# Patient Record
Sex: Female | Born: 1935 | Race: White | Hispanic: No | Marital: Married | Smoking: Former smoker
Health system: Southern US, Community
[De-identification: ages and names within clinical notes are randomized; demographics above are authoritative.]

## PROBLEM LIST (undated history)

## (undated) DIAGNOSIS — Z8719 Personal history of other diseases of the digestive system: Secondary | ICD-10-CM

## (undated) DIAGNOSIS — I10 Essential (primary) hypertension: Secondary | ICD-10-CM

## (undated) DIAGNOSIS — R269 Unspecified abnormalities of gait and mobility: Secondary | ICD-10-CM

## (undated) DIAGNOSIS — E7800 Pure hypercholesterolemia, unspecified: Secondary | ICD-10-CM

## (undated) DIAGNOSIS — M179 Osteoarthritis of knee, unspecified: Secondary | ICD-10-CM

## (undated) DIAGNOSIS — K219 Gastro-esophageal reflux disease without esophagitis: Secondary | ICD-10-CM

## (undated) DIAGNOSIS — F329 Major depressive disorder, single episode, unspecified: Secondary | ICD-10-CM

## (undated) DIAGNOSIS — D509 Iron deficiency anemia, unspecified: Secondary | ICD-10-CM

## (undated) DIAGNOSIS — F32A Depression, unspecified: Secondary | ICD-10-CM

## (undated) DIAGNOSIS — B0230 Zoster ocular disease, unspecified: Secondary | ICD-10-CM

## (undated) DIAGNOSIS — G4700 Insomnia, unspecified: Secondary | ICD-10-CM

## (undated) DIAGNOSIS — M5430 Sciatica, unspecified side: Secondary | ICD-10-CM

## (undated) DIAGNOSIS — M1710 Unilateral primary osteoarthritis, unspecified knee: Secondary | ICD-10-CM

## (undated) HISTORY — DX: Iron deficiency anemia, unspecified: D50.9

## (undated) HISTORY — PX: JOINT REPLACEMENT: SHX530

## (undated) HISTORY — DX: Zoster ocular disease, unspecified: B02.30

## (undated) HISTORY — DX: Depression, unspecified: F32.A

## (undated) HISTORY — DX: Essential (primary) hypertension: I10

## (undated) HISTORY — PX: TONSILLECTOMY: SUR1361

## (undated) HISTORY — PX: COLONOSCOPY: SHX174

## (undated) HISTORY — DX: Unspecified abnormalities of gait and mobility: R26.9

## (undated) HISTORY — DX: Major depressive disorder, single episode, unspecified: F32.9

## (undated) HISTORY — PX: EYE SURGERY: SHX253

## (undated) HISTORY — PX: HERNIA REPAIR: SHX51

---

## 1993-06-24 HISTORY — PX: ABDOMINAL HYSTERECTOMY: SHX81

## 1998-04-10 ENCOUNTER — Ambulatory Visit (HOSPITAL_BASED_OUTPATIENT_CLINIC_OR_DEPARTMENT_OTHER): Admission: RE | Admit: 1998-04-10 | Discharge: 1998-04-10 | Payer: Self-pay | Admitting: Orthopedic Surgery

## 1999-07-06 ENCOUNTER — Other Ambulatory Visit: Admission: RE | Admit: 1999-07-06 | Discharge: 1999-07-06 | Payer: Self-pay | Admitting: Internal Medicine

## 1999-12-31 ENCOUNTER — Ambulatory Visit (HOSPITAL_COMMUNITY): Admission: RE | Admit: 1999-12-31 | Discharge: 1999-12-31 | Payer: Self-pay | Admitting: Internal Medicine

## 1999-12-31 ENCOUNTER — Encounter: Admission: RE | Admit: 1999-12-31 | Discharge: 1999-12-31 | Payer: Self-pay | Admitting: Orthopedic Surgery

## 1999-12-31 ENCOUNTER — Encounter: Payer: Self-pay | Admitting: Orthopedic Surgery

## 2000-01-01 ENCOUNTER — Ambulatory Visit (HOSPITAL_BASED_OUTPATIENT_CLINIC_OR_DEPARTMENT_OTHER): Admission: RE | Admit: 2000-01-01 | Discharge: 2000-01-02 | Payer: Self-pay | Admitting: Orthopedic Surgery

## 2000-01-01 HISTORY — PX: SHOULDER ARTHROSCOPY WITH ROTATOR CUFF REPAIR AND SUBACROMIAL DECOMPRESSION: SHX5686

## 2000-03-04 ENCOUNTER — Encounter: Admission: RE | Admit: 2000-03-04 | Discharge: 2000-03-04 | Payer: Self-pay | Admitting: Internal Medicine

## 2000-03-04 ENCOUNTER — Encounter: Payer: Self-pay | Admitting: Internal Medicine

## 2000-06-27 ENCOUNTER — Ambulatory Visit (HOSPITAL_COMMUNITY): Admission: RE | Admit: 2000-06-27 | Discharge: 2000-06-27 | Payer: Self-pay | Admitting: Gastroenterology

## 2001-02-17 ENCOUNTER — Inpatient Hospital Stay (HOSPITAL_COMMUNITY): Admission: EM | Admit: 2001-02-17 | Discharge: 2001-02-18 | Payer: Self-pay | Admitting: Emergency Medicine

## 2001-02-17 ENCOUNTER — Encounter: Payer: Self-pay | Admitting: Internal Medicine

## 2001-03-30 ENCOUNTER — Encounter: Payer: Self-pay | Admitting: Surgery

## 2001-03-30 ENCOUNTER — Encounter: Admission: RE | Admit: 2001-03-30 | Discharge: 2001-03-30 | Payer: Self-pay | Admitting: Surgery

## 2001-05-11 ENCOUNTER — Ambulatory Visit (HOSPITAL_BASED_OUTPATIENT_CLINIC_OR_DEPARTMENT_OTHER): Admission: RE | Admit: 2001-05-11 | Discharge: 2001-05-11 | Payer: Self-pay | Admitting: Orthopedic Surgery

## 2001-05-11 HISTORY — PX: KNEE ARTHROSCOPY: SHX127

## 2001-07-03 ENCOUNTER — Inpatient Hospital Stay (HOSPITAL_COMMUNITY): Admission: RE | Admit: 2001-07-03 | Discharge: 2001-07-06 | Payer: Self-pay | Admitting: Surgery

## 2001-07-03 ENCOUNTER — Encounter (INDEPENDENT_AMBULATORY_CARE_PROVIDER_SITE_OTHER): Payer: Self-pay | Admitting: Specialist

## 2001-07-03 HISTORY — PX: LAPAROSCOPIC NISSEN FUNDOPLICATION: SHX1932

## 2001-07-04 ENCOUNTER — Encounter: Payer: Self-pay | Admitting: Surgery

## 2002-02-18 ENCOUNTER — Encounter: Payer: Self-pay | Admitting: Internal Medicine

## 2002-02-18 ENCOUNTER — Ambulatory Visit (HOSPITAL_COMMUNITY): Admission: RE | Admit: 2002-02-18 | Discharge: 2002-02-18 | Payer: Self-pay | Admitting: Internal Medicine

## 2003-08-24 ENCOUNTER — Encounter: Admission: RE | Admit: 2003-08-24 | Discharge: 2003-08-24 | Payer: Self-pay | Admitting: Internal Medicine

## 2004-05-14 ENCOUNTER — Ambulatory Visit: Payer: Self-pay | Admitting: Physical Medicine & Rehabilitation

## 2004-05-14 ENCOUNTER — Inpatient Hospital Stay (HOSPITAL_COMMUNITY): Admission: RE | Admit: 2004-05-14 | Discharge: 2004-05-19 | Payer: Self-pay | Admitting: Orthopedic Surgery

## 2004-05-14 HISTORY — PX: TOTAL KNEE ARTHROPLASTY: SHX125

## 2007-04-08 ENCOUNTER — Other Ambulatory Visit: Admission: RE | Admit: 2007-04-08 | Discharge: 2007-04-08 | Payer: Self-pay | Admitting: Internal Medicine

## 2007-07-17 ENCOUNTER — Encounter: Admission: RE | Admit: 2007-07-17 | Discharge: 2007-07-17 | Payer: Self-pay | Admitting: Orthopedic Surgery

## 2007-07-21 ENCOUNTER — Ambulatory Visit (HOSPITAL_BASED_OUTPATIENT_CLINIC_OR_DEPARTMENT_OTHER): Admission: RE | Admit: 2007-07-21 | Discharge: 2007-07-21 | Payer: Self-pay | Admitting: Orthopedic Surgery

## 2007-07-21 HISTORY — PX: CARPAL TUNNEL RELEASE: SHX101

## 2007-07-21 HISTORY — PX: TRIGGER FINGER RELEASE: SHX641

## 2007-09-24 ENCOUNTER — Ambulatory Visit (HOSPITAL_BASED_OUTPATIENT_CLINIC_OR_DEPARTMENT_OTHER): Admission: RE | Admit: 2007-09-24 | Discharge: 2007-09-24 | Payer: Self-pay | Admitting: Orthopedic Surgery

## 2007-09-24 HISTORY — PX: CARPAL TUNNEL RELEASE: SHX101

## 2007-09-24 HISTORY — PX: TRIGGER FINGER RELEASE: SHX641

## 2008-03-10 ENCOUNTER — Ambulatory Visit (HOSPITAL_BASED_OUTPATIENT_CLINIC_OR_DEPARTMENT_OTHER): Admission: RE | Admit: 2008-03-10 | Discharge: 2008-03-10 | Payer: Self-pay | Admitting: Orthopedic Surgery

## 2008-03-10 HISTORY — PX: TRIGGER FINGER RELEASE: SHX641

## 2010-08-13 ENCOUNTER — Other Ambulatory Visit: Payer: Self-pay | Admitting: Neurosurgery

## 2010-08-13 DIAGNOSIS — M545 Low back pain: Secondary | ICD-10-CM

## 2010-08-15 ENCOUNTER — Ambulatory Visit
Admission: RE | Admit: 2010-08-15 | Discharge: 2010-08-15 | Disposition: A | Discharge status: Home / Self Care | Payer: Medicare Other | Source: Ambulatory Visit | Attending: Neurosurgery | Admitting: Neurosurgery

## 2010-08-15 DIAGNOSIS — M545 Low back pain: Secondary | ICD-10-CM

## 2010-08-15 MED ORDER — GADOBENATE DIMEGLUMINE 529 MG/ML IV SOLN
13.0000 mL | Freq: Once | INTRAVENOUS | Status: AC | PRN
  Administered 2010-08-15: 13 mL via INTRAVENOUS

## 2010-08-27 ENCOUNTER — Ambulatory Visit (HOSPITAL_COMMUNITY)
Admission: RE | Admit: 2010-08-27 | Discharge: 2010-08-27 | Disposition: A | Discharge status: Home / Self Care | Payer: Medicare Other | Source: Ambulatory Visit | Attending: Neurosurgery | Admitting: Neurosurgery

## 2010-08-27 ENCOUNTER — Encounter (HOSPITAL_COMMUNITY)
Admission: RE | Admit: 2010-08-27 | Discharge: 2010-08-27 | Disposition: A | Discharge status: Home / Self Care | Payer: Medicare Other | Source: Ambulatory Visit | Attending: Neurosurgery | Admitting: Neurosurgery

## 2010-08-27 ENCOUNTER — Other Ambulatory Visit (HOSPITAL_COMMUNITY): Payer: Self-pay | Admitting: Neurosurgery

## 2010-08-27 DIAGNOSIS — Z01818 Encounter for other preprocedural examination: Secondary | ICD-10-CM | POA: Insufficient documentation

## 2010-08-27 DIAGNOSIS — Z01810 Encounter for preprocedural cardiovascular examination: Secondary | ICD-10-CM | POA: Insufficient documentation

## 2010-08-27 DIAGNOSIS — Z01812 Encounter for preprocedural laboratory examination: Secondary | ICD-10-CM | POA: Insufficient documentation

## 2010-08-27 DIAGNOSIS — M48061 Spinal stenosis, lumbar region without neurogenic claudication: Secondary | ICD-10-CM

## 2010-08-27 LAB — CBC
HCT: 38.6 % (ref 36.0–46.0)
Hemoglobin: 13.3 g/dL (ref 12.0–15.0)
MCH: 31.9 pg (ref 26.0–34.0)
MCHC: 34.5 g/dL (ref 30.0–36.0)
MCV: 92.6 fL (ref 78.0–100.0)
Platelets: 311 10*3/uL (ref 150–400)
RBC: 4.17 MIL/uL (ref 3.87–5.11)
RDW: 12.6 % (ref 11.5–15.5)
WBC: 8.1 10*3/uL (ref 4.0–10.5)

## 2010-08-27 LAB — BASIC METABOLIC PANEL
BUN: 13 mg/dL (ref 6–23)
CO2: 25 mEq/L (ref 19–32)
Calcium: 9.5 mg/dL (ref 8.4–10.5)
Chloride: 101 mEq/L (ref 96–112)
Creatinine, Ser: 0.63 mg/dL (ref 0.4–1.2)
GFR calc Af Amer: >60 mL/min (ref >60)
GFR calc non Af Amer: >60 mL/min (ref >60)
Glucose, Bld: 113 mg/dL — ABNORMAL HIGH (ref 70–99)
Potassium: 4.8 mEq/L (ref 3.5–5.1)
Sodium: 136 mEq/L (ref 135–145)

## 2010-08-27 LAB — SURGICAL PCR SCREEN
MRSA, PCR: NEGATIVE
Staphylococcus aureus: NEGATIVE

## 2010-08-29 ENCOUNTER — Inpatient Hospital Stay (HOSPITAL_COMMUNITY): Payer: Medicare Other

## 2010-08-29 ENCOUNTER — Inpatient Hospital Stay (HOSPITAL_COMMUNITY)
Admission: RE | Admit: 2010-08-29 | Discharge: 2010-09-04 | DRG: 491 | Disposition: A | Discharge status: Skilled Nursing Facility | Payer: Medicare Other | Source: Ambulatory Visit | Attending: Neurosurgery | Admitting: Neurosurgery

## 2010-08-29 DIAGNOSIS — M51379 Other intervertebral disc degeneration, lumbosacral region without mention of lumbar back pain or lower extremity pain: Principal | ICD-10-CM | POA: Diagnosis present

## 2010-08-29 DIAGNOSIS — M48062 Spinal stenosis, lumbar region with neurogenic claudication: Secondary | ICD-10-CM | POA: Diagnosis present

## 2010-08-29 DIAGNOSIS — I10 Essential (primary) hypertension: Secondary | ICD-10-CM | POA: Diagnosis present

## 2010-08-29 DIAGNOSIS — M5137 Other intervertebral disc degeneration, lumbosacral region: Principal | ICD-10-CM | POA: Diagnosis present

## 2010-08-29 DIAGNOSIS — K219 Gastro-esophageal reflux disease without esophagitis: Secondary | ICD-10-CM | POA: Diagnosis present

## 2010-08-29 HISTORY — PX: LUMBAR LAMINECTOMY/DECOMPRESSION MICRODISCECTOMY: SHX5026

## 2010-08-31 LAB — URINALYSIS, ROUTINE W REFLEX MICROSCOPIC
Bilirubin Urine: NEGATIVE
Glucose, UA: NEGATIVE mg/dL
Ketones, ur: NEGATIVE mg/dL
Nitrite: NEGATIVE
Protein, ur: NEGATIVE mg/dL
Specific Gravity, Urine: 1.023 (ref 1.005–1.030)
Urobilinogen, UA: 0.2 mg/dL (ref 0.0–1.0)
pH: 5.5 (ref 5.0–8.0)

## 2010-08-31 LAB — URINE MICROSCOPIC-ADD ON

## 2010-09-01 LAB — URINE CULTURE
Colony Count: NO GROWTH
Culture  Setup Time: 201203091512
Culture: NO GROWTH
Special Requests: NEGATIVE

## 2010-09-05 NOTE — H&P (Signed)
NAME:  Kristin Solomon, Kristin Solomon           ACCOUNT NO.:  [id number]  MEDICAL RECORD NO.:  [id number]           PATIENT TYPE:  I  LOCATION:  3013                         FACILITY:  [facility]  PHYSICIAN:  Hewitt Shorts, M.D.DATE OF BIRTH:  1929-07-08  DATE OF ADMISSION:  08/29/2010 DATE OF DISCHARGE:                             HISTORY & PHYSICAL   HISTORY OF PRESENT ILLNESS:  The patient is a 77 year old right-hand white female whom I have followed for the past year and half for neurogenic claudication whom we first saw on August 2010.  She returned last month because of worsening neurogenic claudication with pain in the buttocks and posterior thighs bilaterally.  She does not have a lot of back pain.  She was treated with prednisone Dosepak without relief, treated with 2 epidural steroid injections which gave her no relief, treated with anti-inflammatory medications without improvement and so she was restarted with an updated MRI scan which shows multilevel multifactorial lumbar stenosis, moderate at L2-3 and L3-4, marked to severe at L4-5, and marked at L5-S1 and shows mild anterolisthesis of L3- 4 and L4-5.  The patient admitted now for decompressive lumbar laminectomy.  PAST MEDICAL HISTORY:  Notable for history of iron-deficiency anemia as well as hiatal hernia.  She had surgical repair of the hiatal hernia in 2003.  She does not describe any history of hypertension, myocardial infarction, cancer, stroke, diabetes, or lung disease.  PREVIOUS SURGICAL HISTORY:  Vaginal hysterectomy in 1996, repair of hiatal hernia in 2003, left knee replacement in 2005.  She denies allergy to medications.  CURRENT MEDICATIONS: 1. Ferrous sulfate 325 mg daily. 2. Muro 128 ophthalmologic 5% ointment in the eyes b.i.d. 3. Venlafaxine 75 mg at bedtime. 4. Prilosec 20 mg b.i.d. 5. Vitamin E 400 international units q.a.m. 6. Aspirin 81 mg q.a.m. 7. Lopressor 25 mg b.i.d. 8. Crestor 10 mg  q.a.m.  FAMILY HISTORY:  Parents have passed on.  Father with Parkinson's, died at age 84.  Mother died at 65.  SOCIAL HISTORY:  The patient is married.  She is retired.  She does not smoke, drink alcoholic beverages or has history of substance abuse.  REVIEW OF SYSTEMS:  Notable for those described in the history of present illness and past medical history, but 14-point review of systems is otherwise unremarkable.  PHYSICAL EXAMINATION:  GENERAL:  The patient is a well-developed, well- nourished white female in no acute distress. VITAL SIGNS:  Temperature 98, pulse 86, blood pressure 133/80, respiratory rate 20, height 5 feet 2 inches, weight 142 pounds. LUNGS:  Clear to auscultation.  She has symmetric respiratory excursions. HEART:  Regular rate and rhythm with S1 and S2.  There is no murmur. EXTREMITIES:  No clubbing, cyanosis, or edema. MUSCULOSKELETAL:  No tenderness to palpation over the lumbar spinous process or paralumbar musculature.  She is able to flex to 90 degrees, able to extend to 10 degrees.  Straight-leg raising is negative bilaterally. NEUROLOGIC:  Strength 5/5 in the lower extremities including the iliopsoas, quadriceps, dorsiflexors, extensor hallucis longus, and plantar flexion bilaterally.  Sensation is intact to pinprick to the upper and lower extremities.  Reflexes are 2 at  the quadriceps, 1 at the gastrocnemius symmetrical bilaterally.  Toes are downgoing bilaterally. She has a normal gait and stance.  IMPRESSION:  Multilevel multifactorial lumbar stenosis with resulting neurogenic claudication in a patient with spondylosis and degenerative disk disease.  PLAN:  The patient will be admitted for a multilevel decompressive lumbar laminectomy.  We discussed alternatives to surgery, nature of surgical procedure, typical length of surgery, hospital stay, and overall recuperation and limitations postoperatively, recommendation for postoperative  immobilization in a lumbar brace and risks of including risks of infection, bleeding, possible need for transfusion, risk of nerve dysfunction with pain, weakness, numbness, or paresthesias, the risk of dural tear and CSF leakage, possible need of further surgery, the risk of instability of spine and possible need for further surgery and anesthetic risk, myocardial infarction, stroke, pneumonia, and death.  Understanding all of this, she would like to go ahead with surgery and is admitted for such.     Hewitt Shorts, M.D.     RWN/MEDQ  D:  08/29/2010  T:  08/30/2010  Job:  [id number]  Electronically Signed by Shirlean Kelly M.D. on 09/05/2010 10:33:20 AM

## 2010-09-05 NOTE — Op Note (Signed)
NAME:  Kristin Solomon, Kristin Solomon           ACCOUNT NO.:  [id number]  MEDICAL RECORD NO.:  [id number]           PATIENT TYPE:  I  LOCATION:  3013                         FACILITY:  [facility]  PHYSICIAN:  Hewitt Shorts, M.D.DATE OF BIRTH:  17-Mar-1930  DATE OF PROCEDURE:  08/29/2010 DATE OF DISCHARGE:                              OPERATIVE REPORT   PREOPERATIVE DIAGNOSIS:  Lumbar stenosis, lumbar spondylosis, lumbar degenerative disk disease, neurogenic claudication.  POSTOPERATIVE DIAGNOSIS:  Lumbar stenosis, lumbar spondylosis, lumbar degenerative disk disease, neurogenic claudication.  PROCEDURE:  L2-S1 decompressive lumbar laminectomy with microdissection microsurgical technique with decompression of the exiting L2, L3, L4, L5, and S1 nerve roots bilaterally.  SURGEON:  Hewitt Shorts, MD  ASSISTANT:  Coletta Memos, MD.  ANESTHESIA:  General endotracheal.  INDICATIONS:  The patient is a 77 year old woman who presented with progressive neurogenic claudication who was found to have multilevel multifactorial lumbar stenosis.  Decision was made to proceed with decompression and arthrodesis.  PROCEDURE:  The patient was brought to the operating room, placed on general endotracheal anesthesia.  The patient was turned to prone position.  Lumbar region was prepped with Betadine soap and solution and draped in sterile fashion.  A vertical incision was made over the lumbar midline.  The line of the incision was infiltrated with local anesthetic with epinephrine.  Dissection was carried down through the subcutaneous tissue to the deep fascia which was incised bilaterally and the paraspinal musculature was dissected into the spinous process and lamina in subperiosteal fashion.  Self-retaining retractor was placed and x-ray was taken and the L2, L3, L4, L5, and S1 spinous process and lamina were identified.  Using magnification, microdissection, microsurgical technique, we proceeded  with a laminectomy using double-action rongeurs, the high- speed drill, and Kerrison punches.  Edge of the bone were waxed as needed to maintain hemostasis.  The lamina was thinned down with the drill and then the last inner cortical surface was removed using Kerrison punches taking care to leave the underlying dural sac undisturbed.  There was marked thickening of the ligamentum flavum at each level and this was carefully removed from the central canal to decompress the spinal canal and thecal sac.  We then turned our attention to the lateral recesses and the hypertrophic ligamentum flavum laterally.  This was carefully removed decompressing the lateral aspect of the thecal sac and the exiting nerve roots.  We were able to decompress the lateral recess and the nerve roots as they entered into the neural foramen including the L2, L3, L4, L5, and S1 nerve roots bilaterally.  Once decompression was completed, hemostasis was established using Gelfoam soaked in thrombin.  The wound was irrigated numerous times during the procedure with saline and bacitracin solution.  The paraspinal muscles were approximated with interrupted undyed 1-0 Vicryl sutures, deep fascia closed with interrupted undyed 1-0 Vicryl sutures. Scarpa fascia closed with interrupted inverted 2-0 undyed Vicryl sutures.  Subcutaneous and subcuticular were closed with interrupted inverted 2-0 undyed Vicryl sutures.  Skin edges were approximated with Dermabond.  Wound was dressed with sterile gauze and 4-inch Hypafix. Procedure was tolerated well.  The estimated blood loss was  150 mL. Sponge and needle count were correct.  Following the surgery, the patient needed to be reversed from the anesthetic, extubated, and transferred to the recovery room for further care.     Hewitt Shorts, M.D.     RWN/MEDQ  D:  08/29/2010  T:  08/30/2010  Job:  [id number]  Electronically Signed by Shirlean Kelly M.D. on 09/05/2010  10:33:18 AM

## 2010-09-05 NOTE — Discharge Summary (Addendum)
  NAME:  Kristin Solomon, DRESSEL NO.:  [id number]  MEDICAL RECORD NO.:  [id number]           PATIENT TYPE:  LOCATION:                                 FACILITY:  PHYSICIAN:  Hewitt Shorts, M.D.DATE OF BIRTH:  March 18, 1930  DATE OF ADMISSION:  08/29/2010 DATE OF DISCHARGE:  09/04/2010                              DISCHARGE SUMMARY   The patient had been arranged for discharge several days ago and a previous discharge summary have been dictated.  This is an addendum to that summary.  There are no changes to the admission history and physical examination, however, the addition to the hospital course.  The patient did develop some mild fever to 100.  We obtained urinalysis and urine culture.  The UA showed 3-6 white blood cells and few bacteria. Urine culture was pending and the patient was started on Cipro 500 mg b.i.d.  After 48 hours the urine culture showed no growth and after total of 3 days of Cipro, the antibiotic was discontinued due to the culture being negative.  The patient continued to undergo physical therapy and occupational therapy.  However, she had periods over the past several days of mild confusion and disorientation.  It was felt to be primarily related to her medications and we were able to steadily decrease the amount of medications that she was being given.  Her pain medication was changed to Vicodin 1 tablet q.4 h. p.r.n. pain and she was changed from Flexeril to Robaxin 500 mg p.o. t.i.d. p.r.n. muscle spasms.  In fact she has used enough of each of these medications and our recommendation at the time of discharge is for Vicodin 1 tablet q.i.d. p.r.n. pain.  We prescribed a total of 20 tablets and no refills and Robaxin 5 mg b.i.d. p.r.n. muscle spasm was prescribed 20 tablets and no refills.  With that her sensorium has been more clear.  She is fully oriented.  She is able to transfer, ambulate with a rolling walker.  Her wound is healing  well. She is able to shower with assistance.  The glue on the incision should come off within the next week.  The previous prescriptions for Percocet and Flexeril had been destroyed.  The previous prescription for ciprofloxacin has been destroyed.  She should follow up with me in the office in about 4 weeks.  She was originally going to be transferred to [facility] skilled nursing facility.  However, there apparently developed a viral outbreak and therefore we looked into other facilities.  [facility] lives [facility] and we were able to find a [facility] and the patient is being transferred there today. She is afebrile.  Her wound is healing well.  No other changes to the previous discharge summary.     Hewitt Shorts, M.D.   ______________________________ Hewitt Shorts, M.D.    RWN/MEDQ  D:  09/04/2010  T:  09/04/2010  Job:  [id number]  Electronically Signed by Shirlean Kelly M.D. on 09/17/2010 07:29:11 AM

## 2010-09-05 NOTE — Discharge Summary (Signed)
NAME:  Kristin Solomon, Kristin Solomon           ACCOUNT NO.:  [id number]  MEDICAL RECORD NO.:  [id number]           PATIENT TYPE:  I  LOCATION:  3013                         FACILITY:  [facility]  PHYSICIAN:  Hewitt Shorts, M.D.DATE OF BIRTH:  August 01, 1929  DATE OF ADMISSION:  08/29/2010 DATE OF DISCHARGE:  09/01/2010                              DISCHARGE SUMMARY   ADMISSION HISTORY AND PHYSICAL EXAMINATION:  The patient is a 77 year old woman who presented with neurogenic claudication with pain in the back radiating to the buttocks and posterior thighs, which did not respond to nonsurgical measures such as epidural steroid injections, anti-inflammatory medications,  and so on.  MRI scan showed multilevel multifactorial lumbar stenosis.  The decision was made to proceed with an L2-S1 decompressive lumbar laminectomy.  PAST HISTORY:  Notable for iron deficiency anemia as well as hiatal hernia.  MEDICATIONS PRIOR TO ADMISSION: 1. Ferrous sulfate 325 mg daily. 2. Muro 128 ophthalmologic 5% ointment in the eyes b.i.d. 3. Venlafaxine 75 mg at bedtime. 4. Prilosec 20 mg b.i.d. 5. Vitamin E 400 international units q.a.m. 6. Aspirin 81 mg q.a.m. 7. Lopressor 25 mg b.i.d. 8. Crestor 10 mg q.a.m.  PHYSICAL EXAMINATION:  GENERAL:  Unremarkable. NEUROLOGIC:  Intact strength and sensation.  Checked her mobility.  In the low back, able to flex 90 degrees and able to extend it 10 degrees.  HOSPITAL COURSE:  The patient was admitted, underwent an L2-S1 decompressive lumbar laminectomy.  She has done well following surgery, gradually becoming more comfortable.  She has had a mild fever to 100.0. We had sent off a urinalysis and urine culture, which are pending at this time.  She has been started on incentive spirometry.  Her dressings have been removed and the wound has healed nicely.  There is no erythema, swelling, or drainage.  She has been seen in consultation by physical therapy and  occupational therapy.  She has been making progress with transfers, ambulation, and ADLs, and she has requested transfer to a skilled nursing facility, specifically [facility] where her husband resides in the Alzheimer's unit.  Arrangement has been made by the patient and the case management service for that transfer on September 01, 2010.  However, the orders are specific that her urinalysis and urine culture to be reviewed by Dr. Venetia Maxon who will be on-call for my practice on the 10th and the temperature chart will be reviewed by him.  If there are no problems with those, then she will be released and transferred to [facility] for brief course of skilled nursing facility rehabilitation, to be subsequently discharged to home after a week.  She has been given prescriptions for Percocet 1-2 tablets p.o. q.4-6 h. p.r.n. pain, 60 tablets, no refills and Flexeril 10 mg t.i.d. p.r.n., muscle spasms, 90 tablets, no refills.  She is to resume all of her other usual home medications.  DISCHARGE DIAGNOSES:  Lumbar stenosis and neurogenic claudication.  DISCHARGE FOLLOWUP:  She is to return to my office for followup in about 3 weeks and to let us know if she needs anything else in the meantime. She had been given instructions regarding wound care  and activities following discharge.  She is wearing a lumbar brace when she is up and about, and she dons and doffs that when up and out of bed.     Hewitt Shorts, M.D.     RWN/MEDQ  D:  08/31/2010  T:  08/31/2010  Job:  [id number]  Electronically Signed by Shirlean Kelly M.D. on 09/05/2010 10:33:25 AM

## 2010-11-06 NOTE — Op Note (Signed)
NAME:  Kristin Solomon, Kristin Solomon           ACCOUNT NO.:  [id number]   MEDICAL RECORD NO.:  [id number]          PATIENT TYPE:  AMB   LOCATION:  DSC                          FACILITY:  [facility]   PHYSICIAN:  Cindee Salt, M.D.       DATE OF BIRTH:  [birth year]   DATE OF PROCEDURE:  09/24/2007  DATE OF DISCHARGE:                               OPERATIVE REPORT   PREOPERATIVE DIAGNOSES:  1. Carpal tunnel syndrome left hand.  2. Stenosing tenosynovitis left middle finger.   POSTOPERATIVE DIAGNOSES:  1. Carpal tunnel syndrome left hand.  2. Stenosing tenosynovitis left middle finger.   OPERATION:  1. Release of A1 pulley left middle finger.  2. Carpal tunnel release left hand.   SURGEON:  Cindee Salt, M.D.   ASSISTANT:  Carolyne Fiscal R.N.   ANESTHESIA:  Forearm based IV regional.   ANESTHESIOLOGIST:  Zenon Mayo, MD.   HISTORY:  The patient is a 77 year old female with a history of carpal  tunnel syndrome, EMG nerve conductions positive.  She also has  triggering of her left middle finger which has not responded to  conservative treatment.  She is desirous of undergoing release of each  of these structures.  She is aware of risks and complications including  infection, recurrence, injury to arteries, nerves, tendons, incomplete  relief of symptoms, dystrophy.  In the preoperative area the patient is  seen questions again encouraged and answered.  Antibiotic given.  The  extremity marked by both the patient and surgeon.   PROCEDURE:  The patient was brought to the operating room where forearm  based IV regional anesthetic was carried out without difficulty under  the direction of Dr. Sampson Goon.  She was prepped using DuraPrep, supine  position, left arm free.  A time-out was taken, revealing the procedure.  An incision was made longitudinally in the palm, carried down through  subcutaneous tissue.  Bleeders were electrocauterized.  The palmar  fascia was split, superficial palmar arch  identified.  The flexor tendon  to the ring and little finger identified.  To the ulnar side of median  nerve carpal retinaculum was incised with sharp dissection.  A right-  angle and Sewall retractor were placed between skin and forearm fascia.  The fascia released for approximately 1.5 cm proximal to the wrist  crease under direct vision.  The canal was explored.  Area of  compression to the nerve was apparent.  No further lesions were  identified.  The wound was irrigated.  The skin closed with interrupted  5-0 Vicryl Rapide sutures oblique incision was then made over the A1  pulley of the left middle finger.  Again, carried down through  subcutaneous tissue.  Bleeders again electrocauterized.  Neurovascular  structures identified and protected with retractors.  An incision was  then made on the radial aspect of the A1 pulley.  A small incision was  made centrally in the A2 pulley, the finger placed through a full range  motion.  The patient was actually able to flex and extend, no further  triggering was noted.  The wound was irrigated.  The skin was then  closed with interrupted 5-0 Vicryl Rapide sutures.  A sterile  compressive dressing and splint to the wrist applied with the fingers  free.  The patient tolerated the procedure well and was taken to the  recovery room for observation in satisfactory condition.  She will be  discharged home to return to the [facility] [place] in one week  on Vicodin.           ______________________________  Cindee Salt, M.D.     GK/MEDQ  D:  09/24/2007  T:  09/24/2007  Job:  [id number]   cc:   Candyce Churn, M.D.

## 2010-11-06 NOTE — Op Note (Signed)
NAME:  Kristin Solomon, Kristin Solomon           ACCOUNT NO.:  [id number]   MEDICAL RECORD NO.:  [id number]          PATIENT TYPE:  AMB   LOCATION:  DSC                          FACILITY:  [facility]   PHYSICIAN:  Cindee Salt, M.D.       DATE OF BIRTH:  06/05/1930   DATE OF PROCEDURE:  07/21/2007  DATE OF DISCHARGE:                               OPERATIVE REPORT   PREOPERATIVE DIAGNOSIS:  Carpal tunnel syndrome right hand, stenosing  tenosynovitis right thumb.   POSTOPERATIVE DIAGNOSIS:  Carpal tunnel syndrome right hand, stenosing  tenosynovitis right thumb.   OPERATION:  Release A1 pulley right thumb, release right carpal tunnel.   SURGEON:  Cindee Salt, M.D.   ASSISTANT:  None.   ANESTHESIA:  IV regional forearm based with supplementation.   HISTORY:  The patient is a 77 year old female with a history of carpal  tunnel syndrome, EMG nerve conductions positive.  She is desirous having  this relief.  She also has triggering of her thumb which has not  responded to conservative treatment.  She is aware of risks and  complications including infection, recurrence, injury to arteries,  nerves, tendons, incomplete relief of symptoms, dystrophy.  Preoperative  area the patient is seen.  The extremity marked by both the patient and  surgeon.  Antibiotic given.   PROCEDURE:  The patient is brought to the operating room where forearm  based IV regional anesthetic was carried out without difficulty.  She  was prepped using DuraPrep, supine position with the right arm free.  A  longitudinal incision was made in the palm carried down through  subcutaneous tissue.  Bleeders were electrocauterized, palmar fascia  split, superficial palmar arch identified.  The flexor tendon to the  ring and little finger identified to the ulnar side of the median nerve.  The carpal retinaculum was incised with sharp dissection.  A right-angle  and Sewall retractor were placed between skin and forearm fascia.  The  fascia  released for approximately a centimeter and a half proximal to  the wrist crease under direct vision.  Canal was explored.  Area of  compression to the nerve was apparent.  No further lesions were  identified.  The wound was irrigated and closed with interrupted 5-0  Vicryl Rapide sutures.  It was noted that she had feeling.  Local  infiltration was given prior to completion of the skin incision.  The  area of the A1 pulley of the thumb was also injected at the same time  that the carpal tunnel was.  Transverse incision was made, carried down  through subcutaneous tissue.  The radial and ulnar digital neurovascular  bundles identified to the radial side of the A1 pulley.  An incision was  made.  Significant compression to the flexor tendon was immediately  noted.  The oblique pulley was left intact.  The thumb placed through  full range motion, no further adherence triggering was noted.  The wound  was irrigated and closed with interrupted 5-0 Vicryl Rapide  sutures.  A sterile compressive dressing and splint to the wrist  applied.  The patient tolerated the procedure  well and was taken to the  recovery room for observation in satisfactory condition.  She will be  discharged home to return to [facility] [place] in one week on  Vicodin.           ______________________________  Cindee Salt, M.D.     GK/MEDQ  D:  07/21/2007  T:  07/21/2007  Job:  [id number]   cc:   Candyce Churn, M.D.

## 2010-11-06 NOTE — Op Note (Signed)
[name], Kristin Solomon           ACCOUNT NO.:  [id number]   MEDICAL RECORD NO.:  [id number]          PATIENT TYPE:  AMB   LOCATION:  DSC                          FACILITY:  [facility]   PHYSICIAN:  Cindee Salt, M.D.       DATE OF BIRTH:  10-11-1929   DATE OF PROCEDURE:  03/10/2008  DATE OF DISCHARGE:                               OPERATIVE REPORT   PREOPERATIVE DIAGNOSIS:  Stenosing tenosynovitis right middle, right  ring, right little fingers.   POSTOPERATIVE DIAGNOSIS:  Stenosing tenosynovitis right middle, right  ring, right little fingers.   OPERATION:  Release A1 pulley right middle, right ring, right little  fingers.   SURGEON:  Cindee Salt, MD   ASSISTANT:  Carolyne Fiscal RN   ANESTHESIA:  Forearm based IV regional.   ANESTHESIOLOGIST:  Zenon Mayo, MD.   HISTORY:  The patient is a 76 year old female with a history of  triggering of right middle, right ring, and right little finger.  This  has not responded to conservative treatment.  She has elected to undergo  surgical decompression.   Pre, peri, and postoperative course have been discussed along with the  risks and complications.  She is aware that there is no guarantee with  the surgery, possibility of infection, recurrence, injury to arteries,  nerves, or tendons, complete relief of symptoms, and dystrophy.  Preoperative area, the patient is seen.  The extremity marked by both  the patient and surgeon.  Antibiotics given.   PROCEDURE:  The patient was brought to the operating room, where a  forearm based IV regional anesthetic was carried out without difficulty.  She was prepped using DuraPrep, supine position with the right arm free.  A time-out was taken.  An oblique incision was made over each one of the  A1 pulleys.  She did have some filling, and the local area was  infiltrated with 1% Xylocaine without epinephrine, 3-4 mL was used.  The  wounds were then deepened with blunt and sharp dissection.  Retractors  were placed to retract the neurovascular structures on the radial  aspect.  The A1 pulleys each was released.  The small incision was made  centrally in the A2 pulley.  A moderate tenosynovitis was present.  These were each debrided on each of the fingers.  The right ring and  little fingers were subsequently performed in a very similar manner  releasing the A1 pulley of the right middle, right ring, and right  little fingers with partial synovectomy.  The wounds were then copiously  irrigated with saline.  The skin was then  closed with interrupted 5-0 Vicryl Rapide sutures.  Sterile compressive  dressing was applied. Motion of fingers, no further triggering was  noted.  The patient was taken to the recovery room for observation in  satisfactory condition.  She will be discharged to home to return to the  [facility] in 1 week on Vicodin.           ______________________________  Cindee Salt, M.D.     GK/MEDQ  D:  03/10/2008  T:  03/11/2008  Job:  [id number]  cc:   Duncan Dull, M.D.

## 2010-11-09 NOTE — Procedures (Signed)
[facility]  Patient:    Kristin Solomon, Kristin Solomon Visit Number: [id number] MRN: [id number]          Service Type: MED Location: [phone] Attending Physician:  Lillia Mountain Dictated by:   Florencia Reasons, M.D. Proc. Date: 02/17/01 Adm. Date:  02/17/2001 Disc. Date: 02/18/2001   CC:         Thora Lance, M.D.   Procedure Report  PROCEDURE:  Upper endoscopy.  INDICATION:  A 77 year old female with a one-day history of several melanic stools, not associated with any significant orthostatic symptomatology.  She is on a daily aspirin.  FINDINGS:  Large hiatal hernia with Sheria Lang erosions.  No active bleeding but some limited old blood present in the stomach.  DESCRIPTION OF PROCEDURE:  The nature, purpose, and risks of the procedure had been discussed with the patient, who provided written consent.  Sedation was fentanyl 50 mcg and Versed 5 mg IV without arrhythmias or desaturation.  The Olympus GIF-160 adult small-caliber video endoscope was passed under direct vision.  The vocal cords and larynx looked normal.  The esophagus was quite easily entered and had normal mucosa throughout its entire length, specifically without evidence of any Mallory-Weiss tear, reflux esophagitis, Barretts esophagus, varices, infection, or neoplasia.  The squamocolumnar junction and lower esophageal sphincter were located at 28 cm from the mouth, and below this was a 9 cm hiatal hernia with the diaphragmatic hiatus at about 37 cm.  The hiatal hernia was even larger in transverse dimension than in longitudinal dimension, and as such it is estimated that about a third of the stomach was up in the chest.  The abdominal portion of the stomach was entered.  It contained some strands of mucoid blood and coffee-grounds material but no fresh blood, no clots, no significant coffee-grounds.  Careful inspection of the diaphragmatic hiatus showed some shallow  erosions in a linear fashion, consistent with Cameron erosions, and at least one to two of these had a slightly dirty base.  The antrum was free of any aspirin-induced ulcerations or erosions, and the pylorus, duodenal bulb, and the second duodenum looked normal.  Retroflex viewing showed a very patulous diaphragmatic hiatus, estimated to be about 10 cm across.  The scope was then removed from the patient, who tolerated the procedure well and without apparent complication.  IMPRESSION:  Large hiatal hernia with Sheria Lang erosions, which are the presumed source of bleeding, especially in view of an apparent stigma of recent hemorrhage.  DISCUSSION AND PLAN:  The patient is at low risk for any significant further bleeding and may safely be treated as an outpatient after a period of overnight observation, presuming she remains stable in the meantime as I anticipate she will.  I would favor antipeptic therapy for perhaps two to four weeks, but it is not clear that long-term antipeptic therapy will alter the natural history of these lesions, so if recurrent GI bleeding becomes a problem for this patient, I would actually consider surgical repair of the hiatal hernia. Dictated by:   Florencia Reasons, M.D. Attending Physician:  Lillia Mountain DD:  02/17/01 TD:  02/18/01 Job: [id number]

## 2010-11-09 NOTE — Op Note (Signed)
[facility]  Patient:    Kristin Solomon, BUCKS Visit Number: [id number] MRN: [id number]          Service Type: SUR Location: [id number] [phone] [id number] Attending Physician:  Katha Cabal Dictated by:   Thornton Park Daphine Deutscher, M.D. Proc. Date: 07/03/01 Admit Date:  07/03/2001   CC:         Thora Lance, M.D.  Florencia Reasons, M.D.   Operative Report  CCS NUMBER:  [id number]  PREOPERATIVE DIAGNOSIS:  Giant type 3 hiatal hernia with Sheria Lang ulcers.  POSTOPERATIVE DIAGNOSIS:  Giant type 3 hiatal hernia with Sheria Lang ulcers.  PROCEDURE:  Laparoscopic repair of giant hiatal hernia with laparoscopic Nissen fundoplication.  SURGEON:  Thornton Park. Daphine Deutscher, M.D.  ASSISTANT:  Abigail Miyamoto, M.D.  ANESTHESIA:  General endotracheal.  DESCRIPTION OF PROCEDURE:  The patient was taken to room #1 on Friday, July 03, 2001, and give general anesthesia.  The abdomen was prepped with Betadine and draped sterilely.  A longitudinal incision was made above the umbilicus and through a pursestring suture, the Hasson cannula was placed, and the abdomen was insufflated.  The 30 degree scope was introduced and then a 5 mm was placed in the upper midline.  Two 10-11s were placed on the right side and one in the left upper quadrant.  Through these, we were able to perform the operation.  She was placed in the head up position.  I first incised the gastrohepatic window and dissected free the right crus.  In so doing, I was able to identify the right side of the sac and begin its mobilization up in the chest.  I carried this to the midline.  I then went over and took down the short gastric vessels with the harmonic scalpel, and again dissected the left crus, and then grasped the upper portion of the stomach, and dissected it out of the chest. It was adherent as if it had been incarcerated and there for a while, and all these adhesions were taken down with harmonic scalpel  dissection.  These were somewhat wispy adhesions that were easy to take down with the harmonic, and I stayed right on the stomach in doing this.  Next, I went back around to the right side and did further mobilization on the right, and by doing this, I was able to get around behind the esophagus, and place a Penrose drain.  With Penrose retraction, I was able to free up the esophagus, and get it mobilized nicely so that it was well into the abdomen, and I was able to identify both the right and left crus easily.  The cardia and fundus were mobilized and had a good length of esophagus in the abdomen.  Next, I approximated the hiatus.  I decided since there had been so much inflammation and problems in upper GI tract before, we did have an EG tube in place, but I did not pass a dilator.  I closed the hiatus with four sutures posteriorly, getting nice approximation with good healthy-appearing crura.  I then went anteriorly and approximated the anterior crura, and this seemed even with the pressure down, kind of made this snug, but not too tight.  Next, I went behind the esophagus, and easily under direct vision watched myself as I grasped a portion of the stomach, and pulled it around behind to invaginate the distal esophagus in _______ plicated stomach.  This came around easily and I went over and identified the contiguous portion of the  wrapped stomach using the shoe shine maneuver.  I then secured this wrapped with three sutures again using the Endostitch by placing them all with purchases of esophagus, and tying this down.  The top one was tied extracorporeally, and the two lower ones were tied intracorporeally.  Clips were placed on all three knots for later identification if any radiographic procedure is necessary.  The wrap looked healthy.  I was wrapping above the portion of the sac that it was hanging down on to the stomach, and I kept the wrap below that, and used that as a  handle.  I contemplated packing the stomach to the anterior abdominal wall, and felt I might place a little bit too much attention on the wrap, and also there was a pretty fair distance with the insufflation for doing this.  I elected not to perform a gastropexy. There was no evidence of bleeding and everything looked good and healthy.  I secured the umbilical port with a pursestring under laparoscopic visualization.  I injected all the port sites with 0.5% Marcaine and then we deflated the abdomen and closed the skin with 4-0 Vicryl, Benzoin, and Steri-Strips.  The patient seemed to tolerate the procedure well and she was taken to the recovery room in satisfactory condition. Dictated by:   Thornton Park Daphine Deutscher, M.D. Attending Physician:  Katha Cabal DD:  07/03/01 TD:  07/04/01 Job: [id number]

## 2010-11-09 NOTE — Discharge Summary (Signed)
. [facility]  Patient:    Kristin Solomon, Kristin Solomon Visit Number: [id number] MRN: [id number]          Service Type: MED Location: [id number] Attending Physician:  Lillia Mountain Dictated by:   Thora Lance, M.D. Adm. Date:  02/17/2001 Disc. Date: 02/18/2001   CC:         Florencia Reasons, M.D.  Modesta Messing, M.D.   Discharge Summary  REASON FOR ADMISSION:  This is a 77 year old, white female who presented with three to four black tarry stools the morning of admission.  She has been having episodes of fullness in her chest after eating over the last few weeks. She is on chronic aspirin, but no other nonsteroidal use.  She does have a history of a large hiatal hernia documented in 1993.  PHYSICAL EXAMINATION:  VITAL SIGNS:  Blood pressure supine 162/76 with a heart rate of 124, blood pressure standing 150/92, heart rate 140, temperature 97.9.  ABDOMEN:  Mild epigastric tenderness.  LABORATORY DATA AND X-RAY FINDINGS:  Sodium 141, potassium 4.4, chloride 109, bicarb 24, glucose 99, BUN 25, creatinine 0.7.  Liver function tests normal. PT 12.5, PTT 25.  CBC with WBC 9.2, hemoglobin 12.9, platelet count 312. Chest x-ray showed no acute disease.  EKG with sinus tachycardia.  HOSPITAL COURSE:  The patient was admitted with an upper GI bleed.  She was made NPO and given IV fluids.  Dr. Matthias Hughs of gastroenterology was consulted. The patient underwent an upper endoscopy which showed no active bleeding. There were Sheria Lang lesions seen at the diaphragmatic hiatus with a large 9 cm hiatal hernia which was the presumed source of bleeding.  This was felt to possibly account for the patients chronic iron deficiency anemia.  Dr. Matthias Hughs felt there was a low risk of further significant bleeding.  There was no evidence of gastropathy or peptic ulcer disease.  Dr. Matthias Hughs recommended two to four weeks of PPI therapy and consideration of hiatal  hernia repair. The patients hemoglobin was 10.5 on the morning after admission.  She evidenced no further active bleeding.  Her BUN dropped by discharge.  She was stable at discharge.  DISCHARGE DIAGNOSES: 1. Upper gastrointestinal bleed. 2. Large hiatal hernia.  PROCEDURE:  Upper endoscopy.  DISCHARGE MEDICATIONS: 1. Zocor 40 mg q.d. 2. Protonix 40 mg q.d. 3. Vitamin E 400 IU q.d. 4. Calcium twice a day. 5. Iron sulfate 325 mg once a day. 6. Multivitamin q.d.  SPECIAL INSTRUCTIONS:  The patients Fosamax and aspirin were held at discharge.  Restarting these medications will be discussed at followup.  ACTIVITY:  No restrictions.  DIET:  No restrictions.  FOLLOWUP:  Follow up in two weeks with Dr. Valentina Lucks.  At that time, we will discuss the possibility of a referral to Dr. Luretha Murphy for consideration of hiatal hernia repair surgery. Dictated by:   Thora Lance, M.D. Attending Physician:  Lillia Mountain DD:  02/18/01 TD:  02/18/01 Job: [id number]

## 2010-11-09 NOTE — Consult Note (Signed)
NAME:  Kristin Solomon, Kristin Solomon           ACCOUNT NO.:  [id number]   MEDICAL RECORD NO.:  [id number]          PATIENT TYPE:  INP   LOCATION:  5009                         FACILITY:  [facility]   PHYSICIAN:  James L. Malon Kindle., M.D.DATE OF BIRTH:  1929/12/13   DATE OF CONSULTATION:  05/17/2004  DATE OF DISCHARGE:                                   CONSULTATION   REASON FOR CONSULTATION:  Chest and epigastric pain.   HISTORY:  Ms. Kristin Solomon is a 77 year old woman who was admitted to the  hospital who underwent a left total knee replacement by Dr. Thurston Hole.  The  surgery went uneventfully and she ended up doing well, but had 2 units  transfused.  The surgery was on the 21st.  The patient yesterday began to  have severe epigastric and chest pain.  EKG was okay.  She was seen by Dr.  Johnella Moloney who felt this was more than likely GI.  It was severe burning  in nature located in the epigastrium and chest.  EKG and cardiac work-up was  okay.  The patient noticed that she had been taking a large quantity of  Naprosyn for some time due to knee pain prior to coming in for this joint  replacement.  She has a history of gastroesophageal reflux and had a giant  type 3 hiatal hernia that required surgical repair done laparoscopically by  Dr. Wenda Low in 2003.  For approximately a year and a half to two years  she did fine with no requirement for any antacid medication.  Recently, she  is beginning to have nocturnal reflux symptoms and has been taking  occasional Pepcid AC which has been controlling things.   MEDICATIONS ON ADMISSION:  1.  Lopressor.  2.  Effexor.  3.  Iron.  4.  Zocor.  5.  Vitamin E.  6.  Multivitamins.  7.  Naprosyn.   ALLERGIES:  Denies.   PAST MEDICAL HISTORY:  No chronic medical problems other than elevated  lipids and degenerative joint disease.   PAST SURGICAL HISTORY:  1.  Hiatal hernia repair in 2003 with a laparoscopic nissen fundoplication      by Dr. Daphine Deutscher.  2.   Multiple knee surgeries.  3.  Vaginal hysterectomy.  4.  Several lumpectomies.   FAMILY HISTORY:  Negative for GI cancers.   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   REVIEW OF SYSTEMS:  As noted in present illness, some increasing reflux  symptoms recently.   PHYSICAL EXAMINATION:  VITAL SIGNS:  Patient is afebrile.  Vital signs  normal.  GENERAL:  Alert, pleasant white female in no acute distress.  HEENT:  Eyes:  Sclerae nonicteric.  NECK:  Supple.  No lymphadenopathy.  LUNGS:  Clear.  HEART:  Regular rate and rhythm without murmurs or gallops.  ABDOMEN:  Soft, nondistended with very mild epigastric tenderness.   ASSESSMENT:  Epigastric pain and tenderness.  May well be due to worsening  reflux or more likely due to Naprosyn induced gastric ulcer which certainly  could have been the cause  of her pain.  Of concern is she will require  anticoagulation and this ulcer is high risk of bleeding and we may well need  to hold the Coumadin for a while.   PLAN:  Will go ahead with an endoscopy in the morning and will hold the  Coumadin until after the endoscopy.       JLE/MEDQ  D:  05/17/2004  T:  05/17/2004  Job:  [id number]   cc:   Elana Alm. Thurston Hole, M.D.  [address]  [place] [address]  Fax: [phone]   Candyce Churn, M.D.  [address] [place]  [place] [address]  Fax: [phone]

## 2010-11-09 NOTE — Op Note (Signed)
NAMETRIXIE, MACLAREN NO.:  [id number]   MEDICAL RECORD NO.:  [id number]          PATIENT TYPE:  INP   LOCATION:  2899                         FACILITY:  [facility]   PHYSICIAN:  Kristin Solomon, M.D. DATE OF BIRTH:  1929-09-30   DATE OF PROCEDURE:  05/14/2004  DATE OF DISCHARGE:                                 OPERATIVE REPORT   PREOPERATIVE DIAGNOSIS:  Left knee degenerative joint disease.   POSTOPERATIVE DIAGNOSIS:  Left knee degenerative joint disease.   OPERATION PERFORMED:  Left total knee replacement using Osteonics total knee  system with #7 cemented femur, #5 cemented tibia with 12 mm polyethylene  flexed tibial spacer and 26 mm polyethylene cemented patella.   SURGEON:  Kristin Solomon, M.D.   ASSISTANT:  Julien Girt, P.A.   ANESTHESIA:  General.   OPERATIVE TIME:  One hour and 25 minutes.   COMPLICATIONS:  None.   DESCRIPTION OF PROCEDURE:  Kristin Solomon was brought to the operating room on  May 14, 2004, placed on the operating table in supine position.  She  received Ancef 1 g IV preoperatively for prophylaxis.  After being placed  under general anesthesia, her left knee was examined.  She had a range of  motion from 0 to 125 degrees.  Mild valgus deformity.  Knee stable to  ligamentous exam with normal patellar tracking.  She had a Foley catheter  placed under sterile conditions.  Her left leg was then prepped using  sterile DuraPrep and draped using sterile technique.  The leg was  exsanguinated and the thigh tourniquet elevated to 375 mm.  Initially,  through a 15 cm longitudinal incision based over the patella, initial  exposure was made.  The underlying subcutaneous tissues were incised in line  with the skin incision.  A median arthrotomy was performed revealing an  excessive amount of normal-appearing joint fluid.  The articular surfaces  were inspected.  She had grade 4 changes laterally, grade 3 changes medially  and grade 3  and 4 changes in the patellofemoral joint.  Medial and lateral  meniscal remnants were removed as well as the anterior cruciate ligament.  Osteophytes removed off the femoral condyles and tibial plateau.  Intramedullary drill was then drilled up the femoral canal for placement of  the distal femoral cutting jig which was placed in the appropriate amount of  rotation and a distal 10 mm cut was made.  After this was done, then the  distal femur was sized.  A #7 was found to be the appropriate size.  A  #7  cutting jig was placed and then these cuts were made.  The proximal tibia  was then exposed. Tibial spines were removed with an oscillating saw.  Intramedullary drill was drilled down the tibial canal for placement of the  proximal tibial cutting jig which was placed in the appropriate amount of  rotation and a 6 mm proximal cut was made.  After this was done, then the  Scorpio PCL cutter was placed back on the distal femur and these cuts were  made.  At this point the #7 femoral trial  was placed.  The  #5 tibial  baseplate trial was placed and with a 12 mm polyethylene spacer there was  found to be excellent restoration of normal alignment, excellent stability  with full range of motion.  The tibial base plate was then marked for  rotation and the keel cut was made.  After this was done, then the patella  was sized.  26 mm was found to be the appropriate size.  The recessed 10 mm  by 26 mm Solomon was placed and three locking holes were placed.  After this  was done, it was felt that all the trial components were of excellent size,  fit and stability.  They were then removed and the knee was then jet lavage  irrigated with 3L of saline solution. The proximal tibia was then exposed.  The number 5 tibial base plate with cement backing was hammered in position  with an excellent fit with excess cement being removed from around the  edges.  The #7 femoral component with cement backing was hammered  into  position also with an excellent fit with excess cement being removed from  around the edges.  The 12 mm polyethylene Flex tibial spacer was locked on  the tibial baseplate.  Knee taken through a range of motion 0 to 125 degrees  with excellent stability and no lift off on the tray.  The 26 mm  polyethylene cement backed patella was then locked into its recessed Solomon  and held there with a clamp.  After the cement hardened, patellofemoral  tracking was evaluated.  This was found to be normal.  At this point it was  felt that all of the components were of excellent size, fit and stability.  The knee was further irrigated with saline and then the arthrotomy was  closed with #1 Ethibond suture over two medium Hemovac drains.  Subcutaneous  tissues were closed with 0 and 2-0 Vicryl.  Skin closed with skin staples  were applied.  The Hemovac injected with 0.25% Marcaine with epinephrine and  clamped and then the tourniquet was released, patient awakened and extubated  and taken to recovery room in stable condition.  Sponge and needle counts  were correct times two at the end of this case.       RAW/MEDQ  D:  05/14/2004  T:  05/14/2004  Job:  [id number]

## 2010-11-09 NOTE — Op Note (Signed)
[place]. [facility]  Patient:    Kristin Solomon, Kristin Solomon Visit Number: [id number] MRN: [id number]          Service Type: DSU Location: [facility] Attending Physician:  Twana First Dictated by:   Elana Alm Thurston Hole, M.D. Proc. Date: 05/11/01 Admit Date:  05/11/2001                             Operative Report  PREOPERATIVE DIAGNOSIS:  Right knee lateral meniscus tear with degenerative joint disease.  POSTOPERATIVE DIAGNOSIS:  Right knee lateral meniscus tear with degenerative joint disease.  PROCEDURE: 1. Right knee examination under anesthesia followed by arthroscopic partial    lateral meniscectomy. 2. Right knee chondroplasty.  SURGEON:  Elana Alm. Thurston Hole, M.D.  ASSISTANT:  Julien Girt, P.A.  ANESTHESIA:  Local and MAC.  OPERATIVE TIME:  Thirty minutes.  COMPLICATIONS:  None.  INDICATION FOR PROCEDURE:  [name] is a 77 year old woman who has had four to five months of increasing right knee pain with signs and symptoms consistent with a lateral meniscus tear and DJD, who has failed conservative care and is now to undergo arthroscopy.  DESCRIPTION OF PROCEDURE:  Ms. Ricotta was brought to the operating room on May 11, 2001 after a block had been placed in the holding room.  She was placed on the operative table in supine position.  Her right knee was examined under anesthesia.  Range of motion was 0 to 125 degrees and 1 to 2+ crepitation, knee stable to ligamentous exam, with normal patellar tracking.  The right leg was prepped using sterile Betadine and draped using sterile technique.  Originally, through an inferolateral portal, the arthroscope with a pump attachment was placed, and through an inferomedial portal, an arthroscopic probe was placed.  On initial inspection of the medial compartment, she was found to have 50-60% grade 3 chondromalacia of medial femoral condyle and medial tibial plateau, which was debrided.   Medial meniscus was probed and this was found to be intact.  Intercondylar notch inspected and anterior and posterior cruciate ligaments were normal.  Lateral compartment inspected; she had 40-50% grade 4 and the rest grade 3 chondromalacia in her lateral compartment and this was debrided.  She had degenerative tearing of the posterior and lateral horn, of which 75% was resected back to a stable rim.  Patellofemoral joint showed 50% grade 3 chondromalacia, which was debrided.  The patella tracked normally.  Moderate synovitis in the medial and lateral gutters was debrided, otherwise, they were free of pathology.  After this was done, it was felt that all pathology had been satisfactorily addressed.  The instruments were removed. Portals were closed with 3-0 nylon suture and injected with 0.25% Marcaine with epinephrine and 4 mg of morphine.  Sterile dressing was applied and the patient awakened and taken to the recovery room in stable condition.  FOLLOWUP CARE:  Ms.  Trabert will be followed as an outpatient on Vicodin and Naprosyn.  I will see her back in the office in a week for sutures out and followup. Dictated by:   Elana Alm Thurston Hole, M.D. Attending Physician:  Twana First DD:  05/11/01 TD:  05/11/01 Job: [id number]

## 2010-11-09 NOTE — Op Note (Signed)
[place]. [facility]  Patient:    Kristin Solomon, Kristin Solomon                  MRN: [id number] Proc. Date: 01/01/00 Adm. Date:  [id number] Attending:  Twana First                           Operative Report  PREOPERATIVE DIAGNOSIS:  Right shoulder rotator cuff tear.  POSTOPERATIVE DIAGNOSES: 1. Right shoulder rotator cuff tear. 2. Right shoulder partial biceps tendon tear. 3. Right shoulder partial glenoid labrum tear.  PROCEDURE: 1. Right shoulder EOA followed by arthroscopic partial biceps tendon tear, and    labrum tear, debridement 2. Right shoulder open rotator cuff repair with subacromial decompression.  SURGEON:  Elana Alm. Thurston Hole, M.D.  ASSISTANT:  Kirstin Adelberger, P.A.  ANESTHESIA:  General.  OPERATIVE TIME:  45 minutes.  COMPLICATIONS:  None.  INDICATIONS FOR PROCEDURE:  Kristin Solomon is a 77 year old woman who sustained a rotator cuff tear approximately 2 months ago reaching for something with a pop.  Has had persistent pain with an MRI documenting a complete tear.  Failed conservative care and is now to undergo arthroscopy and rotator cuff repair.  DESCRIPTION OF PROCEDURE:  Kristin Solomon was brought to the operating room on January 01, 2000, after a supraclavicular block had been placed in the holding room.  Her right shoulder was examined under anesthesia.  She had a full range of motion.  Her shoulder was stable in ligamentous exam.  She was placed in a beach chair position and her shoulder and arm was prepped using sterile Betadine and draped using sterile technique.  Originally through a posterior arthroscopic portal the arthroscope with a pump attached was placed into an anterior portal and arthroscopic probe was placed.  On initial inspection the articular cartilage in the glenohumeral joint was found to be normal. Anterior labrum partial tearing 25-30% which was debrided.  Posterior labrum was intact.  Superior labrum, biceps  tendon, and ______ was intact.  The biceps tendon showed a 30-40% partial tear in the intra-articular portion which was debrided but near the exit point it was intact.  The rotator cuff showed complete tear of the supraspinatus but this did appear repairable.  A lateral portal was made, subacromial space was entered.  Further debridement carried out. Subacromial decompression carried out with a 6 mm bur and then through this lateral portal a lateral deltoid splitting incision was made for exposure to the underlying subacromial space which was then entered.  The rotator cuff tear was well visualized.  It could be pulled back easily to a rather significant stump on the greater tuberosity and thus it was repaired primarily with multiple #2 panacryl sutures repaired tendon to tendon back to the stump in the greater tuberosity with firm fixation.  After this was done the shoulder could be brought through a full range of motion with no impingement on the repair.  The wound was irrigated and closed using 0 Vicryl and 2-0 Vicryl closed the deltoid fashion and subcutaneous tissues and 3-0 Prolene in the subcuticular layer.  Steri-Strips were applied.  Sterile dressings were applied in a sling and then the patient awakened and taken to the recovery room in stable condition.  FOLLOWUP CARE:  Kristin Solomon will be followed overnight in the recovery care center for IV pain control and neurovascular monitoring.  Discharge her on Tylox for pain.  Return back to the office  in 1 week for sutures out and followup.  Begin early physical therapy for passive range of motion only x 6 weeks and then active thereafter. DD:  01/01/00 TD:  01/02/00 Job: [id number]

## 2010-11-09 NOTE — Consult Note (Signed)
NAME:  Kristin Solomon, WOLDEN NO.:  [id number]   MEDICAL RECORD NO.:  [id number]          PATIENT TYPE:  INP   LOCATION:  5009                         FACILITY:  [facility]   PHYSICIAN:  Candyce Churn, M.D.DATE OF BIRTH:  05/12/1930   DATE OF CONSULTATION:  DATE OF DISCHARGE:                                   CONSULTATION   FINDINGS:  1.  Severe epigastric and right upper quadrant pain.  2.  History of gastroesophageal reflux disease with fundoplication.  3.  INTOLERANCE TO MILK PRODUCTS OVER THE PAST SEVERAL MONTHS.  4.  History of tachycardia -- well-controlled on beta-blocker therapy.  5.  History of depression -- controlled.  6.  Status post left total knee replacement May 14, 2004.  7.  Postoperative anemia -- improved with transfusion of two units packed      red cells on May 16, 2004.  8.  EKG without evidence of ischemia with chest pain -- performed today.   RECOMMENDATIONS:  1.  Check liver function tests, amylase, lipase, CK, CK-MB.  2.  Check abdominal ultrasound today and biliary scan in a.m.  3.  Protonix 40 mg p.o. b.i.d.  4.  Low fat and lactose-free diet.   DISCUSSION:  1.  Ms. Kristin Solomon is a very pleasant 77 year old female with a      history of GERD with fundoplication in 2003.  2.  Chronic tachycardia well-controlled on beta-blocker therapy.  3.  History of depression -- controlled.  4.  Bilateral knee degenerative joint disease with arthroscopic surgery on      both knees in the past with left total knee replacement performed on      May 14, 2004 by Dr. Salvatore Marvel.   She has been having some indigestion over the last several months and has  been surprised at this because of her Nissen fundoplication and how well  this worked for her in 2003.  She has been taking occasional Pepcid at home  with some relief.   Approximately a year ago she was worked up for biliary disease with an  unrevealing abdominal  ultrasound.   She states over the last several months she has been having some diarrhea  with what she thought was milk containing foods and thought she might have  some lactose intolerance.   She had done relatively well postoperatively from May 14, 2004 from her  left total knee replacement except she did experience some postoperative  anemia and tachycardia and this has resolved with transfusion of two units  of packed red cells on May 16, 2004.   Last night and this morning she has had several episodes of epigastric pain  that were quite severe, but they have been nonradiating and she has not  experienced shortness of breath or diaphoresis.  She has feels like she has  had some relief with some p.o. Protonix.   On exam today she is alert and oriented.  VITAL SIGNS:  Temperature 97.2, pulse 100 and regular, respiratory rate 20  and nonlabored, blood pressure 133/77, room air O2 saturation is 92% on  pulse ox.  HEENT:  Benign.  NECK:  Supple without JVD.  CHEST:  Clear to auscultation.  CARDIAC:  Regular rhythm, no murmurs, rubs or gallops.  ABDOMEN:  Soft, but exquisitely tender in the epigastrium and more so in the  right upper quadrant.  Bowel sounds are normal.  Abdomen is nondistended.  No obvious abdominal masses.  No obvious organomegaly.  EXTREMITIES:  Without cyanosis, clubbing or edema.  She does have a clean  dry bandage over her left knee.   LABORATORY REVIEW:  Her labs on May 17, 2004 reveal a white count 7700,  hemoglobin 10.3, platelet count 209,000, pro time is 18.6 seconds, INR 1.9,  sodium 139, potassium 3.2, chloride 105, bicarb 28, glucose 119, BUN 8,  creatinine 0.5, calcium 8.1.   Her hemoglobin on May 16, 2004 was 8.0 and on admission it was 9.8.  She was taken iron as an outpatient.   EKG this morning revealed normal sinus rhythm with some sinus arrythmia.  There is no ST segment elevation or depression.  Axis and intervals are   within normal limits.   ASSESSMENT:  Abdominal pain -- I feel more likely is gastrointestinal in  nature.  I feel like she may be having some esophageal reflux with spasm.  Could have some gastritis -- takes a baby aspirin daily -- but she is tender  in the right upper quadrant to suggest a possible biliary disease.   We will work up as noted above in recommendations.      Robe   RNG/MEDQ  D:  05/17/2004  T:  05/17/2004  Job:  [id number]

## 2010-11-09 NOTE — Discharge Summary (Signed)
NAME:  Kristin Solomon, Kristin Solomon           ACCOUNT NO.:  [id number]   MEDICAL RECORD NO.:  [id number]          PATIENT TYPE:  INP   LOCATION:  5009                         FACILITY:  [facility]   PHYSICIAN:  Elana Alm. Thurston Hole, M.D. DATE OF BIRTH:  07-18-1929   DATE OF ADMISSION:  05/14/2004  DATE OF DISCHARGE:  05/19/2004                                 DISCHARGE SUMMARY   ADMITTING DIAGNOSES:  1.  End-stage degenerative joint disease, left knee.  2.  Iron deficiency anemia.  3.  Irregular heart rate.  4.  Depression.  5.  High cholesterol.  6.  Cataract.   DISCHARGE DIAGNOSES:  1.  End-stage degenerative joint disease, left knee, status post total knee      replacement.  2.  Iron deficiency anemia.  3.  Postoperative blood loss anemia.  4.  Irregular heart rate.  5.  Depression.  6.  High cholesterol.  7.  Cataract.  8.  Gastroesophageal reflux disease.  9.  A stomach ulcer.   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  She has  tried conservative treatment including debriding arthroscopy, rest, all  without success.  She has pain at night, pain at rest, pain with any motion.  She understands the risks, benefits and possible complications of a total  knee replacement and is without question.   PROCEDURES IN-HOUSE:  On May 14, 2004 she underwent a left total knee  replacement by Dr. Thurston Hole.  She tolerated the procedure well  postoperatively.  She had a femoral nerve block by anesthesia postop day  one.  Temperature was 97.4; blood pressure was 154/69.  Hemoglobin was 9.8.  Her potassium was high at 5.5.  Her calcium was 8.2 and her glucose was 146.  IV fluids were switched to normal saline.  Postoperative day two, patient  had some dizziness with sitting.  Her hemoglobin was 8.0.  Blood pressure  was 123/52.  INR was 1.8.  Patient was transfused two units of packed red  blood  cells.  Postoperative day three, patient complaining of severe  indigestion.  Dr. Kevan Ny was consulted.  She was given Maalox.  Dr. Kevan Ny  started her on Protonix 40 mg b.i.d.  GI was consulted.  They were concerned  about a peptic ulcer and on November 25 they did an endoscopy that did show  a peptic ulcer.  Patient feeling better after the endoscopy, eating dinner,  pain is under control.  Surgical wound was well-approximated.  Postoperative  day five, patient still without complaint.  Improved on Protonix.  Postoperative blood loss anemia resolved after transfusion.  Hemoglobin was  10.4.  She was discharged to home in stable condition, weightbearing as  tolerated, on Protonix 40 mg 1 p.o. b.i.d., Percocet 1-2 q.4-6 h. p.r.n.  pain, Lopressor 25 mg b.i.d., Effexor SR 37.5 at night, iron 150 mg b.i.d.,  Zocor 40 mg q. day, vitamin E 400 international units daily,  multivitamin daily and Coumadin 5 mg 1 p.o. q. day.  She will get home  health  physical therapy, occupational therapy and nursing for Coumadin  monitoring.  She is weightbearing as tolerated on a regular diet.  We will  see her back in the office when she is 2 weeks postoperative.       KS/MEDQ  D:  07/11/2004  T:  07/11/2004  Job:  [id number]

## 2010-11-09 NOTE — H&P (Signed)
. [facility]  Patient:    Kristin Solomon, Kristin Solomon Visit Number: [id number] MRN: [id number]          Service Type: Attending:  Thora Lance, M.D. Dictated by:   Thora Lance, M.D. Adm. Date:  02/12/01   CC:         Modesta Messing, M.D.   History and Physical  CHIEF COMPLAINT:  Black stools.  HISTORY OF PRESENT ILLNESS:  This is a 77 year old white female who presents with three to four black tarry bowel movements as of this morning; her last bowel movement was about three hours ago.  She has had episodes of fullness in her chest after eating over the last few weeks which have been relieved by Zantac.  She denies any abdominal pain, nausea, vomiting, dysphagia or odynophagia or bright red blood in her stool.  She chronically takes one adult-size aspirin per day but no other nonsteroidal use.  No history of alcohol use.  She has no history of peptic ulcer disease or GI bleed.  She does have a history of a hiatal hernia documented on an upper GI in 1993; also history of left diverticulosis documented on colonoscopy, Dr. Genene Churn. Sherin Quarry, in January 2002.  ALLERGIES:  LIPITOR caused elevated liver function tests.  NONSTEROIDALS caused elevated liver tests.  CURRENT MEDICATIONS:  1. Zocor 40 mg q.p.m.  2. Fosamax 70 mg q.wk.  3. Enteric-coated aspirin 325 mg q.d.  4. Vitamin E 400 IU q.d.  5. Calcium sporadically.  6. Iron sulfate 325 mg twice a week.  7. Multivitamin q.d.  PAST MEDICAL HISTORY: Medical:  1. Hyperlipidemia.  2. History of elevated liver tests felt secondary to nonsteroidals and also     Lipitor.  3. History of chest pain with normal nuclear cardiac tests in 1994 and 2000.  4. Degenerative changes in the thumb.  5. Wrist tendinitis.  6. Diverticulosis on colonoscopy in January 2002.  7. Hiatal hernia with reflux appreciated on upper GI in February 1993.  8. Fibrocystic breast disease, status post two lumpectomies  by     Dr. Milus Mallick.  9. Irritable bowel syndrome and probable lactose intolerance. 10. History of bronchitis and post-bronchitic inflammation,     Dr. Joni Fears D. Young. 11. Marked cystourethrocele with urinary incontinence. 12. PVCs. 13. Iron deficiency anemia. 14. Osteoporosis. 15. History of mild tachycardia. 16. History of rotator cuff tendinitis.  Surgical:  1. Arthroscopic repair of left knee lateral meniscus, Dr. Elana Alm. Thurston Hole,     October 1999.  2. Arthroscopic ______,  right knee, October 1994, Dr. Thurston Hole.  3. Right shoulder arthroscopy and then open surgery, Dr. Thurston Hole, July 2001,     for rotator cuff tear.  4. Left shoulder arthroscopic surgery, Dr. Thurston Hole, February 2002.  5. Vaginal hysterectomy without oophorectomy, anterior colporrhaphy and Kelly     cystourethropexy, Dr. Darcella Cheshire, November 1996.  6. T&A, 1932.  7. Right breast lumpectomy once or twice with atypical intraductal     hyperplasia.  FAMILY HISTORY:  Father, age 41, Parkinsons disease.  Mother died at 89 of massive myocardial infarction.  SOCIAL HISTORY:  Married.  She has four children.  Smoked sporadically for 45 years.  Alcohol:  Occasional.  REVIEW OF SYSTEMS:  As above.  PHYSICAL EXAMINATION:  GENERAL:  She appears comfortable in the office and she is in no acute distress.  VITAL SIGNS:  Blood pressure supine 162/76 with a heart rate of 124; blood pressure standing is  160/92 with a heart rate of 140.  Temperature is 97.9.  HEENT:  Pupils are equal, round and respond to light and extraocular movements are intact.  Tympanic membranes are clear.  Oral cavity:  Pharynx is clear.  NECK:  No lymphadenopathy.  LUNGS:  Clear.  HEART:  Tachycardic, regular.  No murmur, gallop or rub.  ABDOMEN:  Soft.  There is mild epigastric tenderness without rebound or guarding.  No mass.  No hepatosplenomegaly.  PELVIC:  Deferred.  RECTAL:  Normal tone.  Heme-positive black  stool.  EXTREMITIES:  No edema.  NEUROLOGIC:  Nonfocal.  LABORATORY DATA:  Laboratories are pending.  ASSESSMENT:  1. Probable upper gastrointestinal bleed, suspect related to aspirin use.  2. Tachycardia.  3. Multiple other medical problems as listed above, all of which are stable.  PLAN:  Admit.  IV fluids.  GI consult.  Protonix IV.  Serial hemoglobins. Type and cross two units of PRBC and transfuse for hemoglobin less than 8.5. No nonsteroidals. Dictated by:   Thora Lance, M.D. Attending:  Thora Lance, M.D. DD:  02/17/01 TD:  02/17/01 Job: [id number]

## 2010-11-09 NOTE — Op Note (Signed)
NAME:  SERINITY, WARE           ACCOUNT NO.:  [id number]   MEDICAL RECORD NO.:  [id number]          PATIENT TYPE:  INP   LOCATION:  5009                         FACILITY:  [facility]   PHYSICIAN:  James L. Malon Kindle., M.D.DATE OF BIRTH:  [birth year]   DATE OF PROCEDURE:  05/18/2004  DATE OF DISCHARGE:                                 OPERATIVE REPORT   PROCEDURE:  Esophagogastroduodenoscopy.   MEDICATIONS:  1.  Cetacaine spray.  2.  Fentanyl 30 mcg IV.  3.  Versed 6 mg IV.   INDICATIONS:  A patient with severe chest and epigastric pain.  Has had a  history of reflux.  Had a total knee replacement and needs anticoagulation.  Has been on Naprosyn.  This is done to evaluate for severe ulceration, that  may prevent the use of anticoagulation.   DESCRIPTION OF PROCEDURE:  The procedure had been explained to the patient  and consent obtained.   With the patient in the left lateral decubitus position, the Olympus scope  was inserted and advanced.  The stomach was entered first, identified and  passed the duodenum including the bulb and second portion; it was seen well  and not remarkable.  The scope was withdrawn back into the stomach; the  pyloric channel, antrum and body were totally normal.  Fundus and cardia  seen well in retroflex view and were normal.   There was, what appeared to be, a somewhat tight GE junction.  This is  consistent with the patient's previous history of Nissan fundoplication.  Just above this was severe ulcerative esophagitis, extending 5 cm in the  distal esophagus.  The esophagus was somewhat friable.  Above 5-6 cm the  esophagus was pink, normal; no ulceration or exudate.  The proximal  esophagus was normal.   The scope was withdrawn.  The patient tolerated the procedure well.   ASSESSMENT:  Severe ulcerative esophagitis; probably due to reflux, in a  woman who has had previous Nissan fundoplication.  No active bleeding and no  nonsteroidal-induced  ulcerations of the stomach.   PLAN:  Will give b.i.d. proton pump inhibitor for one week, and then back to  daily.  Will resume anticoagulation.  She will need a repeat endoscopy with  barium swallow in two months by  Tasia Catchings, M.D.; since she has had a history of long-term reflux, as  soon as possible.  This could represent occult malignancy and needs to be  followed until healed.       JLE/MEDQ  D:  05/18/2004  T:  05/18/2004  Job:  [id number]   cc:   Elana Alm. Thurston Hole, M.D.  [address]  [place] [address]  Fax: [phone]   Candyce Churn, M.D.  [address] [place]  [place] [address]  Fax: [phone]   Tasia Catchings, M.D.  [address]  [place]  [place] [address]  Fax: [phone]   Thornton Park. Daphine Deutscher, MD  [address]  [place]  [place] [address]  Fax: [phone]

## 2010-11-09 NOTE — Discharge Summary (Signed)
[facility]  Patient:    Kristin Solomon, Kristin Solomon Visit Number: [id number] MRN: [id number]          Service Type: SUR Location: 4W [id number] 01 Attending Physician:  Katha Cabal Dictated by:   [place] Daphine Deutscher, M.D. Admit Date:  07/03/2001 Discharge Date: 07/06/2001   CC:         Florencia Reasons, M.D.  Thora Lance, M.D.   Discharge Summary  ADMITTING DIAGNOSIS:  Giant hiatal hernia (type 3).  PROCEDURE:  On July 03, 2001, a laparoscopic repair of giant hiatal with Nissen fundoplication.  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  She underwent a laparoscopic repair of giant hiatal hernia with Nissen fundoplication.  She was begun on liquids and did well.  By January 13, she was tolerating a diet and was ready for discharge to return in three weeks.  DISCHARGE MEDICATION:  Vicodin for pain p.r.n.  DISCHARGE DIAGNOSIS:  Status post repair of giant, type 3, hiatal hernia. Dictated by:   [place] Daphine Deutscher, M.D. Attending Physician:  Katha Cabal DD:  07/21/01 TD:  07/22/01 Job: [id number]

## 2010-11-09 NOTE — Consult Note (Signed)
[place]  Patient:    Kristin Solomon, Kristin Solomon Visit Number: [id number] MRN: [id number]          Service Type: MED Location: [phone] Attending Physician:  Lillia Mountain Dictated by:   Florencia Reasons, M.D. Proc. Date: 02/17/01 Adm. Date:  02/17/2001 Disc. Date: 02/18/2001   CC:         Thora Lance, M.D.  Genene Churn. Sherin Quarry, M.D.   Consultation Report  HISTORY OF PRESENT ILLNESS:  Dr. Valentina Lucks asked me to see this 77 year old female because of gastrointestinal bleeding.  The patient has no prior history of gastrointestinal bleeding.  She has no history of ulcer disease, but she is on a daily aspirin.  She has a chronic history of iron deficiency anemia for which colonoscopy by Dr. Genene Churn. Sherin Quarry earlier this year (January) showed only diverticular disease.  Today, the patient began to notice melanic stools and had four or five of them without any orthostatic symptoms or evidence of hemodynamic instability. However, in view of these findings she presented to Dr. Kandyce Rud office where she was noted to be somewhat tachycardic with a stable blood pressure, but with her pulse going from 125 to 140 from supine to standing.  The patient has had no prodromal upper tract symptomatology.  PAST MEDICAL HISTORY:  Elevation of liver chemistries in association with Lipitor and NSAIDs.  CURRENT MEDICATIONS: 1. Zocor. 2. Fosamax. 3. Enteric coated adult aspirin 4. Ferrous sulfate 2 x weekly. 5. Vitamin E. 6. Multiple vitamins.  PAST SURGICAL HISTORY:  Vaginal hysterectomy approximately eight years ago and several lumpectomies.  MEDICAL ILLNESSES:  Hyperlipidemia, diverticulosis, IBS, and osteoporosis.  No known cardiopulmonary disease, diabetes, or hypertension.  HABITS:  Nonsmoker, occasional ethanol.  FAMILY HISTORY:  Negative for GI tract illnesses such as colon cancer, ulcers, diverticular disease, gallstones.  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  REVIEW OF SYSTEMS:  Remarkably negative for GI tract symptoms, apart from a tendency for two to three bowel movements a day consistent with her history of IBS.  No problem with anorexia, involuntary weight loss, abdominal pain, nausea and vomiting, dyspeptic symptoms, heartburn, dysphagia, or previous GI bleeding.  PHYSICAL EXAMINATION:  VITAL SIGNS:  In the emergency room blood pressure 158/77, pulse 135, temperature 98.0, respirations 20 and nonlabored.  GENERAL:  A very pleasant female in no evident distress whatsoever.  HEENT:  Anicteric.  LUNGS:  Clear to auscultation.  HEART:  No murmur, rub, gallop, clicks, or arrhythmias, apart from a slightly rapid rate.  ABDOMEN:  Obese, no organomegaly, guarding, mass, or tenderness appreciated.  RECTAL:  Exam by Dr. Valentina Lucks showed black heme-positive stool and was not repeated.  LABS:  Hemoglobin 12.9 with MCV 92, RDW 12.6 (normal), white count normal at 9200, and platelets normal at 312,000.  Pro time normal at 12.5 seconds.  BUN slightly elevated at 25 with a creatinine of 0.7.  Remainder of CMET is within normal limits.  X-RAY:  Upper endoscopy (please see separate dictated report.)  The patients upper endoscopy showed a huge hiatal hernia, very patulous diaphragmatic hiatus, some coffee-grounds strains in the gastric lumen, but no frank blood present and several Cameron erosions, one of which had the astigmia of hemorrhage characterized by a dirty base.  There was no evidence of aspirin-related ulcers, erosions, or gastropathy.  IMPRESSION:  Nondestabilizing low-grade upper gastrointestinal bleed characterized by melanic stools and  perhaps slight drop in hemoglobin (baseline hemoglobin not known, but the patient gives history of iron deficiency anemia) all of which can be explained by the observed large hiatal hernia  and associated Sheria Lang lesion.  RECOMMENDATIONS: 1. Would observe overnight to confirm stabilization and hopefully some return    of the heart rate to a more normal level, but thereafter I think the    patient could be discharged unless there is a significant drop in her    hemoglobin or other evidence of clinical instability.  Based on the    endoscopic findings, she would appear to be at low risk of any significant    further bleeding. 2. A case could be made for surgical correction of hiatal hernia in view of    the patients history of chronic iron deficiency anemia which is probably    due to the hernia, and now this episode of gastrointestinal bleeding.    However, since this episode of gastrointestinal was relatively minor and    not particularly destabilizing, certainly a case could be made for    expectant management.  The question is often raised as to the role of    antipeptic therapy in this setting.  Sheria Lang lesions are not thought to be    primarily peptic origin, although antipeptic therapy with a PPI is often    used.  The efficacy of doing so is not known.  Nonetheless, I think a    several week course of PPI therapy in this patient, at a minimum, could be    justified.  Again, consideration for surgical repair of the hiatal hernia    would be appropriate.  I appreciate the opportunity to have seen this patient in consultation with you. Dictated by:   Florencia Reasons, M.D. Attending Physician:  Lillia Mountain DD:  02/17/01 TD:  02/18/01 Job: [id number]

## 2011-02-19 ENCOUNTER — Other Ambulatory Visit: Payer: Self-pay | Admitting: Neurosurgery

## 2011-02-19 DIAGNOSIS — M549 Dorsalgia, unspecified: Secondary | ICD-10-CM

## 2011-02-20 ENCOUNTER — Ambulatory Visit
Admission: RE | Admit: 2011-02-20 | Discharge: 2011-02-20 | Disposition: A | Discharge status: Home / Self Care | Payer: Medicare Other | Source: Ambulatory Visit | Attending: Neurosurgery | Admitting: Neurosurgery

## 2011-02-20 DIAGNOSIS — M549 Dorsalgia, unspecified: Secondary | ICD-10-CM

## 2011-03-14 HISTORY — PX: DESCEMETS STRIPPING AUTOMATED ENDOTHELIAL KERATOPLASTY: SHX1454

## 2011-03-15 LAB — BASIC METABOLIC PANEL
CO2: 26
Calcium: 9.2
Creatinine, Ser: 0.77
GFR calc Af Amer: >60
GFR calc non Af Amer: >60

## 2011-03-15 LAB — POCT HEMOGLOBIN-HEMACUE: Hemoglobin: 13.6

## 2011-03-18 LAB — BASIC METABOLIC PANEL
CO2: 25
Calcium: 9
Creatinine, Ser: 0.81
GFR calc Af Amer: >60
GFR calc non Af Amer: >60

## 2011-03-25 LAB — BASIC METABOLIC PANEL
CO2: 26
Calcium: 9.3
Creatinine, Ser: 0.51
GFR calc Af Amer: >60
Glucose, Bld: 167 — ABNORMAL HIGH

## 2011-06-27 DIAGNOSIS — J45991 Cough variant asthma: Secondary | ICD-10-CM | POA: Diagnosis not present

## 2011-06-27 DIAGNOSIS — J209 Acute bronchitis, unspecified: Secondary | ICD-10-CM | POA: Diagnosis not present

## 2011-07-02 DIAGNOSIS — Z85828 Personal history of other malignant neoplasm of skin: Secondary | ICD-10-CM | POA: Diagnosis not present

## 2011-07-02 DIAGNOSIS — D044 Carcinoma in situ of skin of scalp and neck: Secondary | ICD-10-CM | POA: Diagnosis not present

## 2011-07-02 DIAGNOSIS — L821 Other seborrheic keratosis: Secondary | ICD-10-CM | POA: Diagnosis not present

## 2011-07-02 DIAGNOSIS — D239 Other benign neoplasm of skin, unspecified: Secondary | ICD-10-CM | POA: Diagnosis not present

## 2011-07-03 DIAGNOSIS — Z1231 Encounter for screening mammogram for malignant neoplasm of breast: Secondary | ICD-10-CM | POA: Diagnosis not present

## 2011-07-18 DIAGNOSIS — R079 Chest pain, unspecified: Secondary | ICD-10-CM | POA: Diagnosis not present

## 2011-07-18 DIAGNOSIS — J45991 Cough variant asthma: Secondary | ICD-10-CM | POA: Diagnosis not present

## 2011-07-22 DIAGNOSIS — B0052 Herpesviral keratitis: Secondary | ICD-10-CM | POA: Diagnosis not present

## 2011-07-31 DIAGNOSIS — B0052 Herpesviral keratitis: Secondary | ICD-10-CM | POA: Diagnosis not present

## 2011-08-14 DIAGNOSIS — B0052 Herpesviral keratitis: Secondary | ICD-10-CM | POA: Diagnosis not present

## 2011-09-23 DIAGNOSIS — M21619 Bunion of unspecified foot: Secondary | ICD-10-CM | POA: Diagnosis not present

## 2011-09-23 DIAGNOSIS — M25579 Pain in unspecified ankle and joints of unspecified foot: Secondary | ICD-10-CM | POA: Diagnosis not present

## 2011-09-23 DIAGNOSIS — M2040 Other hammer toe(s) (acquired), unspecified foot: Secondary | ICD-10-CM | POA: Diagnosis not present

## 2011-09-23 DIAGNOSIS — M79609 Pain in unspecified limb: Secondary | ICD-10-CM | POA: Diagnosis not present

## 2011-09-23 DIAGNOSIS — M65979 Unspecified synovitis and tenosynovitis, unspecified ankle and foot: Secondary | ICD-10-CM | POA: Diagnosis not present

## 2011-09-23 DIAGNOSIS — M659 Synovitis and tenosynovitis, unspecified: Secondary | ICD-10-CM | POA: Diagnosis not present

## 2011-10-02 DIAGNOSIS — B0052 Herpesviral keratitis: Secondary | ICD-10-CM | POA: Diagnosis not present

## 2011-10-04 DIAGNOSIS — M216X9 Other acquired deformities of unspecified foot: Secondary | ICD-10-CM | POA: Diagnosis not present

## 2011-10-15 DIAGNOSIS — M25559 Pain in unspecified hip: Secondary | ICD-10-CM | POA: Diagnosis not present

## 2011-10-15 DIAGNOSIS — M1710 Unilateral primary osteoarthritis, unspecified knee: Secondary | ICD-10-CM | POA: Diagnosis not present

## 2011-10-15 DIAGNOSIS — M169 Osteoarthritis of hip, unspecified: Secondary | ICD-10-CM | POA: Diagnosis not present

## 2011-10-15 DIAGNOSIS — D649 Anemia, unspecified: Secondary | ICD-10-CM | POA: Diagnosis not present

## 2011-10-21 DIAGNOSIS — M1710 Unilateral primary osteoarthritis, unspecified knee: Secondary | ICD-10-CM | POA: Diagnosis not present

## 2011-10-25 DIAGNOSIS — M545 Low back pain, unspecified: Secondary | ICD-10-CM | POA: Diagnosis not present

## 2011-11-08 DIAGNOSIS — M6530 Trigger finger, unspecified finger: Secondary | ICD-10-CM | POA: Diagnosis not present

## 2011-11-27 DIAGNOSIS — B0052 Herpesviral keratitis: Secondary | ICD-10-CM | POA: Diagnosis not present

## 2011-12-05 DIAGNOSIS — K219 Gastro-esophageal reflux disease without esophagitis: Secondary | ICD-10-CM | POA: Diagnosis not present

## 2011-12-05 DIAGNOSIS — I10 Essential (primary) hypertension: Secondary | ICD-10-CM | POA: Diagnosis not present

## 2011-12-05 DIAGNOSIS — G5600 Carpal tunnel syndrome, unspecified upper limb: Secondary | ICD-10-CM | POA: Diagnosis not present

## 2011-12-05 DIAGNOSIS — M6530 Trigger finger, unspecified finger: Secondary | ICD-10-CM | POA: Diagnosis not present

## 2011-12-05 DIAGNOSIS — M19049 Primary osteoarthritis, unspecified hand: Secondary | ICD-10-CM | POA: Diagnosis not present

## 2011-12-05 DIAGNOSIS — E559 Vitamin D deficiency, unspecified: Secondary | ICD-10-CM | POA: Diagnosis not present

## 2011-12-05 DIAGNOSIS — M1710 Unilateral primary osteoarthritis, unspecified knee: Secondary | ICD-10-CM | POA: Diagnosis not present

## 2011-12-09 ENCOUNTER — Encounter (HOSPITAL_COMMUNITY): Payer: Self-pay | Admitting: Pharmacy Technician

## 2011-12-10 ENCOUNTER — Encounter: Payer: Self-pay | Admitting: Physician Assistant

## 2011-12-10 ENCOUNTER — Other Ambulatory Visit: Payer: Self-pay | Admitting: Physician Assistant

## 2011-12-10 DIAGNOSIS — R000 Tachycardia, unspecified: Secondary | ICD-10-CM | POA: Insufficient documentation

## 2011-12-10 DIAGNOSIS — I10 Essential (primary) hypertension: Secondary | ICD-10-CM

## 2011-12-10 DIAGNOSIS — M169 Osteoarthritis of hip, unspecified: Secondary | ICD-10-CM | POA: Diagnosis not present

## 2011-12-10 DIAGNOSIS — D649 Anemia, unspecified: Secondary | ICD-10-CM | POA: Diagnosis not present

## 2011-12-10 DIAGNOSIS — Z96659 Presence of unspecified artificial knee joint: Secondary | ICD-10-CM | POA: Insufficient documentation

## 2011-12-10 DIAGNOSIS — B0230 Zoster ocular disease, unspecified: Secondary | ICD-10-CM

## 2011-12-10 DIAGNOSIS — D509 Iron deficiency anemia, unspecified: Secondary | ICD-10-CM | POA: Insufficient documentation

## 2011-12-10 DIAGNOSIS — M1711 Unilateral primary osteoarthritis, right knee: Secondary | ICD-10-CM

## 2011-12-10 DIAGNOSIS — E785 Hyperlipidemia, unspecified: Secondary | ICD-10-CM | POA: Insufficient documentation

## 2011-12-10 DIAGNOSIS — M1710 Unilateral primary osteoarthritis, unspecified knee: Secondary | ICD-10-CM | POA: Diagnosis not present

## 2011-12-10 NOTE — H&P (Signed)
Kristin Solomon is an 78 y.o. female.   Chief Complaint: right knee djd HPI: Kristin Solomon is seen for follow-up having increased right knee pain with known degenerative joint disease. This is getting progressively worse. She had lumbar spine surgery on 08/29/10 by Dr. Nudelman but does still have left thigh pain and left leg pain.  She has known chronic rotator cuff tear of her shoulders with rotator cuff arthropathy. She had a left total knee replacement 7 years ago and this is doing well. She has history of iron deficiency anemia.  The excruciating pain is getting progressively worse, limiting activities of daily living, poses a significant fall risk, and has failed multiple conservative treatments including intraarticular cortisone injections, intraarticular Supartz, bracing, medication and home physical therapy.  Past Medical History  Diagnosis Date  . Hyperlipidemia   . Hypertension   . Tachycardia   . Depression   . Herpes ocular   . Anemia, iron deficiency     Past Surgical History  Procedure Date  . Back surgery   . Rotator cuff repair w/ distal clavicle excision   . Joint replacement 2005    left total knee  . Abdominal hysterectomy 1995    Family History  Problem Relation Age of Onset  . Heart attack Mother   . Parkinsonism Father   . Diabetes Brother    Social History:  reports that she has quit smoking. She does not have any smokeless tobacco history on file. She reports that she does not drink alcohol. Her drug history not on file.  Allergies: No Known Allergies Current Outpatient Prescriptions on File Prior to Visit  Medication Sig Dispense Refill  . aspirin EC 81 MG tablet Take 81 mg by mouth daily.      . brimonidine-timolol (COMBIGAN) 0.2-0.5 % ophthalmic solution Place 1 drop into the left eye 2 (two) times daily.      . cholecalciferol (VITAMIN D) 1000 UNITS tablet Take 2,000 Units by mouth daily.      . Docusate Calcium (STOOL SOFTENER PO) Take 1 tablet by  mouth daily.      . Ferrous Gluconate (IRON) 240 (27 FE) MG TABS Take 1 tablet by mouth daily.      . metoprolol (LOPRESSOR) 50 MG tablet Take 25-50 mg by mouth 2 (two) times daily. Take 50 MG in the morning and take 25 MG at bedtime.      . omeprazole (PRILOSEC OTC) 20 MG tablet Take 20 mg by mouth 2 (two) times daily.      . prednisoLONE acetate (PRED FORTE) 1 % ophthalmic suspension Place 1 drop into the left eye daily.      . senna (SENOKOT) 8.6 MG TABS Take 1 tablet by mouth.      . valACYclovir (VALTREX) 500 MG tablet Take 1,000 mg by mouth daily.      . venlafaxine XR (EFFEXOR-XR) 75 MG 24 hr capsule Take 75 mg by mouth at bedtime.        (Not in a hospital admission)  No results found for this or any previous visit (from the past 48 hour(s)). No results found.  Review of Systems  Constitutional: Negative.   HENT: Negative.   Eyes: Negative.   Respiratory: Negative.   Cardiovascular: Negative.   Gastrointestinal: Negative.   Genitourinary: Negative.   Musculoskeletal: Positive for joint pain.       Right knee  Skin: Negative.   Neurological: Negative.   Endo/Heme/Allergies: Negative.   Psychiatric/Behavioral: Negative.       Blood pressure 122/72, pulse 82, temperature 98.3 F (36.8 C), height 5' 1" (1.549 m), weight 63.504 kg (140 lb), SpO2 92.00%. Physical Exam  Constitutional: She is oriented to person, place, and time. She appears well-developed and well-nourished.  HENT:  Head: Normocephalic and atraumatic.  Mouth/Throat: Oropharynx is clear and moist.  Eyes: Conjunctivae are normal. Pupils are equal, round, and reactive to light.  Neck: Normal range of motion. Neck supple.  Cardiovascular: Normal rate and regular rhythm.   Respiratory: Effort normal and breath sounds normal.  GI: Soft. Bowel sounds are normal.  Genitourinary:       Not pertinent to current symptomatology therefore not examined.  Musculoskeletal:       Examination of her right knee reveals  moderate valgus deformity 1 to 2+ crepitation 1+ synovitis range of motion from -5 to 120 degrees knee is stable with normal patella tracking. Exam of her left knee reveals well healed total knee incision without swelling or pain, range of motion 0-115 degrees knee is stable with normal patella tracking. Exam of her left hip reveals left SI joint pain no groin pain no lateral bursa pain, full range of motion of the hip with pain in the posterolateral hip and SI joint. Exam of the right hip reveals full range of motion without pain. Vascular exam: pulses 2+ and symmetric.  Neurological: She is alert and oriented to person, place, and time.  Skin: Skin is warm and dry.  Psychiatric: She has a normal mood and affect. Her behavior is normal. Thought content normal.     Assessment Patient Active Problem List  Diagnosis  . Hyperlipidemia  . Hypertension  . Tachycardia  . Herpes ocular  . Anemia, iron deficiency  . Right knee DJD  . Status post total knee replacement    Plan I talk to her in detail about this. I think at this point she is a candidate for right total knee replacement. Discussed risks benefits and possible complications of the surgery in detail and she understands this completely. She is cleared preoperatively by Dr. Gates. She is going to [facility] operatively for skilled care  Kristin Solomon J 12/10/2011, 4:34 PM    

## 2011-12-17 ENCOUNTER — Encounter (HOSPITAL_COMMUNITY): Payer: Self-pay

## 2011-12-17 ENCOUNTER — Encounter (HOSPITAL_COMMUNITY)
Admission: RE | Admit: 2011-12-17 | Discharge: 2011-12-17 | Disposition: A | Discharge status: Home / Self Care | Payer: Medicare Other | Source: Ambulatory Visit | Attending: Orthopedic Surgery | Admitting: Orthopedic Surgery

## 2011-12-17 ENCOUNTER — Encounter (HOSPITAL_COMMUNITY)
Admission: RE | Admit: 2011-12-17 | Discharge: 2011-12-17 | Disposition: A | Discharge status: Home / Self Care | Payer: Medicare Other | Source: Ambulatory Visit | Attending: Physician Assistant | Admitting: Physician Assistant

## 2011-12-17 DIAGNOSIS — J9819 Other pulmonary collapse: Secondary | ICD-10-CM | POA: Diagnosis not present

## 2011-12-17 DIAGNOSIS — M1710 Unilateral primary osteoarthritis, unspecified knee: Secondary | ICD-10-CM | POA: Diagnosis not present

## 2011-12-17 DIAGNOSIS — D62 Acute posthemorrhagic anemia: Secondary | ICD-10-CM | POA: Diagnosis not present

## 2011-12-17 DIAGNOSIS — I10 Essential (primary) hypertension: Secondary | ICD-10-CM | POA: Diagnosis not present

## 2011-12-17 DIAGNOSIS — Z01818 Encounter for other preprocedural examination: Secondary | ICD-10-CM | POA: Diagnosis not present

## 2011-12-17 DIAGNOSIS — E785 Hyperlipidemia, unspecified: Secondary | ICD-10-CM | POA: Diagnosis not present

## 2011-12-17 HISTORY — DX: Gastro-esophageal reflux disease without esophagitis: K21.9

## 2011-12-17 LAB — PROTIME-INR
INR: 0.92 (ref 0.00–1.49)
Prothrombin Time: 12.6 seconds (ref 11.6–15.2)

## 2011-12-17 LAB — DIFFERENTIAL
Basophils Absolute: 0 10*3/uL (ref 0.0–0.1)
Basophils Relative: 1 % (ref 0–1)
Lymphocytes Relative: 45 % (ref 12–46)
Neutro Abs: 2.4 10*3/uL (ref 1.7–7.7)
Neutrophils Relative %: 37 % — ABNORMAL LOW (ref 43–77)

## 2011-12-17 LAB — CBC
HCT: 38.4 % (ref 36.0–46.0)
MCH: 33.8 pg (ref 26.0–34.0)
MCV: 98.2 fL (ref 78.0–100.0)
Platelets: 262 10*3/uL (ref 150–400)
RBC: 3.91 MIL/uL (ref 3.87–5.11)

## 2011-12-17 LAB — COMPREHENSIVE METABOLIC PANEL
AST: 20 U/L (ref 0–37)
BUN: 14 mg/dL (ref 6–23)
CO2: 29 mEq/L (ref 19–32)
Calcium: 9.6 mg/dL (ref 8.4–10.5)
Chloride: 100 mEq/L (ref 96–112)
Creatinine, Ser: 0.61 mg/dL (ref 0.50–1.10)
GFR calc Af Amer: >90 mL/min (ref >90)
GFR calc non Af Amer: 84 mL/min — ABNORMAL LOW (ref >90)
Glucose, Bld: 85 mg/dL (ref 70–99)
Total Bilirubin: 0.3 mg/dL (ref 0.3–1.2)

## 2011-12-17 LAB — URINALYSIS, ROUTINE W REFLEX MICROSCOPIC
Glucose, UA: NEGATIVE mg/dL
Hgb urine dipstick: NEGATIVE
Ketones, ur: NEGATIVE mg/dL
Leukocytes, UA: NEGATIVE
Protein, ur: NEGATIVE mg/dL
pH: 7.5 (ref 5.0–8.0)

## 2011-12-17 LAB — SURGICAL PCR SCREEN: MRSA, PCR: NEGATIVE

## 2011-12-17 LAB — ABO/RH: ABO/RH(D): A POS

## 2011-12-17 NOTE — Progress Notes (Signed)
Primary Physician - Dr. Kevan Ny Does not have cardiologist. Had EKG in 2012; no echo or stress test.

## 2011-12-17 NOTE — Pre-Procedure Instructions (Signed)
22 Kristin Solomon  12/17/2011   Your procedure is scheduled on:  July 1st  Report to [facility] at 0645 AM.  Call this number if you have problems the morning of surgery: [phone]   Remember:   Do not eat food or drink:After Midnight.  Take these medicines the morning of surgery with A SIP OF WATER: Lopressor, prilosec, eye drops, valtrex   Do not wear jewelry, make-up or nail polish.  Do not wear lotions, powders, or perfumes.   Do not shave 48 hours prior to surgery. Men may shave face and neck.  Do not bring valuables to the hospital.  Contacts, dentures or bridgework may not be worn into surgery.  Leave suitcase in the car. After surgery it may be brought to your room.  For patients admitted to the hospital, checkout time is 11:00 AM the day of discharge.   Patients discharged the day of surgery will not be allowed to drive home.  Special Instructions: CHG Shower Use Special Wash: 1/2 bottle night before surgery and 1/2 bottle morning of surgery.   Please read over the following fact sheets that you were given: Pain Booklet, Coughing and Deep Breathing, Blood Transfusion Information, Total Joint Packet, MRSA Information and Surgical Site Infection Prevention

## 2011-12-18 ENCOUNTER — Inpatient Hospital Stay: Admit: 2011-12-18 | Payer: Self-pay | Admitting: Orthopedic Surgery

## 2011-12-18 SURGERY — ARTHROPLASTY, KNEE, TOTAL
Anesthesia: Regional | Laterality: Right

## 2011-12-22 MED ORDER — CEFAZOLIN SODIUM 1-5 GM-% IV SOLN
1.0000 g | INTRAVENOUS | Status: AC
  Administered 2011-12-23: 1 g via INTRAVENOUS
  Filled 2011-12-22: qty 50

## 2011-12-23 ENCOUNTER — Encounter (HOSPITAL_COMMUNITY): Payer: Self-pay | Admitting: Certified Registered"

## 2011-12-23 ENCOUNTER — Inpatient Hospital Stay (HOSPITAL_COMMUNITY)
Admission: RE | Admit: 2011-12-23 | Discharge: 2011-12-26 | DRG: 470 | Disposition: A | Discharge status: Skilled Nursing Facility | Payer: Medicare Other | Source: Ambulatory Visit | Attending: Orthopedic Surgery | Admitting: Orthopedic Surgery

## 2011-12-23 ENCOUNTER — Encounter (HOSPITAL_COMMUNITY): Payer: Self-pay | Admitting: *Deleted

## 2011-12-23 ENCOUNTER — Encounter (HOSPITAL_COMMUNITY)
Admission: RE | Disposition: A | Discharge status: Skilled Nursing Facility | Payer: Self-pay | Source: Ambulatory Visit | Attending: Orthopedic Surgery

## 2011-12-23 ENCOUNTER — Ambulatory Visit (HOSPITAL_COMMUNITY): Payer: Medicare Other | Admitting: Certified Registered"

## 2011-12-23 DIAGNOSIS — F329 Major depressive disorder, single episode, unspecified: Secondary | ICD-10-CM | POA: Diagnosis present

## 2011-12-23 DIAGNOSIS — R1312 Dysphagia, oropharyngeal phase: Secondary | ICD-10-CM | POA: Diagnosis not present

## 2011-12-23 DIAGNOSIS — Z96659 Presence of unspecified artificial knee joint: Secondary | ICD-10-CM | POA: Diagnosis not present

## 2011-12-23 DIAGNOSIS — D62 Acute posthemorrhagic anemia: Secondary | ICD-10-CM | POA: Diagnosis not present

## 2011-12-23 DIAGNOSIS — M1711 Unilateral primary osteoarthritis, right knee: Secondary | ICD-10-CM

## 2011-12-23 DIAGNOSIS — Z471 Aftercare following joint replacement surgery: Secondary | ICD-10-CM | POA: Diagnosis not present

## 2011-12-23 DIAGNOSIS — M064 Inflammatory polyarthropathy: Secondary | ICD-10-CM | POA: Diagnosis not present

## 2011-12-23 DIAGNOSIS — Z79899 Other long term (current) drug therapy: Secondary | ICD-10-CM | POA: Diagnosis not present

## 2011-12-23 DIAGNOSIS — M1710 Unilateral primary osteoarthritis, unspecified knee: Principal | ICD-10-CM | POA: Diagnosis present

## 2011-12-23 DIAGNOSIS — R279 Unspecified lack of coordination: Secondary | ICD-10-CM | POA: Diagnosis not present

## 2011-12-23 DIAGNOSIS — IMO0002 Reserved for concepts with insufficient information to code with codable children: Secondary | ICD-10-CM | POA: Diagnosis not present

## 2011-12-23 DIAGNOSIS — Z87891 Personal history of nicotine dependence: Secondary | ICD-10-CM | POA: Diagnosis not present

## 2011-12-23 DIAGNOSIS — R000 Tachycardia, unspecified: Secondary | ICD-10-CM | POA: Diagnosis not present

## 2011-12-23 DIAGNOSIS — D509 Iron deficiency anemia, unspecified: Secondary | ICD-10-CM | POA: Diagnosis present

## 2011-12-23 DIAGNOSIS — E785 Hyperlipidemia, unspecified: Secondary | ICD-10-CM | POA: Diagnosis not present

## 2011-12-23 DIAGNOSIS — M25569 Pain in unspecified knee: Secondary | ICD-10-CM | POA: Diagnosis not present

## 2011-12-23 DIAGNOSIS — G8918 Other acute postprocedural pain: Secondary | ICD-10-CM | POA: Diagnosis not present

## 2011-12-23 DIAGNOSIS — K219 Gastro-esophageal reflux disease without esophagitis: Secondary | ICD-10-CM | POA: Diagnosis present

## 2011-12-23 DIAGNOSIS — I10 Essential (primary) hypertension: Secondary | ICD-10-CM | POA: Diagnosis not present

## 2011-12-23 DIAGNOSIS — G548 Other nerve root and plexus disorders: Secondary | ICD-10-CM | POA: Diagnosis not present

## 2011-12-23 DIAGNOSIS — M5416 Radiculopathy, lumbar region: Secondary | ICD-10-CM | POA: Diagnosis present

## 2011-12-23 DIAGNOSIS — Z7901 Long term (current) use of anticoagulants: Secondary | ICD-10-CM | POA: Diagnosis not present

## 2011-12-23 DIAGNOSIS — M6281 Muscle weakness (generalized): Secondary | ICD-10-CM | POA: Diagnosis not present

## 2011-12-23 DIAGNOSIS — S8990XA Unspecified injury of unspecified lower leg, initial encounter: Secondary | ICD-10-CM | POA: Diagnosis not present

## 2011-12-23 DIAGNOSIS — F3289 Other specified depressive episodes: Secondary | ICD-10-CM | POA: Diagnosis present

## 2011-12-23 DIAGNOSIS — R269 Unspecified abnormalities of gait and mobility: Secondary | ICD-10-CM | POA: Diagnosis not present

## 2011-12-23 HISTORY — PX: TOTAL KNEE ARTHROPLASTY: SHX125

## 2011-12-23 SURGERY — ARTHROPLASTY, KNEE, TOTAL
Anesthesia: General | Site: Knee | Laterality: Right | Wound class: Clean

## 2011-12-23 MED ORDER — POTASSIUM CHLORIDE IN NACL 20-0.9 MEQ/L-% IV SOLN
INTRAVENOUS | Status: DC
  Administered 2011-12-23: 18:00:00 via INTRAVENOUS
  Filled 2011-12-23 (×9): qty 1000

## 2011-12-23 MED ORDER — FENTANYL CITRATE 0.05 MG/ML IJ SOLN
INTRAMUSCULAR | Status: DC | PRN
  Administered 2011-12-23: 50 ug via INTRAVENOUS
  Administered 2011-12-23: 75 ug via INTRAVENOUS
  Administered 2011-12-23: 25 ug via INTRAVENOUS

## 2011-12-23 MED ORDER — CHLORHEXIDINE GLUCONATE 4 % EX LIQD: 60.0000 mL | Freq: Once | CUTANEOUS | Status: DC

## 2011-12-23 MED ORDER — LACTATED RINGERS IV SOLN: INTRAVENOUS | Status: DC

## 2011-12-23 MED ORDER — ONDANSETRON HCL 4 MG PO TABS: 4.0000 mg | ORAL_TABLET | Freq: Four times a day (QID) | ORAL | Status: DC | PRN

## 2011-12-23 MED ORDER — DEXAMETHASONE 6 MG PO TABS
10.0000 mg | ORAL_TABLET | Freq: Every day | ORAL | Status: AC
  Administered 2011-12-24 – 2011-12-26 (×3): 10 mg via ORAL
  Filled 2011-12-23 (×3): qty 1

## 2011-12-23 MED ORDER — HYDROMORPHONE HCL PF 1 MG/ML IJ SOLN
INTRAMUSCULAR | Status: AC
  Filled 2011-12-23: qty 1

## 2011-12-23 MED ORDER — VENLAFAXINE HCL ER 75 MG PO CP24
75.0000 mg | ORAL_CAPSULE | Freq: Every day | ORAL | Status: DC
  Administered 2011-12-24 – 2011-12-25 (×2): 75 mg via ORAL
  Filled 2011-12-23 (×4): qty 1

## 2011-12-23 MED ORDER — EPHEDRINE SULFATE 50 MG/ML IJ SOLN
INTRAMUSCULAR | Status: DC | PRN
  Administered 2011-12-23: 10 mg via INTRAVENOUS

## 2011-12-23 MED ORDER — METOCLOPRAMIDE HCL 5 MG/ML IJ SOLN: 5.0000 mg | Freq: Three times a day (TID) | INTRAMUSCULAR | Status: DC | PRN

## 2011-12-23 MED ORDER — PHENOL 1.4 % MT LIQD: 1.0000 | OROMUCOSAL | Status: DC | PRN

## 2011-12-23 MED ORDER — CEFAZOLIN SODIUM 1-5 GM-% IV SOLN
1.0000 g | Freq: Four times a day (QID) | INTRAVENOUS | Status: AC
  Administered 2011-12-23 (×2): 1 g via INTRAVENOUS
  Filled 2011-12-23 (×2): qty 50

## 2011-12-23 MED ORDER — PANTOPRAZOLE SODIUM 40 MG PO TBEC
40.0000 mg | DELAYED_RELEASE_TABLET | Freq: Two times a day (BID) | ORAL | Status: DC
  Administered 2011-12-23 – 2011-12-26 (×6): 40 mg via ORAL
  Filled 2011-12-23 (×6): qty 1

## 2011-12-23 MED ORDER — BISACODYL 5 MG PO TBEC
10.0000 mg | DELAYED_RELEASE_TABLET | Freq: Every day | ORAL | Status: DC
  Administered 2011-12-23: 10 mg via ORAL
  Administered 2011-12-25: 5 mg via ORAL
  Filled 2011-12-23: qty 1
  Filled 2011-12-23: qty 2

## 2011-12-23 MED ORDER — SODIUM CHLORIDE 0.9 % IR SOLN
Status: DC | PRN
  Administered 2011-12-23: 3000 mL

## 2011-12-23 MED ORDER — METOPROLOL TARTRATE 50 MG PO TABS
50.0000 mg | ORAL_TABLET | Freq: Every day | ORAL | Status: DC
  Administered 2011-12-24 – 2011-12-26 (×3): 50 mg via ORAL
  Filled 2011-12-23 (×3): qty 1

## 2011-12-23 MED ORDER — DEXAMETHASONE SODIUM PHOSPHATE 10 MG/ML IJ SOLN
10.0000 mg | Freq: Every day | INTRAMUSCULAR | Status: AC
  Filled 2011-12-23 (×3): qty 1

## 2011-12-23 MED ORDER — MENTHOL 3 MG MT LOZG: 1.0000 | LOZENGE | OROMUCOSAL | Status: DC | PRN

## 2011-12-23 MED ORDER — LIDOCAINE HCL 4 % MT SOLN
OROMUCOSAL | Status: DC | PRN
  Administered 2011-12-23: 4 mL via TOPICAL

## 2011-12-23 MED ORDER — ATORVASTATIN CALCIUM 20 MG PO TABS
20.0000 mg | ORAL_TABLET | Freq: Every day | ORAL | Status: DC
  Administered 2011-12-23 – 2011-12-25 (×3): 20 mg via ORAL
  Filled 2011-12-23 (×4): qty 1

## 2011-12-23 MED ORDER — LACTATED RINGERS IV SOLN
INTRAVENOUS | Status: DC | PRN
  Administered 2011-12-23 (×2): via INTRAVENOUS

## 2011-12-23 MED ORDER — BRIMONIDINE TARTRATE-TIMOLOL 0.2-0.5 % OP SOLN: 1.0000 [drp] | Freq: Two times a day (BID) | OPHTHALMIC | Status: DC

## 2011-12-23 MED ORDER — HYDROMORPHONE HCL PF 1 MG/ML IJ SOLN
0.5000 mg | INTRAMUSCULAR | Status: DC | PRN
  Administered 2011-12-23 (×2): 0.5 mg via INTRAVENOUS
  Administered 2011-12-24 – 2011-12-25 (×3): 1 mg via INTRAVENOUS
  Filled 2011-12-23 (×5): qty 1

## 2011-12-23 MED ORDER — FERROUS GLUCONATE 324 (38 FE) MG PO TABS
324.0000 mg | ORAL_TABLET | Freq: Every day | ORAL | Status: DC
  Administered 2011-12-24 – 2011-12-26 (×3): 324 mg via ORAL
  Filled 2011-12-23 (×4): qty 1

## 2011-12-23 MED ORDER — VITAMIN D3 25 MCG (1000 UNIT) PO TABS
2000.0000 [IU] | ORAL_TABLET | Freq: Every day | ORAL | Status: DC
  Administered 2011-12-23 – 2011-12-26 (×4): 2000 [IU] via ORAL
  Filled 2011-12-23 (×5): qty 2

## 2011-12-23 MED ORDER — ACETAMINOPHEN 650 MG RE SUPP: 650.0000 mg | Freq: Four times a day (QID) | RECTAL | Status: DC | PRN

## 2011-12-23 MED ORDER — ENOXAPARIN SODIUM 30 MG/0.3ML ~~LOC~~ SOLN
30.0000 mg | Freq: Two times a day (BID) | SUBCUTANEOUS | Status: DC
  Administered 2011-12-24 – 2011-12-26 (×5): 30 mg via SUBCUTANEOUS
  Filled 2011-12-23 (×7): qty 0.3

## 2011-12-23 MED ORDER — DEXTROSE 5 % IV SOLN
INTRAVENOUS | Status: DC | PRN
  Administered 2011-12-23: 10:00:00 via INTRAVENOUS

## 2011-12-23 MED ORDER — PROPOFOL 10 MG/ML IV EMUL
INTRAVENOUS | Status: DC | PRN
  Administered 2011-12-23: 100 mg via INTRAVENOUS

## 2011-12-23 MED ORDER — ONDANSETRON HCL 4 MG/2ML IJ SOLN: 4.0000 mg | Freq: Once | INTRAMUSCULAR | Status: DC | PRN

## 2011-12-23 MED ORDER — ONDANSETRON HCL 4 MG/2ML IJ SOLN
INTRAMUSCULAR | Status: DC | PRN
  Administered 2011-12-23: 4 mg via INTRAVENOUS

## 2011-12-23 MED ORDER — METOPROLOL TARTRATE 25 MG PO TABS
25.0000 mg | ORAL_TABLET | Freq: Every day | ORAL | Status: DC
  Administered 2011-12-23 – 2011-12-25 (×3): 25 mg via ORAL
  Filled 2011-12-23 (×4): qty 1

## 2011-12-23 MED ORDER — BUPIVACAINE-EPINEPHRINE PF 0.5-1:200000 % IJ SOLN
INTRAMUSCULAR | Status: DC | PRN
  Administered 2011-12-23: 25 mL

## 2011-12-23 MED ORDER — LIDOCAINE HCL (CARDIAC) 20 MG/ML IV SOLN
INTRAVENOUS | Status: DC | PRN
  Administered 2011-12-23: 70 mg via INTRAVENOUS

## 2011-12-23 MED ORDER — ALBUTEROL SULFATE (2.5 MG/3ML) 0.083% IN NEBU: INHALATION_SOLUTION | RESPIRATORY_TRACT | Status: DC | PRN

## 2011-12-23 MED ORDER — BUPIVACAINE-EPINEPHRINE 0.25% -1:200000 IJ SOLN
INTRAMUSCULAR | Status: DC | PRN
  Administered 2011-12-23: 30 mL

## 2011-12-23 MED ORDER — VALACYCLOVIR HCL 500 MG PO TABS
1000.0000 mg | ORAL_TABLET | Freq: Every day | ORAL | Status: DC
  Administered 2011-12-23 – 2011-12-26 (×4): 1000 mg via ORAL
  Filled 2011-12-23 (×5): qty 2

## 2011-12-23 MED ORDER — PREDNISOLONE ACETATE 1 % OP SUSP
1.0000 [drp] | Freq: Every day | OPHTHALMIC | Status: DC
  Administered 2011-12-23 – 2011-12-26 (×4): 1 [drp] via OPHTHALMIC
  Filled 2011-12-23 (×2): qty 1

## 2011-12-23 MED ORDER — CEFUROXIME SODIUM 1.5 G IJ SOLR
INTRAMUSCULAR | Status: DC | PRN
  Administered 2011-12-23: 1.5 g

## 2011-12-23 MED ORDER — CELECOXIB 200 MG PO CAPS
200.0000 mg | ORAL_CAPSULE | Freq: Two times a day (BID) | ORAL | Status: DC
  Administered 2011-12-23 – 2011-12-26 (×5): 200 mg via ORAL
  Filled 2011-12-23 (×7): qty 1

## 2011-12-23 MED ORDER — HYDROMORPHONE HCL PF 1 MG/ML IJ SOLN
0.2500 mg | INTRAMUSCULAR | Status: DC | PRN
  Administered 2011-12-23 (×3): 0.5 mg via INTRAVENOUS

## 2011-12-23 MED ORDER — METOCLOPRAMIDE HCL 10 MG PO TABS: 5.0000 mg | ORAL_TABLET | Freq: Three times a day (TID) | ORAL | Status: DC | PRN

## 2011-12-23 MED ORDER — OMEPRAZOLE MAGNESIUM 20 MG PO TBEC: 20.0000 mg | DELAYED_RELEASE_TABLET | Freq: Two times a day (BID) | ORAL | Status: DC

## 2011-12-23 MED ORDER — DEXAMETHASONE SODIUM PHOSPHATE 4 MG/ML IJ SOLN
INTRAMUSCULAR | Status: DC | PRN
  Administered 2011-12-23: 2 mg via INTRAVENOUS
  Administered 2011-12-23: 8 mg via INTRAVENOUS

## 2011-12-23 MED ORDER — CEFUROXIME SODIUM 1.5 G IJ SOLR
INTRAMUSCULAR | Status: AC
  Filled 2011-12-23: qty 1.5

## 2011-12-23 MED ORDER — DOCUSATE SODIUM 100 MG PO CAPS
100.0000 mg | ORAL_CAPSULE | Freq: Two times a day (BID) | ORAL | Status: DC
  Administered 2011-12-23 – 2011-12-26 (×5): 100 mg via ORAL
  Filled 2011-12-23 (×7): qty 1

## 2011-12-23 MED ORDER — MIDAZOLAM HCL 5 MG/5ML IJ SOLN
INTRAMUSCULAR | Status: DC | PRN
  Administered 2011-12-23: 1 mg via INTRAVENOUS

## 2011-12-23 MED ORDER — POVIDONE-IODINE 7.5 % EX SOLN: Freq: Once | CUTANEOUS | Status: DC

## 2011-12-23 MED ORDER — BRIMONIDINE TARTRATE 0.2 % OP SOLN
1.0000 [drp] | Freq: Two times a day (BID) | OPHTHALMIC | Status: DC
  Administered 2011-12-23 – 2011-12-26 (×5): 1 [drp] via OPHTHALMIC
  Filled 2011-12-23: qty 5

## 2011-12-23 MED ORDER — IRON 240 (27 FE) MG PO TABS: 1.0000 | ORAL_TABLET | Freq: Every day | ORAL | Status: DC

## 2011-12-23 MED ORDER — OXYCODONE HCL 5 MG PO TABS
5.0000 mg | ORAL_TABLET | ORAL | Status: DC | PRN
  Administered 2011-12-23 – 2011-12-24 (×2): 10 mg via ORAL
  Administered 2011-12-24 (×2): 5 mg via ORAL
  Administered 2011-12-24 – 2011-12-26 (×3): 10 mg via ORAL
  Filled 2011-12-23: qty 1
  Filled 2011-12-23: qty 2
  Filled 2011-12-23: qty 1
  Filled 2011-12-23 (×5): qty 2

## 2011-12-23 MED ORDER — ACETAMINOPHEN 325 MG PO TABS
650.0000 mg | ORAL_TABLET | Freq: Four times a day (QID) | ORAL | Status: DC | PRN
  Administered 2011-12-26: 650 mg via ORAL
  Filled 2011-12-23: qty 2

## 2011-12-23 MED ORDER — BUPIVACAINE-EPINEPHRINE PF 0.25-1:200000 % IJ SOLN
INTRAMUSCULAR | Status: AC
  Filled 2011-12-23: qty 30

## 2011-12-23 MED ORDER — ONDANSETRON HCL 4 MG/2ML IJ SOLN: 4.0000 mg | Freq: Four times a day (QID) | INTRAMUSCULAR | Status: DC | PRN

## 2011-12-23 MED ORDER — ALBUTEROL SULFATE (2.5 MG/3ML) 0.083% IN NEBU
INHALATION_SOLUTION | RESPIRATORY_TRACT | Status: DC | PRN
  Administered 2011-12-23: .45 mg via RESPIRATORY_TRACT
  Administered 2011-12-23: .27 mg via RESPIRATORY_TRACT

## 2011-12-23 MED ORDER — TIMOLOL MALEATE 0.5 % OP SOLN
1.0000 [drp] | Freq: Two times a day (BID) | OPHTHALMIC | Status: DC
  Administered 2011-12-23 – 2011-12-26 (×6): 1 [drp] via OPHTHALMIC
  Filled 2011-12-23: qty 5

## 2011-12-23 SURGICAL SUPPLY — 72 items
BANDAGE ELASTIC 4 VELCRO ST LF (GAUZE/BANDAGES/DRESSINGS) ×1 IMPLANT
BANDAGE ELASTIC 6 VELCRO ST LF (GAUZE/BANDAGES/DRESSINGS) ×1 IMPLANT
BANDAGE ESMARK 6X9 LF (GAUZE/BANDAGES/DRESSINGS) ×1 IMPLANT
BLADE SAGITTAL 25.0X1.19X90 (BLADE) ×2 IMPLANT
BLADE SAW SGTL 11.0X1.19X90.0M (BLADE) IMPLANT
BLADE SAW SGTL 13.0X1.19X90.0M (BLADE) ×2 IMPLANT
BLADE SURG 10 STRL SS (BLADE) ×4 IMPLANT
BNDG CMPR 9X6 STRL LF SNTH (GAUZE/BANDAGES/DRESSINGS) ×1
BNDG CMPR MED 15X6 ELC VLCR LF (GAUZE/BANDAGES/DRESSINGS) ×1
BNDG ELASTIC 6X15 VLCR STRL LF (GAUZE/BANDAGES/DRESSINGS) ×2 IMPLANT
BNDG ESMARK 6X9 LF (GAUZE/BANDAGES/DRESSINGS) ×2
BOWL SMART MIX CTS (DISPOSABLE) ×2 IMPLANT
CEMENT HV SMART SET (Cement) ×4 IMPLANT
CLOTH BEACON ORANGE TIMEOUT ST (SAFETY) ×2 IMPLANT
COVER BACK TABLE 24X17X13 BIG (DRAPES) IMPLANT
COVER PROBE W GEL 5X96 (DRAPES) ×2 IMPLANT
COVER SURGICAL LIGHT HANDLE (MISCELLANEOUS) ×2 IMPLANT
CUFF TOURNIQUET SINGLE 34IN LL (TOURNIQUET CUFF) ×2 IMPLANT
CUFF TOURNIQUET SINGLE 44IN (TOURNIQUET CUFF) IMPLANT
DRAPE EXTREMITY T 121X128X90 (DRAPE) ×2 IMPLANT
DRAPE INCISE IOBAN 66X45 STRL (DRAPES) ×2 IMPLANT
DRAPE PROXIMA HALF (DRAPES) ×2 IMPLANT
DRAPE U-SHAPE 47X51 STRL (DRAPES) ×2 IMPLANT
DRSG ADAPTIC 3X8 NADH LF (GAUZE/BANDAGES/DRESSINGS) ×2 IMPLANT
DRSG PAD ABDOMINAL 8X10 ST (GAUZE/BANDAGES/DRESSINGS) ×3 IMPLANT
DURAPREP 26ML APPLICATOR (WOUND CARE) ×2 IMPLANT
ELECT CAUTERY BLADE 6.4 (BLADE) ×2 IMPLANT
ELECT REM PT RETURN 9FT ADLT (ELECTROSURGICAL) ×2
ELECTRODE REM PT RTRN 9FT ADLT (ELECTROSURGICAL) ×1 IMPLANT
EVACUATOR 1/8 PVC DRAIN (DRAIN) ×1 IMPLANT
FACESHIELD LNG OPTICON STERILE (SAFETY) ×2 IMPLANT
GLOVE BIO SURGEON STRL SZ7 (GLOVE) ×2 IMPLANT
GLOVE BIOGEL PI IND STRL 7.0 (GLOVE) ×1 IMPLANT
GLOVE BIOGEL PI IND STRL 7.5 (GLOVE) ×1 IMPLANT
GLOVE BIOGEL PI INDICATOR 7.0 (GLOVE) ×1
GLOVE BIOGEL PI INDICATOR 7.5 (GLOVE) ×1
GLOVE SS BIOGEL STRL SZ 7.5 (GLOVE) ×1 IMPLANT
GLOVE SUPERSENSE BIOGEL SZ 7.5 (GLOVE) ×1
GOWN PREVENTION PLUS XLARGE (GOWN DISPOSABLE) ×4 IMPLANT
GOWN STRL NON-REIN LRG LVL3 (GOWN DISPOSABLE) ×4 IMPLANT
HANDPIECE INTERPULSE COAX TIP (DISPOSABLE) ×2
HOOD PEEL AWAY FACE SHEILD DIS (HOOD) ×4 IMPLANT
IMMOBILIZER KNEE 22 UNIV (SOFTGOODS) ×1 IMPLANT
INSERT CUSHION PRONEVIEW LG (MISCELLANEOUS) ×2 IMPLANT
KIT BASIN OR (CUSTOM PROCEDURE TRAY) ×2 IMPLANT
KIT ROOM TURNOVER OR (KITS) ×2 IMPLANT
MANIFOLD NEPTUNE II (INSTRUMENTS) ×2 IMPLANT
NS IRRIG 1000ML POUR BTL (IV SOLUTION) ×2 IMPLANT
PACK TOTAL JOINT (CUSTOM PROCEDURE TRAY) ×2 IMPLANT
PAD ARMBOARD 7.5X6 YLW CONV (MISCELLANEOUS) ×4 IMPLANT
PAD CAST 4YDX4 CTTN HI CHSV (CAST SUPPLIES) ×1 IMPLANT
PADDING CAST ABS 4INX4YD NS (CAST SUPPLIES) ×1
PADDING CAST ABS COTTON 4X4 ST (CAST SUPPLIES) IMPLANT
PADDING CAST COTTON 4X4 STRL (CAST SUPPLIES) ×2
PADDING CAST COTTON 6X4 STRL (CAST SUPPLIES) ×2 IMPLANT
POSITIONER HEAD PRONE TRACH (MISCELLANEOUS) ×2 IMPLANT
RUBBERBAND STERILE (MISCELLANEOUS) ×2 IMPLANT
SET HNDPC FAN SPRY TIP SCT (DISPOSABLE) ×1 IMPLANT
SPONGE GAUZE 4X4 12PLY (GAUZE/BANDAGES/DRESSINGS) ×2 IMPLANT
STRIP CLOSURE SKIN 1/2X4 (GAUZE/BANDAGES/DRESSINGS) ×2 IMPLANT
SUCTION FRAZIER TIP 10 FR DISP (SUCTIONS) ×2 IMPLANT
SUT MNCRL AB 3-0 PS2 18 (SUTURE) ×2 IMPLANT
SUT VIC AB 0 CT1 27 (SUTURE) ×4
SUT VIC AB 0 CT1 27XBRD ANBCTR (SUTURE) ×2 IMPLANT
SUT VIC AB 2-0 CT1 27 (SUTURE) ×4
SUT VIC AB 2-0 CT1 TAPERPNT 27 (SUTURE) ×2 IMPLANT
SUT VLOC 180 0 24IN GS25 (SUTURE) ×2 IMPLANT
SYR 30ML SLIP (SYRINGE) ×2 IMPLANT
TOWEL OR 17X24 6PK STRL BLUE (TOWEL DISPOSABLE) ×2 IMPLANT
TOWEL OR 17X26 10 PK STRL BLUE (TOWEL DISPOSABLE) ×2 IMPLANT
TRAY FOLEY CATH 14FR (SET/KITS/TRAYS/PACK) ×2 IMPLANT
WATER STERILE IRR 1000ML POUR (IV SOLUTION) ×6 IMPLANT

## 2011-12-23 NOTE — Anesthesia Preprocedure Evaluation (Addendum)
Anesthesia Evaluation  Patient identified by MRN, date of birth, ID band Patient awake    Reviewed: Allergy & Precautions, H&P , NPO status , Patient's Chart, lab work & pertinent test results, reviewed documented beta blocker date and time   Airway Mallampati: II TM Distance: >3 FB   Mouth opening: Limited Mouth Opening  Dental  (+) Dental Advisory Given and Teeth Intact   Pulmonary asthma ,  breath sounds clear to auscultation        Cardiovascular hypertension, Pt. on medications and Pt. on home beta blockers Rhythm:Regular Rate:Normal     Neuro/Psych    GI/Hepatic GERD-  Medicated and Controlled,  Endo/Other    Renal/GU      Musculoskeletal   Abdominal   Peds  Hematology   Anesthesia Other Findings Somewhat narrow mouth  Reproductive/Obstetrics                         Anesthesia Physical Anesthesia Plan  ASA: III  Anesthesia Plan: General   Post-op Pain Management:    Induction: Intravenous  Airway Management Planned: LMA  Additional Equipment:   Intra-op Plan:   Post-operative Plan: Extubation in OR  Informed Consent: I have reviewed the patients History and Physical, chart, labs and discussed the procedure including the risks, benefits and alternatives for the proposed anesthesia with the patient or authorized representative who has indicated his/her understanding and acceptance.   Dental advisory given  Plan Discussed with: CRNA, Anesthesiologist and Surgeon  Anesthesia Plan Comments:         Anesthesia Quick Evaluation

## 2011-12-23 NOTE — Progress Notes (Signed)
UR COMPLETED  

## 2011-12-23 NOTE — Preoperative (Signed)
Beta Blockers   Reason not to administer Beta Blockers:Lopressor at 0600 today

## 2011-12-23 NOTE — Interval H&P Note (Signed)
History and Physical Interval Note:  12/23/2011 9:05 AM  Kristin Solomon  has presented today for surgery, with the diagnosis of DJD RT KNEE  The various methods of treatment have been discussed with the patient and family. After consideration of risks, benefits and other options for treatment, the patient has consented to  Procedure(s) (LRB): TOTAL KNEE ARTHROPLASTY (Right) as a surgical intervention .  The patient's history has been reviewed, patient examined, no change in status, stable for surgery.  I have reviewed the patients' chart and labs.  Questions were answered to the patient's satisfaction.     [name]

## 2011-12-23 NOTE — Transfer of Care (Signed)
Immediate Anesthesia Transfer of Care Note  Patient: Kristin Solomon  Procedure(s) Performed: Procedure(s) (LRB): TOTAL KNEE ARTHROPLASTY (Right)  Patient Location: PACU  Anesthesia Type: General  Level of Consciousness: awake, alert , oriented and patient cooperative  Airway & Oxygen Therapy: Patient Spontanous Breathing and Patient connected to nasal cannula oxygen  Post-op Assessment: Report given to PACU RN, Post -op Vital signs reviewed and stable and Patient moving all extremities  Post vital signs: Reviewed and stable  Complications: No apparent anesthesia complications

## 2011-12-23 NOTE — Progress Notes (Signed)
Orthopedic Tech Progress Note Patient Details:  Kristin Solomon 09/22/1929 [id number]  CPM Right Knee CPM Right Knee: On Right Knee Flexion (Degrees): 90    [name] T 12/23/2011, 12:22 PM

## 2011-12-23 NOTE — H&P (View-Only) (Signed)
Kristin Solomon is an 78 y.o. female.   Chief Complaint: right knee djd HPI: Kristin Solomon is seen for follow-up having increased right knee pain with known degenerative joint disease. This is getting progressively worse. She had lumbar spine surgery on 08/29/10 by Dr. Newell Coral but does still have left thigh pain and left leg pain.  She has known chronic rotator cuff tear of her shoulders with rotator cuff arthropathy. She had a left total knee replacement 7 years ago and this is doing well. She has history of iron deficiency anemia.  The excruciating pain is getting progressively worse, limiting activities of daily living, poses a significant fall risk, and has failed multiple conservative treatments including intraarticular cortisone injections, intraarticular Supartz, bracing, medication and home physical therapy.  Past Medical History  Diagnosis Date  . Hyperlipidemia   . Hypertension   . Tachycardia   . Depression   . Herpes ocular   . Anemia, iron deficiency     Past Surgical History  Procedure Date  . Back surgery   . Rotator cuff repair w/ distal clavicle excision   . Joint replacement 2005    left total knee  . Abdominal hysterectomy 1995    Family History  Problem Relation Age of Onset  . Heart attack Mother   . Parkinsonism Father   . Diabetes Brother    Social History:  reports that she has quit smoking. She does not have any smokeless tobacco history on file. She reports that she does not drink alcohol. Her drug history not on file.  Allergies: No Known Allergies Current Outpatient Prescriptions on File Prior to Visit  Medication Sig Dispense Refill  . aspirin EC 81 MG tablet Take 81 mg by mouth daily.      . brimonidine-timolol (COMBIGAN) 0.2-0.5 % ophthalmic solution Place 1 drop into the left eye 2 (two) times daily.      . cholecalciferol (VITAMIN D) 1000 UNITS tablet Take 2,000 Units by mouth daily.      Tery Sanfilippo Calcium (STOOL SOFTENER PO) Take 1 tablet by  mouth daily.      . Ferrous Gluconate (IRON) 240 (27 FE) MG TABS Take 1 tablet by mouth daily.      . metoprolol (LOPRESSOR) 50 MG tablet Take 25-50 mg by mouth 2 (two) times daily. Take 50 MG in the morning and take 25 MG at bedtime.      Marland Kitchen omeprazole (PRILOSEC OTC) 20 MG tablet Take 20 mg by mouth 2 (two) times daily.      . prednisoLONE acetate (PRED FORTE) 1 % ophthalmic suspension Place 1 drop into the left eye daily.      Marland Kitchen senna (SENOKOT) 8.6 MG TABS Take 1 tablet by mouth.      . valACYclovir (VALTREX) 500 MG tablet Take 1,000 mg by mouth daily.      Marland Kitchen venlafaxine XR (EFFEXOR-XR) 75 MG 24 hr capsule Take 75 mg by mouth at bedtime.        (Not in a hospital admission)  No results found for this or any previous visit (from the past 48 hour(s)). No results found.  Review of Systems  Constitutional: Negative.   HENT: Negative.   Eyes: Negative.   Respiratory: Negative.   Cardiovascular: Negative.   Gastrointestinal: Negative.   Genitourinary: Negative.   Musculoskeletal: Positive for joint pain.       Right knee  Skin: Negative.   Neurological: Negative.   Endo/Heme/Allergies: Negative.   Psychiatric/Behavioral: Negative.  Blood pressure 122/72, pulse 82, temperature 98.3 F (36.8 C), height 5\' 1"  (1.549 m), weight 63.504 kg (140 lb), SpO2 92.00%. Physical Exam  Constitutional: She is oriented to person, place, and time. She appears well-developed and well-nourished.  HENT:  Head: Normocephalic and atraumatic.  Mouth/Throat: Oropharynx is clear and moist.  Eyes: Conjunctivae are normal. Pupils are equal, round, and reactive to light.  Neck: Normal range of motion. Neck supple.  Cardiovascular: Normal rate and regular rhythm.   Respiratory: Effort normal and breath sounds normal.  GI: Soft. Bowel sounds are normal.  Genitourinary:       Not pertinent to current symptomatology therefore not examined.  Musculoskeletal:       Examination of her right knee reveals  moderate valgus deformity 1 to 2+ crepitation 1+ synovitis range of motion from -5 to 120 degrees knee is stable with normal patella tracking. Exam of her left knee reveals well healed total knee incision without swelling or pain, range of motion 0-115 degrees knee is stable with normal patella tracking. Exam of her left hip reveals left SI joint pain no groin pain no lateral bursa pain, full range of motion of the hip with pain in the posterolateral hip and SI joint. Exam of the right hip reveals full range of motion without pain. Vascular exam: pulses 2+ and symmetric.  Neurological: She is alert and oriented to person, place, and time.  Skin: Skin is warm and dry.  Psychiatric: She has a normal mood and affect. Her behavior is normal. Thought content normal.     Assessment Patient Active Problem List  Diagnosis  . Hyperlipidemia  . Hypertension  . Tachycardia  . Herpes ocular  . Anemia, iron deficiency  . Right knee DJD  . Status post total knee replacement    Plan I talk to her in detail about this. I think at this point she is a candidate for right total knee replacement. Discussed risks benefits and possible complications of the surgery in detail and she understands this completely. She is cleared preoperatively by Dr. Kevan Ny. She is going to [facility] oper[facility] for skilled care  Pascal Lux 12/10/2011, 4:34 PM

## 2011-12-23 NOTE — Anesthesia Procedure Notes (Addendum)
Anesthesia Regional Block:  Femoral nerve block  Pre-Anesthetic Checklist: ,, timeout performed, Correct Patient, Correct Site, Correct Laterality, Correct Procedure, Correct Position, site marked, Risks and benefits discussed,  Surgical consent,  Pre-op evaluation,  At surgeon's request and post-op pain management  Laterality: Right and Lower  Prep: chloraprep       Needles:  Injection technique: Single-shot  Needle Type: Echogenic Needle     Needle Length: 9cm  Needle Gauge: 22 and 22 G    Additional Needles:  Procedures: ultrasound guided Femoral nerve block Narrative:  Start time: 12/23/2011 8:55 AM End time: 12/23/2011 9:05 AM Injection made incrementally with aspirations every 5 mL.  Performed by: Personally  Anesthesiologist: Sheldon Silvan, MD  Additional Notes: Marcaine 0.5% with EPI 1:200000  25 ml  Femoral nerve block Procedure Name: LMA Insertion Date/Time: 12/23/2011 9:24 AM Performed by: Darcey Nora B Pre-anesthesia Checklist: Patient identified, Emergency Drugs available, Suction available and Patient being monitored Patient Re-evaluated:Patient Re-evaluated prior to inductionOxygen Delivery Method: Circle system utilized Preoxygenation: Pre-oxygenation with 100% oxygen Intubation Type: IV induction Ventilation: Mask ventilation without difficulty LMA: LMA with gastric port inserted LMA Size: 4.0 Number of attempts: 2 (small mouth; effort to place) Placement Confirmation: positive ETCO2 and breath sounds checked- equal and bilateral ETT to lip (cm): taped across flange to cheeks. Tube secured with: Tape Dental Injury: Teeth and Oropharynx as per pre-operative assessment

## 2011-12-23 NOTE — Op Note (Signed)
MRN:     [id number] DOB/AGE:    1929-10-08 / 78 y.o.       OPERATIVE REPORT y.o.       OPERATIVE REPORT    DATE OF PROCEDURE:  12/23/2011       PREOPERATIVE DIAGNOSIS:   Degenerative Joint Disease Right Knee      There is no height or weight on file to calculate BMI.                                                        POSTOPERATIVE DIAGNOSIS:   Degenerative Joint Disease Right Knee                                                                      PROCEDURE:  Procedure(s): TOTAL KNEE ARTHROPLASTY Using Depuy Sigma RP implants #2.5 Femur, #3 revisioinTibia, 12.5 mm sigma RP bearing, 32 Patella     SURGEON: Lavert Matousek A    ASSISTANT:  Kirstin Shepperson PA-C   (Present and scrubbed throughout the case, critical for assistance with exposure, retraction, instrumentation, and closure.)         ANESTHESIA: GET with Femoral Nerve Block  DRAINS: foley, 2 medium hemovac in knee   TOURNIQUET TIME:   COMPLICATIONS:  None     SPECIMENS: None   INDICATIONS FOR PROCEDURE: The patient has  Degenerative Joint Disease Right Knee, valgus deformities, XR shows bone on bone arthritis. Patient has failed all conservative measures including anti-inflammatory medicines, narcotics, attempts at  exercise and weight loss, cortisone injections and viscosupplementation.  Risks and benefits of surgery have been discussed, questions answered.   DESCRIPTION OF PROCEDURE: The patient identified by armband, received  right femoral nerve block and IV antibiotics, in the holding area at [facility]. Patient taken to the operating room, appropriate anesthetic  monitors were attached General endotracheal anesthesia induced with  the patient in supine position, Foley catheter was inserted. Tourniquet  applied high to the operative thigh. Lateral post and foot positioner  applied to the table, the lower extremity was then prepped and draped  in usual sterile fashion from the ankle to the tourniquet. Time-out procedure was  performed. The limb was wrapped with an Esmarch bandage and the tourniquet inflated to 365 mmHg. We began the operation by making the anterior midline incision starting at handbreadth above the patella going over the patella 1 cm medial to and  4 cm distal to the tibial tubercle. Small bleeders in the skin and the  subcutaneous tissue identified and cauterized. Transverse retinaculum was incised and reflected medially and a medial parapatellar arthrotomy was accomplished. the patella was everted and theprepatellar fat pad resected. The superficial medial collateral  ligament was then elevated from anterior to posterior along the proximal  flare of the tibia and anterior half of the menisci resected. The knee was hyperflexed exposing bone on bone arthritis. Peripheral and notch osteophytes as well as the cruciate ligaments were then resected. We continued to  work our way around posteriorly along the proximal tibia, and externally  rotated the tibia subluxing it out from underneath the femur. A McHale  retractor  was placed through the notch and a lateral Hohmann retractor  placed, and we then drilled through the proximal tibia in line with the  axis of the tibia followed by an intramedullary guide rod and 2-degree  posterior slope cutting guide. The tibial cutting guide was pinned into place  allowing resection of 6 mm of bone medially and about 2 mm of bone  laterally because of her valgus deformity. Satisfied with the tibial resection, we then  entered the distal femur 2 mm anterior to the PCL origin with the  intramedullary guide rod and applied the distal femoral cutting guide  set at 11mm, with 5 degrees of valgus. This was pinned along the  epicondylar axis. At this point, the distal femoral cut was accomplished without difficulty. We then sized for a #2.5 femoral component and pinned the guide in 3 degrees of external rotation.The chamfer cutting guide was pinned into place. The anterior,  posterior, and chamfer cuts were accomplished without difficulty followed by  the Sigma RP box cutting guide and the box cut. We also removed posterior osteophytes from the posterior femoral condyles. At this  time, the knee was brought into full extension. We checked our  extension and flexion gaps and found them symmetric at 10mm.  The patella thickness measured at 21 mm. We set the cutting guide at 14 and removed the posterior 8.5 mm  of the patella sized for 32 button and drilled the lollipop. The knee  was then once again hyperflexed exposing the proximal tibia. We sized for a #3 revision tibial base plate due to her severe osteroporosis, applied the smokestack and the conical reamer followed by the the Delta fin keel punch. We then hammered into place the Sigma RP trial femoral component, inserted a 12.5-mm trial bearing, trial patellar button, and took the knee through range of motion from 0-130 degrees. No thumb pressure was required for patellar  tracking. At this point, all trial components were removed, a double batch of DePuy HV cement with 1500 mg of Zinacef was mixed and applied to all bony metallic mating surfaces except for the posterior condyles of the femur itself. In order, we  hammered into place the tibial tray and removed excess cement, the femoral component and removed excess cement, a 12.5-mm Sigma RP bearing  was inserted, and the knee brought to full extension with compression.  The patellar button was clamped into place, and excess cement  removed. While the cement cured the wound was irrigated out with normal saline solution pulse lavage, and medium Hemovac drains were placed.. Ligament stability and patellar tracking were checked and found to be excellent. The tourniquet was then released and hemostasis was obtained with cautery. The parapatellar arthrotomy was closed with  Z lock suture. The subcutaneous tissue with 0 and 2-0 undyed  Vicryl suture, and 4-0 Monocryl.. A  dressing of Xeroform,  4 x 4, dressing sponges, Webril, and Ace wrap applied. Needle and sponge count were correct times 2.The patient awakened, extubated, and taken to recovery room without difficulty. Vascular status was normal, pulses 2+ and symmetric.   Kristin Solomon A 12/23/2011, 10:42 AM

## 2011-12-23 NOTE — Anesthesia Postprocedure Evaluation (Signed)
  Anesthesia Post-op Note  Patient: Kristin Solomon  Procedure(s) Performed: Procedure(s) (LRB): TOTAL KNEE ARTHROPLASTY (Right)  Patient Location: PACU  Anesthesia Type: GA combined with regional for post-op pain  Level of Consciousness: awake, alert  and oriented  Airway and Oxygen Therapy: Patient Spontanous Breathing and Patient connected to nasal cannula oxygen  Post-op Pain: mild  Post-op Assessment: Post-op Vital signs reviewed  Post-op Vital Signs: Reviewed  Complications: No apparent anesthesia complications

## 2011-12-24 ENCOUNTER — Encounter (HOSPITAL_COMMUNITY): Payer: Self-pay | Admitting: Orthopedic Surgery

## 2011-12-24 LAB — CBC
HCT: 27.5 % — ABNORMAL LOW (ref 36.0–46.0)
Hemoglobin: 9.4 g/dL — ABNORMAL LOW (ref 12.0–15.0)
MCH: 32.9 pg (ref 26.0–34.0)
MCHC: 34.2 g/dL (ref 30.0–36.0)
MCV: 96.2 fL (ref 78.0–100.0)
RDW: 12.5 % (ref 11.5–15.5)

## 2011-12-24 LAB — BASIC METABOLIC PANEL
BUN: 9 mg/dL (ref 6–23)
Creatinine, Ser: 0.54 mg/dL (ref 0.50–1.10)
GFR calc Af Amer: >90 mL/min (ref >90)
GFR calc non Af Amer: 87 mL/min — ABNORMAL LOW (ref >90)
Glucose, Bld: 157 mg/dL — ABNORMAL HIGH (ref 70–99)

## 2011-12-24 MED ORDER — SODIUM CHLORIDE 0.9 % IV BOLUS (SEPSIS)
500.0000 mL | Freq: Once | INTRAVENOUS | Status: AC
  Administered 2011-12-24: 500 mL via INTRAVENOUS

## 2011-12-24 NOTE — Progress Notes (Signed)
Physical Therapy Treatment Note   12/24/11 1139  PT Visit Information  Last PT Received On 12/24/11  Assistance Needed +1  PT Time Calculation  PT Start Time 1139  PT Stop Time 1154  PT Time Calculation (min) 15 min  Subjective Data  Subjective Pt received supine in bed with report of R knee pain but just received pain medicine. Patient did not rate pain.  Precautions  Precautions Knee  Required Braces or Orthoses Knee Immobilizer - Right  Knee Immobilizer - Right On when out of bed or walking  Restrictions  RLE Weight Bearing WBAT  Cognition  Overall Cognitive Status Appears within functional limits for tasks assessed/performed  Arousal/Alertness Awake/alert  Orientation Level Appears intact for tasks assessed  Behavior During Session [facility] for tasks performed  Bed Mobility  Bed Mobility Supine to Sit  Supine to Sit 4: Min assist;HOB elevated;With rails  Details for Bed Mobility Assistance minA for R LE managment, v/c's for technique, minA to scoot to edge of bed  Transfers  Transfers Sit to Stand;Stand to Sit  Sit to Stand 4: Min assist;With upper extremity assist;From bed  Stand to Sit 4: Min assist;With upper extremity assist;With armrests;To chair/3-in-1  Details for Transfer Assistance v/c's for safety, hand placement, R LE managment  Ambulation/Gait  Ambulation/Gait Assistance 4: Min assist  Ambulation Distance (Feet) 75 Feet  Assistive device Rolling walker  Ambulation/Gait Assistance Details pt with improved sequencing but con't to have decreased R LE WBing relying on bilat UEs, v/c's for walker safety, R KI utilized  Gait Pattern Step-through pattern;Decreased step length - right;Decreased stance time - right;Antalgic  Stairs No  PT - End of Session  Equipment Utilized During Treatment Gait belt;Right knee immobilizer  Activity Tolerance Patient limited by pain  Patient left in chair;with call bell/phone within reach  PT - Assessment/Plan  Comments on Treatment  Session Pt with increased pain this PM. pt with improved ambulation tolerance this date. Encourage patient to use yellow wedge to promote post knee stretch.  PT Plan Discharge plan remains appropriate;Frequency remains appropriate  PT Frequency 7X/week  Follow Up Recommendations Skilled nursing facility  Equipment Recommended Defer to next venue  Acute Rehab PT Goals  PT Goal: Supine/Side to Sit - Progress Progressing toward goal  PT Goal: Sit to Stand - Progress Progressing toward goal  PT Goal: Ambulate - Progress Progressing toward goal  PT Goal: Perform Home Exercise Program - Progress Progressing toward goal  PT General Charges  $$ ACUTE PT VISIT 1 Procedure  PT Treatments  $Gait Training 8-22 mins     Lewis Shock, PT, DPT Pager #: [phone] Office #: [phone]

## 2011-12-24 NOTE — Clinical Social Work Placement (Addendum)
    Clinical Social Work Department CLINICAL SOCIAL WORK PLACEMENT NOTE 12/24/2011  Patient:  Kristin Solomon, Kristin Solomon  Account Number:  [id number] Admit date:  12/23/2011  Clinical Social Worker:  Lupita Leash Lougenia Morrissey, BSW  Date/time:  12/24/2011 05:02 PM  Clinical Social Work is seeking post-discharge placement for this patient at the following level of care:   SKILLED NURSING   (*CSW will update this form in Epic as items are completed)   12/24/2011  Patient/family provided with [facility] Department of Clinical Social Work's list of facilities offering this level of care within the geographic area requested by the patient (or if unable, by the patient's family).  12/24/2011  Patient/family informed of their freedom to choose among providers that offer the needed level of care, that participate in Medicare, Medicaid or managed care program needed by the patient, have an available bed and are willing to accept the patient.  12/24/2011  Patient/family informed of [facility]' ownership interest in [facility], as well as of the fact that they are under no obligation to receive care at this facility.  PASARR submitted to EDS on 12/24/2011 PASARR number received from EDS on 12/24/2011  FL2 transmitted to all facilities in geographic area requested by pt/family on 12/24/2011  FL2 transmitted to all facilities within larger geographic area on   Patient informed that his/her managed care company has contracts with or will negotiate with  certain facilities, including the following:   NA     Patient/family informed of bed offers received:  12/24/2011 Patient chooses bed at [facility] AT [facility] Physician recommends and patient chooses bed at    Patient to be transferred to [facility] AT [facility] on   Patient to be transferred to facility by [facility]  The following physician request were entered in Epic:   Additional Comments: Notified patient of d/c plan for tomorrow. DC summary  and AVS sent to [facility]; scripts also sent per request of Fritzi Mandes, [place].  Patient states her daughter will be in from [place] around 11:30. Plan d/c via EMS around 1 pm tomorrow.    12/26/11  Patient will received 2 units of blood, then is stable for d/c to [facility] this afternoon. Notified Damesha at [facility] of delay in d/c. Nursing will call in report.  Patient is pleased with d/c plan. Notified SNF and pt's nurse- Healther.

## 2011-12-24 NOTE — Progress Notes (Signed)
Order received, chart reviewed, noted pt is to go to the SNF section of the Independent Living facility of which she resides. Will defer OT eval to that facility. Acute OT will sign off.  Ignacia Palma, [place] [phone] 12/24/2011

## 2011-12-24 NOTE — Care Management Note (Signed)
    Page 1 of 1   12/24/2011     10:36:28 AM   CARE MANAGEMENT NOTE 12/24/2011  Patient:  Kristin Solomon, Kristin Solomon   Account Number:  [id number]  Date Initiated:  12/24/2011  Documentation initiated by:  Anette Guarneri  Subjective/Objective Assessment:   POD#1 s/p right TKA  from independent living facility  plans to d/c to SNF     Action/Plan:   CSW referral   Anticipated DC Date:  12/26/2011   Anticipated DC Plan:  SKILLED NURSING FACILITY         Choice offered to / List presented to:             Status of service:  In process, will continue to follow Medicare Important Message given?   (If response is "NO", the following Medicare IM given date fields will be blank) Date Medicare IM given:   Date Additional Medicare IM given:    Discharge Disposition:    Per UR Regulation:  Reviewed for med. necessity/level of care/duration of stay  If discussed at Long Length of Stay Meetings, dates discussed:    Comments:  12/24/11 10:34  Anette Guarneri RN/CM spoke with patient regarding d/c planning, patient plans to d/c to SNF, perferes [facility] [place] CSW referral made.

## 2011-12-24 NOTE — Progress Notes (Signed)
Patient ID: Kristin Solomon, female   DOB: [birth year], 78 y.o.   MRN: [id number] PATIENT ID: Kristin Solomon  MRN: [id number]  DOB/AGE:  [birth year] / 78 y.o.  1 Day Post-Op Procedure(s) (LRB): TOTAL KNEE ARTHROPLASTY (Right)    PROGRESS NOTE Subjective: Patient is alert, oriented, no Nausea, no Vomiting, yes passing gas, no Bowel Movement. Taking PO well. Denies SOB, Chest or Calf Pain. Using Incentive Spirometer, PAS in place. Ambulate not yet, CPM 0-90 Patient reports pain as 3 on 0-10 scale  .    Objective: Vital signs in last 24 hours: Filed Vitals:   12/23/11 1708 12/23/11 2305 12/24/11 0222 12/24/11 0705  BP: 121/62 130/68 132/66 130/68  Pulse: 76 93 90 88  Temp: 97.9 F (36.6 C) 97.5 F (36.4 C) 97.6 F (36.4 C) 97.8 F (36.6 C)  TempSrc:      Resp: 16 20 20 20   SpO2: 98% 97% 98% 97%      Intake/Output from previous day: I/O last 3 completed shifts: In: 3106.7 [P.O.:240; I.V.:2866.7] Out: 1000 [Urine:700; Drains:250; Blood:50]   Intake/Output this shift:     LABORATORY DATA:  Basename 12/24/11 0415  WBC 10.5  HGB 9.4*  HCT 27.5*  PLT 209  NA 135  K 4.2  CL 104  CO2 25  BUN 9  CREATININE 0.54  GLUCOSE 157*  GLUCAP --  INR --  CALCIUM 8.2*    Examination: Neurologically intact ABD soft Neurovascular intact Sensation intact distally Intact pulses distally Dorsiflexion/Plantar flexion intact Incision: dressing C/D/I}  Assessment:   1 Day Post-Op Procedure(s) (LRB): TOTAL KNEE ARTHROPLASTY (Right) ADDITIONAL DIAGNOSIS:  Acute Blood Loss Anemia Patient Active Problem List  Diagnosis  . Hyperlipidemia  . Hypertension  . Tachycardia  . Herpes ocular  . Anemia, iron deficiency  . Right knee DJD  . Status post total knee replacement  . Lumbar radiculopathy, chronic   Plan: PT/OT WBAT, CPM 6/hrs day ROM 0-90 degrees DVT Prophylaxis:  SCDx72hrs,Lovenox DISCHARGE PLAN: Skilled Nursing Facility/Rehab DISCHARGE NEEDS: fluid bolus times 2    dressing change   saline lock     Roxy Filler J 12/24/2011, 8:22 AM

## 2011-12-24 NOTE — Clinical Social Work Psychosocial (Signed)
     Clinical Social Work Department BRIEF PSYCHOSOCIAL ASSESSMENT 12/24/2011  Patient:  Kristin Solomon, Kristin Solomon     Account Number:  [id number]     Admit date:  12/23/2011  Clinical Social Worker:  Burnard Hawthorne  Date/Time:  12/24/2011 04:11 PM  Referred by:  Physician  Date Referred:  12/24/2011 Referred for  SNF Placement   Other Referral:   Interview type:  Patient Other interview type:    PSYCHOSOCIAL DATA Living Status:  FACILITY Admitted from facility:  [facility] Level of care:  Independent Living Primary support name:  Kimyetta Flott    [phone] Primary support relationship to patient:  CHILD, ADULT Degree of support available:   Strong supportive family    CURRENT CONCERNS Current Concerns  Post-Acute Placement   Other Concerns:    SOCIAL WORK ASSESSMENT / PLAN Patient referred to CSW for short term SNF placement. Patient currently lives in an independent apartment at [facility]. She states that she is hoping to receive SNF placement at [facility] but if not beds- she wants [facility]. Her husband currently resides in the dementia unit at [facility] and she wants to remain close to him if possible.  Message left for Janie in [facility] at [facility] to return call   Assessment/plan status:  Psychosocial Support/Ongoing Assessment of Needs Other assessment/ plan:   Patient requests [facility] if she is unable to go to SNF at [facility]   Information/referral to community resources:   SNF list placed in room but patient has very strong feelings about placement at [facility]    PATIENTS/FAMILYS RESPONSE TO PLAN OF CARE: Patient is alert, oriented and very pleasant. She plans to return to her independent living apartment after rehab. She has a positive attitude about placement. Patient states she is a retired Engineer, civil (consulting) with [employer].

## 2011-12-24 NOTE — Progress Notes (Signed)
Physical Therapy Evaluation Note  Past Medical History  Diagnosis Date  . Hyperlipidemia   . Tachycardia   . Herpes ocular   . Right knee DJD   . Status post total knee replacement 2005    left total knee  . Complication of anesthesia     told she has a small airway  . Hypertension     takes metoprolol twice/day  . Depression     takes effexor daily  . GERD (gastroesophageal reflux disease)     takes prilosec daily  . Anemia, iron deficiency     hx of blood transfusion    Past Surgical History  Procedure Date  . Back surgery   . Rotator cuff repair w/ distal clavicle excision   . Joint replacement 2005    left total knee  . Abdominal hysterectomy 1995  . Hiatal hernia repair 2002  . Eye surgery 2012    corneal transplant - left  . Tonsillectomy     as child  . Colonoscopy      12/24/11 0745  PT Visit Information  Last PT Received On 12/24/11  Assistance Needed +1  PT Time Calculation  PT Start Time 0745  PT Stop Time 0823  PT Time Calculation (min) 38 min  Subjective Data  Subjective Pt received supine in bed report of 0/10 R knee pain  Precautions  Precautions Knee  Required Braces or Orthoses Knee Immobilizer - Right  Knee Immobilizer - Right On when out of bed or walking  Restrictions  Weight Bearing Restrictions Yes  RLE Weight Bearing WBAT  Home Living  Lives With ([facility] living at [facility] center)  Available Help at Discharge Available 24 hours/day;Skilled Nursing Facility  Type of Home Independent living facility  Home Access Level entry  Home Layout One level  Bathroom Shower/Tub Walk-in Pension scheme manager Yes  How Accessible Accessible via walker  Home Adaptive Equipment Straight cane  Additional Comments pt going to SNF part of retirment center  Prior Function  Level of Independence Independent with assistive device(s)  Able to Take Stairs? Yes  Driving No  Vocation Retired  Comments pt used  a straight cane prior to surgery but reports "i probably should've used a RW."  Advertising account planner  Overall Cognitive Status Appears within functional limits for tasks assessed/performed  Arousal/Alertness Awake/alert  Orientation Level Appears intact for tasks assessed  Behavior During Session [facility] for tasks performed  Right Upper Extremity Assessment  RUE ROM/Strength/Tone Deficits  RUE ROM/Strength/Tone Deficits R shoulder flexion limited due to previous surgery  Left Upper Extremity Assessment  LUE ROM/Strength/Tone Deficits (limited shoulder ROM )  Right Lower Extremity Assessment  RLE ROM/Strength/Tone Deficits;Due to pain  RLE ROM/Strength/Tone Deficits able to initiate quad set, hip/ankle WFL  Left Lower Extremity Assessment  LLE ROM/Strength/Tone WFL for tasks assessed  Trunk Assessment  Trunk Assessment Normal  Bed Mobility  Bed Mobility Supine to Sit  Supine to Sit 4: Min assist;HOB elevated;With rails  Details for Bed Mobility Assistance minA for R LE managment, v/c's for technique, minA to scoot to edge of bed  Transfers  Transfers Sit to Stand;Stand to Sit  Sit to Stand 4: Min assist;With upper extremity assist;From bed  Stand to Sit 4: Min assist;With upper extremity assist;With armrests;To chair/3-in-1  Details for Transfer Assistance v/c's for safety, hand placement, R LE managment  Ambulation/Gait  Ambulation/Gait Assistance 4: Min assist  Ambulation Distance (Feet) 40 Feet  Assistive device  Rolling walker  Ambulation/Gait Assistance Details max directional v/c's for sequencing, pt with increased bilat UE WBing allowing for step thru pattern, encouraged increased R LE WBing with R KI  Gait Pattern Step-through pattern;Decreased step length - right;Decreased stance time - right;Antalgic  Stairs No  Exercises  Exercises Total Joint (handout provided)  Total Joint Exercises  Ankle Circles/Pumps AROM;Both;10 reps;Supine  Quad Sets  AROM;Right;10 reps;Supine  Heel Slides Right;10 reps;AAROM;Supine  Goniometric ROM 55 active R knee flex  PT - End of Session  Equipment Utilized During Treatment Gait belt;Right knee immobilizer  Activity Tolerance Patient tolerated treatment well  Patient left in chair;with call bell/phone within reach  Nurse Communication Mobility status  PT Assessment  Clinical Impression Statement Pt s/p R TKA presenting with increased R LE pain, decreased R knee ROM, decreased R LE strength and currently requires increased assist for all mobility. Patient to benefit from SNF placement upon d/c due to patient living in retirement center alone with no assist. Patient reports desire to go to SNF. Pt to benefit from SNF to achieve maximal functional recovery for safe transition back to Independent living.  PT Recommendation/Assessment Patient needs continued PT services  PT Problem List Decreased strength;Decreased range of motion;Decreased activity tolerance;Decreased balance  PT Therapy Diagnosis  Difficulty walking;Abnormality of gait;Generalized weakness;Acute pain  PT Plan  PT Frequency 7X/week  PT Treatment/Interventions DME instruction;Gait training;Stair training;Functional mobility training;Therapeutic activities;Therapeutic exercise  PT Recommendation  Follow Up Recommendations Skilled nursing facility  Equipment Recommended Defer to next venue  Individuals Consulted  Consulted and Agree with Results and Recommendations Patient  Acute Rehab PT Goals  PT Goal Formulation With patient  Time For Goal Achievement 01/07/12  Potential to Achieve Goals Good  Pt will go Supine/Side to Sit with modified independence;with HOB 0 degrees  PT Goal: Supine/Side to Sit - Progress Goal set today  Pt will go Sit to Stand with modified independence;with upper extremity assist (up to RW.)  PT Goal: Sit to Stand - Progress Goal set today  Pt will Ambulate >150 feet;with modified independence;with rolling walker   PT Goal: Ambulate - Progress Goal set today  Pt will Perform Home Exercise Program Independently  PT Goal: Perform Home Exercise Program - Progress Goal set today  PT General Charges  $$ ACUTE PT VISIT 1 Procedure  PT Evaluation  $Initial PT Evaluation Tier II 1 Procedure  PT Treatments  $Therapeutic Exercise 8-22 mins  Written Expression  Dominant Hand Right     Pain: 0/10 R knee pain upon arrival, pt denies pain post PT  Lewis Shock, PT, DPT Pager #: [phone] Office #: [phone]

## 2011-12-25 DIAGNOSIS — D62 Acute posthemorrhagic anemia: Secondary | ICD-10-CM | POA: Diagnosis not present

## 2011-12-25 LAB — BASIC METABOLIC PANEL
BUN: 12 mg/dL (ref 6–23)
Calcium: 8.7 mg/dL (ref 8.4–10.5)
GFR calc Af Amer: >90 mL/min (ref >90)
GFR calc non Af Amer: 88 mL/min — ABNORMAL LOW (ref >90)
Glucose, Bld: 145 mg/dL — ABNORMAL HIGH (ref 70–99)
Potassium: 4.3 mEq/L (ref 3.5–5.1)
Sodium: 141 mEq/L (ref 135–145)

## 2011-12-25 LAB — CBC
HCT: 24.9 % — ABNORMAL LOW (ref 36.0–46.0)
MCH: 34.5 pg — ABNORMAL HIGH (ref 26.0–34.0)
MCHC: 35.3 g/dL (ref 30.0–36.0)
RDW: 13.1 % (ref 11.5–15.5)

## 2011-12-25 MED ORDER — ENOXAPARIN SODIUM 30 MG/0.3ML ~~LOC~~ SOLN: 30.0000 mg | Freq: Two times a day (BID) | SUBCUTANEOUS | Status: DC

## 2011-12-25 MED ORDER — CELECOXIB 200 MG PO CAPS: 200.0000 mg | ORAL_CAPSULE | Freq: Every day | ORAL | Status: AC

## 2011-12-25 MED ORDER — OXYCODONE HCL 5 MG PO TABS: 5.0000 mg | ORAL_TABLET | ORAL | Status: AC | PRN

## 2011-12-25 MED ORDER — PANTOPRAZOLE SODIUM 40 MG PO TBEC: 40.0000 mg | DELAYED_RELEASE_TABLET | Freq: Two times a day (BID) | ORAL | Status: DC

## 2011-12-25 MED ORDER — BISACODYL 5 MG PO TBEC: DELAYED_RELEASE_TABLET | ORAL | Status: DC

## 2011-12-25 NOTE — Progress Notes (Signed)
Physical Therapy Treatment Note   12/25/11 1046  PT Visit Information  Last PT Received On 12/25/11  Assistance Needed +1  PT Time Calculation  PT Start Time 1046  PT Stop Time 1109  PT Time Calculation (min) 23 min  Subjective Data  Subjective Pt received on commode with report of 6/10 R knee pain.  Precautions  Precautions Knee  Restrictions  RLE Weight Bearing WBAT  Cognition  Overall Cognitive Status Appears within functional limits for tasks assessed/performed  Arousal/Alertness Awake/alert  Orientation Level Appears intact for tasks assessed  Behavior During Session [facility] for tasks performed  Bed Mobility  Bed Mobility Sit to Supine  Sit to Supine 5: Supervision;HOB flat  Details for Bed Mobility Assistance pt used UEs to assist with R LE managment  Transfers  Transfers Sit to Stand;Stand to Sit  Sit to Stand 4: Min guard;With upper extremity assist;From chair/3-in-1  Stand to Sit 4: Min assist;With upper extremity assist;With armrests;To chair/3-in-1  Details for Transfer Assistance v/c's for hand placement and safety  Ambulation/Gait  Ambulation/Gait Assistance 4: Min assist  Ambulation Distance (Feet) 100 Feet  Assistive device Rolling walker  Ambulation/Gait Assistance Details v/c's for safe walker management and appropriate sequencing. pt with no episodes of R knee buckling  Gait Pattern Step-through pattern;Decreased step length - right;Decreased stance time - right;Decreased stride length  Stairs No  Exercises  Exercises Total Joint  Total Joint Exercises  Heel Slides AROM;Right;10 reps;Seated  Goniometric ROM 85 deg active in sitting  Straight Leg Raises AROM;Right;10 reps;Supine  Long Arc Quad AROM;Right;10 reps;Seated (with 3 sec hold, unable to achieve full knee ext)  PT - End of Session  Equipment Utilized During Treatment Gait belt  Activity Tolerance Patient tolerated treatment well  Patient left in bed;in CPM;with call bell/phone within reach (CPM  set 0-90 deg)  Nurse Communication Mobility status  PT - Assessment/Plan  Comments on Treatment Session Pt progressing well but still requires supervision for mobiltiy, RW for safe transfers and ambulation and still demonstrates decreased R LE strength and R knee ROM. Patient to con't to benefit from SNF to maximize functional recovery for safe transition to home alone in independent living.  PT Plan Discharge plan remains appropriate;Frequency remains appropriate  PT Frequency 7X/week  Follow Up Recommendations Skilled nursing facility  Equipment Recommended Defer to next venue  Acute Rehab PT Goals  PT Goal Formulation With patient  Time For Goal Achievement 01/07/12  Potential to Achieve Goals Good  PT Goal: Sit to Stand - Progress Progressing toward goal  PT Goal: Ambulate - Progress Progressing toward goal  PT Goal: Perform Home Exercise Program - Progress Progressing toward goal  PT General Charges  $$ ACUTE PT VISIT 1 Procedure  PT Treatments  $Gait Training 8-22 mins  $Therapeutic Exercise 8-22 mins    Pain: 6/10 R knee pain.  Lewis Shock, PT, DPT Pager #: [phone] Office #: [phone]

## 2011-12-25 NOTE — Discharge Summary (Signed)
Patient ID: Kristin Solomon MRN: [id number] DOB/AGE: Nov 23, 1929 78 y.o.  Admit date: 12/23/2011 Discharge date: 12/26/11  Admission Diagnoses:  Principal Problem:  *Right knee DJD Active Problems:  Hypertension  Tachycardia  Anemia, iron deficiency  Status post total knee replacement  Lumbar radiculopathy, chronic  Postoperative anemia due to acute blood loss   Discharge Diagnoses:  Same  Past Medical History  Diagnosis Date  . Hyperlipidemia   . Tachycardia   . Herpes ocular   . Right knee DJD   . Status post total knee replacement 2005    left total knee  . Complication of anesthesia     told she has a small airway  . Hypertension     takes metoprolol twice/day  . Depression     takes effexor daily  . GERD (gastroesophageal reflux disease)     takes prilosec daily  . Anemia, iron deficiency     hx of blood transfusion    Surgeries: Procedure(s): TOTAL KNEE ARTHROPLASTY on 12/23/2011   Consultants:    Discharged Condition: Improved  Hospital Course: Kristin Solomon is an 78 y.o. female who was admitted 12/23/2011 for operative treatment ofRight knee DJD. Patient has severe unremitting pain that affects sleep, daily activities, and work/hobbies. After pre-op clearance the patient was taken to the operating room on 12/23/2011 and underwent  Procedure(s): TOTAL KNEE ARTHROPLASTY.    Patient was given perioperative antibiotics: Anti-infectives     Start     Dose/Rate Route Frequency Ordered Stop   12/23/11 1700   valACYclovir (VALTREX) tablet 1,000 mg        1,000 mg Oral Daily 12/23/11 1424     12/23/11 1700   ceFAZolin (ANCEF) IVPB 1 g/50 mL premix        1 g 100 mL/hr over 30 Minutes Intravenous Every 6 hours 12/23/11 1424 12/23/11 2313   12/23/11 1025   cefUROXime (ZINACEF) injection  Status:  Discontinued          As needed 12/23/11 1025 12/23/11 1108   12/22/11 1541   ceFAZolin (ANCEF) IVPB 1 g/50 mL premix        1 g 100 mL/hr over 30 Minutes  Intravenous 60 min pre-op 12/22/11 1541 12/23/11 0930           Patient was given sequential compression devices, early ambulation, and chemoprophylaxis to prevent DVT. Patients hemoglobin was 7.5 on 12/26/2011  She was given 2 units of packed red blood cells with 20 mg lasix between units.  She was stable following transfusion and discharged to skilled nursing  Patient benefited maximally from hospital stay and there were no complications.    Recent vital signs:  Patient Vitals for the past 24 hrs:  BP Temp Temp src Pulse Resp SpO2  12/26/11 0900 128/42 mmHg 98.8 F (37.1 C) Oral 77  18  -  12/26/11 0826 137/44 mmHg 98.3 F (36.8 C) Oral 91  16  -  12/26/11 0643 137/61 mmHg 97.7 F (36.5 C) - 71  16  95 %  12/25/11 1945 155/72 mmHg 97.9 F (36.6 C) - 81  18  98 %  12/25/11 1337 124/47 mmHg 98.9 F (37.2 C) - 81  18  97 %     Recent laboratory studies:   Basename 12/26/11 0500 12/25/11 0525  WBC 9.1 11.0*  HGB 7.5* 8.8*  HCT 21.9* 24.9*  PLT 177 178  NA 140 141  K 4.0 4.3  CL 105 106  CO2 26 26  BUN  16 12  CREATININE 0.52 0.52  GLUCOSE 107* 145*  INR -- --  CALCIUM 8.4 --     Discharge Medications:   Medication List  As of 12/26/2011  9:43 AM   STOP taking these medications         aspirin EC 81 MG tablet      omeprazole 20 MG tablet         TAKE these medications         bisacodyl 5 MG EC tablet   Commonly known as: DULCOLAX   2 tabs before supper as needed for constipation      celecoxib 200 MG capsule   Commonly known as: CELEBREX   Take 1 capsule (200 mg total) by mouth daily.      cholecalciferol 1000 UNITS tablet   Commonly known as: VITAMIN D   Take 2,000 Units by mouth daily.      COMBIGAN 0.2-0.5 % ophthalmic solution   Generic drug: brimonidine-timolol   Place 1 drop into the left eye 2 (two) times daily.      enoxaparin 30 MG/0.3ML injection   Commonly known as: LOVENOX   Inject 0.3 mLs (30 mg total) into the skin every 12 (twelve)  hours.      Iron 240 (27 FE) MG Tabs   Take 1 tablet by mouth daily.      metoprolol 50 MG tablet   Commonly known as: LOPRESSOR   Take 25-50 mg by mouth 2 (two) times daily. Take 50 MG in the morning and take 25 MG at bedtime.      oxyCODONE 5 MG immediate release tablet   Commonly known as: Oxy IR/ROXICODONE   Take 1-2 tablets (5-10 mg total) by mouth every 4 (four) hours as needed.      pantoprazole 40 MG tablet   Commonly known as: PROTONIX   Take 1 tablet (40 mg total) by mouth 2 (two) times daily before a meal.      prednisoLONE acetate 1 % ophthalmic suspension   Commonly known as: PRED FORTE   Place 1 drop into the left eye daily.      rosuvastatin 10 MG tablet   Commonly known as: CRESTOR   Take 10 mg by mouth daily.      senna 8.6 MG Tabs   Commonly known as: SENOKOT   Take 1 tablet by mouth.      STOOL SOFTENER PO   Take 1 tablet by mouth daily.      valACYclovir 500 MG tablet   Commonly known as: VALTREX   Take 1,000 mg by mouth daily.      venlafaxine XR 75 MG 24 hr capsule   Commonly known as: EFFEXOR-XR   Take 75 mg by mouth at bedtime.            Diagnostic Studies: Dg Chest 2 View  12/17/2011  *RADIOLOGY REPORT*  Clinical Data: Preoperative assessment for total knee arthroplasty, history tachycardia, GERD, hypertension  CHEST - 2 VIEW  Comparison: 06/27/2011  Findings: Upper-normal size of cardiac silhouette. Tortuous aorta. No pulmonary vascularity normal. Minimal atelectasis at left base. Lungs slightly hyperinflated but clear. No pleural effusion or pneumothorax. No acute osseous findings.  IMPRESSION: Minimal hyperaeration and left basilar atelectasis.  Original Report Authenticated By: Lollie Marrow, M.D.    Disposition: 03-Skilled Nursing Facility  Discharge Orders    Future Orders Please Complete By Expires   Diet - low sodium heart healthy      Call MD /  Call 911      Comments:   If you experience chest pain or shortness of breath, CALL  911 and be transported to the hospital emergency room.  If you develope a fever above 101 F, pus (white drainage) or increased drainage or redness at the wound, or calf pain, call your surgeon's office.   Constipation Prevention      Comments:   Drink plenty of fluids.  Prune juice may be helpful.  You may use a stool softener, such as Colace (over the counter) 100 mg twice a day.  Use MiraLax (over the counter) for constipation as needed.   Increase activity slowly as tolerated      Discharge instructions      Comments:   Total Knee Replacement Care After Refer to this sheet in the next few weeks. These discharge instructions provide you with general information on caring for yourself after you leave the hospital. Your caregiver may also give you specific instructions. Your treatment has been planned according to the most current medical practices available, but unavoidable complications sometimes occur. If you have any problems or questions after discharge, please call your caregiver. Regaining a near full range of motion of your knee within the first 3 to 6 weeks after surgery is critical. HOME CARE INSTRUCTIONS  You may resume a normal diet and activities as directed.  Perform exercises as directed.  Place yellow foam block, yellow side up under heel at all times except when in CPM or when walking.  DO NOT modify, tear, cut, or change in any way. You will receive physical therapy daily  Take showers instead of baths until informed otherwise.  Change bandages (dressings)daily Do not take over-the-counter or prescription medicines for pain, discomfort, or fever. Eat a well-balanced diet.  Avoid lifting or driving until you are instructed otherwise.  Make an appointment to see your caregiver for stitches (suture) or staple removal as directed.  If you have been sent home with a continuous passive motion machine (CPM machine), 0-90 degrees 6 hrs a day   2 hrs a shift SEEK MEDICAL CARE IF: You  have swelling of your calf or leg.  You develop shortness of breath or chest pain.  You have redness, swelling, or increasing pain in the wound.  There is pus or any unusual drainage coming from the surgical site.  You notice a bad smell coming from the surgical site or dressing.  The surgical site breaks open after sutures or staples have been removed.  There is persistent bleeding from the suture or staple line.  You are getting worse or are not improving.  You have any other questions or concerns.  SEEK IMMEDIATE MEDICAL CARE IF:  You have a fever.  You develop a rash.  You have difficulty breathing.  You develop any reaction or side effects to medicines given.  Your knee motion is decreasing rather than improving.  MAKE SURE YOU:  Understand these instructions.  Will watch your condition.  Will get help right away if you are not doing well or get worse.   CPM      Comments:   Continuous passive motion machine (CPM):      Use the CPM from 0 to 90 for 6 hours per day.       You may break it up into 2 or 3 sessions per day.      Use CPM for 2 weeks or until you are told to stop.   TED hose  Comments:   Use stockings (TED hose) for 2 weeks on both leg(s).  You may remove them at night for sleeping.   Change dressing      Comments:   Change the dressing daily with sterile 4 x 4 inch gauze dressing and apply TED hose.  You may clean the incision with alcohol prior to redressing.   Do not put a pillow under the knee. Place it under the heel.      Comments:   Place yellow foam block, yellow side up under heel at all times except when in CPM or when walking.  DO NOT modify, tear, cut, or change in any way the yellow foam block.      Follow-up Information    Follow up with Nilda Simmer, MD on 01/07/2012. (appt time 10:15)    Contact information:   [facility] [address] [place] [address] [phone]            Signed: Pascal Lux 12/26/2011, 9:43 AM

## 2011-12-26 DIAGNOSIS — M1710 Unilateral primary osteoarthritis, unspecified knee: Secondary | ICD-10-CM | POA: Diagnosis not present

## 2011-12-26 DIAGNOSIS — K5900 Constipation, unspecified: Secondary | ICD-10-CM | POA: Diagnosis not present

## 2011-12-26 DIAGNOSIS — R1312 Dysphagia, oropharyngeal phase: Secondary | ICD-10-CM | POA: Diagnosis not present

## 2011-12-26 DIAGNOSIS — B009 Herpesviral infection, unspecified: Secondary | ICD-10-CM | POA: Diagnosis not present

## 2011-12-26 DIAGNOSIS — M6281 Muscle weakness (generalized): Secondary | ICD-10-CM | POA: Diagnosis not present

## 2011-12-26 DIAGNOSIS — R269 Unspecified abnormalities of gait and mobility: Secondary | ICD-10-CM | POA: Diagnosis not present

## 2011-12-26 DIAGNOSIS — M064 Inflammatory polyarthropathy: Secondary | ICD-10-CM | POA: Diagnosis not present

## 2011-12-26 DIAGNOSIS — Z96659 Presence of unspecified artificial knee joint: Secondary | ICD-10-CM | POA: Diagnosis not present

## 2011-12-26 DIAGNOSIS — M48061 Spinal stenosis, lumbar region without neurogenic claudication: Secondary | ICD-10-CM | POA: Diagnosis not present

## 2011-12-26 DIAGNOSIS — M25569 Pain in unspecified knee: Secondary | ICD-10-CM | POA: Diagnosis not present

## 2011-12-26 DIAGNOSIS — D649 Anemia, unspecified: Secondary | ICD-10-CM | POA: Diagnosis not present

## 2011-12-26 DIAGNOSIS — R279 Unspecified lack of coordination: Secondary | ICD-10-CM | POA: Diagnosis not present

## 2011-12-26 DIAGNOSIS — Z471 Aftercare following joint replacement surgery: Secondary | ICD-10-CM | POA: Diagnosis not present

## 2011-12-26 DIAGNOSIS — R000 Tachycardia, unspecified: Secondary | ICD-10-CM | POA: Diagnosis not present

## 2011-12-26 DIAGNOSIS — G548 Other nerve root and plexus disorders: Secondary | ICD-10-CM | POA: Diagnosis not present

## 2011-12-26 DIAGNOSIS — Z4889 Encounter for other specified surgical aftercare: Secondary | ICD-10-CM | POA: Diagnosis not present

## 2011-12-26 DIAGNOSIS — E785 Hyperlipidemia, unspecified: Secondary | ICD-10-CM | POA: Diagnosis not present

## 2011-12-26 DIAGNOSIS — I10 Essential (primary) hypertension: Secondary | ICD-10-CM | POA: Diagnosis not present

## 2011-12-26 DIAGNOSIS — S8990XA Unspecified injury of unspecified lower leg, initial encounter: Secondary | ICD-10-CM | POA: Diagnosis not present

## 2011-12-26 LAB — CBC
MCHC: 34.2 g/dL (ref 30.0–36.0)
MCV: 96.5 fL (ref 78.0–100.0)
Platelets: 177 10*3/uL (ref 150–400)
RDW: 13 % (ref 11.5–15.5)
WBC: 9.1 10*3/uL (ref 4.0–10.5)

## 2011-12-26 LAB — PREPARE RBC (CROSSMATCH)

## 2011-12-26 LAB — BASIC METABOLIC PANEL
Chloride: 105 mEq/L (ref 96–112)
Creatinine, Ser: 0.52 mg/dL (ref 0.50–1.10)
GFR calc Af Amer: >90 mL/min (ref >90)
GFR calc non Af Amer: 88 mL/min — ABNORMAL LOW (ref >90)
Potassium: 4 mEq/L (ref 3.5–5.1)

## 2011-12-26 MED ORDER — FUROSEMIDE 10 MG/ML IJ SOLN
20.0000 mg | Freq: Once | INTRAMUSCULAR | Status: AC
  Administered 2011-12-26: 20 mg via INTRAVENOUS
  Filled 2011-12-26: qty 2

## 2011-12-26 MED ORDER — SENNA 8.6 MG PO TABS
2.0000 | ORAL_TABLET | Freq: Every day | ORAL | Status: DC | PRN
  Administered 2011-12-26: 17.2 mg via ORAL
  Filled 2011-12-26 (×2): qty 2

## 2011-12-26 NOTE — Progress Notes (Signed)
Report called to the RN at [facility]. Arnoldo Morale RN

## 2011-12-26 NOTE — Progress Notes (Signed)
PT Progress Note:    12/26/11 1300  PT Visit Information  Last PT Received On 12/26/11  Assistance Needed +1  PT Time Calculation  PT Start Time 0820  PT Stop Time 0847  PT Time Calculation (min) 27 min  Precautions  Precautions Knee  Required Braces or Orthoses Knee Immobilizer - Right  Restrictions  RLE Weight Bearing WBAT  Cognition  Overall Cognitive Status Appears within functional limits for tasks assessed/performed  Arousal/Alertness Awake/alert  Orientation Level Appears intact for tasks assessed  Behavior During Session [facility] for tasks performed  Bed Mobility  Bed Mobility Sit to Supine  Sit to Supine 6: Modified independent (Device/Increase time);HOB flat  Transfers  Transfers Sit to Stand;Stand to Sit  Sit to Stand 5: Supervision;With upper extremity assist;With armrests;From chair/3-in-1  Stand to Sit 5: Supervision;With upper extremity assist;To bed  Details for Transfer Assistance Minor cueing for safe hand placement & body positioning before sititng  Ambulation/Gait  Ambulation/Gait Assistance 4: Min guard  Ambulation Distance (Feet) 120 Feet  Assistive device Rolling walker  Ambulation/Gait Assistance Details Cues to stay inside RW, tall posture, & safety with turns.   Gait Pattern Step-through pattern  Stairs No  Wheelchair Mobility  Wheelchair Mobility No  Balance  Balance Assessed No  PT - End of Session  Equipment Utilized During Treatment Gait belt  Activity Tolerance Patient tolerated treatment well  Patient left in bed;in CPM;with call bell/phone within reach;with nursing in room  PT - Assessment/Plan  Comments on Treatment Session Pt tolerated tx well despite low Hgb.    PT Plan Discharge plan remains appropriate;Frequency remains appropriate  PT Frequency 7X/week  Follow Up Recommendations Skilled nursing facility  Equipment Recommended Defer to next venue  Acute Rehab PT Goals  Time For Goal Achievement 01/07/12  PT Goal: Sit to Stand -  Progress Progressing toward goal  PT Goal: Ambulate - Progress Progressing toward goal  PT General Charges  $$ ACUTE PT VISIT 1 Procedure  PT Treatments  $Gait Training 8-22 mins  $Therapeutic Activity 8-22 mins     Verdell Face, [place] [phone] 12/26/2011

## 2011-12-26 NOTE — Progress Notes (Signed)
Subjective: 3 Days Post-Op Procedure(s) (LRB): TOTAL KNEE ARTHROPLASTY (Right) Patient reports pain as mild.    Objective: Vital signs in last 24 hours: Temp:  [97.7 F (36.5 C)-98.9 F (37.2 C)] 98.8 F (37.1 C) (07/04 0900) Pulse Rate:  [71-91] 77  (07/04 0900) Resp:  [16-18] 18  (07/04 0900) BP: (124-155)/(42-72) 128/42 mmHg (07/04 0900) SpO2:  [95 %-98 %] 95 % (07/04 0643)  Intake/Output from previous day: 07/03 0701 - 07/04 0700 In: 720 [P.O.:720] Out: -  Intake/Output this shift: Total I/O In: 255 [P.O.:240; Blood:15] Out: -    Basename 12/26/11 0500 12/25/11 0525 12/24/11 0415  HGB 7.5* 8.8* 9.4*    Basename 12/26/11 0500 12/25/11 0525  WBC 9.1 11.0*  RBC 2.27* 2.55*  HCT 21.9* 24.9*  PLT 177 178    Basename 12/26/11 0500 12/25/11 0525  NA 140 141  K 4.0 4.3  CL 105 106  CO2 26 26  BUN 16 12  CREATININE 0.52 0.52  GLUCOSE 107* 145*  CALCIUM 8.4 8.7   No results found for this basename: LABPT:2,INR:2 in the last 72 hours  ABD soft Neurovascular intact Sensation intact distally Intact pulses distally Dorsiflexion/Plantar flexion intact Incision: dressing C/D/I No cellulitis present  Assessment/Plan: 3 Days Post-Op Procedure(s) (LRB): TOTAL KNEE ARTHROPLASTY (Right) Discharge to SNF 2 units prbc, may d/c to snf after transfusion Dressing changed Senokot ordered and may give fleet enema if still c/o constipation  Kristin Solomon 12/26/2011, 10:05 AM

## 2011-12-27 LAB — TYPE AND SCREEN
ABO/RH(D): A POS
Antibody Screen: NEGATIVE
Unit division: 0

## 2011-12-30 DIAGNOSIS — M25569 Pain in unspecified knee: Secondary | ICD-10-CM | POA: Diagnosis not present

## 2011-12-30 DIAGNOSIS — E785 Hyperlipidemia, unspecified: Secondary | ICD-10-CM | POA: Diagnosis not present

## 2011-12-30 DIAGNOSIS — B009 Herpesviral infection, unspecified: Secondary | ICD-10-CM | POA: Diagnosis not present

## 2011-12-30 DIAGNOSIS — Z4889 Encounter for other specified surgical aftercare: Secondary | ICD-10-CM | POA: Diagnosis not present

## 2011-12-30 DIAGNOSIS — D649 Anemia, unspecified: Secondary | ICD-10-CM | POA: Diagnosis not present

## 2012-01-07 DIAGNOSIS — M1710 Unilateral primary osteoarthritis, unspecified knee: Secondary | ICD-10-CM | POA: Diagnosis not present

## 2012-01-08 DIAGNOSIS — M48061 Spinal stenosis, lumbar region without neurogenic claudication: Secondary | ICD-10-CM | POA: Diagnosis not present

## 2012-01-08 DIAGNOSIS — M25569 Pain in unspecified knee: Secondary | ICD-10-CM | POA: Diagnosis not present

## 2012-01-08 DIAGNOSIS — D649 Anemia, unspecified: Secondary | ICD-10-CM | POA: Diagnosis not present

## 2012-01-08 DIAGNOSIS — I10 Essential (primary) hypertension: Secondary | ICD-10-CM | POA: Diagnosis not present

## 2012-01-08 DIAGNOSIS — K5900 Constipation, unspecified: Secondary | ICD-10-CM | POA: Diagnosis not present

## 2012-01-23 DIAGNOSIS — R262 Difficulty in walking, not elsewhere classified: Secondary | ICD-10-CM | POA: Diagnosis not present

## 2012-01-23 DIAGNOSIS — R279 Unspecified lack of coordination: Secondary | ICD-10-CM | POA: Diagnosis not present

## 2012-01-24 DIAGNOSIS — R262 Difficulty in walking, not elsewhere classified: Secondary | ICD-10-CM | POA: Diagnosis not present

## 2012-01-24 DIAGNOSIS — R279 Unspecified lack of coordination: Secondary | ICD-10-CM | POA: Diagnosis not present

## 2012-01-27 DIAGNOSIS — R262 Difficulty in walking, not elsewhere classified: Secondary | ICD-10-CM | POA: Diagnosis not present

## 2012-01-27 DIAGNOSIS — R279 Unspecified lack of coordination: Secondary | ICD-10-CM | POA: Diagnosis not present

## 2012-01-28 DIAGNOSIS — R262 Difficulty in walking, not elsewhere classified: Secondary | ICD-10-CM | POA: Diagnosis not present

## 2012-01-28 DIAGNOSIS — R279 Unspecified lack of coordination: Secondary | ICD-10-CM | POA: Diagnosis not present

## 2012-01-29 DIAGNOSIS — R262 Difficulty in walking, not elsewhere classified: Secondary | ICD-10-CM | POA: Diagnosis not present

## 2012-01-29 DIAGNOSIS — R279 Unspecified lack of coordination: Secondary | ICD-10-CM | POA: Diagnosis not present

## 2012-01-30 DIAGNOSIS — R279 Unspecified lack of coordination: Secondary | ICD-10-CM | POA: Diagnosis not present

## 2012-01-30 DIAGNOSIS — R262 Difficulty in walking, not elsewhere classified: Secondary | ICD-10-CM | POA: Diagnosis not present

## 2012-01-31 DIAGNOSIS — R262 Difficulty in walking, not elsewhere classified: Secondary | ICD-10-CM | POA: Diagnosis not present

## 2012-01-31 DIAGNOSIS — R279 Unspecified lack of coordination: Secondary | ICD-10-CM | POA: Diagnosis not present

## 2012-02-03 DIAGNOSIS — R262 Difficulty in walking, not elsewhere classified: Secondary | ICD-10-CM | POA: Diagnosis not present

## 2012-02-03 DIAGNOSIS — R279 Unspecified lack of coordination: Secondary | ICD-10-CM | POA: Diagnosis not present

## 2012-02-05 DIAGNOSIS — R279 Unspecified lack of coordination: Secondary | ICD-10-CM | POA: Diagnosis not present

## 2012-02-05 DIAGNOSIS — R262 Difficulty in walking, not elsewhere classified: Secondary | ICD-10-CM | POA: Diagnosis not present

## 2012-02-06 ENCOUNTER — Ambulatory Visit (INDEPENDENT_AMBULATORY_CARE_PROVIDER_SITE_OTHER): Payer: Medicare Other | Admitting: Family Medicine

## 2012-02-06 VITALS — BP 113/65 | HR 66 | Temp 98.3°F | Resp 18 | Ht 62.5 in | Wt 137.0 lb

## 2012-02-06 DIAGNOSIS — M62830 Muscle spasm of back: Secondary | ICD-10-CM

## 2012-02-06 DIAGNOSIS — R262 Difficulty in walking, not elsewhere classified: Secondary | ICD-10-CM | POA: Diagnosis not present

## 2012-02-06 DIAGNOSIS — R279 Unspecified lack of coordination: Secondary | ICD-10-CM | POA: Diagnosis not present

## 2012-02-06 DIAGNOSIS — M5380 Other specified dorsopathies, site unspecified: Secondary | ICD-10-CM | POA: Diagnosis not present

## 2012-02-06 MED ORDER — LIDOCAINE 5 % EX PTCH: 1.0000 | MEDICATED_PATCH | Freq: Two times a day (BID) | CUTANEOUS | Status: DC

## 2012-02-06 MED ORDER — LIDOCAINE 5 % EX PTCH: 1.0000 | MEDICATED_PATCH | Freq: Two times a day (BID) | CUTANEOUS | Status: AC

## 2012-02-06 NOTE — Progress Notes (Signed)
[facility]:  Office Visit  Chief Complaint:  Chief Complaint  Patient presents with  . Spasms    lower back spasms    HPI: Kristin Solomon is a 78 y.o. female who complains of  Back pain after started PT exercises for knee surgery. Throbbing, sharp pain localized to paraspinal msk of thoracic area. Is on Celebrex and Hydrocodone. Worse with movement  Past Medical History  Diagnosis Date  . Hyperlipidemia   . Tachycardia   . Herpes ocular   . Right knee DJD   . Status post total knee replacement 2005    left total knee  . Complication of anesthesia     told she has a small airway  . Hypertension     takes metoprolol twice/day  . Depression     takes effexor daily  . GERD (gastroesophageal reflux disease)     takes prilosec daily  . Anemia, iron deficiency     hx of blood transfusion   Past Surgical History  Procedure Date  . Back surgery   . Rotator cuff repair w/ distal clavicle excision   . Joint replacement 2005    left total knee  . Abdominal hysterectomy 1995  . Hiatal hernia repair 2002  . Eye surgery 2012    corneal transplant - left  . Tonsillectomy     as child  . Colonoscopy   . Total knee arthroplasty 12/23/2011    Procedure: TOTAL KNEE ARTHROPLASTY;  Surgeon: Nilda Simmer, MD;  Location: [facility] OR;  Service: Orthopedics;  Laterality: Right;  DR Anne Ng 90 MINUTES FOR THIS CASE   History   Social History  . Marital Status: Married    Spouse Name: N/A    Number of Children: N/A  . Years of Education: N/A   Social History Main Topics  . Smoking status: Former Games developer  . Smokeless tobacco: None  . Alcohol Use: No  . Drug Use: No  . Sexually Active: Not Currently   Other Topics Concern  . None   Social History Narrative  . None   Family History  Problem Relation Age of Onset  . Heart attack Mother   . Parkinsonism Father   . Diabetes Brother    Allergies  Allergen Reactions  . Morphine And Related Other (See  Comments)    hallucinations   Prior to Admission medications   Medication Sig Start Date End Date Taking? Authorizing Provider  bisacodyl (DULCOLAX) 5 MG EC tablet 2 tabs before supper as needed for constipation 12/25/11  Yes [name], PA  brimonidine-timolol (COMBIGAN) 0.2-0.5 % ophthalmic solution Place 1 drop into the left eye 2 (two) times daily.   Yes Historical Provider, MD  celecoxib (CELEBREX) 200 MG capsule Take 200 mg by mouth 2 (two) times daily.   Yes Historical Provider, MD  cholecalciferol (VITAMIN D) 1000 UNITS tablet Take 2,000 Units by mouth daily.   Yes Historical Provider, MD  Docusate Calcium (STOOL SOFTENER PO) Take 1 tablet by mouth daily.   Yes Historical Provider, MD  Ferrous Gluconate (IRON) 240 (27 FE) MG TABS Take 1 tablet by mouth daily.   Yes Historical Provider, MD  metoprolol (LOPRESSOR) 50 MG tablet Take 25-50 mg by mouth 2 (two) times daily. Take 50 MG in the morning and take 25 MG at bedtime.   Yes Historical Provider, MD  omeprazole (PRILOSEC) 40 MG capsule Take 40 mg by mouth daily.   Yes Historical Provider, MD  prednisoLONE acetate (  PRED FORTE) 1 % ophthalmic suspension Place 1 drop into the left eye daily.   Yes Historical Provider, MD  rosuvastatin (CRESTOR) 10 MG tablet Take 10 mg by mouth daily.   Yes Historical Provider, MD  senna (SENOKOT) 8.6 MG TABS Take 1 tablet by mouth.   Yes Historical Provider, MD  valACYclovir (VALTREX) 500 MG tablet Take 1,000 mg by mouth daily.   Yes Historical Provider, MD  venlafaxine XR (EFFEXOR-XR) 75 MG 24 hr capsule Take 75 mg by mouth at bedtime.   Yes Historical Provider, MD  enoxaparin (LOVENOX) 30 MG/0.3ML injection Inject 0.3 mLs (30 mg total) into the skin every 12 (twelve) hours. 12/25/11   [name], PA  pantoprazole (PROTONIX) 40 MG tablet Take 1 tablet (40 mg total) by mouth 2 (two) times daily before a meal. 12/25/11 12/24/12  [name], PA     ROS: The patient denies fevers,  chills, night sweats, unintentional weight loss, chest pain, palpitations, wheezing, dyspnea on exertion, nausea, vomiting, abdominal pain, dysuria, hematuria, melena, numbness, weakness, or tingling.  All other systems have been reviewed and were otherwise negative with the exception of those mentioned in the HPI and as above.    PHYSICAL EXAM: Filed Vitals:   02/06/12 1759  BP: 113/65  Pulse: 66  Temp: 98.3 F (36.8 C)  Resp: 18   Filed Vitals:   02/06/12 1759  Height: 5' 2.5" (1.588 m)  Weight: 137 lb (62.143 kg)   Body mass index is 24.66 kg/(m^2).  General: Alert, no acute distress HEENT:  Normocephalic, atraumatic, oropharynx patent.  Cardiovascular:  Regular rate and rhythm, no rubs murmurs or gallops.  No Carotid bruits, radial pulse intact. No pedal edema.  Respiratory: Clear to auscultation bilaterally.  No wheezes, rales, or rhonchi.  No cyanosis, no use of accessory musculature GI: No organomegaly, abdomen is soft and non-tender, positive bowel sounds.  No masses. Skin: No rashes. Neurologic: Facial musculature symmetric. Psychiatric: Patient is appropriate throughout our interaction. Lymphatic: No cervical lymphadenopathy Musculoskeletal: Gait intact. + tenderness on palpation and pain  with ROM of bilateral shoulder along paraspinal msk of thoracic spine. ROM intact. Strength intact   LABS: Results for orders placed during the hospital encounter of 12/23/11  CBC      Component Value Range   WBC 10.5  4.0 - 10.5 K/uL   RBC 2.86 (*) 3.87 - 5.11 MIL/uL   Hemoglobin 9.4 (*) 12.0 - 15.0 g/dL   HCT 16.1 (*) 09.6 - 04.5 %   MCV 96.2  78.0 - 100.0 fL   MCH 32.9  26.0 - 34.0 pg   MCHC 34.2  30.0 - 36.0 g/dL   RDW 40.9  81.1 - 91.4 %   Platelets 209  150 - 400 K/uL  BASIC METABOLIC PANEL      Component Value Range   Sodium 135  135 - 145 mEq/L   Potassium 4.2  3.5 - 5.1 mEq/L   Chloride 104  96 - 112 mEq/L   CO2 25  19 - 32 mEq/L   Glucose, Bld 157 (*) 70 - 99  mg/dL   BUN 9  6 - 23 mg/dL   Creatinine, Ser 7.82  0.50 - 1.10 mg/dL   Calcium 8.2 (*) 8.4 - 10.5 mg/dL   GFR calc non Af Amer 87 (*) >90 mL/min   GFR calc Af Amer >90  >90 mL/min  CBC      Component Value Range   WBC 11.0 (*) 4.0 -  10.5 K/uL   RBC 2.55 (*) 3.87 - 5.11 MIL/uL   Hemoglobin 8.8 (*) 12.0 - 15.0 g/dL   HCT 40.9 (*) 81.1 - 91.4 %   MCV 97.6  78.0 - 100.0 fL   MCH 34.5 (*) 26.0 - 34.0 pg   MCHC 35.3  30.0 - 36.0 g/dL   RDW 78.2  95.6 - 21.3 %   Platelets 178  150 - 400 K/uL  BASIC METABOLIC PANEL      Component Value Range   Sodium 141  135 - 145 mEq/L   Potassium 4.3  3.5 - 5.1 mEq/L   Chloride 106  96 - 112 mEq/L   CO2 26  19 - 32 mEq/L   Glucose, Bld 145 (*) 70 - 99 mg/dL   BUN 12  6 - 23 mg/dL   Creatinine, Ser 0.86  0.50 - 1.10 mg/dL   Calcium 8.7  8.4 - 57.8 mg/dL   GFR calc non Af Amer 88 (*) >90 mL/min   GFR calc Af Amer >90  >90 mL/min  CBC      Component Value Range   WBC 9.1  4.0 - 10.5 K/uL   RBC 2.27 (*) 3.87 - 5.11 MIL/uL   Hemoglobin 7.5 (*) 12.0 - 15.0 g/dL   HCT 46.9 (*) 62.9 - 52.8 %   MCV 96.5  78.0 - 100.0 fL   MCH 33.0  26.0 - 34.0 pg   MCHC 34.2  30.0 - 36.0 g/dL   RDW 41.3  24.4 - 01.0 %   Platelets 177  150 - 400 K/uL  BASIC METABOLIC PANEL      Component Value Range   Sodium 140  135 - 145 mEq/L   Potassium 4.0  3.5 - 5.1 mEq/L   Chloride 105  96 - 112 mEq/L   CO2 26  19 - 32 mEq/L   Glucose, Bld 107 (*) 70 - 99 mg/dL   BUN 16  6 - 23 mg/dL   Creatinine, Ser 2.72  0.50 - 1.10 mg/dL   Calcium 8.4  8.4 - 53.6 mg/dL   GFR calc non Af Amer 88 (*) >90 mL/min   GFR calc Af Amer >90  >90 mL/min  PREPARE RBC (CROSSMATCH)      Component Value Range   Order Confirmation ORDER PROCESSED BY BLOOD BANK       EKG/XRAY:   Primary read interpreted by Dr. Conley Rolls at [facility].   ASSESSMENT/PLAN: Encounter Diagnosis  Name Primary?  . Back spasm Yes   Declined msk relaxer Rx Lidoderm patch    Avelino Herren PHUONG, DO 02/06/2012 7:10  PM

## 2012-02-07 DIAGNOSIS — F4320 Adjustment disorder, unspecified: Secondary | ICD-10-CM | POA: Diagnosis not present

## 2012-02-07 DIAGNOSIS — M545 Low back pain: Secondary | ICD-10-CM | POA: Diagnosis not present

## 2012-02-10 ENCOUNTER — Telehealth: Payer: Self-pay

## 2012-02-10 DIAGNOSIS — R262 Difficulty in walking, not elsewhere classified: Secondary | ICD-10-CM | POA: Diagnosis not present

## 2012-02-10 DIAGNOSIS — R279 Unspecified lack of coordination: Secondary | ICD-10-CM | POA: Diagnosis not present

## 2012-02-10 NOTE — Telephone Encounter (Signed)
Received prior auth req for pts' Lidoderm patch. Called [employer] and then faxed in completed form. Lidoderm was denied for back spasms (the only Dx I have seen covered for this med is post-herpatic neuralgia). I have put form and denial in Dr Irwin Brakeman box and will forward this message to her to see if she wishes to appeal the decision or Rx something else for pt.

## 2012-02-11 DIAGNOSIS — M545 Low back pain, unspecified: Secondary | ICD-10-CM | POA: Diagnosis not present

## 2012-02-12 DIAGNOSIS — R279 Unspecified lack of coordination: Secondary | ICD-10-CM | POA: Diagnosis not present

## 2012-02-12 DIAGNOSIS — R262 Difficulty in walking, not elsewhere classified: Secondary | ICD-10-CM | POA: Diagnosis not present

## 2012-02-14 DIAGNOSIS — R262 Difficulty in walking, not elsewhere classified: Secondary | ICD-10-CM | POA: Diagnosis not present

## 2012-02-14 DIAGNOSIS — R279 Unspecified lack of coordination: Secondary | ICD-10-CM | POA: Diagnosis not present

## 2012-02-17 DIAGNOSIS — M545 Low back pain, unspecified: Secondary | ICD-10-CM | POA: Diagnosis not present

## 2012-02-17 DIAGNOSIS — M546 Pain in thoracic spine: Secondary | ICD-10-CM | POA: Diagnosis not present

## 2012-02-19 DIAGNOSIS — R262 Difficulty in walking, not elsewhere classified: Secondary | ICD-10-CM | POA: Diagnosis not present

## 2012-02-19 DIAGNOSIS — R279 Unspecified lack of coordination: Secondary | ICD-10-CM | POA: Diagnosis not present

## 2012-02-21 DIAGNOSIS — R279 Unspecified lack of coordination: Secondary | ICD-10-CM | POA: Diagnosis not present

## 2012-02-21 DIAGNOSIS — R262 Difficulty in walking, not elsewhere classified: Secondary | ICD-10-CM | POA: Diagnosis not present

## 2012-02-24 DIAGNOSIS — R269 Unspecified abnormalities of gait and mobility: Secondary | ICD-10-CM | POA: Diagnosis not present

## 2012-02-24 DIAGNOSIS — R262 Difficulty in walking, not elsewhere classified: Secondary | ICD-10-CM | POA: Diagnosis not present

## 2012-02-24 DIAGNOSIS — M6281 Muscle weakness (generalized): Secondary | ICD-10-CM | POA: Diagnosis not present

## 2012-02-26 DIAGNOSIS — B0052 Herpesviral keratitis: Secondary | ICD-10-CM | POA: Diagnosis not present

## 2012-02-26 NOTE — Telephone Encounter (Signed)
Received a VM from [facility] stating that they have received our appeal and it has gone to the Med Director for review. We will be notified of decision.

## 2012-02-28 DIAGNOSIS — R269 Unspecified abnormalities of gait and mobility: Secondary | ICD-10-CM | POA: Diagnosis not present

## 2012-02-28 DIAGNOSIS — M6281 Muscle weakness (generalized): Secondary | ICD-10-CM | POA: Diagnosis not present

## 2012-02-28 DIAGNOSIS — R262 Difficulty in walking, not elsewhere classified: Secondary | ICD-10-CM | POA: Diagnosis not present

## 2012-03-02 DIAGNOSIS — R269 Unspecified abnormalities of gait and mobility: Secondary | ICD-10-CM | POA: Diagnosis not present

## 2012-03-02 DIAGNOSIS — R262 Difficulty in walking, not elsewhere classified: Secondary | ICD-10-CM | POA: Diagnosis not present

## 2012-03-02 DIAGNOSIS — M6281 Muscle weakness (generalized): Secondary | ICD-10-CM | POA: Diagnosis not present

## 2012-03-03 NOTE — Telephone Encounter (Signed)
Called to check status of appeal and was told they do not have any updates on status since they received it. Was told that it could take up to 60 days for decision. I read representative from [facility] letter that they will have decision w/in 7 days. I will check back on 7th day if we do not receive notification.

## 2012-03-05 NOTE — Telephone Encounter (Signed)
Received denial of appeal and faxed denial notice to pharmacy.

## 2012-03-17 DIAGNOSIS — Z96659 Presence of unspecified artificial knee joint: Secondary | ICD-10-CM | POA: Diagnosis not present

## 2012-03-17 DIAGNOSIS — Z471 Aftercare following joint replacement surgery: Secondary | ICD-10-CM | POA: Diagnosis not present

## 2012-03-26 DIAGNOSIS — D649 Anemia, unspecified: Secondary | ICD-10-CM | POA: Diagnosis not present

## 2012-03-26 DIAGNOSIS — R5381 Other malaise: Secondary | ICD-10-CM | POA: Diagnosis not present

## 2012-03-26 DIAGNOSIS — Z79899 Other long term (current) drug therapy: Secondary | ICD-10-CM | POA: Diagnosis not present

## 2012-03-26 DIAGNOSIS — Z1331 Encounter for screening for depression: Secondary | ICD-10-CM | POA: Diagnosis not present

## 2012-03-30 DIAGNOSIS — M6281 Muscle weakness (generalized): Secondary | ICD-10-CM | POA: Diagnosis not present

## 2012-04-01 DIAGNOSIS — M6281 Muscle weakness (generalized): Secondary | ICD-10-CM | POA: Diagnosis not present

## 2012-04-03 DIAGNOSIS — M6281 Muscle weakness (generalized): Secondary | ICD-10-CM | POA: Diagnosis not present

## 2012-04-06 DIAGNOSIS — M6281 Muscle weakness (generalized): Secondary | ICD-10-CM | POA: Diagnosis not present

## 2012-04-08 ENCOUNTER — Telehealth: Payer: Self-pay | Admitting: Radiology

## 2012-04-08 DIAGNOSIS — M6281 Muscle weakness (generalized): Secondary | ICD-10-CM | POA: Diagnosis not present

## 2012-04-08 NOTE — Telephone Encounter (Signed)
I have gotten request for prior authorization for Lidoderm, this medication is only covered for post- herpetic neuralgia, is this what it was prescribed for?

## 2012-04-10 DIAGNOSIS — M6281 Muscle weakness (generalized): Secondary | ICD-10-CM | POA: Diagnosis not present

## 2012-04-13 DIAGNOSIS — M6281 Muscle weakness (generalized): Secondary | ICD-10-CM | POA: Diagnosis not present

## 2012-04-13 NOTE — Telephone Encounter (Signed)
DR Conley Rolls advised to Cancel this, was cancelled at pharmacy

## 2012-04-16 DIAGNOSIS — Z96659 Presence of unspecified artificial knee joint: Secondary | ICD-10-CM | POA: Diagnosis not present

## 2012-04-16 DIAGNOSIS — Z471 Aftercare following joint replacement surgery: Secondary | ICD-10-CM | POA: Diagnosis not present

## 2012-04-17 DIAGNOSIS — M6281 Muscle weakness (generalized): Secondary | ICD-10-CM | POA: Diagnosis not present

## 2012-04-21 DIAGNOSIS — M6281 Muscle weakness (generalized): Secondary | ICD-10-CM | POA: Diagnosis not present

## 2012-04-22 DIAGNOSIS — M6281 Muscle weakness (generalized): Secondary | ICD-10-CM | POA: Diagnosis not present

## 2012-05-06 DIAGNOSIS — R5381 Other malaise: Secondary | ICD-10-CM | POA: Diagnosis not present

## 2012-05-06 DIAGNOSIS — R5383 Other fatigue: Secondary | ICD-10-CM | POA: Diagnosis not present

## 2012-05-06 DIAGNOSIS — I10 Essential (primary) hypertension: Secondary | ICD-10-CM | POA: Diagnosis not present

## 2012-05-06 DIAGNOSIS — E782 Mixed hyperlipidemia: Secondary | ICD-10-CM | POA: Diagnosis not present

## 2012-07-08 DIAGNOSIS — Q828 Other specified congenital malformations of skin: Secondary | ICD-10-CM | POA: Diagnosis not present

## 2012-07-14 DIAGNOSIS — Z1231 Encounter for screening mammogram for malignant neoplasm of breast: Secondary | ICD-10-CM | POA: Diagnosis not present

## 2012-07-19 DIAGNOSIS — T148XXA Other injury of unspecified body region, initial encounter: Secondary | ICD-10-CM | POA: Diagnosis not present

## 2012-08-03 DIAGNOSIS — H18519 Endothelial corneal dystrophy, unspecified eye: Secondary | ICD-10-CM | POA: Diagnosis not present

## 2012-08-03 DIAGNOSIS — Z961 Presence of intraocular lens: Secondary | ICD-10-CM | POA: Diagnosis not present

## 2012-08-11 ENCOUNTER — Other Ambulatory Visit: Payer: Self-pay | Admitting: Orthopedic Surgery

## 2012-08-20 DIAGNOSIS — Z9849 Cataract extraction status, unspecified eye: Secondary | ICD-10-CM | POA: Diagnosis not present

## 2012-08-20 DIAGNOSIS — Z96659 Presence of unspecified artificial knee joint: Secondary | ICD-10-CM | POA: Diagnosis not present

## 2012-08-20 DIAGNOSIS — Z885 Allergy status to narcotic agent status: Secondary | ICD-10-CM | POA: Diagnosis not present

## 2012-08-20 DIAGNOSIS — Z888 Allergy status to other drugs, medicaments and biological substances status: Secondary | ICD-10-CM | POA: Diagnosis not present

## 2012-08-20 DIAGNOSIS — K219 Gastro-esophageal reflux disease without esophagitis: Secondary | ICD-10-CM | POA: Diagnosis not present

## 2012-08-20 DIAGNOSIS — Z9889 Other specified postprocedural states: Secondary | ICD-10-CM | POA: Diagnosis not present

## 2012-08-20 DIAGNOSIS — Z87891 Personal history of nicotine dependence: Secondary | ICD-10-CM | POA: Diagnosis not present

## 2012-08-20 DIAGNOSIS — Z947 Corneal transplant status: Secondary | ICD-10-CM | POA: Diagnosis not present

## 2012-08-20 DIAGNOSIS — Z833 Family history of diabetes mellitus: Secondary | ICD-10-CM | POA: Diagnosis not present

## 2012-08-20 DIAGNOSIS — D649 Anemia, unspecified: Secondary | ICD-10-CM | POA: Diagnosis not present

## 2012-08-20 DIAGNOSIS — Z8249 Family history of ischemic heart disease and other diseases of the circulatory system: Secondary | ICD-10-CM | POA: Diagnosis not present

## 2012-08-20 DIAGNOSIS — M129 Arthropathy, unspecified: Secondary | ICD-10-CM | POA: Diagnosis not present

## 2012-08-20 DIAGNOSIS — Z7982 Long term (current) use of aspirin: Secondary | ICD-10-CM | POA: Diagnosis not present

## 2012-08-20 DIAGNOSIS — Z961 Presence of intraocular lens: Secondary | ICD-10-CM | POA: Diagnosis not present

## 2012-08-20 DIAGNOSIS — B0052 Herpesviral keratitis: Secondary | ICD-10-CM | POA: Diagnosis not present

## 2012-08-20 DIAGNOSIS — Z79899 Other long term (current) drug therapy: Secondary | ICD-10-CM | POA: Diagnosis not present

## 2012-08-20 DIAGNOSIS — F341 Dysthymic disorder: Secondary | ICD-10-CM | POA: Diagnosis not present

## 2012-08-20 DIAGNOSIS — E785 Hyperlipidemia, unspecified: Secondary | ICD-10-CM | POA: Diagnosis not present

## 2012-08-20 DIAGNOSIS — Z83511 Family history of glaucoma: Secondary | ICD-10-CM | POA: Diagnosis not present

## 2012-08-20 DIAGNOSIS — Z8269 Family history of other diseases of the musculoskeletal system and connective tissue: Secondary | ICD-10-CM | POA: Diagnosis not present

## 2012-08-20 HISTORY — PX: DESCEMETS STRIPPING AUTOMATED ENDOTHELIAL KERATOPLASTY: SHX1454

## 2012-08-28 ENCOUNTER — Encounter (HOSPITAL_BASED_OUTPATIENT_CLINIC_OR_DEPARTMENT_OTHER): Payer: Self-pay | Admitting: *Deleted

## 2012-08-28 NOTE — Pre-Procedure Instructions (Signed)
To come for BMET 

## 2012-08-31 ENCOUNTER — Encounter (HOSPITAL_BASED_OUTPATIENT_CLINIC_OR_DEPARTMENT_OTHER)
Admission: RE | Admit: 2012-08-31 | Discharge: 2012-08-31 | Disposition: A | Discharge status: Home / Self Care | Payer: Medicare Other | Source: Ambulatory Visit | Attending: Orthopedic Surgery | Admitting: Orthopedic Surgery

## 2012-08-31 DIAGNOSIS — K219 Gastro-esophageal reflux disease without esophagitis: Secondary | ICD-10-CM | POA: Diagnosis not present

## 2012-08-31 DIAGNOSIS — Z96659 Presence of unspecified artificial knee joint: Secondary | ICD-10-CM | POA: Diagnosis not present

## 2012-08-31 DIAGNOSIS — Z01812 Encounter for preprocedural laboratory examination: Secondary | ICD-10-CM | POA: Diagnosis not present

## 2012-08-31 DIAGNOSIS — J45909 Unspecified asthma, uncomplicated: Secondary | ICD-10-CM | POA: Diagnosis not present

## 2012-08-31 DIAGNOSIS — F329 Major depressive disorder, single episode, unspecified: Secondary | ICD-10-CM | POA: Diagnosis not present

## 2012-08-31 DIAGNOSIS — I10 Essential (primary) hypertension: Secondary | ICD-10-CM | POA: Diagnosis not present

## 2012-08-31 DIAGNOSIS — Z79899 Other long term (current) drug therapy: Secondary | ICD-10-CM | POA: Diagnosis not present

## 2012-08-31 DIAGNOSIS — Z7982 Long term (current) use of aspirin: Secondary | ICD-10-CM | POA: Diagnosis not present

## 2012-08-31 DIAGNOSIS — M65839 Other synovitis and tenosynovitis, unspecified forearm: Secondary | ICD-10-CM | POA: Diagnosis not present

## 2012-08-31 LAB — BASIC METABOLIC PANEL
CO2: 27 mEq/L (ref 19–32)
Calcium: 9.2 mg/dL (ref 8.4–10.5)
GFR calc Af Amer: >90 mL/min (ref >90)
GFR calc non Af Amer: 84 mL/min — ABNORMAL LOW (ref >90)
Sodium: 136 mEq/L (ref 135–145)

## 2012-09-02 ENCOUNTER — Ambulatory Visit (HOSPITAL_BASED_OUTPATIENT_CLINIC_OR_DEPARTMENT_OTHER): Payer: Medicare Other | Admitting: Anesthesiology

## 2012-09-02 ENCOUNTER — Encounter (HOSPITAL_BASED_OUTPATIENT_CLINIC_OR_DEPARTMENT_OTHER)
Admission: RE | Disposition: A | Discharge status: Home / Self Care | Payer: Self-pay | Source: Ambulatory Visit | Attending: Orthopedic Surgery

## 2012-09-02 ENCOUNTER — Ambulatory Visit (HOSPITAL_BASED_OUTPATIENT_CLINIC_OR_DEPARTMENT_OTHER)
Admission: RE | Admit: 2012-09-02 | Discharge: 2012-09-02 | Disposition: A | Discharge status: Home / Self Care | Payer: Medicare Other | Source: Ambulatory Visit | Attending: Orthopedic Surgery | Admitting: Orthopedic Surgery

## 2012-09-02 ENCOUNTER — Encounter (HOSPITAL_BASED_OUTPATIENT_CLINIC_OR_DEPARTMENT_OTHER): Payer: Self-pay

## 2012-09-02 ENCOUNTER — Encounter (HOSPITAL_BASED_OUTPATIENT_CLINIC_OR_DEPARTMENT_OTHER): Payer: Self-pay | Admitting: Anesthesiology

## 2012-09-02 DIAGNOSIS — M65839 Other synovitis and tenosynovitis, unspecified forearm: Secondary | ICD-10-CM | POA: Diagnosis not present

## 2012-09-02 DIAGNOSIS — Z96659 Presence of unspecified artificial knee joint: Secondary | ICD-10-CM | POA: Insufficient documentation

## 2012-09-02 DIAGNOSIS — Z79899 Other long term (current) drug therapy: Secondary | ICD-10-CM | POA: Insufficient documentation

## 2012-09-02 DIAGNOSIS — Z7982 Long term (current) use of aspirin: Secondary | ICD-10-CM | POA: Insufficient documentation

## 2012-09-02 DIAGNOSIS — K219 Gastro-esophageal reflux disease without esophagitis: Secondary | ICD-10-CM | POA: Diagnosis not present

## 2012-09-02 DIAGNOSIS — I10 Essential (primary) hypertension: Secondary | ICD-10-CM | POA: Insufficient documentation

## 2012-09-02 DIAGNOSIS — F3289 Other specified depressive episodes: Secondary | ICD-10-CM | POA: Insufficient documentation

## 2012-09-02 DIAGNOSIS — J45909 Unspecified asthma, uncomplicated: Secondary | ICD-10-CM | POA: Insufficient documentation

## 2012-09-02 DIAGNOSIS — Z01812 Encounter for preprocedural laboratory examination: Secondary | ICD-10-CM | POA: Insufficient documentation

## 2012-09-02 HISTORY — DX: Osteoarthritis of knee, unspecified: M17.9

## 2012-09-02 HISTORY — PX: TRIGGER FINGER RELEASE: SHX641

## 2012-09-02 HISTORY — DX: Pure hypercholesterolemia, unspecified: E78.00

## 2012-09-02 HISTORY — DX: Sciatica, unspecified side: M54.30

## 2012-09-02 HISTORY — DX: Personal history of other diseases of the digestive system: Z87.19

## 2012-09-02 HISTORY — DX: Unilateral primary osteoarthritis, unspecified knee: M17.10

## 2012-09-02 SURGERY — RELEASE, A1 PULLEY, FOR TRIGGER FINGER
Anesthesia: Regional | Site: Finger | Laterality: Right | Wound class: Clean

## 2012-09-02 MED ORDER — CHLORHEXIDINE GLUCONATE 4 % EX LIQD: 60.0000 mL | Freq: Once | CUTANEOUS | Status: DC

## 2012-09-02 MED ORDER — PROPOFOL 10 MG/ML IV EMUL
INTRAVENOUS | Status: DC | PRN
  Administered 2012-09-02: 75 ug/kg/min via INTRAVENOUS

## 2012-09-02 MED ORDER — BUPIVACAINE HCL (PF) 0.25 % IJ SOLN
INTRAMUSCULAR | Status: DC | PRN
  Administered 2012-09-02: 6 mL

## 2012-09-02 MED ORDER — FENTANYL CITRATE 0.05 MG/ML IJ SOLN: 50.0000 ug | INTRAMUSCULAR | Status: DC | PRN

## 2012-09-02 MED ORDER — CEFAZOLIN SODIUM-DEXTROSE 2-3 GM-% IV SOLR: 2.0000 g | INTRAVENOUS | Status: DC

## 2012-09-02 MED ORDER — FENTANYL CITRATE 0.05 MG/ML IJ SOLN
INTRAMUSCULAR | Status: DC | PRN
  Administered 2012-09-02: 25 ug via INTRAVENOUS

## 2012-09-02 MED ORDER — LIDOCAINE HCL (CARDIAC) 20 MG/ML IV SOLN
INTRAVENOUS | Status: DC | PRN
  Administered 2012-09-02: 50 mg via INTRAVENOUS

## 2012-09-02 MED ORDER — FENTANYL CITRATE 0.05 MG/ML IJ SOLN: 25.0000 ug | INTRAMUSCULAR | Status: DC | PRN

## 2012-09-02 MED ORDER — LACTATED RINGERS IV SOLN
INTRAVENOUS | Status: DC
  Administered 2012-09-02: 09:00:00 via INTRAVENOUS

## 2012-09-02 MED ORDER — HYDROCODONE-ACETAMINOPHEN 5-325 MG PO TABS: 1.0000 | ORAL_TABLET | Freq: Four times a day (QID) | ORAL | Status: DC | PRN

## 2012-09-02 MED ORDER — LIDOCAINE HCL (PF) 0.5 % IJ SOLN
INTRAMUSCULAR | Status: DC | PRN
  Administered 2012-09-02: 30 mL via INTRAVENOUS

## 2012-09-02 MED ORDER — MIDAZOLAM HCL 2 MG/ML PO SYRP: 12.0000 mg | ORAL_SOLUTION | Freq: Once | ORAL | Status: DC | PRN

## 2012-09-02 MED ORDER — MIDAZOLAM HCL 2 MG/2ML IJ SOLN: 1.0000 mg | INTRAMUSCULAR | Status: DC | PRN

## 2012-09-02 SURGICAL SUPPLY — 34 items
BANDAGE COBAN STERILE 2 (GAUZE/BANDAGES/DRESSINGS) ×2 IMPLANT
BLADE SURG 15 STRL LF DISP TIS (BLADE) ×1 IMPLANT
BLADE SURG 15 STRL SS (BLADE) ×2
BNDG CMPR 9X4 STRL LF SNTH (GAUZE/BANDAGES/DRESSINGS)
BNDG ESMARK 4X9 LF (GAUZE/BANDAGES/DRESSINGS) IMPLANT
CHLORAPREP W/TINT 26ML (MISCELLANEOUS) ×2 IMPLANT
CLOTH BEACON ORANGE TIMEOUT ST (SAFETY) ×2 IMPLANT
CORDS BIPOLAR (ELECTRODE) ×1 IMPLANT
COVER MAYO STAND STRL (DRAPES) ×2 IMPLANT
COVER TABLE BACK 60X90 (DRAPES) ×2 IMPLANT
CUFF TOURNIQUET SINGLE 18IN (TOURNIQUET CUFF) ×1 IMPLANT
DECANTER SPIKE VIAL GLASS SM (MISCELLANEOUS) IMPLANT
DRAPE EXTREMITY T 121X128X90 (DRAPE) ×2 IMPLANT
DRAPE SURG 17X23 STRL (DRAPES) ×2 IMPLANT
GAUZE XEROFORM 1X8 LF (GAUZE/BANDAGES/DRESSINGS) ×2 IMPLANT
GLOVE BIO SURGEON STRL SZ 6.5 (GLOVE) ×2 IMPLANT
GLOVE BIOGEL PI IND STRL 8.5 (GLOVE) ×1 IMPLANT
GLOVE BIOGEL PI INDICATOR 8.5 (GLOVE) ×1
GLOVE SURG ORTHO 8.0 STRL STRW (GLOVE) ×2 IMPLANT
GOWN BRE IMP PREV XXLGXLNG (GOWN DISPOSABLE) ×2 IMPLANT
GOWN PREVENTION PLUS XLARGE (GOWN DISPOSABLE) ×2 IMPLANT
NEEDLE 27GAX1X1/2 (NEEDLE) ×1 IMPLANT
NS IRRIG 1000ML POUR BTL (IV SOLUTION) ×2 IMPLANT
PACK BASIN DAY SURGERY FS (CUSTOM PROCEDURE TRAY) ×2 IMPLANT
PADDING CAST ABS 4INX4YD NS (CAST SUPPLIES) ×1
PADDING CAST ABS COTTON 4X4 ST (CAST SUPPLIES) ×1 IMPLANT
SPONGE GAUZE 4X4 12PLY (GAUZE/BANDAGES/DRESSINGS) ×2 IMPLANT
STOCKINETTE 4X48 STRL (DRAPES) ×2 IMPLANT
SUT VICRYL RAPIDE 4/0 PS 2 (SUTURE) ×2 IMPLANT
SYR BULB 3OZ (MISCELLANEOUS) ×2 IMPLANT
SYR CONTROL 10ML LL (SYRINGE) ×1 IMPLANT
TOWEL OR 17X24 6PK STRL BLUE (TOWEL DISPOSABLE) ×3 IMPLANT
UNDERPAD 30X30 INCONTINENT (UNDERPADS AND DIAPERS) ×2 IMPLANT
WATER STERILE IRR 1000ML POUR (IV SOLUTION) ×1 IMPLANT

## 2012-09-02 NOTE — Anesthesia Postprocedure Evaluation (Signed)
  Anesthesia Post-op Note  Patient: Kristin Solomon  Procedure(s) Performed: Procedure(s): RELEASE TRIGGER FINGER/A-1 PULLEY RIGHT INDEX FINGER (Right)  Patient Location: PACU  Anesthesia Type:MAC and Bier block  Level of Consciousness: awake and alert   Airway and Oxygen Therapy: Patient Spontanous Breathing  Post-op Pain: mild  Post-op Assessment: Post-op Vital signs reviewed, Patient's Cardiovascular Status Stable, Respiratory Function Stable, Patent Airway, No signs of Nausea or vomiting and Pain level controlled  Post-op Vital Signs: stable  Complications: No apparent anesthesia complications

## 2012-09-02 NOTE — Brief Op Note (Signed)
09/02/2012  9:59 AM  PATIENT:  Kristin Solomon  79 y.o. female  PRE-OPERATIVE DIAGNOSIS:  STS RIF  POST-OPERATIVE DIAGNOSIS:  Stenosing Tenosynovitis Right Index Finger  PROCEDURE:  Procedure(s): RELEASE TRIGGER FINGER/A-1 PULLEY RIGHT INDEX FINGER (Right)  SURGEON:  Surgeon(s) and Role:    * Nicki Reaper, MD - Primary  PHYSICIAN ASSISTANT:   ASSISTANTS: none   ANESTHESIA:   local and regional  EBL:  Total I/O In: 300 [I.V.:300] Out: -   BLOOD ADMINISTERED:none  DRAINS: none   LOCAL MEDICATIONS USED:  MARCAINE     SPECIMEN:  No Specimen  DISPOSITION OF SPECIMEN:  N/A  COUNTS:  YES  TOURNIQUET:   Total Tourniquet Time Documented: Forearm (Right) - 15 minutes Total: Forearm (Right) - 15 minutes   DICTATION: .Other Dictation: Dictation Number [id number]  PLAN OF CARE: Discharge to home after PACU  PATIENT DISPOSITION:  PACU - hemodynamically stable.

## 2012-09-02 NOTE — Transfer of Care (Signed)
Immediate Anesthesia Transfer of Care Note  Patient: Kristin Solomon  Procedure(s) Performed: Procedure(s): RELEASE TRIGGER FINGER/A-1 PULLEY RIGHT INDEX FINGER (Right)  Patient Location: PACU  Anesthesia Type:Bier block  Level of Consciousness: awake, alert , oriented and patient cooperative  Airway & Oxygen Therapy: Patient Spontanous Breathing and Patient connected to face mask oxygen  Post-op Assessment: Report given to PACU RN and Post -op Vital signs reviewed and stable  Post vital signs: Reviewed and stable  Complications: No apparent anesthesia complications

## 2012-09-02 NOTE — Anesthesia Preprocedure Evaluation (Signed)
Anesthesia Evaluation  Patient identified by MRN, date of birth, ID band Patient awake    Reviewed: Allergy & Precautions, H&P , NPO status , Patient's Chart, lab work & pertinent test results, reviewed documented beta blocker date and time   Airway Mallampati: II TM Distance: >3 FB   Mouth opening: Limited Mouth Opening  Dental  (+) Dental Advisory Given and Teeth Intact   Pulmonary asthma ,  breath sounds clear to auscultation        Cardiovascular hypertension, Pt. on medications and Pt. on home beta blockers Rhythm:Regular Rate:Normal     Neuro/Psych Depression    GI/Hepatic GERD-  Medicated and Controlled,  Endo/Other    Renal/GU      Musculoskeletal   Abdominal   Peds  Hematology   Anesthesia Other Findings Somewhat narrow mouth  Reproductive/Obstetrics                           Anesthesia Physical Anesthesia Plan  ASA: II  Anesthesia Plan: MAC and Bier Block   Post-op Pain Management:    Induction: Intravenous  Airway Management Planned: Simple Face Mask  Additional Equipment:   Intra-op Plan:   Post-operative Plan:   Informed Consent: I have reviewed the patients History and Physical, chart, labs and discussed the procedure including the risks, benefits and alternatives for the proposed anesthesia with the patient or authorized representative who has indicated his/her understanding and acceptance.     Plan Discussed with: CRNA and Surgeon  Anesthesia Plan Comments:         Anesthesia Quick Evaluation

## 2012-09-02 NOTE — H&P (Signed)
Kristin Solomon is 79 yrs old complaining of catching of her ring finger left hand. This has been going on for at least one month. She continues to have triggering of her right index and she desires having this released as it has been injected on 2 occasions.This has been present for many months.  She recalls no specific history of injury to it. She has not had any treatment. She has tried Tylenol for it. She has a history of arthritis. She has no history of diabetes, thyroid problems or gout. She has no other fingers catching. She has a prior history of trigger fingers on her opposite hand.   Past Medical History: She has no allergies. She is on Lopressor, aspirin, Crestor, Effexor. She has had repair of right and left shoulders and recently had back surgery by Dr. Newell Coral.  Family Medical History: Positive for heart disease and arthritis.  Social History: She does not smoke or drink. She is married and retired.   Review of Systems:hypertension,tachycardia,anemia, TKR. [name] is an 79 y.o. female.   Chief Complaint: STS RT Index HPI: see above  Past Medical History  Diagnosis Date  . Herpes ocular     history of  . GERD (gastroesophageal reflux disease)   . Depression   . Stenosing tenosynovitis of finger 08/2012    right index finger  . High cholesterol   . Hypertension     under control; has been on med. x 10 yr.  . History of hiatal hernia   . Osteoarthritis of knee     bilateral  . Anemia, iron deficiency   . Sciatic pain   . Dental crowns present     Past Surgical History  Procedure Laterality Date  . Lumbar laminectomy/decompression microdiscectomy  08/29/2010    L2-S1  . Shoulder arthroscopy with rotator cuff repair and subacromial decompression Right 01/01/2000  . Total knee arthroplasty Left 05/14/2004  . Abdominal hysterectomy  1995    partial  . Laparoscopic nissen fundoplication  07/03/2001  . Descemets stripping automated endothelial keratoplasty  Left 03/14/2011  . Tonsillectomy      as child  . Colonoscopy    . Total knee arthroplasty  12/23/2011    Procedure: TOTAL KNEE ARTHROPLASTY;  Surgeon: Nilda Simmer, MD;  Location: [facility] OR;  Service: Orthopedics;  Laterality: Right;  DR Thurston Hole WANTS 90 MINUTES FOR THIS CASE  . Knee arthroscopy Right 05/11/2001  . Trigger finger release Right 07/21/2007    thumb  . Carpal tunnel release Right 07/21/2007  . Trigger finger release Left 09/24/2007    middle finger  . Carpal tunnel release Left 09/24/2007  . Trigger finger release Right 03/10/2008    ring and little fingers  . Descemets stripping automated endothelial keratoplasty Right 08/20/2012    Family History  Problem Relation Age of Onset  . Heart attack Mother   . Parkinsonism Father   . Diabetes Brother    Social History:  reports that she has quit smoking. She has never used smokeless tobacco. She reports that she does not drink alcohol or use illicit drugs.  Allergies:  Allergies  Allergen Reactions  . Morphine And Related Other (See Comments)    AGITATION, STRANGE DREAMS  . Scopace (Scopolamine) Other (See Comments)    MENTAL CHANGES    Medications Prior to Admission  Medication Sig Dispense Refill  . aspirin 81 MG tablet Take 81 mg by mouth daily.      . celecoxib (CELEBREX) 200 MG capsule Take  200 mg by mouth 2 (two) times daily.      . cholecalciferol (VITAMIN D) 1000 UNITS tablet Take 2,000 Units by mouth daily.      . Ferrous Gluconate (IRON) 240 (27 FE) MG TABS Take 1 tablet by mouth 4 (four) times a week.       . metoprolol (LOPRESSOR) 50 MG tablet Take 25-50 mg by mouth 2 (two) times daily. Take 50 MG in the morning and take 25 MG at bedtime.      Marland Kitchen omeprazole (PRILOSEC) 40 MG capsule Take 40 mg by mouth 2 (two) times daily.       . prednisoLONE acetate (PRED FORTE) 1 % ophthalmic suspension Place 1 drop into the right eye 4 (four) times daily.       . rosuvastatin (CRESTOR) 10 MG tablet Take 10 mg by mouth daily.       Marland Kitchen senna (SENOKOT) 8.6 MG TABS Take 1 tablet by mouth.      . valACYclovir (VALTREX) 500 MG tablet Take 800 mg by mouth daily.       Marland Kitchen venlafaxine XR (EFFEXOR-XR) 75 MG 24 hr capsule Take 150 mg by mouth at bedtime.         Results for orders placed during the hospital encounter of 09/02/12 (from the past 48 hour(s))  BASIC METABOLIC PANEL     Status: Abnormal   Collection Time    08/31/12  9:25 AM      Result Value Range   Sodium 136  135 - 145 mEq/L   Potassium 4.5  3.5 - 5.1 mEq/L   Chloride 100  96 - 112 mEq/L   CO2 27  19 - 32 mEq/L   Glucose, Bld 101 (*) 70 - 99 mg/dL   BUN 13  6 - 23 mg/dL   Creatinine, Ser 1.61  0.50 - 1.10 mg/dL   Calcium 9.2  8.4 - 09.6 mg/dL   GFR calc non Af Amer 84 (*) >90 mL/min   GFR calc Af Amer >90  >90 mL/min   Comment:            The eGFR has been calculated     using the CKD EPI equation.     This calculation has not been     validated in all clinical     situations.     eGFR's persistently     <90 mL/min signify     possible Chronic Kidney Disease.    No results found.   Pertinent items are noted in HPI.  Height 5\' 2"  (1.575 m), weight 61.236 kg (135 lb).  General appearance: alert, cooperative and appears stated age Head: Normocephalic, without obvious abnormality Neck: no JVD Resp: clear to auscultation bilaterally Cardio: regular rate and rhythm, S1, S2 normal, no murmur, click, rub or gallop GI: soft, non-tender; bowel sounds normal; no masses,  no organomegaly Extremities: extremities normal, atraumatic, no cyanosis or edema Pulses: 2+ and symmetric Skin: Skin color, texture, turgor normal. No rashes or lesions Neurologic: Grossly normal Incision/Wound: na  Assessment/Plan DX : STS TR Index Plan : release rt index trigger  Aniyiah Zell R 09/02/2012, 8:31 AM

## 2012-09-02 NOTE — Op Note (Signed)
Dictation Number [id number]

## 2012-09-03 NOTE — Op Note (Signed)
NAME:  TIONNA, GIGANTE NO.:  [id number]  MEDICAL RECORD NO.:  [id number]  LOCATION:                                 FACILITY:  PHYSICIAN:  Cindee Salt, M.D.            DATE OF BIRTH:  DATE OF PROCEDURE:  09/02/2012 DATE OF DISCHARGE:                              OPERATIVE REPORT   PREOPERATIVE DIAGNOSIS:  Stenosing tenosynovitis, right index finger.  POSTOPERATIVE DIAGNOSIS:  Stenosing tenosynovitis, right index finger.  OPERATION:  Release A1 pulley, right index finger.  SURGEON:  Cindee Salt, M.D.  ANESTHESIA:  Forearm-based IV regional with local infiltration.  ANESTHESIOLOGIST:  Bedelia Person, M.D.  HISTORY:  The patient is an 79 year old female with a history of triggering of her right index finger.  This did not responded to conservative treatment including 2 injections.  Pre and postoperative course have been discussed along with risks and complications in that she has elected to undergo surgical release.  She is aware that there is no guarantee with the surgery; possibility of infection; recurrence of injury to arteries, nerves, tendons; incomplete relief of symptoms; dystrophy in the preoperative area.  The patient is seen, the extremity marked by both patient and surgeon.  Antibiotic given.  PROCEDURE:  The patient was brought to the operating room where a forearm-based IV regional anesthetic was carried out without difficulty. She was prepped using ChloraPrep, supine position, right arm free.  A 3- minute dry time was allowed.  Time-out taken, confirming patient and procedure.  Oblique incision was made over the A1 pulley, right index finger carried down through subcutaneous tissue.  Bleeders were electrocauterized with bipolar.  The A1 pulley was identified.  This was released on its radial aspect.  Small incision was made centrally in A2 and a partial tenosynovectomy performed proximally separating the superficialis and profundus tendons, which  were adherent to each other. The finger was placed through a full range motion, no further triggering was noted.  The wound was irrigated and the skin closed with interrupted 4-0 Vicryl Rapide sutures.  A sterile compressive dressing with the fingers free was applied.  On deflation of the tourniquet, all fingers immediately pinked.  She was taken to the recovery room for observation in satisfactory condition.  She will be discharged home to return in [facility] [place] in 1 week on Norco.          ______________________________ Cindee Salt, M.D.     GK/MEDQ  D:  09/02/2012  T:  09/03/2012  Job:  [id number]

## 2012-11-05 DIAGNOSIS — M1710 Unilateral primary osteoarthritis, unspecified knee: Secondary | ICD-10-CM | POA: Diagnosis not present

## 2012-12-09 DIAGNOSIS — I10 Essential (primary) hypertension: Secondary | ICD-10-CM | POA: Diagnosis not present

## 2012-12-09 DIAGNOSIS — E782 Mixed hyperlipidemia: Secondary | ICD-10-CM | POA: Diagnosis not present

## 2012-12-09 DIAGNOSIS — D649 Anemia, unspecified: Secondary | ICD-10-CM | POA: Diagnosis not present

## 2012-12-09 DIAGNOSIS — E559 Vitamin D deficiency, unspecified: Secondary | ICD-10-CM | POA: Diagnosis not present

## 2012-12-09 DIAGNOSIS — Z79899 Other long term (current) drug therapy: Secondary | ICD-10-CM | POA: Diagnosis not present

## 2012-12-09 DIAGNOSIS — R5381 Other malaise: Secondary | ICD-10-CM | POA: Diagnosis not present

## 2012-12-09 DIAGNOSIS — R5383 Other fatigue: Secondary | ICD-10-CM | POA: Diagnosis not present

## 2012-12-09 DIAGNOSIS — M545 Low back pain: Secondary | ICD-10-CM | POA: Diagnosis not present

## 2012-12-09 DIAGNOSIS — B0232 Zoster iridocyclitis: Secondary | ICD-10-CM | POA: Diagnosis not present

## 2012-12-09 DIAGNOSIS — Z0000 Encounter for general adult medical examination without abnormal findings: Secondary | ICD-10-CM | POA: Diagnosis not present

## 2012-12-09 DIAGNOSIS — M1710 Unilateral primary osteoarthritis, unspecified knee: Secondary | ICD-10-CM | POA: Diagnosis not present

## 2012-12-14 ENCOUNTER — Other Ambulatory Visit: Payer: Self-pay | Admitting: Orthopedic Surgery

## 2012-12-16 ENCOUNTER — Encounter (HOSPITAL_BASED_OUTPATIENT_CLINIC_OR_DEPARTMENT_OTHER): Payer: Self-pay | Admitting: *Deleted

## 2012-12-16 NOTE — Progress Notes (Signed)
[facility] living [facility] here [facility]/14 for a TF-[facility] up-and will have someone stay with her post op-just had labs at dr gates-called for notes-

## 2012-12-22 ENCOUNTER — Ambulatory Visit (HOSPITAL_BASED_OUTPATIENT_CLINIC_OR_DEPARTMENT_OTHER): Payer: Medicare Other | Admitting: Anesthesiology

## 2012-12-22 ENCOUNTER — Encounter (HOSPITAL_BASED_OUTPATIENT_CLINIC_OR_DEPARTMENT_OTHER)
Admission: RE | Disposition: A | Discharge status: Home / Self Care | Payer: Self-pay | Source: Ambulatory Visit | Attending: Orthopedic Surgery

## 2012-12-22 ENCOUNTER — Encounter (HOSPITAL_BASED_OUTPATIENT_CLINIC_OR_DEPARTMENT_OTHER): Payer: Self-pay | Admitting: Orthopedic Surgery

## 2012-12-22 ENCOUNTER — Ambulatory Visit (HOSPITAL_BASED_OUTPATIENT_CLINIC_OR_DEPARTMENT_OTHER)
Admission: RE | Admit: 2012-12-22 | Discharge: 2012-12-22 | Disposition: A | Discharge status: Home / Self Care | Payer: Medicare Other | Source: Ambulatory Visit | Attending: Orthopedic Surgery | Admitting: Orthopedic Surgery

## 2012-12-22 ENCOUNTER — Encounter (HOSPITAL_BASED_OUTPATIENT_CLINIC_OR_DEPARTMENT_OTHER): Payer: Self-pay | Admitting: Anesthesiology

## 2012-12-22 DIAGNOSIS — B009 Herpesviral infection, unspecified: Secondary | ICD-10-CM | POA: Insufficient documentation

## 2012-12-22 DIAGNOSIS — Z8249 Family history of ischemic heart disease and other diseases of the circulatory system: Secondary | ICD-10-CM | POA: Insufficient documentation

## 2012-12-22 DIAGNOSIS — I10 Essential (primary) hypertension: Secondary | ICD-10-CM | POA: Insufficient documentation

## 2012-12-22 DIAGNOSIS — M129 Arthropathy, unspecified: Secondary | ICD-10-CM | POA: Insufficient documentation

## 2012-12-22 DIAGNOSIS — Z888 Allergy status to other drugs, medicaments and biological substances status: Secondary | ICD-10-CM | POA: Insufficient documentation

## 2012-12-22 DIAGNOSIS — M6530 Trigger finger, unspecified finger: Secondary | ICD-10-CM | POA: Insufficient documentation

## 2012-12-22 DIAGNOSIS — Z87891 Personal history of nicotine dependence: Secondary | ICD-10-CM | POA: Diagnosis not present

## 2012-12-22 DIAGNOSIS — Z8261 Family history of arthritis: Secondary | ICD-10-CM | POA: Diagnosis not present

## 2012-12-22 DIAGNOSIS — D509 Iron deficiency anemia, unspecified: Secondary | ICD-10-CM | POA: Insufficient documentation

## 2012-12-22 DIAGNOSIS — F329 Major depressive disorder, single episode, unspecified: Secondary | ICD-10-CM | POA: Insufficient documentation

## 2012-12-22 DIAGNOSIS — D649 Anemia, unspecified: Secondary | ICD-10-CM | POA: Diagnosis not present

## 2012-12-22 DIAGNOSIS — M1710 Unilateral primary osteoarthritis, unspecified knee: Secondary | ICD-10-CM | POA: Diagnosis not present

## 2012-12-22 DIAGNOSIS — F3289 Other specified depressive episodes: Secondary | ICD-10-CM | POA: Insufficient documentation

## 2012-12-22 DIAGNOSIS — I519 Heart disease, unspecified: Secondary | ICD-10-CM | POA: Diagnosis not present

## 2012-12-22 DIAGNOSIS — E7800 Pure hypercholesterolemia, unspecified: Secondary | ICD-10-CM | POA: Insufficient documentation

## 2012-12-22 DIAGNOSIS — M5430 Sciatica, unspecified side: Secondary | ICD-10-CM | POA: Insufficient documentation

## 2012-12-22 DIAGNOSIS — Z885 Allergy status to narcotic agent status: Secondary | ICD-10-CM | POA: Diagnosis not present

## 2012-12-22 DIAGNOSIS — K219 Gastro-esophageal reflux disease without esophagitis: Secondary | ICD-10-CM | POA: Insufficient documentation

## 2012-12-22 HISTORY — PX: TRIGGER FINGER RELEASE: SHX641

## 2012-12-22 SURGERY — RELEASE, A1 PULLEY, FOR TRIGGER FINGER
Anesthesia: Monitor Anesthesia Care | Site: Hand | Laterality: Left | Wound class: Clean

## 2012-12-22 MED ORDER — CHLORHEXIDINE GLUCONATE 4 % EX LIQD: 60.0000 mL | Freq: Once | CUTANEOUS | Status: DC

## 2012-12-22 MED ORDER — LACTATED RINGERS IV SOLN
INTRAVENOUS | Status: DC
  Administered 2012-12-22: 09:00:00 via INTRAVENOUS

## 2012-12-22 MED ORDER — HYDROCODONE-ACETAMINOPHEN 5-325 MG PO TABS: 1.0000 | ORAL_TABLET | Freq: Four times a day (QID) | ORAL | Status: DC | PRN

## 2012-12-22 MED ORDER — ONDANSETRON HCL 4 MG/2ML IJ SOLN: 4.0000 mg | Freq: Once | INTRAMUSCULAR | Status: DC | PRN

## 2012-12-22 MED ORDER — FENTANYL CITRATE 0.05 MG/ML IJ SOLN
50.0000 ug | INTRAMUSCULAR | Status: DC | PRN
Start: 2012-12-22 — End: 2012-12-22

## 2012-12-22 MED ORDER — PROPOFOL 10 MG/ML IV BOLUS
INTRAVENOUS | Status: DC | PRN
  Administered 2012-12-22: 10 mg via INTRAVENOUS

## 2012-12-22 MED ORDER — ONDANSETRON HCL 4 MG/2ML IJ SOLN
INTRAMUSCULAR | Status: DC | PRN
  Administered 2012-12-22: 4 mg via INTRAVENOUS

## 2012-12-22 MED ORDER — BUPIVACAINE HCL (PF) 0.25 % IJ SOLN
INTRAMUSCULAR | Status: DC | PRN
  Administered 2012-12-22: 4 mL

## 2012-12-22 MED ORDER — LIDOCAINE HCL (PF) 0.5 % IJ SOLN
INTRAMUSCULAR | Status: DC | PRN
  Administered 2012-12-22: 30 mL via INTRAVENOUS

## 2012-12-22 MED ORDER — FENTANYL CITRATE 0.05 MG/ML IJ SOLN: 25.0000 ug | INTRAMUSCULAR | Status: DC | PRN

## 2012-12-22 MED ORDER — FENTANYL CITRATE 0.05 MG/ML IJ SOLN
INTRAMUSCULAR | Status: DC | PRN
  Administered 2012-12-22: 50 ug via INTRAVENOUS

## 2012-12-22 MED ORDER — PROPOFOL INFUSION 10 MG/ML OPTIME
INTRAVENOUS | Status: DC | PRN
  Administered 2012-12-22: 50 ug/kg/min via INTRAVENOUS

## 2012-12-22 MED ORDER — CEFAZOLIN SODIUM-DEXTROSE 2-3 GM-% IV SOLR: 2.0000 g | INTRAVENOUS | Status: DC

## 2012-12-22 MED ORDER — MIDAZOLAM HCL 2 MG/2ML IJ SOLN: 1.0000 mg | INTRAMUSCULAR | Status: DC | PRN

## 2012-12-22 SURGICAL SUPPLY — 36 items
BANDAGE COBAN STERILE 2 (GAUZE/BANDAGES/DRESSINGS) ×2 IMPLANT
BLADE SURG 15 STRL LF DISP TIS (BLADE) ×1 IMPLANT
BLADE SURG 15 STRL SS (BLADE) ×2
BNDG CMPR 9X4 STRL LF SNTH (GAUZE/BANDAGES/DRESSINGS)
BNDG ESMARK 4X9 LF (GAUZE/BANDAGES/DRESSINGS) IMPLANT
CHLORAPREP W/TINT 26ML (MISCELLANEOUS) ×2 IMPLANT
CLOTH BEACON ORANGE TIMEOUT ST (SAFETY) ×2 IMPLANT
CORDS BIPOLAR (ELECTRODE) IMPLANT
COVER MAYO STAND STRL (DRAPES) ×2 IMPLANT
COVER TABLE BACK 60X90 (DRAPES) ×2 IMPLANT
CUFF TOURNIQUET SINGLE 18IN (TOURNIQUET CUFF) ×1 IMPLANT
DECANTER SPIKE VIAL GLASS SM (MISCELLANEOUS) IMPLANT
DRAPE EXTREMITY T 121X128X90 (DRAPE) ×2 IMPLANT
DRAPE SURG 17X23 STRL (DRAPES) ×2 IMPLANT
GAUZE XEROFORM 1X8 LF (GAUZE/BANDAGES/DRESSINGS) ×2 IMPLANT
GLOVE BIO SURGEON STRL SZ 6.5 (GLOVE) ×2 IMPLANT
GLOVE BIO SURGEON STRL SZ7.5 (GLOVE) ×1 IMPLANT
GLOVE BIOGEL PI IND STRL 8 (GLOVE) IMPLANT
GLOVE BIOGEL PI IND STRL 8.5 (GLOVE) ×1 IMPLANT
GLOVE BIOGEL PI INDICATOR 8 (GLOVE) ×1
GLOVE BIOGEL PI INDICATOR 8.5 (GLOVE) ×1
GLOVE SURG ORTHO 8.0 STRL STRW (GLOVE) ×2 IMPLANT
GOWN BRE IMP PREV XXLGXLNG (GOWN DISPOSABLE) ×3 IMPLANT
GOWN PREVENTION PLUS XLARGE (GOWN DISPOSABLE) ×2 IMPLANT
NEEDLE 27GAX1X1/2 (NEEDLE) ×2 IMPLANT
NS IRRIG 1000ML POUR BTL (IV SOLUTION) ×2 IMPLANT
PACK BASIN DAY SURGERY FS (CUSTOM PROCEDURE TRAY) ×2 IMPLANT
PADDING CAST ABS 4INX4YD NS (CAST SUPPLIES)
PADDING CAST ABS COTTON 4X4 ST (CAST SUPPLIES) ×1 IMPLANT
SPONGE GAUZE 4X4 12PLY (GAUZE/BANDAGES/DRESSINGS) ×2 IMPLANT
STOCKINETTE 4X48 STRL (DRAPES) ×2 IMPLANT
SUT VICRYL RAPIDE 4/0 PS 2 (SUTURE) ×2 IMPLANT
SYR BULB 3OZ (MISCELLANEOUS) ×2 IMPLANT
SYR CONTROL 10ML LL (SYRINGE) ×2 IMPLANT
TOWEL OR 17X24 6PK STRL BLUE (TOWEL DISPOSABLE) ×4 IMPLANT
UNDERPAD 30X30 INCONTINENT (UNDERPADS AND DIAPERS) ×2 IMPLANT

## 2012-12-22 NOTE — Brief Op Note (Signed)
12/22/2012  10:00 AM  PATIENT:  Kristin Solomon  79 y.o. female  PRE-OPERATIVE DIAGNOSIS:  STENOSING TENOSYNOVITIS LEFT RING FINGER  POST-OPERATIVE DIAGNOSIS:  STENOSING TENOSYNOVITIS LEFT RING FINGER  PROCEDURE:  Procedure(s) with comments: RELEASE TRIGGER FINGER/A-1 PULLEY LEFT RING FINGER (Left) - Left   SURGEON:  Surgeon(s) and Role:    * Nicki Reaper, MD - Primary    * Tami Ribas, MD - Assisting  PHYSICIAN ASSISTANT:   ASSISTANTS: K Sonia Bromell,Md   ANESTHESIA:   local and regional  EBL:  Total I/O In: 100 [I.V.:100] Out: -   BLOOD ADMINISTERED:none  DRAINS: none   LOCAL MEDICATIONS USED:  MARCAINE     SPECIMEN:  No Specimen  DISPOSITION OF SPECIMEN:  N/A  COUNTS:  YES  TOURNIQUET:   Total Tourniquet Time Documented: Forearm (Left) - 17 minutes Total: Forearm (Left) - 17 minutes   DICTATION: .Other Dictation: Dictation Number [id number]  PLAN OF CARE: Discharge to home after PACU  PATIENT DISPOSITION:  PACU - hemodynamically stable.

## 2012-12-22 NOTE — Transfer of Care (Signed)
Immediate Anesthesia Transfer of Care Note  Patient: Kristin Solomon  Procedure(s) Performed: Procedure(s) with comments: RELEASE TRIGGER FINGER/A-1 PULLEY LEFT RING FINGER (Left) - Left   Patient Location: PACU  Anesthesia Type:MAC and Bier block  Level of Consciousness: awake, alert  and oriented  Airway & Oxygen Therapy: Patient Spontanous Breathing  Post-op Assessment: Report given to PACU RN and Post -op Vital signs reviewed and stable  Post vital signs: Reviewed and stable  Complications: No apparent anesthesia complications

## 2012-12-22 NOTE — Anesthesia Procedure Notes (Signed)
Anesthesia Regional Block:  Bier block (IV Regional)  Pre-Anesthetic Checklist: ,, timeout performed, Correct Patient, Correct Site, Correct Laterality, Correct Procedure,, site marked, surgical consent,, at surgeon's request Needles:  Injection technique: Single-shot  Needle Type: Other      Needle Gauge: 22 and 22 G    Additional Needles: Bier block (IV Regional) Narrative:   Performed by: Personally   Bier block (IV Regional)

## 2012-12-22 NOTE — Op Note (Signed)
Dictation Number [id number]

## 2012-12-22 NOTE — Anesthesia Preprocedure Evaluation (Signed)
Anesthesia Evaluation  Patient identified by MRN, date of birth, ID band Patient awake    Reviewed: Allergy & Precautions, H&P , NPO status , Patient's Chart, lab work & pertinent test results, reviewed documented beta blocker date and time   Airway Mallampati: I TM Distance: >3 FB Neck ROM: Full    Dental  (+) Teeth Intact and Dental Advisory Given   Pulmonary  breath sounds clear to auscultation        Cardiovascular hypertension, Pt. on medications and Pt. on home beta blockers Rhythm:Regular Rate:Normal     Neuro/Psych    GI/Hepatic GERD-  Medicated and Controlled,  Endo/Other    Renal/GU      Musculoskeletal   Abdominal   Peds  Hematology   Anesthesia Other Findings   Reproductive/Obstetrics                           Anesthesia Physical Anesthesia Plan  ASA: II  Anesthesia Plan: Bier Block and MAC   Post-op Pain Management:    Induction: Intravenous  Airway Management Planned: Simple Face Mask  Additional Equipment:   Intra-op Plan:   Post-operative Plan:   Informed Consent: I have reviewed the patients History and Physical, chart, labs and discussed the procedure including the risks, benefits and alternatives for the proposed anesthesia with the patient or authorized representative who has indicated his/her understanding and acceptance.   Dental advisory given  Plan Discussed with: CRNA, Anesthesiologist and Surgeon  Anesthesia Plan Comments:         Anesthesia Quick Evaluation

## 2012-12-22 NOTE — H&P (Signed)
Kristin Solomon is an 79 yo female complaining of catching of her left ring finger. This has been going on for at least one month. She has had all fingers on her right side released, the thumb and middle on her left released. She is She recalls no specific history of injury to it. She has not had any treatment. She has tried Tylenol for it. She has a history of arthritis. She has no history of diabetes, thyroid problems or gout. She has no other fingers catching. She has a prior history of trigger fingers on her opposite hand.   Past Medical History: She has no allergies. She is on Lopressor, aspirin, Crestor, Effexor. She has had repair of right and left shoulders and recently had back surgery by Dr. Newell Coral.  Family Medical History: Positive for heart disease and arthritis.  Social History: She does not smoke or drink. She is married and retired.   Review of Systems: hypertension, anemia,arthritis heart disease. Kristin Solomon is an 79 y.o. female.   Chief Complaint: sts left ring finger HPI: see above  Past Medical History  Diagnosis Date  . Herpes ocular     history of  . GERD (gastroesophageal reflux disease)   . Depression   . Stenosing tenosynovitis of finger 08/2012    right index finger  . High cholesterol   . Hypertension     under control; has been on med. x 10 yr.  . History of hiatal hernia   . Osteoarthritis of knee     bilateral  . Anemia, iron deficiency   . Sciatic pain   . Dental crowns present     Past Surgical History  Procedure Laterality Date  . Lumbar laminectomy/decompression microdiscectomy  08/29/2010    L2-S1  . Shoulder arthroscopy with rotator cuff repair and subacromial decompression Right 01/01/2000  . Total knee arthroplasty Left 05/14/2004  . Abdominal hysterectomy  1995    partial  . Laparoscopic nissen fundoplication  07/03/2001  . Descemets stripping automated endothelial keratoplasty Left 03/14/2011  . Tonsillectomy      as child  .  Colonoscopy    . Total knee arthroplasty  12/23/2011    Procedure: TOTAL KNEE ARTHROPLASTY;  Surgeon: Nilda Simmer, MD;  Location: [facility] OR;  Service: Orthopedics;  Laterality: Right;  DR Thurston Hole WANTS 90 MINUTES FOR THIS CASE  . Knee arthroscopy Right 05/11/2001  . Trigger finger release Right 07/21/2007    thumb  . Carpal tunnel release Right 07/21/2007  . Trigger finger release Left 09/24/2007    middle finger  . Carpal tunnel release Left 09/24/2007  . Trigger finger release Right 03/10/2008    ring and little fingers  . Descemets stripping automated endothelial keratoplasty Right 08/20/2012  . Trigger finger release Right 09/02/2012    Procedure: RELEASE TRIGGER FINGER/A-1 PULLEY RIGHT INDEX FINGER;  Surgeon: Nicki Reaper, MD;  Location: [facility];  Service: Orthopedics;  Laterality: Right;    Family History  Problem Relation Age of Onset  . Heart attack Mother   . Parkinsonism Father   . Diabetes Brother    Social History:  reports that she has quit smoking. She has never used smokeless tobacco. She reports that she does not drink alcohol or use illicit drugs.  Allergies:  Allergies  Allergen Reactions  . Morphine And Related Other (See Comments)    AGITATION, STRANGE DREAMS  . Scopace (Scopolamine) Other (See Comments)    MENTAL CHANGES    No prescriptions prior  to admission    No results found for this or any previous visit (from the past 48 hour(s)).  No results found.   Pertinent items are noted in HPI.  Height 5\' 2"  (1.575 m), weight 60.328 kg (133 lb).  General appearance: alert, cooperative and appears stated age Head: Normocephalic, without obvious abnormality Neck: no JVD Resp: clear to auscultation bilaterally Cardio: regular rate and rhythm, S1, S2 normal, no murmur, click, rub or gallop GI: soft, non-tender; bowel sounds normal; no masses,  no organomegaly Extremities: extremities normal, atraumatic, no cyanosis or edema Pulses: 2+ and  symmetric Skin: Skin color, texture, turgor normal. No rashes or lesions Neurologic: Grossly normal Incision/Wound: na  Assessment/Plan  She would like to proceed to have this surgically released. . She is scheduled for release A-1 pulley left ring finger as an outpatient under regional anesthesia.  Hollynn Garno R 12/22/2012, 7:42 AM

## 2012-12-22 NOTE — Anesthesia Postprocedure Evaluation (Signed)
  Anesthesia Post-op Note  Patient: Kristin Solomon  Procedure(s) Performed: Procedure(s) with comments: RELEASE TRIGGER FINGER/A-1 PULLEY LEFT RING FINGER (Left) - Left   Patient Location: PACU  Anesthesia Type:Bier block and MAC combined with regional for post-op pain  Level of Consciousness: awake, alert  and oriented  Airway and Oxygen Therapy: Patient Spontanous Breathing  Post-op Pain: none  Post-op Assessment: Post-op Vital signs reviewed  Post-op Vital Signs: Reviewed  Complications: No apparent anesthesia complications

## 2012-12-23 NOTE — Op Note (Signed)
NAME:  Kristin Solomon, Kristin Solomon NO.:  [id number]  MEDICAL RECORD NO.:  [id number]  LOCATION:                                 FACILITY:  PHYSICIAN:  Cindee Salt, M.D.            DATE OF BIRTH:  DATE OF PROCEDURE:  12/22/2012 DATE OF DISCHARGE:                              OPERATIVE REPORT   PREOPERATIVE DIAGNOSIS:  Stenosing tenosynovitis, left ring finger.  POSTOPERATIVE DIAGNOSIS:  Stenosing tenosynovitis, left ring finger.  OPERATION:  Release A1 pulley, left ring finger.  SURGEON:  Cindee Salt, MD  ASSISTANT:  Betha Loa, MD  ANESTHESIA:  Forearm-based IV regional with local infiltration.  ANESTHESIOLOGIST:  Sheldon Silvan, MD  HISTORY:  The patient is an 79 year old female with multiple trigger fingers.  She has had virtually all of her fingers on her right side released.  She is admitted now for trigger of her left unresponsive to conservative treatment.  She has elected to undergo surgical release. Pre, peri, and postoperative course have been discussed along with risks and complications.  She is aware there is no guarantee with the surgery; possibility of infection; recurrence of injury to arteries, nerves, tendons; incomplete relief of symptoms, and dystrophy.  In the preoperative area, the patient is seen, the extremity marked by both patient and surgeon.  Antibiotic given.  PROCEDURE:  The patient was brought to the operating room where a forearm IV regional anesthetic was carried out without difficulty.  She was prepped using ChloraPrep, supine position, left arm free.  A 3- minute dry time was allowed.  Time-out taken, confirming the patient and procedure.  An oblique incision was made over the left ring finger A1 pulley, carried down through subcutaneous tissue.  The digital nerve was draped over the central aspect of the A1 pulley.  This was identified and retracted.  A small incision was made central on the radial aspect of the A1 pulley and a  release performed on the radial side.  A small incision was made centrally in A2, and a partial tenosynovectomy performed proximally, the finger placed through a full range motion, no further triggering was noted.  The wound was irrigated and closed with interrupted 4-0 Vicryl Rapide sutures.  A local infiltration with 0.25% Marcaine without epinephrine was given, 4 mL was used.  Sterile compressive dressing with fingers free was applied.  On deflation of the tourniquet, all fingers immediately pinked.  She was taken to the recovery room for observation in satisfactory condition.  She will be discharged home to return in 1 week on Norco.          ______________________________ Cindee Salt, M.D.     GK/MEDQ  D:  12/22/2012  T:  12/22/2012  Job:  [id number]

## 2012-12-24 ENCOUNTER — Encounter (HOSPITAL_BASED_OUTPATIENT_CLINIC_OR_DEPARTMENT_OTHER): Payer: Self-pay | Admitting: Orthopedic Surgery

## 2012-12-30 DIAGNOSIS — Z947 Corneal transplant status: Secondary | ICD-10-CM | POA: Diagnosis not present

## 2013-01-27 DIAGNOSIS — Z947 Corneal transplant status: Secondary | ICD-10-CM | POA: Diagnosis not present

## 2013-02-26 DIAGNOSIS — M6530 Trigger finger, unspecified finger: Secondary | ICD-10-CM | POA: Diagnosis not present

## 2013-03-11 DIAGNOSIS — M1710 Unilateral primary osteoarthritis, unspecified knee: Secondary | ICD-10-CM | POA: Diagnosis not present

## 2013-03-17 DIAGNOSIS — Z79899 Other long term (current) drug therapy: Secondary | ICD-10-CM | POA: Diagnosis not present

## 2013-03-17 DIAGNOSIS — E782 Mixed hyperlipidemia: Secondary | ICD-10-CM | POA: Diagnosis not present

## 2013-03-17 DIAGNOSIS — D500 Iron deficiency anemia secondary to blood loss (chronic): Secondary | ICD-10-CM | POA: Diagnosis not present

## 2013-03-17 DIAGNOSIS — R350 Frequency of micturition: Secondary | ICD-10-CM | POA: Diagnosis not present

## 2013-03-17 DIAGNOSIS — R5381 Other malaise: Secondary | ICD-10-CM | POA: Diagnosis not present

## 2013-04-01 DIAGNOSIS — H40009 Preglaucoma, unspecified, unspecified eye: Secondary | ICD-10-CM | POA: Diagnosis not present

## 2013-04-01 DIAGNOSIS — H02409 Unspecified ptosis of unspecified eyelid: Secondary | ICD-10-CM | POA: Diagnosis not present

## 2013-04-07 DIAGNOSIS — Z23 Encounter for immunization: Secondary | ICD-10-CM | POA: Diagnosis not present

## 2013-04-08 ENCOUNTER — Emergency Department (HOSPITAL_COMMUNITY): Payer: Medicare Other

## 2013-04-08 ENCOUNTER — Emergency Department (HOSPITAL_COMMUNITY)
Admission: EM | Admit: 2013-04-08 | Discharge: 2013-04-08 | Disposition: A | Discharge status: Home / Self Care | Payer: Medicare Other | Attending: Emergency Medicine | Admitting: Emergency Medicine

## 2013-04-08 ENCOUNTER — Encounter (HOSPITAL_COMMUNITY): Payer: Self-pay | Admitting: Emergency Medicine

## 2013-04-08 DIAGNOSIS — M1710 Unilateral primary osteoarthritis, unspecified knee: Secondary | ICD-10-CM | POA: Insufficient documentation

## 2013-04-08 DIAGNOSIS — Z87891 Personal history of nicotine dependence: Secondary | ICD-10-CM | POA: Diagnosis not present

## 2013-04-08 DIAGNOSIS — S0180XA Unspecified open wound of other part of head, initial encounter: Secondary | ICD-10-CM | POA: Insufficient documentation

## 2013-04-08 DIAGNOSIS — Z8619 Personal history of other infectious and parasitic diseases: Secondary | ICD-10-CM | POA: Insufficient documentation

## 2013-04-08 DIAGNOSIS — Z7982 Long term (current) use of aspirin: Secondary | ICD-10-CM | POA: Insufficient documentation

## 2013-04-08 DIAGNOSIS — Z98811 Dental restoration status: Secondary | ICD-10-CM | POA: Diagnosis not present

## 2013-04-08 DIAGNOSIS — E7800 Pure hypercholesterolemia, unspecified: Secondary | ICD-10-CM | POA: Insufficient documentation

## 2013-04-08 DIAGNOSIS — F3289 Other specified depressive episodes: Secondary | ICD-10-CM | POA: Insufficient documentation

## 2013-04-08 DIAGNOSIS — S0181XA Laceration without foreign body of other part of head, initial encounter: Secondary | ICD-10-CM

## 2013-04-08 DIAGNOSIS — W010XXA Fall on same level from slipping, tripping and stumbling without subsequent striking against object, initial encounter: Secondary | ICD-10-CM | POA: Insufficient documentation

## 2013-04-08 DIAGNOSIS — D509 Iron deficiency anemia, unspecified: Secondary | ICD-10-CM | POA: Diagnosis not present

## 2013-04-08 DIAGNOSIS — F329 Major depressive disorder, single episode, unspecified: Secondary | ICD-10-CM | POA: Diagnosis not present

## 2013-04-08 DIAGNOSIS — I10 Essential (primary) hypertension: Secondary | ICD-10-CM | POA: Diagnosis not present

## 2013-04-08 DIAGNOSIS — S0990XA Unspecified injury of head, initial encounter: Secondary | ICD-10-CM | POA: Diagnosis not present

## 2013-04-08 DIAGNOSIS — Y9389 Activity, other specified: Secondary | ICD-10-CM | POA: Insufficient documentation

## 2013-04-08 DIAGNOSIS — Z79899 Other long term (current) drug therapy: Secondary | ICD-10-CM | POA: Diagnosis not present

## 2013-04-08 DIAGNOSIS — W19XXXA Unspecified fall, initial encounter: Secondary | ICD-10-CM

## 2013-04-08 DIAGNOSIS — K219 Gastro-esophageal reflux disease without esophagitis: Secondary | ICD-10-CM | POA: Diagnosis not present

## 2013-04-08 DIAGNOSIS — IMO0002 Reserved for concepts with insufficient information to code with codable children: Secondary | ICD-10-CM | POA: Insufficient documentation

## 2013-04-08 DIAGNOSIS — S0993XA Unspecified injury of face, initial encounter: Secondary | ICD-10-CM | POA: Diagnosis not present

## 2013-04-08 DIAGNOSIS — R51 Headache: Secondary | ICD-10-CM | POA: Diagnosis not present

## 2013-04-08 DIAGNOSIS — M542 Cervicalgia: Secondary | ICD-10-CM | POA: Diagnosis not present

## 2013-04-08 DIAGNOSIS — Y9289 Other specified places as the place of occurrence of the external cause: Secondary | ICD-10-CM | POA: Insufficient documentation

## 2013-04-08 NOTE — ED Notes (Signed)
Pt tripped on entryway at [facility].  Was seen by paramedics and chose to come pov to ED.  Lac to R forehead, approx 1 inch.  Bleeding controlled.  No loc.  Pt states pain to LEFT forehead immediately after fall, which has now subsided.  No loc.  Pt takes 1 81 mg asa daily.

## 2013-04-08 NOTE — ED Provider Notes (Signed)
CSN: [id number]     Arrival date & time 04/08/13  1335 History   First MD Initiated Contact with Patient 04/08/13 1410     Chief Complaint  Patient presents with  . Fall   (Consider location/radiation/quality/duration/timing/severity/associated sxs/prior Treatment) HPI Comments: Patient presents emergency department with chief complaint of head injury. Patient states that she was going to [facility] this morning, when she tripped at the entrance, and is sustained a mechanical fall. She reports striking her head on the ground. She states that she has a mild headache, but denies any other symptoms. She denies chest pain, shortness of breath, nausea, vomiting, diarrhea, or constipation. Denies any pain in the extremities. She does endorse some right-sided neck pain. She has not taken anything to alleviate her symptoms. She is not on any blood thinners, with the exception of aspirin 81 mg daily. She did not lose consciousness.  The history is provided by the patient. No language interpreter was used.    Past Medical History  Diagnosis Date  . Herpes ocular     history of  . GERD (gastroesophageal reflux disease)   . Depression   . Stenosing tenosynovitis of finger 08/2012    right index finger  . High cholesterol   . Hypertension     under control; has been on med. x 10 yr.  . History of hiatal hernia   . Osteoarthritis of knee     bilateral  . Anemia, iron deficiency   . Sciatic pain   . Dental crowns present    Past Surgical History  Procedure Laterality Date  . Lumbar laminectomy/decompression microdiscectomy  08/29/2010    L2-S1  . Shoulder arthroscopy with rotator cuff repair and subacromial decompression Right 01/01/2000  . Total knee arthroplasty Left 05/14/2004  . Abdominal hysterectomy  1995    partial  . Laparoscopic nissen fundoplication  07/03/2001  . Descemets stripping automated endothelial keratoplasty Left 03/14/2011  . Tonsillectomy      as child  . Colonoscopy    .  Total knee arthroplasty  12/23/2011    Procedure: TOTAL KNEE ARTHROPLASTY;  Surgeon: Nilda Simmer, MD;  Location: [facility] OR;  Service: Orthopedics;  Laterality: Right;  DR Thurston Hole WANTS 90 MINUTES FOR THIS CASE  . Knee arthroscopy Right 05/11/2001  . Trigger finger release Right 07/21/2007    thumb  . Carpal tunnel release Right 07/21/2007  . Trigger finger release Left 09/24/2007    middle finger  . Carpal tunnel release Left 09/24/2007  . Trigger finger release Right 03/10/2008    ring and little fingers  . Descemets stripping automated endothelial keratoplasty Right 08/20/2012  . Trigger finger release Right 09/02/2012    Procedure: RELEASE TRIGGER FINGER/A-1 PULLEY RIGHT INDEX FINGER;  Surgeon: Nicki Reaper, MD;  Location: [facility];  Service: Orthopedics;  Laterality: Right;  . Trigger finger release Left 12/22/2012    Procedure: RELEASE TRIGGER FINGER/A-1 PULLEY LEFT RING FINGER;  Surgeon: Nicki Reaper, MD;  Location: [facility];  Service: Orthopedics;  Laterality: Left;  Left    Family History  Problem Relation Age of Onset  . Heart attack Mother   . Parkinsonism Father   . Diabetes Brother    History  Substance Use Topics  . Smoking status: Former Games developer  . Smokeless tobacco: Never Used     Comment: quit smoking 20-30 yr. ago  . Alcohol Use: No   OB History   Grav Para Term Preterm Abortions TAB SAB Ect Mult  Living                 Review of Systems  All other systems reviewed and are negative.    Allergies  Morphine and related and Scopace  Home Medications   Current Outpatient Rx  Name  Route  Sig  Dispense  Refill  . aspirin 81 MG tablet   Oral   Take 81 mg by mouth daily.         . celecoxib (CELEBREX) 200 MG capsule   Oral   Take 200 mg by mouth 2 (two) times daily.         . cholecalciferol (VITAMIN D) 1000 UNITS tablet   Oral   Take 2,000 Units by mouth daily.         . Ferrous Gluconate (IRON) 240 (27 FE) MG TABS    Oral   Take 1 tablet by mouth 4 (four) times a week.          Marland Kitchen HYDROcodone-acetaminophen (NORCO) 5-325 MG per tablet   Oral   Take 1 tablet by mouth every 6 (six) hours as needed for pain.   30 tablet   0   . HYDROcodone-acetaminophen (NORCO) 5-325 MG per tablet   Oral   Take 1 tablet by mouth every 6 (six) hours as needed for pain.   30 tablet   0   . metoprolol (LOPRESSOR) 50 MG tablet   Oral   Take 25-50 mg by mouth 2 (two) times daily. Take 50 MG in the morning and take 25 MG at bedtime.         Marland Kitchen omeprazole (PRILOSEC) 40 MG capsule   Oral   Take 40 mg by mouth 2 (two) times daily.          . prednisoLONE acetate (PRED FORTE) 1 % ophthalmic suspension   Right Eye   Place 1 drop into the right eye 4 (four) times daily.          . rosuvastatin (CRESTOR) 10 MG tablet   Oral   Take 10 mg by mouth daily.         Marland Kitchen senna (SENOKOT) 8.6 MG TABS   Oral   Take 1 tablet by mouth.         . valACYclovir (VALTREX) 500 MG tablet   Oral   Take 800 mg by mouth daily.          Marland Kitchen venlafaxine XR (EFFEXOR-XR) 75 MG 24 hr capsule   Oral   Take 150 mg by mouth at bedtime.           BP 99/78  Pulse 67  Temp(Src) 98.5 F (36.9 C) (Oral)  Resp 16  Ht 5\' 1"  (1.549 m)  Wt 137 lb (62.143 kg)  BMI 25.9 kg/m2  SpO2 93% Physical Exam  Nursing note and vitals reviewed. Constitutional: She is oriented to person, place, and time. She appears well-developed and well-nourished.  HENT:  Head: Normocephalic and atraumatic.  No bony tenderness or abnormality  Eyes: Conjunctivae and EOM are normal. Pupils are equal, round, and reactive to light.  Neck: Normal range of motion. Neck supple.  Cardiovascular: Normal rate and regular rhythm.  Exam reveals no gallop and no friction rub.   No murmur heard. Pulmonary/Chest: Effort normal and breath sounds normal. No respiratory distress. She has no wheezes. She has no rales. She exhibits no tenderness.  Abdominal: Soft. Bowel  sounds are normal. She exhibits no distension and no mass. There is no  tenderness. There is no rebound and no guarding.  Musculoskeletal: Normal range of motion. She exhibits no edema and no tenderness.  Patient moves all extremities, 5 out of 5 range of motion and strength throughout, no other tenderness to palpation, CTLS spine is without step-offs, deformities, or other tenderness.  Mild right-sided cervical and thoracic paraspinal muscle tenderness  Neurological: She is alert and oriented to person, place, and time.  Skin: Skin is warm and dry.  3 cm laceration to the right forehead, bleeding is controlled  Psychiatric: She has a normal mood and affect. Her behavior is normal. Judgment and thought content normal.    ED Course  Procedures (including critical care time) Results for orders placed during the hospital encounter of 09/02/12  BASIC METABOLIC PANEL      Result Value Range   Sodium 136  135 - 145 mEq/L   Potassium 4.5  3.5 - 5.1 mEq/L   Chloride 100  96 - 112 mEq/L   CO2 27  19 - 32 mEq/L   Glucose, Bld 101 (*) 70 - 99 mg/dL   BUN 13  6 - 23 mg/dL   Creatinine, Ser 1.61  0.50 - 1.10 mg/dL   Calcium 9.2  8.4 - 09.6 mg/dL   GFR calc non Af Amer 84 (*) >90 mL/min   GFR calc Af Amer >90  >90 mL/min  POCT HEMOGLOBIN-HEMACUE      Result Value Range   Hemoglobin 13.6  12.0 - 15.0 g/dL   Ct Head Wo Contrast  04/08/2013   CLINICAL DATA:  79 year old female with fall, head and neck injury, headache, neck pain and right forehead laceration.  EXAM: CT HEAD WITHOUT CONTRAST  CT CERVICAL SPINE WITHOUT CONTRAST  TECHNIQUE: Multidetector CT imaging of the head and cervical spine was performed following the standard protocol without intravenous contrast. Multiplanar CT image reconstructions of the cervical spine were also generated.  COMPARISON:  None  FINDINGS: CT HEAD FINDINGS  Mild chronic small-vessel white matter ischemic changes are noted.  No acute intracranial abnormalities are  identified, including mass lesion or mass effect, hydrocephalus, extra-axial fluid collection, midline shift, hemorrhage, or acute infarction. The visualized bony calvarium is unremarkable.  Right forehead soft tissue swelling is identified.  CT CERVICAL SPINE FINDINGS  Reversal of the normal cervical lordosis noted.  There is no evidence of acute fracture, subluxation or prevertebral soft tissue swelling.  Multilevel degenerative disc disease and spondylosis identified, moderate to severe at C4-5 and moderate at C5-6. Mild facet arthropathy throughout the cervical spine is noted. These degenerative changes contribute to mild to moderate central spinal and bony biforaminal narrowing at C4-5.  No focal bony lesions are present.  IMPRESSION: No evidence of acute intracranial abnormality.  Right forehead soft tissue swelling without fracture.  No static evidence of acute injury to the cervical spine.  Multilevel degenerative changes, greatest at C4-5 with mild to moderate central spinal and bony foraminal narrowing at this level.   Electronically Signed   By: Laveda Abbe M.D.   On: 04/08/2013 15:09   Ct Cervical Spine Wo Contrast  04/08/2013   CLINICAL DATA:  79 year old female with fall, head and neck injury, headache, neck pain and right forehead laceration.  EXAM: CT HEAD WITHOUT CONTRAST  CT CERVICAL SPINE WITHOUT CONTRAST  TECHNIQUE: Multidetector CT imaging of the head and cervical spine was performed following the standard protocol without intravenous contrast. Multiplanar CT image reconstructions of the cervical spine were also generated.  COMPARISON:  None  FINDINGS:  CT HEAD FINDINGS  Mild chronic small-vessel white matter ischemic changes are noted.  No acute intracranial abnormalities are identified, including mass lesion or mass effect, hydrocephalus, extra-axial fluid collection, midline shift, hemorrhage, or acute infarction. The visualized bony calvarium is unremarkable.  Right forehead soft tissue  swelling is identified.  CT CERVICAL SPINE FINDINGS  Reversal of the normal cervical lordosis noted.  There is no evidence of acute fracture, subluxation or prevertebral soft tissue swelling.  Multilevel degenerative disc disease and spondylosis identified, moderate to severe at C4-5 and moderate at C5-6. Mild facet arthropathy throughout the cervical spine is noted. These degenerative changes contribute to mild to moderate central spinal and bony biforaminal narrowing at C4-5.  No focal bony lesions are present.  IMPRESSION: No evidence of acute intracranial abnormality.  Right forehead soft tissue swelling without fracture.  No static evidence of acute injury to the cervical spine.  Multilevel degenerative changes, greatest at C4-5 with mild to moderate central spinal and bony foraminal narrowing at this level.   Electronically Signed   By: Laveda Abbe M.D.   On: 04/08/2013 15:09    LACERATION REPAIR Performed by: Roxy Horseman Authorized by: Roxy Horseman Consent: Verbal consent obtained. Risks and benefits: risks, benefits and alternatives were discussed Consent given by: patient Patient identity confirmed: provided demographic data Prepped and Draped in normal sterile fashion Wound explored  Laceration Location: right forehead  Laceration Length: 3cm  No Foreign Bodies seen or palpated  Anesthesia: local infiltration  Local anesthetic: lidocaine 2% with epinephrine  Anesthetic total: 5 ml  Irrigation method: syringe Amount of cleaning: standard  Skin closure: 5-0 prolene  Number of sutures: 6  Technique: simple  Patient tolerance: Patient tolerated the procedure well with no immediate complications.   EKG Interpretation   None       MDM   1. Fall, initial encounter   2. Head injury, initial encounter   3. Forehead laceration, initial encounter     Patient with head injury, following mechanical fall. Will order CT head and cervical spine. Patient has a small  laceration of the forehead, which has been repaired in the emergency department. Patient is not anticoagulated. She denies any loss of consciousness. She is able to move all of her extremities. She is neurovascularly intact.  Images are reassuring. Patient is ambulatory. Patient seen by and discussed with Dr. Bebe Shaggy, who agrees with the plan.  Patient is stable and ready for discharge.    Roxy Horseman, PA-C 04/08/13 1550

## 2013-04-09 NOTE — ED Provider Notes (Signed)
Medical screening examination/treatment/procedure(s) were conducted as a shared visit with non-physician practitioner(s) and myself.  I personally evaluated the patient during the encounter  Pt stable, feels improved, she is ambulatory at her baseline, stable for d/c home  Joya Gaskins, MD 04/09/13 [phone]

## 2013-04-15 DIAGNOSIS — Z4802 Encounter for removal of sutures: Secondary | ICD-10-CM | POA: Diagnosis not present

## 2013-04-28 DIAGNOSIS — Z947 Corneal transplant status: Secondary | ICD-10-CM | POA: Diagnosis not present

## 2013-05-03 ENCOUNTER — Encounter: Payer: Self-pay | Admitting: Podiatry

## 2013-05-03 ENCOUNTER — Ambulatory Visit (INDEPENDENT_AMBULATORY_CARE_PROVIDER_SITE_OTHER): Payer: Medicare Other | Admitting: Podiatry

## 2013-05-03 VITALS — BP 117/62 | HR 68 | Resp 24 | Ht 62.0 in | Wt 135.0 lb

## 2013-05-03 DIAGNOSIS — L600 Ingrowing nail: Secondary | ICD-10-CM | POA: Diagnosis not present

## 2013-05-03 NOTE — Progress Notes (Signed)
  Subjective:    Patient ID: Kristin Solomon, female    DOB: May 08, 1930, 79 y.o.   MRN: [id number] "I've got an ingrown toenail on the right foot, toe next to the big toe."   HPI Comments: N  Tender, sore L  Ingrown toenail 2nd rt D Couple years O Off and on C  Slowly gotten worse A  Pressure T  Tried to cut it out.      Review of Systems  Constitutional: Negative.   HENT: Negative.   Eyes: Negative.   Respiratory: Negative.   Cardiovascular: Negative.   Gastrointestinal: Negative.   Endocrine: Negative.   Genitourinary: Negative.   Musculoskeletal: Positive for arthralgias.  Skin: Negative.   Allergic/Immunologic: Negative.   Neurological: Negative.   Hematological: Negative.   Psychiatric/Behavioral: Negative.        Objective:   Physical Exam Orientated x53 79 year old white female  Vascular: DP and PT pulses are two over four bilaterally. Capillary fill is immediate bilaterally.  Neurological: Knee and ankle reflexes are reactive bilaterally.  Dermatological: Mildly atrophic skin noted bilaterally. The medial border of the second right toenail is incurvated with the callus nail groove. Palpation on the medial border on the second right toe duplicates patient's discomfort.  Musculoskeletal: Medial drifting of the second right toe noted.          Assessment & Plan:   Assessment: Ingrowing medial border of the second right toenail  Plan: Offered patient treatment options including repetitive debridement or permanent removal. She opts for permanent removal. The second right toe was then blocked with 2 cc of 50-50 mixture of 2% plain Xylocaine and 0.5% plain Marcaine. The toe was prepped with Betadine and exsanguinated. The medial border of the second right toenail was excised and a phenol matricectomy performed. An antibiotic dressing applied. The tourniquet was released and spontaneous capillary filling times noted to the second right toe. Postoperative oral  and written instructions provided. Recommended acetaminophen for pain control.  Reappoint at patient has the future concerns.  Richard C.Leeanne Deed, DPM

## 2013-05-03 NOTE — Patient Instructions (Signed)

## 2013-05-26 DIAGNOSIS — Z947 Corneal transplant status: Secondary | ICD-10-CM | POA: Diagnosis not present

## 2013-06-12 ENCOUNTER — Inpatient Hospital Stay (HOSPITAL_COMMUNITY)
Admission: EM | Admit: 2013-06-12 | Discharge: 2013-06-16 | DRG: 470 | Disposition: A | Discharge status: Skilled Nursing Facility | Payer: Medicare Other | Attending: Internal Medicine | Admitting: Internal Medicine

## 2013-06-12 ENCOUNTER — Emergency Department (HOSPITAL_COMMUNITY): Payer: Medicare Other

## 2013-06-12 ENCOUNTER — Encounter (HOSPITAL_COMMUNITY): Payer: Self-pay | Admitting: Emergency Medicine

## 2013-06-12 DIAGNOSIS — IMO0002 Reserved for concepts with insufficient information to code with codable children: Secondary | ICD-10-CM

## 2013-06-12 DIAGNOSIS — E739 Lactose intolerance, unspecified: Secondary | ICD-10-CM | POA: Diagnosis present

## 2013-06-12 DIAGNOSIS — S199XXA Unspecified injury of neck, initial encounter: Secondary | ICD-10-CM | POA: Diagnosis not present

## 2013-06-12 DIAGNOSIS — E785 Hyperlipidemia, unspecified: Secondary | ICD-10-CM | POA: Diagnosis not present

## 2013-06-12 DIAGNOSIS — I4949 Other premature depolarization: Secondary | ICD-10-CM | POA: Diagnosis present

## 2013-06-12 DIAGNOSIS — M6281 Muscle weakness (generalized): Secondary | ICD-10-CM | POA: Diagnosis not present

## 2013-06-12 DIAGNOSIS — S72009A Fracture of unspecified part of neck of unspecified femur, initial encounter for closed fracture: Secondary | ICD-10-CM | POA: Diagnosis not present

## 2013-06-12 DIAGNOSIS — Z87891 Personal history of nicotine dependence: Secondary | ICD-10-CM | POA: Diagnosis not present

## 2013-06-12 DIAGNOSIS — R079 Chest pain, unspecified: Secondary | ICD-10-CM | POA: Diagnosis not present

## 2013-06-12 DIAGNOSIS — I10 Essential (primary) hypertension: Secondary | ICD-10-CM | POA: Diagnosis not present

## 2013-06-12 DIAGNOSIS — M1710 Unilateral primary osteoarthritis, unspecified knee: Secondary | ICD-10-CM | POA: Diagnosis present

## 2013-06-12 DIAGNOSIS — M25559 Pain in unspecified hip: Secondary | ICD-10-CM | POA: Diagnosis not present

## 2013-06-12 DIAGNOSIS — Z9181 History of falling: Secondary | ICD-10-CM | POA: Diagnosis not present

## 2013-06-12 DIAGNOSIS — D62 Acute posthemorrhagic anemia: Secondary | ICD-10-CM | POA: Diagnosis not present

## 2013-06-12 DIAGNOSIS — D72829 Elevated white blood cell count, unspecified: Secondary | ICD-10-CM | POA: Diagnosis present

## 2013-06-12 DIAGNOSIS — E782 Mixed hyperlipidemia: Secondary | ICD-10-CM | POA: Diagnosis not present

## 2013-06-12 DIAGNOSIS — I493 Ventricular premature depolarization: Secondary | ICD-10-CM

## 2013-06-12 DIAGNOSIS — S72033A Displaced midcervical fracture of unspecified femur, initial encounter for closed fracture: Secondary | ICD-10-CM | POA: Diagnosis not present

## 2013-06-12 DIAGNOSIS — S0990XA Unspecified injury of head, initial encounter: Secondary | ICD-10-CM | POA: Diagnosis not present

## 2013-06-12 DIAGNOSIS — S298XXA Other specified injuries of thorax, initial encounter: Secondary | ICD-10-CM | POA: Diagnosis not present

## 2013-06-12 DIAGNOSIS — Z7982 Long term (current) use of aspirin: Secondary | ICD-10-CM

## 2013-06-12 DIAGNOSIS — Z23 Encounter for immunization: Secondary | ICD-10-CM

## 2013-06-12 DIAGNOSIS — S72002A Fracture of unspecified part of neck of left femur, initial encounter for closed fracture: Secondary | ICD-10-CM | POA: Insufficient documentation

## 2013-06-12 DIAGNOSIS — F329 Major depressive disorder, single episode, unspecified: Secondary | ICD-10-CM | POA: Diagnosis present

## 2013-06-12 DIAGNOSIS — Z96659 Presence of unspecified artificial knee joint: Secondary | ICD-10-CM | POA: Diagnosis not present

## 2013-06-12 DIAGNOSIS — D509 Iron deficiency anemia, unspecified: Secondary | ICD-10-CM | POA: Diagnosis present

## 2013-06-12 DIAGNOSIS — Z79899 Other long term (current) drug therapy: Secondary | ICD-10-CM

## 2013-06-12 DIAGNOSIS — Z9889 Other specified postprocedural states: Secondary | ICD-10-CM | POA: Diagnosis present

## 2013-06-12 DIAGNOSIS — W010XXA Fall on same level from slipping, tripping and stumbling without subsequent striking against object, initial encounter: Secondary | ICD-10-CM | POA: Diagnosis present

## 2013-06-12 DIAGNOSIS — M5416 Radiculopathy, lumbar region: Secondary | ICD-10-CM

## 2013-06-12 DIAGNOSIS — Y9229 Other specified public building as the place of occurrence of the external cause: Secondary | ICD-10-CM

## 2013-06-12 DIAGNOSIS — F3289 Other specified depressive episodes: Secondary | ICD-10-CM | POA: Diagnosis present

## 2013-06-12 DIAGNOSIS — M79609 Pain in unspecified limb: Secondary | ICD-10-CM | POA: Diagnosis not present

## 2013-06-12 DIAGNOSIS — D649 Anemia, unspecified: Secondary | ICD-10-CM | POA: Diagnosis not present

## 2013-06-12 DIAGNOSIS — Z96649 Presence of unspecified artificial hip joint: Secondary | ICD-10-CM | POA: Diagnosis not present

## 2013-06-12 DIAGNOSIS — R262 Difficulty in walking, not elsewhere classified: Secondary | ICD-10-CM | POA: Diagnosis not present

## 2013-06-12 DIAGNOSIS — S4980XA Other specified injuries of shoulder and upper arm, unspecified arm, initial encounter: Secondary | ICD-10-CM | POA: Diagnosis not present

## 2013-06-12 DIAGNOSIS — K219 Gastro-esophageal reflux disease without esophagitis: Secondary | ICD-10-CM | POA: Diagnosis not present

## 2013-06-12 DIAGNOSIS — S46909A Unspecified injury of unspecified muscle, fascia and tendon at shoulder and upper arm level, unspecified arm, initial encounter: Secondary | ICD-10-CM | POA: Diagnosis not present

## 2013-06-12 DIAGNOSIS — S0993XA Unspecified injury of face, initial encounter: Secondary | ICD-10-CM | POA: Diagnosis not present

## 2013-06-12 DIAGNOSIS — M25519 Pain in unspecified shoulder: Secondary | ICD-10-CM | POA: Diagnosis not present

## 2013-06-12 DIAGNOSIS — Z471 Aftercare following joint replacement surgery: Secondary | ICD-10-CM | POA: Diagnosis not present

## 2013-06-12 LAB — COMPREHENSIVE METABOLIC PANEL
AST: 25 U/L (ref 0–37)
Albumin: 4 g/dL (ref 3.5–5.2)
Alkaline Phosphatase: 73 U/L (ref 39–117)
BUN: 16 mg/dL (ref 6–23)
CO2: 26 mEq/L (ref 19–32)
Chloride: 100 mEq/L (ref 96–112)
Creatinine, Ser: 0.71 mg/dL (ref 0.50–1.10)
Potassium: 3.7 mEq/L (ref 3.5–5.1)
Total Bilirubin: 0.3 mg/dL (ref 0.3–1.2)
Total Protein: 7.1 g/dL (ref 6.0–8.3)

## 2013-06-12 LAB — CBC WITH DIFFERENTIAL/PLATELET
Basophils Absolute: 0 10*3/uL (ref 0.0–0.1)
Basophils Relative: 0 % (ref 0–1)
Hemoglobin: 13 g/dL (ref 12.0–15.0)
MCHC: 34.6 g/dL (ref 30.0–36.0)
Monocytes Relative: 9 % (ref 3–12)
Neutro Abs: 8.6 10*3/uL — ABNORMAL HIGH (ref 1.7–7.7)
Neutrophils Relative %: 72 % (ref 43–77)
RDW: 12.5 % (ref 11.5–15.5)

## 2013-06-12 LAB — URINALYSIS, ROUTINE W REFLEX MICROSCOPIC
Bilirubin Urine: NEGATIVE
Glucose, UA: NEGATIVE mg/dL
Ketones, ur: NEGATIVE mg/dL
Leukocytes, UA: NEGATIVE
Nitrite: NEGATIVE
Specific Gravity, Urine: 1.009 (ref 1.005–1.030)
pH: 7 (ref 5.0–8.0)

## 2013-06-12 MED ORDER — ONDANSETRON HCL 4 MG/2ML IJ SOLN: 4.0000 mg | Freq: Three times a day (TID) | INTRAMUSCULAR | Status: DC | PRN

## 2013-06-12 MED ORDER — HYDROMORPHONE HCL PF 1 MG/ML IJ SOLN: 0.5000 mg | INTRAMUSCULAR | Status: DC | PRN

## 2013-06-12 MED ORDER — SODIUM CHLORIDE 0.9 % IV SOLN: INTRAVENOUS | Status: DC

## 2013-06-12 MED ORDER — HYDROMORPHONE HCL PF 1 MG/ML IJ SOLN
0.5000 mg | Freq: Once | INTRAMUSCULAR | Status: AC
  Administered 2013-06-12: 0.5 mg via INTRAVENOUS

## 2013-06-12 MED ORDER — ZOLPIDEM TARTRATE 5 MG PO TABS: 5.0000 mg | ORAL_TABLET | Freq: Every evening | ORAL | Status: DC | PRN

## 2013-06-12 MED ORDER — OXYCODONE HCL 5 MG PO TABS: 5.0000 mg | ORAL_TABLET | ORAL | Status: DC | PRN

## 2013-06-12 MED ORDER — SODIUM CHLORIDE 0.9 % IV SOLN: Freq: Once | INTRAVENOUS | Status: DC

## 2013-06-12 MED ORDER — ONDANSETRON HCL 4 MG/2ML IJ SOLN
4.0000 mg | Freq: Once | INTRAMUSCULAR | Status: AC
  Administered 2013-06-12: 4 mg via INTRAVENOUS
  Filled 2013-06-12: qty 2

## 2013-06-12 MED ORDER — ONDANSETRON HCL 4 MG/2ML IJ SOLN: 4.0000 mg | Freq: Four times a day (QID) | INTRAMUSCULAR | Status: DC | PRN

## 2013-06-12 MED ORDER — SODIUM CHLORIDE 0.9 % IV BOLUS (SEPSIS)
250.0000 mL | Freq: Once | INTRAVENOUS | Status: AC
  Administered 2013-06-12: 250 mL via INTRAVENOUS

## 2013-06-12 MED ORDER — ALUM & MAG HYDROXIDE-SIMETH 200-200-20 MG/5ML PO SUSP: 30.0000 mL | Freq: Four times a day (QID) | ORAL | Status: DC | PRN

## 2013-06-12 MED ORDER — ONDANSETRON HCL 4 MG PO TABS: 4.0000 mg | ORAL_TABLET | Freq: Four times a day (QID) | ORAL | Status: DC | PRN

## 2013-06-12 MED ORDER — CEFAZOLIN SODIUM-DEXTROSE 2-3 GM-% IV SOLR
2.0000 g | INTRAVENOUS | Status: AC
  Administered 2013-06-13: 2 g via INTRAVENOUS
  Filled 2013-06-12: qty 50

## 2013-06-12 MED ORDER — HYDROMORPHONE HCL PF 1 MG/ML IJ SOLN
0.5000 mg | Freq: Once | INTRAMUSCULAR | Status: AC
  Administered 2013-06-12: 0.5 mg via INTRAVENOUS
  Filled 2013-06-12: qty 1

## 2013-06-12 MED ORDER — ACETAMINOPHEN 325 MG PO TABS: 650.0000 mg | ORAL_TABLET | Freq: Four times a day (QID) | ORAL | Status: DC | PRN

## 2013-06-12 MED ORDER — HYDROMORPHONE HCL PF 1 MG/ML IJ SOLN: 1.0000 mg | INTRAMUSCULAR | Status: DC | PRN

## 2013-06-12 MED ORDER — HYDROCODONE-ACETAMINOPHEN 10-325 MG PO TABS
1.0000 | ORAL_TABLET | Freq: Four times a day (QID) | ORAL | Status: DC | PRN
  Administered 2013-06-12 – 2013-06-16 (×10): 1 via ORAL
  Filled 2013-06-12 (×3): qty 1
  Filled 2013-06-12: qty 2
  Filled 2013-06-12 (×6): qty 1

## 2013-06-12 MED ORDER — ACETAMINOPHEN 650 MG RE SUPP: 650.0000 mg | Freq: Four times a day (QID) | RECTAL | Status: DC | PRN

## 2013-06-12 NOTE — Consult Note (Signed)
ORTHOPAEDIC CONSULTATION  REQUESTING PHYSICIAN: Shelda Jakes, MD  Chief Complaint: Left hip pain  HPI: Kristin Solomon is a 79 y.o. female who complains of  left hip pain after a mechanical fall at [employer] today. She is using her cane, and thinks that the cane slipped, fell down, injured her left hip, acute severe pain, unable to walk, diagnosed with a hip fracture by x-ray in the emergency room. Orthopedic consultation requested. She denies other injuries. Activity makes it worse, and a lot it makes it better. She is able to tolerate the Dilaudid, although does not tolerate morphine.  Past Medical History  Diagnosis Date  . Herpes ocular     history of  . GERD (gastroesophageal reflux disease)   . Depression   . Stenosing tenosynovitis of finger 08/2012    right index finger  . High cholesterol   . Hypertension     under control; has been on med. x 10 yr.  . History of hiatal hernia   . Osteoarthritis of knee     bilateral  . Anemia, iron deficiency   . Sciatic pain   . Dental crowns present   . Fracture of femoral neck, left 06/12/2013   Past Surgical History  Procedure Laterality Date  . Lumbar laminectomy/decompression microdiscectomy  08/29/2010    L2-S1  . Shoulder arthroscopy with rotator cuff repair and subacromial decompression Right 01/01/2000  . Total knee arthroplasty Left 05/14/2004  . Abdominal hysterectomy  1995    partial  . Laparoscopic nissen fundoplication  07/03/2001  . Descemets stripping automated endothelial keratoplasty Left 03/14/2011  . Tonsillectomy      as child  . Colonoscopy    . Total knee arthroplasty  12/23/2011    Procedure: TOTAL KNEE ARTHROPLASTY;  Surgeon: Nilda Simmer, MD;  Location: [facility] OR;  Service: Orthopedics;  Laterality: Right;  DR Thurston Hole WANTS 90 MINUTES FOR THIS CASE  . Knee arthroscopy Right 05/11/2001  . Trigger finger release Right 07/21/2007    thumb  . Carpal tunnel release Right 07/21/2007  . Trigger finger release  Left 09/24/2007    middle finger  . Carpal tunnel release Left 09/24/2007  . Trigger finger release Right 03/10/2008    ring and little fingers  . Descemets stripping automated endothelial keratoplasty Right 08/20/2012  . Trigger finger release Right 09/02/2012    Procedure: RELEASE TRIGGER FINGER/A-1 PULLEY RIGHT INDEX FINGER;  Surgeon: Nicki Reaper, MD;  Location: [facility];  Service: Orthopedics;  Laterality: Right;  . Trigger finger release Left 12/22/2012    Procedure: RELEASE TRIGGER FINGER/A-1 PULLEY LEFT RING FINGER;  Surgeon: Nicki Reaper, MD;  Location:  SURGERY CENTER;  Service: Orthopedics;  Laterality: Left;  Left    History   Social History  . Marital Status: Married    Spouse Name: N/A    Number of Children: N/A  . Years of Education: N/A   Social History Main Topics  . Smoking status: Former Games developer  . Smokeless tobacco: Never Used     Comment: quit smoking 20-30 yr. ago  . Alcohol Use: No  . Drug Use: No  . Sexual Activity: Not Currently   Other Topics Concern  . None   Social History Narrative  . None   Family History  Problem Relation Age of Onset  . Heart attack Mother   . Parkinsonism Father   . Diabetes Brother    Allergies  Allergen Reactions  . Morphine And Related Other (See Comments)  AGITATION, STRANGE DREAMS  . Scopace [Scopolamine] Other (See Comments)    MENTAL CHANGES   Prior to Admission medications   Medication Sig Start Date End Date Taking? Authorizing Provider  acetaminophen (TYLENOL) 500 MG tablet Take 500 mg by mouth every 6 (six) hours as needed for pain.   Yes Historical Provider, MD  ALPRAZolam Prudy Feeler) 0.5 MG tablet Take 0.25 mg by mouth at bedtime as needed for sleep.   Yes Historical Provider, MD  aspirin 81 MG tablet Take 81 mg by mouth daily.   Yes Historical Provider, MD  cholecalciferol (VITAMIN D) 1000 UNITS tablet Take 2,000 Units by mouth daily.   Yes Historical Provider, MD  Ferrous Gluconate  (IRON) 240 (27 FE) MG TABS Take 1 tablet by mouth daily.    Yes Historical Provider, MD  metoprolol (LOPRESSOR) 50 MG tablet Take 25-50 mg by mouth 2 (two) times daily. Take 50 MG in the morning and take 25 MG at bedtime.   Yes Historical Provider, MD  omeprazole (PRILOSEC) 40 MG capsule Take 40 mg by mouth 2 (two) times daily.    Yes Historical Provider, MD  rosuvastatin (CRESTOR) 10 MG tablet Take 10 mg by mouth daily.   Yes Historical Provider, MD  senna (SENOKOT) 8.6 MG TABS Take 1 tablet by mouth at bedtime as needed for mild constipation.    Yes Historical Provider, MD  valACYclovir (VALTREX) 500 MG tablet Take 1,000 mg by mouth at bedtime.    Yes Historical Provider, MD  venlafaxine XR (EFFEXOR-XR) 75 MG 24 hr capsule Take 75 mg by mouth at bedtime.    Yes Historical Provider, MD   Dg Chest 1 View  06/12/2013   CLINICAL DATA:  History of fall complaining of left sided pain.  EXAM: CHEST - 1 VIEW  COMPARISON:  Chest x-ray 12/17/2011.  FINDINGS: Lung volumes are normal. No consolidative airspace disease. No pleural effusions. Linear opacities in the base of the left hemithorax are likely to reflect areas of subsegmental atelectasis in the left lower lobe. No acute consolidative airspace disease. No pleural effusions. Surgical clips are seen projecting over the lower mediastinum, likely near the gastroesophageal junction. Heart size and mediastinal contours are within normal limits.  IMPRESSION: 1. Probable subsegmental atelectasis in the left lower lobe. In the setting of recent trauma with chest pain, this may be related to splinting.   Electronically Signed   By: Trudie Reed M.D.   On: 06/12/2013 17:49   Dg Hip Bilateral W/pelvis  06/12/2013   CLINICAL DATA:  History of fall complaining of pelvic pain, particularly in the left hip.  EXAM: BILATERAL HIP WITH PELVIS - 4+ VIEW  COMPARISON:  No priors.  FINDINGS: Multiple views of the bony pelvis and left hip demonstrate an acute mildly  displaced transcervical neck fracture of the left hip. The left femoral head remains properly located. There is approximately 4 mm of lateral displacement of the distal femoral fragment. No acute displaced fracture of the bony pelvic ring or right hip is noted.  IMPRESSION: 1. Acute mildly displaced transcervical femoral neck fracture of the left hip.   Electronically Signed   By: Trudie Reed M.D.   On: 06/12/2013 17:46   Ct Head Wo Contrast  06/12/2013   CLINICAL DATA:  History of trauma from a fall.  EXAM: CT HEAD WITHOUT CONTRAST  CT CERVICAL SPINE WITHOUT CONTRAST  TECHNIQUE: Multidetector CT imaging of the head and cervical spine was performed following the standard protocol without intravenous contrast. Multiplanar  CT image reconstructions of the cervical spine were also generated.  COMPARISON:  Head and cervical spine CT scan 04/08/2013.  FINDINGS: CT HEAD FINDINGS  Mild cerebral atrophy. Patchy and confluent areas of decreased attenuation are noted throughout the deep and periventricular white matter of the cerebral hemispheres bilaterally, compatible with chronic microvascular ischemic disease. No acute displaced skull fractures are identified. No acute intracranial abnormality. Specifically, no evidence of acute post-traumatic intracranial hemorrhage, no definite regions of acute/subacute cerebral ischemia, no focal mass, mass effect, hydrocephalus or abnormal intra or extra-axial fluid collections. The visualized paranasal sinuses and mastoids are well pneumatized.  CT CERVICAL SPINE FINDINGS  No acute displaced fracture of the cervical spine. Reversal of normal cervical lordosis centered at the level of C3-C4. This is similar to prior study 04/08/2013, and presumably related to chronic degenerative disease. Alignment is otherwise anatomic. Multilevel degenerative disc disease is noted, most severe at C4-C5. Mild multilevel facet arthropathy is also noted. Prevertebral soft tissues are normal.  Visualized portions of the upper thorax are unremarkable.  IMPRESSION: 1. No evidence of significant acute traumatic injury to the head, brain or cervical spine. 2. Mild cerebral atrophy with chronic microvascular ischemic changes in the cerebral white matter. 3. Multilevel degenerative disc disease and cervical spondylosis redemonstrated, as above.   Electronically Signed   By: Trudie Reed M.D.   On: 06/12/2013 17:16   Ct Cervical Spine Wo Contrast  06/12/2013   CLINICAL DATA:  History of trauma from a fall.  EXAM: CT HEAD WITHOUT CONTRAST  CT CERVICAL SPINE WITHOUT CONTRAST  TECHNIQUE: Multidetector CT imaging of the head and cervical spine was performed following the standard protocol without intravenous contrast. Multiplanar CT image reconstructions of the cervical spine were also generated.  COMPARISON:  Head and cervical spine CT scan 04/08/2013.  FINDINGS: CT HEAD FINDINGS  Mild cerebral atrophy. Patchy and confluent areas of decreased attenuation are noted throughout the deep and periventricular white matter of the cerebral hemispheres bilaterally, compatible with chronic microvascular ischemic disease. No acute displaced skull fractures are identified. No acute intracranial abnormality. Specifically, no evidence of acute post-traumatic intracranial hemorrhage, no definite regions of acute/subacute cerebral ischemia, no focal mass, mass effect, hydrocephalus or abnormal intra or extra-axial fluid collections. The visualized paranasal sinuses and mastoids are well pneumatized.  CT CERVICAL SPINE FINDINGS  No acute displaced fracture of the cervical spine. Reversal of normal cervical lordosis centered at the level of C3-C4. This is similar to prior study 04/08/2013, and presumably related to chronic degenerative disease. Alignment is otherwise anatomic. Multilevel degenerative disc disease is noted, most severe at C4-C5. Mild multilevel facet arthropathy is also noted. Prevertebral soft tissues are  normal. Visualized portions of the upper thorax are unremarkable.  IMPRESSION: 1. No evidence of significant acute traumatic injury to the head, brain or cervical spine. 2. Mild cerebral atrophy with chronic microvascular ischemic changes in the cerebral white matter. 3. Multilevel degenerative disc disease and cervical spondylosis redemonstrated, as above.   Electronically Signed   By: Trudie Reed M.D.   On: 06/12/2013 17:16   Dg Shoulder Left  06/12/2013   CLINICAL DATA:  History of fall complaining of left shoulder pain.  EXAM: LEFT SHOULDER - 2+ VIEW  COMPARISON:  No priors.  FINDINGS: Three views of the left shoulder demonstrate no acute displaced fracture or dislocation. The humeral head is high riding, likely related to underlying rotator cuff pathology. Degenerative changes of osteoarthritis are noted in the glenohumeral and acromioclavicular joints.  IMPRESSION: 1.  No acute radiographic abnormality of the left shoulder. 2. Findings compatible with rotator cuff disease and osteoarthritis in the glenohumeral and acromioclavicular joints.   Electronically Signed   By: Trudie Reed M.D.   On: 06/12/2013 17:47    Positive ROS: All other systems have been reviewed and were otherwise negative with the exception of those mentioned in the HPI and as above.  Physical Exam: General: Alert, no acute distress Cardiovascular: No pedal edema Respiratory: No cyanosis, no use of accessory musculature GI: No organomegaly, abdomen is soft and non-tender Skin: No lesions in the area of chief complaint Neurologic: Sensation intact distally Psychiatric: Patient is competent for consent with normal mood and affect, her daughter is at the bedside, and also reviewed the consent process.  Lymphatic: No axillary or cervical lymphadenopathy  MUSCULOSKELETAL: Left hip has a positive log roll, unable to lift the leg off the bed, EHL and FHL are firing. Calves are soft.  Assessment: Displaced varus left  femoral neck fracture  Plan: This is an acute severe injury, and carries risks of both for morbidity and mortality. I recommended surgical management with hemiarthroplasty. I don't think that percutaneous pinning would provide a predictable result given the varus position of the fracture.  The risks benefits and alternatives were discussed with the patient including but not limited to the risks of nonoperative treatment, versus surgical intervention including infection, bleeding, nerve injury, periprosthetic fracture, the need for revision surgery, dislocation, leg length discrepancy, blood clots, cardiopulmonary complications, morbidity, mortality, among others, and they were willing to proceed.    We will await medical evaluation, and admission, per the hip fracture pathway, and I have ordered her a regular diet tonight,., n.p.o. after midnight, and hydrocodone which she used after her knee replacement, assuming she is optimized, we will plan for surgery in the morning. She also needs to have blood available due to her anemia, and is very likely to require blood transfusion during this hospitalization.    Eulas Post, MD Cell [phone]   06/12/2013 7:17 PM

## 2013-06-12 NOTE — ED Notes (Signed)
Ortho MD at bedside.

## 2013-06-12 NOTE — ED Notes (Signed)
Patient transported to X-ray 7 CT scan

## 2013-06-12 NOTE — H&P (Signed)
[facility] History and Physical  Kristin Solomon WRU:[id number] DOB: April 24, 1930 DOA: 06/12/2013  Referring physician:  PCP: Pearla Dubonnet, MD  Specialists:   Chief Complaint:   HPI: Kristin Solomon is a 79 y.o. female who was out shopping in the afternoon and her cane slipped and she fell onto her left side.  She had increased pain in her left hip and was brought to the ED and was found to have a closed fracture of the left femoral neck.   She was seen by Orthopedics Dr.  Dion Saucier in the ED and plans have been made for surgery in the AM.       Review of Systems: The patient denies anorexia, fever, chills, headaches, weight loss,, vision loss, diplopia, dizziness, decreased hearing, rhinitis, hoarseness, chest pain, syncope, dyspnea on exertion, peripheral edema, balance deficits, cough, hemoptysis, abdominal pain, nausea, vomiting, diarrhea, constipation, hematemesis, melena, hematochezia, severe indigestion/heartburn, dysuria, hematuria, incontinence, muscle weakness, suspicious skin lesions, transient blindness, depression, unusual weight change, abnormal bleeding, enlarged lymph nodes, angioedema, and breast masses.    Past Medical History  Diagnosis Date  . Herpes ocular     history of  . GERD (gastroesophageal reflux disease)   . Depression   . Stenosing tenosynovitis of finger 08/2012    right index finger  . High cholesterol   . Hypertension     under control; has been on med. x 10 yr.  . History of hiatal hernia   . Osteoarthritis of knee     bilateral  . Anemia, iron deficiency   . Sciatic pain   . Dental crowns present   . Fracture of femoral neck, left 06/12/2013    Past Surgical History  Procedure Laterality Date  . Lumbar laminectomy/decompression microdiscectomy  08/29/2010    L2-S1  . Shoulder arthroscopy with rotator cuff repair and subacromial decompression Right 01/01/2000  . Total knee arthroplasty Left 05/14/2004  . Abdominal hysterectomy   1995    partial  . Laparoscopic nissen fundoplication  07/03/2001  . Descemets stripping automated endothelial keratoplasty Left 03/14/2011  . Tonsillectomy      as child  . Colonoscopy    . Total knee arthroplasty  12/23/2011    Procedure: TOTAL KNEE ARTHROPLASTY;  Surgeon: Nilda Simmer, MD;  Location: [facility] OR;  Service: Orthopedics;  Laterality: Right;  DR Thurston Hole WANTS 90 MINUTES FOR THIS CASE  . Knee arthroscopy Right 05/11/2001  . Trigger finger release Right 07/21/2007    thumb  . Carpal tunnel release Right 07/21/2007  . Trigger finger release Left 09/24/2007    middle finger  . Carpal tunnel release Left 09/24/2007  . Trigger finger release Right 03/10/2008    ring and little fingers  . Descemets stripping automated endothelial keratoplasty Right 08/20/2012  . Trigger finger release Right 09/02/2012    Procedure: RELEASE TRIGGER FINGER/A-1 PULLEY RIGHT INDEX FINGER;  Surgeon: Nicki Reaper, MD;  Location: [facility];  Service: Orthopedics;  Laterality: Right;  . Trigger finger release Left 12/22/2012    Procedure: RELEASE TRIGGER FINGER/A-1 PULLEY LEFT RING FINGER;  Surgeon: Nicki Reaper, MD;  Location: [facility];  Service: Orthopedics;  Laterality: Left;  Left     Prior to Admission medications   Medication Sig Start Date End Date Taking? Authorizing Provider  acetaminophen (TYLENOL) 500 MG tablet Take 500 mg by mouth every 6 (six) hours as needed for pain.   Yes Historical Provider, MD  ALPRAZolam Prudy Feeler) 0.5 MG tablet Take 0.25  mg by mouth at bedtime as needed for sleep.   Yes Historical Provider, MD  aspirin 81 MG tablet Take 81 mg by mouth daily.   Yes Historical Provider, MD  cholecalciferol (VITAMIN D) 1000 UNITS tablet Take 2,000 Units by mouth daily.   Yes Historical Provider, MD  Ferrous Gluconate (IRON) 240 (27 FE) MG TABS Take 1 tablet by mouth daily.    Yes Historical Provider, MD  metoprolol (LOPRESSOR) 50 MG tablet Take 25-50 mg by mouth 2 (two)  times daily. Take 50 MG in the morning and take 25 MG at bedtime.   Yes Historical Provider, MD  omeprazole (PRILOSEC) 40 MG capsule Take 40 mg by mouth 2 (two) times daily.    Yes Historical Provider, MD  rosuvastatin (CRESTOR) 10 MG tablet Take 10 mg by mouth daily.   Yes Historical Provider, MD  senna (SENOKOT) 8.6 MG TABS Take 1 tablet by mouth at bedtime as needed for mild constipation.    Yes Historical Provider, MD  valACYclovir (VALTREX) 500 MG tablet Take 1,000 mg by mouth at bedtime.    Yes Historical Provider, MD  venlafaxine XR (EFFEXOR-XR) 75 MG 24 hr capsule Take 75 mg by mouth at bedtime.    Yes Historical Provider, MD    Allergies  Allergen Reactions  . Morphine And Related Other (See Comments)    AGITATION, STRANGE DREAMS  . Scopace [Scopolamine] Other (See Comments)    MENTAL CHANGES    Social History:  reports that she has quit smoking. She has never used smokeless tobacco. She reports that she does not drink alcohol or use illicit drugs.     Family History  Problem Relation Age of Onset  . Heart attack Mother   . Parkinsonism Father   . Diabetes Brother       Physical Exam:  GEN:  Pleasant  Elderly  79 y.o. Caucasian female  examined  and in no acute distress; cooperative with exam Filed Vitals:   06/12/13 1930 06/12/13 2030 06/12/13 2100 06/12/13 2149  BP: 145/93 114/67 105/59 119/51  Pulse: 90 92 94 96  Temp:    99 F (37.2 C)  TempSrc:    Oral  Resp: 14 19 17 16   Height:    5\' 2"  (1.575 m)  Weight:    60.5 kg (133 lb 6.1 oz)  SpO2: 100% 97% 99% 91%   Blood pressure 119/51, pulse 96, temperature 99 F (37.2 C), temperature source Oral, resp. rate 16, height 5\' 2"  (1.575 m), weight 60.5 kg (133 lb 6.1 oz), SpO2 91.00%. PSYCH: She is alert and oriented x4; does not appear anxious does not appear depressed; affect is normal HEENT: Normocephalic and Atraumatic, Mucous membranes pink; PERRLA; EOM intact; Fundi:  Benign;  No scleral icterus, Nares: Patent,  Oropharynx: Clear,  Fair Dentition, Neck:  FROM, no cervical lymphadenopathy nor thyromegaly or carotid bruit; no JVD; Breasts:: Not examined CHEST WALL: No tenderness CHEST: Normal respiration, clear to auscultation bilaterally HEART: Regular rate and rhythm; no murmurs rubs or gallops BACK: No kyphosis or scoliosis; no CVA tenderness ABDOMEN: Positive Bowel Sounds,  soft non-tender; no masses, no organomegaly, no pannus; no intertriginous candida. Rectal Exam: Not done EXTREMITIES: No cyanosis, clubbing or edema; no ulcerations. Genitalia: not examined PULSES: 2+ and symmetric SKIN: Normal hydration no rash or ulceration CNS: Cranial nerves 2-12 grossly intact no focal neurologic deficit    Labs on Admission:  Basic Metabolic Panel:  Recent Labs Lab 06/12/13 1844  NA 137  K 3.7  CL 100  CO2 26  GLUCOSE 96  BUN 16  CREATININE 0.71  CALCIUM 9.3   Liver Function Tests:  Recent Labs Lab 06/12/13 1844  AST 25  ALT 20  ALKPHOS 73  BILITOT 0.3  PROT 7.1  ALBUMIN 4.0   No results found for this basename: LIPASE, AMYLASE,  in the last 168 hours No results found for this basename: AMMONIA,  in the last 168 hours CBC:  Recent Labs Lab 06/12/13 1844  WBC 12.0*  NEUTROABS 8.6*  HGB 13.0  HCT 37.6  MCV 96.7  PLT 245   Cardiac Enzymes: No results found for this basename: CKTOTAL, CKMB, CKMBINDEX, TROPONINI,  in the last 168 hours  BNP (last 3 results)  Recent Labs  06/12/13 1844  PROBNP 172.8   CBG: No results found for this basename: GLUCAP,  in the last 168 hours  Radiological Exams on Admission: Dg Chest 1 View  06/12/2013   CLINICAL DATA:  History of fall complaining of left sided pain.  EXAM: CHEST - 1 VIEW  COMPARISON:  Chest x-ray 12/17/2011.  FINDINGS: Lung volumes are normal. No consolidative airspace disease. No pleural effusions. Linear opacities in the base of the left hemithorax are likely to reflect areas of subsegmental atelectasis in the left  lower lobe. No acute consolidative airspace disease. No pleural effusions. Surgical clips are seen projecting over the lower mediastinum, likely near the gastroesophageal junction. Heart size and mediastinal contours are within normal limits.  IMPRESSION: 1. Probable subsegmental atelectasis in the left lower lobe. In the setting of recent trauma with chest pain, this may be related to splinting.   Electronically Signed   By: Trudie Reed M.D.   On: 06/12/2013 17:49   Dg Hip Bilateral W/pelvis  06/12/2013   CLINICAL DATA:  History of fall complaining of pelvic pain, particularly in the left hip.  EXAM: BILATERAL HIP WITH PELVIS - 4+ VIEW  COMPARISON:  No priors.  FINDINGS: Multiple views of the bony pelvis and left hip demonstrate an acute mildly displaced transcervical neck fracture of the left hip. The left femoral head remains properly located. There is approximately 4 mm of lateral displacement of the distal femoral fragment. No acute displaced fracture of the bony pelvic ring or right hip is noted.  IMPRESSION: 1. Acute mildly displaced transcervical femoral neck fracture of the left hip.   Electronically Signed   By: Trudie Reed M.D.   On: 06/12/2013 17:46   Ct Head Wo Contrast  06/12/2013   CLINICAL DATA:  History of trauma from a fall.  EXAM: CT HEAD WITHOUT CONTRAST  CT CERVICAL SPINE WITHOUT CONTRAST  TECHNIQUE: Multidetector CT imaging of the head and cervical spine was performed following the standard protocol without intravenous contrast. Multiplanar CT image reconstructions of the cervical spine were also generated.  COMPARISON:  Head and cervical spine CT scan 04/08/2013.  FINDINGS: CT HEAD FINDINGS  Mild cerebral atrophy. Patchy and confluent areas of decreased attenuation are noted throughout the deep and periventricular white matter of the cerebral hemispheres bilaterally, compatible with chronic microvascular ischemic disease. No acute displaced skull fractures are identified. No  acute intracranial abnormality. Specifically, no evidence of acute post-traumatic intracranial hemorrhage, no definite regions of acute/subacute cerebral ischemia, no focal mass, mass effect, hydrocephalus or abnormal intra or extra-axial fluid collections. The visualized paranasal sinuses and mastoids are well pneumatized.  CT CERVICAL SPINE FINDINGS  No acute displaced fracture of the cervical spine. Reversal of normal cervical lordosis centered at the  level of C3-C4. This is similar to prior study 04/08/2013, and presumably related to chronic degenerative disease. Alignment is otherwise anatomic. Multilevel degenerative disc disease is noted, most severe at C4-C5. Mild multilevel facet arthropathy is also noted. Prevertebral soft tissues are normal. Visualized portions of the upper thorax are unremarkable.  IMPRESSION: 1. No evidence of significant acute traumatic injury to the head, brain or cervical spine. 2. Mild cerebral atrophy with chronic microvascular ischemic changes in the cerebral white matter. 3. Multilevel degenerative disc disease and cervical spondylosis redemonstrated, as above.   Electronically Signed   By: Trudie Reed M.D.   On: 06/12/2013 17:16   Ct Cervical Spine Wo Contrast  06/12/2013   CLINICAL DATA:  History of trauma from a fall.  EXAM: CT HEAD WITHOUT CONTRAST  CT CERVICAL SPINE WITHOUT CONTRAST  TECHNIQUE: Multidetector CT imaging of the head and cervical spine was performed following the standard protocol without intravenous contrast. Multiplanar CT image reconstructions of the cervical spine were also generated.  COMPARISON:  Head and cervical spine CT scan 04/08/2013.  FINDINGS: CT HEAD FINDINGS  Mild cerebral atrophy. Patchy and confluent areas of decreased attenuation are noted throughout the deep and periventricular white matter of the cerebral hemispheres bilaterally, compatible with chronic microvascular ischemic disease. No acute displaced skull fractures are  identified. No acute intracranial abnormality. Specifically, no evidence of acute post-traumatic intracranial hemorrhage, no definite regions of acute/subacute cerebral ischemia, no focal mass, mass effect, hydrocephalus or abnormal intra or extra-axial fluid collections. The visualized paranasal sinuses and mastoids are well pneumatized.  CT CERVICAL SPINE FINDINGS  No acute displaced fracture of the cervical spine. Reversal of normal cervical lordosis centered at the level of C3-C4. This is similar to prior study 04/08/2013, and presumably related to chronic degenerative disease. Alignment is otherwise anatomic. Multilevel degenerative disc disease is noted, most severe at C4-C5. Mild multilevel facet arthropathy is also noted. Prevertebral soft tissues are normal. Visualized portions of the upper thorax are unremarkable.  IMPRESSION: 1. No evidence of significant acute traumatic injury to the head, brain or cervical spine. 2. Mild cerebral atrophy with chronic microvascular ischemic changes in the cerebral white matter. 3. Multilevel degenerative disc disease and cervical spondylosis redemonstrated, as above.   Electronically Signed   By: Trudie Reed M.D.   On: 06/12/2013 17:16   Dg Shoulder Left  06/12/2013   CLINICAL DATA:  History of fall complaining of left shoulder pain.  EXAM: LEFT SHOULDER - 2+ VIEW  COMPARISON:  No priors.  FINDINGS: Three views of the left shoulder demonstrate no acute displaced fracture or dislocation. The humeral head is high riding, likely related to underlying rotator cuff pathology. Degenerative changes of osteoarthritis are noted in the glenohumeral and acromioclavicular joints.  IMPRESSION: 1. No acute radiographic abnormality of the left shoulder. 2. Findings compatible with rotator cuff disease and osteoarthritis in the glenohumeral and acromioclavicular joints.   Electronically Signed   By: Trudie Reed M.D.   On: 06/12/2013 17:47       Assessment/Plan Principal Problem:   Fracture of femoral neck, left Active Problems:   Hypertension   Hyperlipidemia   Lumbar radiculopathy, chronic   Leukocytosis, unspecified     1.   Fracture of left Femoral Neck-   To surgery in AM by Ortho: Dr. Dion Saucier.  NPO after Midnight, and Pain Control with IV Dilaudid PRN.    2.   HTN-  On Metoprolol RX at home, IV Lopressor while NPO, and Monitor BPs.  3.   Hyperlipidemia-    4.  Lumbar Radiculopathy-  Pain Control PRN.    5.  Leukocytosis-  Probable Stress Rxn, however monitor Trend   6.  DVT Prophylaxis with SCDs.        Code Status:   FULL CODE Family Communication:  Husband and Daughter at bedside   Disposition Plan:   Inpatient  Time spent:  37 Minutes  Ron Parker [facility] Pager [phone]  If 7PM-7AM, please contact night-coverage [email or web address] Password [facility] 06/12/2013, 10:17 PM

## 2013-06-12 NOTE — ED Notes (Signed)
Pt states her knee and hip are not bothering her, it is her muscles in her left leg.

## 2013-06-12 NOTE — ED Notes (Addendum)
[place] was at [employer], states she hit a slick spot on the floor with her cane and fell. Pt denies any neck, or back pain but c/o left groin, and left shoulder pain. Pt rates pain 2/10 when still, 8/10 when she has to move. No loc and pt did not hit her head.

## 2013-06-12 NOTE — ED Notes (Signed)
No shortening, rotation noted to LLE. C/o pain only to groin area. 3+ palpable pedal pulse

## 2013-06-12 NOTE — ED Provider Notes (Signed)
CSN: [id number]     Arrival date & time 06/12/13  1444 History   First MD Initiated Contact with Patient 06/12/13 1511     Chief Complaint  Patient presents with  . Fall   (Consider location/radiation/quality/duration/timing/severity/associated sxs/prior Treatment) Patient is a 79 y.o. female presenting with fall. The history is provided by the patient and the EMS personnel.  Fall Pertinent negatives include no chest pain, no abdominal pain, no headaches and no shortness of breath.   patient status post fall at [facility] fell her left side complaining of pain to her left shoulder and left groin area. Patient thinks she may have hit her head but did not hit it hard. Patient has not been able to ambulate since the fall. Patient has good movement of her left leg and left shoulder. Patient denies any chest pain abdominal pain trouble breathing denies any back pain.  Past Medical History  Diagnosis Date  . Herpes ocular     history of  . GERD (gastroesophageal reflux disease)   . Depression   . Stenosing tenosynovitis of finger 08/2012    right index finger  . High cholesterol   . Hypertension     under control; has been on med. x 10 yr.  . History of hiatal hernia   . Osteoarthritis of knee     bilateral  . Anemia, iron deficiency   . Sciatic pain   . Dental crowns present   . Fracture of femoral neck, left 06/12/2013   Past Surgical History  Procedure Laterality Date  . Lumbar laminectomy/decompression microdiscectomy  08/29/2010    L2-S1  . Shoulder arthroscopy with rotator cuff repair and subacromial decompression Right 01/01/2000  . Total knee arthroplasty Left 05/14/2004  . Abdominal hysterectomy  1995    partial  . Laparoscopic nissen fundoplication  07/03/2001  . Descemets stripping automated endothelial keratoplasty Left 03/14/2011  . Tonsillectomy      as child  . Colonoscopy    . Total knee arthroplasty  12/23/2011    Procedure: TOTAL KNEE ARTHROPLASTY;  Surgeon: Nilda Simmer, MD;  Location: [facility] OR;  Service: Orthopedics;  Laterality: Right;  DR Thurston Hole WANTS 90 MINUTES FOR THIS CASE  . Knee arthroscopy Right 05/11/2001  . Trigger finger release Right 07/21/2007    thumb  . Carpal tunnel release Right 07/21/2007  . Trigger finger release Left 09/24/2007    middle finger  . Carpal tunnel release Left 09/24/2007  . Trigger finger release Right 03/10/2008    ring and little fingers  . Descemets stripping automated endothelial keratoplasty Right 08/20/2012  . Trigger finger release Right 09/02/2012    Procedure: RELEASE TRIGGER FINGER/A-1 PULLEY RIGHT INDEX FINGER;  Surgeon: Nicki Reaper, MD;  Location: [facility];  Service: Orthopedics;  Laterality: Right;  . Trigger finger release Left 12/22/2012    Procedure: RELEASE TRIGGER FINGER/A-1 PULLEY LEFT RING FINGER;  Surgeon: Nicki Reaper, MD;  Location: [facility];  Service: Orthopedics;  Laterality: Left;  Left    Family History  Problem Relation Age of Onset  . Heart attack Mother   . Parkinsonism Father   . Diabetes Brother    History  Substance Use Topics  . Smoking status: Former Games developer  . Smokeless tobacco: Never Used     Comment: quit smoking 20-30 yr. ago  . Alcohol Use: No   OB History   Grav Para Term Preterm Abortions TAB SAB Ect Mult Living  Review of Systems  Constitutional: Negative for fever.  HENT: Negative for congestion.   Respiratory: Negative for shortness of breath.   Cardiovascular: Negative for chest pain.  Gastrointestinal: Negative for abdominal pain.  Genitourinary: Negative for dysuria and hematuria.  Musculoskeletal: Negative for back pain and neck pain.  Skin: Negative for rash and wound.  Neurological: Negative for headaches.  Hematological: Does not bruise/bleed easily.  Psychiatric/Behavioral: Negative for confusion.    Allergies  Morphine and related and Scopace  Home Medications   Current Outpatient Rx  Name  Route   Sig  Dispense  Refill  . acetaminophen (TYLENOL) 500 MG tablet   Oral   Take 500 mg by mouth every 6 (six) hours as needed for pain.         Marland Kitchen ALPRAZolam (XANAX) 0.5 MG tablet   Oral   Take 0.25 mg by mouth at bedtime as needed for sleep.         Marland Kitchen aspirin 81 MG tablet   Oral   Take 81 mg by mouth daily.         . cholecalciferol (VITAMIN D) 1000 UNITS tablet   Oral   Take 2,000 Units by mouth daily.         . Ferrous Gluconate (IRON) 240 (27 FE) MG TABS   Oral   Take 1 tablet by mouth daily.          . metoprolol (LOPRESSOR) 50 MG tablet   Oral   Take 25-50 mg by mouth 2 (two) times daily. Take 50 MG in the morning and take 25 MG at bedtime.         Marland Kitchen omeprazole (PRILOSEC) 40 MG capsule   Oral   Take 40 mg by mouth 2 (two) times daily.          . rosuvastatin (CRESTOR) 10 MG tablet   Oral   Take 10 mg by mouth daily.         Marland Kitchen senna (SENOKOT) 8.6 MG TABS   Oral   Take 1 tablet by mouth at bedtime as needed for mild constipation.          . valACYclovir (VALTREX) 500 MG tablet   Oral   Take 1,000 mg by mouth at bedtime.          Marland Kitchen venlafaxine XR (EFFEXOR-XR) 75 MG 24 hr capsule   Oral   Take 75 mg by mouth at bedtime.           BP 132/58  Pulse 85  Temp(Src) 98 F (36.7 C) (Oral)  Resp 17  SpO2 100% Physical Exam  Nursing note and vitals reviewed. Constitutional: She is oriented to person, place, and time. She appears well-developed and well-nourished. No distress.  HENT:  Head: Normocephalic and atraumatic.  Mouth/Throat: Oropharynx is clear and moist.  Eyes: Conjunctivae and EOM are normal. Pupils are equal, round, and reactive to light.  Neck: Normal range of motion.  Cardiovascular: Normal rate, regular rhythm and normal heart sounds.   No murmur heard. Pulmonary/Chest: Effort normal and breath sounds normal.  Abdominal: Soft. Bowel sounds are normal. There is no tenderness.  Musculoskeletal: Normal range of motion. She  exhibits tenderness.  Tenderness to the left groin area and some mild tenderness to the left shoulder area. Good range of motion of the left hip and of the left shoulder. Distal pulses radial pulse in the left radius is 2+ dorsalis pedis in the left foot has 2+.  Neurological: She is alert  and oriented to person, place, and time. No cranial nerve deficit. She exhibits normal muscle tone. Coordination normal.  Skin: Skin is warm. No rash noted.    ED Course  Procedures (including critical care time) Labs Review Labs Reviewed  CBC WITH DIFFERENTIAL - Abnormal; Notable for the following:    WBC 12.0 (*)    Neutro Abs 8.6 (*)    All other components within normal limits  URINALYSIS, ROUTINE W REFLEX MICROSCOPIC  COMPREHENSIVE METABOLIC PANEL  PRO B NATRIURETIC PEPTIDE  PREPARE RBC (CROSSMATCH)   Results for orders placed during the hospital encounter of 06/12/13  CBC WITH DIFFERENTIAL      Result Value Range   WBC 12.0 (*) 4.0 - 10.5 K/uL   RBC 3.89  3.87 - 5.11 MIL/uL   Hemoglobin 13.0  12.0 - 15.0 g/dL   HCT 16.1  09.6 - 04.5 %   MCV 96.7  78.0 - 100.0 fL   MCH 33.4  26.0 - 34.0 pg   MCHC 34.6  30.0 - 36.0 g/dL   RDW 40.9  81.1 - 91.4 %   Platelets 245  150 - 400 K/uL   Neutrophils Relative % 72  43 - 77 %   Neutro Abs 8.6 (*) 1.7 - 7.7 K/uL   Lymphocytes Relative 18  12 - 46 %   Lymphs Abs 2.2  0.7 - 4.0 K/uL   Monocytes Relative 9  3 - 12 %   Monocytes Absolute 1.0  0.1 - 1.0 K/uL   Eosinophils Relative 1  0 - 5 %   Eosinophils Absolute 0.1  0.0 - 0.7 K/uL   Basophils Relative 0  0 - 1 %   Basophils Absolute 0.0  0.0 - 0.1 K/uL  URINALYSIS, ROUTINE W REFLEX MICROSCOPIC      Result Value Range   Color, Urine YELLOW  YELLOW   APPearance CLEAR  CLEAR   Specific Gravity, Urine 1.009  1.005 - 1.030   pH 7.0  5.0 - 8.0   Glucose, UA NEGATIVE  NEGATIVE mg/dL   Hgb urine dipstick NEGATIVE  NEGATIVE   Bilirubin Urine NEGATIVE  NEGATIVE   Ketones, ur NEGATIVE  NEGATIVE mg/dL    Protein, ur NEGATIVE  NEGATIVE mg/dL   Urobilinogen, UA 0.2  0.0 - 1.0 mg/dL   Nitrite NEGATIVE  NEGATIVE   Leukocytes, UA NEGATIVE  NEGATIVE    Imaging Review Dg Chest 1 View  06/12/2013   CLINICAL DATA:  History of fall complaining of left sided pain.  EXAM: CHEST - 1 VIEW  COMPARISON:  Chest x-ray 12/17/2011.  FINDINGS: Lung volumes are normal. No consolidative airspace disease. No pleural effusions. Linear opacities in the base of the left hemithorax are likely to reflect areas of subsegmental atelectasis in the left lower lobe. No acute consolidative airspace disease. No pleural effusions. Surgical clips are seen projecting over the lower mediastinum, likely near the gastroesophageal junction. Heart size and mediastinal contours are within normal limits.  IMPRESSION: 1. Probable subsegmental atelectasis in the left lower lobe. In the setting of recent trauma with chest pain, this may be related to splinting.   Electronically Signed   By: Trudie Reed M.D.   On: 06/12/2013 17:49   Dg Hip Bilateral W/pelvis  06/12/2013   CLINICAL DATA:  History of fall complaining of pelvic pain, particularly in the left hip.  EXAM: BILATERAL HIP WITH PELVIS - 4+ VIEW  COMPARISON:  No priors.  FINDINGS: Multiple views of the bony pelvis and left hip  demonstrate an acute mildly displaced transcervical neck fracture of the left hip. The left femoral head remains properly located. There is approximately 4 mm of lateral displacement of the distal femoral fragment. No acute displaced fracture of the bony pelvic ring or right hip is noted.  IMPRESSION: 1. Acute mildly displaced transcervical femoral neck fracture of the left hip.   Electronically Signed   By: Trudie Reed M.D.   On: 06/12/2013 17:46   Ct Head Wo Contrast  06/12/2013   CLINICAL DATA:  History of trauma from a fall.  EXAM: CT HEAD WITHOUT CONTRAST  CT CERVICAL SPINE WITHOUT CONTRAST  TECHNIQUE: Multidetector CT imaging of the head and cervical  spine was performed following the standard protocol without intravenous contrast. Multiplanar CT image reconstructions of the cervical spine were also generated.  COMPARISON:  Head and cervical spine CT scan 04/08/2013.  FINDINGS: CT HEAD FINDINGS  Mild cerebral atrophy. Patchy and confluent areas of decreased attenuation are noted throughout the deep and periventricular white matter of the cerebral hemispheres bilaterally, compatible with chronic microvascular ischemic disease. No acute displaced skull fractures are identified. No acute intracranial abnormality. Specifically, no evidence of acute post-traumatic intracranial hemorrhage, no definite regions of acute/subacute cerebral ischemia, no focal mass, mass effect, hydrocephalus or abnormal intra or extra-axial fluid collections. The visualized paranasal sinuses and mastoids are well pneumatized.  CT CERVICAL SPINE FINDINGS  No acute displaced fracture of the cervical spine. Reversal of normal cervical lordosis centered at the level of C3-C4. This is similar to prior study 04/08/2013, and presumably related to chronic degenerative disease. Alignment is otherwise anatomic. Multilevel degenerative disc disease is noted, most severe at C4-C5. Mild multilevel facet arthropathy is also noted. Prevertebral soft tissues are normal. Visualized portions of the upper thorax are unremarkable.  IMPRESSION: 1. No evidence of significant acute traumatic injury to the head, brain or cervical spine. 2. Mild cerebral atrophy with chronic microvascular ischemic changes in the cerebral white matter. 3. Multilevel degenerative disc disease and cervical spondylosis redemonstrated, as above.   Electronically Signed   By: Trudie Reed M.D.   On: 06/12/2013 17:16   Ct Cervical Spine Wo Contrast  06/12/2013   CLINICAL DATA:  History of trauma from a fall.  EXAM: CT HEAD WITHOUT CONTRAST  CT CERVICAL SPINE WITHOUT CONTRAST  TECHNIQUE: Multidetector CT imaging of the head and  cervical spine was performed following the standard protocol without intravenous contrast. Multiplanar CT image reconstructions of the cervical spine were also generated.  COMPARISON:  Head and cervical spine CT scan 04/08/2013.  FINDINGS: CT HEAD FINDINGS  Mild cerebral atrophy. Patchy and confluent areas of decreased attenuation are noted throughout the deep and periventricular white matter of the cerebral hemispheres bilaterally, compatible with chronic microvascular ischemic disease. No acute displaced skull fractures are identified. No acute intracranial abnormality. Specifically, no evidence of acute post-traumatic intracranial hemorrhage, no definite regions of acute/subacute cerebral ischemia, no focal mass, mass effect, hydrocephalus or abnormal intra or extra-axial fluid collections. The visualized paranasal sinuses and mastoids are well pneumatized.  CT CERVICAL SPINE FINDINGS  No acute displaced fracture of the cervical spine. Reversal of normal cervical lordosis centered at the level of C3-C4. This is similar to prior study 04/08/2013, and presumably related to chronic degenerative disease. Alignment is otherwise anatomic. Multilevel degenerative disc disease is noted, most severe at C4-C5. Mild multilevel facet arthropathy is also noted. Prevertebral soft tissues are normal. Visualized portions of the upper thorax are unremarkable.  IMPRESSION: 1. No evidence  of significant acute traumatic injury to the head, brain or cervical spine. 2. Mild cerebral atrophy with chronic microvascular ischemic changes in the cerebral white matter. 3. Multilevel degenerative disc disease and cervical spondylosis redemonstrated, as above.   Electronically Signed   By: Trudie Reed M.D.   On: 06/12/2013 17:16   Dg Shoulder Left  06/12/2013   CLINICAL DATA:  History of fall complaining of left shoulder pain.  EXAM: LEFT SHOULDER - 2+ VIEW  COMPARISON:  No priors.  FINDINGS: Three views of the left shoulder  demonstrate no acute displaced fracture or dislocation. The humeral head is high riding, likely related to underlying rotator cuff pathology. Degenerative changes of osteoarthritis are noted in the glenohumeral and acromioclavicular joints.  IMPRESSION: 1. No acute radiographic abnormality of the left shoulder. 2. Findings compatible with rotator cuff disease and osteoarthritis in the glenohumeral and acromioclavicular joints.   Electronically Signed   By: Trudie Reed M.D.   On: 06/12/2013 17:47    EKG Interpretation    Date/Time:  Saturday June 12 2013 18:14:01 EST Ventricular Rate:  72 PR Interval:  147 QRS Duration: 86 QT Interval:  424 QTC Calculation: 464 R Axis:   78 Text Interpretation:  Sinus rhythm Low voltage, precordial leads Nonspecific T abnormalities, lateral leads No significant change since last tracing Confirmed by Aveyah Greenwood  MD, Brad Mcgaughy ([id number]) on 06/12/2013 6:59:04 PM            MDM   1. Hip fracture, left, closed, initial encounter    Patient status post [place] [facility] Landed on her left side that was complaining of left shoulder pain and left hip pain. Workup reveals a femoral neck fracture on the left. No other injuries found head CT was negative shoulder x-rays were negative. Patient's labs without any significant abnormalities EKG without any acute findings. Results for orders placed during the hospital encounter of 06/12/13  CBC WITH DIFFERENTIAL      Result Value Range   WBC 12.0 (*) 4.0 - 10.5 K/uL   RBC 3.89  3.87 - 5.11 MIL/uL   Hemoglobin 13.0  12.0 - 15.0 g/dL   HCT 16.1  09.6 - 04.5 %   MCV 96.7  78.0 - 100.0 fL   MCH 33.4  26.0 - 34.0 pg   MCHC 34.6  30.0 - 36.0 g/dL   RDW 40.9  81.1 - 91.4 %   Platelets 245  150 - 400 K/uL   Neutrophils Relative % 72  43 - 77 %   Neutro Abs 8.6 (*) 1.7 - 7.7 K/uL   Lymphocytes Relative 18  12 - 46 %   Lymphs Abs 2.2  0.7 - 4.0 K/uL   Monocytes Relative 9  3 - 12 %   Monocytes Absolute 1.0  0.1 - 1.0  K/uL   Eosinophils Relative 1  0 - 5 %   Eosinophils Absolute 0.1  0.0 - 0.7 K/uL   Basophils Relative 0  0 - 1 %   Basophils Absolute 0.0  0.0 - 0.1 K/uL  URINALYSIS, ROUTINE W REFLEX MICROSCOPIC      Result Value Range   Color, Urine YELLOW  YELLOW   APPearance CLEAR  CLEAR   Specific Gravity, Urine 1.009  1.005 - 1.030   pH 7.0  5.0 - 8.0   Glucose, UA NEGATIVE  NEGATIVE mg/dL   Hgb urine dipstick NEGATIVE  NEGATIVE   Bilirubin Urine NEGATIVE  NEGATIVE   Ketones, ur NEGATIVE  NEGATIVE mg/dL   Protein,  ur NEGATIVE  NEGATIVE mg/dL   Urobilinogen, UA 0.2  0.0 - 1.0 mg/dL   Nitrite NEGATIVE  NEGATIVE   Leukocytes, UA NEGATIVE  NEGATIVE   Patient has been followed by the way in her group from orthopedics in the past at the total knee done a year ago. They were contacted they will come in and evaluate the patient for admission and surgical correction of the hip. We'll discuss with internal medicine.    Shelda Jakes, MD 06/12/13 1946

## 2013-06-12 NOTE — ED Notes (Signed)
Report received, assumed care.  

## 2013-06-12 NOTE — ED Notes (Signed)
Called by X ray requesting to order chest xray. ED MD aware, order placed.

## 2013-06-13 ENCOUNTER — Inpatient Hospital Stay (HOSPITAL_COMMUNITY): Payer: Medicare Other | Admitting: Anesthesiology

## 2013-06-13 ENCOUNTER — Encounter (HOSPITAL_COMMUNITY): Payer: Medicare Other | Admitting: Anesthesiology

## 2013-06-13 ENCOUNTER — Inpatient Hospital Stay (HOSPITAL_COMMUNITY): Payer: Medicare Other

## 2013-06-13 ENCOUNTER — Encounter (HOSPITAL_COMMUNITY)
Admission: EM | Disposition: A | Discharge status: Skilled Nursing Facility | Payer: Self-pay | Source: Home / Self Care | Attending: Internal Medicine

## 2013-06-13 DIAGNOSIS — Z9889 Other specified postprocedural states: Secondary | ICD-10-CM | POA: Diagnosis present

## 2013-06-13 DIAGNOSIS — E739 Lactose intolerance, unspecified: Secondary | ICD-10-CM

## 2013-06-13 DIAGNOSIS — K5730 Diverticulosis of large intestine without perforation or abscess without bleeding: Secondary | ICD-10-CM | POA: Insufficient documentation

## 2013-06-13 DIAGNOSIS — I493 Ventricular premature depolarization: Secondary | ICD-10-CM

## 2013-06-13 HISTORY — PX: HIP ARTHROPLASTY: SHX981

## 2013-06-13 LAB — CBC
Hemoglobin: 11.4 g/dL — ABNORMAL LOW (ref 12.0–15.0)
MCH: 33.1 pg (ref 26.0–34.0)
Platelets: 220 10*3/uL (ref 150–400)
RBC: 3.44 MIL/uL — ABNORMAL LOW (ref 3.87–5.11)
WBC: 7.6 10*3/uL (ref 4.0–10.5)

## 2013-06-13 LAB — BASIC METABOLIC PANEL
Calcium: 8.2 mg/dL — ABNORMAL LOW (ref 8.4–10.5)
GFR calc Af Amer: >90 mL/min (ref >90)
GFR calc non Af Amer: 85 mL/min — ABNORMAL LOW (ref >90)
Potassium: 4.5 mEq/L (ref 3.5–5.1)
Sodium: 136 mEq/L (ref 135–145)

## 2013-06-13 LAB — SURGICAL PCR SCREEN
MRSA, PCR: NEGATIVE
Staphylococcus aureus: NEGATIVE

## 2013-06-13 SURGERY — HEMIARTHROPLASTY, HIP, DIRECT ANTERIOR APPROACH, FOR FRACTURE
Anesthesia: General | Site: Hip | Laterality: Left

## 2013-06-13 MED ORDER — LACTATED RINGERS IV SOLN
INTRAVENOUS | Status: DC | PRN
  Administered 2013-06-13 (×2): via INTRAVENOUS

## 2013-06-13 MED ORDER — POTASSIUM CHLORIDE IN NACL 20-0.45 MEQ/L-% IV SOLN
INTRAVENOUS | Status: DC
  Administered 2013-06-13: 14:00:00 via INTRAVENOUS
  Filled 2013-06-13 (×7): qty 1000

## 2013-06-13 MED ORDER — ATORVASTATIN CALCIUM 20 MG PO TABS
20.0000 mg | ORAL_TABLET | Freq: Every day | ORAL | Status: DC
  Administered 2013-06-13 – 2013-06-15 (×3): 20 mg via ORAL
  Filled 2013-06-13 (×4): qty 1

## 2013-06-13 MED ORDER — BUPIVACAINE HCL (PF) 0.25 % IJ SOLN
INTRAMUSCULAR | Status: AC
  Filled 2013-06-13: qty 30

## 2013-06-13 MED ORDER — FENTANYL CITRATE 0.05 MG/ML IJ SOLN: 25.0000 ug | INTRAMUSCULAR | Status: DC | PRN

## 2013-06-13 MED ORDER — ENOXAPARIN SODIUM 40 MG/0.4ML ~~LOC~~ SOLN
40.0000 mg | SUBCUTANEOUS | Status: DC
  Administered 2013-06-14 – 2013-06-16 (×3): 40 mg via SUBCUTANEOUS
  Filled 2013-06-13 (×4): qty 0.4

## 2013-06-13 MED ORDER — GLYCOPYRROLATE 0.2 MG/ML IJ SOLN
INTRAMUSCULAR | Status: DC | PRN
  Administered 2013-06-13: 0.4 mg via INTRAVENOUS

## 2013-06-13 MED ORDER — METHYLPREDNISOLONE ACETATE 40 MG/ML IJ SUSP
INTRAMUSCULAR | Status: AC
  Filled 2013-06-13: qty 1

## 2013-06-13 MED ORDER — HYDROCODONE-ACETAMINOPHEN 10-325 MG PO TABS: 1.0000 | ORAL_TABLET | Freq: Four times a day (QID) | ORAL | Status: DC | PRN

## 2013-06-13 MED ORDER — IRON 240 (27 FE) MG PO TABS: 1.0000 | ORAL_TABLET | Freq: Every day | ORAL | Status: DC

## 2013-06-13 MED ORDER — FENTANYL CITRATE 0.05 MG/ML IJ SOLN
INTRAMUSCULAR | Status: DC | PRN
  Administered 2013-06-13 (×5): 50 ug via INTRAVENOUS

## 2013-06-13 MED ORDER — SODIUM CHLORIDE 0.9 % IR SOLN
Status: DC | PRN
  Administered 2013-06-13 (×2): 1000 mL

## 2013-06-13 MED ORDER — PHENOL 1.4 % MT LIQD: 1.0000 | OROMUCOSAL | Status: DC | PRN

## 2013-06-13 MED ORDER — ALBUMIN HUMAN 5 % IV SOLN
INTRAVENOUS | Status: DC | PRN
  Administered 2013-06-13: 09:00:00 via INTRAVENOUS

## 2013-06-13 MED ORDER — SODIUM CHLORIDE 0.9 % IR SOLN
Status: DC | PRN
  Administered 2013-06-13: 1000 mL

## 2013-06-13 MED ORDER — ROCURONIUM BROMIDE 100 MG/10ML IV SOLN
INTRAVENOUS | Status: DC | PRN
  Administered 2013-06-13: 30 mg via INTRAVENOUS

## 2013-06-13 MED ORDER — ONDANSETRON HCL 4 MG/2ML IJ SOLN: 4.0000 mg | Freq: Four times a day (QID) | INTRAMUSCULAR | Status: DC | PRN

## 2013-06-13 MED ORDER — ALPRAZOLAM 0.25 MG PO TABS: 0.2500 mg | ORAL_TABLET | Freq: Every evening | ORAL | Status: DC | PRN

## 2013-06-13 MED ORDER — CEFAZOLIN SODIUM-DEXTROSE 2-3 GM-% IV SOLR
2.0000 g | Freq: Four times a day (QID) | INTRAVENOUS | Status: AC
  Administered 2013-06-13 (×2): 2 g via INTRAVENOUS
  Filled 2013-06-13 (×2): qty 50

## 2013-06-13 MED ORDER — ENOXAPARIN SODIUM 40 MG/0.4ML ~~LOC~~ SOLN: 40.0000 mg | SUBCUTANEOUS | Status: DC

## 2013-06-13 MED ORDER — METOPROLOL TARTRATE 50 MG PO TABS
50.0000 mg | ORAL_TABLET | Freq: Every day | ORAL | Status: DC
  Administered 2013-06-13 – 2013-06-16 (×4): 50 mg via ORAL
  Filled 2013-06-13 (×4): qty 1

## 2013-06-13 MED ORDER — DOCUSATE SODIUM 100 MG PO CAPS
100.0000 mg | ORAL_CAPSULE | Freq: Two times a day (BID) | ORAL | Status: DC
  Administered 2013-06-13 – 2013-06-16 (×7): 100 mg via ORAL
  Filled 2013-06-13 (×8): qty 1

## 2013-06-13 MED ORDER — LIDOCAINE HCL (CARDIAC) 20 MG/ML IV SOLN
INTRAVENOUS | Status: DC | PRN
  Administered 2013-06-13: 25 mg via INTRAVENOUS

## 2013-06-13 MED ORDER — BISACODYL 10 MG RE SUPP
10.0000 mg | Freq: Every day | RECTAL | Status: DC | PRN
  Administered 2013-06-15: 10 mg via RECTAL
  Filled 2013-06-13: qty 1

## 2013-06-13 MED ORDER — ONDANSETRON HCL 4 MG PO TABS: 4.0000 mg | ORAL_TABLET | Freq: Four times a day (QID) | ORAL | Status: DC | PRN

## 2013-06-13 MED ORDER — POLYETHYLENE GLYCOL 3350 17 G PO PACK
17.0000 g | PACK | Freq: Every day | ORAL | Status: DC | PRN
  Administered 2013-06-15: 17 g via ORAL
  Filled 2013-06-13: qty 1

## 2013-06-13 MED ORDER — PANTOPRAZOLE SODIUM 40 MG PO TBEC
80.0000 mg | DELAYED_RELEASE_TABLET | Freq: Every day | ORAL | Status: DC
Start: 2013-06-13 — End: 2013-06-16
  Administered 2013-06-13 – 2013-06-16 (×4): 80 mg via ORAL
  Filled 2013-06-13 (×4): qty 2

## 2013-06-13 MED ORDER — METHYLPREDNISOLONE ACETATE 40 MG/ML IJ SUSP
INTRAMUSCULAR | Status: DC | PRN
  Administered 2013-06-13: 08:00:00 via INTRAMUSCULAR

## 2013-06-13 MED ORDER — PROPOFOL 10 MG/ML IV BOLUS
INTRAVENOUS | Status: DC | PRN
  Administered 2013-06-13: 100 mg via INTRAVENOUS

## 2013-06-13 MED ORDER — NEOSTIGMINE METHYLSULFATE 1 MG/ML IJ SOLN
INTRAMUSCULAR | Status: DC | PRN
  Administered 2013-06-13: 3 mg via INTRAVENOUS

## 2013-06-13 MED ORDER — ASPIRIN EC 81 MG PO TBEC
81.0000 mg | DELAYED_RELEASE_TABLET | Freq: Every day | ORAL | Status: DC
  Administered 2013-06-13 – 2013-06-15 (×3): 81 mg via ORAL
  Filled 2013-06-13 (×4): qty 1

## 2013-06-13 MED ORDER — PHENYLEPHRINE HCL 10 MG/ML IJ SOLN
INTRAMUSCULAR | Status: DC | PRN
  Administered 2013-06-13: 40 ug via INTRAVENOUS
  Administered 2013-06-13 (×3): 80 ug via INTRAVENOUS
  Administered 2013-06-13 (×2): 120 ug via INTRAVENOUS
  Administered 2013-06-13: 80 ug via INTRAVENOUS

## 2013-06-13 MED ORDER — ALUM & MAG HYDROXIDE-SIMETH 200-200-20 MG/5ML PO SUSP: 30.0000 mL | ORAL | Status: DC | PRN

## 2013-06-13 MED ORDER — METOCLOPRAMIDE HCL 10 MG PO TABS: 5.0000 mg | ORAL_TABLET | Freq: Three times a day (TID) | ORAL | Status: DC | PRN

## 2013-06-13 MED ORDER — ACETAMINOPHEN 325 MG PO TABS: 650.0000 mg | ORAL_TABLET | Freq: Four times a day (QID) | ORAL | Status: DC | PRN

## 2013-06-13 MED ORDER — ASPIRIN 81 MG PO TABS: 81.0000 mg | ORAL_TABLET | Freq: Every day | ORAL | Status: DC

## 2013-06-13 MED ORDER — MAGNESIUM CITRATE PO SOLN: 1.0000 | Freq: Once | ORAL | Status: AC | PRN

## 2013-06-13 MED ORDER — SENNA 8.6 MG PO TABS
1.0000 | ORAL_TABLET | Freq: Every evening | ORAL | Status: DC | PRN
  Administered 2013-06-13: 8.6 mg via ORAL
  Filled 2013-06-13: qty 1

## 2013-06-13 MED ORDER — FERROUS GLUCONATE 324 (38 FE) MG PO TABS
324.0000 mg | ORAL_TABLET | Freq: Every day | ORAL | Status: DC
  Administered 2013-06-14 – 2013-06-16 (×3): 324 mg via ORAL
  Filled 2013-06-13 (×4): qty 1

## 2013-06-13 MED ORDER — VALACYCLOVIR HCL 500 MG PO TABS
1000.0000 mg | ORAL_TABLET | Freq: Every day | ORAL | Status: DC
  Administered 2013-06-13 – 2013-06-15 (×3): 1000 mg via ORAL
  Filled 2013-06-13 (×4): qty 2

## 2013-06-13 MED ORDER — ACETAMINOPHEN 650 MG RE SUPP: 650.0000 mg | Freq: Four times a day (QID) | RECTAL | Status: DC | PRN

## 2013-06-13 MED ORDER — METOCLOPRAMIDE HCL 5 MG/ML IJ SOLN: 5.0000 mg | Freq: Three times a day (TID) | INTRAMUSCULAR | Status: DC | PRN

## 2013-06-13 MED ORDER — SENNA 8.6 MG PO TABS
1.0000 | ORAL_TABLET | Freq: Two times a day (BID) | ORAL | Status: DC
  Administered 2013-06-13 – 2013-06-16 (×7): 8.6 mg via ORAL
  Filled 2013-06-13 (×8): qty 1

## 2013-06-13 MED ORDER — METOPROLOL TARTRATE 25 MG PO TABS
25.0000 mg | ORAL_TABLET | Freq: Every day | ORAL | Status: DC
  Administered 2013-06-13 – 2013-06-15 (×3): 25 mg via ORAL
  Filled 2013-06-13 (×4): qty 1

## 2013-06-13 MED ORDER — DROPERIDOL 2.5 MG/ML IJ SOLN
0.6250 mg | INTRAMUSCULAR | Status: DC | PRN
  Filled 2013-06-13: qty 0.25

## 2013-06-13 MED ORDER — MENTHOL 3 MG MT LOZG: 1.0000 | LOZENGE | OROMUCOSAL | Status: DC | PRN

## 2013-06-13 MED ORDER — BUPIVACAINE HCL 0.25 % IJ SOLN
INTRAMUSCULAR | Status: DC | PRN
  Administered 2013-06-13: 15 mL

## 2013-06-13 MED ORDER — ARTIFICIAL TEARS OP OINT
TOPICAL_OINTMENT | OPHTHALMIC | Status: DC | PRN
  Administered 2013-06-13: 1 via OPHTHALMIC

## 2013-06-13 MED ORDER — VITAMIN D3 25 MCG (1000 UNIT) PO TABS
2000.0000 [IU] | ORAL_TABLET | Freq: Every day | ORAL | Status: DC
  Administered 2013-06-13 – 2013-06-15 (×3): 2000 [IU] via ORAL
  Filled 2013-06-13 (×4): qty 2

## 2013-06-13 MED ORDER — PNEUMOCOCCAL VAC POLYVALENT 25 MCG/0.5ML IJ INJ
0.5000 mL | INJECTION | INTRAMUSCULAR | Status: AC
  Administered 2013-06-14: 0.5 mL via INTRAMUSCULAR
  Filled 2013-06-13: qty 0.5

## 2013-06-13 MED ORDER — EPHEDRINE SULFATE 50 MG/ML IJ SOLN
INTRAMUSCULAR | Status: DC | PRN
  Administered 2013-06-13 (×2): 10 mg via INTRAVENOUS

## 2013-06-13 MED ORDER — METOPROLOL TARTRATE 25 MG PO TABS: 25.0000 mg | ORAL_TABLET | Freq: Two times a day (BID) | ORAL | Status: DC

## 2013-06-13 MED ORDER — ONDANSETRON HCL 4 MG/2ML IJ SOLN
INTRAMUSCULAR | Status: DC | PRN
  Administered 2013-06-13: 4 mg via INTRAVENOUS

## 2013-06-13 MED ORDER — VENLAFAXINE HCL ER 75 MG PO CP24
75.0000 mg | ORAL_CAPSULE | Freq: Every day | ORAL | Status: DC
  Administered 2013-06-13 – 2013-06-15 (×3): 75 mg via ORAL
  Filled 2013-06-13 (×5): qty 1

## 2013-06-13 MED ORDER — SENNA-DOCUSATE SODIUM 8.6-50 MG PO TABS: 2.0000 | ORAL_TABLET | Freq: Every day | ORAL | Status: DC

## 2013-06-13 SURGICAL SUPPLY — 59 items
APL SKNCLS STERI-STRIP NONHPOA (GAUZE/BANDAGES/DRESSINGS)
BENZOIN TINCTURE PRP APPL 2/3 (GAUZE/BANDAGES/DRESSINGS) ×1 IMPLANT
BLADE SAW SGTL 18.5X63.X.64 HD (BLADE) ×2 IMPLANT
BRUSH FEMORAL CANAL (MISCELLANEOUS) ×1 IMPLANT
CAPT HIP FX BIPOLAR/UNIPOLAR ×1 IMPLANT
CEMENT BONE DEPUY (Cement) ×2 IMPLANT
CEMENT RESTRICTOR DEPUY SZ 3 (Cement) ×1 IMPLANT
CLOTH BEACON ORANGE TIMEOUT ST (SAFETY) ×1 IMPLANT
COVER BACK TABLE 24X17X13 BIG (DRAPES) ×1 IMPLANT
COVER SURGICAL LIGHT HANDLE (MISCELLANEOUS) ×2 IMPLANT
DRAPE INCISE IOBAN 66X45 STRL (DRAPES) ×1 IMPLANT
DRAPE ORTHO SPLIT 77X108 STRL (DRAPES) ×4
DRAPE SURG ORHT 6 SPLT 77X108 (DRAPES) ×2 IMPLANT
DRAPE U-SHAPE 47X51 STRL (DRAPES) ×2 IMPLANT
DRILL BIT 5/64 (BIT) ×2 IMPLANT
DRSG MEPILEX BORDER 4X12 (GAUZE/BANDAGES/DRESSINGS) ×1 IMPLANT
DRSG MEPILEX BORDER 4X8 (GAUZE/BANDAGES/DRESSINGS) IMPLANT
DURAPREP 26ML APPLICATOR (WOUND CARE) ×2 IMPLANT
ELECT BLADE 6.5 EXT (BLADE) ×1 IMPLANT
ELECT CAUTERY BLADE 6.4 (BLADE) ×2 IMPLANT
ELECT REM PT RETURN 9FT ADLT (ELECTROSURGICAL) ×2
ELECTRODE REM PT RTRN 9FT ADLT (ELECTROSURGICAL) ×1 IMPLANT
FACESHIELD LNG OPTICON STERILE (SAFETY) ×4 IMPLANT
GLOVE BIOGEL PI ORTHO PRO SZ8 (GLOVE) ×1
GLOVE ORTHO TXT STRL SZ7.5 (GLOVE) ×3 IMPLANT
GLOVE PI ORTHO PRO STRL SZ8 (GLOVE) ×1 IMPLANT
GLOVE SURG ORTHO 8.0 STRL STRW (GLOVE) ×2 IMPLANT
GOWN STRL NON-REIN LRG LVL3 (GOWN DISPOSABLE) ×4 IMPLANT
GOWN STRL REIN 2XL XLG LVL4 (GOWN DISPOSABLE) ×2 IMPLANT
HANDPIECE INTERPULSE COAX TIP (DISPOSABLE) ×2
IV NS 1000ML (IV SOLUTION) ×4
IV NS 1000ML BAXH (IV SOLUTION) IMPLANT
KIT BASIN OR (CUSTOM PROCEDURE TRAY) ×2 IMPLANT
KIT ROOM TURNOVER OR (KITS) ×2 IMPLANT
MANIFOLD NEPTUNE II (INSTRUMENTS) ×2 IMPLANT
NEEDLE 22X1 1/2 (OR ONLY) (NEEDLE) ×3 IMPLANT
NS IRRIG 1000ML POUR BTL (IV SOLUTION) ×2 IMPLANT
PACK TOTAL JOINT (CUSTOM PROCEDURE TRAY) ×2 IMPLANT
PAD ARMBOARD 7.5X6 YLW CONV (MISCELLANEOUS) ×5 IMPLANT
PASSER SUT SWANSON 36MM LOOP (INSTRUMENTS) ×2 IMPLANT
PILLOW ABDUCTION HIP (SOFTGOODS) ×2 IMPLANT
PRESSURIZER FEMORAL UNIV (MISCELLANEOUS) ×1 IMPLANT
RETRIEVER SUT HEWSON (MISCELLANEOUS) ×2 IMPLANT
SET HNDPC FAN SPRY TIP SCT (DISPOSABLE) IMPLANT
STRIP CLOSURE SKIN 1/2X4 (GAUZE/BANDAGES/DRESSINGS) ×1 IMPLANT
SUT FIBERWIRE #2 38 REV NDL BL (SUTURE) ×6
SUT MNCRL AB 4-0 PS2 18 (SUTURE) ×2 IMPLANT
SUT VIC AB 0 CT1 27 (SUTURE) ×4
SUT VIC AB 0 CT1 27XBRD ANBCTR (SUTURE) ×1 IMPLANT
SUT VIC AB 1 CT1 27 (SUTURE) ×4
SUT VIC AB 1 CT1 27XBRD ANBCTR (SUTURE) ×2 IMPLANT
SUT VIC AB 3-0 SH 8-18 (SUTURE) ×2 IMPLANT
SUTURE FIBERWR#2 38 REV NDL BL (SUTURE) ×2 IMPLANT
SYR CONTROL 10ML LL (SYRINGE) ×3 IMPLANT
TOWEL OR 17X24 6PK STRL BLUE (TOWEL DISPOSABLE) ×2 IMPLANT
TOWEL OR 17X26 10 PK STRL BLUE (TOWEL DISPOSABLE) ×2 IMPLANT
TOWER CARTRIDGE SMART MIX (DISPOSABLE) ×1 IMPLANT
TRAY FOLEY CATH 16FRSI W/METER (SET/KITS/TRAYS/PACK) ×1 IMPLANT
WATER STERILE IRR 1000ML POUR (IV SOLUTION) ×4 IMPLANT

## 2013-06-13 NOTE — Progress Notes (Signed)
The patient has been re-examined, and the chart reviewed, and there have been no interval changes to the documented history and physical.    The risks, benefits, and alternatives have been discussed at length, and the patient is willing to proceed.    Complains of left shoulder pain, XR negative, will plan to inject left shoulder during anesthesia.  Eulas Post, MD

## 2013-06-13 NOTE — Anesthesia Procedure Notes (Signed)
Procedure Name: Intubation Date/Time: 06/13/2013 7:52 AM Performed by: Fransisca Kaufmann Pre-anesthesia Checklist: Patient identified, Emergency Drugs available, Suction available, Patient being monitored and Timeout performed Patient Re-evaluated:Patient Re-evaluated prior to inductionOxygen Delivery Method: Circle system utilized Preoxygenation: Pre-oxygenation with 100% oxygen Intubation Type: IV induction Ventilation: Mask ventilation without difficulty Laryngoscope size: attempted w  Hyacinth Meeker 2 and Mac 3. Grade View: Grade III Tube type: Oral Number of attempts: 2 Airway Equipment and Method: Stylet and Video-laryngoscopy Placement Confirmation: ETT inserted through vocal cords under direct vision,  positive ETCO2 and breath sounds checked- equal and bilateral Secured at: 20 cm Tube secured with: Tape Dental Injury: Teeth and Oropharynx as per pre-operative assessment  Difficulty Due To: Difficult Airway- due to anterior larynx and Difficulty was anticipated Comments: Narrow mouth, almost fixed mandible, very anterior

## 2013-06-13 NOTE — Anesthesia Postprocedure Evaluation (Signed)
  Anesthesia Post-op Note  Patient: Kristin Solomon  Procedure(s) Performed: Procedure(s): ARTHROPLASTY BIPOLAR HIP; Injection left shoulder (Left)  Patient Location: PACU  Anesthesia Type:General  Level of Consciousness: awake, alert , oriented and patient cooperative  Airway and Oxygen Therapy: Patient Spontanous Breathing and Patient connected to nasal cannula oxygen  Post-op Pain: mild  Post-op Assessment: Post-op Vital signs reviewed, Patient's Cardiovascular Status Stable, Respiratory Function Stable, Patent Airway, No signs of Nausea or vomiting and Pain level controlled  Post-op Vital Signs: Reviewed and stable  Complications: No apparent anesthesia complications

## 2013-06-13 NOTE — Preoperative (Addendum)
Beta Blockers   Reason not to administer Beta Blockers:Not Applicable Beta blocker held intraop for 9am dose due to episodes of hypotension per order Dr  Jean Rosenthal. To be given when BP stable.

## 2013-06-13 NOTE — Progress Notes (Signed)
Subjective: Just back from surgery, groggy, no new complaints  Objective: Vital signs in last 24 hours: Temp:  [98 F (36.7 C)-99 F (37.2 C)] 98.5 F (36.9 C) (12/21 0605) Pulse Rate:  [69-96] 87 (12/21 0736) Resp:  [14-20] 20 (12/21 0605) BP: (105-145)/(43-93) 140/60 mmHg (12/21 0605) SpO2:  [91 %-100 %] 92 % (12/21 0605) Weight:  [60.5 kg (133 lb 6.1 oz)] 60.5 kg (133 lb 6.1 oz) (12/20 2149) Weight change:  Last BM Date: 06/12/13  Intake/Output from previous day: 12/20 0701 - 12/21 0700 In: -  Out: 825 [Urine:825] Intake/Output this shift: Total I/O In: 700 [I.V.:700] Out: -   General appearance: alert and cooperative Resp: clear to auscultation bilaterally Cardio: regular rate and rhythm, S1, S2 normal, no murmur, click, rub or gallop  Lab Results:  Recent Labs  06/12/13 1844 06/13/13 0445  WBC 12.0* 7.6  HGB 13.0 11.4*  HCT 37.6 33.4*  PLT 245 220   BMET  Recent Labs  06/12/13 1844 06/13/13 0445  NA 137 136  K 3.7 4.5  CL 100 103  CO2 26 25  GLUCOSE 96 108*  BUN 16 13  CREATININE 0.71 0.57  CALCIUM 9.3 8.2*    Studies/Results: Dg Chest 1 View  06/12/2013   CLINICAL DATA:  History of fall complaining of left sided pain.  EXAM: CHEST - 1 VIEW  COMPARISON:  Chest x-ray 12/17/2011.  FINDINGS: Lung volumes are normal. No consolidative airspace disease. No pleural effusions. Linear opacities in the base of the left hemithorax are likely to reflect areas of subsegmental atelectasis in the left lower lobe. No acute consolidative airspace disease. No pleural effusions. Surgical clips are seen projecting over the lower mediastinum, likely near the gastroesophageal junction. Heart size and mediastinal contours are within normal limits.  IMPRESSION: 1. Probable subsegmental atelectasis in the left lower lobe. In the setting of recent trauma with chest pain, this may be related to splinting.   Electronically Signed   By: Trudie Reed M.D.   On: 06/12/2013 17:49    Dg Hip Bilateral W/pelvis  06/12/2013   CLINICAL DATA:  History of fall complaining of pelvic pain, particularly in the left hip.  EXAM: BILATERAL HIP WITH PELVIS - 4+ VIEW  COMPARISON:  No priors.  FINDINGS: Multiple views of the bony pelvis and left hip demonstrate an acute mildly displaced transcervical neck fracture of the left hip. The left femoral head remains properly located. There is approximately 4 mm of lateral displacement of the distal femoral fragment. No acute displaced fracture of the bony pelvic ring or right hip is noted.  IMPRESSION: 1. Acute mildly displaced transcervical femoral neck fracture of the left hip.   Electronically Signed   By: Trudie Reed M.D.   On: 06/12/2013 17:46   Ct Head Wo Contrast  06/12/2013   CLINICAL DATA:  History of trauma from a fall.  EXAM: CT HEAD WITHOUT CONTRAST  CT CERVICAL SPINE WITHOUT CONTRAST  TECHNIQUE: Multidetector CT imaging of the head and cervical spine was performed following the standard protocol without intravenous contrast. Multiplanar CT image reconstructions of the cervical spine were also generated.  COMPARISON:  Head and cervical spine CT scan 04/08/2013.  FINDINGS: CT HEAD FINDINGS  Mild cerebral atrophy. Patchy and confluent areas of decreased attenuation are noted throughout the deep and periventricular white matter of the cerebral hemispheres bilaterally, compatible with chronic microvascular ischemic disease. No acute displaced skull fractures are identified. No acute intracranial abnormality. Specifically, no evidence of acute post-traumatic  intracranial hemorrhage, no definite regions of acute/subacute cerebral ischemia, no focal mass, mass effect, hydrocephalus or abnormal intra or extra-axial fluid collections. The visualized paranasal sinuses and mastoids are well pneumatized.  CT CERVICAL SPINE FINDINGS  No acute displaced fracture of the cervical spine. Reversal of normal cervical lordosis centered at the level of C3-C4.  This is similar to prior study 04/08/2013, and presumably related to chronic degenerative disease. Alignment is otherwise anatomic. Multilevel degenerative disc disease is noted, most severe at C4-C5. Mild multilevel facet arthropathy is also noted. Prevertebral soft tissues are normal. Visualized portions of the upper thorax are unremarkable.  IMPRESSION: 1. No evidence of significant acute traumatic injury to the head, brain or cervical spine. 2. Mild cerebral atrophy with chronic microvascular ischemic changes in the cerebral white matter. 3. Multilevel degenerative disc disease and cervical spondylosis redemonstrated, as above.   Electronically Signed   By: Trudie Reed M.D.   On: 06/12/2013 17:16   Ct Cervical Spine Wo Contrast  06/12/2013   CLINICAL DATA:  History of trauma from a fall.  EXAM: CT HEAD WITHOUT CONTRAST  CT CERVICAL SPINE WITHOUT CONTRAST  TECHNIQUE: Multidetector CT imaging of the head and cervical spine was performed following the standard protocol without intravenous contrast. Multiplanar CT image reconstructions of the cervical spine were also generated.  COMPARISON:  Head and cervical spine CT scan 04/08/2013.  FINDINGS: CT HEAD FINDINGS  Mild cerebral atrophy. Patchy and confluent areas of decreased attenuation are noted throughout the deep and periventricular white matter of the cerebral hemispheres bilaterally, compatible with chronic microvascular ischemic disease. No acute displaced skull fractures are identified. No acute intracranial abnormality. Specifically, no evidence of acute post-traumatic intracranial hemorrhage, no definite regions of acute/subacute cerebral ischemia, no focal mass, mass effect, hydrocephalus or abnormal intra or extra-axial fluid collections. The visualized paranasal sinuses and mastoids are well pneumatized.  CT CERVICAL SPINE FINDINGS  No acute displaced fracture of the cervical spine. Reversal of normal cervical lordosis centered at the level of  C3-C4. This is similar to prior study 04/08/2013, and presumably related to chronic degenerative disease. Alignment is otherwise anatomic. Multilevel degenerative disc disease is noted, most severe at C4-C5. Mild multilevel facet arthropathy is also noted. Prevertebral soft tissues are normal. Visualized portions of the upper thorax are unremarkable.  IMPRESSION: 1. No evidence of significant acute traumatic injury to the head, brain or cervical spine. 2. Mild cerebral atrophy with chronic microvascular ischemic changes in the cerebral white matter. 3. Multilevel degenerative disc disease and cervical spondylosis redemonstrated, as above.   Electronically Signed   By: Trudie Reed M.D.   On: 06/12/2013 17:16   Dg Shoulder Left  06/12/2013   CLINICAL DATA:  History of fall complaining of left shoulder pain.  EXAM: LEFT SHOULDER - 2+ VIEW  COMPARISON:  No priors.  FINDINGS: Three views of the left shoulder demonstrate no acute displaced fracture or dislocation. The humeral head is high riding, likely related to underlying rotator cuff pathology. Degenerative changes of osteoarthritis are noted in the glenohumeral and acromioclavicular joints.  IMPRESSION: 1. No acute radiographic abnormality of the left shoulder. 2. Findings compatible with rotator cuff disease and osteoarthritis in the glenohumeral and acromioclavicular joints.   Electronically Signed   By: Trudie Reed M.D.   On: 06/12/2013 17:47    Medications: I have reviewed the patient's current medications.  Assessment/Plan: Principal Problem:   Fracture of femoral neck, left hip surgery this AM Active Problems:   Hyperlipidemia   Hypertension  BP ok   Lumbar radiculopathy, chronic    LOS: 1 day   Kristin Solomon JOSEPH 06/13/2013, 8:43 AM

## 2013-06-13 NOTE — Anesthesia Preprocedure Evaluation (Addendum)
Anesthesia Evaluation  Patient identified by MRN, date of birth, ID band Patient awake    Reviewed: Allergy & Precautions, H&P , NPO status , Patient's Chart, lab work & pertinent test results, reviewed documented beta blocker date and time   History of Anesthesia Complications Negative for: history of anesthetic complications  Airway Mallampati: II TM Distance: >3 FB Neck ROM: Full    Dental  (+) Missing and Dental Advisory Given   Pulmonary former smoker (quit 30 years),  breath sounds clear to auscultation  Pulmonary exam normal       Cardiovascular hypertension, Pt. on medications and Pt. on home beta blockers - anginaRhythm:Regular Rate:Normal     Neuro/Psych Depression Sciatic back pain    GI/Hepatic Neg liver ROS, GERD-  Medicated and Controlled,  Endo/Other  negative endocrine ROS  Renal/GU negative Renal ROS     Musculoskeletal   Abdominal   Peds  Hematology  (+) Blood dyscrasia (Hb 11.4), anemia ,   Anesthesia Other Findings   Reproductive/Obstetrics                          Anesthesia Physical Anesthesia Plan  ASA: III  Anesthesia Plan: General   Post-op Pain Management:    Induction: Intravenous  Airway Management Planned: Oral ETT  Additional Equipment:   Intra-op Plan:   Post-operative Plan: Extubation in OR  Informed Consent: I have reviewed the patients History and Physical, chart, labs and discussed the procedure including the risks, benefits and alternatives for the proposed anesthesia with the patient or authorized representative who has indicated his/her understanding and acceptance.   Dental advisory given  Plan Discussed with: CRNA and Surgeon  Anesthesia Plan Comments: (Plan routine monitors, GETA)        Anesthesia Quick Evaluation

## 2013-06-13 NOTE — Op Note (Signed)
06/12/2013 - 06/13/2013  9:25 AM  PATIENT:  Kristin Solomon   MRN: [id number]  PRE-OPERATIVE DIAGNOSIS:  left displaced femoral neck fracture, left shoulder pain, rotator cuff pathology  POST-OPERATIVE DIAGNOSIS:  left displaced femoral neck fracture, left shoulder pain, rotator cuff pathology  PROCEDURE:  Procedure(s): ARTHROPLASTY BIPOLAR HIP; Injection left shoulder  PREOPERATIVE INDICATIONS:  Kristin Solomon is an 79 y.o. female who was admitted 06/12/2013 with a diagnosis of Fracture of femoral neck, left and elected for surgical management.  The risks benefits and alternatives were discussed with the patient including but not limited to the risks of nonoperative treatment, versus surgical intervention including infection, bleeding, nerve injury, periprosthetic fracture, the need for revision surgery, dislocation, leg length discrepancy, blood clots, cardiopulmonary complications, morbidity, mortality, among others, and they were willing to proceed.  Predicted outcome is good, although there will be at least a six to nine month expected recovery.   OPERATIVE REPORT     SURGEON:  Teryl Lucy, MD    ASSISTANT:  Skip Mayer, PA-C (Present throughout the entire procedure,  necessary for completion of procedure in a timely manner, assisting with retraction, instrumentation, and closure)     ANESTHESIA:  General    COMPLICATIONS:  None.      COMPONENTS:  Depuy Summit Basic Femoral Fracture stem size 4, with a stem centralizer, a cement restrictor, with a 0 spacer and a fracture head unipolar hip ball, and a total of 2 bags of Depuy CMW 1 Bone Cement.    PROCEDURE IN DETAIL: The patient was met in the holding area and identified.  The appropriate hip  was marked at the operative site. The patient was then transported to the OR and  placed under general anesthesia.  At that point, the patient was  placed in the lateral decubitus position with the operative side up and  secured  to the operating room table and all bony prominences padded.     The operative lower extremity was prepped from the iliac crest to the toes.  Sterile draping was performed.  Time out was performed prior to incision.      A routine posterolateral approach was utilized via sharp dissection  carried down to the subcutaneous tissue.  Gross bleeders were Bovie  coagulated.  The iliotibial band was identified and incised  along the length of the skin incision.  Self-retaining retractors were  inserted.  With the hip internally rotated, the short external rotators  were identified. The piriformis was tagged with FiberWire, and the hip capsule released in a T-type fashion.  The femoral neck was exposed, and I resected the femoral neck using the appropriate jig. This was performed at approximately a thumb's breadth above the lesser trochanter.    I then exposed the deep acetabulum, cleared out any tissue including the ligamentum teres, and included the hip capsule in the FiberWire used above and below the T.    I then prepared the proximal femur using the cookie-cutter, the lateralizing reamer, and then sequentially broached.  A trial utilized, and I reduced the hip and it was found to have excellent stability with functional range of motion. The trial components were then removed.   I then placed a cement restrictor, and cemented the real components in place. All excess cement was removed. Once the cement had cured, I impacted the real head ball into place. The hip was then reduced and taken through functional range of motion and found to have excellent stability. Leg lengths were  restored.  I then used a 2 mm drill bits to pass the FiberWire suture from the capsule and puriform is through the greater trochanter, and secured this. Excellent posterior capsular repair was achieved. I also closed the T in the capsule.  I then irrigated the hip copiously again with pulse lavage, and repaired the fascia with  Vicryl, followed by Vicryl for the subcutaneous tissue, the wounds were injected, and Monocryl used for the skin, with Steri-Strips and sterile gauze. The left shoulder was then prepped with chlorhexidine, and injected into the subacromial space through the posterior portal using 4 cc of Xylocaine and 1 cc of Depo-Medrol. A Band-Aid was placed.  The patient was then awakened and returned to PACU in stable and satisfactory condition. There were no complications.  Teryl Lucy, MD Orthopedic Surgeon [phone]   06/13/2013 9:25 AM

## 2013-06-13 NOTE — Transfer of Care (Signed)
Immediate Anesthesia Transfer of Care Note  Patient: Kristin Solomon  Procedure(s) Performed: Procedure(s): ARTHROPLASTY BIPOLAR HIP; Injection left shoulder (Left)  Patient Location: PACU  Anesthesia Type:General  Level of Consciousness: awake, alert , oriented and sedated  Airway & Oxygen Therapy: Patient Spontanous Breathing and Patient connected to nasal cannula oxygen  Post-op Assessment: Report given to PACU RN, Post -op Vital signs reviewed and stable and Patient moving all extremities  Post vital signs: Reviewed and stable  Complications: No apparent anesthesia complications

## 2013-06-13 NOTE — Progress Notes (Signed)
Orthopedic Tech Progress Note Patient Details:  Kristin Solomon [birth year] [id number]  Patient ID: Kristin Solomon, female   DOB: 1929/12/06, 79 y.o.   MRN: [id number] Trapeze bar patient helper  Nikki Dom 06/13/2013, 2:58 PM

## 2013-06-14 ENCOUNTER — Encounter (HOSPITAL_COMMUNITY): Payer: Self-pay | Admitting: Emergency Medicine

## 2013-06-14 LAB — CBC
MCH: 33.7 pg (ref 26.0–34.0)
MCV: 97.1 fL (ref 78.0–100.0)
Platelets: 185 10*3/uL (ref 150–400)
RBC: 2.73 MIL/uL — ABNORMAL LOW (ref 3.87–5.11)
RDW: 12.5 % (ref 11.5–15.5)
WBC: 9.5 10*3/uL (ref 4.0–10.5)

## 2013-06-14 LAB — BASIC METABOLIC PANEL
CO2: 25 mEq/L (ref 19–32)
Calcium: 8.5 mg/dL (ref 8.4–10.5)
Creatinine, Ser: 0.5 mg/dL (ref 0.50–1.10)
GFR calc Af Amer: >90 mL/min (ref >90)
GFR calc non Af Amer: 88 mL/min — ABNORMAL LOW (ref >90)
Potassium: 4.2 mEq/L (ref 3.5–5.1)
Sodium: 134 mEq/L — ABNORMAL LOW (ref 135–145)

## 2013-06-14 NOTE — Progress Notes (Signed)
Clinical Social Work Department CLINICAL SOCIAL WORK PLACEMENT NOTE 06/14/2013  Patient:  ABIGAIL, TEALL  Account Number:  [id number] Admit date:  06/12/2013  Clinical Social Worker:  Jetta Lout, [place]  Date/time:  06/14/2013 06:41 PM  Clinical Social Work is seeking post-discharge placement for this patient at the following level of care:   SKILLED NURSING   (*CSW will update this form in Epic as items are completed)   06/14/2013  Patient/family provided with [facility] Department of Clinical Social Work's list of facilities offering this level of care within the geographic area requested by the patient (or if unable, by the patient's family).  06/14/2013  Patient/family informed of their freedom to choose among providers that offer the needed level of care, that participate in Medicare, Medicaid or managed care program needed by the patient, have an available bed and are willing to accept the patient.  06/14/2013  Patient/family informed of [facility]' ownership interest in [facility], as well as of the fact that they are under no obligation to receive care at this facility.  PASARR submitted to [employer] on 08/30/2010 PASARR number received from [employer] on 08/30/2010  FL2 transmitted to all facilities in geographic area requested by pt/family on  06/14/2013 FL2 transmitted to all facilities within larger geographic area on   Patient informed that his/her managed care company has contracts with or will negotiate with  certain facilities, including the following:     Patient/family informed of bed offers received:   Patient chooses bed at  Physician recommends and patient chooses bed at    Patient to be transferred to  on   Patient to be transferred to facility by   The following physician request were entered in Epic:   Additional Comments:

## 2013-06-14 NOTE — Progress Notes (Signed)
Subjective: Feeling much better today and very alert. Not confused at all. Not short of breath. O2 saturation monitor not working well. Hearing from 64% to 99%. Will discontinue continuous and check every 4 hours. Low-grade fever yesterday afternoon resolved. Overall feeling much better. Still with left shoulder soreness and doing well in terms of left hip pain  Objective: Weight change:   Intake/Output Summary (Last 24 hours) at 06/14/13 0804 Last data filed at 06/14/13 0631  Gross per 24 hour  Intake   3540 ml  Output   3325 ml  Net    215 ml   Filed Vitals:   06/13/13 1618 06/13/13 2100 06/14/13 0147 06/14/13 0500  BP: 108/54 107/56 125/61 133/54  Pulse: 98 88 87 96  Temp: 100.2 F (37.9 C) 98.1 F (36.7 C) 97.9 F (36.6 C) 97.6 F (36.4 C)  TempSrc: Oral Oral Oral Oral  Resp: 18 20 20 20   Height:      Weight:      SpO2: 97% 95% 95% 97%    General appearance: alert and cooperative  Resp: clear to auscultation bilaterally  Cardio: regular rate and rhythm, S1, S2 normal, no murmur, click, rub or gallop Abdomen: Soft nontender bowel sounds normal Extremities: Left hip wound dry with minimal bruising. It is sore when she raises her left arm above the horizontal in the left shoulder area. Neurological: Oriented x3 nonfocal  Lab Results: Results for orders placed during the hospital encounter of 06/12/13 (from the past 48 hour(s))  CBC WITH DIFFERENTIAL     Status: Abnormal   Collection Time    06/12/13  6:44 PM      Result Value Range   WBC 12.0 (*) 4.0 - 10.5 K/uL   RBC 3.89  3.87 - 5.11 MIL/uL   Hemoglobin 13.0  12.0 - 15.0 g/dL   HCT 11.9  14.7 - 82.9 %   MCV 96.7  78.0 - 100.0 fL   MCH 33.4  26.0 - 34.0 pg   MCHC 34.6  30.0 - 36.0 g/dL   RDW 56.2  13.0 - 86.5 %   Platelets 245  150 - 400 K/uL   Neutrophils Relative % 72  43 - 77 %   Neutro Abs 8.6 (*) 1.7 - 7.7 K/uL   Lymphocytes Relative 18  12 - 46 %   Lymphs Abs 2.2  0.7 - 4.0 K/uL   Monocytes Relative 9  3  - 12 %   Monocytes Absolute 1.0  0.1 - 1.0 K/uL   Eosinophils Relative 1  0 - 5 %   Eosinophils Absolute 0.1  0.0 - 0.7 K/uL   Basophils Relative 0  0 - 1 %   Basophils Absolute 0.0  0.0 - 0.1 K/uL  COMPREHENSIVE METABOLIC PANEL     Status: Abnormal   Collection Time    06/12/13  6:44 PM      Result Value Range   Sodium 137  135 - 145 mEq/L   Potassium 3.7  3.5 - 5.1 mEq/L   Chloride 100  96 - 112 mEq/L   CO2 26  19 - 32 mEq/L   Glucose, Bld 96  70 - 99 mg/dL   BUN 16  6 - 23 mg/dL   Creatinine, Ser 7.84  0.50 - 1.10 mg/dL   Calcium 9.3  8.4 - 69.6 mg/dL   Total Protein 7.1  6.0 - 8.3 g/dL   Albumin 4.0  3.5 - 5.2 g/dL   AST 25  0 - 37  U/L   ALT 20  0 - 35 U/L   Alkaline Phosphatase 73  39 - 117 U/L   Total Bilirubin 0.3  0.3 - 1.2 mg/dL   GFR calc non Af Amer 79 (*) >90 mL/min   GFR calc Af Amer >90  >90 mL/min   Comment: (NOTE)     The eGFR has been calculated using the CKD EPI equation.     This calculation has not been validated in all clinical situations.     eGFR's persistently <90 mL/min signify possible Chronic Kidney     Disease.  PRO B NATRIURETIC PEPTIDE     Status: None   Collection Time    06/12/13  6:44 PM      Result Value Range   Pro B Natriuretic peptide (BNP) 172.8  0 - 450 pg/mL  URINALYSIS, ROUTINE W REFLEX MICROSCOPIC     Status: None   Collection Time    06/12/13  6:53 PM      Result Value Range   Color, Urine YELLOW  YELLOW   APPearance CLEAR  CLEAR   Specific Gravity, Urine 1.009  1.005 - 1.030   pH 7.0  5.0 - 8.0   Glucose, UA NEGATIVE  NEGATIVE mg/dL   Hgb urine dipstick NEGATIVE  NEGATIVE   Bilirubin Urine NEGATIVE  NEGATIVE   Ketones, ur NEGATIVE  NEGATIVE mg/dL   Protein, ur NEGATIVE  NEGATIVE mg/dL   Urobilinogen, UA 0.2  0.0 - 1.0 mg/dL   Nitrite NEGATIVE  NEGATIVE   Leukocytes, UA NEGATIVE  NEGATIVE   Comment: MICROSCOPIC NOT DONE ON URINES WITH NEGATIVE PROTEIN, BLOOD, LEUKOCYTES, NITRITE, OR GLUCOSE <1000 mg/dL.  PREPARE RBC  (CROSSMATCH)     Status: None   Collection Time    06/12/13  8:00 PM      Result Value Range   Order Confirmation ORDER PROCESSED BY BLOOD BANK    TYPE AND SCREEN     Status: None   Collection Time    06/12/13 10:44 PM      Result Value Range   ABO/RH(D) A POS     Antibody Screen NEG     Sample Expiration 06/15/2013     Unit Number [id number]     Blood Component Type RED CELLS,LR     Unit division 00     Status of Unit ALLOCATED     Transfusion Status OK TO TRANSFUSE     Crossmatch Result Compatible     Unit Number [id number]     Blood Component Type RED CELLS,LR     Unit division 00     Status of Unit ALLOCATED     Transfusion Status OK TO TRANSFUSE     Crossmatch Result Compatible    BASIC METABOLIC PANEL     Status: Abnormal   Collection Time    06/13/13  4:45 AM      Result Value Range   Sodium 136  135 - 145 mEq/L   Potassium 4.5  3.5 - 5.1 mEq/L   Comment: HEMOLYSIS AT THIS LEVEL MAY AFFECT RESULT     DELTA CHECK NOTED   Chloride 103  96 - 112 mEq/L   CO2 25  19 - 32 mEq/L   Glucose, Bld 108 (*) 70 - 99 mg/dL   BUN 13  6 - 23 mg/dL   Creatinine, Ser 5.36  0.50 - 1.10 mg/dL   Calcium 8.2 (*) 8.4 - 10.5 mg/dL   GFR calc non Af Amer 85 (*) >90 mL/min  GFR calc Af Amer >90  >90 mL/min   Comment: (NOTE)     The eGFR has been calculated using the CKD EPI equation.     This calculation has not been validated in all clinical situations.     eGFR's persistently <90 mL/min signify possible Chronic Kidney     Disease.  CBC     Status: Abnormal   Collection Time    06/13/13  4:45 AM      Result Value Range   WBC 7.6  4.0 - 10.5 K/uL   RBC 3.44 (*) 3.87 - 5.11 MIL/uL   Hemoglobin 11.4 (*) 12.0 - 15.0 g/dL   HCT 52.8 (*) 41.3 - 24.4 %   MCV 97.1  78.0 - 100.0 fL   MCH 33.1  26.0 - 34.0 pg   MCHC 34.1  30.0 - 36.0 g/dL   RDW 01.0  27.2 - 53.6 %   Platelets 220  150 - 400 K/uL  SURGICAL PCR SCREEN     Status: None   Collection Time    06/13/13  5:17 AM       Result Value Range   MRSA, PCR NEGATIVE  NEGATIVE   Staphylococcus aureus NEGATIVE  NEGATIVE   Comment:            The Xpert SA Assay (FDA     approved for NASAL specimens     in patients over 40 years of age),     is one component of     a comprehensive surveillance     program.  Test performance has     been validated by [employer] for patients greater     than or equal to 52 year old.     It is not intended     to diagnose infection nor to     guide or monitor treatment.  CBC     Status: Abnormal   Collection Time    06/14/13  6:47 AM      Result Value Range   WBC 9.5  4.0 - 10.5 K/uL   RBC 2.73 (*) 3.87 - 5.11 MIL/uL   Hemoglobin 9.2 (*) 12.0 - 15.0 g/dL   Comment: DELTA CHECK NOTED     REPEATED TO VERIFY   HCT 26.5 (*) 36.0 - 46.0 %   MCV 97.1  78.0 - 100.0 fL   MCH 33.7  26.0 - 34.0 pg   MCHC 34.7  30.0 - 36.0 g/dL   RDW 64.4  03.4 - 74.2 %   Platelets 185  150 - 400 K/uL    Studies/Results: Dg Chest 1 View  06/12/2013   CLINICAL DATA:  History of fall complaining of left sided pain.  EXAM: CHEST - 1 VIEW  COMPARISON:  Chest x-ray 12/17/2011.  FINDINGS: Lung volumes are normal. No consolidative airspace disease. No pleural effusions. Linear opacities in the base of the left hemithorax are likely to reflect areas of subsegmental atelectasis in the left lower lobe. No acute consolidative airspace disease. No pleural effusions. Surgical clips are seen projecting over the lower mediastinum, likely near the gastroesophageal junction. Heart size and mediastinal contours are within normal limits.  IMPRESSION: 1. Probable subsegmental atelectasis in the left lower lobe. In the setting of recent trauma with chest pain, this may be related to splinting.   Electronically Signed   By: Trudie Reed M.D.   On: 06/12/2013 17:49   Dg Hip Bilateral W/pelvis  06/12/2013   CLINICAL DATA:  History of fall complaining of pelvic pain, particularly in the left hip.  EXAM: BILATERAL  HIP WITH PELVIS - 4+ VIEW  COMPARISON:  No priors.  FINDINGS: Multiple views of the bony pelvis and left hip demonstrate an acute mildly displaced transcervical neck fracture of the left hip. The left femoral head remains properly located. There is approximately 4 mm of lateral displacement of the distal femoral fragment. No acute displaced fracture of the bony pelvic ring or right hip is noted.  IMPRESSION: 1. Acute mildly displaced transcervical femoral neck fracture of the left hip.   Electronically Signed   By: Trudie Reed M.D.   On: 06/12/2013 17:46   Ct Head Wo Contrast  06/12/2013   CLINICAL DATA:  History of trauma from a fall.  EXAM: CT HEAD WITHOUT CONTRAST  CT CERVICAL SPINE WITHOUT CONTRAST  TECHNIQUE: Multidetector CT imaging of the head and cervical spine was performed following the standard protocol without intravenous contrast. Multiplanar CT image reconstructions of the cervical spine were also generated.  COMPARISON:  Head and cervical spine CT scan 04/08/2013.  FINDINGS: CT HEAD FINDINGS  Mild cerebral atrophy. Patchy and confluent areas of decreased attenuation are noted throughout the deep and periventricular white matter of the cerebral hemispheres bilaterally, compatible with chronic microvascular ischemic disease. No acute displaced skull fractures are identified. No acute intracranial abnormality. Specifically, no evidence of acute post-traumatic intracranial hemorrhage, no definite regions of acute/subacute cerebral ischemia, no focal mass, mass effect, hydrocephalus or abnormal intra or extra-axial fluid collections. The visualized paranasal sinuses and mastoids are well pneumatized.  CT CERVICAL SPINE FINDINGS  No acute displaced fracture of the cervical spine. Reversal of normal cervical lordosis centered at the level of C3-C4. This is similar to prior study 04/08/2013, and presumably related to chronic degenerative disease. Alignment is otherwise anatomic. Multilevel  degenerative disc disease is noted, most severe at C4-C5. Mild multilevel facet arthropathy is also noted. Prevertebral soft tissues are normal. Visualized portions of the upper thorax are unremarkable.  IMPRESSION: 1. No evidence of significant acute traumatic injury to the head, brain or cervical spine. 2. Mild cerebral atrophy with chronic microvascular ischemic changes in the cerebral white matter. 3. Multilevel degenerative disc disease and cervical spondylosis redemonstrated, as above.   Electronically Signed   By: Trudie Reed M.D.   On: 06/12/2013 17:16   Ct Cervical Spine Wo Contrast  06/12/2013   CLINICAL DATA:  History of trauma from a fall.  EXAM: CT HEAD WITHOUT CONTRAST  CT CERVICAL SPINE WITHOUT CONTRAST  TECHNIQUE: Multidetector CT imaging of the head and cervical spine was performed following the standard protocol without intravenous contrast. Multiplanar CT image reconstructions of the cervical spine were also generated.  COMPARISON:  Head and cervical spine CT scan 04/08/2013.  FINDINGS: CT HEAD FINDINGS  Mild cerebral atrophy. Patchy and confluent areas of decreased attenuation are noted throughout the deep and periventricular white matter of the cerebral hemispheres bilaterally, compatible with chronic microvascular ischemic disease. No acute displaced skull fractures are identified. No acute intracranial abnormality. Specifically, no evidence of acute post-traumatic intracranial hemorrhage, no definite regions of acute/subacute cerebral ischemia, no focal mass, mass effect, hydrocephalus or abnormal intra or extra-axial fluid collections. The visualized paranasal sinuses and mastoids are well pneumatized.  CT CERVICAL SPINE FINDINGS  No acute displaced fracture of the cervical spine. Reversal of normal cervical lordosis centered at the level of C3-C4. This is similar to prior study 04/08/2013, and presumably related to chronic degenerative disease. Alignment is  otherwise anatomic.  Multilevel degenerative disc disease is noted, most severe at C4-C5. Mild multilevel facet arthropathy is also noted. Prevertebral soft tissues are normal. Visualized portions of the upper thorax are unremarkable.  IMPRESSION: 1. No evidence of significant acute traumatic injury to the head, brain or cervical spine. 2. Mild cerebral atrophy with chronic microvascular ischemic changes in the cerebral white matter. 3. Multilevel degenerative disc disease and cervical spondylosis redemonstrated, as above.   Electronically Signed   By: Trudie Reed M.D.   On: 06/12/2013 17:16   Dg Pelvis Portable  06/13/2013   CLINICAL DATA:  Left hip replacement.  EXAM: PORTABLE PELVIS 1-2 VIEWS  COMPARISON:  Hip series 12 ,22,014.  FINDINGS: Portable supine view of the left hip reveals good anatomic location of the left hip replacement. No acute bony abnormality. Soft tissue air from prior surgery present.  IMPRESSION: Left hip replacement with good anatomic alignment on AP view.   Electronically Signed   By: Maisie Fus  Register   On: 06/13/2013 11:49   Dg Shoulder Left  06/12/2013   CLINICAL DATA:  History of fall complaining of left shoulder pain.  EXAM: LEFT SHOULDER - 2+ VIEW  COMPARISON:  No priors.  FINDINGS: Three views of the left shoulder demonstrate no acute displaced fracture or dislocation. The humeral head is high riding, likely related to underlying rotator cuff pathology. Degenerative changes of osteoarthritis are noted in the glenohumeral and acromioclavicular joints.  IMPRESSION: 1. No acute radiographic abnormality of the left shoulder. 2. Findings compatible with rotator cuff disease and osteoarthritis in the glenohumeral and acromioclavicular joints.   Electronically Signed   By: Trudie Reed M.D.   On: 06/12/2013 17:47   Dg Hip Portable 1 View Left  06/13/2013   CLINICAL DATA:  Hip replacement.  EXAM: PORTABLE LEFT HIP - 1 VIEW  COMPARISON:  AP pelvis 06/13/2013.  FINDINGS: Patient status post  left hip replacement with good anatomic alignment on cross-table lateral view.  IMPRESSION: Left hip replacement.  Good anatomic alignment.   Electronically Signed   By: Maisie Fus  Register   On: 06/13/2013 11:54   Medications: Scheduled Meds: . aspirin EC  81 mg Oral Daily  . atorvastatin  20 mg Oral q1800  . cholecalciferol  2,000 Units Oral Daily  . docusate sodium  100 mg Oral BID  . enoxaparin (LOVENOX) injection  40 mg Subcutaneous Q24H  . ferrous gluconate  324 mg Oral Q breakfast  . metoprolol  50 mg Oral Daily  . metoprolol tartrate  25 mg Oral QHS  . pantoprazole  80 mg Oral Daily  . pneumococcal 23 valent vaccine  0.5 mL Intramuscular Tomorrow-1000  . senna  1 tablet Oral BID  . valACYclovir  1,000 mg Oral QHS  . venlafaxine XR  75 mg Oral QHS   Continuous Infusions: . 0.45 % NaCl with KCl 20 mEq / L 75 mL/hr at 06/13/13 1339   PRN Meds:.acetaminophen, acetaminophen, ALPRAZolam, alum & mag hydroxide-simeth, bisacodyl, HYDROcodone-acetaminophen, HYDROmorphone (DILAUDID) injection, menthol-cetylpyridinium, metoCLOPramide (REGLAN) injection, metoCLOPramide, ondansetron (ZOFRAN) IV, ondansetron, phenol, polyethylene glycol, senna, zolpidem  Assessment/Plan: Principal Problem:   Fracture of femoral neck, left - progressing well. expected discharged to rehabilitation tomorrow Active Problems:   Hyperlipidemia   Hypertension   Lumbar radiculopathy, chronic   Leukocytosis, unspecified   S/P Nissen fundoplication (without gastrostomy tube) procedure   Lactose intolerance   PVCs (premature ventricular contractions)   Postoperative anemia and iron deficiency anemia - hemoglobin up to 11.2 after transfusion  LOS: 2 days   Kristin Dubonnet, MD 06/14/2013, 8:04 AM

## 2013-06-14 NOTE — Progress Notes (Signed)
Utilization review completed.  

## 2013-06-14 NOTE — Progress Notes (Signed)
Clinical Social Work Department BRIEF PSYCHOSOCIAL ASSESSMENT 06/14/2013  Patient:  Kristin Solomon, Kristin Solomon     Account Number:  [id number]     Admit date:  06/12/2013  Clinical Social Worker:  Hendricks Milo  Date/Time:  06/14/2013 06:34 PM  Referred by:  Physician  Date Referred:  06/13/2013 Referred for  SNF Placement   Other Referral:   Interview type:  Patient Other interview type:    PSYCHOSOCIAL DATA Living Status:  FACILITY Admitted from facility:  [facility] Level of care:  Independent Living Primary support name:  Dawnn [phone] Primary support relationship to patient:  CHILD, ADULT Degree of support available:   Very supportive at bedside during assessment.    CURRENT CONCERNS  Other Concerns:    SOCIAL WORK ASSESSMENT / PLAN Clinical Social Worker (CSW) met with patient to discuss SNF placement. Patient reported that she lives in the Independent Living section of [facility]. Patient stated that she would like to go to the skilled nursing facility at [facility] for short term rehab.   Assessment/plan status:  Psychosocial Support/Ongoing Assessment of Needs Other assessment/ plan:   Information/referral to community resources:    PATIENT'S/FAMILY'S RESPONSE TO PLAN OF CARE: Patient and daughter thanked CSW for visit.

## 2013-06-14 NOTE — Progress Notes (Signed)
     Subjective:  Patient reports pain as mild.  No complaints.   Wants to go to [facility] on wed.  Objective:   VITALS:   Filed Vitals:   06/13/13 1618 06/13/13 2100 06/14/13 0147 06/14/13 0500  BP: 108/54 107/56 125/61 133/54  Pulse: 98 88 87 96  Temp: 100.2 F (37.9 C) 98.1 F (36.7 C) 97.9 F (36.6 C) 97.6 F (36.4 C)  TempSrc: Oral Oral Oral Oral  Resp: 18 20 20 20   Height:      Weight:      SpO2: 97% 95% 95% 97%    Neurologically intact Dorsiflexion/Plantar flexion intact Incision: dressing C/D/I and no drainage   Lab Results  Component Value Date   WBC 9.5 06/14/2013   HGB 9.2* 06/14/2013   HCT 26.5* 06/14/2013   MCV 97.1 06/14/2013   PLT 185 06/14/2013     Assessment/Plan: 1 Day Post-Op   Principal Problem:   Fracture of femoral neck, left Active Problems:   Hyperlipidemia   Hypertension   Lumbar radiculopathy, chronic   Leukocytosis, unspecified   S/P Nissen fundoplication (without gastrostomy tube) procedure   Lactose intolerance   PVCs (premature ventricular contractions)   Advance diet Up with therapy Discharge to SNF to [facility] on wed.   Lovenox. wbat.   Micky Sheller P 06/14/2013, 8:47 AM   Teryl Lucy, MD Cell [phone]

## 2013-06-14 NOTE — Evaluation (Signed)
Occupational Therapy Evaluation Patient Details Name: Kristin Solomon MRN: [id number] DOB: 09/04/1929 Today's Date: 06/14/2013 Time: 0347-4259 OT Time Calculation (min): 27 min  OT Assessment / Plan / Recommendation History of present illness Pt is an 79 y/o female admitted s/p fall, and is now s/p left hip hemiarthroplasty.    Clinical Impression   Pt presents with below problem list. Pt independent with ADLs, PTA. Pt will benefit from acute OT to increase independence prior to d/c.     OT Assessment  Patient needs continued OT Services    Follow Up Recommendations  SNF;Supervision/Assistance - 24 hour    Barriers to Discharge      Equipment Recommendations  Other (comment) (defer to next venue)    Recommendations for Other Services    Frequency  Min 2X/week    Precautions / Restrictions Precautions Precautions: Fall;Posterior Hip Precaution Booklet Issued: No Precaution Comments: Reviewed 3/3 hip precautions. Restrictions Weight Bearing Restrictions: Yes LLE Weight Bearing: Weight bearing as tolerated   Pertinent Vitals/Pain Pain 3-4/10 in Lt shoulder and hip. Repositioned. Performed UE exercises.     ADL  Grooming: Wash/dry hands;Min guard Where Assessed - Grooming: Supported standing Upper Body Dressing: Set up Where Assessed - Upper Body Dressing: Supported sitting Lower Body Dressing: Minimal assistance Where Assessed - Lower Body Dressing: Supported sit to Pharmacist, hospital: Min Sports administrator Method: Sit to Barista: Raised toilet seat with arms (or 3-in-1 over toilet) Toileting - Clothing Manipulation and Hygiene: Min guard Where Assessed - Toileting Clothing Manipulation and Hygiene: Sit to stand from 3-in-1 or toilet Tub/Shower Transfer Method: Not assessed Equipment Used: Gait belt;Rolling walker;Reacher;Long-handled sponge;Sock aid Transfers/Ambulation Related to ADLs: Min guard for ambulation.   ADL Comments: Educated on AE for LB ADLs and pt practiced with sockaid and reacher. OT educated on LUE exercises and had pt perform AAROM to LUE (OT also assisted in raising arm). Educated that supine position should be easier. Educated to sit to bathe on chair and also to sit for dressing. Educated on dressing technique and use of button up shirts.   OT Diagnosis: Acute pain  OT Problem List: Decreased strength;Decreased range of motion;Decreased activity tolerance;Impaired balance (sitting and/or standing);Decreased knowledge of use of DME or AE;Decreased knowledge of precautions;Pain;Impaired UE functional use OT Treatment Interventions: Self-care/ADL training;Therapeutic exercise;DME and/or AE instruction;Therapeutic activities;Balance training;Patient/family education   OT Goals(Current goals can be found in the care plan section) Acute Rehab OT Goals Patient Stated Goal: not stated OT Goal Formulation: With patient Time For Goal Achievement: 06/21/13 Potential to Achieve Goals: Good ADL Goals Pt Will Perform Lower Body Bathing: with modified independence;sit to/from stand;with adaptive equipment Pt Will Perform Lower Body Dressing: with modified independence;with adaptive equipment;sit to/from stand Pt Will Transfer to Toilet: with modified independence;ambulating Pt Will Perform Toileting - Clothing Manipulation and hygiene: with modified independence;sit to/from stand Additional ADL Goal #1: Pt will be independent with HEP for LUE to increase strength and ROM.  Additional ADL Goal #2: Pt will independently verbalize and demonstrate 3/3 hip precautions.   Visit Information  Last OT Received On: 06/14/13 Assistance Needed: +1 History of Present Illness: Pt is an 79 y/o female admitted s/p fall, and is now s/p left hip hemiarthroplasty.        Prior Functioning     Home Living Family/patient expects to be discharged to:: Skilled nursing facility Additional Comments: Currently  in Assisted Living - [facility]  Prior Function Level of Independence: Independent  with assistive device(s) Comments: Uses a 4-wheeled walker; has reacher, long sponge, and sockaid Communication Communication: No difficulties Dominant Hand: Right         Vision/Perception     Cognition  Cognition Arousal/Alertness: Awake/alert Behavior During Therapy: WFL for tasks assessed/performed Overall Cognitive Status: Within Functional Limits for tasks assessed    Extremity/Trunk Assessment Upper Extremity Assessment Upper Extremity Assessment: LUE deficits/detail LUE Deficits / Details: Limited AROM in LUE Lower Extremity Assessment Lower Extremity Assessment: Defer to PT evaluation LLE Deficits / Details: Decreased strength and AROM consistent with L hemiarthroplasty     Mobility Bed Mobility Bed Mobility: Supine to Sit; Scoot to Edge of Bed Scoot to Edge of Bed: 4: Min guard Supine to Sit: 4: Min assist Details for Bed Mobility Assistance: Assisted with trunk.  Transfers Transfers: Sit to Stand;Stand to Sit Sit to Stand: 4: Min guard;With upper extremity assist;From chair/3-in-1;From bed Stand to Sit: 4: Min guard;4: Min assist;With upper extremity assist;To chair/3-in-1 Details for Transfer Assistance: Assist to position LLE with stand to sit transfer to 3 in 1. Cues for technique.           End of Session OT - End of Session Equipment Utilized During Treatment: Gait belt;Rolling walker Activity Tolerance: Patient tolerated treatment well Patient left: in chair;with call bell/phone within reach  [employer] OTR/L [phone] 06/14/2013, 12:46 PM

## 2013-06-14 NOTE — Evaluation (Signed)
Physical Therapy Evaluation Patient Details Name: Kristin Solomon MRN: [id number] DOB: May 22, 1930 Today's Date: 06/14/2013 Time: 4098-1191 PT Time Calculation (min): 40 min  PT Assessment / Plan / Recommendation History of Present Illness  Pt is an 79 y/o female admitted s/p fall, and is now s/p left hip hemiarthroplasty.   Clinical Impression  This patient presents with acute pain and decreased functional independence following the above mentioned procedure. Pt from assisted living, but reports that she was mainly independent as long as she had her 4-wheeled walker. At the time of PT eval, pt required min to mod assist for functional mobility, and to recover from minor LOB while washing hands in bathroom. This patient is appropriate for skilled PT interventions to address functional limitations, improve safety and independence with functional mobility, and return to PLOF.     PT Assessment  Patient needs continued PT services    Follow Up Recommendations  SNF    Does the patient have the potential to tolerate intense rehabilitation      Barriers to Discharge        Equipment Recommendations  None recommended by PT (Equipment TBD by next venue of care)    Recommendations for Other Services     Frequency 7X/week    Precautions / Restrictions Precautions Precautions: Fall;Posterior Hip Precaution Booklet Issued: No Precaution Comments: Discussed 3/3 hip precautions throughout session.  Restrictions Weight Bearing Restrictions: Yes   Pertinent Vitals/Pain 4/10 at rest      Mobility  Bed Mobility Bed Mobility: Sit to Supine;Scooting to [facility] Sit to Supine: 3: Mod assist;HOB flat;With rail Scooting to [facility]: 4: Min assist;With trapeze Details for Bed Mobility Assistance: VC's for sequencing and technique. Assist to lift LE's back into bed.  Transfers Transfers: Sit to Stand;Stand to Sit Sit to Stand: 3: Mod assist;From chair/3-in-1;With upper extremity assist Stand to  Sit: 4: Min guard;To bed;With upper extremity assist Details for Transfer Assistance: VC's for hand placement on seated surface.  Ambulation/Gait Ambulation/Gait Assistance: 4: Min assist Ambulation Distance (Feet): 75 Feet Assistive device: Rolling walker Ambulation/Gait Assistance Details: VC's for sequencing and safety awareness with the RW. Pt was able to progress to step-through gait pattern by end of gait training. One small LOB while in bathroom, with min assist to recover.  Gait Pattern: Step-through pattern;Decreased stride length Gait velocity: Decreased Stairs: No Wheelchair Mobility Wheelchair Mobility: No    Exercises Total Joint Exercises Ankle Circles/Pumps: 10 reps Quad Sets: 10 reps Towel Squeeze: 10 reps Heel Slides: 10 reps Hip ABduction/ADduction: 10 reps   PT Diagnosis: Difficulty walking;Acute pain  PT Problem List: Decreased strength;Decreased range of motion;Decreased activity tolerance;Decreased mobility;Decreased balance;Decreased knowledge of use of DME;Decreased safety awareness;Decreased knowledge of precautions;Pain PT Treatment Interventions: Stair training;DME instruction;Gait training;Functional mobility training     PT Goals(Current goals can be found in the care plan section) Acute Rehab PT Goals Patient Stated Goal: To d/c to SNF for STR PT Goal Formulation: With patient Time For Goal Achievement: 06/21/13 Potential to Achieve Goals: Good  Visit Information  Last PT Received On: 06/14/13 Assistance Needed: +1 History of Present Illness: Pt is an 79 y/o female admitted s/p fall, and is now s/p left hip hemiarthroplasty.        Prior Functioning  Home Living Family/patient expects to be discharged to:: Skilled nursing facility ([facility]) Additional Comments: Currently in Assisted Living - [facility]  Prior Function Level of Independence: Independent with assistive device(s) Comments: Uses a 4-wheeled  walker Communication Communication: No difficulties Dominant Hand: Right    Cognition  Cognition Arousal/Alertness: Awake/alert Behavior During Therapy: WFL for tasks assessed/performed Overall Cognitive Status: Within Functional Limits for tasks assessed    Extremity/Trunk Assessment Upper Extremity Assessment Upper Extremity Assessment: Defer to OT evaluation Lower Extremity Assessment Lower Extremity Assessment: LLE deficits/detail LLE Deficits / Details: Decreased strength and AROM consistent with L hemiarthroplasty Cervical / Trunk Assessment Cervical / Trunk Assessment: Kyphotic   Balance Balance Balance Assessed: Yes Static Sitting Balance Static Sitting - Balance Support: Feet supported;Bilateral upper extremity supported Static Sitting - Level of Assistance: 6: Modified independent (Device/Increase time) Static Standing Balance Static Standing - Balance Support: Bilateral upper extremity supported Static Standing - Level of Assistance: 4: Min assist Dynamic Standing Balance Dynamic Standing - Balance Support: No upper extremity supported;During functional activity Dynamic Standing - Level of Assistance: 4: Min assist Dynamic Standing - Balance Activities: Lateral lean/weight shifting;Forward lean/weight shifting Dynamic Standing - Comments: During hand washing at sink in bathroom  End of Session PT - End of Session Equipment Utilized During Treatment: Gait belt Activity Tolerance: Patient tolerated treatment well Patient left: in bed;with call bell/phone within reach Nurse Communication: Mobility status  GP     Ruthann Cancer 06/14/2013, 12:26 PM  Ruthann Cancer, PT, DPT [phone]

## 2013-06-15 LAB — BASIC METABOLIC PANEL
BUN: 10 mg/dL (ref 6–23)
CO2: 25 mEq/L (ref 19–32)
Chloride: 102 mEq/L (ref 96–112)
Creatinine, Ser: 0.54 mg/dL (ref 0.50–1.10)
Glucose, Bld: 111 mg/dL — ABNORMAL HIGH (ref 70–99)
Potassium: 4.3 mEq/L (ref 3.5–5.1)

## 2013-06-15 LAB — CBC
MCV: 98.8 fL (ref 78.0–100.0)
Platelets: 163 10*3/uL (ref 150–400)
RBC: 2.59 MIL/uL — ABNORMAL LOW (ref 3.87–5.11)
RDW: 12.6 % (ref 11.5–15.5)
WBC: 8.7 10*3/uL (ref 4.0–10.5)

## 2013-06-15 MED ORDER — ALUM & MAG HYDROXIDE-SIMETH 200-200-20 MG/5ML PO SUSP: 30.0000 mL | ORAL | Status: DC | PRN

## 2013-06-15 MED ORDER — DSS 100 MG PO CAPS: 100.0000 mg | ORAL_CAPSULE | Freq: Two times a day (BID) | ORAL | Status: DC

## 2013-06-15 MED ORDER — MENTHOL 3 MG MT LOZG: 1.0000 | LOZENGE | OROMUCOSAL | Status: DC | PRN

## 2013-06-15 MED ORDER — BISACODYL 10 MG RE SUPP: 10.0000 mg | Freq: Every day | RECTAL | Status: DC | PRN

## 2013-06-15 MED ORDER — HYDROCODONE-ACETAMINOPHEN 10-325 MG PO TABS: 1.0000 | ORAL_TABLET | Freq: Four times a day (QID) | ORAL | Status: DC | PRN

## 2013-06-15 NOTE — Discharge Summary (Signed)
Physician Discharge Summary  NAME:Kristin Solomon  ZOX:[id number]  DOB: 1929-09-12   Admit date: 06/12/2013 Discharge date: 06/15/2013  Discharge Diagnoses:  Principal Problem:   Fracture of femoral neck, left - continue PT OT and skilled nursing facility Active Problems:   Hyperlipidemia   Hypertension   Lumbar radiculopathy, chronic   Leukocytosis, unspecified - resolved - likely secondary to stress of her fracture   S/P Nissen fundoplication (without gastrostomy tube) procedure   Lactose intolerance   PVCs (premature ventricular contractions)   Anemia - history of iron deficiency and also likely secondary to fracture and loss secondary to surgery   Discharge Physical Exam:  General Appearance: Alert, cooperative, no distress, appears stated age  Weight change:   Intake/Output Summary (Last 24 hours) at 06/15/13 0825 Last data filed at 06/14/13 1700  Gross per 24 hour  Intake 1266.25 ml  Output    225 ml  Net 1041.25 ml   Filed Vitals:   06/14/13 2105 06/15/13 0000 06/15/13 0400 06/15/13 0614  BP: 130/51   149/53  Pulse: 99   97  Temp: 99.4 F (37.4 C)   98.4 F (36.9 C)  TempSrc: Oral   Oral  Resp: 18 18 18 18   Height:      Weight:      SpO2: 97% 97% 97% 98%    General appearance: alert and cooperative  Resp: clear to auscultation bilaterally  Cardio: regular rate and rhythm, S1, S2 normal, no murmur, click, rub or gallop  Abdomen: Soft nontender bowel sounds normal  Extremities: Left hip wound dry with minimal bruising. It is sore when she raises her left arm above the horizontal in the left shoulder area.  Neurological: Oriented x3 nonfocal      Discharge Condition: Improved  Hospital Course: Kristin Solomon is a pleasant 79 year old female who fell on December 20 and a retail store when her pain slip and she tripped. She fell on to her left side and fractured her left hip and injured her right shoulder that she does not have a fracture of the  shoulder. She underwent left hip arthroplasty by Dr. Teryl Lucy and she also had an injection to her left shoulder with steroid secondary to soft tissue injury. She is improving nicely since surgery and will plan for discharge December 24 to a skilled nursing facility for rehabilitation  Things to follow up in the outpatient setting: Pain control, blood pressure and left shoulder pain and to continue PT OT for left shoulder and left hip fracture  Consults: Treatment Team:  Eulas Post, MD  Disposition: 01-Home or Self Care  Discharge Orders   Future Orders Complete By Expires   Call MD for:  difficulty breathing, headache or visual disturbances  As directed    Call MD for:  persistant nausea and vomiting  As directed    Call MD for:  severe uncontrolled pain  As directed    Call MD for:  temperature >100.4  As directed    Diet - low sodium heart healthy  As directed    Increase activity slowly  As directed    Posterior total hip precautions  As directed    Weight bearing as tolerated  As directed    Questions:     Laterality:     Extremity:         Medication List         acetaminophen 500 MG tablet  Commonly known as:  TYLENOL  Take 500 mg by  mouth every 6 (six) hours as needed for pain.     ALPRAZolam 0.5 MG tablet  Commonly known as:  XANAX  Take 0.25 mg by mouth at bedtime as needed for sleep.     alum & mag hydroxide-simeth 200-200-20 MG/5ML suspension  Commonly known as:  MAALOX/MYLANTA  Take 30 mLs by mouth every 4 (four) hours as needed for indigestion.     aspirin 81 MG tablet  Take 81 mg by mouth daily.     bisacodyl 10 MG suppository  Commonly known as:  DULCOLAX  Place 1 suppository (10 mg total) rectally daily as needed for moderate constipation.     cholecalciferol 1000 UNITS tablet  Commonly known as:  VITAMIN D  Take 2,000 Units by mouth daily.     DSS 100 MG Caps  Take 100 mg by mouth 2 (two) times daily.     enoxaparin 40 MG/0.4ML  injection  Commonly known as:  LOVENOX  Inject 0.4 mLs (40 mg total) into the skin daily.     HYDROcodone-acetaminophen 10-325 MG per tablet  Commonly known as:  NORCO  Take 1-2 tablets by mouth every 6 (six) hours as needed.     HYDROcodone-acetaminophen 10-325 MG per tablet  Commonly known as:  NORCO  Take 1-2 tablets by mouth every 6 (six) hours as needed for severe pain.     Iron 240 (27 FE) MG Tabs  Take 1 tablet by mouth daily.     menthol-cetylpyridinium 3 MG lozenge  Commonly known as:  CEPACOL  Take 1 lozenge (3 mg total) by mouth as needed for sore throat (sore throat).     metoprolol 50 MG tablet  Commonly known as:  LOPRESSOR  Take 25-50 mg by mouth 2 (two) times daily. Take 50 MG in the morning and take 25 MG at bedtime.     omeprazole 40 MG capsule  Commonly known as:  PRILOSEC  Take 40 mg by mouth 2 (two) times daily.     rosuvastatin 10 MG tablet  Commonly known as:  CRESTOR  Take 10 mg by mouth daily.     senna 8.6 MG Tabs tablet  Commonly known as:  SENOKOT  Take 1 tablet by mouth at bedtime as needed for mild constipation.     sennosides-docusate sodium 8.6-50 MG tablet  Commonly known as:  SENOKOT-S  Take 2 tablets by mouth daily.     valACYclovir 500 MG tablet  Commonly known as:  VALTREX  Take 1,000 mg by mouth at bedtime.     venlafaxine XR 75 MG 24 hr capsule  Commonly known as:  EFFEXOR-XR  Take 75 mg by mouth at bedtime.           Follow-up Information   Follow up with Eulas Post, MD. Schedule an appointment as soon as possible for a visit in 2 weeks.   Specialty:  Orthopedic Surgery   Contact information:   [address] [place] [address] [phone]       The results of significant diagnostics from this hospitalization (including imaging, microbiology, ancillary and laboratory) are listed below for reference.    Significant Diagnostic Studies: Dg Chest 1 View  06/12/2013   CLINICAL DATA:  History  of fall complaining of left sided pain.  EXAM: CHEST - 1 VIEW  COMPARISON:  Chest x-ray 12/17/2011.  FINDINGS: Lung volumes are normal. No consolidative airspace disease. No pleural effusions. Linear opacities in the base of the left hemithorax are likely to  reflect areas of subsegmental atelectasis in the left lower lobe. No acute consolidative airspace disease. No pleural effusions. Surgical clips are seen projecting over the lower mediastinum, likely near the gastroesophageal junction. Heart size and mediastinal contours are within normal limits.  IMPRESSION: 1. Probable subsegmental atelectasis in the left lower lobe. In the setting of recent trauma with chest pain, this may be related to splinting.   Electronically Signed   By: Trudie Reed M.D.   On: 06/12/2013 17:49   Dg Hip Bilateral W/pelvis  06/12/2013   CLINICAL DATA:  History of fall complaining of pelvic pain, particularly in the left hip.  EXAM: BILATERAL HIP WITH PELVIS - 4+ VIEW  COMPARISON:  No priors.  FINDINGS: Multiple views of the bony pelvis and left hip demonstrate an acute mildly displaced transcervical neck fracture of the left hip. The left femoral head remains properly located. There is approximately 4 mm of lateral displacement of the distal femoral fragment. No acute displaced fracture of the bony pelvic ring or right hip is noted.  IMPRESSION: 1. Acute mildly displaced transcervical femoral neck fracture of the left hip.   Electronically Signed   By: Trudie Reed M.D.   On: 06/12/2013 17:46   Ct Head Wo Contrast  06/12/2013   CLINICAL DATA:  History of trauma from a fall.  EXAM: CT HEAD WITHOUT CONTRAST  CT CERVICAL SPINE WITHOUT CONTRAST  TECHNIQUE: Multidetector CT imaging of the head and cervical spine was performed following the standard protocol without intravenous contrast. Multiplanar CT image reconstructions of the cervical spine were also generated.  COMPARISON:  Head and cervical spine CT scan 04/08/2013.   FINDINGS: CT HEAD FINDINGS  Mild cerebral atrophy. Patchy and confluent areas of decreased attenuation are noted throughout the deep and periventricular white matter of the cerebral hemispheres bilaterally, compatible with chronic microvascular ischemic disease. No acute displaced skull fractures are identified. No acute intracranial abnormality. Specifically, no evidence of acute post-traumatic intracranial hemorrhage, no definite regions of acute/subacute cerebral ischemia, no focal mass, mass effect, hydrocephalus or abnormal intra or extra-axial fluid collections. The visualized paranasal sinuses and mastoids are well pneumatized.  CT CERVICAL SPINE FINDINGS  No acute displaced fracture of the cervical spine. Reversal of normal cervical lordosis centered at the level of C3-C4. This is similar to prior study 04/08/2013, and presumably related to chronic degenerative disease. Alignment is otherwise anatomic. Multilevel degenerative disc disease is noted, most severe at C4-C5. Mild multilevel facet arthropathy is also noted. Prevertebral soft tissues are normal. Visualized portions of the upper thorax are unremarkable.  IMPRESSION: 1. No evidence of significant acute traumatic injury to the head, brain or cervical spine. 2. Mild cerebral atrophy with chronic microvascular ischemic changes in the cerebral white matter. 3. Multilevel degenerative disc disease and cervical spondylosis redemonstrated, as above.   Electronically Signed   By: Trudie Reed M.D.   On: 06/12/2013 17:16   Ct Cervical Spine Wo Contrast  06/12/2013   CLINICAL DATA:  History of trauma from a fall.  EXAM: CT HEAD WITHOUT CONTRAST  CT CERVICAL SPINE WITHOUT CONTRAST  TECHNIQUE: Multidetector CT imaging of the head and cervical spine was performed following the standard protocol without intravenous contrast. Multiplanar CT image reconstructions of the cervical spine were also generated.  COMPARISON:  Head and cervical spine CT scan  04/08/2013.  FINDINGS: CT HEAD FINDINGS  Mild cerebral atrophy. Patchy and confluent areas of decreased attenuation are noted throughout the deep and periventricular white matter of the cerebral hemispheres  bilaterally, compatible with chronic microvascular ischemic disease. No acute displaced skull fractures are identified. No acute intracranial abnormality. Specifically, no evidence of acute post-traumatic intracranial hemorrhage, no definite regions of acute/subacute cerebral ischemia, no focal mass, mass effect, hydrocephalus or abnormal intra or extra-axial fluid collections. The visualized paranasal sinuses and mastoids are well pneumatized.  CT CERVICAL SPINE FINDINGS  No acute displaced fracture of the cervical spine. Reversal of normal cervical lordosis centered at the level of C3-C4. This is similar to prior study 04/08/2013, and presumably related to chronic degenerative disease. Alignment is otherwise anatomic. Multilevel degenerative disc disease is noted, most severe at C4-C5. Mild multilevel facet arthropathy is also noted. Prevertebral soft tissues are normal. Visualized portions of the upper thorax are unremarkable.  IMPRESSION: 1. No evidence of significant acute traumatic injury to the head, brain or cervical spine. 2. Mild cerebral atrophy with chronic microvascular ischemic changes in the cerebral white matter. 3. Multilevel degenerative disc disease and cervical spondylosis redemonstrated, as above.   Electronically Signed   By: Trudie Reed M.D.   On: 06/12/2013 17:16   Dg Pelvis Portable  06/13/2013   CLINICAL DATA:  Left hip replacement.  EXAM: PORTABLE PELVIS 1-2 VIEWS  COMPARISON:  Hip series 12 ,22,014.  FINDINGS: Portable supine view of the left hip reveals good anatomic location of the left hip replacement. No acute bony abnormality. Soft tissue air from prior surgery present.  IMPRESSION: Left hip replacement with good anatomic alignment on AP view.   Electronically Signed    By: Maisie Fus  Register   On: 06/13/2013 11:49   Dg Shoulder Left  06/12/2013   CLINICAL DATA:  History of fall complaining of left shoulder pain.  EXAM: LEFT SHOULDER - 2+ VIEW  COMPARISON:  No priors.  FINDINGS: Three views of the left shoulder demonstrate no acute displaced fracture or dislocation. The humeral head is high riding, likely related to underlying rotator cuff pathology. Degenerative changes of osteoarthritis are noted in the glenohumeral and acromioclavicular joints.  IMPRESSION: 1. No acute radiographic abnormality of the left shoulder. 2. Findings compatible with rotator cuff disease and osteoarthritis in the glenohumeral and acromioclavicular joints.   Electronically Signed   By: Trudie Reed M.D.   On: 06/12/2013 17:47   Dg Hip Portable 1 View Left  06/13/2013   CLINICAL DATA:  Hip replacement.  EXAM: PORTABLE LEFT HIP - 1 VIEW  COMPARISON:  AP pelvis 06/13/2013.  FINDINGS: Patient status post left hip replacement with good anatomic alignment on cross-table lateral view.  IMPRESSION: Left hip replacement.  Good anatomic alignment.   Electronically Signed   By: Maisie Fus  Register   On: 06/13/2013 11:54    Microbiology: Recent Results (from the past 240 hour(s))  SURGICAL PCR SCREEN     Status: None   Collection Time    06/13/13  5:17 AM      Result Value Range Status   MRSA, PCR NEGATIVE  NEGATIVE Final   Staphylococcus aureus NEGATIVE  NEGATIVE Final   Comment:            The Xpert SA Assay (FDA     approved for NASAL specimens     in patients over 63 years of age),     is one component of     a comprehensive surveillance     program.  Test performance has     been validated by [employer] for patients greater     than or equal to 1  year old.     It is not intended     to diagnose infection nor to     guide or monitor treatment.     Labs: Results for orders placed during the hospital encounter of 06/12/13  SURGICAL PCR SCREEN      Result Value Range    MRSA, PCR NEGATIVE  NEGATIVE   Staphylococcus aureus NEGATIVE  NEGATIVE  CBC WITH DIFFERENTIAL      Result Value Range   WBC 12.0 (*) 4.0 - 10.5 K/uL   RBC 3.89  3.87 - 5.11 MIL/uL   Hemoglobin 13.0  12.0 - 15.0 g/dL   HCT 14.7  82.9 - 56.2 %   MCV 96.7  78.0 - 100.0 fL   MCH 33.4  26.0 - 34.0 pg   MCHC 34.6  30.0 - 36.0 g/dL   RDW 13.0  86.5 - 78.4 %   Platelets 245  150 - 400 K/uL   Neutrophils Relative % 72  43 - 77 %   Neutro Abs 8.6 (*) 1.7 - 7.7 K/uL   Lymphocytes Relative 18  12 - 46 %   Lymphs Abs 2.2  0.7 - 4.0 K/uL   Monocytes Relative 9  3 - 12 %   Monocytes Absolute 1.0  0.1 - 1.0 K/uL   Eosinophils Relative 1  0 - 5 %   Eosinophils Absolute 0.1  0.0 - 0.7 K/uL   Basophils Relative 0  0 - 1 %   Basophils Absolute 0.0  0.0 - 0.1 K/uL  COMPREHENSIVE METABOLIC PANEL      Result Value Range   Sodium 137  135 - 145 mEq/L   Potassium 3.7  3.5 - 5.1 mEq/L   Chloride 100  96 - 112 mEq/L   CO2 26  19 - 32 mEq/L   Glucose, Bld 96  70 - 99 mg/dL   BUN 16  6 - 23 mg/dL   Creatinine, Ser 6.96  0.50 - 1.10 mg/dL   Calcium 9.3  8.4 - 29.5 mg/dL   Total Protein 7.1  6.0 - 8.3 g/dL   Albumin 4.0  3.5 - 5.2 g/dL   AST 25  0 - 37 U/L   ALT 20  0 - 35 U/L   Alkaline Phosphatase 73  39 - 117 U/L   Total Bilirubin 0.3  0.3 - 1.2 mg/dL   GFR calc non Af Amer 79 (*) >90 mL/min   GFR calc Af Amer >90  >90 mL/min  PRO B NATRIURETIC PEPTIDE      Result Value Range   Pro B Natriuretic peptide (BNP) 172.8  0 - 450 pg/mL  URINALYSIS, ROUTINE W REFLEX MICROSCOPIC      Result Value Range   Color, Urine YELLOW  YELLOW   APPearance CLEAR  CLEAR   Specific Gravity, Urine 1.009  1.005 - 1.030   pH 7.0  5.0 - 8.0   Glucose, UA NEGATIVE  NEGATIVE mg/dL   Hgb urine dipstick NEGATIVE  NEGATIVE   Bilirubin Urine NEGATIVE  NEGATIVE   Ketones, ur NEGATIVE  NEGATIVE mg/dL   Protein, ur NEGATIVE  NEGATIVE mg/dL   Urobilinogen, UA 0.2  0.0 - 1.0 mg/dL   Nitrite NEGATIVE  NEGATIVE   Leukocytes,  UA NEGATIVE  NEGATIVE  BASIC METABOLIC PANEL      Result Value Range   Sodium 136  135 - 145 mEq/L   Potassium 4.5  3.5 - 5.1 mEq/L   Chloride 103  96 - 112 mEq/L  CO2 25  19 - 32 mEq/L   Glucose, Bld 108 (*) 70 - 99 mg/dL   BUN 13  6 - 23 mg/dL   Creatinine, Ser 1.61  0.50 - 1.10 mg/dL   Calcium 8.2 (*) 8.4 - 10.5 mg/dL   GFR calc non Af Amer 85 (*) >90 mL/min   GFR calc Af Amer >90  >90 mL/min  CBC      Result Value Range   WBC 7.6  4.0 - 10.5 K/uL   RBC 3.44 (*) 3.87 - 5.11 MIL/uL   Hemoglobin 11.4 (*) 12.0 - 15.0 g/dL   HCT 09.6 (*) 04.5 - 40.9 %   MCV 97.1  78.0 - 100.0 fL   MCH 33.1  26.0 - 34.0 pg   MCHC 34.1  30.0 - 36.0 g/dL   RDW 81.1  91.4 - 78.2 %   Platelets 220  150 - 400 K/uL  CBC      Result Value Range   WBC 9.5  4.0 - 10.5 K/uL   RBC 2.73 (*) 3.87 - 5.11 MIL/uL   Hemoglobin 9.2 (*) 12.0 - 15.0 g/dL   HCT 95.6 (*) 21.3 - 08.6 %   MCV 97.1  78.0 - 100.0 fL   MCH 33.7  26.0 - 34.0 pg   MCHC 34.7  30.0 - 36.0 g/dL   RDW 57.8  46.9 - 62.9 %   Platelets 185  150 - 400 K/uL  BASIC METABOLIC PANEL      Result Value Range   Sodium 134 (*) 135 - 145 mEq/L   Potassium 4.2  3.5 - 5.1 mEq/L   Chloride 102  96 - 112 mEq/L   CO2 25  19 - 32 mEq/L   Glucose, Bld 139 (*) 70 - 99 mg/dL   BUN 8  6 - 23 mg/dL   Creatinine, Ser 5.28  0.50 - 1.10 mg/dL   Calcium 8.5  8.4 - 41.3 mg/dL   GFR calc non Af Amer 88 (*) >90 mL/min   GFR calc Af Amer >90  >90 mL/min  CBC      Result Value Range   WBC 8.7  4.0 - 10.5 K/uL   RBC 2.59 (*) 3.87 - 5.11 MIL/uL   Hemoglobin 8.6 (*) 12.0 - 15.0 g/dL   HCT 24.4 (*) 01.0 - 27.2 %   MCV 98.8  78.0 - 100.0 fL   MCH 33.2  26.0 - 34.0 pg   MCHC 33.6  30.0 - 36.0 g/dL   RDW 53.6  64.4 - 03.4 %   Platelets 163  150 - 400 K/uL  PREPARE RBC (CROSSMATCH)      Result Value Range   Order Confirmation ORDER PROCESSED BY BLOOD BANK    TYPE AND SCREEN      Result Value Range   ABO/RH(D) A POS     Antibody Screen NEG     Sample  Expiration 06/15/2013     Unit Number [id number]     Blood Component Type RED CELLS,LR     Unit division 00     Status of Unit ALLOCATED     Transfusion Status OK TO TRANSFUSE     Crossmatch Result Compatible     Unit Number [id number]     Blood Component Type RED CELLS,LR     Unit division 00     Status of Unit ALLOCATED     Transfusion Status OK TO TRANSFUSE     Crossmatch Result Compatible  Time coordinating discharge: 35 minutes  Signed: Pearla Dubonnet, MD 06/15/2013, 8:25 AM

## 2013-06-15 NOTE — Progress Notes (Signed)
Physical Therapy Treatment Patient Details Name: Kristin Solomon MRN: [id number] DOB: 04/19/1930 Today's Date: 06/15/2013 Time: 7829-5621 PT Time Calculation (min): 23 min  PT Assessment / Plan / Recommendation  History of Present Illness Pt is an 79 y/o female admitted s/p fall, and is now s/p left hip hemiarthroplasty.    PT Comments   Pt very pleasant & willing to participate in therapy.  States she feels better today; pain better controlled.  Cont with current POC.    Follow Up Recommendations  SNF     Does the patient have the potential to tolerate intense rehabilitation     Barriers to Discharge        Equipment Recommendations  None recommended by PT    Recommendations for Other Services    Frequency 7X/week   Progress towards PT Goals Progress towards PT goals: Progressing toward goals  Plan Current plan remains appropriate    Precautions / Restrictions Precautions Precautions: Fall;Posterior Hip Precaution Comments: Reviewed 3/3 hip precautions. Restrictions LLE Weight Bearing: Weight bearing as tolerated       Mobility  Bed Mobility Bed Mobility: Supine to Sit;Sitting - Scoot to Edge of Bed Supine to Sit: 4: Min guard;With rails Sitting - Scoot to Edge of Bed: 5: Supervision Details for Bed Mobility Assistance: cues for technique.  Incr time but no physical (A) needed.   Transfers Transfers: Sit to Stand;Stand to Sit Sit to Stand: 4: Min guard;With upper extremity assist;From bed Stand to Sit: 4: Min guard;With upper extremity assist;With armrests;To chair/3-in-1 Details for Transfer Assistance: cues to reinforce safest hand placement Ambulation/Gait Ambulation/Gait Assistance: 4: Min guard Ambulation Distance (Feet): 80 Feet Assistive device: Rolling walker Ambulation/Gait Assistance Details: cues for sequencing, & safe RW advancement.  Encouragement to relax UE's.   Gait Pattern: Step-through pattern;Decreased stride length Gait velocity:  Decreased Stairs: No Wheelchair Mobility Wheelchair Mobility: No    Exercises Total Joint Exercises Ankle Circles/Pumps: AROM;Both;15 reps Heel Slides: AAROM;Strengthening;Left;15 reps Hip ABduction/ADduction: AAROM;Strengthening;Left;15 reps Straight Leg Raises: AAROM;Strengthening;Left;15 reps Long Arc Quad: AROM;Strengthening;Left;15 reps     PT Goals (current goals can now be found in the care plan section) Acute Rehab PT Goals PT Goal Formulation: With patient Time For Goal Achievement: 06/21/13 Potential to Achieve Goals: Good  Visit Information  Last PT Received On: 06/15/13 Assistance Needed: +1 History of Present Illness: Pt is an 79 y/o female admitted s/p fall, and is now s/p left hip hemiarthroplasty.     Subjective Data      Cognition  Cognition Arousal/Alertness: Awake/alert Behavior During Therapy: WFL for tasks assessed/performed Overall Cognitive Status: Within Functional Limits for tasks assessed    Balance     End of Session PT - End of Session Activity Tolerance: Patient tolerated treatment well Patient left: in chair;with call bell/phone within reach Nurse Communication: Mobility status   GP     Lara Mulch 06/15/2013, 2:35 PM  Verdell Face, PTA [phone] 06/15/2013

## 2013-06-15 NOTE — Progress Notes (Signed)
Patient ID: Kristin Solomon, female   DOB: 1930-04-14, 79 y.o.   MRN: [id number]     Subjective:  Patient reports pain as mild to moderate.  Hip doing better but her left shoulder continues to hurt  Objective:   VITALS:   Filed Vitals:   06/14/13 2105 06/15/13 0000 06/15/13 0400 06/15/13 0614  BP: 130/51   149/53  Pulse: 99   97  Temp: 99.4 F (37.4 C)   98.4 F (36.9 C)  TempSrc: Oral   Oral  Resp: 18 18 18 18   Height:      Weight:      SpO2: 97% 97% 97% 98%    ABD soft Sensation intact distally Dorsiflexion/Plantar flexion intact Incision: dressing C/D/I and no drainage Left upper ext pain ROM 0-90  Tender to palpation left shoulder  Known rotator cuff arthroapathy   Lab Results  Component Value Date   WBC 8.7 06/15/2013   HGB 8.6* 06/15/2013   HCT 25.6* 06/15/2013   MCV 98.8 06/15/2013   PLT 163 06/15/2013     Assessment/Plan: 2 Days Post-Op   Principal Problem:   Fracture of femoral neck, left Active Problems:   Hyperlipidemia   Hypertension   Lumbar radiculopathy, chronic   Leukocytosis, unspecified   S/P Nissen fundoplication (without gastrostomy tube) procedure   Lactose intolerance   PVCs (premature ventricular contractions)   Advance diet Up with therapy Continue plan per medicine WBAT left lower and upper ext. Dry dressing PRN left lower ext Acute blood loss anemia, superimposed upon chronic iron deficiency anemia, observed.  Kristin Solomon 06/15/2013, 7:21 AM   Kristin Lucy, MD Cell [phone]

## 2013-06-16 ENCOUNTER — Encounter (HOSPITAL_COMMUNITY): Payer: Self-pay | Admitting: Orthopedic Surgery

## 2013-06-16 DIAGNOSIS — I10 Essential (primary) hypertension: Secondary | ICD-10-CM | POA: Diagnosis not present

## 2013-06-16 DIAGNOSIS — M25559 Pain in unspecified hip: Secondary | ICD-10-CM | POA: Diagnosis not present

## 2013-06-16 DIAGNOSIS — Z09 Encounter for follow-up examination after completed treatment for conditions other than malignant neoplasm: Secondary | ICD-10-CM | POA: Diagnosis not present

## 2013-06-16 DIAGNOSIS — F341 Dysthymic disorder: Secondary | ICD-10-CM | POA: Diagnosis not present

## 2013-06-16 DIAGNOSIS — M6281 Muscle weakness (generalized): Secondary | ICD-10-CM | POA: Diagnosis not present

## 2013-06-16 DIAGNOSIS — Z471 Aftercare following joint replacement surgery: Secondary | ICD-10-CM | POA: Diagnosis not present

## 2013-06-16 DIAGNOSIS — S72009D Fracture of unspecified part of neck of unspecified femur, subsequent encounter for closed fracture with routine healing: Secondary | ICD-10-CM | POA: Diagnosis not present

## 2013-06-16 DIAGNOSIS — R269 Unspecified abnormalities of gait and mobility: Secondary | ICD-10-CM | POA: Diagnosis not present

## 2013-06-16 DIAGNOSIS — R262 Difficulty in walking, not elsewhere classified: Secondary | ICD-10-CM | POA: Diagnosis not present

## 2013-06-16 DIAGNOSIS — S72009A Fracture of unspecified part of neck of unspecified femur, initial encounter for closed fracture: Secondary | ICD-10-CM | POA: Diagnosis not present

## 2013-06-16 DIAGNOSIS — B0230 Zoster ocular disease, unspecified: Secondary | ICD-10-CM | POA: Diagnosis not present

## 2013-06-16 DIAGNOSIS — Z9181 History of falling: Secondary | ICD-10-CM | POA: Diagnosis not present

## 2013-06-16 DIAGNOSIS — E782 Mixed hyperlipidemia: Secondary | ICD-10-CM | POA: Diagnosis not present

## 2013-06-16 DIAGNOSIS — K5900 Constipation, unspecified: Secondary | ICD-10-CM | POA: Diagnosis not present

## 2013-06-16 DIAGNOSIS — D509 Iron deficiency anemia, unspecified: Secondary | ICD-10-CM | POA: Diagnosis not present

## 2013-06-16 DIAGNOSIS — D649 Anemia, unspecified: Secondary | ICD-10-CM | POA: Diagnosis not present

## 2013-06-16 LAB — CBC
HCT: 23.3 % — ABNORMAL LOW (ref 36.0–46.0)
Hemoglobin: 8 g/dL — ABNORMAL LOW (ref 12.0–15.0)
MCH: 33.8 pg (ref 26.0–34.0)
MCHC: 34.3 g/dL (ref 30.0–36.0)
MCV: 98.3 fL (ref 78.0–100.0)
RDW: 12.6 % (ref 11.5–15.5)

## 2013-06-16 LAB — TYPE AND SCREEN
Antibody Screen: NEGATIVE
Unit division: 0
Unit division: 0

## 2013-06-16 MED ORDER — ENOXAPARIN SODIUM 40 MG/0.4ML ~~LOC~~ SOLN: 40.0000 mg | SUBCUTANEOUS | Status: AC

## 2013-06-16 NOTE — Progress Notes (Signed)
Called and gave report to Tiffany at [facility] at this time.

## 2013-06-16 NOTE — Progress Notes (Signed)
CSW (Clinical Child psychotherapist) prepared pt dc packet and placed with shadow chart. CSW arranged non-emergent ambulance transport for 11am. Pt,pt nurse, and facility informed. CSW signing off.  Eriel Doyon, LCSWA [phone]

## 2013-06-16 NOTE — Progress Notes (Signed)
Physical Therapy Treatment Patient Details Name: Kristin Solomon MRN: [id number] DOB: June 21, 1930 Today's Date: 06/16/2013 Time: 4098-1191 PT Time Calculation (min): 35 min  PT Assessment / Plan / Recommendation  History of Present Illness Pt is an 79 y/o female admitted s/p fall, and is now s/p left hip hemiarthroplasty.    PT Comments   Pt moving well & cont's to progress with mobility.    Follow Up Recommendations  SNF     Does the patient have the potential to tolerate intense rehabilitation     Barriers to Discharge        Equipment Recommendations  None recommended by PT    Recommendations for Other Services    Frequency 7X/week   Progress towards PT Goals Progress towards PT goals: Progressing toward goals  Plan Current plan remains appropriate    Precautions / Restrictions Precautions Precautions: Fall;Posterior Hip Precaution Booklet Issued: No Precaution Comments: Reviewed 3/3 hip precautions. Restrictions Weight Bearing Restrictions: Yes LLE Weight Bearing: Weight bearing as tolerated       Mobility  Bed Mobility Bed Mobility: Not assessed Transfers Transfers: Sit to Stand;Stand to Sit Sit to Stand: 5: Supervision;With upper extremity assist;With armrests;From chair/3-in-1 Stand to Sit: 5: Supervision;With upper extremity assist;With armrests;To chair/3-in-1 Details for Transfer Assistance: Pt demonstrated safe hand placement & technique.   Ambulation/Gait Ambulation/Gait Assistance: 5: Supervision Ambulation Distance (Feet): 150 Feet Assistive device: Rolling walker Ambulation/Gait Assistance Details: Cues to stay closer to RW, relax UE's Gait Pattern: Step-through pattern;Decreased stride length;Decreased stance time - right Gait velocity: Decreased Stairs: No    Exercises Total Joint Exercises Ankle Circles/Pumps: AROM;Both;15 reps Quad Sets: AROM;Both;15 reps Heel Slides: AROM;Strengthening;Left;15 reps Hip ABduction/ADduction:  AROM;Strengthening;Left;15 reps Straight Leg Raises: Strengthening;Left;15 reps;AROM;AAROM Long Arc Quad: AROM;Strengthening;Left;15 reps     PT Goals (current goals can now be found in the care plan section) Acute Rehab PT Goals PT Goal Formulation: With patient Time For Goal Achievement: 06/21/13 Potential to Achieve Goals: Good  Visit Information  Last PT Received On: 06/16/13 Assistance Needed: +1 History of Present Illness: Pt is an 79 y/o female admitted s/p fall, and is now s/p left hip hemiarthroplasty.     Subjective Data      Cognition  Cognition Arousal/Alertness: Awake/alert Behavior During Therapy: WFL for tasks assessed/performed Overall Cognitive Status: Within Functional Limits for tasks assessed    Balance     End of Session PT - End of Session Activity Tolerance: Patient tolerated treatment well Patient left: in chair;with call bell/phone within reach;with family/visitor present Nurse Communication: Mobility status   GP     Lara Mulch 06/16/2013, 11:30 AM   Verdell Face, PTA [phone] 06/16/2013

## 2013-06-16 NOTE — Progress Notes (Signed)
     Subjective:  Patient reports pain as mild.  Denies lightheadedness or dizziness from a seated subcutaneous fat.  Objective:   VITALS:   Filed Vitals:   06/15/13 1554 06/15/13 2121 06/16/13 0603 06/16/13 0849  BP: 101/44 128/45 112/47   Pulse: 87 95 85   Temp: 98.4 F (36.9 C) 98.4 F (36.9 C) 97.5 F (36.4 C)   TempSrc:  Oral Oral   Resp: 18 18 18 16   Height:      Weight:      SpO2: 96% 94% 95% 98%    Neurologically intact Incision: dressing C/D/I   Lab Results  Component Value Date   WBC 6.5 06/16/2013   HGB 8.0* 06/16/2013   HCT 23.3* 06/16/2013   MCV 98.3 06/16/2013   PLT 201 06/16/2013     Assessment/Plan: 3 Days Post-Op   Principal Problem:   Fracture of femoral neck, left Active Problems:   Hyperlipidemia   Hypertension   Lumbar radiculopathy, chronic   Leukocytosis, unspecified   S/P Nissen fundoplication (without gastrostomy tube) procedure   Lactose intolerance   PVCs (premature ventricular contractions)   Advance diet Up with therapy Discharge to SNF Acute on chronic anemia, iron deficient with a component of blood loss anemia. She seems to be minimally symptomatic, and would not transfuse currently. Dr. Kevan Ny as indicated planning on rechecking her hemoglobin in 3 days. Okay for skilled nursing for my standpoint.   Kong Packett P 06/16/2013, 9:04 AM   Teryl Lucy, MD Cell [phone]

## 2013-06-16 NOTE — Progress Notes (Signed)
Subjective: Continues to feel better. Plans are for D/C today. Will D/C ASA until Lovenox is D/C'd which is planned for 07/08/2013.  Would resume ASA 81 mg 07/09/2013. No other tx changes from D/C note yesterday  Objective: Weight change:   Intake/Output Summary (Last 24 hours) at 06/16/13 0709 Last data filed at 06/16/13 0604  Gross per 24 hour  Intake    840 ml  Output      0 ml  Net    840 ml   Filed Vitals:   06/15/13 0614 06/15/13 1554 06/15/13 2121 06/16/13 0603  BP: 149/53 101/44 128/45 112/47  Pulse: 97 87 95 85  Temp: 98.4 F (36.9 C) 98.4 F (36.9 C) 98.4 F (36.9 C) 97.5 F (36.4 C)  TempSrc: Oral  Oral Oral  Resp: 18 18 18 18   Height:      Weight:      SpO2: 98% 96% 94% 95%    General appearance: alert and cooperative  Resp: clear to auscultation bilaterally  Cardio: regular rate and rhythm, S1, S2 normal, no murmur, click, rub or gallop  Abdomen: Soft nontender bowel sounds normal  Extremities: Left hip wound dry with minimal bruising. It is sore when she raises her left arm above the horizontal in the left shoulder area.  Neurological: Oriented x3 nonfocal    Lab Results: Results for orders placed during the hospital encounter of 06/12/13 (from the past 48 hour(s))  CBC     Status: Abnormal   Collection Time    06/15/13  6:29 AM      Result Value Range   WBC 8.7  4.0 - 10.5 K/uL   RBC 2.59 (*) 3.87 - 5.11 MIL/uL   Hemoglobin 8.6 (*) 12.0 - 15.0 g/dL   HCT 16.1 (*) 09.6 - 04.5 %   MCV 98.8  78.0 - 100.0 fL   MCH 33.2  26.0 - 34.0 pg   MCHC 33.6  30.0 - 36.0 g/dL   RDW 40.9  81.1 - 91.4 %   Platelets 163  150 - 400 K/uL  BASIC METABOLIC PANEL     Status: Abnormal   Collection Time    06/15/13  6:29 AM      Result Value Range   Sodium 136  135 - 145 mEq/L   Potassium 4.3  3.5 - 5.1 mEq/L   Chloride 102  96 - 112 mEq/L   CO2 25  19 - 32 mEq/L   Glucose, Bld 111 (*) 70 - 99 mg/dL   BUN 10  6 - 23 mg/dL   Creatinine, Ser 7.82  0.50 - 1.10 mg/dL   Calcium 8.2 (*) 8.4 - 10.5 mg/dL   GFR calc non Af Amer 86 (*) >90 mL/min   GFR calc Af Amer >90  >90 mL/min   Comment: (NOTE)     The eGFR has been calculated using the CKD EPI equation.     This calculation has not been validated in all clinical situations.     eGFR's persistently <90 mL/min signify possible Chronic Kidney     Disease.  CBC     Status: Abnormal   Collection Time    06/16/13  4:30 AM      Result Value Range   WBC 6.5  4.0 - 10.5 K/uL   RBC 2.37 (*) 3.87 - 5.11 MIL/uL   Hemoglobin 8.0 (*) 12.0 - 15.0 g/dL   HCT 95.6 (*) 21.3 - 08.6 %   MCV 98.3  78.0 - 100.0 fL  MCH 33.8  26.0 - 34.0 pg   MCHC 34.3  30.0 - 36.0 g/dL   RDW 40.9  81.1 - 91.4 %   Platelets 201  150 - 400 K/uL    Studies/Results: No results found. Medications: Scheduled Meds: . atorvastatin  20 mg Oral q1800  . cholecalciferol  2,000 Units Oral Daily  . docusate sodium  100 mg Oral BID  . enoxaparin (LOVENOX) injection  40 mg Subcutaneous Q24H  . ferrous gluconate  324 mg Oral Q breakfast  . metoprolol  50 mg Oral Daily  . metoprolol tartrate  25 mg Oral QHS  . pantoprazole  80 mg Oral Daily  . senna  1 tablet Oral BID  . valACYclovir  1,000 mg Oral QHS  . venlafaxine XR  75 mg Oral QHS   Continuous Infusions: . 0.45 % NaCl with KCl 20 mEq / L 75 mL/hr at 06/13/13 1339   PRN Meds:.acetaminophen, acetaminophen, ALPRAZolam, alum & mag hydroxide-simeth, bisacodyl, HYDROcodone-acetaminophen, HYDROmorphone (DILAUDID) injection, menthol-cetylpyridinium, metoCLOPramide (REGLAN) injection, metoCLOPramide, ondansetron (ZOFRAN) IV, ondansetron, phenol, polyethylene glycol, senna, zolpidem  Assessment/Plan:  Principal Problem:  Fracture of femoral neck, left - continue PT OT and skilled nursing facility  Active Problems:  Hyperlipidemia  Hypertension  Lumbar radiculopathy, chronic  Leukocytosis, unspecified - resolved - likely secondary to stress of her fracture  S/P Nissen fundoplication  (without gastrostomy tube) procedure  Lactose intolerance  PVCs (premature ventricular contractions)  Anemia - history of iron deficiency and also likely secondary to fracture and loss secondary to surgery - hold ASA while on Lovenox and re-chk Hgb in 2-3 days. Continue iron therapy     LOS: 4 days   Pearla Dubonnet, MD 06/16/2013, 7:09 AM

## 2013-06-21 ENCOUNTER — Non-Acute Institutional Stay (SKILLED_NURSING_FACILITY): Payer: Medicare Other | Admitting: Nurse Practitioner

## 2013-06-21 ENCOUNTER — Encounter: Payer: Self-pay | Admitting: Nurse Practitioner

## 2013-06-21 DIAGNOSIS — IMO0002 Reserved for concepts with insufficient information to code with codable children: Secondary | ICD-10-CM

## 2013-06-21 DIAGNOSIS — D509 Iron deficiency anemia, unspecified: Secondary | ICD-10-CM

## 2013-06-21 DIAGNOSIS — Z9889 Other specified postprocedural states: Secondary | ICD-10-CM

## 2013-06-21 DIAGNOSIS — B0230 Zoster ocular disease, unspecified: Secondary | ICD-10-CM | POA: Diagnosis not present

## 2013-06-21 DIAGNOSIS — S72002D Fracture of unspecified part of neck of left femur, subsequent encounter for closed fracture with routine healing: Secondary | ICD-10-CM

## 2013-06-21 DIAGNOSIS — Z09 Encounter for follow-up examination after completed treatment for conditions other than malignant neoplasm: Secondary | ICD-10-CM

## 2013-06-21 DIAGNOSIS — F418 Other specified anxiety disorders: Secondary | ICD-10-CM | POA: Insufficient documentation

## 2013-06-21 DIAGNOSIS — K5900 Constipation, unspecified: Secondary | ICD-10-CM

## 2013-06-21 DIAGNOSIS — K5901 Slow transit constipation: Secondary | ICD-10-CM | POA: Insufficient documentation

## 2013-06-21 DIAGNOSIS — R000 Tachycardia, unspecified: Secondary | ICD-10-CM

## 2013-06-21 DIAGNOSIS — M5416 Radiculopathy, lumbar region: Secondary | ICD-10-CM

## 2013-06-21 DIAGNOSIS — S72009D Fracture of unspecified part of neck of unspecified femur, subsequent encounter for closed fracture with routine healing: Secondary | ICD-10-CM

## 2013-06-21 DIAGNOSIS — I10 Essential (primary) hypertension: Secondary | ICD-10-CM

## 2013-06-21 DIAGNOSIS — F341 Dysthymic disorder: Secondary | ICD-10-CM

## 2013-06-21 NOTE — Assessment & Plan Note (Signed)
Controlled.  

## 2013-06-21 NOTE — Assessment & Plan Note (Addendum)
S/p total left hip arthroplasty-healing nicely. Pain is controlled with Hydrocodone-the patient complains of night mares associated with Oxycodone. Desires to try Dilaudid since it was well tolerated when she had her right knee arthroplasty surgery. Will have Dilaudid 2mg  q4hr prn available to her for pain control.

## 2013-06-21 NOTE — Assessment & Plan Note (Signed)
chronic

## 2013-06-21 NOTE — Assessment & Plan Note (Signed)
The left hip arthroplasty surgery has aggravated her anemia, continue Fe supplement, stat CBC. The patient stated she had blood transfusions with her left shoulder surgery and right knee surgery in the past. No blood transfusion with the left hip arthroplasty.

## 2013-06-21 NOTE — Progress Notes (Signed)
Patient ID: Kristin Solomon, female   DOB: [birth year], 79 y.o.   MRN: [id number]   Code Status: DNR  Allergies  Allergen Reactions  . Morphine And Related Other (See Comments)    AGITATION, STRANGE DREAMS  . Scopace [Scopolamine] Other (See Comments)    MENTAL CHANGES    Chief Complaint  Patient presents with  . Medical Managment of Chronic Issues    s/p the left hip arthroplasty.   . Acute Visit  . Hospitalization Follow-up    HPI: Patient is a 79 y.o. female seen in the SNF at [facility] today for evaluation of  Left hip pain and other chronic medical conditions. Hospitalized 06/12/2013 -06/15/2013 due to Fracturde of femoral neck, left - resulted from a fall on to her left sided in a retail store when she slip and tripped. The left hip total arthroplasty by Dr. Teryl Lucy and she also had an injection to her left shoulder with steroid secondary to soft tissue injury(Hx of left shoulder surgery for rotator cuff)  Problem List Items Addressed This Visit   S/P Nissen fundoplication (without gastrostomy tube) procedure (Chronic)     Managed with Omeprazole 20mg  bid.     Hypertension     Controlled.     Tachycardia     HR 90s at rest, takes Metoprolol 50mg  in adm and 25mg  in pm.     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 The patient stated she had blood transfusions with her left shoulder surgery and right knee surgery in the past. No blood transfusion with the left hip arthroplasty.     Lumbar radiculopathy, chronic     chronic    Closed left hip fracture     S/p total left hip arthroplasty-healing nicely. Pain is controlled with Hydrocodone-the patient complains of night mares associated with Oxycodone. Desires to try Dilaudid since it was well tolerated when she had her right knee arthroplasty  surgery. Will have Dilaudid 2mg  q4hr prn available to her for pain control.     Unspecified constipation     Colace 100mg  bid, Senna II nightly and I hs prn, Bisacodyl 10mg  PR daily prn-not adequate, will add MiraLax 17gm with 8 Oz water daily.     Depression with anxiety     Managed with Venlafaxine 75mg  qhs and prn Alprazolam 0.25mg  qhs.       Review of Systems:  Review of Systems  Constitutional: Positive for malaise/fatigue. Negative for fever, chills, weight loss and diaphoresis.       Generalized  HENT: Negative for congestion, ear discharge, ear pain, hearing loss, nosebleeds, sore throat and tinnitus.   Eyes: Negative for blurred vision, double vision, photophobia, pain, discharge and redness.  Respiratory: Negative for cough, hemoptysis, sputum production, shortness of breath, wheezing and stridor.   Cardiovascular: Positive for leg swelling. Negative for chest pain, palpitations, orthopnea, claudication and PND.       Trace in her right leg.   Gastrointestinal: Positive for heartburn and constipation. Negative for nausea, vomiting, abdominal pain, diarrhea, blood in stool and melena.  Genitourinary: Negative for dysuria, urgency, frequency, hematuria and flank pain.  Musculoskeletal: Positive for back pain, falls and joint pain. Negative for myalgias and neck pain.  Skin: Negative for itching and rash.       Left hip  surgical incision intact.   Neurological: Positive for weakness. Negative for dizziness, tingling, tremors, sensory change, speech change, focal weakness, seizures, loss of consciousness and headaches.  Endo/Heme/Allergies: Negative for environmental allergies and polydipsia. Does not bruise/bleed easily.  Psychiatric/Behavioral: Positive for depression. Negative for suicidal ideas, hallucinations, memory loss and substance abuse. The patient is nervous/anxious and has insomnia.      Past Medical History  Diagnosis Date  . Herpes ocular     history of  . GERD  (gastroesophageal reflux disease)   . Depression   . High cholesterol   . Hypertension     under control; has been on med. x 10 yr.  . History of hiatal hernia   . Osteoarthritis of knee     bilateral  . Anemia, iron deficiency   . Dental crowns present   . Fracture of femoral neck, left 06/12/2013  . Stenosing tenosynovitis of finger 08/2012    right index finger  . Sciatic pain    Past Surgical History  Procedure Laterality Date  . Lumbar laminectomy/decompression microdiscectomy  08/29/2010    L2-S1  . Shoulder arthroscopy with rotator cuff repair and subacromial decompression Right 01/01/2000  . Total knee arthroplasty Left 05/14/2004  . Abdominal hysterectomy  1995    partial  . Laparoscopic nissen fundoplication  07/03/2001  . Descemets stripping automated endothelial keratoplasty Left 03/14/2011  . Tonsillectomy      as child  . Colonoscopy    . Total knee arthroplasty  12/23/2011    Procedure: TOTAL KNEE ARTHROPLASTY;  Surgeon: Nilda Simmer, MD;  Location: [facility] OR;  Service: Orthopedics;  Laterality: Right;  DR Thurston Hole WANTS 90 MINUTES FOR THIS CASE  . Knee arthroscopy Right 05/11/2001  . Trigger finger release Right 07/21/2007    thumb  . Carpal tunnel release Right 07/21/2007  . Trigger finger release Left 09/24/2007    middle finger  . Carpal tunnel release Left 09/24/2007  . Trigger finger release Right 03/10/2008    ring and little fingers  . Descemets stripping automated endothelial keratoplasty Right 08/20/2012  . Trigger finger release Right 09/02/2012    Procedure: RELEASE TRIGGER FINGER/A-1 PULLEY RIGHT INDEX FINGER;  Surgeon: Nicki Reaper, MD;  Location: [facility];  Service: Orthopedics;  Laterality: Right;  . Trigger finger release Left 12/22/2012    Procedure: RELEASE TRIGGER FINGER/A-1 PULLEY LEFT RING FINGER;  Surgeon: Nicki Reaper, MD;  Location: [facility];  Service: Orthopedics;  Laterality: Left;  Left   . Joint replacement       bilateral knees, left knee  . Hip arthroplasty Left 06/13/2013    Procedure: ARTHROPLASTY BIPOLAR HIP; Injection left shoulder;  Surgeon: Eulas Post, MD;  Location: [facility] OR;  Service: Orthopedics;  Laterality: Left;   Social History:   reports that she has quit smoking. She has never used smokeless tobacco. She reports that she does not drink alcohol or use illicit drugs.  Family History  Problem Relation Age of Onset  . Heart attack Mother   . Parkinsonism Father   . Diabetes Brother     Medications: Patient's Medications  New Prescriptions   No medications on file  Previous Medications   ACETAMINOPHEN (TYLENOL) 500 MG TABLET    Take 500 mg by mouth every 6 (six) hours as needed for pain.   ALPRAZOLAM (XANAX) 0.5 MG TABLET    Take 0.25 mg by mouth at bedtime as needed for sleep.   ALUM & MAG HYDROXIDE-SIMETH (MAALOX/MYLANTA)  200-200-20 MG/5ML SUSPENSION    Take 30 mLs by mouth every 4 (four) hours as needed for indigestion.   BISACODYL (DULCOLAX) 10 MG SUPPOSITORY    Place 1 suppository (10 mg total) rectally daily as needed for moderate constipation.   CHOLECALCIFEROL (VITAMIN D) 1000 UNITS TABLET    Take 2,000 Units by mouth daily.   DOCUSATE SODIUM 100 MG CAPS    Take 100 mg by mouth 2 (two) times daily.   ENOXAPARIN (LOVENOX) 40 MG/0.4ML INJECTION    Inject 0.4 mLs (40 mg total) into the skin daily.   ENOXAPARIN (LOVENOX) 40 MG/0.4ML INJECTION    Inject 0.4 mLs (40 mg total) into the skin daily.   FERROUS GLUCONATE (IRON) 240 (27 FE) MG TABS    Take 1 tablet by mouth daily.    HYDROMORPHONE (DILAUDID) 2 MG TABLET    Take 2 mg by mouth every 4 (four) hours as needed for severe pain.   MENTHOL-CETYLPYRIDINIUM (CEPACOL) 3 MG LOZENGE    Take 1 lozenge (3 mg total) by mouth as needed for sore throat (sore throat).   METOPROLOL (LOPRESSOR) 50 MG TABLET    Take 25-50 mg by mouth 2 (two) times daily. Take 50 MG in the morning and take 25 MG at bedtime.   OMEPRAZOLE (PRILOSEC) 40 MG  CAPSULE    Take 40 mg by mouth 2 (two) times daily.    POLYETHYLENE GLYCOL (MIRALAX / GLYCOLAX) PACKET    Take 17 g by mouth daily.   ROSUVASTATIN (CRESTOR) 10 MG TABLET    Take 10 mg by mouth daily.   SENNA (SENOKOT) 8.6 MG TABS    Take 1 tablet by mouth at bedtime as needed for mild constipation.    SENNOSIDES-DOCUSATE SODIUM (SENOKOT-S) 8.6-50 MG TABLET    Take 2 tablets by mouth daily.   VALACYCLOVIR (VALTREX) 500 MG TABLET    Take 1,000 mg by mouth at bedtime.    VENLAFAXINE XR (EFFEXOR-XR) 75 MG 24 HR CAPSULE    Take 75 mg by mouth at bedtime.   Modified Medications   No medications on file  Discontinued Medications   HYDROCODONE-ACETAMINOPHEN (NORCO) 10-325 MG PER TABLET    Take 1-2 tablets by mouth every 6 (six) hours as needed.   HYDROCODONE-ACETAMINOPHEN (NORCO) 10-325 MG PER TABLET    Take 1-2 tablets by mouth every 6 (six) hours as needed for severe pain.     Physical Exam: Physical Exam  Constitutional: She is oriented to person, place, and time. She appears well-developed and well-nourished. No distress.  HENT:  Head: Normocephalic and atraumatic.  Right Ear: External ear normal.  Left Ear: External ear normal.  Nose: Nose normal.  Mouth/Throat: Oropharynx is clear and moist. No oropharyngeal exudate.  Eyes: Conjunctivae and EOM are normal. Pupils are equal, round, and reactive to light. Right eye exhibits no discharge. Left eye exhibits no discharge. No scleral icterus.  Neck: Normal range of motion. Neck supple. No JVD present. No tracheal deviation present. No thyromegaly present.  Cardiovascular: Normal rate, regular rhythm, normal heart sounds and intact distal pulses.   No murmur heard. HR 90s at rest.   Pulmonary/Chest: Effort normal and breath sounds normal. No stridor. No respiratory distress. She has no wheezes. She has no rales. She exhibits no tenderness.  Abdominal: Soft. Bowel sounds are normal. She exhibits no distension. There is no tenderness. There is no  rebound and no guarding.  Musculoskeletal: She exhibits edema and tenderness.  Left hip and left leg. Left shoulder  pain-chronic.   Lymphadenopathy:    She has no cervical adenopathy.  Neurological: She is alert and oriented to person, place, and time. She has normal reflexes. She displays normal reflexes. No cranial nerve deficit. She exhibits normal muscle tone. Coordination normal.  Skin: Skin is warm and dry. No rash noted. She is not diaphoretic. No erythema. There is pallor.  Left hip surgical incision intact and no s/s of cellulitis.   Psychiatric: She has a normal mood and affect. Her behavior is normal. Judgment and thought content normal.    Filed Vitals:   06/21/13 1243  BP: 132/62  Pulse: 78  Temp: 97.1 F (36.2 C)  TempSrc: Tympanic  Resp: 18      Labs reviewed: Basic Metabolic Panel:  Recent Labs  16/10/96 0445 06/14/13 0647 06/15/13 0629  NA 136 134* 136  K 4.5 4.2 4.3  CL 103 102 102  CO2 25 25 25   GLUCOSE 108* 139* 111*  BUN 13 8 10   CREATININE 0.57 0.50 0.54  CALCIUM 8.2* 8.5 8.2*   Liver Function Tests:  Recent Labs  06/12/13 1844  AST 25  ALT 20  ALKPHOS 73  BILITOT 0.3  PROT 7.1  ALBUMIN 4.0   CBC:  Recent Labs  06/12/13 1844  06/14/13 0647 06/15/13 0629 06/16/13 0430  WBC 12.0*  < > 9.5 8.7 6.5  NEUTROABS 8.6*  --   --   --   --   HGB 13.0  < > 9.2* 8.6* 8.0*  HCT 37.6  < > 26.5* 25.6* 23.3*  MCV 96.7  < > 97.1 98.8 98.3  PLT 245  < > 185 163 201  < > = values in this interval not displayed.  Past Procedures:  Significant Diagnostic Studies:   06/12/2013 CLINICAL DATA: History of fall complaining of left sided pain. EXAM: CHEST - 1 VIEW IMPRESSION: 1. Probable subsegmental atelectasis in the left lower lobe. In the setting of recent trauma with chest pain, this may be related to splinting.   06/12/2013 CLINICAL DATA: History of fall complaining of pelvic pain, particularly in the left hip. EXAM: BILATERAL HIP WITH PELVIS  IMPRESSION: 1. Acute mildly displaced transcervical femoral neck fracture of the left hip.   06/12/2013 CLINICAL DATA: History of trauma from a fall. EXAM: CT HEAD WITHOUT CONTRAST CT CERVICAL SPINE WITHOUT CONTRAST IMPRESSION: 1. No evidence of significant acute traumatic injury to the head, brain or cervical spine. 2. Mild cerebral atrophy with chronic microvascular ischemic changes in the cerebral white matter. 3. Multilevel degenerative disc disease and cervical spondylosis redemonstrated, as above.   06/12/2013 CLINICAL DATA: History of trauma from a fall. EXAM: CT HEAD WITHOUT CONTRAST CT CERVICAL SPINE WITHOUT CONTRAST IMPRESSION: 1. No evidence of significant acute traumatic injury to the head, brain or cervical spine. 2. Mild cerebral atrophy with chronic microvascular ischemic changes in the cerebral white matter. 3. Multilevel degenerative disc disease and cervical spondylosis redemonstrated, as above.   06/13/2013 CLINICAL DATA: Left hip replacement. EXAM: PORTABLE PELVIS 1-2 VIEWS IMPRESSION: Left hip replacement with good anatomic alignment on AP view.   06/12/2013 CLINICAL DATA: History of fall complaining of left shoulder pain. EXAM: LEFT SHOULDER - 2+ VIEW IMPRESSION: 1. No acute radiographic abnormality of the left shoulder. 2. Findings compatible with rotator cuff disease and osteoarthritis in the glenohumeral and acromioclavicular joints.   06/13/2013 CLINICAL DATA: Hip replacement. EXAM: PORTABLE LEFT HIP - 1 VIEW COMPARISON: AP pelvis 06/13/2013 IMPRESSION: Left hip replacement. Good anatomic alignment.   Assessment/Plan  Anemia, iron deficiency The left hip arthroplasty surgery has aggravated her anemia, continue Fe supplement, stat CBC. The patient stated she had blood transfusions with her left shoulder surgery and right knee surgery in the past. No blood transfusion with the left hip arthroplasty.   Closed left hip fracture S/p total left hip arthroplasty-healing nicely.  Pain is controlled with Hydrocodone-the patient complains of night mares associated with Oxycodone. Desires to try Dilaudid since it was well tolerated when she had her right knee arthroplasty surgery. Will have Dilaudid 2mg  q4hr prn available to her for pain control.   Unspecified constipation Colace 100mg  bid, Senna II nightly and I hs prn, Bisacodyl 10mg  PR daily prn-not adequate, will add MiraLax 17gm with 8 Oz water daily.   Herpes ocular No flare ups, takes Valacyclovir 1000mg  daily  Lumbar radiculopathy, chronic chronic  Tachycardia HR 90s at rest, takes Metoprolol 50mg  in adm and 25mg  in pm.   Hypertension Controlled.   S/P Nissen fundoplication (without gastrostomy tube) procedure Managed with Omeprazole 20mg  bid.   Depression with anxiety Managed with Venlafaxine 75mg  qhs and prn Alprazolam 0.25mg  qhs.    Family/ Staff Communication: observe the patient  Goals of Care: [place]-here for Rehab.   Labs/tests ordered: CBC and BMP stat.

## 2013-06-21 NOTE — Assessment & Plan Note (Signed)
Managed with Venlafaxine 75mg qhs and prn Alprazolam 0.25mg qhs.   

## 2013-06-21 NOTE — Assessment & Plan Note (Signed)
No flare ups, takes Valacyclovir 1000mg daily   

## 2013-06-21 NOTE — Assessment & Plan Note (Signed)
HR 90s at rest, takes Metoprolol 50mg in adm and 25mg in pm.    

## 2013-06-21 NOTE — Assessment & Plan Note (Signed)
Managed with Omeprazole 20mg bid.    

## 2013-06-21 NOTE — Assessment & Plan Note (Addendum)
Colace 100mg  bid, Senna II nightly and I hs prn, Bisacodyl 10mg  PR daily prn-not adequate, will add MiraLax 17gm with 8 Oz water daily.

## 2013-06-29 LAB — HEMOGLOBIN A1C: Hgb A1c MFr Bld: 5.8 % (ref 4.0–6.0)

## 2013-06-29 LAB — CBC AND DIFFERENTIAL
HEMATOCRIT: 27 % — AB (ref 36–46)
Hemoglobin: 9.3 g/dL — AB (ref 12.0–16.0)
PLATELETS: 573 10*3/uL — AB (ref 150–399)
WBC: 4.4 10*3/mL

## 2013-06-30 DIAGNOSIS — S7290XD Unspecified fracture of unspecified femur, subsequent encounter for closed fracture with routine healing: Secondary | ICD-10-CM | POA: Diagnosis not present

## 2013-07-19 ENCOUNTER — Non-Acute Institutional Stay (SKILLED_NURSING_FACILITY): Payer: Medicare Other | Admitting: Nurse Practitioner

## 2013-07-19 ENCOUNTER — Encounter: Payer: Self-pay | Admitting: Nurse Practitioner

## 2013-07-19 DIAGNOSIS — I10 Essential (primary) hypertension: Secondary | ICD-10-CM

## 2013-07-19 DIAGNOSIS — F341 Dysthymic disorder: Secondary | ICD-10-CM

## 2013-07-19 DIAGNOSIS — K5900 Constipation, unspecified: Secondary | ICD-10-CM

## 2013-07-19 DIAGNOSIS — B0230 Zoster ocular disease, unspecified: Secondary | ICD-10-CM | POA: Diagnosis not present

## 2013-07-19 DIAGNOSIS — R000 Tachycardia, unspecified: Secondary | ICD-10-CM

## 2013-07-19 DIAGNOSIS — Z09 Encounter for follow-up examination after completed treatment for conditions other than malignant neoplasm: Secondary | ICD-10-CM

## 2013-07-19 DIAGNOSIS — F418 Other specified anxiety disorders: Secondary | ICD-10-CM

## 2013-07-19 DIAGNOSIS — D509 Iron deficiency anemia, unspecified: Secondary | ICD-10-CM | POA: Diagnosis not present

## 2013-07-19 DIAGNOSIS — Z9889 Other specified postprocedural states: Secondary | ICD-10-CM

## 2013-07-19 NOTE — Assessment & Plan Note (Signed)
No flare ups, takes Valacyclovir 1000mg  daily

## 2013-07-19 NOTE — Assessment & Plan Note (Signed)
Managed with Venlafaxine 75mg  qhs and prn Alprazolam 0.25mg  qhs.

## 2013-07-19 NOTE — Assessment & Plan Note (Signed)
HR 90s at rest, takes Metoprolol 50mg  in adm and 25mg  in pm.

## 2013-07-19 NOTE — Assessment & Plan Note (Signed)
Controlled.  

## 2013-07-19 NOTE — Assessment & Plan Note (Signed)
The left hip arthroplasty surgery has aggravated her anemia. The patient stated she had blood transfusions with her left shoulder surgery and right knee surgery in the past. No blood transfusion with the left hip arthroplasty. Hgb improved from 8.7 06/21/13 to 9.3 06/29/13, continue Fe supplement, update CBC

## 2013-07-19 NOTE — Assessment & Plan Note (Signed)
Managed with Omeprazole 20mg  bid.

## 2013-07-19 NOTE — Progress Notes (Signed)
Patient ID: Kristin Solomon, female   DOB: 1930-03-22, 80 y.o.   MRN: [id number]   Code Status: DNR  Allergies  Allergen Reactions  . Morphine And Related Other (See Comments)    AGITATION, STRANGE DREAMS  . Scopace [Scopolamine] Other (See Comments)    MENTAL CHANGES    Chief Complaint  Patient presents with  . Medical Managment of Chronic Issues    HPI: Patient is a 80 y.o. female seen in the SNF at [facility] today for evaluation of  Post op anemia and other chronic medical conditions.   Hospitalized 06/12/2013 -06/15/2013 due to Fracturde of femoral neck, left - resulted from a fall on to her left sided in a retail store when she slip and tripped. The left hip total arthroplasty by Dr. Marchia Bond and she also had an injection to her left shoulder with steroid secondary to soft tissue injury(Hx of left shoulder surgery for rotator cuff)  Problem List Items Addressed This Visit   Anemia, iron deficiency - Primary     The left hip arthroplasty surgery has aggravated her anemia. The patient stated she had blood transfusions with her left shoulder surgery and right knee surgery in the past. No blood transfusion with the left hip arthroplasty. Hgb improved from 8.7 06/21/13 to 9.3 06/29/13, continue Fe supplement, update CBC      Depression with anxiety     Managed with Venlafaxine 75mg  qhs and prn Alprazolam 0.25mg  qhs.      Herpes ocular     No flare ups, takes Valacyclovir 1000mg  daily      Hypertension     Controlled.       Relevant Medications      aspirin EC 81 MG tablet   S/P Nissen fundoplication (without gastrostomy tube) procedure (Chronic)     Managed with Omeprazole 20mg  bid.       Tachycardia     HR 90s at rest, takes Metoprolol 50mg  in adm and 25mg  in pm.       Unspecified constipation     Stable, takes Colace 100mg  bid, Senna II nightly and I hs prn, Bisacodyl 10mg  PR daily prn,  continue MiraLax 17gm with 8 Oz water daily.           Review of Systems:  Review of Systems  Constitutional: Negative for fever, chills, weight loss, malaise/fatigue and diaphoresis.  HENT: Negative for congestion, ear discharge, ear pain, hearing loss, nosebleeds, sore throat and tinnitus.   Eyes: Negative for blurred vision, double vision, photophobia, pain, discharge and redness.  Respiratory: Negative for cough, hemoptysis, sputum production, shortness of breath, wheezing and stridor.   Cardiovascular: Positive for leg swelling. Negative for chest pain, palpitations, orthopnea, claudication and PND.       Trace in her right leg.   Gastrointestinal: Positive for heartburn and constipation. Negative for nausea, vomiting, abdominal pain, diarrhea, blood in stool and melena.  Genitourinary: Negative for dysuria, urgency, frequency, hematuria and flank pain.  Musculoskeletal: Positive for back pain, falls and joint pain. Negative for myalgias and neck pain.  Skin: Negative for itching and rash.       Left hip surgical incision healed.   Neurological: Negative for dizziness, tingling, tremors, sensory change, speech change, focal weakness, seizures, loss of consciousness, weakness and headaches.  Endo/Heme/Allergies: Negative for environmental allergies and polydipsia. Does not bruise/bleed easily.  Psychiatric/Behavioral: Positive for depression. Negative for suicidal ideas, hallucinations, memory loss and substance abuse. The patient is nervous/anxious and has insomnia.  Past Medical History  Diagnosis Date  . Herpes ocular     history of  . GERD (gastroesophageal reflux disease)   . Depression   . High cholesterol   . Hypertension     under control; has been on med. x 10 yr.  . History of hiatal hernia   . Osteoarthritis of knee     bilateral  . Anemia, iron deficiency   . Dental crowns present   . Fracture of femoral neck, left 06/12/2013  . Stenosing tenosynovitis of finger 08/2012    right index finger  . Sciatic pain     Past Surgical History  Procedure Laterality Date  . Lumbar laminectomy/decompression microdiscectomy  08/29/2010    L2-S1  . Shoulder arthroscopy with rotator cuff repair and subacromial decompression Right 01/01/2000  . Total knee arthroplasty Left 05/14/2004  . Abdominal hysterectomy  1995    partial  . Laparoscopic nissen fundoplication  9/93/7169  . Descemets stripping automated endothelial keratoplasty Left 03/14/2011  . Tonsillectomy      as child  . Colonoscopy    . Total knee arthroplasty  12/23/2011    Procedure: TOTAL KNEE ARTHROPLASTY;  Surgeon: Lorn Junes, MD;  Location: [place];  Service: Orthopedics;  Laterality: Right;  DR [place] THIS CASE  . Knee arthroscopy Right 05/11/2001  . Trigger finger release Right 07/21/2007    thumb  . Carpal tunnel release Right 07/21/2007  . Trigger finger release Left 09/24/2007    middle finger  . Carpal tunnel release Left 09/24/2007  . Trigger finger release Right 03/10/2008    ring and little fingers  . Descemets stripping automated endothelial keratoplasty Right 08/20/2012  . Trigger finger release Right 09/02/2012    Procedure: RELEASE TRIGGER FINGER/A-1 PULLEY RIGHT INDEX FINGER;  Surgeon: Wynonia Sours, MD;  Location: [place];  Service: Orthopedics;  Laterality: Right;  . Trigger finger release Left 12/22/2012    Procedure: RELEASE TRIGGER FINGER/A-1 PULLEY LEFT RING FINGER;  Surgeon: Wynonia Sours, MD;  Location: [place];  Service: Orthopedics;  Laterality: Left;  Left   . Joint replacement      bilateral knees, left knee  . Hip arthroplasty Left 06/13/2013    Procedure: ARTHROPLASTY BIPOLAR HIP; Injection left shoulder;  Surgeon: Johnny Bridge, MD;  Location: [place];  Service: Orthopedics;  Laterality: Left;   Social History:   reports that she has quit smoking. She has never used smokeless tobacco. She reports that she does not drink alcohol or use illicit drugs.  Family  History  Problem Relation Age of Onset  . Heart attack Mother   . Parkinsonism Father   . Diabetes Brother     Medications: Patient's Medications  New Prescriptions   No medications on file  Previous Medications   ACETAMINOPHEN (TYLENOL) 500 MG TABLET    Take 500 mg by mouth every 6 (six) hours as needed for pain.   ALPRAZOLAM (XANAX) 0.5 MG TABLET    Take 0.25 mg by mouth at bedtime as needed for sleep.   ALUM & MAG HYDROXIDE-SIMETH (MAALOX/MYLANTA) 200-200-20 MG/5ML SUSPENSION    Take 30 mLs by mouth every 4 (four) hours as needed for indigestion.   ASPIRIN EC 81 MG TABLET    Take 81 mg by mouth daily.   BISACODYL (DULCOLAX) 10 MG SUPPOSITORY    Place 1 suppository (10 mg total) rectally daily as needed for moderate constipation.   CHOLECALCIFEROL (VITAMIN D) 1000 UNITS  TABLET    Take 2,000 Units by mouth daily.   DOCUSATE SODIUM 100 MG CAPS    Take 100 mg by mouth 2 (two) times daily.   FERROUS GLUCONATE (IRON) 240 (27 FE) MG TABS    Take 1 tablet by mouth daily.    HYDROMORPHONE (DILAUDID) 2 MG TABLET    Take 2 mg by mouth every 4 (four) hours as needed for severe pain.   MENTHOL-CETYLPYRIDINIUM (CEPACOL) 3 MG LOZENGE    Take 1 lozenge (3 mg total) by mouth as needed for sore throat (sore throat).   METOPROLOL (LOPRESSOR) 50 MG TABLET    Take 25-50 mg by mouth 2 (two) times daily. Take 50 MG in the morning and take 25 MG at bedtime.   OMEPRAZOLE (PRILOSEC) 40 MG CAPSULE    Take 40 mg by mouth 2 (two) times daily.    POLYETHYLENE GLYCOL (MIRALAX / GLYCOLAX) PACKET    Take 17 g by mouth daily.   ROSUVASTATIN (CRESTOR) 10 MG TABLET    Take 10 mg by mouth daily.   SENNA (SENOKOT) 8.6 MG TABS    Take 1 tablet by mouth at bedtime as needed for mild constipation.    SENNOSIDES-DOCUSATE SODIUM (SENOKOT-S) 8.6-50 MG TABLET    Take 2 tablets by mouth daily.   VALACYCLOVIR (VALTREX) 500 MG TABLET    Take 1,000 mg by mouth at bedtime.    VENLAFAXINE XR (EFFEXOR-XR) 75 MG 24 HR CAPSULE    Take 75  mg by mouth at bedtime.   Modified Medications   No medications on file  Discontinued Medications   ENOXAPARIN (LOVENOX) 40 MG/0.4ML INJECTION    Inject 0.4 mLs (40 mg total) into the skin daily.     Physical Exam: Physical Exam  Constitutional: She is oriented to person, place, and time. She appears well-developed and well-nourished. No distress.  HENT:  Head: Normocephalic and atraumatic.  Right Ear: External ear normal.  Left Ear: External ear normal.  Nose: Nose normal.  Mouth/Throat: Oropharynx is clear and moist. No oropharyngeal exudate.  Eyes: Conjunctivae and EOM are normal. Pupils are equal, round, and reactive to light. Right eye exhibits no discharge. Left eye exhibits no discharge. No scleral icterus.  Neck: Normal range of motion. Neck supple. No JVD present. No tracheal deviation present. No thyromegaly present.  Cardiovascular: Normal rate, regular rhythm, normal heart sounds and intact distal pulses.   No murmur heard. HR 90s at rest.   Pulmonary/Chest: Effort normal and breath sounds normal. No stridor. No respiratory distress. She has no wheezes. She has no rales. She exhibits no tenderness.  Abdominal: Soft. Bowel sounds are normal. She exhibits no distension. There is no tenderness. There is no rebound and no guarding.  Musculoskeletal: She exhibits edema and tenderness.  Left hip and left leg. Left shoulder pain-chronic.   Lymphadenopathy:    She has no cervical adenopathy.  Neurological: She is alert and oriented to person, place, and time. She has normal reflexes. No cranial nerve deficit. She exhibits normal muscle tone. Coordination normal.  Skin: Skin is warm and dry. No rash noted. She is not diaphoretic. No erythema. There is pallor.  Left hip surgical incision healed.   Psychiatric: She has a normal mood and affect. Her behavior is normal. Judgment and thought content normal.    Filed Vitals:   07/19/13 1109  BP: 120/70  Pulse: 68  Temp: 98.2 F  (36.8 C)  TempSrc: Tympanic  Resp: 18      Labs  reviewed: Basic Metabolic Panel:  Recent Labs  06/13/13 0445 06/14/13 0647 06/15/13 0629  NA 136 134* 136  K 4.5 4.2 4.3  CL 103 102 102  CO2 25 25 25   GLUCOSE 108* 139* 111*  BUN 13 8 10   CREATININE 0.57 0.50 0.54  CALCIUM 8.2* 8.5 8.2*   Liver Function Tests:  Recent Labs  06/12/13 1844  AST 25  ALT 20  ALKPHOS 73  BILITOT 0.3  PROT 7.1  ALBUMIN 4.0   CBC:  Recent Labs  06/12/13 1844  06/14/13 0647 06/15/13 0629 06/16/13 0430 06/29/13  WBC 12.0*  < > 9.5 8.7 6.5 4.4  NEUTROABS 8.6*  --   --   --   --   --   HGB 13.0  < > 9.2* 8.6* 8.0* 9.3*  HCT 37.6  < > 26.5* 25.6* 23.3* 27*  MCV 96.7  < > 97.1 98.8 98.3  --   PLT 245  < > 185 163 201 573*  < > = values in this interval not displayed.  Past Procedures:  Significant Diagnostic Studies:   06/12/2013 CLINICAL DATA: History of fall complaining of left sided pain. EXAM: CHEST - 1 VIEW IMPRESSION: 1. Probable subsegmental atelectasis in the left lower lobe. In the setting of recent trauma with chest pain, this may be related to splinting.   06/12/2013 CLINICAL DATA: History of fall complaining of pelvic pain, particularly in the left hip. EXAM: BILATERAL HIP WITH PELVIS IMPRESSION: 1. Acute mildly displaced transcervical femoral neck fracture of the left hip.   06/12/2013 CLINICAL DATA: History of trauma from a fall. EXAM: CT HEAD WITHOUT CONTRAST CT CERVICAL SPINE WITHOUT CONTRAST IMPRESSION: 1. No evidence of significant acute traumatic injury to the head, brain or cervical spine. 2. Mild cerebral atrophy with chronic microvascular ischemic changes in the cerebral white matter. 3. Multilevel degenerative disc disease and cervical spondylosis redemonstrated, as above.   06/12/2013 CLINICAL DATA: History of trauma from a fall. EXAM: CT HEAD WITHOUT CONTRAST CT CERVICAL SPINE WITHOUT CONTRAST IMPRESSION: 1. No evidence of significant acute traumatic injury to  the head, brain or cervical spine. 2. Mild cerebral atrophy with chronic microvascular ischemic changes in the cerebral white matter. 3. Multilevel degenerative disc disease and cervical spondylosis redemonstrated, as above.   06/13/2013 CLINICAL DATA: Left hip replacement. EXAM: PORTABLE PELVIS 1-2 VIEWS IMPRESSION: Left hip replacement with good anatomic alignment on AP view.   06/12/2013 CLINICAL DATA: History of fall complaining of left shoulder pain. EXAM: LEFT SHOULDER - 2+ VIEW IMPRESSION: 1. No acute radiographic abnormality of the left shoulder. 2. Findings compatible with rotator cuff disease and osteoarthritis in the glenohumeral and acromioclavicular joints.   06/13/2013 CLINICAL DATA: Hip replacement. EXAM: PORTABLE LEFT HIP - 1 VIEW COMPARISON: AP pelvis 06/13/2013 IMPRESSION: Left hip replacement. Good anatomic alignment.   Assessment/Plan Anemia, iron deficiency The left hip arthroplasty surgery has aggravated her anemia. The patient stated she had blood transfusions with her left shoulder surgery and right knee surgery in the past. No blood transfusion with the left hip arthroplasty. Hgb improved from 8.7 06/21/13 to 9.3 06/29/13, continue Fe supplement, update CBC    Depression with anxiety Managed with Venlafaxine 75mg  qhs and prn Alprazolam 0.25mg  qhs.    Herpes ocular No flare ups, takes Valacyclovir 1000mg  daily    Hypertension Controlled.     S/P Nissen fundoplication (without gastrostomy tube) procedure Managed with Omeprazole 20mg  bid.     Unspecified constipation Stable, takes Colace 100mg  bid, Senna II  nightly and I hs prn, Bisacodyl 10mg  PR daily prn,  continue MiraLax 17gm with 8 Oz water daily.     Tachycardia HR 90s at rest, takes Metoprolol 50mg  in adm and 25mg  in pm.       Family/ Staff Communication: observe the patient  Goals of Care: [place]-here for Rehab.   Labs/tests ordered: CBC

## 2013-07-19 NOTE — Assessment & Plan Note (Signed)
Stable, takes Colace 100mg  bid, Senna II nightly and I hs prn, Bisacodyl 10mg  PR daily prn,  continue MiraLax 17gm with 8 Oz water daily.

## 2013-07-22 DIAGNOSIS — R262 Difficulty in walking, not elsewhere classified: Secondary | ICD-10-CM | POA: Diagnosis not present

## 2013-07-22 DIAGNOSIS — M6281 Muscle weakness (generalized): Secondary | ICD-10-CM | POA: Diagnosis not present

## 2013-07-23 DIAGNOSIS — M6281 Muscle weakness (generalized): Secondary | ICD-10-CM | POA: Diagnosis not present

## 2013-07-23 DIAGNOSIS — R262 Difficulty in walking, not elsewhere classified: Secondary | ICD-10-CM | POA: Diagnosis not present

## 2013-07-26 DIAGNOSIS — R279 Unspecified lack of coordination: Secondary | ICD-10-CM | POA: Diagnosis not present

## 2013-07-26 DIAGNOSIS — Z471 Aftercare following joint replacement surgery: Secondary | ICD-10-CM | POA: Diagnosis not present

## 2013-07-26 DIAGNOSIS — M6281 Muscle weakness (generalized): Secondary | ICD-10-CM | POA: Diagnosis not present

## 2013-08-06 DIAGNOSIS — Z1231 Encounter for screening mammogram for malignant neoplasm of breast: Secondary | ICD-10-CM | POA: Diagnosis not present

## 2013-08-23 DIAGNOSIS — M6281 Muscle weakness (generalized): Secondary | ICD-10-CM | POA: Diagnosis not present

## 2013-08-23 DIAGNOSIS — R262 Difficulty in walking, not elsewhere classified: Secondary | ICD-10-CM | POA: Diagnosis not present

## 2013-08-24 DIAGNOSIS — R262 Difficulty in walking, not elsewhere classified: Secondary | ICD-10-CM | POA: Diagnosis not present

## 2013-08-24 DIAGNOSIS — M6281 Muscle weakness (generalized): Secondary | ICD-10-CM | POA: Diagnosis not present

## 2013-08-27 DIAGNOSIS — R262 Difficulty in walking, not elsewhere classified: Secondary | ICD-10-CM | POA: Diagnosis not present

## 2013-08-27 DIAGNOSIS — M6281 Muscle weakness (generalized): Secondary | ICD-10-CM | POA: Diagnosis not present

## 2013-08-30 DIAGNOSIS — M6281 Muscle weakness (generalized): Secondary | ICD-10-CM | POA: Diagnosis not present

## 2013-08-30 DIAGNOSIS — R262 Difficulty in walking, not elsewhere classified: Secondary | ICD-10-CM | POA: Diagnosis not present

## 2013-08-31 DIAGNOSIS — R262 Difficulty in walking, not elsewhere classified: Secondary | ICD-10-CM | POA: Diagnosis not present

## 2013-08-31 DIAGNOSIS — M6281 Muscle weakness (generalized): Secondary | ICD-10-CM | POA: Diagnosis not present

## 2013-09-01 DIAGNOSIS — Z947 Corneal transplant status: Secondary | ICD-10-CM | POA: Diagnosis not present

## 2013-09-03 DIAGNOSIS — R262 Difficulty in walking, not elsewhere classified: Secondary | ICD-10-CM | POA: Diagnosis not present

## 2013-09-03 DIAGNOSIS — M6281 Muscle weakness (generalized): Secondary | ICD-10-CM | POA: Diagnosis not present

## 2013-09-06 DIAGNOSIS — M6281 Muscle weakness (generalized): Secondary | ICD-10-CM | POA: Diagnosis not present

## 2013-09-06 DIAGNOSIS — R262 Difficulty in walking, not elsewhere classified: Secondary | ICD-10-CM | POA: Diagnosis not present

## 2013-09-10 DIAGNOSIS — R262 Difficulty in walking, not elsewhere classified: Secondary | ICD-10-CM | POA: Diagnosis not present

## 2013-09-10 DIAGNOSIS — M6281 Muscle weakness (generalized): Secondary | ICD-10-CM | POA: Diagnosis not present

## 2013-09-14 DIAGNOSIS — R262 Difficulty in walking, not elsewhere classified: Secondary | ICD-10-CM | POA: Diagnosis not present

## 2013-09-14 DIAGNOSIS — M6281 Muscle weakness (generalized): Secondary | ICD-10-CM | POA: Diagnosis not present

## 2013-10-04 DIAGNOSIS — H40019 Open angle with borderline findings, low risk, unspecified eye: Secondary | ICD-10-CM | POA: Diagnosis not present

## 2013-10-04 DIAGNOSIS — H02409 Unspecified ptosis of unspecified eyelid: Secondary | ICD-10-CM | POA: Diagnosis not present

## 2013-10-04 DIAGNOSIS — Z947 Corneal transplant status: Secondary | ICD-10-CM | POA: Diagnosis not present

## 2013-10-12 DIAGNOSIS — M545 Low back pain, unspecified: Secondary | ICD-10-CM | POA: Diagnosis not present

## 2013-10-12 DIAGNOSIS — M25559 Pain in unspecified hip: Secondary | ICD-10-CM | POA: Diagnosis not present

## 2013-10-14 DIAGNOSIS — M6530 Trigger finger, unspecified finger: Secondary | ICD-10-CM | POA: Diagnosis not present

## 2013-10-27 DIAGNOSIS — R0789 Other chest pain: Secondary | ICD-10-CM | POA: Diagnosis not present

## 2013-10-27 DIAGNOSIS — M79609 Pain in unspecified limb: Secondary | ICD-10-CM | POA: Diagnosis not present

## 2013-11-30 DIAGNOSIS — M545 Low back pain, unspecified: Secondary | ICD-10-CM | POA: Diagnosis not present

## 2013-11-30 DIAGNOSIS — M25569 Pain in unspecified knee: Secondary | ICD-10-CM | POA: Diagnosis not present

## 2013-11-30 DIAGNOSIS — M25559 Pain in unspecified hip: Secondary | ICD-10-CM | POA: Diagnosis not present

## 2013-12-10 DIAGNOSIS — E7800 Pure hypercholesterolemia, unspecified: Secondary | ICD-10-CM | POA: Diagnosis not present

## 2013-12-10 DIAGNOSIS — Z23 Encounter for immunization: Secondary | ICD-10-CM | POA: Diagnosis not present

## 2013-12-10 DIAGNOSIS — I10 Essential (primary) hypertension: Secondary | ICD-10-CM | POA: Diagnosis not present

## 2013-12-10 DIAGNOSIS — Z1331 Encounter for screening for depression: Secondary | ICD-10-CM | POA: Diagnosis not present

## 2013-12-10 DIAGNOSIS — K219 Gastro-esophageal reflux disease without esophagitis: Secondary | ICD-10-CM | POA: Diagnosis not present

## 2013-12-10 DIAGNOSIS — Z0000 Encounter for general adult medical examination without abnormal findings: Secondary | ICD-10-CM | POA: Diagnosis not present

## 2013-12-10 DIAGNOSIS — R0789 Other chest pain: Secondary | ICD-10-CM | POA: Diagnosis not present

## 2013-12-10 DIAGNOSIS — E559 Vitamin D deficiency, unspecified: Secondary | ICD-10-CM | POA: Diagnosis not present

## 2013-12-16 ENCOUNTER — Other Ambulatory Visit (HOSPITAL_COMMUNITY): Payer: Self-pay | Admitting: Orthopedic Surgery

## 2013-12-16 DIAGNOSIS — M24052 Loose body in left hip: Secondary | ICD-10-CM

## 2013-12-16 DIAGNOSIS — M25559 Pain in unspecified hip: Secondary | ICD-10-CM | POA: Diagnosis not present

## 2013-12-30 DIAGNOSIS — H02409 Unspecified ptosis of unspecified eyelid: Secondary | ICD-10-CM | POA: Diagnosis not present

## 2014-01-03 ENCOUNTER — Encounter (HOSPITAL_COMMUNITY)
Admission: RE | Admit: 2014-01-03 | Discharge: 2014-01-03 | Disposition: A | Discharge status: Home / Self Care | Payer: Medicare Other | Source: Ambulatory Visit | Attending: Orthopedic Surgery | Admitting: Orthopedic Surgery

## 2014-01-03 DIAGNOSIS — Z96649 Presence of unspecified artificial hip joint: Secondary | ICD-10-CM | POA: Diagnosis not present

## 2014-01-03 DIAGNOSIS — M24052 Loose body in left hip: Secondary | ICD-10-CM

## 2014-01-03 DIAGNOSIS — M24059 Loose body in unspecified hip: Secondary | ICD-10-CM | POA: Insufficient documentation

## 2014-01-03 DIAGNOSIS — M259 Joint disorder, unspecified: Secondary | ICD-10-CM | POA: Diagnosis not present

## 2014-01-03 MED ORDER — TECHNETIUM TC 99M MEDRONATE IV KIT
27.5000 | PACK | Freq: Once | INTRAVENOUS | Status: AC | PRN
  Administered 2014-01-03: 27.5 via INTRAVENOUS

## 2014-01-11 DIAGNOSIS — M47817 Spondylosis without myelopathy or radiculopathy, lumbosacral region: Secondary | ICD-10-CM | POA: Diagnosis not present

## 2014-01-12 DIAGNOSIS — H16149 Punctate keratitis, unspecified eye: Secondary | ICD-10-CM | POA: Diagnosis not present

## 2014-01-12 DIAGNOSIS — H02209 Unspecified lagophthalmos unspecified eye, unspecified eyelid: Secondary | ICD-10-CM | POA: Diagnosis not present

## 2014-01-24 DIAGNOSIS — H16149 Punctate keratitis, unspecified eye: Secondary | ICD-10-CM | POA: Diagnosis not present

## 2014-01-24 DIAGNOSIS — H02209 Unspecified lagophthalmos unspecified eye, unspecified eyelid: Secondary | ICD-10-CM | POA: Diagnosis not present

## 2014-02-01 DIAGNOSIS — IMO0002 Reserved for concepts with insufficient information to code with codable children: Secondary | ICD-10-CM | POA: Diagnosis not present

## 2014-02-01 DIAGNOSIS — M545 Low back pain, unspecified: Secondary | ICD-10-CM | POA: Diagnosis not present

## 2014-02-01 DIAGNOSIS — M48061 Spinal stenosis, lumbar region without neurogenic claudication: Secondary | ICD-10-CM | POA: Diagnosis not present

## 2014-02-01 DIAGNOSIS — M47817 Spondylosis without myelopathy or radiculopathy, lumbosacral region: Secondary | ICD-10-CM | POA: Diagnosis not present

## 2014-02-01 DIAGNOSIS — M5137 Other intervertebral disc degeneration, lumbosacral region: Secondary | ICD-10-CM | POA: Diagnosis not present

## 2014-02-01 DIAGNOSIS — M546 Pain in thoracic spine: Secondary | ICD-10-CM | POA: Diagnosis not present

## 2014-02-14 DIAGNOSIS — M6530 Trigger finger, unspecified finger: Secondary | ICD-10-CM | POA: Diagnosis not present

## 2014-02-22 DIAGNOSIS — M25569 Pain in unspecified knee: Secondary | ICD-10-CM | POA: Diagnosis not present

## 2014-02-22 DIAGNOSIS — S43429A Sprain of unspecified rotator cuff capsule, initial encounter: Secondary | ICD-10-CM | POA: Diagnosis not present

## 2014-02-22 DIAGNOSIS — M25519 Pain in unspecified shoulder: Secondary | ICD-10-CM | POA: Diagnosis not present

## 2014-03-01 DIAGNOSIS — M25519 Pain in unspecified shoulder: Secondary | ICD-10-CM | POA: Diagnosis not present

## 2014-03-08 DIAGNOSIS — K140 Glossitis: Secondary | ICD-10-CM | POA: Diagnosis not present

## 2014-03-09 DIAGNOSIS — IMO0002 Reserved for concepts with insufficient information to code with codable children: Secondary | ICD-10-CM | POA: Diagnosis not present

## 2014-03-09 DIAGNOSIS — M48061 Spinal stenosis, lumbar region without neurogenic claudication: Secondary | ICD-10-CM | POA: Diagnosis not present

## 2014-03-09 DIAGNOSIS — M5137 Other intervertebral disc degeneration, lumbosacral region: Secondary | ICD-10-CM | POA: Diagnosis not present

## 2014-03-09 DIAGNOSIS — M47817 Spondylosis without myelopathy or radiculopathy, lumbosacral region: Secondary | ICD-10-CM | POA: Diagnosis not present

## 2014-03-21 DIAGNOSIS — H2640 Unspecified secondary cataract: Secondary | ICD-10-CM | POA: Diagnosis not present

## 2014-03-21 DIAGNOSIS — Z947 Corneal transplant status: Secondary | ICD-10-CM | POA: Diagnosis not present

## 2014-03-21 DIAGNOSIS — Z961 Presence of intraocular lens: Secondary | ICD-10-CM | POA: Diagnosis not present

## 2014-03-21 DIAGNOSIS — H04129 Dry eye syndrome of unspecified lacrimal gland: Secondary | ICD-10-CM | POA: Diagnosis not present

## 2014-03-23 DIAGNOSIS — R319 Hematuria, unspecified: Secondary | ICD-10-CM | POA: Diagnosis not present

## 2014-04-11 DIAGNOSIS — Z23 Encounter for immunization: Secondary | ICD-10-CM | POA: Diagnosis not present

## 2014-04-12 DIAGNOSIS — M5442 Lumbago with sciatica, left side: Secondary | ICD-10-CM | POA: Diagnosis not present

## 2014-04-12 DIAGNOSIS — M4156 Other secondary scoliosis, lumbar region: Secondary | ICD-10-CM | POA: Diagnosis not present

## 2014-04-12 DIAGNOSIS — M5136 Other intervertebral disc degeneration, lumbar region: Secondary | ICD-10-CM | POA: Diagnosis not present

## 2014-04-12 DIAGNOSIS — M5416 Radiculopathy, lumbar region: Secondary | ICD-10-CM | POA: Diagnosis not present

## 2014-04-12 DIAGNOSIS — M4726 Other spondylosis with radiculopathy, lumbar region: Secondary | ICD-10-CM | POA: Diagnosis not present

## 2014-04-12 DIAGNOSIS — M4806 Spinal stenosis, lumbar region: Secondary | ICD-10-CM | POA: Diagnosis not present

## 2014-04-13 DIAGNOSIS — Z947 Corneal transplant status: Secondary | ICD-10-CM | POA: Diagnosis not present

## 2014-04-13 DIAGNOSIS — H04123 Dry eye syndrome of bilateral lacrimal glands: Secondary | ICD-10-CM | POA: Diagnosis not present

## 2014-04-13 DIAGNOSIS — Z961 Presence of intraocular lens: Secondary | ICD-10-CM | POA: Diagnosis not present

## 2014-04-13 DIAGNOSIS — H26491 Other secondary cataract, right eye: Secondary | ICD-10-CM | POA: Diagnosis not present

## 2014-04-14 DIAGNOSIS — R300 Dysuria: Secondary | ICD-10-CM | POA: Diagnosis not present

## 2014-04-14 DIAGNOSIS — Z79899 Other long term (current) drug therapy: Secondary | ICD-10-CM | POA: Diagnosis not present

## 2014-04-21 ENCOUNTER — Encounter: Payer: Self-pay | Admitting: Neurology

## 2014-04-21 ENCOUNTER — Ambulatory Visit (INDEPENDENT_AMBULATORY_CARE_PROVIDER_SITE_OTHER): Payer: Medicare Other | Admitting: Neurology

## 2014-04-21 VITALS — BP 132/70 | HR 66 | Ht 62.0 in | Wt 136.8 lb

## 2014-04-21 DIAGNOSIS — R269 Unspecified abnormalities of gait and mobility: Secondary | ICD-10-CM | POA: Diagnosis not present

## 2014-04-21 HISTORY — DX: Unspecified abnormalities of gait and mobility: R26.9

## 2014-04-21 NOTE — Patient Instructions (Signed)

## 2014-04-21 NOTE — Progress Notes (Signed)
Reason for visit: Facial weakness  Kristin Solomon is a 80 y.o. female  History of present illness:  Kristin Solomon is an 80 year old right-handed white female with a history of corneal surgery and bilateral cataract surgery in the past. 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 The patient has noted some mild ptosis of both eyes, and she has noted for several years that when she puts eyedrops in her left eye, the drops will roll out of the eye more easily than on the right side. The patient however has not noted any issues with facial asymmetry, speech changes, double vision, or loss of vision. She has not had ptosis to the degree that it impairs her vision or closes the eye. The patient has been seen recently by her ophthalmologist, and she was noted to have some very mild weakness with eye closure on the left. She is sent to this office for an evaluation. The patient does have a history of some gait instability, and she had a fall in December 2014. The patient fractured her left hip, requiring surgery, and she has had prior knee surgery with a joint replacement on the left as well. She has noted some worsening of balance since the fall. The patient denies any numbness or weakness of the extremities, she denies problems controlling the bowels or bladder. She does have a history of prior lumbosacral spine surgery and she has had increased back pain since the fall in December 2014. The patient has not had any numbness of the face. She denies any drooling issues or problems with retaining liquids in the mouth. She comes to this office for further evaluation. There have been no reports of headache or dizziness.  Past Medical History  Diagnosis Date  . Herpes ocular     history of  . GERD (gastroesophageal reflux disease)   . Depression   . High cholesterol   . Hypertension     under control;  has been on med. x 10 yr.  . History of hiatal hernia   . Osteoarthritis of knee     bilateral  . Anemia, iron deficiency   . Dental crowns present   . Fracture of femoral neck, left 06/12/2013  . Stenosing tenosynovitis of finger 08/2012    right index finger  . Sciatic pain   . Gait disorder 04/21/2014    Past Surgical History  Procedure Laterality Date  . Lumbar laminectomy/decompression microdiscectomy  08/29/2010    L2-S1  . Shoulder arthroscopy with rotator cuff repair and subacromial decompression Right 01/01/2000  . Total knee arthroplasty Left 05/14/2004  . Abdominal hysterectomy  1995    partial  . Laparoscopic nissen fundoplication  4/33/2951  . Descemets stripping automated endothelial keratoplasty Left 03/14/2011  . Tonsillectomy      as child  . Colonoscopy    . Total knee arthroplasty  12/23/2011    Procedure: TOTAL KNEE ARTHROPLASTY;  Surgeon: Lorn Junes, MD;  Location: [place];  Service: Orthopedics;  Laterality: Right;  DR [place] THIS CASE  . Knee arthroscopy Right 05/11/2001  . Trigger finger release Right 07/21/2007    thumb  . Carpal tunnel release Right 07/21/2007  . Trigger finger release Left 09/24/2007    middle finger  . Carpal tunnel release Left 09/24/2007  . Trigger finger release Right 03/10/2008    ring and little fingers  .  Descemets stripping automated endothelial keratoplasty Right 08/20/2012  . Trigger finger release Right 09/02/2012    Procedure: RELEASE TRIGGER FINGER/A-1 PULLEY RIGHT INDEX FINGER;  Surgeon: Wynonia Sours, MD;  Location: [place];  Service: Orthopedics;  Laterality: Right;  . Trigger finger release Left 12/22/2012    Procedure: RELEASE TRIGGER FINGER/A-1 PULLEY LEFT RING FINGER;  Surgeon: Wynonia Sours, MD;  Location: [place];  Service: Orthopedics;  Laterality: Left;  Left   . Joint replacement      bilateral knees, left knee  . Hip arthroplasty Left 06/13/2013    Procedure:  ARTHROPLASTY BIPOLAR HIP; Injection left shoulder;  Surgeon: Johnny Bridge, MD;  Location: [place];  Service: Orthopedics;  Laterality: Left;    Family History  Problem Relation Age of Onset  . Heart attack Mother   . Parkinsonism Father   . Diabetes Brother     Social history:  reports that she has quit smoking. She has never used smokeless tobacco. She reports that she does not drink alcohol or use illicit drugs.  Medications:  Current Outpatient Prescriptions on File Prior to Visit  Medication Sig Dispense Refill  . acetaminophen (TYLENOL) 500 MG tablet Take 500 mg by mouth every 6 (six) hours as needed for pain.      Marland Kitchen ALPRAZolam (XANAX) 0.5 MG tablet Take 0.25 mg by mouth at bedtime as needed for sleep.      Marland Kitchen alum & mag hydroxide-simeth (MAALOX/MYLANTA) 200-200-20 MG/5ML suspension Take 30 mLs by mouth every 4 (four) hours as needed for indigestion.  355 mL  0  . aspirin EC 81 MG tablet Take 81 mg by mouth daily.      . cholecalciferol (VITAMIN D) 1000 UNITS tablet Take 2,000 Units by mouth daily.      Marland Kitchen docusate sodium 100 MG CAPS Take 100 mg by mouth 2 (two) times daily.  10 capsule  0  . menthol-cetylpyridinium (CEPACOL) 3 MG lozenge Take 1 lozenge (3 mg total) by mouth as needed for sore throat (sore throat).  30 tablet  0  . metoprolol (LOPRESSOR) 50 MG tablet Take 25-50 mg by mouth 2 (two) times daily. Take 50 MG in the morning and take 25 MG at bedtime.      Marland Kitchen omeprazole (PRILOSEC) 40 MG capsule Take 40 mg by mouth 2 (two) times daily.       . rosuvastatin (CRESTOR) 10 MG tablet Take 10 mg by mouth daily.      . sennosides-docusate sodium (SENOKOT-S) 8.6-50 MG tablet Take 2 tablets by mouth daily.  30 tablet  1  . valACYclovir (VALTREX) 500 MG tablet Take 1,000 mg by mouth at bedtime.       Marland Kitchen venlafaxine XR (EFFEXOR-XR) 75 MG 24 hr capsule Take 75 mg by mouth at bedtime.        No current facility-administered medications on file prior to visit.      Allergies  Allergen  Reactions  . Morphine And Related Other (See Comments)    AGITATION, STRANGE DREAMS  . Scopace [Scopolamine] Other (See Comments)    MENTAL CHANGES    ROS:  Out of a complete 14 system review of symptoms, the patient complains only of the following symptoms, and all other reviewed systems are negative.  Gait instability  Blood pressure 132/70, pulse 66, height 5\' 2"  (1.575 m), weight 136 lb 12.8 oz (62.052 kg).  Physical Exam  General: The patient is alert and cooperative at the time of  the examination.  Eyes: Pupils are equal, round, and reactive to light. Discs are flat bilaterally.  Neck: The neck is supple, no carotid bruits are noted.  Respiratory: The respiratory examination is clear.  Cardiovascular: The cardiovascular examination reveals a regular rate and rhythm, no obvious murmurs or rubs are noted.  Skin: Extremities are without significant edema.  Neurologic Exam  Mental status: The patient is alert and oriented x 3 at the time of the examination. The patient has apparent normal recent and remote memory, with an apparently normal attention span and concentration ability.  Cranial nerves: Facial symmetry is present. Very mild, relatively symmetric ptosis is noted bilaterally. There is good sensation of the face to pinprick and soft touch bilaterally. The strength of the facial muscles and the muscles to head turning and shoulder shrug are normal bilaterally. Speech is well enunciated, no aphasia or dysarthria is noted. Extraocular movements are full. Visual fields are full. The tongue is midline, and the patient has symmetric elevation of the soft palate. No obvious hearing deficits are noted.  Motor: The motor testing reveals 5 over 5 strength of all 4 extremities. Good symmetric motor tone is noted throughout.  Sensory: Sensory testing is intact to pinprick, soft touch, vibration sensation, and position sense on all 4 extremities. No evidence of extinction is  noted.  Coordination: Cerebellar testing reveals good finger-nose-finger and heel-to-shin bilaterally.  Gait and station: Gait is slightly wide-based, unsteady. The patient uses a walker for ambulation. Tandem gait was not tested. The patient has a slight tendency to lean backwards. Romberg is negative. No drift is seen.  Reflexes: Deep tendon reflexes are symmetric and normal bilaterally. Toes are downgoing bilaterally.   CT head and cervical spine 06/12/13:  IMPRESSION:  1. No evidence of significant acute traumatic injury to the head,  brain or cervical spine.  2. Mild cerebral atrophy with chronic microvascular ischemic changes  in the cerebral white matter.  3. Multilevel degenerative disc disease and cervical spondylosis  redemonstrated, as above.     Assessment/Plan:  1. Reported left facial weakness  2. Gait instability  Clinical examination reveals only benign symmetric ptosis, possibly some very minimal eye closure weakness on the left. There is no evidence of a more generalized facial weakness issue. I suspect that the changes seen are related to aging and due to local phenomenon such as the shingle infection and prior surgery. I have not recommended a neurologic workup at this time, but the patient is to be vigilant, and if she notes any changes in facial symmetry or increased ptosis, she is to contact our office. The patient has had a CT scan of the brain done in December 2014 that was unremarkable. We will see the patient back if needed.  Jill Alexanders MD 04/21/2014 7:36 PM  [facility] [address] [place], [place] [address]  Phone [phone] Fax [phone]

## 2014-04-29 DIAGNOSIS — L570 Actinic keratosis: Secondary | ICD-10-CM | POA: Diagnosis not present

## 2014-04-29 DIAGNOSIS — Z85828 Personal history of other malignant neoplasm of skin: Secondary | ICD-10-CM | POA: Diagnosis not present

## 2014-04-29 DIAGNOSIS — L821 Other seborrheic keratosis: Secondary | ICD-10-CM | POA: Diagnosis not present

## 2014-05-11 ENCOUNTER — Encounter: Payer: Self-pay | Admitting: Neurology

## 2014-05-16 DIAGNOSIS — M25552 Pain in left hip: Secondary | ICD-10-CM | POA: Diagnosis not present

## 2014-05-17 ENCOUNTER — Encounter: Payer: Self-pay | Admitting: Neurology

## 2014-05-17 ENCOUNTER — Other Ambulatory Visit: Payer: Self-pay | Admitting: Orthopedic Surgery

## 2014-05-17 DIAGNOSIS — M25552 Pain in left hip: Secondary | ICD-10-CM

## 2014-05-24 DIAGNOSIS — M4726 Other spondylosis with radiculopathy, lumbar region: Secondary | ICD-10-CM | POA: Diagnosis not present

## 2014-05-24 DIAGNOSIS — M5416 Radiculopathy, lumbar region: Secondary | ICD-10-CM | POA: Diagnosis not present

## 2014-05-24 DIAGNOSIS — Z6825 Body mass index (BMI) 25.0-25.9, adult: Secondary | ICD-10-CM | POA: Diagnosis not present

## 2014-05-24 DIAGNOSIS — I10 Essential (primary) hypertension: Secondary | ICD-10-CM | POA: Diagnosis not present

## 2014-05-24 DIAGNOSIS — M4806 Spinal stenosis, lumbar region: Secondary | ICD-10-CM | POA: Diagnosis not present

## 2014-05-24 DIAGNOSIS — M5442 Lumbago with sciatica, left side: Secondary | ICD-10-CM | POA: Diagnosis not present

## 2014-05-24 DIAGNOSIS — M5136 Other intervertebral disc degeneration, lumbar region: Secondary | ICD-10-CM | POA: Diagnosis not present

## 2014-05-25 ENCOUNTER — Other Ambulatory Visit: Payer: Self-pay | Admitting: Orthopedic Surgery

## 2014-05-25 ENCOUNTER — Ambulatory Visit
Admission: RE | Admit: 2014-05-25 | Discharge: 2014-05-25 | Disposition: A | Discharge status: Home / Self Care | Payer: Medicare Other | Source: Ambulatory Visit | Attending: Orthopedic Surgery | Admitting: Orthopedic Surgery

## 2014-05-25 DIAGNOSIS — M24052 Loose body in left hip: Secondary | ICD-10-CM | POA: Diagnosis not present

## 2014-05-25 DIAGNOSIS — T8452XA Infection and inflammatory reaction due to internal left hip prosthesis, initial encounter: Secondary | ICD-10-CM | POA: Diagnosis not present

## 2014-05-25 DIAGNOSIS — M25552 Pain in left hip: Secondary | ICD-10-CM

## 2014-05-25 LAB — SYNOVIAL CELL COUNT + DIFF, W/ CRYSTALS
CRYSTALS FLUID: NONE SEEN
EOSINOPHILS-SYNOVIAL: 0 % (ref 0–1)
Lymphocytes-Synovial Fld: 20 % (ref 0–20)
Monocyte/Macrophage: 22 % — ABNORMAL LOW (ref 50–90)
Neutrophil, Synovial: 58 % — ABNORMAL HIGH (ref 0–25)
WBC, Synovial: 210 cu mm — ABNORMAL HIGH (ref 0–200)

## 2014-05-25 LAB — GRAM STAIN: GRAM STAIN: NONE SEEN

## 2014-05-25 MED ORDER — IOHEXOL 180 MG/ML  SOLN: 1.0000 mL | Freq: Once | INTRAMUSCULAR | Status: AC | PRN

## 2014-06-07 DIAGNOSIS — T8484XD Pain due to internal orthopedic prosthetic devices, implants and grafts, subsequent encounter: Secondary | ICD-10-CM | POA: Diagnosis not present

## 2014-06-23 ENCOUNTER — Other Ambulatory Visit: Payer: Self-pay | Admitting: Orthopedic Surgery

## 2014-06-25 DIAGNOSIS — R300 Dysuria: Secondary | ICD-10-CM | POA: Diagnosis not present

## 2014-06-27 NOTE — Pre-Procedure Instructions (Signed)
Kristin Solomon  06/27/2014   Your procedure is scheduled on: Friday, July 08, 2014  Report to [facility] Admitting at 10:15 AM.  Call this number if you have problems the morning of surgery: [phone]   Remember:   Do not eat food or drink liquids after midnight Thursday, July 07, 2014   Take these medicines the morning of surgery with A SIP OF WATER:acyclovir (ZOVIRAX),   metoprolol (LOPRESSOR), omeprazole (PRILOSEC), if needed:acetaminophen (TYLENOL) for pain  Stop taking Aspirin, vitamins, and herbal medications. Do not take any NSAIDs ie: Ibuprofen, Advil, Naproxen or any medication containing Aspirin; stop 1 week prior to procedure ( Friday, July 01, 2014)   Do not wear jewelry, make-up or nail polish.  Do not wear lotions, powders, or perfumes. You may not wear deodorant.  Do not shave 48 hours prior to surgery.   Do not bring valuables to the hospital.  [facility] is not responsible for any belongings or valuables.               Contacts, dentures or bridgework may not be worn into surgery.  Leave suitcase in the car. After surgery it may be brought to your room.  For patients admitted to the hospital, discharge time is determined by your treatment team.               Patients discharged the day of surgery will not be allowed to drive home.  Name and phone number of your driver:   Special Instructions:  Special Instructions:Special Instructions: [facility] - Preparing for Surgery  Before surgery, you can play an important role.  Because skin is not sterile, your skin needs to be as free of germs as possible.  You can reduce the number of germs on you skin by washing with CHG (chlorahexidine gluconate) soap before surgery.  CHG is an antiseptic cleaner which kills germs and bonds with the skin to continue killing germs even after washing.  Please DO NOT use if you have an allergy to CHG or antibacterial soaps.  If your skin becomes reddened/irritated  stop using the CHG and inform your nurse when you arrive at Short Stay.  Do not shave (including legs and underarms) for at least 48 hours prior to the first CHG shower.  You may shave your face.  Please follow these instructions carefully:   1.  Shower with CHG Soap the night before surgery and the morning of Surgery.  2.  If you choose to wash your hair, wash your hair first as usual with your normal shampoo.  3.  After you shampoo, rinse your hair and body thoroughly to remove the Shampoo.  4.  Use CHG as you would any other liquid soap.  You can apply chg directly  to the skin and wash gently with scrungie or a clean washcloth.  5.  Apply the CHG Soap to your body ONLY FROM THE NECK DOWN.  Do not use on open wounds or open sores.  Avoid contact with your eyes, ears, mouth and genitals (private parts).  Wash genitals (private parts) with your normal soap.  6.  Wash thoroughly, paying special attention to the area where your surgery will be performed.  7.  Thoroughly rinse your body with warm water from the neck down.  8.  DO NOT shower/wash with your normal soap after using and rinsing off the CHG Soap.  9.  Pat yourself dry with a clean towel.  10.  Wear clean pajamas.            11.  Place clean sheets on your bed the night of your first shower and do not sleep with pets.  Day of Surgery  Do not apply any lotions/deodorants the morning of surgery.  Please wear clean clothes to the hospital/surgery center.   Please read over the following fact sheets that you were given: Pain Booklet, Coughing and Deep Breathing, Blood Transfusion Information, Total Joint Packet, MRSA Information and Surgical Site Infection Prevention

## 2014-06-28 ENCOUNTER — Encounter (HOSPITAL_COMMUNITY): Payer: Self-pay

## 2014-06-28 ENCOUNTER — Encounter (HOSPITAL_COMMUNITY)
Admission: RE | Admit: 2014-06-28 | Discharge: 2014-06-28 | Disposition: A | Discharge status: Home / Self Care | Payer: Medicare Other | Source: Ambulatory Visit | Attending: Orthopedic Surgery | Admitting: Orthopedic Surgery

## 2014-06-28 DIAGNOSIS — I10 Essential (primary) hypertension: Secondary | ICD-10-CM | POA: Insufficient documentation

## 2014-06-28 DIAGNOSIS — M8580 Other specified disorders of bone density and structure, unspecified site: Secondary | ICD-10-CM | POA: Diagnosis not present

## 2014-06-28 DIAGNOSIS — R001 Bradycardia, unspecified: Secondary | ICD-10-CM | POA: Insufficient documentation

## 2014-06-28 DIAGNOSIS — D649 Anemia, unspecified: Secondary | ICD-10-CM | POA: Diagnosis not present

## 2014-06-28 DIAGNOSIS — I517 Cardiomegaly: Secondary | ICD-10-CM | POA: Diagnosis not present

## 2014-06-28 DIAGNOSIS — R918 Other nonspecific abnormal finding of lung field: Secondary | ICD-10-CM | POA: Insufficient documentation

## 2014-06-28 DIAGNOSIS — J984 Other disorders of lung: Secondary | ICD-10-CM | POA: Insufficient documentation

## 2014-06-28 DIAGNOSIS — E785 Hyperlipidemia, unspecified: Secondary | ICD-10-CM | POA: Insufficient documentation

## 2014-06-28 DIAGNOSIS — Z01818 Encounter for other preprocedural examination: Secondary | ICD-10-CM | POA: Insufficient documentation

## 2014-06-28 LAB — URINALYSIS, ROUTINE W REFLEX MICROSCOPIC
BILIRUBIN URINE: NEGATIVE
GLUCOSE, UA: NEGATIVE mg/dL
Hgb urine dipstick: NEGATIVE
KETONES UR: NEGATIVE mg/dL
NITRITE: NEGATIVE
PH: 6 (ref 5.0–8.0)
Protein, ur: NEGATIVE mg/dL
Specific Gravity, Urine: 1.016 (ref 1.005–1.030)
Urobilinogen, UA: 0.2 mg/dL (ref 0.0–1.0)

## 2014-06-28 LAB — BASIC METABOLIC PANEL
Anion gap: 8 (ref 5–15)
BUN: 13 mg/dL (ref 6–23)
CHLORIDE: 103 meq/L (ref 96–112)
CO2: 25 mmol/L (ref 19–32)
CREATININE: 0.69 mg/dL (ref 0.50–1.10)
Calcium: 9.3 mg/dL (ref 8.4–10.5)
GFR calc Af Amer: >90 mL/min (ref >90)
GFR calc non Af Amer: 79 mL/min — ABNORMAL LOW (ref >90)
Glucose, Bld: 90 mg/dL (ref 70–99)
Potassium: 4.4 mmol/L (ref 3.5–5.1)
Sodium: 136 mmol/L (ref 135–145)

## 2014-06-28 LAB — CBC WITH DIFFERENTIAL/PLATELET
BASOS ABS: 0.1 10*3/uL (ref 0.0–0.1)
Basophils Relative: 1 % (ref 0–1)
Eosinophils Absolute: 0.2 10*3/uL (ref 0.0–0.7)
Eosinophils Relative: 3 % (ref 0–5)
HEMATOCRIT: 37.9 % (ref 36.0–46.0)
HEMOGLOBIN: 12.7 g/dL (ref 12.0–15.0)
LYMPHS PCT: 43 % (ref 12–46)
Lymphs Abs: 2.7 10*3/uL (ref 0.7–4.0)
MCH: 32.4 pg (ref 26.0–34.0)
MCHC: 33.5 g/dL (ref 30.0–36.0)
MCV: 96.7 fL (ref 78.0–100.0)
MONOS PCT: 14 % — AB (ref 3–12)
Monocytes Absolute: 0.9 10*3/uL (ref 0.1–1.0)
Neutro Abs: 2.4 10*3/uL (ref 1.7–7.7)
Neutrophils Relative %: 39 % — ABNORMAL LOW (ref 43–77)
Platelets: 295 10*3/uL (ref 150–400)
RBC: 3.92 MIL/uL (ref 3.87–5.11)
RDW: 12.5 % (ref 11.5–15.5)
WBC: 6.3 10*3/uL (ref 4.0–10.5)

## 2014-06-28 LAB — URINE MICROSCOPIC-ADD ON

## 2014-06-28 LAB — TYPE AND SCREEN
ABO/RH(D): A POS
Antibody Screen: NEGATIVE

## 2014-06-28 LAB — APTT: aPTT: 27 seconds (ref 24–37)

## 2014-06-28 LAB — SURGICAL PCR SCREEN
MRSA, PCR: NEGATIVE
Staphylococcus aureus: NEGATIVE

## 2014-06-28 LAB — PROTIME-INR
INR: 0.97 (ref 0.00–1.49)
PROTHROMBIN TIME: 13 s (ref 11.6–15.2)

## 2014-06-28 NOTE — Progress Notes (Signed)
PCP is Dr Inda Merlin States that she saw a Cardiolgist years ago Dr Daneen Schick. Denies having a recent CXR or EKG. Denies having an Echo, stress test, or card cath.

## 2014-06-29 DIAGNOSIS — R300 Dysuria: Secondary | ICD-10-CM | POA: Diagnosis not present

## 2014-06-29 DIAGNOSIS — M25552 Pain in left hip: Secondary | ICD-10-CM | POA: Diagnosis not present

## 2014-06-29 NOTE — Progress Notes (Signed)
Anesthesia Chart Review:  Pt is 81 year old female scheduled for L total hip revision on 07/08/2014 with Dr. Mayer Camel.   PMH includes: HTN, hyperlipidemia, anemia  Preoperative labs reviewed.    Chest x-ray reviewed.  1. No active lung disease. Question emphysema. 2. Partially compressed mid lower thoracic vertebral body since the lateral chest x-ray of June 2013.  EKG: sinus bradycardia (57 bpm)  If no changes, I anticipate pt can proceed with surgery as scheduled.   Willeen Cass, FNP-BC [facility]/Anesthesiology Phone: [phone] 06/29/2014 4:23 PM

## 2014-06-30 DIAGNOSIS — M1612 Unilateral primary osteoarthritis, left hip: Secondary | ICD-10-CM | POA: Diagnosis not present

## 2014-06-30 DIAGNOSIS — M25552 Pain in left hip: Secondary | ICD-10-CM | POA: Diagnosis not present

## 2014-07-05 NOTE — H&P (Signed)
TOTAL HIP REVISION ADMISSION H&P  Patient is admitted for left revision total hip arthroplasty.  Subjective:  Chief Complaint: left hip pain  HPI: Kristin Solomon, 81 y.o. female, has a history of pain and functional disability in the left hip due to trauma and patient has failed non-surgical conservative treatments for greater than 12 weeks to include NSAID's and/or analgesics, flexibility and strengthening excercises, supervised PT with diminished ADL's post treatment, use of assistive devices and activity modification. The indications for the revision total hip arthroplasty are progressive or substantial periprosthetic bone loss.  Onset of symptoms was gradual starting 2 years ago with gradually worsening course since that time.  Prior procedures on the left hip include hemi-arthroplasty.  Patient currently rates pain in the left hip at 10 out of 10 with activity.  There is worsening of pain with activity and weight bearing, pain that interfers with activities of daily living and pain with passive range of motion. Patient has evidence of joint space narrowing by imaging studies.  This condition presents safety issues increasing the risk of falls.   There is no current active infection.  Patient Active Problem List   Diagnosis Date Noted  . Gait disorder 04/21/2014  . Unspecified constipation 06/21/2013  . Depression with anxiety 06/21/2013  . Diverticulosis of colon without hemorrhage 06/13/2013  . S/P Nissen fundoplication (without gastrostomy tube) procedure 06/13/2013  . Lactose intolerance 06/13/2013  . PVCs (premature ventricular contractions) 06/13/2013  . Fracture of femoral neck, left 06/12/2013  . Hip fracture requiring operative repair 06/12/2013  . Closed left hip fracture 06/12/2013  . Leukocytosis, unspecified 06/12/2013  . Postoperative anemia due to acute blood loss 12/25/2011  . Lumbar radiculopathy, chronic 12/23/2011  . Hyperlipidemia   . Hypertension   .  Tachycardia   . Herpes ocular   . Anemia, iron deficiency   . Right knee DJD   . Status post total knee replacement    Past Medical History  Diagnosis Date  . Herpes ocular     history of  . GERD (gastroesophageal reflux disease)   . Depression   . High cholesterol   . Hypertension     under control; has been on med. x 10 yr.  . History of hiatal hernia   . Osteoarthritis of knee     bilateral  . Anemia, iron deficiency   . Dental crowns present   . Fracture of femoral neck, left 06/12/2013  . Stenosing tenosynovitis of finger 08/2012    right index finger  . Sciatic pain   . Gait disorder 04/21/2014    Past Surgical History  Procedure Laterality Date  . Lumbar laminectomy/decompression microdiscectomy  08/29/2010    L2-S1  . Shoulder arthroscopy with rotator cuff repair and subacromial decompression Right 01/01/2000  . Total knee arthroplasty Left 05/14/2004  . Abdominal hysterectomy  1995    partial  . Laparoscopic nissen fundoplication  11/26/5407  . Descemets stripping automated endothelial keratoplasty Left 03/14/2011  . Tonsillectomy      as child  . Colonoscopy    . Total knee arthroplasty  12/23/2011    Procedure: TOTAL KNEE ARTHROPLASTY;  Surgeon: Lorn Junes, MD;  Location: [place];  Service: Orthopedics;  Laterality: Right;  DR [place] THIS CASE  . Knee arthroscopy Right 05/11/2001  . Trigger finger release Right 07/21/2007    thumb  . Carpal tunnel release Right 07/21/2007  . Trigger finger release Left 09/24/2007    middle finger  .  Carpal tunnel release Left 09/24/2007  . Trigger finger release Right 03/10/2008    ring and little fingers  . Descemets stripping automated endothelial keratoplasty Right 08/20/2012  . Trigger finger release Right 09/02/2012    Procedure: RELEASE TRIGGER FINGER/A-1 PULLEY RIGHT INDEX FINGER;  Surgeon: Wynonia Sours, MD;  Location: [place];  Service: Orthopedics;  Laterality: Right;  . Trigger finger  release Left 12/22/2012    Procedure: RELEASE TRIGGER FINGER/A-1 PULLEY LEFT RING FINGER;  Surgeon: Wynonia Sours, MD;  Location: [place];  Service: Orthopedics;  Laterality: Left;  Left   . Joint replacement      bilateral knees, left knee  . Hip arthroplasty Left 06/13/2013    Procedure: ARTHROPLASTY BIPOLAR HIP; Injection left shoulder;  Surgeon: Johnny Bridge, MD;  Location: [place];  Service: Orthopedics;  Laterality: Left;  . Hernia repair    . Eye surgery Bilateral     No prescriptions prior to admission   Allergies  Allergen Reactions  . Morphine And Related Other (See Comments)    AGITATION, STRANGE DREAMS  . Scopace [Scopolamine] Other (See Comments)    MENTAL CHANGES    History  Substance Use Topics  . Smoking status: Former Research scientist (life sciences)  . Smokeless tobacco: Never Used     Comment: quit smoking 20-30 yr. ago  . Alcohol Use: No    Family History  Problem Relation Age of Onset  . Heart attack Mother   . Parkinsonism Father   . Diabetes Brother       Review of Systems  Constitutional: Negative.   HENT: Negative.   Eyes: Negative.   Respiratory: Negative.   Cardiovascular: Negative.   Gastrointestinal: Negative.   Genitourinary: Negative.   Musculoskeletal: Positive for joint pain.  Skin: Negative.   Neurological: Negative.   Endo/Heme/Allergies: Negative.   Psychiatric/Behavioral: Negative.     Objective:  Physical Exam  Constitutional: She is oriented to person, place, and time. She appears well-developed and well-nourished.  HENT:  Head: Normocephalic and atraumatic.  Eyes: Pupils are equal, round, and reactive to light.  Neck: Normal range of motion. Neck supple.  Cardiovascular: Intact distal pulses.   Respiratory: Effort normal.  Musculoskeletal: She exhibits tenderness.  Patient walks with an antalgic, left-sided gait with or without the walker.  Her surgical scar is well-healed.  There is no sign of infection.  Internal rotation  exacerbates and reproduces her groin pain that she is complaining of.  She is neurovascularly intact distally.    Neurological: She is alert and oriented to person, place, and time.  Skin: Skin is warm and dry.  Psychiatric: She has a normal mood and affect. Her behavior is normal. Judgment and thought content normal.    Vital signs in last 24 hours:     Labs:   Estimated body mass index is 25.01 kg/(m^2) as calculated from the following:   Height as of 04/21/14: 5\' 2"  (1.575 m).   Weight as of 04/21/14: 62.052 kg (136 lb 12.8 oz).  Imaging Review:  Prior x-rays reviewed showing well-placed well fixed cemented hemiarthroplasty stem.  Superiorly and medially.  The acetabular cartilage does appear to be relatively thin.  Assessment/Plan:  End stage arthritis, left hip(s) with failed previous arthroplasty.  The patient history, physical examination, clinical judgement of the provider and imaging studies are consistent with end stage degenerative joint disease of the left hip(s), previous total hip arthroplasty. Revision total hip arthroplasty is deemed medically necessary. The treatment options  including medical management, injection therapy, arthroscopy and arthroplasty were discussed at length. The risks and benefits of total hip arthroplasty were presented and reviewed. The risks due to aseptic loosening, infection, stiffness, dislocation/subluxation,  thromboembolic complications and other imponderables were discussed.  The patient acknowledged the explanation, agreed to proceed with the plan and consent was signed. Patient is being admitted for inpatient treatment for surgery, pain control, PT, OT, prophylactic antibiotics, VTE prophylaxis, progressive ambulation and ADL's and discharge planning. The patient is planning to be discharged to skilled nursing facility

## 2014-07-07 MED ORDER — DEXTROSE-NACL 5-0.45 % IV SOLN: INTRAVENOUS | Status: DC

## 2014-07-07 MED ORDER — CEFAZOLIN SODIUM-DEXTROSE 2-3 GM-% IV SOLR
2.0000 g | INTRAVENOUS | Status: AC
  Administered 2014-07-08: 2 g via INTRAVENOUS
  Filled 2014-07-07: qty 50

## 2014-07-07 NOTE — Anesthesia Preprocedure Evaluation (Signed)
Anesthesia Evaluation  Patient identified by MRN, date of birth, ID band Patient awake    Reviewed: Allergy & Precautions, NPO status , Patient's Chart, lab work & pertinent test results, reviewed documented beta blocker date and time   Airway        Dental   Pulmonary former smoker,          Cardiovascular hypertension, Pt. on medications  EKG brady otherwise WNL 2016   Neuro/Psych Anxiety Depression    GI/Hepatic Neg liver ROS, GERD-  Medicated,  Endo/Other  negative endocrine ROS  Renal/GU negative Renal ROS     Musculoskeletal  (+) Arthritis -,   Abdominal   Peds  Hematology 13/38 H/H   Anesthesia Other Findings   Reproductive/Obstetrics                             Anesthesia Physical Anesthesia Plan  ASA: III  Anesthesia Plan: General   Post-op Pain Management:    Induction: Intravenous  Airway Management Planned: Oral ETT  Additional Equipment:   Intra-op Plan:   Post-operative Plan: Extubation in OR  Informed Consent: I have reviewed the patients History and Physical, chart, labs and discussed the procedure including the risks, benefits and alternatives for the proposed anesthesia with the patient or authorized representative who has indicated his/her understanding and acceptance.     Plan Discussed with:   Anesthesia Plan Comments:         Anesthesia Quick Evaluation

## 2014-07-08 ENCOUNTER — Inpatient Hospital Stay (HOSPITAL_COMMUNITY)
Admission: RE | Admit: 2014-07-08 | Discharge: 2014-07-11 | DRG: 467 | Disposition: A | Discharge status: Skilled Nursing Facility | Payer: Medicare Other | Source: Ambulatory Visit | Attending: Orthopedic Surgery | Admitting: Orthopedic Surgery

## 2014-07-08 ENCOUNTER — Inpatient Hospital Stay (HOSPITAL_COMMUNITY): Payer: Medicare Other | Admitting: Anesthesiology

## 2014-07-08 ENCOUNTER — Inpatient Hospital Stay (HOSPITAL_COMMUNITY): Payer: Medicare Other

## 2014-07-08 ENCOUNTER — Encounter (HOSPITAL_COMMUNITY)
Admission: RE | Disposition: A | Discharge status: Skilled Nursing Facility | Payer: Self-pay | Source: Ambulatory Visit | Attending: Orthopedic Surgery

## 2014-07-08 ENCOUNTER — Encounter (HOSPITAL_COMMUNITY): Payer: Self-pay | Admitting: *Deleted

## 2014-07-08 ENCOUNTER — Inpatient Hospital Stay (HOSPITAL_COMMUNITY): Payer: Medicare Other | Admitting: Emergency Medicine

## 2014-07-08 DIAGNOSIS — R000 Tachycardia, unspecified: Secondary | ICD-10-CM | POA: Diagnosis not present

## 2014-07-08 DIAGNOSIS — T84021A Dislocation of internal left hip prosthesis, initial encounter: Secondary | ICD-10-CM | POA: Diagnosis present

## 2014-07-08 DIAGNOSIS — I10 Essential (primary) hypertension: Secondary | ICD-10-CM | POA: Diagnosis present

## 2014-07-08 DIAGNOSIS — M21252 Flexion deformity, left hip: Secondary | ICD-10-CM | POA: Diagnosis not present

## 2014-07-08 DIAGNOSIS — Z96659 Presence of unspecified artificial knee joint: Secondary | ICD-10-CM | POA: Diagnosis not present

## 2014-07-08 DIAGNOSIS — F329 Major depressive disorder, single episode, unspecified: Secondary | ICD-10-CM | POA: Diagnosis present

## 2014-07-08 DIAGNOSIS — M6281 Muscle weakness (generalized): Secondary | ICD-10-CM | POA: Diagnosis not present

## 2014-07-08 DIAGNOSIS — Z96649 Presence of unspecified artificial hip joint: Secondary | ICD-10-CM

## 2014-07-08 DIAGNOSIS — T849XXA Unspecified complication of internal orthopedic prosthetic device, implant and graft, initial encounter: Secondary | ICD-10-CM | POA: Diagnosis not present

## 2014-07-08 DIAGNOSIS — D62 Acute posthemorrhagic anemia: Secondary | ICD-10-CM | POA: Diagnosis not present

## 2014-07-08 DIAGNOSIS — Z471 Aftercare following joint replacement surgery: Secondary | ICD-10-CM | POA: Diagnosis not present

## 2014-07-08 DIAGNOSIS — Z96642 Presence of left artificial hip joint: Secondary | ICD-10-CM

## 2014-07-08 DIAGNOSIS — B0239 Other herpes zoster eye disease: Secondary | ICD-10-CM | POA: Diagnosis not present

## 2014-07-08 DIAGNOSIS — T84091A Other mechanical complication of internal left hip prosthesis, initial encounter: Secondary | ICD-10-CM | POA: Diagnosis not present

## 2014-07-08 DIAGNOSIS — S32301D Unspecified fracture of right ilium, subsequent encounter for fracture with routine healing: Secondary | ICD-10-CM | POA: Diagnosis not present

## 2014-07-08 DIAGNOSIS — K219 Gastro-esophageal reflux disease without esophagitis: Secondary | ICD-10-CM | POA: Diagnosis present

## 2014-07-08 DIAGNOSIS — Z09 Encounter for follow-up examination after completed treatment for conditions other than malignant neoplasm: Secondary | ICD-10-CM | POA: Diagnosis not present

## 2014-07-08 DIAGNOSIS — T8484XA Pain due to internal orthopedic prosthetic devices, implants and grafts, initial encounter: Secondary | ICD-10-CM | POA: Diagnosis not present

## 2014-07-08 DIAGNOSIS — Y798 Miscellaneous orthopedic devices associated with adverse incidents, not elsewhere classified: Secondary | ICD-10-CM | POA: Diagnosis present

## 2014-07-08 DIAGNOSIS — D509 Iron deficiency anemia, unspecified: Secondary | ICD-10-CM | POA: Diagnosis not present

## 2014-07-08 DIAGNOSIS — M25552 Pain in left hip: Secondary | ICD-10-CM | POA: Diagnosis not present

## 2014-07-08 DIAGNOSIS — W19XXXA Unspecified fall, initial encounter: Secondary | ICD-10-CM | POA: Diagnosis not present

## 2014-07-08 HISTORY — PX: TOTAL HIP REVISION: SHX763

## 2014-07-08 SURGERY — TOTAL HIP REVISION
Anesthesia: General | Site: Hip | Laterality: Left

## 2014-07-08 MED ORDER — ONDANSETRON HCL 4 MG PO TABS: 4.0000 mg | ORAL_TABLET | Freq: Four times a day (QID) | ORAL | Status: DC | PRN

## 2014-07-08 MED ORDER — PHENYLEPHRINE HCL 10 MG/ML IJ SOLN
INTRAMUSCULAR | Status: DC | PRN
  Administered 2014-07-08: 40 ug via INTRAVENOUS
  Administered 2014-07-08: 80 ug via INTRAVENOUS
  Administered 2014-07-08: 120 ug via INTRAVENOUS
  Administered 2014-07-08: 80 ug via INTRAVENOUS
  Administered 2014-07-08 (×2): 40 ug via INTRAVENOUS
  Administered 2014-07-08: 80 ug via INTRAVENOUS

## 2014-07-08 MED ORDER — ROCURONIUM BROMIDE 100 MG/10ML IV SOLN
INTRAVENOUS | Status: DC | PRN
  Administered 2014-07-08: 40 mg via INTRAVENOUS
  Administered 2014-07-08: 5 mg via INTRAVENOUS

## 2014-07-08 MED ORDER — PHENOL 1.4 % MT LIQD: 1.0000 | OROMUCOSAL | Status: DC | PRN

## 2014-07-08 MED ORDER — GLYCOPYRROLATE 0.2 MG/ML IJ SOLN
INTRAMUSCULAR | Status: AC
  Filled 2014-07-08: qty 3

## 2014-07-08 MED ORDER — BISACODYL 5 MG PO TBEC: 5.0000 mg | DELAYED_RELEASE_TABLET | Freq: Every day | ORAL | Status: DC | PRN

## 2014-07-08 MED ORDER — ACETAMINOPHEN 325 MG PO TABS: 650.0000 mg | ORAL_TABLET | Freq: Four times a day (QID) | ORAL | Status: DC | PRN

## 2014-07-08 MED ORDER — LIDOCAINE HCL (CARDIAC) 20 MG/ML IV SOLN
INTRAVENOUS | Status: DC | PRN
  Administered 2014-07-08: 100 mg via INTRAVENOUS

## 2014-07-08 MED ORDER — METOCLOPRAMIDE HCL 10 MG PO TABS: 5.0000 mg | ORAL_TABLET | Freq: Three times a day (TID) | ORAL | Status: DC | PRN

## 2014-07-08 MED ORDER — CHLORHEXIDINE GLUCONATE 4 % EX LIQD
60.0000 mL | Freq: Once | CUTANEOUS | Status: DC
  Filled 2014-07-08: qty 60

## 2014-07-08 MED ORDER — LACTATED RINGERS IV SOLN
INTRAVENOUS | Status: DC
  Administered 2014-07-08: 11:00:00 via INTRAVENOUS

## 2014-07-08 MED ORDER — METHOCARBAMOL 1000 MG/10ML IJ SOLN
500.0000 mg | Freq: Four times a day (QID) | INTRAVENOUS | Status: DC | PRN
  Filled 2014-07-08: qty 5

## 2014-07-08 MED ORDER — TIZANIDINE HCL 2 MG PO CAPS: 2.0000 mg | ORAL_CAPSULE | Freq: Three times a day (TID) | ORAL | Status: DC

## 2014-07-08 MED ORDER — ONDANSETRON HCL 4 MG/2ML IJ SOLN: 4.0000 mg | Freq: Four times a day (QID) | INTRAMUSCULAR | Status: DC | PRN

## 2014-07-08 MED ORDER — EPHEDRINE SULFATE 50 MG/ML IJ SOLN
INTRAMUSCULAR | Status: DC | PRN
  Administered 2014-07-08: 5 mg via INTRAVENOUS
  Administered 2014-07-08: 15 mg via INTRAVENOUS
  Administered 2014-07-08: 5 mg via INTRAVENOUS
  Administered 2014-07-08: 10 mg via INTRAVENOUS

## 2014-07-08 MED ORDER — PROPOFOL 10 MG/ML IV BOLUS
INTRAVENOUS | Status: DC | PRN
  Administered 2014-07-08: 120 mg via INTRAVENOUS

## 2014-07-08 MED ORDER — ROCURONIUM BROMIDE 50 MG/5ML IV SOLN
INTRAVENOUS | Status: AC
  Filled 2014-07-08: qty 1

## 2014-07-08 MED ORDER — ACYCLOVIR 800 MG PO TABS
800.0000 mg | ORAL_TABLET | Freq: Every day | ORAL | Status: DC
  Administered 2014-07-08 – 2014-07-10 (×3): 800 mg via ORAL
  Filled 2014-07-08 (×5): qty 1

## 2014-07-08 MED ORDER — OXYCODONE HCL 5 MG PO TABS
5.0000 mg | ORAL_TABLET | ORAL | Status: DC | PRN
  Administered 2014-07-08 – 2014-07-10 (×8): 10 mg via ORAL
  Administered 2014-07-11 (×3): 5 mg via ORAL
  Filled 2014-07-08 (×5): qty 2
  Filled 2014-07-08: qty 1
  Filled 2014-07-08: qty 2
  Filled 2014-07-08: qty 1
  Filled 2014-07-08 (×4): qty 2

## 2014-07-08 MED ORDER — KCL IN DEXTROSE-NACL 20-5-0.45 MEQ/L-%-% IV SOLN
INTRAVENOUS | Status: DC
  Administered 2014-07-08: 21:00:00 via INTRAVENOUS
  Filled 2014-07-08 (×11): qty 1000

## 2014-07-08 MED ORDER — PANTOPRAZOLE SODIUM 40 MG PO TBEC
80.0000 mg | DELAYED_RELEASE_TABLET | Freq: Every day | ORAL | Status: DC
  Administered 2014-07-09 – 2014-07-11 (×3): 80 mg via ORAL
  Filled 2014-07-08 (×3): qty 2

## 2014-07-08 MED ORDER — SENNOSIDES-DOCUSATE SODIUM 8.6-50 MG PO TABS: 1.0000 | ORAL_TABLET | Freq: Every evening | ORAL | Status: DC | PRN

## 2014-07-08 MED ORDER — LIDOCAINE HCL (CARDIAC) 20 MG/ML IV SOLN
INTRAVENOUS | Status: AC
  Filled 2014-07-08: qty 5

## 2014-07-08 MED ORDER — ALPRAZOLAM 0.25 MG PO TABS
0.2500 mg | ORAL_TABLET | Freq: Every evening | ORAL | Status: DC | PRN
  Administered 2014-07-11: 0.25 mg via ORAL
  Filled 2014-07-08: qty 1

## 2014-07-08 MED ORDER — ASPIRIN EC 325 MG PO TBEC: 325.0000 mg | DELAYED_RELEASE_TABLET | Freq: Two times a day (BID) | ORAL | Status: DC

## 2014-07-08 MED ORDER — BUPIVACAINE-EPINEPHRINE 0.5% -1:200000 IJ SOLN
INTRAMUSCULAR | Status: DC | PRN
  Administered 2014-07-08: 10 mL

## 2014-07-08 MED ORDER — FERROUS SULFATE 325 (65 FE) MG PO TABS: 325.0000 mg | ORAL_TABLET | Freq: Every day | ORAL | Status: DC

## 2014-07-08 MED ORDER — TRANEXAMIC ACID 100 MG/ML IV SOLN
1000.0000 mg | INTRAVENOUS | Status: AC
  Administered 2014-07-08: 1000 mg via INTRAVENOUS
  Filled 2014-07-08: qty 10

## 2014-07-08 MED ORDER — OXYCODONE-ACETAMINOPHEN 5-325 MG PO TABS: 1.0000 | ORAL_TABLET | ORAL | Status: DC | PRN

## 2014-07-08 MED ORDER — ONDANSETRON HCL 4 MG/2ML IJ SOLN
INTRAMUSCULAR | Status: DC | PRN
  Administered 2014-07-08: 4 mg via INTRAVENOUS

## 2014-07-08 MED ORDER — NEOSTIGMINE METHYLSULFATE 10 MG/10ML IV SOLN
INTRAVENOUS | Status: DC | PRN
  Administered 2014-07-08: 3 mg via INTRAVENOUS

## 2014-07-08 MED ORDER — METHOCARBAMOL 500 MG PO TABS
500.0000 mg | ORAL_TABLET | Freq: Four times a day (QID) | ORAL | Status: DC | PRN
  Administered 2014-07-08: 500 mg via ORAL
  Filled 2014-07-08 (×2): qty 1

## 2014-07-08 MED ORDER — ONDANSETRON HCL 4 MG/2ML IJ SOLN
INTRAMUSCULAR | Status: AC
  Filled 2014-07-08: qty 2

## 2014-07-08 MED ORDER — METOPROLOL TARTRATE 25 MG PO TABS
25.0000 mg | ORAL_TABLET | Freq: Every day | ORAL | Status: DC
  Administered 2014-07-08: 25 mg via ORAL
  Filled 2014-07-08 (×4): qty 1

## 2014-07-08 MED ORDER — ASPIRIN EC 325 MG PO TBEC
325.0000 mg | DELAYED_RELEASE_TABLET | Freq: Every day | ORAL | Status: DC
  Administered 2014-07-09 – 2014-07-11 (×3): 325 mg via ORAL
  Filled 2014-07-08 (×4): qty 1

## 2014-07-08 MED ORDER — MENTHOL 3 MG MT LOZG
1.0000 | LOZENGE | OROMUCOSAL | Status: DC | PRN
  Administered 2014-07-08: 3 mg via ORAL
  Filled 2014-07-08: qty 9

## 2014-07-08 MED ORDER — GLYCOPYRROLATE 0.2 MG/ML IJ SOLN
INTRAMUSCULAR | Status: DC | PRN
  Administered 2014-07-08: 0.4 mg via INTRAVENOUS

## 2014-07-08 MED ORDER — METOPROLOL TARTRATE 25 MG PO TABS
50.0000 mg | ORAL_TABLET | Freq: Every day | ORAL | Status: DC
  Administered 2014-07-09 – 2014-07-11 (×2): 50 mg via ORAL
  Filled 2014-07-08 (×3): qty 2

## 2014-07-08 MED ORDER — MAGNESIUM CITRATE PO SOLN: 1.0000 | Freq: Once | ORAL | Status: AC | PRN

## 2014-07-08 MED ORDER — FENTANYL CITRATE 0.05 MG/ML IJ SOLN
INTRAMUSCULAR | Status: DC | PRN
  Administered 2014-07-08: 50 ug via INTRAVENOUS
  Administered 2014-07-08: 150 ug via INTRAVENOUS
  Administered 2014-07-08: 50 ug via INTRAVENOUS

## 2014-07-08 MED ORDER — SODIUM CHLORIDE 0.9 % IR SOLN
Status: DC | PRN
  Administered 2014-07-08: 1000 mL

## 2014-07-08 MED ORDER — HYDROMORPHONE HCL 1 MG/ML IJ SOLN
1.0000 mg | INTRAMUSCULAR | Status: DC | PRN
  Administered 2014-07-08: 1 mg via INTRAVENOUS
  Filled 2014-07-08 (×2): qty 1

## 2014-07-08 MED ORDER — VENLAFAXINE HCL ER 150 MG PO CP24
150.0000 mg | ORAL_CAPSULE | Freq: Every day | ORAL | Status: DC
  Administered 2014-07-08 – 2014-07-10 (×3): 150 mg via ORAL
  Filled 2014-07-08 (×4): qty 1

## 2014-07-08 MED ORDER — METOCLOPRAMIDE HCL 5 MG/ML IJ SOLN: 5.0000 mg | Freq: Three times a day (TID) | INTRAMUSCULAR | Status: DC | PRN

## 2014-07-08 MED ORDER — DOCUSATE SODIUM 100 MG PO CAPS
100.0000 mg | ORAL_CAPSULE | Freq: Two times a day (BID) | ORAL | Status: DC
  Administered 2014-07-08 – 2014-07-11 (×6): 100 mg via ORAL
  Filled 2014-07-08 (×6): qty 1

## 2014-07-08 MED ORDER — LACTATED RINGERS IV SOLN
INTRAVENOUS | Status: DC | PRN
  Administered 2014-07-08 (×2): via INTRAVENOUS

## 2014-07-08 MED ORDER — FENTANYL CITRATE 0.05 MG/ML IJ SOLN
INTRAMUSCULAR | Status: AC
  Filled 2014-07-08: qty 5

## 2014-07-08 MED ORDER — PROPOFOL 10 MG/ML IV BOLUS
INTRAVENOUS | Status: AC
  Filled 2014-07-08: qty 20

## 2014-07-08 MED ORDER — BUPIVACAINE-EPINEPHRINE (PF) 0.5% -1:200000 IJ SOLN
INTRAMUSCULAR | Status: AC
  Filled 2014-07-08: qty 30

## 2014-07-08 MED ORDER — DIPHENHYDRAMINE HCL 12.5 MG/5ML PO ELIX: 12.5000 mg | ORAL_SOLUTION | ORAL | Status: DC | PRN

## 2014-07-08 MED ORDER — ACETAMINOPHEN 650 MG RE SUPP: 650.0000 mg | Freq: Four times a day (QID) | RECTAL | Status: DC | PRN

## 2014-07-08 SURGICAL SUPPLY — 65 items
BLADE SAW SAG 73X25 THK (BLADE)
BLADE SAW SGTL 73X25 THK (BLADE) IMPLANT
BRUSH FEMORAL CANAL (MISCELLANEOUS) IMPLANT
COVER SURGICAL LIGHT HANDLE (MISCELLANEOUS) ×3 IMPLANT
DRAPE IMP U-DRAPE 54X76 (DRAPES) ×3 IMPLANT
DRAPE ORTHO SPLIT 77X108 STRL (DRAPES) ×3
DRAPE PROXIMA HALF (DRAPES) ×3 IMPLANT
DRAPE SURG ORHT 6 SPLT 77X108 (DRAPES) ×1 IMPLANT
DRAPE U-SHAPE 47X51 STRL (DRAPES) ×3 IMPLANT
DRILL BIT 7/64X5 (BIT) ×3 IMPLANT
DRSG AQUACEL AG ADV 3.5X10 (GAUZE/BANDAGES/DRESSINGS) ×3 IMPLANT
DURAPREP 26ML APPLICATOR (WOUND CARE) ×3 IMPLANT
ELECT BLADE 4.0 EZ CLEAN MEGAD (MISCELLANEOUS) ×3
ELECT BLADE 6.5 EXT (BLADE) IMPLANT
ELECT REM PT RETURN 9FT ADLT (ELECTROSURGICAL) ×3
ELECTRODE BLDE 4.0 EZ CLN MEGD (MISCELLANEOUS) IMPLANT
ELECTRODE REM PT RTRN 9FT ADLT (ELECTROSURGICAL) ×1 IMPLANT
ELIMINATOR HOLE APEX DEPUY (Hips) ×2 IMPLANT
EVACUATOR 1/8 PVC DRAIN (DRAIN) IMPLANT
GAUZE SPONGE 4X4 12PLY STRL (GAUZE/BANDAGES/DRESSINGS) ×3 IMPLANT
GAUZE XEROFORM 5X9 LF (GAUZE/BANDAGES/DRESSINGS) ×3 IMPLANT
GLOVE BIO SURGEON STRL SZ7.5 (GLOVE) ×3 IMPLANT
GLOVE BIO SURGEON STRL SZ8.5 (GLOVE) ×6 IMPLANT
GLOVE BIOGEL PI IND STRL 6.5 (GLOVE) IMPLANT
GLOVE BIOGEL PI IND STRL 8 (GLOVE) ×2 IMPLANT
GLOVE BIOGEL PI IND STRL 9 (GLOVE) ×1 IMPLANT
GLOVE BIOGEL PI INDICATOR 6.5 (GLOVE) ×6
GLOVE BIOGEL PI INDICATOR 8 (GLOVE) ×4
GLOVE BIOGEL PI INDICATOR 9 (GLOVE) ×2
GLOVE SURG SS PI 6.0 STRL IVOR (GLOVE) ×2 IMPLANT
GOWN STRL REUS W/ TWL LRG LVL3 (GOWN DISPOSABLE) ×1 IMPLANT
GOWN STRL REUS W/ TWL XL LVL3 (GOWN DISPOSABLE) ×2 IMPLANT
GOWN STRL REUS W/TWL LRG LVL3 (GOWN DISPOSABLE) ×3
GOWN STRL REUS W/TWL XL LVL3 (GOWN DISPOSABLE) ×6
HANDPIECE INTERPULSE COAX TIP (DISPOSABLE)
HEAD FEM STD 32X+1 STRL (Hips) ×2 IMPLANT
HOOD PEEL AWAY FACE SHEILD DIS (HOOD) ×8 IMPLANT
KIT BASIN OR (CUSTOM PROCEDURE TRAY) ×3 IMPLANT
KIT ROOM TURNOVER OR (KITS) ×3 IMPLANT
LINER MARATHON 10 DEG 32MMX450 (Hips) ×3 IMPLANT
LINER MARATHON 10D 32MMX450 (Hips) IMPLANT
MANIFOLD NEPTUNE II (INSTRUMENTS) ×3 IMPLANT
NEEDLE 22X1 1/2 (OR ONLY) (NEEDLE) ×3 IMPLANT
NS IRRIG 1000ML POUR BTL (IV SOLUTION) ×6 IMPLANT
PACK TOTAL JOINT (CUSTOM PROCEDURE TRAY) ×3 IMPLANT
PACK UNIVERSAL I (CUSTOM PROCEDURE TRAY) ×3 IMPLANT
PAD ARMBOARD 7.5X6 YLW CONV (MISCELLANEOUS) ×6 IMPLANT
PASSER SUT SWANSON 36MM LOOP (INSTRUMENTS) IMPLANT
PIN SECT CUP 50MM (Hips) ×2 IMPLANT
SET HNDPC FAN SPRY TIP SCT (DISPOSABLE) IMPLANT
SPONGE LAP 18X18 X RAY DECT (DISPOSABLE) IMPLANT
STAPLER VISISTAT 35W (STAPLE) ×3 IMPLANT
SUT ETHIBOND 2 V 37 (SUTURE) ×3 IMPLANT
SUT VIC AB 0 CTB1 27 (SUTURE) ×3 IMPLANT
SUT VIC AB 1 CTX 36 (SUTURE) ×3
SUT VIC AB 1 CTX36XBRD ANBCTR (SUTURE) ×1 IMPLANT
SUT VIC AB 2-0 CTB1 (SUTURE) ×3 IMPLANT
SUT VIC AB 3-0 CT1 27 (SUTURE) ×3
SUT VIC AB 3-0 CT1 TAPERPNT 27 (SUTURE) ×1 IMPLANT
SYR 20ML ECCENTRIC (SYRINGE) ×3 IMPLANT
SYR CONTROL 10ML LL (SYRINGE) ×3 IMPLANT
TOWEL OR 17X24 6PK STRL BLUE (TOWEL DISPOSABLE) ×3 IMPLANT
TOWEL OR 17X26 10 PK STRL BLUE (TOWEL DISPOSABLE) ×3 IMPLANT
TOWER CARTRIDGE SMART MIX (DISPOSABLE) IMPLANT
WATER STERILE IRR 1000ML POUR (IV SOLUTION) ×9 IMPLANT

## 2014-07-08 NOTE — Discharge Instructions (Signed)
Total Hip Replacement, Care After °Refer to this sheet in the next few weeks. These instructions provide you with information on caring for yourself after your procedure. Your health care provider may also give you specific instructions. Your treatment has been planned according to the most current medical practices, but problems sometimes occur. Call your health care provider if you have any problems or questions after your procedure. °HOME CARE INSTRUCTIONS  °Your health care provider will give you specific precautions for certain types of movement. Additional instructions include: °· Take medicines only as directed by your health care provider. °· Take quick showers (3-5 min) rather than bathe until your health care provider tells you that you can take baths again. °· Avoid lifting until your health care provider instructs you otherwise. °· Use a raised toilet seat and avoid sitting in low chairs as instructed by your health care provider. °· Use crutches or a walker as instructed by your health care provider. °SEEK MEDICAL CARE IF: °· You have difficulty breathing. °· You have drainage, redness, or swelling at your incision site. °· You have a bad smell coming from your incision site. °· You have persistent bleeding from your incision site. °· Your incision breaks open after sutures (stitches) or staples have been removed. °· You have a fever. °SEEK IMMEDIATE MEDICAL CARE IF:  °· You have a rash. °· You have pain or swelling in your calf or thigh. °· You have shortness of breath or chest pain. °MAKE SURE YOU: °· Understand these instructions. °· Will watch your condition. °· Will get help if you are not doing well or get worse. °Document Released: 12/28/2004 Document Revised: 10/25/2013 Document Reviewed: 08/11/2013 °ExitCare® Patient Information ©2015 [employer]. This information is not intended to replace advice given to you by your health care provider. Make sure you discuss any questions you have with  your health care provider. ° °

## 2014-07-08 NOTE — Interval H&P Note (Signed)
History and Physical Interval Note:  07/08/2014 12:39 PM  Kristin Solomon  has presented today for surgery, with the diagnosis of PAINFUL LEFT HEMI-HIP  The various methods of treatment have been discussed with the patient and family. After consideration of risks, benefits and other options for treatment, the patient has consented to  Procedure(s): TOTAL HIP REVISION (Left) as a surgical intervention .  The patient's history has been reviewed, patient examined, no change in status, stable for surgery.  I have reviewed the patient's chart and labs.  Questions were answered to the patient's satisfaction.     Kerin Salen

## 2014-07-08 NOTE — Op Note (Signed)
PATIENT ID:      Kristin Solomon  MRN:     [id number] DOB/AGE:    [birth year] / 81 y.o.       OPERATIVE REPORT    DATE OF PROCEDURE:  07/08/2014       PREOPERATIVE DIAGNOSIS:  PAINFUL LEFT HEMI-HIP                                                       Estimated body mass index is 25 kg/(m^2) as calculated from the following:   Height as of this encounter: 5' 2.5" (1.588 m).   Weight as of this encounter: 63.05 kg (139 lb).     POSTOPERATIVE DIAGNOSIS:  PAINFUL LEFT HEMI-HIP                                                           PROCEDURE:  Revision left total hip arthroplasty using a 50 mm DePuy Pinnacle Cup, [employer], 10-degree polyethylene liner index superior.  and posterior, a femoral component +0 36 mm metal head,  SURGEON: Karinda Cabriales J    ASSISTANT:   Eric K. Barton Dubois  (present throughout entire procedure and necessary for timely completion of the procedure)  ANESTHESIA:  General  BLOOD LOSS: 400 mL FLUID REPLACEMENT: 1500 mL crystalloid DRAINS:  In and out cath: 400 mL urine    INDICATIONS FOR PROCEDURE:Patient had left hemi-hip arthroplasty in 2014 and has progressively increasing groin pain with medial migration of the monopolar head towards the central acetabulum. Pain is progressive wakes her at night and interferes with activities. She is also had a few buckling episodes of the left lower extremity. Despite conservative measures with observation, anti-inflammatory medicine, narcotics, use of a cane, has severe unremitting pain and can ambulate only to blocks before resting.  Patient desires elective revision left total hip arthroplasty to decrease pain and increase function. The risks, benefits, and alternatives were discussed at length including but not limited to the risks of infection, bleeding, nerve injury, stiffness, blood clots, the need for revision surgery, cardiopulmonary complications, among others, and they were willing to proceed.Benefits  have been discussed. Questions answered.     PROCEDURE IN DETAIL: The patient was identified by armband,  received preoperative IV antibiotics in the holding area at [facility], taken to the operating room , appropriate anesthetic monitors  were attached and general endotracheal anesthesia induced. In and out catheter was accomplished Patient was rolled into the right lateral decubitus position and fixed there with a Stulberg Mark II pelvic clamp and the left lower extremity was then prepped and draped  in the usual sterile fashion from the ankle to the hemipelvis. A time-out  procedure was performed. The skin along the lateral hip and thigh  infiltrated with 10 mL of 0.5% Marcaine and epinephrine solution. We  then made a posterolateral approach to the hip. With a #10 blade, 15 cm  incision through skin and subcutaneous tissue down to the level of the  IT band. Small bleeders were identified and cauterized. IT band cut in  line with skin incision exposing the greater trochanter. A Cobra retractor was  placed between the gluteus minimus and the superior hip joint capsule, and a spiked Cobra between the quadratus femoris and the inferior hip joint capsule. The posterior pseudocapsule was then peeled off the intertrochanteric crest tissue was stout and relatively useful. These were tagged with a #2 Ethibond  Suture. We then removed superior and inferior pseudocapsule allowing [place] to dislocate the hip. The monopolar head appeared to be in good condition as did the trunnion and proximal stem. More pseudocapsule was removed anteriorly and superiorly allowing [place] to talk to trunnion into this position and [place] forward with a Hohmann retractor on the anterior column. This exposed the acetabulum and fibrous tissue was removed medially. A posterior inferior wing retractor was hammered into place at the junction of the issue him in the acetabulum. A spiked Cobra was placed in the cotyloid notch and  bleeders were identified and cauterized. We then reamed up to 49 mm obtaining good coverage in all quadrants just down to bleeding bone. The acetabulum was then irrigated out normal saline solution and a 50 mm DePuy Pinnacle shell was hammered into place in 45 of abduction and 20 of anteversion. We then placed a trial 10 liner index straight superior and performed a trial reduction with a +0 32 mm trial head. At 90 of flexion the hip was stable to 75 of internal rotation and in extension with external rotation hip could not be dislocated anteriorly. The needed flexed 220 with the limb in full extension. At this point the trial liner was removed.    A titanium [facility] was then screwed into place  followed by a 10-degree polyethylene liner index superior. At this point, a +0 36-mm metal head was  hammered on the stem. The hip was reduced. We checked our stability  one more time and found to be excellent. The wound was once again  thoroughly irrigated out with normal saline solution pulse lavage. The  capsular flap and short external rotators were repaired back to the  intertrochanteric crest through drill holes with a #2 Ethibond suture.  The IT band was closed with running 1 Vicryl suture. The subcutaneous  tissue with 0 and 2-0 undyed Vicryl suture and the skin with running  interlocking 3-0 nylon suture. Dressing of Xeroform and Mepilex was  then applied. The patient was then unclamped, rolled supine, awaken extubated and taken to recovery room without difficulty in stable condition.   Frederik Pear J 07/08/2014, 3:08 PM

## 2014-07-08 NOTE — Progress Notes (Signed)
Utilization review completed.  

## 2014-07-08 NOTE — Anesthesia Procedure Notes (Signed)
Procedure Name: Intubation Date/Time: 07/08/2014 1:55 PM Performed by: Shirlyn Goltz Pre-anesthesia Checklist: Patient identified, Emergency Drugs available, Suction available and Patient being monitored Patient Re-evaluated:Patient Re-evaluated prior to inductionOxygen Delivery Method: Circle system utilized Preoxygenation: Pre-oxygenation with 100% oxygen Intubation Type: IV induction Ventilation: Mask ventilation without difficulty Laryngoscope Size: Mac and 3 (crna dlx1 with no view of epiglottis; DrManny Dlx1 with grade 4 view) Grade View: Grade IV Tube type: Oral Tube size: 7.0 mm Number of attempts: 2 Airway Equipment and Method: Stylet Placement Confirmation: ETT inserted through vocal cords under direct vision,  positive ETCO2 and breath sounds checked- equal and bilateral Secured at: 22 cm Tube secured with: Tape Dental Injury: Teeth and Oropharynx as per pre-operative assessment  Difficulty Due To: Difficulty was anticipated, Difficult Airway- due to anterior larynx and Difficult Airway- due to limited oral opening

## 2014-07-08 NOTE — Transfer of Care (Signed)
Immediate Anesthesia Transfer of Care Note  Patient: Kristin Solomon  Procedure(s) Performed: Procedure(s): TOTAL HIP REVISION (Left)  Patient Location: PACU  Anesthesia Type:General  Level of Consciousness: awake, alert , oriented and patient cooperative  Airway & Oxygen Therapy: Patient Spontanous Breathing and Patient connected to nasal cannula oxygen  Post-op Assessment: Report given to PACU RN, Post -op Vital signs reviewed and stable, Patient moving all extremities and Patient moving all extremities X 4  Post vital signs: Reviewed and stable  Complications: No apparent anesthesia complications

## 2014-07-09 LAB — BASIC METABOLIC PANEL
Anion gap: 7 (ref 5–15)
BUN: 8 mg/dL (ref 6–23)
CALCIUM: 8.2 mg/dL — AB (ref 8.4–10.5)
CHLORIDE: 103 meq/L (ref 96–112)
CO2: 25 mmol/L (ref 19–32)
Creatinine, Ser: 0.64 mg/dL (ref 0.50–1.10)
GFR calc non Af Amer: 81 mL/min — ABNORMAL LOW (ref >90)
Glucose, Bld: 187 mg/dL — ABNORMAL HIGH (ref 70–99)
Potassium: 4.8 mmol/L (ref 3.5–5.1)
Sodium: 135 mmol/L (ref 135–145)

## 2014-07-09 LAB — CBC
HCT: 29.4 % — ABNORMAL LOW (ref 36.0–46.0)
Hemoglobin: 10 g/dL — ABNORMAL LOW (ref 12.0–15.0)
MCH: 33.8 pg (ref 26.0–34.0)
MCHC: 34 g/dL (ref 30.0–36.0)
MCV: 99.3 fL (ref 78.0–100.0)
Platelets: 221 10*3/uL (ref 150–400)
RBC: 2.96 MIL/uL — AB (ref 3.87–5.11)
RDW: 12.5 % (ref 11.5–15.5)
WBC: 8.6 10*3/uL (ref 4.0–10.5)

## 2014-07-09 NOTE — Progress Notes (Signed)
OT Cancellation Note  Patient Details Name: Kristin Solomon MRN: [id number] DOB: 1929/12/31   Cancelled Treatment:    Reason Eval/Treat Not Completed: Other (comment) Pt is Medicare and current D/C plan is SNF. No apparent immediate acute care OT needs, therefore will defer OT to SNF. If OT eval is needed please call Acute Rehab Dept. at [phone] or text page OT at [phone].    Villa Herb M 07/09/2014, 10:59 AM

## 2014-07-09 NOTE — Clinical Social Work Note (Signed)
CSW contacted [facility] and discussed higher acuity of care (SNF) with possible 1/17 d/c. CSW pending follow-up from facility. CSW to contact patient's family to complete assessment as no one was present at bedside.   [place], [place] Weekend Clinical Social Worker [phone]

## 2014-07-09 NOTE — Clinical Social Work Note (Signed)
CSW spoke with Romeo Apple [phone]) who states facility can take patient on Monday in order for Medicare part A to cover SNF benefits. CSW to follow tomorrow.  [place], [place] Weekend Clinical Social Worker [phone]

## 2014-07-09 NOTE — Progress Notes (Signed)
    Patient doing well, minimal PO L hip pain, doing well, has been up to bathroom without difficulty. Denies N/V/D/SOB/Ch Px.   Physical Exam: BP 120/55 mmHg  Pulse 92  Temp(Src) 97.7 F (36.5 C) (Oral)  Resp 18  Ht 5' 2.5" (1.588 m)  Wt 63.05 kg (139 lb)  BMI 25.00 kg/m2  SpO2 97%  Dressing in place, no drainage, distal compartments soft, SCD's in place, NVI 2+ DPP  POD #1 s/p Left Revision total hip arthroplasty per Dr Mayer Camel  - up with PT/OT, encourage ambulation, WBAT, hip precautions  - Pain well controlled, scripts printed and signed in chart - ASA 325 BID for prophylaxis  - likely d/c to assisted living facility pending PT progress, pt likely able to dc tomorrow however states facility not expecting her until monday

## 2014-07-09 NOTE — Clinical Social Work Note (Signed)
CSW spoke with RN on-call Marliss Coots, who states facility would prefer to accept patient on Monday, 1/18. Belinda inquired about patient's medical status. CSW transferred [facility] to patient's RN, Caryl Asp. CSW to continue to follow.  [place], [place] Weekend Clinical Social Worker [phone]

## 2014-07-09 NOTE — Evaluation (Signed)
Physical Therapy Evaluation Patient Details Name: Kristin Solomon MRN: [id number] DOB: 1929/12/03 Today's Date: 07/09/2014   History of Present Illness  Pt is an 81 y.o. female s/p Lt THA- revision posterior.  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  Pt will benefit from skilled PT to increase their independence and safety with mobility to allow discharge to the venue listed below.  PTA pt independent with ADLs and living at [facility]. Pt very motivated to return to independence.      Follow Up Recommendations SNF;Supervision/Assistance - 24 hour    Equipment Recommendations  None recommended by PT    Recommendations for Other Services       Precautions / Restrictions Precautions Precautions: Fall;Posterior Hip Precaution Comments: educated on posterior hip precautions  Restrictions Weight Bearing Restrictions: Yes LLE Weight Bearing: Weight bearing as tolerated      Mobility  Bed Mobility Overal bed mobility: Needs Assistance Bed Mobility: Supine to Sit;Sit to Supine     Supine to sit: Min assist;HOB elevated Sit to supine: Min assist   General bed mobility comments: handheld (A) to bring trunk to sitting position; (A) to advance Lt LE into supine position; pt limited to sitting EOB ~2 min; limited by nausea   Transfers                 General transfer comment: unable to assess; returned to supine due to nausea   Ambulation/Gait             General Gait Details: unable to assess this session   Stairs            Wheelchair Mobility    Modified Rankin (Stroke Patients Only)       Balance Overall balance assessment: Needs assistance;History of Falls Sitting-balance support: Feet supported;Single extremity supported Sitting balance-Leahy Scale: Poor Sitting balance - Comments: UE support due to nausea        Standing  balance comment: unable to assess                              Pertinent Vitals/Pain Pain Assessment: Faces Faces Pain Scale: Hurts little more Pain Location: Lt hip Pain Descriptors / Indicators: Grimacing Pain Intervention(s): Monitored during session;Premedicated before session;Repositioned    Home Living Family/patient expects to be discharged to:: Skilled nursing facility Living Arrangements: Alone               Additional Comments: from independent living     Prior Function Level of Independence: Independent with assistive device(s)         Comments: ambulates with rollator      Hand Dominance        Extremity/Trunk Assessment   Upper Extremity Assessment: Defer to OT evaluation           Lower Extremity Assessment: LLE deficits/detail   LLE Deficits / Details: hip grossly 3/5  Cervical / Trunk Assessment: Normal  Communication   Communication: No difficulties  Cognition Arousal/Alertness: Awake/alert Behavior During Therapy: WFL for tasks assessed/performed Overall Cognitive Status: Within Functional Limits for tasks assessed                      General Comments General comments (skin integrity, edema, etc.): encouraged OOB with nursing later today, RN aware     Exercises Total Joint  Exercises Ankle Circles/Pumps: AROM;Both;Supine;15 reps Gluteal Sets: AROM;Both;10 reps Heel Slides: AAROM;Left;10 reps;Supine      Assessment/Plan    PT Assessment Patient needs continued PT services  PT Diagnosis Difficulty walking;Generalized weakness;Acute pain   PT Problem List Decreased strength;Decreased range of motion;Decreased activity tolerance;Decreased mobility;Decreased balance;Decreased knowledge of use of DME;Decreased safety awareness;Decreased knowledge of precautions;Pain  PT Treatment Interventions DME instruction;Gait training;Functional mobility training;Therapeutic activities;Therapeutic exercise;Balance  training;Neuromuscular re-education;Patient/family education   PT Goals (Current goals can be found in the Care Plan section) Acute Rehab PT Goals Patient Stated Goal: to go back to rehab by monday PT Goal Formulation: With patient Time For Goal Achievement: 07/16/14 Potential to Achieve Goals: Good    Frequency 7X/week   Barriers to discharge Decreased caregiver support from independent living     Co-evaluation               End of Session   Activity Tolerance: Patient limited by fatigue;Other (comment) (nausea) Patient left: in bed;with call bell/phone within reach;with nursing/sitter in room Nurse Communication: Mobility status;Precautions;Weight bearing status         Time: 2426-8341 PT Time Calculation (min) (ACUTE ONLY): 25 min   Charges:   PT Evaluation $Initial PT Evaluation Tier I: 1 Procedure PT Treatments $Therapeutic Activity: 8-22 mins   PT G CodesGustavus Bryant, [place]  [phone] 07/09/2014, 8:40 AM

## 2014-07-10 LAB — CBC
HCT: 27.3 % — ABNORMAL LOW (ref 36.0–46.0)
Hemoglobin: 9 g/dL — ABNORMAL LOW (ref 12.0–15.0)
MCH: 32.4 pg (ref 26.0–34.0)
MCHC: 33 g/dL (ref 30.0–36.0)
MCV: 98.2 fL (ref 78.0–100.0)
Platelets: 213 10*3/uL (ref 150–400)
RBC: 2.78 MIL/uL — ABNORMAL LOW (ref 3.87–5.11)
RDW: 12.7 % (ref 11.5–15.5)
WBC: 8.4 10*3/uL (ref 4.0–10.5)

## 2014-07-10 NOTE — Clinical Social Work Psychosocial (Signed)
Clinical Social Work Department BRIEF PSYCHOSOCIAL ASSESSMENT 07/10/2014  Patient:  Kristin Solomon, Kristin Solomon     Account Number:  [id number]     Admit date:  07/08/2014  Clinical Social Worker:  Hubert Azure  Date/Time:  07/10/2014 05:35 PM  Referred by:  Physician  Date Referred:  07/10/2014 Referred for  SNF Placement  Other - See comment   Other Referral:   Patient from [place].   Interview type:  Patient Other interview type:    PSYCHOSOCIAL DATA Living Status:  FACILITY Admitted from facility:  [facility] Level of care:  Independent Living Primary support name:  Geryl Dohn ([phone]) Primary support relationship to patient:  CHILD, ADULT Degree of support available:   Good    CURRENT CONCERNS Current Concerns  Post-Acute Placement   Other Concerns:    SOCIAL WORK ASSESSMENT / PLAN CSW met with patient who was alert and oriented. Patient was sitting in chair at bedside. CSW introduced self and explained role. CSW explained SNF placement process and discussed d/c plan with patient. Per patient, she has been a resident of [facility] for 6 or 7 years. Patient states she uses a cane and walker to get around. Per patient, she had hip surgery 1 year ago and now she's having a "redo." Patient is agreeable to SNF placement at [facility].   Assessment/plan status:  Other - See comment Other assessment/ plan:   CSW to submit PASARR and complete FL2 for placement.   Information/referral to community resources:    PATIENT'S/FAMILY'S RESPONSE TO PLAN OF CARE: Patient was pleasant and agreeable to SNF at [facility].   [place], [place] Weekend Clinical Social Worker [phone]

## 2014-07-10 NOTE — Progress Notes (Signed)
    Patient doing well, denies PO L hip pain, doing well, has been up to bathroom without difficulty and making excellent gains in PT. Denies N/V/D/SOB/Ch Px.   Physical Exam: BP 116/40 mmHg  Pulse 92  Temp(Src) 99 F (37.2 C) (Oral)  Resp 16  Ht 5' 2.5" (1.588 m)  Wt 63.05 kg (139 lb)  BMI 25.00 kg/m2  SpO2 94%  Pt appears very comfortable sitting up in chair, Dressing in place, no drainage, distal compartments soft, SCD's in place, NVI 2+ DPP  POD #2 s/p Left Revision total hip arthroplasty per Dr Mayer Camel  - up with PT/OT, encourage ambulation, WBAT, hip precautions  - Pain well controlled, scripts printed and signed in chart - ASA 325 BID for prophylaxis  - likely d/c today vs tomorrow to assisted living facility pending PT progress, pt states facility not expecting her until Monday and unable to go until then.Marland Kitchen

## 2014-07-10 NOTE — Progress Notes (Signed)
Physical Therapy Treatment Patient Details Name: Kristin Solomon MRN: [id number] DOB: December 25, 1929 Today's Date: 07/10/2014    History of Present Illness Pt is an 81 y.o. female s/p Lt THA- revision posterior.    PT Comments    Pt progressing with mobility/PT goals.  Currently at min guard for sit<>stand transfers & ambulation.  Cont with current POC & d/c recommendation of SNF to to maximize functional mobiltiy prior to returning back to home.     Follow Up Recommendations  SNF;Supervision/Assistance - 24 hour     Equipment Recommendations  None recommended by PT    Recommendations for Other Services       Precautions / Restrictions Precautions Precautions: Fall;Posterior Hip Precaution Comments: reviewed hip precautions and not exiting bed without (A)  Restrictions Weight Bearing Restrictions: Yes LLE Weight Bearing: Weight bearing as tolerated    Mobility  Bed Mobility               General bed mobility comments: Pt sitting in recliner   Transfers Overall transfer level: Needs assistance Equipment used: Rolling walker (2 wheeled) Transfers: Sit to/from Stand Sit to Stand: Min guard         General transfer comment: Cues for hand placement & technique.  Guarding for safety  Ambulation/Gait Ambulation/Gait assistance: Min guard Ambulation Distance (Feet): 100 Feet (50' x 2) Assistive device: Rolling walker (2 wheeled) Gait Pattern/deviations: Step-through pattern;Decreased stride length;Decreased weight shift to left Gait velocity: decr   General Gait Details: encouraged increased LLE WBing to increase RLE step length.  Guarding for safety.  Seated rest break after ~50'.    Stairs            Wheelchair Mobility    Modified Rankin (Stroke Patients Only)       Balance                                    Cognition Arousal/Alertness: Awake/alert Behavior During Therapy: WFL for tasks assessed/performed Overall Cognitive  Status: Within Functional Limits for tasks assessed                      Exercises Total Joint Exercises Ankle Circles/Pumps: AROM;Both;10 reps Gluteal Sets: AROM;Both;10 reps Long Arc Quad: AROM;Strengthening;Left;10 reps    General Comments        Pertinent Vitals/Pain Pain Assessment: 0-10 Pain Score: 4  Pain Location: Lt hip Pain Descriptors / Indicators: Aching Pain Intervention(s): Monitored during session;Repositioned;Premedicated before session    Home Living                      Prior Function            PT Goals (current goals can now be found in the care plan section) Acute Rehab PT Goals PT Goal Formulation: With patient Time For Goal Achievement: 07/16/14 Potential to Achieve Goals: Good Progress towards PT goals: Progressing toward goals    Frequency  7X/week    PT Plan Current plan remains appropriate    Co-evaluation             End of Session   Activity Tolerance: Patient tolerated treatment well Patient left: in chair;with call bell/phone within reach;with family/visitor present     Time: 7616-0737 PT Time Calculation (min) (ACUTE ONLY): 28 min  Charges:  $Gait Training: 8-22 mins $Therapeutic Activity: 8-22 mins  G Codes:      Sena Hitch 07/10/2014, 11:39 AM   Sarajane Marek, PTA [phone] 07/10/2014

## 2014-07-11 DIAGNOSIS — M21252 Flexion deformity, left hip: Secondary | ICD-10-CM | POA: Diagnosis not present

## 2014-07-11 DIAGNOSIS — R000 Tachycardia, unspecified: Secondary | ICD-10-CM | POA: Diagnosis not present

## 2014-07-11 DIAGNOSIS — Z09 Encounter for follow-up examination after completed treatment for conditions other than malignant neoplasm: Secondary | ICD-10-CM | POA: Diagnosis not present

## 2014-07-11 DIAGNOSIS — F418 Other specified anxiety disorders: Secondary | ICD-10-CM | POA: Diagnosis not present

## 2014-07-11 DIAGNOSIS — M6281 Muscle weakness (generalized): Secondary | ICD-10-CM | POA: Diagnosis not present

## 2014-07-11 DIAGNOSIS — K5900 Constipation, unspecified: Secondary | ICD-10-CM | POA: Diagnosis not present

## 2014-07-11 DIAGNOSIS — Z96642 Presence of left artificial hip joint: Secondary | ICD-10-CM | POA: Diagnosis not present

## 2014-07-11 DIAGNOSIS — T849XXA Unspecified complication of internal orthopedic prosthetic device, implant and graft, initial encounter: Secondary | ICD-10-CM | POA: Diagnosis not present

## 2014-07-11 DIAGNOSIS — R682 Dry mouth, unspecified: Secondary | ICD-10-CM | POA: Diagnosis not present

## 2014-07-11 DIAGNOSIS — S32301D Unspecified fracture of right ilium, subsequent encounter for fracture with routine healing: Secondary | ICD-10-CM | POA: Diagnosis not present

## 2014-07-11 DIAGNOSIS — Z96659 Presence of unspecified artificial knee joint: Secondary | ICD-10-CM | POA: Diagnosis not present

## 2014-07-11 DIAGNOSIS — K219 Gastro-esophageal reflux disease without esophagitis: Secondary | ICD-10-CM | POA: Diagnosis not present

## 2014-07-11 DIAGNOSIS — M25552 Pain in left hip: Secondary | ICD-10-CM | POA: Diagnosis not present

## 2014-07-11 DIAGNOSIS — F329 Major depressive disorder, single episode, unspecified: Secondary | ICD-10-CM | POA: Diagnosis not present

## 2014-07-11 DIAGNOSIS — B0050 Herpesviral ocular disease, unspecified: Secondary | ICD-10-CM | POA: Diagnosis not present

## 2014-07-11 DIAGNOSIS — W19XXXA Unspecified fall, initial encounter: Secondary | ICD-10-CM | POA: Diagnosis not present

## 2014-07-11 DIAGNOSIS — D509 Iron deficiency anemia, unspecified: Secondary | ICD-10-CM | POA: Diagnosis not present

## 2014-07-11 DIAGNOSIS — I10 Essential (primary) hypertension: Secondary | ICD-10-CM | POA: Diagnosis not present

## 2014-07-11 DIAGNOSIS — E785 Hyperlipidemia, unspecified: Secondary | ICD-10-CM | POA: Diagnosis not present

## 2014-07-11 DIAGNOSIS — B0239 Other herpes zoster eye disease: Secondary | ICD-10-CM | POA: Diagnosis not present

## 2014-07-11 LAB — CBC
HCT: 26.1 % — ABNORMAL LOW (ref 36.0–46.0)
HEMOGLOBIN: 8.8 g/dL — AB (ref 12.0–15.0)
MCH: 33.5 pg (ref 26.0–34.0)
MCHC: 33.7 g/dL (ref 30.0–36.0)
MCV: 99.2 fL (ref 78.0–100.0)
Platelets: 207 10*3/uL (ref 150–400)
RBC: 2.63 MIL/uL — ABNORMAL LOW (ref 3.87–5.11)
RDW: 12.5 % (ref 11.5–15.5)
WBC: 7.5 10*3/uL (ref 4.0–10.5)

## 2014-07-11 NOTE — Discharge Summary (Signed)
Patient ID: Kristin Solomon MRN: [id number] DOB/AGE: 1929-12-17 81 y.o.  Admit date: 07/08/2014 Discharge date: 07/11/2014  Admission Diagnoses:  Active Problems:   S/P revision of total hip   Discharge Diagnoses:  Same  Past Medical History  Diagnosis Date  . Herpes ocular     history of  . GERD (gastroesophageal reflux disease)   . Depression   . High cholesterol   . Hypertension     under control; has been on med. x 10 yr.  . History of hiatal hernia   . Osteoarthritis of knee     bilateral  . Anemia, iron deficiency   . Dental crowns present   . Fracture of femoral neck, left 06/12/2013  . Stenosing tenosynovitis of finger 08/2012    right index finger  . Sciatic pain   . Gait disorder 04/21/2014    Surgeries: Procedure(s): TOTAL HIP REVISION on 07/08/2014   Consultants:    Discharged Condition: Improved  Hospital Course: Kristin Solomon is an 81 y.o. female who was admitted 07/08/2014 for operative treatment of painful left hemi-hip arthroplasty. Patient has severe unremitting pain that affects sleep, daily activities, and work/hobbies. After pre-op clearance the patient was taken to the operating room on 07/08/2014 and underwent  Procedure(s): Left TOTAL HIP REVISION.    Patient was given perioperative antibiotics: Anti-infectives    Start     Dose/Rate Route Frequency Ordered Stop   07/08/14 1800  acyclovir (ZOVIRAX) tablet 800 mg     800 mg Oral Daily 07/08/14 1656     07/08/14 0600  ceFAZolin (ANCEF) IVPB 2 g/50 mL premix     2 g100 mL/hr over 30 Minutes Intravenous On call to O.R. 07/07/14 1455 07/08/14 1401       Patient was given sequential compression devices, early ambulation, and chemoprophylaxis to prevent DVT.  Patient benefited maximally from hospital stay and there were no complications.    Recent vital signs: Patient Vitals for the past 24 hrs:  BP Temp Temp src Pulse Resp SpO2  07/11/14 0657 (!) 107/45 mmHg 97.6 F (36.4 C) Oral (!)  109 16 91 %  07/11/14 0000 - - - - 18 93 %  07/10/14 2224 - - - (!) 116 - -  07/10/14 2117 (!) 116/46 mmHg 98 F (36.7 C) Oral (!) 120 18 92 %  07/10/14 2000 - - - - 18 92 %  07/10/14 1600 - - - - 16 -  07/10/14 1538 (!) 107/50 mmHg - - (!) 114 - -  07/10/14 1304 (!) 143/59 mmHg 98.5 F (36.9 C) Oral (!) 116 16 92 %     Recent laboratory studies:  Recent Labs  07/09/14 0500 07/10/14 0520 07/11/14 0531  WBC 8.6 8.4 7.5  HGB 10.0* 9.0* 8.8*  HCT 29.4* 27.3* 26.1*  PLT 221 213 207  NA 135  --   --   K 4.8  --   --   CL 103  --   --   CO2 25  --   --   BUN 8  --   --   CREATININE 0.64  --   --   GLUCOSE 187*  --   --   CALCIUM 8.2*  --   --      Discharge Medications:     Medication List    TAKE these medications        acetaminophen 500 MG tablet  Commonly known as:  TYLENOL  Take 500 mg by mouth every 6 (  six) hours as needed for pain.     acyclovir 400 MG tablet  Commonly known as:  ZOVIRAX  Take 800 mg by mouth daily.     ALPRAZolam 0.5 MG tablet  Commonly known as:  XANAX  Take 0.25 mg by mouth at bedtime as needed for sleep.     alum & mag hydroxide-simeth 200-200-20 MG/5ML suspension  Commonly known as:  MAALOX/MYLANTA  Take 30 mLs by mouth every 4 (four) hours as needed for indigestion.     aspirin EC 81 MG tablet  Take 81 mg by mouth daily.     aspirin EC 325 MG tablet  Take 1 tablet (325 mg total) by mouth 2 (two) times daily.     cholecalciferol 1000 UNITS tablet  Commonly known as:  VITAMIN D  Take 2,000 Units by mouth daily.     DSS 100 MG Caps  Take 100 mg by mouth 2 (two) times daily.     ferrous sulfate 325 (65 FE) MG tablet  Take 325 mg by mouth daily.     menthol-cetylpyridinium 3 MG lozenge  Commonly known as:  CEPACOL  Take 1 lozenge (3 mg total) by mouth as needed for sore throat (sore throat).     metoprolol 50 MG tablet  Commonly known as:  LOPRESSOR  Take 25-50 mg by mouth 2 (two) times daily. Take 50 MG in the morning  and take 25 MG at bedtime.     omeprazole 40 MG capsule  Commonly known as:  PRILOSEC  Take 40 mg by mouth 2 (two) times daily.     oxyCODONE-acetaminophen 5-325 MG per tablet  Commonly known as:  ROXICET  Take 1 tablet by mouth every 4 (four) hours as needed.     rosuvastatin 10 MG tablet  Commonly known as:  CRESTOR  Take 10 mg by mouth daily.     sennosides-docusate sodium 8.6-50 MG tablet  Commonly known as:  SENOKOT-S  Take 2 tablets by mouth daily.     tizanidine 2 MG capsule  Commonly known as:  ZANAFLEX  Take 1 capsule (2 mg total) by mouth 3 (three) times daily.     venlafaxine XR 75 MG 24 hr capsule  Commonly known as:  EFFEXOR-XR  Take 150 mg by mouth at bedtime.        Diagnostic Studies: Dg Chest 2 View  06/28/2014   CLINICAL DATA:  Preop chest x-ray  EXAM: CHEST  2 VIEW  COMPARISON:  Chest x-ray of 06/12/2013 and 12/17/2011  FINDINGS: The lungs remain hyperaerated with increased AP diameter and flattened hemidiaphragms suggesting and element of emphysema. Linear scarring is noted at the left lung base. No focal infiltrate or effusion is seen. Mild cardiomegaly is stable. The bones are osteopenic. Compared to the lateral chest x-ray from 2013, there is a compressed mid lower thoracic vertebral body in the region of T8 which has occurred since the prior lateral chest x-ray.  IMPRESSION: 1. No active lung disease.  Question emphysema. 2. Partially compressed mid lower thoracic vertebral body since the lateral chest x-ray of June 2013.   Electronically Signed   By: Ivar Drape M.D.   On: 06/28/2014 10:35   Dg Pelvis Portable  07/08/2014   CLINICAL DATA:  Revision of the left hip replacement.  EXAM: PORTABLE PELVIS 1-2 VIEWS  COMPARISON:  05/25/2014  FINDINGS: Patient has a left hip arthroplasty. Lucency in the soft tissues are consistent with recent surgery. Negative for a periprosthetic fracture. Pelvic bony ring  is intact. Multiple phleboliths throughout the pelvis.   IMPRESSION: Left hip arthroplasty.  No complicating features.   Electronically Signed   By: Markus Daft M.D.   On: 07/08/2014 16:28    Disposition: 03-Skilled Nursing Facility        Follow-up Information    Follow up with Kerin Salen, MD In 2 weeks.   Specialty:  Orthopedic Surgery   Contact information:   [place] [address] [phone]        Signed: Kerin Salen 07/11/2014, 12:42 PM

## 2014-07-11 NOTE — Progress Notes (Signed)
Report gave to RN, Orland Mustard at [facility]. Pt is ready to discharge.

## 2014-07-11 NOTE — Progress Notes (Signed)
Physical Therapy Treatment Patient Details Name: Kristin Solomon MRN: [id number] DOB: 1929/08/03 Today's Date: 07/11/2014    History of Present Illness Pt is an 81 y.o. female s/p Lt THA- revision posterior.    PT Comments    Pt with sleepiness t/o session requiring constant v/c's to stay on task/awake. Pt aware and reports "clearly I've been sedated for a while." Pt progressing well from mobility stand point and con't to be appropriate for SNF upon d/c to achieve safe mod I level of function [facility]   Follow Up Recommendations  SNF;Supervision/Assistance - 24 hour     Equipment Recommendations  None recommended by PT    Recommendations for Other Services       Precautions / Restrictions Precautions Precautions: Fall;Posterior Hip Precaution Booklet Issued: Yes (comment) Precaution Comments: pt unable to recall, pt re-educated Restrictions Weight Bearing Restrictions: Yes LLE Weight Bearing: Weight bearing as tolerated    Mobility  Bed Mobility Overal bed mobility: Needs Assistance Bed Mobility: Supine to Sit     Supine to sit: Min guard     General bed mobility comments: v/c's to adhere to precautions  Transfers Overall transfer level: Needs assistance Equipment used: Rolling walker (2 wheeled) Transfers: Sit to/from Stand Sit to Stand: Min assist         General transfer comment: v/c's for hand placement, increased time, directional v/c's due to sleepyness  Ambulation/Gait Ambulation/Gait assistance: Min guard Ambulation Distance (Feet): 120 Feet Assistive device: Rolling walker (2 wheeled) Gait Pattern/deviations: Decreased stride length;Step-through pattern (decreased step hight) Gait velocity: decr   General Gait Details: encouraged increased step length, freq directional cues due to freq stops/standing rest breaks   Stairs            Wheelchair Mobility    Modified Rankin (Stroke Patients Only)       Balance           Standing balance support: During functional activity Standing balance-Leahy Scale: Fair Standing balance comment: RW to balance during standing ther ex                    Cognition Arousal/Alertness:  (sleepy but oriented) Behavior During Therapy: WFL for tasks assessed/performed Overall Cognitive Status: Within Functional Limits for tasks assessed                      Exercises Total Joint Exercises Hip ABduction/ADduction: AROM;Left;10 reps;Standing Long Arc Quad: AROM;Strengthening;Left;10 reps Marching in Standing: AROM;Left;15 reps;Standing (stayed within precautsion) Standing Hip Extension: AROM;Left;10 reps    General Comments        Pertinent Vitals/Pain Pain Assessment: 0-10 Pain Score: 2  Pain Location: lt hip Pain Descriptors / Indicators: Aching Pain Intervention(s): Monitored during session    Home Living                      Prior Function            PT Goals (current goals can now be found in the care plan section) Progress towards PT goals: Progressing toward goals    Frequency  7X/week    PT Plan Current plan remains appropriate    Co-evaluation             End of Session Equipment Utilized During Treatment: Gait belt Activity Tolerance: Patient tolerated treatment well Patient left: in chair;with call bell/phone within reach;with family/visitor present     Time: 5830-9407 PT Time  Calculation (min) (ACUTE ONLY): 24 min  Charges:  $Gait Training: 8-22 mins $Therapeutic Exercise: 8-22 mins                    G Codes:      Kingsley Callander 07/11/2014, 8:53 AM   Kittie Plater, PT, DPT Pager #: [phone] Office #: [phone]

## 2014-07-11 NOTE — Progress Notes (Signed)
PATIENT ID: Kristin Solomon  MRN: [id number]  DOB/AGE:  [birth year] / 81 y.o.  3 Days Post-Op Procedure(s) (LRB): TOTAL HIP REVISION (Left)    PROGRESS NOTE Subjective: Patient is alert, oriented, no Nausea, no Vomiting, yes passing gas, no Bowel Movement. Taking PO well. Denies SOB, Chest or Calf Pain. Using Incentive Spirometer, PAS in place. Ambulate WBAT Patient reports pain as 4 on 0-10 scale  .    Objective: Vital signs in last 24 hours: Filed Vitals:   07/10/14 2117 07/10/14 2224 07/11/14 0000 07/11/14 0657  BP: 116/46   107/45  Pulse: 120 116  109  Temp: 98 F (36.7 C)   97.6 F (36.4 C)  TempSrc: Oral   Oral  Resp: 18  18 16   Height:      Weight:      SpO2: 92%  93% 91%      Intake/Output from previous day: I/O last 3 completed shifts: In: 480 [P.O.:480] Out: -    Intake/Output this shift: Total I/O In: 480 [P.O.:480] Out: 200 [Urine:200]   LABORATORY DATA:  Recent Labs  07/09/14 0500 07/10/14 0520 07/11/14 0531  WBC 8.6 8.4 7.5  HGB 10.0* 9.0* 8.8*  HCT 29.4* 27.3* 26.1*  PLT 221 213 207  NA 135  --   --   K 4.8  --   --   CL 103  --   --   CO2 25  --   --   BUN 8  --   --   CREATININE 0.64  --   --   GLUCOSE 187*  --   --   CALCIUM 8.2*  --   --     Examination: Neurologically intact Neurovascular intact Sensation intact distally Intact pulses distally Dorsiflexion/Plantar flexion intact No cellulitis present Compartment soft} XR AP&Lat of hip shows well placed\fixed THA  Assessment:   3 Days Post-Op Procedure(s) (LRB): TOTAL HIP REVISION (Left) ADDITIONAL DIAGNOSIS:  Expected Acute Blood Loss Anemia,   Plan: PT/OT WBAT, THA  posterior precautions  DVT Prophylaxis: SCDx72 hrs, ASA 325 mg BID x 2 weeks  DISCHARGE PLAN: Skilled Nursing Facility/Rehab, today  DISCHARGE NEEDS: HHPT, HHRN, Walker and 3-in-1 comode seat

## 2014-07-11 NOTE — Clinical Social Work Note (Signed)
Patient to discharge today per MD order. Patient to discharge (back) to [facility] RN to call report prior to transportation to: [phone] and ask for Ms [place] nurse for report Transportation: [facility]  Patient is alert and oriented.  Discharge plans have been reviewed with patient.  Patient is aware and agreeable.  CSW is awaiting dc order/summary to send to SNF for review prior to discharge.  RNCM for MD has been made aware.  SNF is prepared for patient's admission today.  Nonnie Done, [place] [phone]  Psychiatric & Orthopedics (5N 1-16) Clinical Social Worker

## 2014-07-11 NOTE — Anesthesia Postprocedure Evaluation (Signed)
  Anesthesia Post-op Note  Patient: Kristin Solomon  Procedure(s) Performed: Procedure(s): TOTAL HIP REVISION (Left)  Patient Location: PACU  Anesthesia Type:General  Level of Consciousness: awake and alert   Airway and Oxygen Therapy: Patient Spontanous Breathing  Post-op Pain: mild  Post-op Assessment: Post-op Vital signs reviewed  Post-op Vital Signs: Reviewed  Last Vitals:  Filed Vitals:   07/11/14 0657  BP: 107/45  Pulse: 109  Temp: 36.4 C  Resp: 16    Complications: No apparent anesthesia complications

## 2014-07-12 ENCOUNTER — Encounter (HOSPITAL_COMMUNITY): Payer: Self-pay | Admitting: Orthopedic Surgery

## 2014-07-13 ENCOUNTER — Encounter: Payer: Self-pay | Admitting: Nurse Practitioner

## 2014-07-13 ENCOUNTER — Non-Acute Institutional Stay (SKILLED_NURSING_FACILITY): Payer: Medicare Other | Admitting: Nurse Practitioner

## 2014-07-13 DIAGNOSIS — M25552 Pain in left hip: Secondary | ICD-10-CM | POA: Diagnosis not present

## 2014-07-13 DIAGNOSIS — R682 Dry mouth, unspecified: Secondary | ICD-10-CM

## 2014-07-13 DIAGNOSIS — R000 Tachycardia, unspecified: Secondary | ICD-10-CM | POA: Diagnosis not present

## 2014-07-13 DIAGNOSIS — E785 Hyperlipidemia, unspecified: Secondary | ICD-10-CM | POA: Diagnosis not present

## 2014-07-13 DIAGNOSIS — D509 Iron deficiency anemia, unspecified: Secondary | ICD-10-CM | POA: Diagnosis not present

## 2014-07-13 DIAGNOSIS — K219 Gastro-esophageal reflux disease without esophagitis: Secondary | ICD-10-CM | POA: Diagnosis not present

## 2014-07-13 DIAGNOSIS — I10 Essential (primary) hypertension: Secondary | ICD-10-CM

## 2014-07-13 DIAGNOSIS — B0050 Herpesviral ocular disease, unspecified: Secondary | ICD-10-CM | POA: Diagnosis not present

## 2014-07-13 DIAGNOSIS — F418 Other specified anxiety disorders: Secondary | ICD-10-CM

## 2014-07-13 DIAGNOSIS — B0230 Zoster ocular disease, unspecified: Secondary | ICD-10-CM

## 2014-07-13 DIAGNOSIS — K5900 Constipation, unspecified: Secondary | ICD-10-CM

## 2014-07-13 NOTE — Assessment & Plan Note (Signed)
Stable, takes DSS 100mg  bid, Senna S II qd

## 2014-07-13 NOTE — Assessment & Plan Note (Signed)
Managed with Venlafaxine 150mg  qhs and prn Alprazolam 0.25mg  hs prn.

## 2014-07-13 NOTE — Progress Notes (Signed)
Patient ID: Kristin Solomon, female   DOB: 01-Aug-1929, 81 y.o.   MRN: [id number]   Code Status: DNR  Allergies  Allergen Reactions  . Morphine And Related Other (See Comments)    AGITATION, STRANGE DREAMS  . Scopace [Scopolamine] Other (See Comments)    MENTAL CHANGES    Chief Complaint  Patient presents with  . Medical Management of Chronic Issues  . Acute Visit    HPI: Patient is a 81 y.o. female seen in the SNF at [facility] today for evaluation of f/u hospitalization and chronic medical conditions.    07/08/14-07/11/14 hospitalized for operative treatment of painful left hemi-hip arthroplasty. Patient had severe unremitting pain that affects sleep, daily activities, and work/hobbies. 07/08/2014 and underwent Procedure(s): Left TOTAL HIP REVISION.    Hospitalized 06/12/2013 -06/15/2013 due to Fracturde of femoral neck, left - resulted from a fall on to her left sided in a retail store when she slip and tripped. The left hip total arthroplasty by Dr. Marchia Bond and she also had an injection to her left shoulder with steroid secondary to soft tissue injury(Hx of left shoulder surgery for rotator cuff)  Problem List Items Addressed This Visit    Tachycardia    Heart rate is in control.       Left hip pain - Primary    Hx of L hip fx-s/p THR about a year ago-then 07/08/14-07/11/14 hospitalized for operative treatment of painful left hemi-hip arthroplasty. Patient had severe unremitting pain that affects sleep, daily activities, and work/hobbies. 07/08/2014 and underwent Procedure(s): Left TOTAL HIP REVISION.  07/13/14 pain is managed with prn Tylenol, Oxycodone, and scheduled Tizanidine 2mg  tid. Takes ASA 325mg  bid for post op VTE risk reduction.         Hypertension    Controlled. Takes Metoprolol 50mg  am and 25mg  pm.        Hyperlipidemia    Takes Crestor 10mg  daily.       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       Dry mouth    Chronic for several years-Magic Mouth Wash S/S helped in the past-the patient desires to resume it.       Depression with anxiety    Managed with Venlafaxine 150mg  qhs and prn Alprazolam 0.25mg  hs prn.        Constipation    Stable, takes DSS 100mg  bid, Senna S II qd      Anemia, iron deficiency    Post Op, takes Fe, 07/11/14 Hg 8.8, update CBC in one week.          Review of Systems:  Review of Systems  Constitutional: Negative for fever, chills, weight loss, malaise/fatigue and diaphoresis.  HENT: Positive for hearing loss. Negative for congestion, ear discharge, ear pain, nosebleeds, sore throat and tinnitus.        C/o chronic dry mouth.   Eyes: Negative for blurred vision, double vision, photophobia, pain, discharge and redness.  Respiratory: Negative for cough, hemoptysis, sputum production, shortness of breath, wheezing and stridor.   Cardiovascular: Positive for leg swelling. Negative for chest pain, palpitations, orthopnea, claudication and PND.       Left leg  Gastrointestinal: Negative for heartburn, nausea, vomiting, abdominal pain, diarrhea, constipation, blood in stool and melena.  Genitourinary: Positive for frequency. Negative for dysuria, urgency, hematuria and flank pain.  Musculoskeletal:  Positive for joint pain. Negative for myalgias, back pain, falls and neck pain.       Left hip pain.   Skin: Negative for itching and rash.       Left hip surgical incision intact. No s/s of peri wound cellulitis.   Neurological: Positive for weakness. Negative for dizziness, tingling, tremors, sensory change, speech change, focal weakness, seizures, loss of consciousness and headaches.  Endo/Heme/Allergies: Negative for environmental allergies and polydipsia. Does not bruise/bleed easily.  Psychiatric/Behavioral: Positive for depression and memory loss. Negative for suicidal ideas,  hallucinations and substance abuse. The patient is nervous/anxious and has insomnia.      Past Medical History  Diagnosis Date  . Herpes ocular     history of  . GERD (gastroesophageal reflux disease)   . Depression   . High cholesterol   . Hypertension     under control; has been on med. x 10 yr.  . History of hiatal hernia   . Osteoarthritis of knee     bilateral  . Anemia, iron deficiency   . Dental crowns present   . Fracture of femoral neck, left 06/12/2013  . Stenosing tenosynovitis of finger 08/2012    right index finger  . Sciatic pain   . Gait disorder 04/21/2014   Past Surgical History  Procedure Laterality Date  . Lumbar laminectomy/decompression microdiscectomy  08/29/2010    L2-S1  . Shoulder arthroscopy with rotator cuff repair and subacromial decompression Right 01/01/2000  . Total knee arthroplasty Left 05/14/2004  . Abdominal hysterectomy  1995    partial  . Laparoscopic nissen fundoplication  3/87/5643  . Descemets stripping automated endothelial keratoplasty Left 03/14/2011  . Tonsillectomy      as child  . Colonoscopy    . Total knee arthroplasty  12/23/2011    Procedure: TOTAL KNEE ARTHROPLASTY;  Surgeon: Lorn Junes, MD;  Location: [place];  Service: Orthopedics;  Laterality: Right;  DR Mather THIS CASE  . Knee arthroscopy Right 05/11/2001  . Trigger finger release Right 07/21/2007    thumb  . Carpal tunnel release Right 07/21/2007  . Trigger finger release Left 09/24/2007    middle finger  . Carpal tunnel release Left 09/24/2007  . Trigger finger release Right 03/10/2008    ring and little fingers  . Descemets stripping automated endothelial keratoplasty Right 08/20/2012  . Trigger finger release Right 09/02/2012    Procedure: RELEASE TRIGGER FINGER/A-1 PULLEY RIGHT INDEX FINGER;  Surgeon: Wynonia Sours, MD;  Location: [place];  Service: Orthopedics;  Laterality: Right;  . Trigger finger release Left 12/22/2012     Procedure: RELEASE TRIGGER FINGER/A-1 PULLEY LEFT RING FINGER;  Surgeon: Wynonia Sours, MD;  Location: [place];  Service: Orthopedics;  Laterality: Left;  Left   . Joint replacement      bilateral knees, left knee  . Hip arthroplasty Left 06/13/2013    Procedure: ARTHROPLASTY BIPOLAR HIP; Injection left shoulder;  Surgeon: Johnny Bridge, MD;  Location: [place];  Service: Orthopedics;  Laterality: Left;  . Hernia repair    . Eye surgery Bilateral   . Total hip revision Left 07/08/2014    Procedure: TOTAL HIP REVISION;  Surgeon: Kerin Salen, MD;  Location: [facility];  Service: Orthopedics;  Laterality: Left;   Social History:   reports that she has quit smoking. She has never used smokeless tobacco. She reports that she does not drink alcohol or use illicit drugs.  Family  History  Problem Relation Age of Onset  . Heart attack Mother   . Parkinsonism Father   . Diabetes Brother     Medications: Patient's Medications  New Prescriptions   No medications on file  Previous Medications   ACETAMINOPHEN (TYLENOL) 500 MG TABLET    Take 500 mg by mouth every 6 (six) hours as needed for pain.   ACYCLOVIR (ZOVIRAX) 400 MG TABLET    Take 800 mg by mouth daily.    ALPRAZOLAM (XANAX) 0.5 MG TABLET    Take 0.25 mg by mouth at bedtime as needed for sleep.   ALUM & MAG HYDROXIDE-SIMETH (MAALOX/MYLANTA) 200-200-20 MG/5ML SUSPENSION    Take 30 mLs by mouth every 4 (four) hours as needed for indigestion.   ASPIRIN EC 325 MG TABLET    Take 1 tablet (325 mg total) by mouth 2 (two) times daily.   ASPIRIN EC 81 MG TABLET    Take 81 mg by mouth daily.   CHOLECALCIFEROL (VITAMIN D) 1000 UNITS TABLET    Take 2,000 Units by mouth daily.   DOCUSATE SODIUM 100 MG CAPS    Take 100 mg by mouth 2 (two) times daily.   FERROUS SULFATE 325 (65 FE) MG TABLET    Take 325 mg by mouth daily.   MENTHOL-CETYLPYRIDINIUM (CEPACOL) 3 MG LOZENGE    Take 1 lozenge (3 mg total) by mouth as needed for sore throat (sore  throat).   METOPROLOL (LOPRESSOR) 50 MG TABLET    Take 25-50 mg by mouth 2 (two) times daily. Take 50 MG in the morning and take 25 MG at bedtime.   OMEPRAZOLE (PRILOSEC) 40 MG CAPSULE    Take 40 mg by mouth 2 (two) times daily.    OXYCODONE-ACETAMINOPHEN (ROXICET) 5-325 MG PER TABLET    Take 1 tablet by mouth every 4 (four) hours as needed.   ROSUVASTATIN (CRESTOR) 10 MG TABLET    Take 10 mg by mouth daily.   SENNOSIDES-DOCUSATE SODIUM (SENOKOT-S) 8.6-50 MG TABLET    Take 2 tablets by mouth daily.   TIZANIDINE (ZANAFLEX) 2 MG CAPSULE    Take 1 capsule (2 mg total) by mouth 3 (three) times daily.   VENLAFAXINE XR (EFFEXOR-XR) 75 MG 24 HR CAPSULE    Take 150 mg by mouth at bedtime.   Modified Medications   No medications on file  Discontinued Medications   No medications on file     Physical Exam: Physical Exam  Constitutional: She is oriented to person, place, and time. She appears well-developed and well-nourished. No distress.  HENT:  Head: Normocephalic and atraumatic.  Right Ear: External ear normal.  Left Ear: External ear normal.  Nose: Nose normal.  Mouth/Throat: Oropharynx is clear and moist. No oropharyngeal exudate.  Tongue is mildly inflamed.   Eyes: Conjunctivae and EOM are normal. Pupils are equal, round, and reactive to light. Right eye exhibits no discharge. Left eye exhibits no discharge. No scleral icterus.  Neck: Normal range of motion. Neck supple. No JVD present. No tracheal deviation present. No thyromegaly present.  Cardiovascular: Normal rate, regular rhythm, normal heart sounds and intact distal pulses.   No murmur heard. HR 90s at rest.   Pulmonary/Chest: Effort normal and breath sounds normal. No stridor. No respiratory distress. She has no wheezes. She has no rales. She exhibits no tenderness.  Abdominal: Soft. Bowel sounds are normal. She exhibits no distension. There is no tenderness. There is no rebound and no guarding.  Musculoskeletal: She exhibits  edema and  tenderness.  Left hip and left leg. Left shoulder pain-chronic.   Lymphadenopathy:    She has no cervical adenopathy.  Neurological: She is alert and oriented to person, place, and time. She has normal reflexes. No cranial nerve deficit. She exhibits normal muscle tone. Coordination normal.  Skin: Skin is warm and dry. No rash noted. She is not diaphoretic. No erythema. There is pallor.  Left hip surgical incision intact and no s/s of peri wound cellulitis.   Psychiatric: She has a normal mood and affect. Her behavior is normal. Judgment and thought content normal.    Filed Vitals:   07/13/14 1106  BP: 152/76  Pulse: 77  Temp: 97.7 F (36.5 C)  TempSrc: Tympanic  Resp: 20      Labs reviewed: Basic Metabolic Panel:  Recent Labs  06/28/14 0950 07/09/14 0500  NA 136 135  K 4.4 4.8  CL 103 103  CO2 25 25  GLUCOSE 90 187*  BUN 13 8  CREATININE 0.69 0.64  CALCIUM 9.3 8.2*   Liver Function Tests: No results for input(s): AST, ALT, ALKPHOS, BILITOT, PROT, ALBUMIN in the last 8760 hours. CBC:  Recent Labs  06/28/14 0950 07/09/14 0500 07/10/14 0520 07/11/14 0531  WBC 6.3 8.6 8.4 7.5  NEUTROABS 2.4  --   --   --   HGB 12.7 10.0* 9.0* 8.8*  HCT 37.9 29.4* 27.3* 26.1*  MCV 96.7 99.3 98.2 99.2  PLT 295 221 213 207    Past Procedures:  Significant Diagnostic Studies:   06/12/2013 CLINICAL DATA: History of fall complaining of left sided pain. EXAM: CHEST - 1 VIEW IMPRESSION: 1. Probable subsegmental atelectasis in the left lower lobe. In the setting of recent trauma with chest pain, this may be related to splinting.   06/12/2013 CLINICAL DATA: History of fall complaining of pelvic pain, particularly in the left hip. EXAM: BILATERAL HIP WITH PELVIS IMPRESSION: 1. Acute mildly displaced transcervical femoral neck fracture of the left hip.   06/12/2013 CLINICAL DATA: History of trauma from a fall. EXAM: CT HEAD WITHOUT CONTRAST CT CERVICAL SPINE WITHOUT CONTRAST  IMPRESSION: 1. No evidence of significant acute traumatic injury to the head, brain or cervical spine. 2. Mild cerebral atrophy with chronic microvascular ischemic changes in the cerebral white matter. 3. Multilevel degenerative disc disease and cervical spondylosis redemonstrated, as above.   06/12/2013 CLINICAL DATA: History of trauma from a fall. EXAM: CT HEAD WITHOUT CONTRAST CT CERVICAL SPINE WITHOUT CONTRAST IMPRESSION: 1. No evidence of significant acute traumatic injury to the head, brain or cervical spine. 2. Mild cerebral atrophy with chronic microvascular ischemic changes in the cerebral white matter. 3. Multilevel degenerative disc disease and cervical spondylosis redemonstrated, as above.   06/13/2013 CLINICAL DATA: Left hip replacement. EXAM: PORTABLE PELVIS 1-2 VIEWS IMPRESSION: Left hip replacement with good anatomic alignment on AP view.   06/12/2013 CLINICAL DATA: History of fall complaining of left shoulder pain. EXAM: LEFT SHOULDER - 2+ VIEW IMPRESSION: 1. No acute radiographic abnormality of the left shoulder. 2. Findings compatible with rotator cuff disease and osteoarthritis in the glenohumeral and acromioclavicular joints.   06/13/2013 CLINICAL DATA: Hip replacement. EXAM: PORTABLE LEFT HIP - 1 VIEW COMPARISON: AP pelvis 06/13/2013 IMPRESSION: Left hip replacement. Good anatomic alignment.   07/08/14 X-ray   IMPRESSION: Left hip arthroplasty. No complicating features.  Assessment/Plan Left hip pain Hx of L hip fx-s/p THR about a year ago-then 07/08/14-07/11/14 hospitalized for operative treatment of painful left hemi-hip arthroplasty. Patient had severe unremitting pain that  affects sleep, daily activities, and work/hobbies. 07/08/2014 and underwent Procedure(s): Left TOTAL HIP REVISION.  07/13/14 pain is managed with prn Tylenol, Oxycodone, and scheduled Tizanidine 2mg  tid. Takes ASA 325mg  bid for post op VTE risk reduction.      Herpes ocular No flare ups, takes  Acyclovir 400mg  daily     Anemia, iron deficiency Post Op, takes Fe, 07/11/14 Hg 8.8, update CBC in one week.    GERD (gastroesophageal reflux disease) Stable, takes Omeprazole 40mg  daily.    Constipation Stable, takes DSS 100mg  bid, Senna S II qd   Hypertension Controlled. Takes Metoprolol 50mg  am and 25mg  pm.     Tachycardia Heart rate is in control.    Hyperlipidemia Takes Crestor 10mg  daily.    Depression with anxiety Managed with Venlafaxine 150mg  qhs and prn Alprazolam 0.25mg  hs prn.     Dry mouth Chronic for several years-Magic Mouth Wash S/S helped in the past-the patient desires to resume it.      Family/ Staff Communication: observe the patient  Goals of Care: [place]-here for Rehab.   Labs/tests ordered: CBC

## 2014-07-13 NOTE — Assessment & Plan Note (Signed)
Takes Crestor 10mg  daily.

## 2014-07-13 NOTE — Assessment & Plan Note (Addendum)
No flare ups, takes Acyclovir 400mg  daily

## 2014-07-13 NOTE — Assessment & Plan Note (Signed)
Controlled. Takes Metoprolol 50mg  am and 25mg  pm.

## 2014-07-13 NOTE — Assessment & Plan Note (Signed)
Post Op, takes Fe, 07/11/14 Hg 8.8, update CBC in one week.

## 2014-07-13 NOTE — Assessment & Plan Note (Addendum)
Hx of L hip fx-s/p THR about a year ago-then 07/08/14-07/11/14 hospitalized for operative treatment of painful left hemi-hip arthroplasty. Patient had severe unremitting pain that affects sleep, daily activities, and work/hobbies. 07/08/2014 and underwent Procedure(s): Left TOTAL HIP REVISION.  07/13/14 pain is managed with prn Tylenol, Oxycodone, and scheduled Tizanidine 2mg  tid. Takes ASA 325mg  bid for post op VTE risk reduction.

## 2014-07-13 NOTE — Assessment & Plan Note (Signed)
Heart rate is in control.  

## 2014-07-13 NOTE — Assessment & Plan Note (Signed)
Stable, takes Omeprazole 40mg  daily.

## 2014-07-13 NOTE — Assessment & Plan Note (Signed)
Chronic for several years-Magic Mouth Wash S/S helped in the past-the patient desires to resume it.

## 2014-07-19 LAB — CBC AND DIFFERENTIAL
HEMATOCRIT: 30 % — AB (ref 36–46)
Hemoglobin: 9.6 g/dL — AB (ref 12.0–16.0)
PLATELETS: 423 10*3/uL — AB (ref 150–399)
WBC: 6.3 10^3/mL

## 2014-07-20 ENCOUNTER — Other Ambulatory Visit: Payer: Self-pay | Admitting: Nurse Practitioner

## 2014-07-20 ENCOUNTER — Encounter: Payer: Self-pay | Admitting: Internal Medicine

## 2014-07-20 DIAGNOSIS — M25552 Pain in left hip: Secondary | ICD-10-CM

## 2014-07-20 DIAGNOSIS — D509 Iron deficiency anemia, unspecified: Secondary | ICD-10-CM

## 2014-07-20 NOTE — Progress Notes (Signed)
Patient ID: Kristin Solomon, female   DOB: 1930-04-06, 81 y.o.   MRN: [id number] Patient ID: Kristin Solomon, female   DOB: Oct 04, 1929, 81 y.o.   MRN: [id number]   Code Status: DNR  Allergies  Allergen Reactions  . Morphine And Related Other (See Comments)    AGITATION, STRANGE DREAMS  . Scopace [Scopolamine] Other (See Comments)    MENTAL CHANGES    Chief Complaint  Patient presents with  . New Admit To SNF    new admission s/p hospitalization with left total hip revision due to persistent pain after left total hip revision    HPI: Patient is a 81 y.o. female seen in the SNF at [facility] today for evaluation of f/u hospitalization and chronic medical conditions.    07/08/14-07/11/14 hospitalized for operative treatment of painful left hemi-hip arthroplasty. Patient had severe unremitting pain that affects sleep, daily activities, and work/hobbies. 07/08/2014 and underwent Procedure(s): Left TOTAL HIP REVISION.    Hospitalized 06/12/2013 -06/15/2013 due to Fracturde of femoral neck, left - resulted from a fall on to her left sided in a retail store when she slip and tripped. The left hip total arthroplasty by Dr. Marchia Bond and she also had an injection to her left shoulder with steroid secondary to soft tissue injury(Hx of left shoulder surgery for rotator cuff)  Problem List Items Addressed This Visit    None      Review of Systems:  ROS   Past Medical History  Diagnosis Date  . Herpes ocular     history of  . GERD (gastroesophageal reflux disease)   . Depression   . High cholesterol   . Hypertension     under control; has been on med. x 10 yr.  . History of hiatal hernia   . Osteoarthritis of knee     bilateral  . Anemia, iron deficiency   . Dental crowns present   . Fracture of femoral neck, left 06/12/2013  . Stenosing tenosynovitis of finger 08/2012    right index finger  . Sciatic pain   . Gait disorder 04/21/2014   Past Surgical History    Procedure Laterality Date  . Lumbar laminectomy/decompression microdiscectomy  08/29/2010    L2-S1  . Shoulder arthroscopy with rotator cuff repair and subacromial decompression Right 01/01/2000  . Total knee arthroplasty Left 05/14/2004  . Abdominal hysterectomy  1995    partial  . Laparoscopic nissen fundoplication  8/52/7782  . Descemets stripping automated endothelial keratoplasty Left 03/14/2011  . Tonsillectomy      as child  . Colonoscopy    . Total knee arthroplasty  12/23/2011    Procedure: TOTAL KNEE ARTHROPLASTY;  Surgeon: Lorn Junes, MD;  Location: [place];  Service: Orthopedics;  Laterality: Right;  DR [place] THIS CASE  . Knee arthroscopy Right 05/11/2001  . Trigger finger release Right 07/21/2007    thumb  . Carpal tunnel release Right 07/21/2007  . Trigger finger release Left 09/24/2007    middle finger  . Carpal tunnel release Left 09/24/2007  . Trigger finger release Right 03/10/2008    ring and little fingers  . Descemets stripping automated endothelial keratoplasty Right 08/20/2012  . Trigger finger release Right 09/02/2012    Procedure: RELEASE TRIGGER FINGER/A-1 PULLEY RIGHT INDEX FINGER;  Surgeon: Wynonia Sours, MD;  Location: [place];  Service: Orthopedics;  Laterality: Right;  . Trigger finger release Left 12/22/2012    Procedure: RELEASE TRIGGER FINGER/A-1 PULLEY  LEFT RING FINGER;  Surgeon: Wynonia Sours, MD;  Location: [place];  Service: Orthopedics;  Laterality: Left;  Left   . Joint replacement      bilateral knees, left knee  . Hip arthroplasty Left 06/13/2013    Procedure: ARTHROPLASTY BIPOLAR HIP; Injection left shoulder;  Surgeon: Johnny Bridge, MD;  Location: [place];  Service: Orthopedics;  Laterality: Left;  . Hernia repair    . Eye surgery Bilateral   . Total hip revision Left 07/08/2014    Procedure: TOTAL HIP REVISION;  Surgeon: Kerin Salen, MD;  Location: [place];  Service: Orthopedics;  Laterality:  Left;   Social History:   reports that she has quit smoking. She has never used smokeless tobacco. She reports that she does not drink alcohol or use illicit drugs.  Family History  Problem Relation Age of Onset  . Heart attack Mother   . Parkinsonism Father   . Diabetes Brother     Medications: Patient's Medications  New Prescriptions   No medications on file  Previous Medications   ACETAMINOPHEN (TYLENOL) 500 MG TABLET    Take 500 mg by mouth every 6 (six) hours as needed for pain.   ACYCLOVIR (ZOVIRAX) 400 MG TABLET    Take 800 mg by mouth daily.    ALPRAZOLAM (XANAX) 0.5 MG TABLET    Take 0.25 mg by mouth at bedtime as needed for sleep.   ALUM & MAG HYDROXIDE-SIMETH (MAALOX/MYLANTA) 200-200-20 MG/5ML SUSPENSION    Take 30 mLs by mouth every 4 (four) hours as needed for indigestion.   ASPIRIN EC 325 MG TABLET    Take 1 tablet (325 mg total) by mouth 2 (two) times daily.   ASPIRIN EC 81 MG TABLET    Take 81 mg by mouth daily.   CHOLECALCIFEROL (VITAMIN D) 1000 UNITS TABLET    Take 2,000 Units by mouth daily.   DOCUSATE SODIUM 100 MG CAPS    Take 100 mg by mouth 2 (two) times daily.   FERROUS SULFATE 325 (65 FE) MG TABLET    Take 325 mg by mouth daily.   MENTHOL-CETYLPYRIDINIUM (CEPACOL) 3 MG LOZENGE    Take 1 lozenge (3 mg total) by mouth as needed for sore throat (sore throat).   METOPROLOL (LOPRESSOR) 50 MG TABLET    Take 25-50 mg by mouth 2 (two) times daily. Take 50 MG in the morning and take 25 MG at bedtime.   OMEPRAZOLE (PRILOSEC) 40 MG CAPSULE    Take 40 mg by mouth 2 (two) times daily.    OXYCODONE-ACETAMINOPHEN (ROXICET) 5-325 MG PER TABLET    Take 1 tablet by mouth every 4 (four) hours as needed.   ROSUVASTATIN (CRESTOR) 10 MG TABLET    Take 10 mg by mouth daily.   SENNOSIDES-DOCUSATE SODIUM (SENOKOT-S) 8.6-50 MG TABLET    Take 2 tablets by mouth daily.   TIZANIDINE (ZANAFLEX) 2 MG CAPSULE    Take 1 capsule (2 mg total) by mouth 3 (three) times daily.   VENLAFAXINE XR  (EFFEXOR-XR) 75 MG 24 HR CAPSULE    Take 150 mg by mouth at bedtime.   Modified Medications   No medications on file  Discontinued Medications   No medications on file     Physical Exam: Physical Exam  Constitutional: She is oriented to person, place, and time. She appears well-developed and well-nourished. No distress.  HENT:  Head: Normocephalic and atraumatic.  Right Ear: External ear normal.  Left Ear: External ear  normal.  Nose: Nose normal.  Mouth/Throat: Oropharynx is clear and moist. No oropharyngeal exudate.  Tongue is mildly inflamed.   Eyes: Conjunctivae and EOM are normal. Pupils are equal, round, and reactive to light. Right eye exhibits no discharge. Left eye exhibits no discharge. No scleral icterus.  Neck: Normal range of motion. Neck supple. No JVD present. No tracheal deviation present. No thyromegaly present.  Cardiovascular: Normal rate, regular rhythm, normal heart sounds and intact distal pulses.   No murmur heard. HR 90s at rest.   Pulmonary/Chest: Effort normal and breath sounds normal. No stridor. No respiratory distress. She has no wheezes. She has no rales. She exhibits no tenderness.  Abdominal: Soft. Bowel sounds are normal. She exhibits no distension. There is no tenderness. There is no rebound and no guarding.  Musculoskeletal: She exhibits edema and tenderness.  Left hip and left leg. Left shoulder pain-chronic.   Lymphadenopathy:    She has no cervical adenopathy.  Neurological: She is alert and oriented to person, place, and time. She has normal reflexes. No cranial nerve deficit. She exhibits normal muscle tone. Coordination normal.  Skin: Skin is warm and dry. No rash noted. She is not diaphoretic. No erythema. There is pallor.  Left hip surgical incision intact and no s/s of peri wound cellulitis.   Psychiatric: She has a normal mood and affect. Her behavior is normal. Judgment and thought content normal.    There were no vitals filed for this  visit.    Labs reviewed: Basic Metabolic Panel:  Recent Labs  06/28/14 0950 07/09/14 0500  NA 136 135  K 4.4 4.8  CL 103 103  CO2 25 25  GLUCOSE 90 187*  BUN 13 8  CREATININE 0.69 0.64  CALCIUM 9.3 8.2*   Liver Function Tests: No results for input(s): AST, ALT, ALKPHOS, BILITOT, PROT, ALBUMIN in the last 8760 hours. CBC:  Recent Labs  06/28/14 0950 07/09/14 0500 07/10/14 0520 07/11/14 0531  WBC 6.3 8.6 8.4 7.5  NEUTROABS 2.4  --   --   --   HGB 12.7 10.0* 9.0* 8.8*  HCT 37.9 29.4* 27.3* 26.1*  MCV 96.7 99.3 98.2 99.2  PLT 295 221 213 207    Past Procedures:  Significant Diagnostic Studies:   06/12/2013 CLINICAL DATA: History of fall complaining of left sided pain. EXAM: CHEST - 1 VIEW IMPRESSION: 1. Probable subsegmental atelectasis in the left lower lobe. In the setting of recent trauma with chest pain, this may be related to splinting.   06/12/2013 CLINICAL DATA: History of fall complaining of pelvic pain, particularly in the left hip. EXAM: BILATERAL HIP WITH PELVIS IMPRESSION: 1. Acute mildly displaced transcervical femoral neck fracture of the left hip.   06/12/2013 CLINICAL DATA: History of trauma from a fall. EXAM: CT HEAD WITHOUT CONTRAST CT CERVICAL SPINE WITHOUT CONTRAST IMPRESSION: 1. No evidence of significant acute traumatic injury to the head, brain or cervical spine. 2. Mild cerebral atrophy with chronic microvascular ischemic changes in the cerebral white matter. 3. Multilevel degenerative disc disease and cervical spondylosis redemonstrated, as above.   06/12/2013 CLINICAL DATA: History of trauma from a fall. EXAM: CT HEAD WITHOUT CONTRAST CT CERVICAL SPINE WITHOUT CONTRAST IMPRESSION: 1. No evidence of significant acute traumatic injury to the head, brain or cervical spine. 2. Mild cerebral atrophy with chronic microvascular ischemic changes in the cerebral white matter. 3. Multilevel degenerative disc disease and cervical spondylosis redemonstrated,  as above.   06/13/2013 CLINICAL DATA: Left hip replacement. EXAM: PORTABLE PELVIS  1-2 VIEWS IMPRESSION: Left hip replacement with good anatomic alignment on AP view.   06/12/2013 CLINICAL DATA: History of fall complaining of left shoulder pain. EXAM: LEFT SHOULDER - 2+ VIEW IMPRESSION: 1. No acute radiographic abnormality of the left shoulder. 2. Findings compatible with rotator cuff disease and osteoarthritis in the glenohumeral and acromioclavicular joints.   06/13/2013 CLINICAL DATA: Hip replacement. EXAM: PORTABLE LEFT HIP - 1 VIEW COMPARISON: AP pelvis 06/13/2013 IMPRESSION: Left hip replacement. Good anatomic alignment.   07/08/14 X-ray   IMPRESSION: Left hip arthroplasty. No complicating features.  Assessment/Plan No problem-specific assessment & plan notes found for this encounter.   Family/ Staff Communication: observe the patient  Goals of Care: [place]-here for Rehab.   Labs/tests ordered: CBC   This encounter was created in error - please disregard.

## 2014-07-21 DIAGNOSIS — T849XXA Unspecified complication of internal orthopedic prosthetic device, implant and graft, initial encounter: Secondary | ICD-10-CM | POA: Diagnosis not present

## 2014-07-27 DIAGNOSIS — M6281 Muscle weakness (generalized): Secondary | ICD-10-CM | POA: Diagnosis not present

## 2014-07-27 DIAGNOSIS — Z471 Aftercare following joint replacement surgery: Secondary | ICD-10-CM | POA: Diagnosis not present

## 2014-07-27 DIAGNOSIS — R2681 Unsteadiness on feet: Secondary | ICD-10-CM | POA: Diagnosis not present

## 2014-07-29 DIAGNOSIS — Z471 Aftercare following joint replacement surgery: Secondary | ICD-10-CM | POA: Diagnosis not present

## 2014-07-29 DIAGNOSIS — R2681 Unsteadiness on feet: Secondary | ICD-10-CM | POA: Diagnosis not present

## 2014-07-29 DIAGNOSIS — M6281 Muscle weakness (generalized): Secondary | ICD-10-CM | POA: Diagnosis not present

## 2014-08-01 DIAGNOSIS — M6281 Muscle weakness (generalized): Secondary | ICD-10-CM | POA: Diagnosis not present

## 2014-08-01 DIAGNOSIS — R2681 Unsteadiness on feet: Secondary | ICD-10-CM | POA: Diagnosis not present

## 2014-08-01 DIAGNOSIS — Z471 Aftercare following joint replacement surgery: Secondary | ICD-10-CM | POA: Diagnosis not present

## 2014-08-02 DIAGNOSIS — R300 Dysuria: Secondary | ICD-10-CM | POA: Diagnosis not present

## 2014-08-03 DIAGNOSIS — M6281 Muscle weakness (generalized): Secondary | ICD-10-CM | POA: Diagnosis not present

## 2014-08-03 DIAGNOSIS — Z471 Aftercare following joint replacement surgery: Secondary | ICD-10-CM | POA: Diagnosis not present

## 2014-08-03 DIAGNOSIS — R2681 Unsteadiness on feet: Secondary | ICD-10-CM | POA: Diagnosis not present

## 2014-08-05 DIAGNOSIS — M6281 Muscle weakness (generalized): Secondary | ICD-10-CM | POA: Diagnosis not present

## 2014-08-05 DIAGNOSIS — Z471 Aftercare following joint replacement surgery: Secondary | ICD-10-CM | POA: Diagnosis not present

## 2014-08-05 DIAGNOSIS — R2681 Unsteadiness on feet: Secondary | ICD-10-CM | POA: Diagnosis not present

## 2014-08-08 DIAGNOSIS — R2681 Unsteadiness on feet: Secondary | ICD-10-CM | POA: Diagnosis not present

## 2014-08-08 DIAGNOSIS — Z471 Aftercare following joint replacement surgery: Secondary | ICD-10-CM | POA: Diagnosis not present

## 2014-08-08 DIAGNOSIS — M6281 Muscle weakness (generalized): Secondary | ICD-10-CM | POA: Diagnosis not present

## 2014-08-10 DIAGNOSIS — R2681 Unsteadiness on feet: Secondary | ICD-10-CM | POA: Diagnosis not present

## 2014-08-10 DIAGNOSIS — M6281 Muscle weakness (generalized): Secondary | ICD-10-CM | POA: Diagnosis not present

## 2014-08-10 DIAGNOSIS — Z471 Aftercare following joint replacement surgery: Secondary | ICD-10-CM | POA: Diagnosis not present

## 2014-08-12 DIAGNOSIS — Z471 Aftercare following joint replacement surgery: Secondary | ICD-10-CM | POA: Diagnosis not present

## 2014-08-12 DIAGNOSIS — R2681 Unsteadiness on feet: Secondary | ICD-10-CM | POA: Diagnosis not present

## 2014-08-12 DIAGNOSIS — M6281 Muscle weakness (generalized): Secondary | ICD-10-CM | POA: Diagnosis not present

## 2014-08-15 DIAGNOSIS — Z471 Aftercare following joint replacement surgery: Secondary | ICD-10-CM | POA: Diagnosis not present

## 2014-08-15 DIAGNOSIS — Z947 Corneal transplant status: Secondary | ICD-10-CM | POA: Diagnosis not present

## 2014-08-15 DIAGNOSIS — R2681 Unsteadiness on feet: Secondary | ICD-10-CM | POA: Diagnosis not present

## 2014-08-15 DIAGNOSIS — M6281 Muscle weakness (generalized): Secondary | ICD-10-CM | POA: Diagnosis not present

## 2014-08-15 DIAGNOSIS — H1851 Endothelial corneal dystrophy: Secondary | ICD-10-CM | POA: Diagnosis not present

## 2014-08-15 DIAGNOSIS — H16142 Punctate keratitis, left eye: Secondary | ICD-10-CM | POA: Diagnosis not present

## 2014-08-17 DIAGNOSIS — M6281 Muscle weakness (generalized): Secondary | ICD-10-CM | POA: Diagnosis not present

## 2014-08-17 DIAGNOSIS — R2681 Unsteadiness on feet: Secondary | ICD-10-CM | POA: Diagnosis not present

## 2014-08-17 DIAGNOSIS — Z471 Aftercare following joint replacement surgery: Secondary | ICD-10-CM | POA: Diagnosis not present

## 2014-08-17 DIAGNOSIS — Z96642 Presence of left artificial hip joint: Secondary | ICD-10-CM | POA: Diagnosis not present

## 2014-08-18 DIAGNOSIS — L821 Other seborrheic keratosis: Secondary | ICD-10-CM | POA: Diagnosis not present

## 2014-08-19 DIAGNOSIS — M6281 Muscle weakness (generalized): Secondary | ICD-10-CM | POA: Diagnosis not present

## 2014-08-19 DIAGNOSIS — Z471 Aftercare following joint replacement surgery: Secondary | ICD-10-CM | POA: Diagnosis not present

## 2014-08-19 DIAGNOSIS — R2681 Unsteadiness on feet: Secondary | ICD-10-CM | POA: Diagnosis not present

## 2014-08-22 DIAGNOSIS — R300 Dysuria: Secondary | ICD-10-CM | POA: Diagnosis not present

## 2014-08-25 DIAGNOSIS — Z1231 Encounter for screening mammogram for malignant neoplasm of breast: Secondary | ICD-10-CM | POA: Diagnosis not present

## 2014-09-28 DIAGNOSIS — Z96642 Presence of left artificial hip joint: Secondary | ICD-10-CM | POA: Diagnosis not present

## 2014-09-28 DIAGNOSIS — T84091A Other mechanical complication of internal left hip prosthesis, initial encounter: Secondary | ICD-10-CM | POA: Diagnosis not present

## 2014-09-28 DIAGNOSIS — Z96659 Presence of unspecified artificial knee joint: Secondary | ICD-10-CM | POA: Diagnosis not present

## 2014-09-28 DIAGNOSIS — Z471 Aftercare following joint replacement surgery: Secondary | ICD-10-CM | POA: Diagnosis not present

## 2014-11-14 DIAGNOSIS — Z961 Presence of intraocular lens: Secondary | ICD-10-CM | POA: Diagnosis not present

## 2014-11-14 DIAGNOSIS — H02403 Unspecified ptosis of bilateral eyelids: Secondary | ICD-10-CM | POA: Diagnosis not present

## 2014-11-14 DIAGNOSIS — H26491 Other secondary cataract, right eye: Secondary | ICD-10-CM | POA: Diagnosis not present

## 2014-11-14 DIAGNOSIS — H04123 Dry eye syndrome of bilateral lacrimal glands: Secondary | ICD-10-CM | POA: Diagnosis not present

## 2014-11-14 DIAGNOSIS — H40001 Preglaucoma, unspecified, right eye: Secondary | ICD-10-CM | POA: Diagnosis not present

## 2014-11-14 DIAGNOSIS — Z947 Corneal transplant status: Secondary | ICD-10-CM | POA: Diagnosis not present

## 2014-12-13 ENCOUNTER — Ambulatory Visit: Payer: Medicare Other | Admitting: Podiatry

## 2014-12-20 ENCOUNTER — Ambulatory Visit: Payer: Medicare Other | Admitting: Podiatry

## 2014-12-21 ENCOUNTER — Encounter: Payer: Self-pay | Admitting: Podiatry

## 2014-12-21 ENCOUNTER — Ambulatory Visit (INDEPENDENT_AMBULATORY_CARE_PROVIDER_SITE_OTHER): Payer: Medicare Other | Admitting: Podiatry

## 2014-12-21 VITALS — BP 130/68 | HR 69 | Resp 12

## 2014-12-21 DIAGNOSIS — Q828 Other specified congenital malformations of skin: Secondary | ICD-10-CM | POA: Diagnosis not present

## 2014-12-21 DIAGNOSIS — M216X2 Other acquired deformities of left foot: Secondary | ICD-10-CM | POA: Diagnosis not present

## 2014-12-21 NOTE — Patient Instructions (Signed)
Leave acid bandage on the bottom of the left foot if possible 3-5 days. Removed bandage if gets wet  Return as needed

## 2014-12-21 NOTE — Progress Notes (Signed)
   Subjective:    Patient ID: Kristin Solomon, female    DOB: 08/08/1929, 81 y.o.   MRN: [id number]  HPI  N-THICK SKIN, PAINFUL L-LT FOOT 5TH MET. D-1 MONTH C-WORSE O-SLOWLY A-PRESSURE WHEN WALKING T-PADDING FOR COMFORT   Review of Systems  Musculoskeletal: Positive for gait problem.  Skin: Positive for color change.  Neurological: Positive for weakness.       Objective:   Physical Exam  Orientated 3  Vascular: No peripheral edema noted bilaterally DP and PT pulses 2/4 bilaterally Capillary reflex immediate bilaterally  Neurological: Ankle reflexes trace reactive bilaterally Vibratory sensation intact bilaterally Sensation to 10 g monofilament wire intact 4/5 right 5/5 left  Dermatological: Atrophic skin without any hair growth bilaterally Nucleated plantar keratoses fifth MPJ left  Musculoskeletal: Hammertoe deformities fourth bilaterally Relative plantar flexion deformity fifth metatarsal head left Patient walks with assistance of walker slowly with stable gait      Assessment & Plan:   Assessment: Satisfactory neurovascular status 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 I made aware that this lesion most likely would recur with debridement. The porokeratosis sub-fifth left MPJ was debrided and packed with salinocaine.  Reappoint when necessary at patient's request

## 2014-12-27 DIAGNOSIS — Z96642 Presence of left artificial hip joint: Secondary | ICD-10-CM | POA: Diagnosis not present

## 2014-12-27 DIAGNOSIS — Z96651 Presence of right artificial knee joint: Secondary | ICD-10-CM | POA: Diagnosis not present

## 2014-12-27 DIAGNOSIS — Z96652 Presence of left artificial knee joint: Secondary | ICD-10-CM | POA: Diagnosis not present

## 2014-12-27 DIAGNOSIS — Z09 Encounter for follow-up examination after completed treatment for conditions other than malignant neoplasm: Secondary | ICD-10-CM | POA: Diagnosis not present

## 2014-12-30 DIAGNOSIS — R2681 Unsteadiness on feet: Secondary | ICD-10-CM | POA: Diagnosis not present

## 2014-12-30 DIAGNOSIS — M6281 Muscle weakness (generalized): Secondary | ICD-10-CM | POA: Diagnosis not present

## 2015-01-03 DIAGNOSIS — M6281 Muscle weakness (generalized): Secondary | ICD-10-CM | POA: Diagnosis not present

## 2015-01-03 DIAGNOSIS — R2681 Unsteadiness on feet: Secondary | ICD-10-CM | POA: Diagnosis not present

## 2015-01-04 DIAGNOSIS — R2681 Unsteadiness on feet: Secondary | ICD-10-CM | POA: Diagnosis not present

## 2015-01-04 DIAGNOSIS — M6281 Muscle weakness (generalized): Secondary | ICD-10-CM | POA: Diagnosis not present

## 2015-01-05 DIAGNOSIS — M6281 Muscle weakness (generalized): Secondary | ICD-10-CM | POA: Diagnosis not present

## 2015-01-05 DIAGNOSIS — R2681 Unsteadiness on feet: Secondary | ICD-10-CM | POA: Diagnosis not present

## 2015-01-09 DIAGNOSIS — M6281 Muscle weakness (generalized): Secondary | ICD-10-CM | POA: Diagnosis not present

## 2015-01-09 DIAGNOSIS — R2681 Unsteadiness on feet: Secondary | ICD-10-CM | POA: Diagnosis not present

## 2015-01-10 DIAGNOSIS — M6281 Muscle weakness (generalized): Secondary | ICD-10-CM | POA: Diagnosis not present

## 2015-01-10 DIAGNOSIS — R2681 Unsteadiness on feet: Secondary | ICD-10-CM | POA: Diagnosis not present

## 2015-01-11 DIAGNOSIS — M6281 Muscle weakness (generalized): Secondary | ICD-10-CM | POA: Diagnosis not present

## 2015-01-11 DIAGNOSIS — R2681 Unsteadiness on feet: Secondary | ICD-10-CM | POA: Diagnosis not present

## 2015-01-12 DIAGNOSIS — Z1389 Encounter for screening for other disorder: Secondary | ICD-10-CM | POA: Diagnosis not present

## 2015-01-12 DIAGNOSIS — E559 Vitamin D deficiency, unspecified: Secondary | ICD-10-CM | POA: Diagnosis not present

## 2015-01-12 DIAGNOSIS — Z79899 Other long term (current) drug therapy: Secondary | ICD-10-CM | POA: Diagnosis not present

## 2015-01-12 DIAGNOSIS — J301 Allergic rhinitis due to pollen: Secondary | ICD-10-CM | POA: Diagnosis not present

## 2015-01-12 DIAGNOSIS — E782 Mixed hyperlipidemia: Secondary | ICD-10-CM | POA: Diagnosis not present

## 2015-01-12 DIAGNOSIS — I10 Essential (primary) hypertension: Secondary | ICD-10-CM | POA: Diagnosis not present

## 2015-01-12 DIAGNOSIS — R079 Chest pain, unspecified: Secondary | ICD-10-CM | POA: Diagnosis not present

## 2015-01-12 DIAGNOSIS — M179 Osteoarthritis of knee, unspecified: Secondary | ICD-10-CM | POA: Diagnosis not present

## 2015-01-12 DIAGNOSIS — K210 Gastro-esophageal reflux disease with esophagitis: Secondary | ICD-10-CM | POA: Diagnosis not present

## 2015-01-12 DIAGNOSIS — Z0001 Encounter for general adult medical examination with abnormal findings: Secondary | ICD-10-CM | POA: Diagnosis not present

## 2015-01-12 DIAGNOSIS — F329 Major depressive disorder, single episode, unspecified: Secondary | ICD-10-CM | POA: Diagnosis not present

## 2015-01-17 DIAGNOSIS — R2681 Unsteadiness on feet: Secondary | ICD-10-CM | POA: Diagnosis not present

## 2015-01-17 DIAGNOSIS — M6281 Muscle weakness (generalized): Secondary | ICD-10-CM | POA: Diagnosis not present

## 2015-01-18 DIAGNOSIS — R2681 Unsteadiness on feet: Secondary | ICD-10-CM | POA: Diagnosis not present

## 2015-01-18 DIAGNOSIS — M6281 Muscle weakness (generalized): Secondary | ICD-10-CM | POA: Diagnosis not present

## 2015-01-20 DIAGNOSIS — R2681 Unsteadiness on feet: Secondary | ICD-10-CM | POA: Diagnosis not present

## 2015-01-20 DIAGNOSIS — M6281 Muscle weakness (generalized): Secondary | ICD-10-CM | POA: Diagnosis not present

## 2015-01-24 DIAGNOSIS — G548 Other nerve root and plexus disorders: Secondary | ICD-10-CM | POA: Diagnosis not present

## 2015-01-24 DIAGNOSIS — D509 Iron deficiency anemia, unspecified: Secondary | ICD-10-CM | POA: Diagnosis not present

## 2015-01-24 DIAGNOSIS — F329 Major depressive disorder, single episode, unspecified: Secondary | ICD-10-CM | POA: Diagnosis not present

## 2015-01-24 DIAGNOSIS — E785 Hyperlipidemia, unspecified: Secondary | ICD-10-CM | POA: Diagnosis not present

## 2015-01-24 DIAGNOSIS — F419 Anxiety disorder, unspecified: Secondary | ICD-10-CM | POA: Diagnosis not present

## 2015-01-24 DIAGNOSIS — M6281 Muscle weakness (generalized): Secondary | ICD-10-CM | POA: Diagnosis not present

## 2015-01-24 DIAGNOSIS — R2681 Unsteadiness on feet: Secondary | ICD-10-CM | POA: Diagnosis not present

## 2015-01-25 DIAGNOSIS — G548 Other nerve root and plexus disorders: Secondary | ICD-10-CM | POA: Diagnosis not present

## 2015-01-25 DIAGNOSIS — M6281 Muscle weakness (generalized): Secondary | ICD-10-CM | POA: Diagnosis not present

## 2015-01-25 DIAGNOSIS — F329 Major depressive disorder, single episode, unspecified: Secondary | ICD-10-CM | POA: Diagnosis not present

## 2015-01-25 DIAGNOSIS — D509 Iron deficiency anemia, unspecified: Secondary | ICD-10-CM | POA: Diagnosis not present

## 2015-01-25 DIAGNOSIS — E785 Hyperlipidemia, unspecified: Secondary | ICD-10-CM | POA: Diagnosis not present

## 2015-01-25 DIAGNOSIS — R2681 Unsteadiness on feet: Secondary | ICD-10-CM | POA: Diagnosis not present

## 2015-01-27 DIAGNOSIS — D509 Iron deficiency anemia, unspecified: Secondary | ICD-10-CM | POA: Diagnosis not present

## 2015-01-27 DIAGNOSIS — M6281 Muscle weakness (generalized): Secondary | ICD-10-CM | POA: Diagnosis not present

## 2015-01-27 DIAGNOSIS — E785 Hyperlipidemia, unspecified: Secondary | ICD-10-CM | POA: Diagnosis not present

## 2015-01-27 DIAGNOSIS — F329 Major depressive disorder, single episode, unspecified: Secondary | ICD-10-CM | POA: Diagnosis not present

## 2015-01-27 DIAGNOSIS — R2681 Unsteadiness on feet: Secondary | ICD-10-CM | POA: Diagnosis not present

## 2015-01-27 DIAGNOSIS — G548 Other nerve root and plexus disorders: Secondary | ICD-10-CM | POA: Diagnosis not present

## 2015-01-30 DIAGNOSIS — D509 Iron deficiency anemia, unspecified: Secondary | ICD-10-CM | POA: Diagnosis not present

## 2015-01-30 DIAGNOSIS — R2681 Unsteadiness on feet: Secondary | ICD-10-CM | POA: Diagnosis not present

## 2015-01-30 DIAGNOSIS — F329 Major depressive disorder, single episode, unspecified: Secondary | ICD-10-CM | POA: Diagnosis not present

## 2015-01-30 DIAGNOSIS — E785 Hyperlipidemia, unspecified: Secondary | ICD-10-CM | POA: Diagnosis not present

## 2015-01-30 DIAGNOSIS — G548 Other nerve root and plexus disorders: Secondary | ICD-10-CM | POA: Diagnosis not present

## 2015-01-30 DIAGNOSIS — M6281 Muscle weakness (generalized): Secondary | ICD-10-CM | POA: Diagnosis not present

## 2015-02-01 DIAGNOSIS — G548 Other nerve root and plexus disorders: Secondary | ICD-10-CM | POA: Diagnosis not present

## 2015-02-01 DIAGNOSIS — M6281 Muscle weakness (generalized): Secondary | ICD-10-CM | POA: Diagnosis not present

## 2015-02-01 DIAGNOSIS — E785 Hyperlipidemia, unspecified: Secondary | ICD-10-CM | POA: Diagnosis not present

## 2015-02-01 DIAGNOSIS — F329 Major depressive disorder, single episode, unspecified: Secondary | ICD-10-CM | POA: Diagnosis not present

## 2015-02-01 DIAGNOSIS — R2681 Unsteadiness on feet: Secondary | ICD-10-CM | POA: Diagnosis not present

## 2015-02-01 DIAGNOSIS — D509 Iron deficiency anemia, unspecified: Secondary | ICD-10-CM | POA: Diagnosis not present

## 2015-02-03 DIAGNOSIS — M6281 Muscle weakness (generalized): Secondary | ICD-10-CM | POA: Diagnosis not present

## 2015-02-03 DIAGNOSIS — E785 Hyperlipidemia, unspecified: Secondary | ICD-10-CM | POA: Diagnosis not present

## 2015-02-03 DIAGNOSIS — D509 Iron deficiency anemia, unspecified: Secondary | ICD-10-CM | POA: Diagnosis not present

## 2015-02-03 DIAGNOSIS — F329 Major depressive disorder, single episode, unspecified: Secondary | ICD-10-CM | POA: Diagnosis not present

## 2015-02-03 DIAGNOSIS — R2681 Unsteadiness on feet: Secondary | ICD-10-CM | POA: Diagnosis not present

## 2015-02-03 DIAGNOSIS — G548 Other nerve root and plexus disorders: Secondary | ICD-10-CM | POA: Diagnosis not present

## 2015-02-06 DIAGNOSIS — F329 Major depressive disorder, single episode, unspecified: Secondary | ICD-10-CM | POA: Diagnosis not present

## 2015-02-06 DIAGNOSIS — G548 Other nerve root and plexus disorders: Secondary | ICD-10-CM | POA: Diagnosis not present

## 2015-02-06 DIAGNOSIS — D509 Iron deficiency anemia, unspecified: Secondary | ICD-10-CM | POA: Diagnosis not present

## 2015-02-06 DIAGNOSIS — E785 Hyperlipidemia, unspecified: Secondary | ICD-10-CM | POA: Diagnosis not present

## 2015-02-06 DIAGNOSIS — M6281 Muscle weakness (generalized): Secondary | ICD-10-CM | POA: Diagnosis not present

## 2015-02-06 DIAGNOSIS — R2681 Unsteadiness on feet: Secondary | ICD-10-CM | POA: Diagnosis not present

## 2015-02-07 DIAGNOSIS — M6281 Muscle weakness (generalized): Secondary | ICD-10-CM | POA: Diagnosis not present

## 2015-02-07 DIAGNOSIS — E785 Hyperlipidemia, unspecified: Secondary | ICD-10-CM | POA: Diagnosis not present

## 2015-02-07 DIAGNOSIS — F329 Major depressive disorder, single episode, unspecified: Secondary | ICD-10-CM | POA: Diagnosis not present

## 2015-02-07 DIAGNOSIS — D509 Iron deficiency anemia, unspecified: Secondary | ICD-10-CM | POA: Diagnosis not present

## 2015-02-07 DIAGNOSIS — R2681 Unsteadiness on feet: Secondary | ICD-10-CM | POA: Diagnosis not present

## 2015-02-07 DIAGNOSIS — G548 Other nerve root and plexus disorders: Secondary | ICD-10-CM | POA: Diagnosis not present

## 2015-02-09 DIAGNOSIS — G548 Other nerve root and plexus disorders: Secondary | ICD-10-CM | POA: Diagnosis not present

## 2015-02-09 DIAGNOSIS — M6281 Muscle weakness (generalized): Secondary | ICD-10-CM | POA: Diagnosis not present

## 2015-02-09 DIAGNOSIS — D509 Iron deficiency anemia, unspecified: Secondary | ICD-10-CM | POA: Diagnosis not present

## 2015-02-09 DIAGNOSIS — F329 Major depressive disorder, single episode, unspecified: Secondary | ICD-10-CM | POA: Diagnosis not present

## 2015-02-09 DIAGNOSIS — E785 Hyperlipidemia, unspecified: Secondary | ICD-10-CM | POA: Diagnosis not present

## 2015-02-09 DIAGNOSIS — R2681 Unsteadiness on feet: Secondary | ICD-10-CM | POA: Diagnosis not present

## 2015-02-13 DIAGNOSIS — G548 Other nerve root and plexus disorders: Secondary | ICD-10-CM | POA: Diagnosis not present

## 2015-02-13 DIAGNOSIS — F329 Major depressive disorder, single episode, unspecified: Secondary | ICD-10-CM | POA: Diagnosis not present

## 2015-02-13 DIAGNOSIS — M6281 Muscle weakness (generalized): Secondary | ICD-10-CM | POA: Diagnosis not present

## 2015-02-13 DIAGNOSIS — R2681 Unsteadiness on feet: Secondary | ICD-10-CM | POA: Diagnosis not present

## 2015-02-13 DIAGNOSIS — D509 Iron deficiency anemia, unspecified: Secondary | ICD-10-CM | POA: Diagnosis not present

## 2015-02-13 DIAGNOSIS — E785 Hyperlipidemia, unspecified: Secondary | ICD-10-CM | POA: Diagnosis not present

## 2015-02-15 DIAGNOSIS — G548 Other nerve root and plexus disorders: Secondary | ICD-10-CM | POA: Diagnosis not present

## 2015-02-15 DIAGNOSIS — M6281 Muscle weakness (generalized): Secondary | ICD-10-CM | POA: Diagnosis not present

## 2015-02-15 DIAGNOSIS — E785 Hyperlipidemia, unspecified: Secondary | ICD-10-CM | POA: Diagnosis not present

## 2015-02-15 DIAGNOSIS — R2681 Unsteadiness on feet: Secondary | ICD-10-CM | POA: Diagnosis not present

## 2015-02-15 DIAGNOSIS — D509 Iron deficiency anemia, unspecified: Secondary | ICD-10-CM | POA: Diagnosis not present

## 2015-02-15 DIAGNOSIS — F329 Major depressive disorder, single episode, unspecified: Secondary | ICD-10-CM | POA: Diagnosis not present

## 2015-02-17 DIAGNOSIS — M6281 Muscle weakness (generalized): Secondary | ICD-10-CM | POA: Diagnosis not present

## 2015-02-17 DIAGNOSIS — D509 Iron deficiency anemia, unspecified: Secondary | ICD-10-CM | POA: Diagnosis not present

## 2015-02-17 DIAGNOSIS — F329 Major depressive disorder, single episode, unspecified: Secondary | ICD-10-CM | POA: Diagnosis not present

## 2015-02-17 DIAGNOSIS — G548 Other nerve root and plexus disorders: Secondary | ICD-10-CM | POA: Diagnosis not present

## 2015-02-17 DIAGNOSIS — E785 Hyperlipidemia, unspecified: Secondary | ICD-10-CM | POA: Diagnosis not present

## 2015-02-17 DIAGNOSIS — R2681 Unsteadiness on feet: Secondary | ICD-10-CM | POA: Diagnosis not present

## 2015-02-20 DIAGNOSIS — G548 Other nerve root and plexus disorders: Secondary | ICD-10-CM | POA: Diagnosis not present

## 2015-02-20 DIAGNOSIS — M6281 Muscle weakness (generalized): Secondary | ICD-10-CM | POA: Diagnosis not present

## 2015-02-20 DIAGNOSIS — D509 Iron deficiency anemia, unspecified: Secondary | ICD-10-CM | POA: Diagnosis not present

## 2015-02-20 DIAGNOSIS — E785 Hyperlipidemia, unspecified: Secondary | ICD-10-CM | POA: Diagnosis not present

## 2015-02-20 DIAGNOSIS — R2681 Unsteadiness on feet: Secondary | ICD-10-CM | POA: Diagnosis not present

## 2015-02-20 DIAGNOSIS — F329 Major depressive disorder, single episode, unspecified: Secondary | ICD-10-CM | POA: Diagnosis not present

## 2015-02-22 DIAGNOSIS — E785 Hyperlipidemia, unspecified: Secondary | ICD-10-CM | POA: Diagnosis not present

## 2015-02-22 DIAGNOSIS — R2681 Unsteadiness on feet: Secondary | ICD-10-CM | POA: Diagnosis not present

## 2015-02-22 DIAGNOSIS — M6281 Muscle weakness (generalized): Secondary | ICD-10-CM | POA: Diagnosis not present

## 2015-02-22 DIAGNOSIS — D509 Iron deficiency anemia, unspecified: Secondary | ICD-10-CM | POA: Diagnosis not present

## 2015-02-22 DIAGNOSIS — F329 Major depressive disorder, single episode, unspecified: Secondary | ICD-10-CM | POA: Diagnosis not present

## 2015-02-22 DIAGNOSIS — G548 Other nerve root and plexus disorders: Secondary | ICD-10-CM | POA: Diagnosis not present

## 2015-02-23 DIAGNOSIS — M79651 Pain in right thigh: Secondary | ICD-10-CM | POA: Diagnosis not present

## 2015-03-09 DIAGNOSIS — M7061 Trochanteric bursitis, right hip: Secondary | ICD-10-CM | POA: Diagnosis not present

## 2015-03-14 DIAGNOSIS — M7061 Trochanteric bursitis, right hip: Secondary | ICD-10-CM | POA: Diagnosis not present

## 2015-03-15 ENCOUNTER — Emergency Department (HOSPITAL_COMMUNITY)
Admission: EM | Admit: 2015-03-15 | Discharge: 2015-03-16 | Disposition: A | Discharge status: Home / Self Care | Payer: Medicare Other | Attending: Emergency Medicine | Admitting: Emergency Medicine

## 2015-03-15 ENCOUNTER — Encounter (HOSPITAL_COMMUNITY): Payer: Self-pay | Admitting: Emergency Medicine

## 2015-03-15 DIAGNOSIS — I10 Essential (primary) hypertension: Secondary | ICD-10-CM | POA: Diagnosis not present

## 2015-03-15 DIAGNOSIS — F329 Major depressive disorder, single episode, unspecified: Secondary | ICD-10-CM | POA: Insufficient documentation

## 2015-03-15 DIAGNOSIS — K219 Gastro-esophageal reflux disease without esophagitis: Secondary | ICD-10-CM | POA: Diagnosis not present

## 2015-03-15 DIAGNOSIS — Z79899 Other long term (current) drug therapy: Secondary | ICD-10-CM | POA: Diagnosis not present

## 2015-03-15 DIAGNOSIS — D649 Anemia, unspecified: Secondary | ICD-10-CM | POA: Insufficient documentation

## 2015-03-15 DIAGNOSIS — K5900 Constipation, unspecified: Secondary | ICD-10-CM | POA: Diagnosis present

## 2015-03-15 DIAGNOSIS — Z7982 Long term (current) use of aspirin: Secondary | ICD-10-CM | POA: Diagnosis not present

## 2015-03-15 DIAGNOSIS — R109 Unspecified abdominal pain: Secondary | ICD-10-CM

## 2015-03-15 DIAGNOSIS — K5909 Other constipation: Secondary | ICD-10-CM | POA: Diagnosis not present

## 2015-03-15 DIAGNOSIS — Z8619 Personal history of other infectious and parasitic diseases: Secondary | ICD-10-CM | POA: Diagnosis not present

## 2015-03-15 DIAGNOSIS — M170 Bilateral primary osteoarthritis of knee: Secondary | ICD-10-CM | POA: Diagnosis not present

## 2015-03-15 DIAGNOSIS — K449 Diaphragmatic hernia without obstruction or gangrene: Secondary | ICD-10-CM | POA: Diagnosis not present

## 2015-03-15 DIAGNOSIS — E780 Pure hypercholesterolemia: Secondary | ICD-10-CM | POA: Insufficient documentation

## 2015-03-15 DIAGNOSIS — Z87891 Personal history of nicotine dependence: Secondary | ICD-10-CM | POA: Insufficient documentation

## 2015-03-15 DIAGNOSIS — K5904 Chronic idiopathic constipation: Secondary | ICD-10-CM

## 2015-03-15 DIAGNOSIS — R1084 Generalized abdominal pain: Secondary | ICD-10-CM | POA: Diagnosis not present

## 2015-03-15 NOTE — ED Notes (Addendum)
Pt from [place] via [place] for constipation. Reports no BM x3 days. No relief at home with fleets enema, MOM, and prune juice. Lower abdominal pain radiating to back with 5/10 pain.   Last VS 158/70, 98%ra.

## 2015-03-15 NOTE — ED Provider Notes (Signed)
CSN: [id number]     Arrival date & time 03/15/15  2147 History  This chart was scribed for Delora Fuel, MD by Randa Evens, ED Scribe. This patient was seen in room WA08/WA08 and the patient's care was started at 11:17 PM.     Chief Complaint  Patient presents with  . Constipation   The history is provided by the patient. No language interpreter was used.   HPI Comments: Kristin Solomon is a 81 y.o. female who presents to the Emergency Department complaining of constipation onset 3 days prior. Pt states she is having associated 5 out 10 low abdominal pain that began yesterday. Pt states she has not had a BM in 3 days. Pt states she has tried fleet enemas with no relief. Pt states she has tried several laxatives with no relief as well. She reports that she is producing little flatus is any at all. Denies nausea, fever, chills or other related symptoms. Pt reports recently going to her orthopedic and being started on a new medication for tendonitis in her right hip. Pt denies any recent prescription pain medication.   Past Medical History  Diagnosis Date  . Herpes ocular     history of  . GERD (gastroesophageal reflux disease)   . Depression   . High cholesterol   . Hypertension     under control; has been on med. x 10 yr.  . History of hiatal hernia   . Osteoarthritis of knee     bilateral  . Anemia, iron deficiency   . Dental crowns present   . Fracture of femoral neck, left 06/12/2013  . Stenosing tenosynovitis of finger 08/2012    right index finger  . Sciatic pain   . Gait disorder 04/21/2014   Past Surgical History  Procedure Laterality Date  . Lumbar laminectomy/decompression microdiscectomy  08/29/2010    L2-S1  . Shoulder arthroscopy with rotator cuff repair and subacromial decompression Right 01/01/2000  . Total knee arthroplasty Left 05/14/2004  . Abdominal hysterectomy  1995    partial  . Laparoscopic nissen fundoplication  02/15/538  . Descemets stripping  automated endothelial keratoplasty Left 03/14/2011  . Tonsillectomy      as child  . Colonoscopy    . Total knee arthroplasty  12/23/2011    Procedure: TOTAL KNEE ARTHROPLASTY;  Surgeon: Lorn Junes, MD;  Location: [place];  Service: Orthopedics;  Laterality: Right;  DR Moodus THIS CASE  . Knee arthroscopy Right 05/11/2001  . Trigger finger release Right 07/21/2007    thumb  . Carpal tunnel release Right 07/21/2007  . Trigger finger release Left 09/24/2007    middle finger  . Carpal tunnel release Left 09/24/2007  . Trigger finger release Right 03/10/2008    ring and little fingers  . Descemets stripping automated endothelial keratoplasty Right 08/20/2012  . Trigger finger release Right 09/02/2012    Procedure: RELEASE TRIGGER FINGER/A-1 PULLEY RIGHT INDEX FINGER;  Surgeon: Wynonia Sours, MD;  Location: [place];  Service: Orthopedics;  Laterality: Right;  . Trigger finger release Left 12/22/2012    Procedure: RELEASE TRIGGER FINGER/A-1 PULLEY LEFT RING FINGER;  Surgeon: Wynonia Sours, MD;  Location: [place];  Service: Orthopedics;  Laterality: Left;  Left   . Joint replacement      bilateral knees, left knee  . Hip arthroplasty Left 06/13/2013    Procedure: ARTHROPLASTY BIPOLAR HIP; Injection left shoulder;  Surgeon: Johnny Bridge, MD;  Location:  [facility];  Service: Orthopedics;  Laterality: Left;  . Hernia repair    . Eye surgery Bilateral   . Total hip revision Left 07/08/2014    Procedure: TOTAL HIP REVISION;  Surgeon: Kerin Salen, MD;  Location: [place];  Service: Orthopedics;  Laterality: Left;   Family History  Problem Relation Age of Onset  . Heart attack Mother   . Parkinsonism Father   . Diabetes Brother    Social History  Substance Use Topics  . Smoking status: Former Research scientist (life sciences)  . Smokeless tobacco: Never Used     Comment: quit smoking 20-30 yr. ago  . Alcohol Use: No   OB History    No data available     Review of Systems   Constitutional: Negative for fever and chills.  Gastrointestinal: Positive for abdominal pain and constipation. Negative for nausea and vomiting.  All other systems reviewed and are negative.    Allergies  Morphine and related; Scopace; Lipitor; Nsaids; and Pravachol  Home Medications   Prior to Admission medications   Medication Sig Start Date End Date Taking? Authorizing Provider  acetaminophen (TYLENOL) 500 MG tablet Take 500 mg by mouth every 6 (six) hours as needed for pain.   Yes Historical Provider, MD  acyclovir (ZOVIRAX) 400 MG tablet Take 800 mg by mouth daily.  03/30/14  Yes Historical Provider, MD  ALPRAZolam Duanne Moron) 0.5 MG tablet Take 0.25 mg by mouth at bedtime as needed for sleep.   Yes Historical Provider, MD  aspirin EC 81 MG tablet Take 81 mg by mouth daily.   Yes Historical Provider, MD  atorvastatin (LIPITOR) 20 MG tablet Take 20 mg by mouth daily.   Yes Historical Provider, MD  cholecalciferol (VITAMIN D) 1000 UNITS tablet Take 2,000 Units by mouth daily.   Yes Historical Provider, MD  docusate sodium (COLACE) 100 MG capsule Take 1 capsule by mouth. 06/15/13  Yes Historical Provider, MD  ferrous sulfate 325 (65 FE) MG tablet Take 325 mg by mouth daily.   Yes Historical Provider, MD  magic mouthwash SOLN Take 5 mLs by mouth 4 (four) times daily.   Yes Historical Provider, MD  metoprolol (LOPRESSOR) 50 MG tablet Take 25-50 mg by mouth 2 (two) times daily. Take 50 MG in the morning and take 25 MG at bedtime.   Yes Historical Provider, MD  omeprazole (PRILOSEC) 40 MG capsule Take 40 mg by mouth 2 (two) times daily.    Yes Historical Provider, MD  polyethylene glycol (MIRALAX / GLYCOLAX) packet Take 17 g by mouth once.   Yes Historical Provider, MD  sennosides-docusate sodium (SENOKOT-S) 8.6-50 MG tablet Take 2 tablets by mouth daily. Patient taking differently: Take 2 tablets by mouth daily as needed.  06/13/13  Yes Marchia Bond, MD  venlafaxine XR (EFFEXOR-XR) 75 MG 24  hr capsule Take 150 mg by mouth at bedtime.    Yes Historical Provider, MD  alum & mag hydroxide-simeth (MAALOX/MYLANTA) 200-200-20 MG/5ML suspension Take 30 mLs by mouth every 4 (four) hours as needed for indigestion. Patient not taking: Reported on 03/15/2015 06/15/13   Josetta Huddle, MD  aspirin EC 325 MG tablet Take 1 tablet (325 mg total) by mouth 2 (two) times daily. Patient not taking: Reported on 03/15/2015 07/08/14   Leighton Parody, PA-C  docusate sodium 100 MG CAPS Take 100 mg by mouth 2 (two) times daily. Patient not taking: Reported on 03/15/2015 06/15/13   Josetta Huddle, MD  menthol-cetylpyridinium (CEPACOL) 3 MG lozenge Take 1 lozenge (3  mg total) by mouth as needed for sore throat (sore throat). Patient not taking: Reported on 03/15/2015 06/15/13   Josetta Huddle, MD  oxyCODONE-acetaminophen (ROXICET) 5-325 MG per tablet Take 1 tablet by mouth every 4 (four) hours as needed. Patient not taking: Reported on 03/15/2015 07/08/14   Leighton Parody, PA-C  tizanidine (ZANAFLEX) 2 MG capsule Take 1 capsule (2 mg total) by mouth 3 (three) times daily. Patient not taking: Reported on 03/15/2015 07/08/14   Leighton Parody, PA-C   BP 150/70 mmHg  Pulse 74  Temp(Src) 98.5 F (36.9 C) (Oral)  Resp 18  SpO2 95%   Physical Exam  Constitutional: She is oriented to person, place, and time. She appears well-developed and well-nourished. No distress.  HENT:  Head: Normocephalic and atraumatic.  Eyes: Conjunctivae and EOM are normal. Pupils are equal, round, and reactive to light.  Neck: Normal range of motion. Neck supple. No JVD present.  Cardiovascular: Normal rate, regular rhythm and normal heart sounds.   No murmur heard. Pulmonary/Chest: Effort normal and breath sounds normal. She has no wheezes. She has no rales. She exhibits no tenderness.  Abdominal: Soft. She exhibits no mass. Bowel sounds are decreased. There is no rebound and no guarding.  Mild tenderness across the upper abdomen. Moderate  tenderness across the lower abdomen.   Genitourinary:  decreased sphincter tone. No stool impacted.   Musculoskeletal: Normal range of motion. She exhibits no edema.  Lymphadenopathy:    She has no cervical adenopathy.  Neurological: She is alert and oriented to person, place, and time. No cranial nerve deficit. She exhibits normal muscle tone. Coordination normal.  Skin: Skin is warm and dry. No rash noted.  Psychiatric: She has a normal mood and affect. Her behavior is normal. Judgment and thought content normal.  Nursing note and vitals reviewed.   ED Course  Procedures (including critical care time) DIAGNOSTIC STUDIES: Oxygen Saturation is 95% on RA, normal by my interpretation.    COORDINATION OF CARE: 11:36 PM-Discussed treatment plan with pt at bedside and pt agreed to plan.     Labs Review Results for orders placed or performed during the hospital encounter of 03/15/15  Comprehensive metabolic panel  Result Value Ref Range   Sodium 133 (L) 135 - 145 mmol/L   Potassium 4.5 3.5 - 5.1 mmol/L   Chloride 100 (L) 101 - 111 mmol/L   CO2 26 22 - 32 mmol/L   Glucose, Bld 133 (H) 65 - 99 mg/dL   BUN 17 6 - 20 mg/dL   Creatinine, Ser 0.69 0.44 - 1.00 mg/dL   Calcium 9.3 8.9 - 10.3 mg/dL   Total Protein 8.1 6.5 - 8.1 g/dL   Albumin 4.5 3.5 - 5.0 g/dL   AST 30 15 - 41 U/L   ALT 25 14 - 54 U/L   Alkaline Phosphatase 84 38 - 126 U/L   Total Bilirubin 0.3 0.3 - 1.2 mg/dL   GFR calc non Af Amer >60 >60 mL/min   GFR calc Af Amer >60 >60 mL/min   Anion gap 7 5 - 15  CBC with Differential  Result Value Ref Range   WBC 14.9 (H) 4.0 - 10.5 K/uL   RBC 3.77 (L) 3.87 - 5.11 MIL/uL   Hemoglobin 12.4 12.0 - 15.0 g/dL   HCT 35.3 (L) 36.0 - 46.0 %   MCV 93.6 78.0 - 100.0 fL   MCH 32.9 26.0 - 34.0 pg   MCHC 35.1 30.0 - 36.0 g/dL   RDW  12.4 11.5 - 15.5 %   Platelets 308 150 - 400 K/uL   Neutrophils Relative % 75 %   Neutro Abs 11.3 (H) 1.7 - 7.7 K/uL   Lymphocytes Relative 15 %    Lymphs Abs 2.2 0.7 - 4.0 K/uL   Monocytes Relative 10 %   Monocytes Absolute 1.4 (H) 0.1 - 1.0 K/uL   Eosinophils Relative 0 %   Eosinophils Absolute 0.0 0.0 - 0.7 K/uL   Basophils Relative 0 %   Basophils Absolute 0.0 0.0 - 0.1 K/uL  Lipase, blood  Result Value Ref Range   Lipase 19 (L) 22 - 51 U/L  Urinalysis, Routine w reflex microscopic  Result Value Ref Range   Color, Urine YELLOW YELLOW   APPearance CLOUDY (A) CLEAR   Specific Gravity, Urine 1.008 1.005 - 1.030   pH 7.5 5.0 - 8.0   Glucose, UA NEGATIVE NEGATIVE mg/dL   Hgb urine dipstick NEGATIVE NEGATIVE   Bilirubin Urine NEGATIVE NEGATIVE   Ketones, ur NEGATIVE NEGATIVE mg/dL   Protein, ur NEGATIVE NEGATIVE mg/dL   Urobilinogen, UA 0.2 0.0 - 1.0 mg/dL   Nitrite NEGATIVE NEGATIVE   Leukocytes, UA MODERATE (A) NEGATIVE  Urine microscopic-add on  Result Value Ref Range   Squamous Epithelial / LPF RARE RARE   WBC, UA 11-20 <3 WBC/hpf   Bacteria, UA RARE RARE    Imaging Review Ct Abdomen Pelvis W Contrast  03/16/2015   CLINICAL DATA:  Constipation for 3 days, abdominal pain for 2 days, leukocytosis, hypertension, hiatal hernia, GERD, post laparoscopic Nissen fundoplication, hysterectomy  EXAM: CT ABDOMEN AND PELVIS WITH CONTRAST  TECHNIQUE: Multidetector CT imaging of the abdomen and pelvis was performed using the standard protocol following bolus administration of intravenous contrast. Sagittal and coronal MPR images reconstructed from axial data set.  CONTRAST:  121mL OMNIPAQUE IOHEXOL 300 MG/ML SOLN IV. Dilute oral contrast.  COMPARISON:  None  FINDINGS: Lung bases clear.  Atherosclerotic calcifications aorta.  Tiny myelolipoma LEFT adrenal gland 7 mm diameter.  Liver, gallbladder, spleen, pancreas, kidneys, and adrenal glands otherwise normal.  Uterus surgically absent with nonvisualization of ovaries and appendix.  Prominent stool in colon.  Descending and sigmoid colonic diverticulosis without gross evidence of  diverticulitis.  Portions of pelvis obscured by beam hardening artifacts from LEFT hip prosthesis.  Small bowel loops unremarkable.  Postsurgical changes at gastric cardia/fundus without recurrent hiatal hernia.  Visualize bladder and ureters unremarkable.  BILATERAL inguinal hernias containing fat.  No mass, adenopathy, free air or free fluid.  Bones demineralized with degenerative disc and facet disease changes lumbar spine.  IMPRESSION: Increased stool in colon.  Distal colonic diverticulosis without definite evidence of acute diverticulitis.  Tiny LEFT adrenal myelolipoma.  No definite acute intra-abdominal or intrapelvic abnormalities.   Electronically Signed   By: Lavonia Dana M.D.   On: 03/16/2015 02:04     MDM   Final diagnoses:  Abdominal pain, unspecified abdominal location  Constipation - functional     Constipation, but diffuse abdominal tenderness makes me suspicious that this is more than just simple functional constipation. Also, no stool present on rectal exam. I am concerned about possibility of diverticulitis. She will be sent for CT of abdomen and pelvis. Old records were reviewed, and she has no relevant past visits.   CT shows no acute process but not large stool burden consistent with patient's history. She is discharged with prescription for polyethylene glycol.  I personally performed the services described in this documentation, which was scribed  in my presence. The recorded information has been reviewed and is accurate.       Delora Fuel, MD 94/80/16 [id number]

## 2015-03-15 NOTE — ED Notes (Signed)
Pt states she has not been able to have a BM in 3 days  Pt states she has tried MOM, prune juice, and fleet enema with oil  Pt states she only had the fluid on return with a couple pieces of fecal matter noted  Pt is c/o abd discomfort

## 2015-03-16 ENCOUNTER — Emergency Department (HOSPITAL_COMMUNITY): Payer: Medicare Other

## 2015-03-16 ENCOUNTER — Encounter (HOSPITAL_COMMUNITY): Payer: Self-pay

## 2015-03-16 DIAGNOSIS — K5909 Other constipation: Secondary | ICD-10-CM | POA: Diagnosis not present

## 2015-03-16 DIAGNOSIS — K5900 Constipation, unspecified: Secondary | ICD-10-CM | POA: Diagnosis not present

## 2015-03-16 DIAGNOSIS — K449 Diaphragmatic hernia without obstruction or gangrene: Secondary | ICD-10-CM | POA: Diagnosis not present

## 2015-03-16 DIAGNOSIS — R109 Unspecified abdominal pain: Secondary | ICD-10-CM | POA: Diagnosis not present

## 2015-03-16 LAB — URINALYSIS, ROUTINE W REFLEX MICROSCOPIC
Bilirubin Urine: NEGATIVE
GLUCOSE, UA: NEGATIVE mg/dL
HGB URINE DIPSTICK: NEGATIVE
KETONES UR: NEGATIVE mg/dL
Nitrite: NEGATIVE
PROTEIN: NEGATIVE mg/dL
Specific Gravity, Urine: 1.008 (ref 1.005–1.030)
UROBILINOGEN UA: 0.2 mg/dL (ref 0.0–1.0)
pH: 7.5 (ref 5.0–8.0)

## 2015-03-16 LAB — CBC WITH DIFFERENTIAL/PLATELET
BASOS ABS: 0 10*3/uL (ref 0.0–0.1)
BASOS PCT: 0 %
EOS ABS: 0 10*3/uL (ref 0.0–0.7)
Eosinophils Relative: 0 %
HEMATOCRIT: 35.3 % — AB (ref 36.0–46.0)
HEMOGLOBIN: 12.4 g/dL (ref 12.0–15.0)
Lymphocytes Relative: 15 %
Lymphs Abs: 2.2 10*3/uL (ref 0.7–4.0)
MCH: 32.9 pg (ref 26.0–34.0)
MCHC: 35.1 g/dL (ref 30.0–36.0)
MCV: 93.6 fL (ref 78.0–100.0)
MONOS PCT: 10 %
Monocytes Absolute: 1.4 10*3/uL — ABNORMAL HIGH (ref 0.1–1.0)
NEUTROS ABS: 11.3 10*3/uL — AB (ref 1.7–7.7)
NEUTROS PCT: 75 %
Platelets: 308 10*3/uL (ref 150–400)
RBC: 3.77 MIL/uL — ABNORMAL LOW (ref 3.87–5.11)
RDW: 12.4 % (ref 11.5–15.5)
WBC: 14.9 10*3/uL — AB (ref 4.0–10.5)

## 2015-03-16 LAB — COMPREHENSIVE METABOLIC PANEL
ALT: 25 U/L (ref 14–54)
ANION GAP: 7 (ref 5–15)
AST: 30 U/L (ref 15–41)
Albumin: 4.5 g/dL (ref 3.5–5.0)
Alkaline Phosphatase: 84 U/L (ref 38–126)
BILIRUBIN TOTAL: 0.3 mg/dL (ref 0.3–1.2)
BUN: 17 mg/dL (ref 6–20)
CO2: 26 mmol/L (ref 22–32)
Calcium: 9.3 mg/dL (ref 8.9–10.3)
Chloride: 100 mmol/L — ABNORMAL LOW (ref 101–111)
Creatinine, Ser: 0.69 mg/dL (ref 0.44–1.00)
GFR calc Af Amer: >60 mL/min (ref >60)
GFR calc non Af Amer: >60 mL/min (ref >60)
Glucose, Bld: 133 mg/dL — ABNORMAL HIGH (ref 65–99)
POTASSIUM: 4.5 mmol/L (ref 3.5–5.1)
Sodium: 133 mmol/L — ABNORMAL LOW (ref 135–145)
TOTAL PROTEIN: 8.1 g/dL (ref 6.5–8.1)

## 2015-03-16 LAB — URINE MICROSCOPIC-ADD ON

## 2015-03-16 LAB — LIPASE, BLOOD: LIPASE: 19 U/L — AB (ref 22–51)

## 2015-03-16 MED ORDER — PEG 3350-KCL-NABCB-NACL-NASULF 236 G PO SOLR: 4.0000 L | Freq: Once | ORAL | Status: DC

## 2015-03-16 MED ORDER — POLYETHYLENE GLYCOL 3350 17 G PO PACK: 17.0000 g | PACK | Freq: Every day | ORAL | Status: DC

## 2015-03-16 MED ORDER — IOHEXOL 300 MG/ML  SOLN
25.0000 mL | Freq: Once | INTRAMUSCULAR | Status: AC | PRN
  Administered 2015-03-16: 25 mL via ORAL

## 2015-03-16 MED ORDER — POLYETHYLENE GLYCOL 3350 17 G PO PACK: 17.0000 g | PACK | Freq: Once | ORAL | Status: DC

## 2015-03-16 MED ORDER — IOHEXOL 300 MG/ML  SOLN
100.0000 mL | Freq: Once | INTRAMUSCULAR | Status: AC | PRN
  Administered 2015-03-16: 100 mL via INTRAVENOUS

## 2015-03-16 NOTE — ED Notes (Signed)
Returned from CT.

## 2015-03-16 NOTE — ED Notes (Signed)
Patient transported to CT 

## 2015-03-16 NOTE — Discharge Instructions (Signed)
Constipation °Constipation is when a person has fewer than three bowel movements a week, has difficulty having a bowel movement, or has stools that are dry, hard, or larger than normal. As people grow older, constipation is more common. If you try to fix constipation with medicines that make you have a bowel movement (laxatives), the problem may get worse. Long-term laxative use may cause the muscles of the colon to become weak. A low-fiber diet, not taking in enough fluids, and taking certain medicines may make constipation worse.  °CAUSES  °· Certain medicines, such as antidepressants, pain medicine, iron supplements, antacids, and water pills.   °· Certain diseases, such as diabetes, irritable bowel syndrome (IBS), thyroid disease, or depression.   °· Not drinking enough water.   °· Not eating enough fiber-rich foods.   °· Stress or travel.   °· Lack of physical activity or exercise.   °· Ignoring the urge to have a bowel movement.   °· Using laxatives too much.   °SIGNS AND SYMPTOMS  °· Having fewer than three bowel movements a week.   °· Straining to have a bowel movement.   °· Having stools that are hard, dry, or larger than normal.   °· Feeling full or bloated.   °· Pain in the lower abdomen.   °· Not feeling relief after having a bowel movement.   °DIAGNOSIS  °Your health care provider will take a medical history and perform a physical exam. Further testing may be done for severe constipation. Some tests may include: °· A barium enema X-ray to examine your rectum, colon, and, sometimes, your small intestine.   °· A sigmoidoscopy to examine your lower colon.   °· A colonoscopy to examine your entire colon. °TREATMENT  °Treatment will depend on the severity of your constipation and what is causing it. Some dietary treatments include drinking more fluids and eating more fiber-rich foods. Lifestyle treatments may include regular exercise. If these diet and lifestyle recommendations do not help, your health care  provider may recommend taking over-the-counter laxative medicines to help you have bowel movements. Prescription medicines may be prescribed if over-the-counter medicines do not work.  °HOME CARE INSTRUCTIONS  °· Eat foods that have a lot of fiber, such as fruits, vegetables, whole grains, and beans. °· Limit foods high in fat and processed sugars, such as french fries, hamburgers, cookies, candies, and soda.   °· A fiber supplement may be added to your diet if you cannot get enough fiber from foods.   °· Drink enough fluids to keep your urine clear or pale yellow.   °· Exercise regularly or as directed by your health care provider.   °· Go to the restroom when you have the urge to go. Do not hold it.   °· Only take over-the-counter or prescription medicines as directed by your health care provider. Do not take other medicines for constipation without talking to your health care provider first.   °SEEK IMMEDIATE MEDICAL CARE IF:  °· You have bright red blood in your stool.   °· Your constipation lasts for more than 4 days or gets worse.   °· You have abdominal or rectal pain.   °· You have thin, pencil-like stools.   °· You have unexplained weight loss. °MAKE SURE YOU:  °· Understand these instructions. °· Will watch your condition. °· Will get help right away if you are not doing well or get worse. °Document Released: 03/08/2004 Document Revised: 06/15/2013 Document Reviewed: 03/22/2013 °[employer]® Patient Information ©2015 [employer]. This information is not intended to replace advice given to you by your health care provider. Make sure you discuss any questions   you have with your health care provider.  Polyethylene Glycol; Electrolytes oral solution What is this medicine? POLYETHYLENE GLYCOL; ELECTROLYTES (pol ee ETH i leen GLYE col; i LEK truh lahyts) is a laxative. It is used to clean out the bowel before a colonoscopy. This medicine may be used for other purposes; ask your health care provider or  pharmacist if you have questions. COMMON BRAND NAME(S): Colyte, GaviLyte-C, GaviLyte-G, GaviLyte-N, GoLYTELY, NuLYTELY, OCL, Suclear, TriLyte What should I tell my health care provider before I take this medicine? They need to know if you have any of these conditions: -any chronic disease of the intestine, stomach, or throat -bloating or pain in stomach area -difficulty swallowing -G6PD deficiency -heart disease -kidney disease -low levels of sodium in the blood -phenylketonuria -seizures -an unusual or allergic reaction to polyethylene glycol, other medicines, dyes, or preservatives -pregnant or trying to get pregnant -breast-feeding How should I use this medicine? Take this medicine by mouth. Before using this medicine you or your pharmacist must fill the container with the amount of water indicated on the package label. Chill solution before using to improve taste. Shake well before each dose. Your doctor will tell you what time to start this medicine. Take exactly as directed. You will usually have the first bowel movement about 1 hour after you begin drinking the solution. After that, you will have frequent watery bowel movements. You will need to follow a special diet before your procedure. Talk to your doctor. Do not eat any solid foods for 3 to 4 hours before taking this medicine. A special MedGuide will be given to you by the pharmacist with each prescription and refill. Be sure to read this information carefully each time. Talk to your pediatrician regarding the use of this medicine in children. Special care may be needed. Overdosage: If you think you have taken too much of this medicine contact a poison control center or emergency room at once. NOTE: This medicine is only for you. Do not share this medicine with others. What if I miss a dose? You should talk to your doctor if you are not able to complete the bowel preparation as advised. What may interact with this medicine? Do  not take any other medicine by mouth starting one hour before you take this medicine. Talk to your doctor about when to take your other medicines. This medicine may interact with the following medications: -certain medicines for blood pressure, heart disease, irregular heart beat -certain medicines for kidney disease -certain medicines for seizures like carbamazepine, phenobarbital, phenytoin -diuretics -laxatives -NSAIDS, medicines for pain and inflammation, like ibuprofen or naproxen This list may not describe all possible interactions. Give your health care provider a list of all the medicines, herbs, non-prescription drugs, or dietary supplements you use. Also tell them if you smoke, drink alcohol, or use illegal drugs. Some items may interact with your medicine. What should I watch for while using this medicine? Tell your doctor if you experience any changes in bowel habits that are severe or last longer than three weeks. Do not inhale dust from the solution powder. This can make breathing difficult or may cause sneezing or an allergic-type reaction. Bloating may occur before the first bowel movement. If your discomfort continues while you are drinking the solution, stop drinking the solution temporarily or drink each portion with longer spaces in between until your symptoms disappear. Contact your doctor or health care professional if you have any concerns. What side effects may I notice from receiving  this medicine? Side effects that you should report to your doctor or health care professional as soon as possible: -allergic reactions like skin rash, itching or hives, swelling of the face, lips, or tongue -breathing problems -chest tightness -dizziness -fast, irregular heartbeat -headache -seizures -trouble passing urine or change in the amount of urine -vomiting Side effects that usually do not require medical attention (report to your doctor or health care professional if they  continue or are bothersome): -anal irritation -bloating, pain, or distension of the stomach -chills -increased hunger or thirst -nausea -stomach gas -tiredness -trouble sleeping This list may not describe all possible side effects. Call your doctor for medical advice about side effects. You may report side effects to FDA at 1-800-FDA-1088. Where should I keep my medicine? Keep out of the reach of children. Store the solution in the refrigerator to improve the taste. Do not freeze. Throw away any unused medicine 48 hours after mixing. NOTE: This sheet is a summary. It may not cover all possible information. If you have questions about this medicine, talk to your doctor, pharmacist, or health care provider.  2015, [employer]. (2009-11-09 12:51:08)

## 2015-03-23 DIAGNOSIS — M7061 Trochanteric bursitis, right hip: Secondary | ICD-10-CM | POA: Diagnosis not present

## 2015-03-23 DIAGNOSIS — M545 Low back pain: Secondary | ICD-10-CM | POA: Diagnosis not present

## 2015-03-23 DIAGNOSIS — Z23 Encounter for immunization: Secondary | ICD-10-CM | POA: Diagnosis not present

## 2015-03-27 DIAGNOSIS — K5900 Constipation, unspecified: Secondary | ICD-10-CM | POA: Diagnosis not present

## 2015-03-31 DIAGNOSIS — I10 Essential (primary) hypertension: Secondary | ICD-10-CM | POA: Diagnosis not present

## 2015-03-31 DIAGNOSIS — S22080A Wedge compression fracture of T11-T12 vertebra, initial encounter for closed fracture: Secondary | ICD-10-CM | POA: Diagnosis not present

## 2015-03-31 DIAGNOSIS — M545 Low back pain: Secondary | ICD-10-CM | POA: Diagnosis not present

## 2015-03-31 DIAGNOSIS — M5416 Radiculopathy, lumbar region: Secondary | ICD-10-CM | POA: Diagnosis not present

## 2015-04-04 DIAGNOSIS — S22080A Wedge compression fracture of T11-T12 vertebra, initial encounter for closed fracture: Secondary | ICD-10-CM | POA: Diagnosis not present

## 2015-04-05 DIAGNOSIS — Z9889 Other specified postprocedural states: Secondary | ICD-10-CM | POA: Diagnosis not present

## 2015-04-05 DIAGNOSIS — M47816 Spondylosis without myelopathy or radiculopathy, lumbar region: Secondary | ICD-10-CM | POA: Diagnosis not present

## 2015-04-05 DIAGNOSIS — M5126 Other intervertebral disc displacement, lumbar region: Secondary | ICD-10-CM | POA: Diagnosis not present

## 2015-04-05 DIAGNOSIS — M47817 Spondylosis without myelopathy or radiculopathy, lumbosacral region: Secondary | ICD-10-CM | POA: Diagnosis not present

## 2015-04-05 DIAGNOSIS — M519 Unspecified thoracic, thoracolumbar and lumbosacral intervertebral disc disorder: Secondary | ICD-10-CM | POA: Diagnosis not present

## 2015-04-05 DIAGNOSIS — M5136 Other intervertebral disc degeneration, lumbar region: Secondary | ICD-10-CM | POA: Diagnosis not present

## 2015-04-05 DIAGNOSIS — M4854XA Collapsed vertebra, not elsewhere classified, thoracic region, initial encounter for fracture: Secondary | ICD-10-CM | POA: Diagnosis not present

## 2015-04-05 DIAGNOSIS — M47819 Spondylosis without myelopathy or radiculopathy, site unspecified: Secondary | ICD-10-CM | POA: Diagnosis not present

## 2015-04-07 ENCOUNTER — Encounter (HOSPITAL_COMMUNITY): Payer: Self-pay | Admitting: *Deleted

## 2015-04-07 ENCOUNTER — Other Ambulatory Visit: Payer: Self-pay | Admitting: Neurosurgery

## 2015-04-07 DIAGNOSIS — M545 Low back pain: Secondary | ICD-10-CM | POA: Diagnosis not present

## 2015-04-07 DIAGNOSIS — S22080A Wedge compression fracture of T11-T12 vertebra, initial encounter for closed fracture: Secondary | ICD-10-CM | POA: Diagnosis not present

## 2015-04-07 NOTE — Progress Notes (Signed)
Pt denies cardiac history, chest pain or sob. 

## 2015-04-10 ENCOUNTER — Encounter (HOSPITAL_COMMUNITY): Payer: Self-pay | Admitting: *Deleted

## 2015-04-10 ENCOUNTER — Encounter (HOSPITAL_COMMUNITY)
Admission: RE | Disposition: A | Discharge status: Home / Self Care | Payer: Self-pay | Source: Ambulatory Visit | Attending: Neurosurgery

## 2015-04-10 ENCOUNTER — Ambulatory Visit (HOSPITAL_COMMUNITY): Payer: Medicare Other | Admitting: Anesthesiology

## 2015-04-10 ENCOUNTER — Observation Stay (HOSPITAL_COMMUNITY): Payer: Medicare Other

## 2015-04-10 ENCOUNTER — Observation Stay (HOSPITAL_COMMUNITY)
Admission: RE | Admit: 2015-04-10 | Discharge: 2015-04-10 | Disposition: A | Discharge status: Home / Self Care | Payer: Medicare Other | Source: Ambulatory Visit | Attending: Neurosurgery | Admitting: Neurosurgery

## 2015-04-10 DIAGNOSIS — S22080A Wedge compression fracture of T11-T12 vertebra, initial encounter for closed fracture: Secondary | ICD-10-CM | POA: Diagnosis not present

## 2015-04-10 DIAGNOSIS — Z87891 Personal history of nicotine dependence: Secondary | ICD-10-CM | POA: Diagnosis not present

## 2015-04-10 DIAGNOSIS — E785 Hyperlipidemia, unspecified: Secondary | ICD-10-CM | POA: Diagnosis not present

## 2015-04-10 DIAGNOSIS — Z7982 Long term (current) use of aspirin: Secondary | ICD-10-CM | POA: Insufficient documentation

## 2015-04-10 DIAGNOSIS — M549 Dorsalgia, unspecified: Secondary | ICD-10-CM

## 2015-04-10 DIAGNOSIS — M4854XA Collapsed vertebra, not elsewhere classified, thoracic region, initial encounter for fracture: Principal | ICD-10-CM | POA: Insufficient documentation

## 2015-04-10 DIAGNOSIS — D509 Iron deficiency anemia, unspecified: Secondary | ICD-10-CM | POA: Diagnosis not present

## 2015-04-10 DIAGNOSIS — I10 Essential (primary) hypertension: Secondary | ICD-10-CM | POA: Diagnosis not present

## 2015-04-10 DIAGNOSIS — K219 Gastro-esophageal reflux disease without esophagitis: Secondary | ICD-10-CM | POA: Diagnosis not present

## 2015-04-10 HISTORY — PX: KYPHOPLASTY: SHX5884

## 2015-04-10 LAB — SURGICAL PCR SCREEN
MRSA, PCR: NEGATIVE
Staphylococcus aureus: NEGATIVE

## 2015-04-10 LAB — BASIC METABOLIC PANEL
ANION GAP: 7 (ref 5–15)
BUN: 9 mg/dL (ref 6–20)
CALCIUM: 8.9 mg/dL (ref 8.9–10.3)
CO2: 27 mmol/L (ref 22–32)
CREATININE: 0.6 mg/dL (ref 0.44–1.00)
Chloride: 99 mmol/L — ABNORMAL LOW (ref 101–111)
GFR calc Af Amer: >60 mL/min (ref >60)
GLUCOSE: 104 mg/dL — AB (ref 65–99)
Potassium: 4.3 mmol/L (ref 3.5–5.1)
Sodium: 133 mmol/L — ABNORMAL LOW (ref 135–145)

## 2015-04-10 LAB — CBC
HCT: 34.1 % — ABNORMAL LOW (ref 36.0–46.0)
Hemoglobin: 11.5 g/dL — ABNORMAL LOW (ref 12.0–15.0)
MCH: 31.9 pg (ref 26.0–34.0)
MCHC: 33.7 g/dL (ref 30.0–36.0)
MCV: 94.5 fL (ref 78.0–100.0)
PLATELETS: 250 10*3/uL (ref 150–400)
RBC: 3.61 MIL/uL — ABNORMAL LOW (ref 3.87–5.11)
RDW: 12.5 % (ref 11.5–15.5)
WBC: 5.9 10*3/uL (ref 4.0–10.5)

## 2015-04-10 SURGERY — KYPHOPLASTY
Anesthesia: General | Site: Back

## 2015-04-10 MED ORDER — LIDOCAINE HCL (CARDIAC) 20 MG/ML IV SOLN
INTRAVENOUS | Status: DC | PRN
  Administered 2015-04-10: 40 mg via INTRAVENOUS

## 2015-04-10 MED ORDER — LACTATED RINGERS IV SOLN
INTRAVENOUS | Status: DC | PRN
  Administered 2015-04-10: 08:00:00 via INTRAVENOUS

## 2015-04-10 MED ORDER — HYDROXYZINE HCL 50 MG/ML IM SOLN
50.0000 mg | INTRAMUSCULAR | Status: DC | PRN
  Filled 2015-04-10: qty 1

## 2015-04-10 MED ORDER — ONDANSETRON HCL 4 MG/2ML IJ SOLN
INTRAMUSCULAR | Status: AC
  Filled 2015-04-10: qty 2

## 2015-04-10 MED ORDER — METOPROLOL TARTRATE 50 MG PO TABS
50.0000 mg | ORAL_TABLET | Freq: Every day | ORAL | Status: DC
  Filled 2015-04-10: qty 1

## 2015-04-10 MED ORDER — BISACODYL 10 MG RE SUPP: 10.0000 mg | Freq: Every day | RECTAL | Status: DC | PRN

## 2015-04-10 MED ORDER — ONDANSETRON HCL 4 MG PO TABS: 4.0000 mg | ORAL_TABLET | Freq: Four times a day (QID) | ORAL | Status: DC | PRN

## 2015-04-10 MED ORDER — ACETAMINOPHEN 650 MG RE SUPP: 650.0000 mg | RECTAL | Status: DC | PRN

## 2015-04-10 MED ORDER — LIDOCAINE-EPINEPHRINE 1 %-1:100000 IJ SOLN
INTRAMUSCULAR | Status: DC | PRN
  Administered 2015-04-10: 3.5 mL

## 2015-04-10 MED ORDER — LIDOCAINE HCL (CARDIAC) 20 MG/ML IV SOLN
INTRAVENOUS | Status: AC
  Filled 2015-04-10: qty 5

## 2015-04-10 MED ORDER — FENTANYL CITRATE (PF) 100 MCG/2ML IJ SOLN
INTRAMUSCULAR | Status: DC | PRN
  Administered 2015-04-10 (×2): 50 ug via INTRAVENOUS

## 2015-04-10 MED ORDER — METOPROLOL TARTRATE 25 MG PO TABS
25.0000 mg | ORAL_TABLET | Freq: Every day | ORAL | Status: DC
  Filled 2015-04-10: qty 1

## 2015-04-10 MED ORDER — ACETAMINOPHEN 325 MG PO TABS: 650.0000 mg | ORAL_TABLET | ORAL | Status: DC | PRN

## 2015-04-10 MED ORDER — ARTIFICIAL TEARS OP OINT
TOPICAL_OINTMENT | OPHTHALMIC | Status: AC
  Filled 2015-04-10: qty 3.5

## 2015-04-10 MED ORDER — METOPROLOL TARTRATE 25 MG PO TABS: 25.0000 mg | ORAL_TABLET | Freq: Two times a day (BID) | ORAL | Status: DC

## 2015-04-10 MED ORDER — ACETAMINOPHEN 325 MG PO TABS: 325.0000 mg | ORAL_TABLET | ORAL | Status: DC | PRN

## 2015-04-10 MED ORDER — ROCURONIUM BROMIDE 50 MG/5ML IV SOLN
INTRAVENOUS | Status: AC
  Filled 2015-04-10: qty 1

## 2015-04-10 MED ORDER — MENTHOL 3 MG MT LOZG: 1.0000 | LOZENGE | OROMUCOSAL | Status: DC | PRN

## 2015-04-10 MED ORDER — FENTANYL CITRATE (PF) 100 MCG/2ML IJ SOLN: 25.0000 ug | INTRAMUSCULAR | Status: DC | PRN

## 2015-04-10 MED ORDER — FENTANYL CITRATE (PF) 250 MCG/5ML IJ SOLN
INTRAMUSCULAR | Status: AC
  Filled 2015-04-10: qty 5

## 2015-04-10 MED ORDER — BUPIVACAINE HCL (PF) 0.25 % IJ SOLN
INTRAMUSCULAR | Status: DC | PRN
  Administered 2015-04-10: 3.5 mL

## 2015-04-10 MED ORDER — METOPROLOL TARTRATE 50 MG PO TABS
ORAL_TABLET | ORAL | Status: AC
  Filled 2015-04-10: qty 1

## 2015-04-10 MED ORDER — ACETAMINOPHEN 160 MG/5ML PO SOLN: 325.0000 mg | ORAL | Status: DC | PRN

## 2015-04-10 MED ORDER — PHENOL 1.4 % MT LIQD: 1.0000 | OROMUCOSAL | Status: DC | PRN

## 2015-04-10 MED ORDER — 0.9 % SODIUM CHLORIDE (POUR BTL) OPTIME
TOPICAL | Status: DC | PRN
  Administered 2015-04-10: 1000 mL

## 2015-04-10 MED ORDER — METOPROLOL TARTRATE 50 MG PO TABS
50.0000 mg | ORAL_TABLET | Freq: Once | ORAL | Status: AC
Start: 2015-04-10 — End: 2015-04-10
  Administered 2015-04-10: 50 mg via ORAL

## 2015-04-10 MED ORDER — SODIUM CHLORIDE 0.9 % IJ SOLN: 3.0000 mL | INTRAMUSCULAR | Status: DC | PRN

## 2015-04-10 MED ORDER — HYDROCODONE-ACETAMINOPHEN 5-325 MG PO TABS
1.0000 | ORAL_TABLET | ORAL | Status: DC | PRN
  Administered 2015-04-10 (×3): 1 via ORAL
  Filled 2015-04-10 (×3): qty 1

## 2015-04-10 MED ORDER — ONDANSETRON HCL 4 MG/2ML IJ SOLN: 4.0000 mg | Freq: Four times a day (QID) | INTRAMUSCULAR | Status: DC | PRN

## 2015-04-10 MED ORDER — PROPOFOL 10 MG/ML IV BOLUS
INTRAVENOUS | Status: DC | PRN
  Administered 2015-04-10: 100 mg via INTRAVENOUS
  Administered 2015-04-10: 30 mg via INTRAVENOUS

## 2015-04-10 MED ORDER — SODIUM CHLORIDE 0.9 % IJ SOLN: 3.0000 mL | Freq: Two times a day (BID) | INTRAMUSCULAR | Status: DC

## 2015-04-10 MED ORDER — ALUM & MAG HYDROXIDE-SIMETH 200-200-20 MG/5ML PO SUSP: 30.0000 mL | Freq: Four times a day (QID) | ORAL | Status: DC | PRN

## 2015-04-10 MED ORDER — SODIUM CHLORIDE 0.9 % IV SOLN: 250.0000 mL | INTRAVENOUS | Status: DC

## 2015-04-10 MED ORDER — OXYCODONE HCL 5 MG PO TABS: 5.0000 mg | ORAL_TABLET | Freq: Once | ORAL | Status: DC | PRN

## 2015-04-10 MED ORDER — POTASSIUM CHLORIDE IN NACL 20-0.9 MEQ/L-% IV SOLN
INTRAVENOUS | Status: DC
  Filled 2015-04-10 (×3): qty 1000

## 2015-04-10 MED ORDER — OXYCODONE HCL 5 MG/5ML PO SOLN: 5.0000 mg | Freq: Once | ORAL | Status: DC | PRN

## 2015-04-10 MED ORDER — EPHEDRINE SULFATE 50 MG/ML IJ SOLN
INTRAMUSCULAR | Status: DC | PRN
  Administered 2015-04-10: 15 mg via INTRAVENOUS
  Administered 2015-04-10: 10 mg via INTRAVENOUS

## 2015-04-10 MED ORDER — HYDROXYZINE HCL 25 MG PO TABS: 50.0000 mg | ORAL_TABLET | ORAL | Status: DC | PRN

## 2015-04-10 MED ORDER — SUCCINYLCHOLINE CHLORIDE 20 MG/ML IJ SOLN
INTRAMUSCULAR | Status: DC | PRN
  Administered 2015-04-10: 40 mg via INTRAVENOUS

## 2015-04-10 MED ORDER — ONDANSETRON HCL 4 MG/2ML IJ SOLN
INTRAMUSCULAR | Status: DC | PRN
  Administered 2015-04-10: 4 mg via INTRAVENOUS

## 2015-04-10 MED ORDER — CEFAZOLIN SODIUM-DEXTROSE 2-3 GM-% IV SOLR
INTRAVENOUS | Status: DC | PRN
  Administered 2015-04-10: 2 g via INTRAVENOUS

## 2015-04-10 MED ORDER — MUPIROCIN 2 % EX OINT
TOPICAL_OINTMENT | CUTANEOUS | Status: AC
  Administered 2015-04-10: 1
  Filled 2015-04-10: qty 22

## 2015-04-10 MED ORDER — PROPOFOL 10 MG/ML IV BOLUS
INTRAVENOUS | Status: AC
  Filled 2015-04-10: qty 20

## 2015-04-10 MED ORDER — MAGNESIUM HYDROXIDE 400 MG/5ML PO SUSP: 30.0000 mL | Freq: Every day | ORAL | Status: DC | PRN

## 2015-04-10 SURGICAL SUPPLY — 44 items
ADH SKN CLS APL DERMABOND .7 (GAUZE/BANDAGES/DRESSINGS)
ADH SKN CLS LQ APL DERMABOND (GAUZE/BANDAGES/DRESSINGS) ×1
BANDAGE ADH SHEER 1  50/CT (GAUZE/BANDAGES/DRESSINGS) ×8 IMPLANT
BLADE CLIPPER SURG (BLADE) IMPLANT
BLADE SURG 11 STRL SS (BLADE) ×2 IMPLANT
CEMENT BONE KYPHX HV R (Orthopedic Implant) ×1 IMPLANT
DECANTER SPIKE VIAL GLASS SM (MISCELLANEOUS) ×2 IMPLANT
DERMABOND ADHESIVE PROPEN (GAUZE/BANDAGES/DRESSINGS) ×1
DERMABOND ADVANCED (GAUZE/BANDAGES/DRESSINGS)
DERMABOND ADVANCED .7 DNX12 (GAUZE/BANDAGES/DRESSINGS) IMPLANT
DERMABOND ADVANCED .7 DNX6 (GAUZE/BANDAGES/DRESSINGS) IMPLANT
DRAPE C-ARM 42X72 X-RAY (DRAPES) ×2 IMPLANT
DRAPE INCISE IOBAN 66X45 STRL (DRAPES) ×2 IMPLANT
DRAPE LAPAROTOMY 100X72X124 (DRAPES) ×2 IMPLANT
DRAPE PROXIMA HALF (DRAPES) ×2 IMPLANT
DURAPREP 26ML APPLICATOR (WOUND CARE) ×2 IMPLANT
GAUZE SPONGE 4X4 16PLY XRAY LF (GAUZE/BANDAGES/DRESSINGS) ×2 IMPLANT
GLOVE BIOGEL PI IND STRL 8 (GLOVE) ×1 IMPLANT
GLOVE BIOGEL PI INDICATOR 8 (GLOVE) ×1
GLOVE ECLIPSE 7.5 STRL STRAW (GLOVE) ×2 IMPLANT
GLOVE EXAM NITRILE LRG STRL (GLOVE) IMPLANT
GLOVE EXAM NITRILE MD LF STRL (GLOVE) IMPLANT
GLOVE EXAM NITRILE XL STR (GLOVE) IMPLANT
GLOVE EXAM NITRILE XS STR PU (GLOVE) IMPLANT
GOWN STRL REUS W/ TWL LRG LVL3 (GOWN DISPOSABLE) IMPLANT
GOWN STRL REUS W/ TWL XL LVL3 (GOWN DISPOSABLE) IMPLANT
GOWN STRL REUS W/TWL 2XL LVL3 (GOWN DISPOSABLE) IMPLANT
GOWN STRL REUS W/TWL LRG LVL3 (GOWN DISPOSABLE)
GOWN STRL REUS W/TWL XL LVL3 (GOWN DISPOSABLE)
KIT BASIN OR (CUSTOM PROCEDURE TRAY) ×2 IMPLANT
KIT ROOM TURNOVER OR (KITS) ×2 IMPLANT
NDL HYPO 25X1 1.5 SAFETY (NEEDLE) ×1 IMPLANT
NEEDLE HYPO 25X1 1.5 SAFETY (NEEDLE) ×2 IMPLANT
NS IRRIG 1000ML POUR BTL (IV SOLUTION) ×2 IMPLANT
PACK SURGICAL SETUP 50X90 (CUSTOM PROCEDURE TRAY) ×2 IMPLANT
PAD ARMBOARD 7.5X6 YLW CONV (MISCELLANEOUS) ×6 IMPLANT
SPECIMEN JAR SMALL (MISCELLANEOUS) IMPLANT
SUT VIC AB 3-0 SH 8-18 (SUTURE) ×2 IMPLANT
SUT VIC AB 4-0 P-3 18X BRD (SUTURE) ×1 IMPLANT
SUT VIC AB 4-0 P3 18 (SUTURE) ×2
SYR CONTROL 10ML LL (SYRINGE) ×4 IMPLANT
TOWEL OR 17X24 6PK STRL BLUE (TOWEL DISPOSABLE) ×2 IMPLANT
TOWEL OR 17X26 10 PK STRL BLUE (TOWEL DISPOSABLE) ×2 IMPLANT
TRAY KYPHOPAK 15/3 ONESTEP 1ST (MISCELLANEOUS) ×1 IMPLANT

## 2015-04-10 NOTE — Discharge Instructions (Signed)

## 2015-04-10 NOTE — H&P (Signed)
Subjective: Patient is a 81 y.o. right-handed white female who is admitted for treatment of T11 compression fracture.  Patient developed acute pain 4 weeks ago, following an exercise class at her retirement community. It was initially severe and disabling, but has persisted and although not as severe, remains disabling. Workup revealed a acute/subacute T11 compression fracture, as well as an old T8 compression fracture.  Because of persistent disabling pain, the patient is admitted for T11 kyphoplasty.   Patient Active Problem List   Diagnosis Date Noted  . Left hip pain 07/13/2014  . GERD (gastroesophageal reflux disease) 07/13/2014  . Dry mouth 07/13/2014  . 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 Gait disorder 04/21/2014  . Constipation 06/21/2013  . Depression with anxiety 06/21/2013  . Diverticulosis of colon without hemorrhage 06/13/2013  . S/P Nissen fundoplication (without gastrostomy tube) procedure 06/13/2013  . Lactose intolerance 06/13/2013  . PVCs (premature ventricular contractions) 06/13/2013  . Fracture of femoral neck, left ([place]) 06/12/2013  . Hip fracture requiring operative repair ([place]) 06/12/2013  . Closed left hip fracture ([place]) 06/12/2013  . Leukocytosis, unspecified 06/12/2013  . Postoperative anemia due to acute blood loss 12/25/2011  . Lumbar radiculopathy, chronic 12/23/2011  . Hyperlipidemia   . Hypertension   . Tachycardia   . Herpes ocular   . Anemia, iron deficiency   . Right knee DJD   . Status post total knee replacement    Past Medical History  Diagnosis Date  . Herpes ocular     history of  . GERD (gastroesophageal reflux disease)   . Depression   . High cholesterol   . Hypertension     under control; has been on med. x 10 yr.  . History of hiatal hernia   . Osteoarthritis of knee     bilateral  . Anemia, iron deficiency   . Dental crowns present   . Fracture of femoral neck, left ([place]) 06/12/2013  . Stenosing tenosynovitis of finger  08/2012    right index finger  . Sciatic pain   . Gait disorder 04/21/2014    Past Surgical History  Procedure Laterality Date  . Lumbar laminectomy/decompression microdiscectomy  08/29/2010    L2-S1  . Shoulder arthroscopy with rotator cuff repair and subacromial decompression Right 01/01/2000  . Total knee arthroplasty Left 05/14/2004  . Abdominal hysterectomy  1995    partial  . Laparoscopic nissen fundoplication  5/63/1497  . Descemets stripping automated endothelial keratoplasty Left 03/14/2011  . Tonsillectomy      as child  . Colonoscopy    . Total knee arthroplasty  12/23/2011    Procedure: TOTAL KNEE ARTHROPLASTY;  Surgeon: Lorn Junes, MD;  Location: [facility];  Service: Orthopedics;  Laterality: Right;  DR Hatton THIS CASE  . Knee arthroscopy Right 05/11/2001  . Trigger finger release Right 07/21/2007    thumb  . Carpal tunnel release Right 07/21/2007  . Trigger finger release Left 09/24/2007    middle finger  . Carpal tunnel release Left 09/24/2007  . Trigger finger release Right 03/10/2008    ring and little fingers  . Descemets stripping automated endothelial keratoplasty Right 08/20/2012  . Trigger finger release Right 09/02/2012    Procedure: RELEASE TRIGGER FINGER/A-1 PULLEY RIGHT INDEX FINGER;  Surgeon: Wynonia Sours, MD;  Location: [facility];  Service: Orthopedics;  Laterality: Right;  . Trigger finger release Left 12/22/2012    Procedure: RELEASE TRIGGER FINGER/A-1 PULLEY LEFT RING FINGER;  Surgeon: Dominica Severin  Beola Cord, MD;  Location: [facility];  Service: Orthopedics;  Laterality: Left;  Left   . Joint replacement      bilateral knees, left knee  . Hip arthroplasty Left 06/13/2013    Procedure: ARTHROPLASTY BIPOLAR HIP; Injection left shoulder;  Surgeon: Johnny Bridge, MD;  Location: [facility];  Service: Orthopedics;  Laterality: Left;  . Hernia repair    . Total hip revision Left 07/08/2014    Procedure: TOTAL HIP REVISION;   Surgeon: Kerin Salen, MD;  Location: [facility];  Service: Orthopedics;  Laterality: Left;  [facility] Eye surgery Bilateral     cataract surgery  . Eye surgery Left     corneal transplant    Prescriptions prior to admission  Medication Sig Dispense Refill Last Dose  . acetaminophen (TYLENOL) 500 MG tablet Take 500 mg by mouth every 6 (six) hours as needed for pain.   unkn  . acyclovir (ZOVIRAX) 400 MG tablet Take 400-800 mg by mouth 2 (two) times daily.    04/09/2015 at Unknown time  . ALPRAZolam (XANAX) 0.5 MG tablet Take 0.25 mg by mouth at bedtime as needed for sleep.   unkn  . aspirin EC 81 MG tablet Take 81 mg by mouth daily.   04/08/2015  . atorvastatin (LIPITOR) 20 MG tablet Take 20 mg by mouth daily.   04/09/2015 at Unknown time  . cholecalciferol (VITAMIN D) 1000 UNITS tablet Take 2,000 Units by mouth daily.   04/09/2015 at Unknown time  . docusate sodium (COLACE) 100 MG capsule Take 1 capsule by mouth daily as needed for mild constipation.    Past Week at Unknown time  . ferrous sulfate 325 (65 FE) MG tablet Take 325 mg by mouth daily.   04/09/2015 at Unknown time  . metoprolol (LOPRESSOR) 50 MG tablet Take 25-50 mg by mouth 2 (two) times daily. Take 50 MG in the morning and take 25 MG at bedtime.   04/09/2015 at 0830  . omeprazole (PRILOSEC) 40 MG capsule Take 40 mg by mouth 2 (two) times daily.    04/09/2015 at Unknown time  . venlafaxine XR (EFFEXOR-XR) 75 MG 24 hr capsule Take 75 mg by mouth at bedtime.    04/09/2015 at Unknown time   Allergies  Allergen Reactions  . Morphine And Related Other (See Comments)    AGITATION, STRANGE DREAMS  . Scopace [Scopolamine] Other (See Comments)    MENTAL CHANGES  . Nsaids Other (See Comments)    On paperwork from facility  . Pravachol [Pravastatin Sodium] Other (See Comments)    On paperwork from facility    Social History  Substance Use Topics  . Smoking status: Former Research scientist (life sciences)  . Smokeless tobacco: Never Used     Comment: quit smoking 20-30  yr. ago  . Alcohol Use: No    Family History  Problem Relation Age of Onset  . Heart attack Mother   . Parkinsonism Father   . Diabetes Brother      Review of Systems A comprehensive review of systems was negative.  Objective: Vital signs in last 24 hours: Temp:  [98.6 F (37 C)] 98.6 F (37 C) (10/17 0620) Pulse Rate:  [71] 71 (10/17 0620) Resp:  [14] 14 (10/17 0620) BP: (147)/(57) 147/57 mmHg (10/17 0620) SpO2:  [100 %] 100 % (10/17 0620) Weight:  [58.06 kg (128 lb)] 58.06 kg (128 lb) (10/17 7564)  EXAM: Patient well-developed well-nourished female in no acute distress. Lungs are clear to auscultation , the  patient has symmetrical respiratory excursion. Heart has a regular rate and rhythm normal S1 and S2 no murmur.   Abdomen is soft nontender nondistended bowel sounds are present. Extremity examination shows no clubbing cyanosis or edema. Motor examination shows 5 over 5 strength in the lower extremities including the iliopsoas quadriceps dorsiflexor extensor hallicus  longus and plantar flexor bilaterally. Sensation is intact to pinprick in the distal lower extremities. Reflexes are symmetrical bilaterally. No pathologic reflexes are present. Patient has a normal gait and stance.   Data Review:CBC    Component Value Date/Time   WBC 14.9* 03/15/2015 2343   WBC 6.3 07/19/2014   RBC 3.77* 03/15/2015 2343   HGB 12.4 03/15/2015 2343   HCT 35.3* 03/15/2015 2343   PLT 308 03/15/2015 2343   MCV 93.6 03/15/2015 2343   MCH 32.9 03/15/2015 2343   MCHC 35.1 03/15/2015 2343   RDW 12.4 03/15/2015 2343   LYMPHSABS 2.2 03/15/2015 2343   MONOABS 1.4* 03/15/2015 2343   EOSABS 0.0 03/15/2015 2343   BASOSABS 0.0 03/15/2015 2343                          BMET    Component Value Date/Time   NA 133* 03/15/2015 2343   K 4.5 03/15/2015 2343   CL 100* 03/15/2015 2343   CO2 26 03/15/2015 2343   GLUCOSE 133* 03/15/2015 2343   BUN 17 03/15/2015 2343   CREATININE 0.69 03/15/2015 2343    CALCIUM 9.3 03/15/2015 2343   GFRNONAA >60 03/15/2015 2343   GFRAA >60 03/15/2015 2343     Assessment/Plan: Patient with a acute/subacute T11 compression fracture, with persistent disabling pain who is admitted for T11 kyphoplasty.  I've discussed with the patient the nature of his condition, the nature the surgical procedure, the typical length of surgery, hospital stay, and overall recuperation. We discussed limitations postoperatively. I discussed risks of surgery including risks of infection, bleeding, possibly need for transfusion, the risk of nerve root dysfunction with pain, weakness, numbness, or paresthesias, or risk of dural tear and CSF leakage and possible need for further surgery, and the risk of anesthetic complications including myocardial infarction, stroke, pneumonia, and death. Understanding all this the patient does wish to proceed with surgery and is admitted for such.    Hosie Spangle, MD 04/10/2015 7:15 AM

## 2015-04-10 NOTE — Discharge Summary (Signed)
Physician Discharge Summary  Patient ID: Kristin Solomon MRN: [id number] DOB/AGE: 79-Mar-1931 79 y.o.  Admit date: 04/10/2015 Discharge date: 04/10/2015  Admission Diagnoses:  T11 Compression Fracture  Discharge Diagnoses:  T11 Compression Fracture Active Problems:   Wedge compression fracture of T11 vertebra [facility])   Discharged Condition: good  Hospital Course: Patient admitted, underwent a T11 kyphoplasty for T11 compression fracture. She has been up and ambulating actively. Wounds are clean and dry. She is voiding well. We are discharging her to home with instructions regarding wound care and activities. She is scheduled to follow-up with me in the office in 3 weeks with x-rays.  Discharge Exam: Blood pressure 108/46, pulse 81, temperature 98.2 F (36.8 C), temperature source Oral, resp. rate 16, height 5\' 2"  (1.575 m), weight 58.06 kg (128 lb), SpO2 96 %.  Disposition: 01-Home or Self Care     Medication List    TAKE these medications        acetaminophen 500 MG tablet  Commonly known as:  TYLENOL  Take 500 mg by mouth every 6 (six) hours as needed for pain.     acyclovir 400 MG tablet  Commonly known as:  ZOVIRAX  Take 400-800 mg by mouth 2 (two) times daily.     ALPRAZolam 0.5 MG tablet  Commonly known as:  XANAX  Take 0.25 mg by mouth at bedtime as needed for sleep.     aspirin EC 81 MG tablet  Take 81 mg by mouth daily.     atorvastatin 20 MG tablet  Commonly known as:  LIPITOR  Take 20 mg by mouth daily.     cholecalciferol 1000 UNITS tablet  Commonly known as:  VITAMIN D  Take 2,000 Units by mouth daily.     docusate sodium 100 MG capsule  Commonly known as:  COLACE  Take 1 capsule by mouth daily as needed for mild constipation.     ferrous sulfate 325 (65 FE) MG tablet  Take 325 mg by mouth daily.     metoprolol 50 MG tablet  Commonly known as:  LOPRESSOR  Take 25-50 mg by mouth 2 (two) times daily. Take 50 MG in the morning and take 25 MG at  bedtime.     omeprazole 40 MG capsule  Commonly known as:  PRILOSEC  Take 40 mg by mouth 2 (two) times daily.     venlafaxine XR 75 MG 24 hr capsule  Commonly known as:  EFFEXOR-XR  Take 75 mg by mouth at bedtime.         SignedHosie Spangle 04/10/2015, 4:01 PM

## 2015-04-10 NOTE — Progress Notes (Signed)
Pt doing well. Pt and son given D/C instructions with Rx's, verbal understanding was provided. Pt's IV was removed prior to D/C. Pt's incisions are clean and dry with no sign of infection. Pt D/C'd home via wheelchair @ 1650 per MD order. Pt is stable @ D/C and has no other needs at this time. Holli Humbles, RN

## 2015-04-10 NOTE — Op Note (Signed)
04/10/2015  8:50 AM  PATIENT:  Kristin Solomon  81 y.o. female  PRE-OPERATIVE DIAGNOSIS:  T11 Compression Fracture  POST-OPERATIVE DIAGNOSIS:  T11 Compression Fracture  PROCEDURE:  Procedure(s):  T11 Kyphoplasty  SURGEON:  Surgeon(s): Jovita Gamma, MD  ANESTHESIA:   general  EBL:  Total I/O In: 500 [I.V.:500] Out: -   BLOOD ADMINISTERED:none  COUNT:  correct per nursing staff  DICTATION: Patient was brought to the operating room, placed under general endotracheal anesthesia. AP and lateral C-arm fluoroscopy units were set up, and the T11 vertebra identified. The thoracolumbar region posteriorly was prepped with DuraPrep, and draped in a sterile fashion. The C-arm fluoroscopy units were also draped in a sterile fashion. The entry points to approach the posterior lateral aspect of the T11 pedicles were identified bilaterally.  The skin and subcutaneous tissue were infiltrated with local anesthetic with epinephrine. 3 mm incisions were made on either side at the entry points. The cannulated trocar was passed down to the posterior lateral entry point of the pedicles, on each side. Each trocar was passed through the pedicle with C-arm fluoroscopic guidance into the vertebral body on either side. We then removed the inner trocar, leaving the outer cannula. Using the drill we created a passage through the vertebral body, to the ventral wall. The balloon expanders were placed in the vertebral body on either side, and gently inflated and expanded under C-arm fluoroscopic guidance. Once good compaction was achieved bilaterally, the balloon expanders were deflated, and we began to fill the cavities that had been created with methylmethacrylate. We filled from one side to the other, using C-arm fluoroscopic guidance, until good filling of the cavity, and good interdigitation with the bone was achieved bilaterally. A total of 1.5 cc of methylmethacrylate was introduced on the right side and a  total of 2.5 cc of methylmethacrylate was introduced on the left side, for a total of 4 cc. We then carefully removed each of the cannulas, using C-arm for guidance. Final imaging showed good filling of the cavity. Each of the incisions was closed with a single 3-0 undyed Vicryl subcuticular suture, and Dermabond. Following surgery the patient was turned back to a supine position, to be reversed and the anesthetic, extubated, and transferred to the recovery room for further care.  PLAN OF CARE: Admit for overnight observation  PATIENT DISPOSITION:  PACU - hemodynamically stable.   Delay start of Pharmacological VTE agent (>24hrs) due to surgical blood loss or risk of bleeding:  yes

## 2015-04-10 NOTE — Anesthesia Preprocedure Evaluation (Addendum)
Anesthesia Evaluation  Patient identified by MRN, date of birth, ID band Patient awake    Reviewed: Allergy & Precautions, NPO status , Patient's Chart, lab work & pertinent test results, reviewed documented beta blocker date and time   History of Anesthesia Complications Negative for: history of anesthetic complications  Airway Mallampati: IV  TM Distance: <3 FB Neck ROM: Full  Mouth opening: Limited Mouth Opening  Dental  (+) Partial Upper, Partial Lower, Dental Advisory Given   Pulmonary former smoker,    breath sounds clear to auscultation       Cardiovascular hypertension, Pt. on home beta blockers  Rhythm:Regular Rate:Normal     Neuro/Psych  Neuromuscular disease    GI/Hepatic Neg liver ROS, hiatal hernia, GERD  ,  Endo/Other  negative endocrine ROS  Renal/GU negative Renal ROS     Musculoskeletal  (+) Arthritis ,   Abdominal   Peds  Hematology  (+) anemia ,   Anesthesia Other Findings   Reproductive/Obstetrics                            Anesthesia Physical Anesthesia Plan  ASA: II  Anesthesia Plan: General   Post-op Pain Management:    Induction: Intravenous  Airway Management Planned: LMA  Additional Equipment: None  Intra-op Plan:   Post-operative Plan: Extubation in OR  Informed Consent: I have reviewed the patients History and Physical, chart, labs and discussed the procedure including the risks, benefits and alternatives for the proposed anesthesia with the patient or authorized representative who has indicated his/her understanding and acceptance.   Dental advisory given  Plan Discussed with: CRNA and Surgeon  Anesthesia Plan Comments:         Anesthesia Quick Evaluation

## 2015-04-10 NOTE — Anesthesia Procedure Notes (Signed)
Procedure Name: Intubation Date/Time: 04/10/2015 8:03 AM Performed by: Sampson Si E Pre-anesthesia Checklist: Patient identified, Emergency Drugs available, Suction available, Patient being monitored and Timeout performed Patient Re-evaluated:Patient Re-evaluated prior to inductionOxygen Delivery Method: Circle system utilized Preoxygenation: Pre-oxygenation with 100% oxygen Intubation Type: IV induction Ventilation: Mask ventilation without difficulty Laryngoscope Size: Glidescope and 3 Grade View: Grade I Tube type: Oral Tube size: 7.0 mm Number of attempts: 1 Airway Equipment and Method: Stylet and Video-laryngoscopy Placement Confirmation: ETT inserted through vocal cords under direct vision,  positive ETCO2 and breath sounds checked- equal and bilateral Secured at: 21 cm Tube secured with: Tape Dental Injury: Teeth and Oropharynx as per pre-operative assessment  Difficulty Due To: Difficulty was anticipated, Difficult Airway- due to anterior larynx and Difficult Airway- due to limited oral opening Future Recommendations: Recommend- induction with short-acting agent, and alternative techniques readily available Comments: Elective glide scope used due to previous difficult intubations. Recommend use of video laryngoscopy with future intubations.

## 2015-04-10 NOTE — Transfer of Care (Signed)
Immediate Anesthesia Transfer of Care Note  Patient: Kristin Solomon  Procedure(s) Performed: Procedure(s) with comments: T11 Kyphoplasty (N/A) - T11 Kyphoplasty  Patient Location: PACU  Anesthesia Type:General  Level of Consciousness: lethargic and responds to stimulation  Airway & Oxygen Therapy: Patient Spontanous Breathing and Patient connected to nasal cannula oxygen  Post-op Assessment: Report given to RN and Patient moving all extremities X 4  Post vital signs: Reviewed and stable  Last Vitals:  Filed Vitals:   04/10/15 0620  BP: 147/57  Pulse: 71  Temp: 37 C  Resp: 14    Complications: No apparent anesthesia complications

## 2015-04-11 ENCOUNTER — Encounter (HOSPITAL_COMMUNITY): Payer: Self-pay | Admitting: Neurosurgery

## 2015-04-11 NOTE — Anesthesia Postprocedure Evaluation (Signed)
  Anesthesia Post-op Note  Patient: Kristin Solomon  Procedure(s) Performed: Procedure(s) with comments: T11 Kyphoplasty (N/A) - T11 Kyphoplasty  Patient Location: PACU  Anesthesia Type:General  Level of Consciousness: awake  Airway and Oxygen Therapy: Patient Spontanous Breathing  Post-op Pain: mild  Post-op Assessment: Post-op Vital signs reviewed, Patient's Cardiovascular Status Stable, Respiratory Function Stable, Patent Airway, No signs of Nausea or vomiting and Pain level controlled   LLE Sensation: Full sensation   RLE Sensation: Full sensation      Post-op Vital Signs: Reviewed and stable  Last Vitals:  Filed Vitals:   04/10/15 1552  BP: 108/46  Pulse: 81  Temp:   Resp: 16    Complications: No apparent anesthesia complications

## 2015-04-20 DIAGNOSIS — S22080A Wedge compression fracture of T11-T12 vertebra, initial encounter for closed fracture: Secondary | ICD-10-CM | POA: Diagnosis not present

## 2015-05-01 DIAGNOSIS — M4854XA Collapsed vertebra, not elsewhere classified, thoracic region, initial encounter for fracture: Secondary | ICD-10-CM | POA: Diagnosis not present

## 2015-05-01 DIAGNOSIS — S22080A Wedge compression fracture of T11-T12 vertebra, initial encounter for closed fracture: Secondary | ICD-10-CM | POA: Diagnosis not present

## 2015-05-02 ENCOUNTER — Other Ambulatory Visit: Payer: Self-pay | Admitting: Neurosurgery

## 2015-05-05 ENCOUNTER — Encounter (HOSPITAL_COMMUNITY): Payer: Self-pay

## 2015-05-05 ENCOUNTER — Encounter (HOSPITAL_COMMUNITY)
Admission: RE | Admit: 2015-05-05 | Discharge: 2015-05-05 | Disposition: A | Discharge status: Home / Self Care | Payer: Medicare Other | Source: Ambulatory Visit | Attending: Neurosurgery | Admitting: Neurosurgery

## 2015-05-05 DIAGNOSIS — Z79899 Other long term (current) drug therapy: Secondary | ICD-10-CM | POA: Diagnosis not present

## 2015-05-05 DIAGNOSIS — Z87891 Personal history of nicotine dependence: Secondary | ICD-10-CM | POA: Diagnosis not present

## 2015-05-05 DIAGNOSIS — M1990 Unspecified osteoarthritis, unspecified site: Secondary | ICD-10-CM | POA: Diagnosis not present

## 2015-05-05 DIAGNOSIS — Z7982 Long term (current) use of aspirin: Secondary | ICD-10-CM | POA: Diagnosis not present

## 2015-05-05 DIAGNOSIS — M4854XA Collapsed vertebra, not elsewhere classified, thoracic region, initial encounter for fracture: Secondary | ICD-10-CM | POA: Diagnosis not present

## 2015-05-05 DIAGNOSIS — I10 Essential (primary) hypertension: Secondary | ICD-10-CM | POA: Diagnosis not present

## 2015-05-05 DIAGNOSIS — Z886 Allergy status to analgesic agent status: Secondary | ICD-10-CM | POA: Diagnosis not present

## 2015-05-05 DIAGNOSIS — Z96642 Presence of left artificial hip joint: Secondary | ICD-10-CM | POA: Diagnosis not present

## 2015-05-05 DIAGNOSIS — Z96653 Presence of artificial knee joint, bilateral: Secondary | ICD-10-CM | POA: Diagnosis not present

## 2015-05-05 HISTORY — DX: Insomnia, unspecified: G47.00

## 2015-05-05 LAB — BASIC METABOLIC PANEL
ANION GAP: 8 (ref 5–15)
BUN: 8 mg/dL (ref 6–20)
CALCIUM: 9.2 mg/dL (ref 8.9–10.3)
CO2: 23 mmol/L (ref 22–32)
Chloride: 106 mmol/L (ref 101–111)
Creatinine, Ser: 0.68 mg/dL (ref 0.44–1.00)
GFR calc Af Amer: >60 mL/min (ref >60)
GLUCOSE: 99 mg/dL (ref 65–99)
Potassium: 4.3 mmol/L (ref 3.5–5.1)
SODIUM: 137 mmol/L (ref 135–145)

## 2015-05-05 LAB — CBC
HCT: 36.4 % (ref 36.0–46.0)
Hemoglobin: 12.1 g/dL (ref 12.0–15.0)
MCH: 31.6 pg (ref 26.0–34.0)
MCHC: 33.2 g/dL (ref 30.0–36.0)
MCV: 95 fL (ref 78.0–100.0)
PLATELETS: 247 10*3/uL (ref 150–400)
RBC: 3.83 MIL/uL — ABNORMAL LOW (ref 3.87–5.11)
RDW: 12.9 % (ref 11.5–15.5)
WBC: 5.5 10*3/uL (ref 4.0–10.5)

## 2015-05-05 NOTE — Progress Notes (Addendum)
Medical Md is Dr.Robert Gates  EKG and CXR in epic from 06-28-14  Cardiologist denies  Echo denies  Stress test denies  Heart cath denies

## 2015-05-05 NOTE — Pre-Procedure Instructions (Signed)
Kristin Solomon  05/05/2015      [place], [address] [place] [address] Phone: [phone] Fax: [phone]  [facility], [address] [place] [address] Phone: [phone] Fax: [phone]    Your procedure is scheduled on Sat, Nov 12 @ 7:45 AM  Report to [facility]  at 6:00 AM  Call this number if you have problems the morning of surgery:  [phone]   Remember:  Do not eat food or drink liquids after midnight.  Take these medicines the morning of surgery with A SIP OF WATER Acyclovir(Zovirax),Metoprolol(Lopressor),Omeprazole(Prilosec),and Effexor(Venlafaxine)               Stop taking your Aspirin. No Goody's,BC's,Aleve,Advil,Motrin,Ibuprofen,FIsh Oil,or any Herbal Medications.    Do not wear jewelry, make-up or nail polish.  Do not wear lotions, powders, or perfumes.  You may wear deodorant.  Do not shave 48 hours prior to surgery.    Do not bring valuables to the hospital.  [facility] is not responsible for any belongings or valuables.  Contacts, dentures or bridgework may not be worn into surgery.  Leave your suitcase in the car.  After surgery it may be brought to your room.  For patients admitted to the hospital, discharge time will be determined by your treatment team.  Patients discharged the day of surgery will not be allowed to drive home.   Special instructions:   [facility] - Preparing for Surgery  Before surgery, you can play an important role.  Because skin is not sterile, your skin needs to be as free of germs as possible.  You can reduce the number of germs on you skin by washing with CHG (chlorahexidine gluconate) soap before surgery.  CHG is an antiseptic cleaner which kills germs and bonds with the skin to continue killing germs even after washing.  Please DO NOT use if you have an allergy to CHG or  antibacterial soaps.  If your skin becomes reddened/irritated stop using the CHG and inform your nurse when you arrive at Short Stay.  Do not shave (including legs and underarms) for at least 48 hours prior to the first CHG shower.  You may shave your face.  Please follow these instructions carefully:   1.  Shower with CHG Soap the night before surgery and the                                morning of Surgery.  2.  If you choose to wash your hair, wash your hair first as usual with your       normal shampoo.  3.  After you shampoo, rinse your hair and body thoroughly to remove the                      Shampoo.  4.  Use CHG as you would any other liquid soap.  You can apply chg directly       to the skin and wash gently with scrungie or a clean washcloth.  5.  Apply the CHG Soap to your body ONLY FROM THE NECK DOWN.        Do not use on open wounds or open sores.  Avoid contact with your eyes,       ears, mouth and genitals (private  parts).  Wash genitals (private parts)       with your normal soap.  6.  Wash thoroughly, paying special attention to the area where your surgery        will be performed.  7.  Thoroughly rinse your body with warm water from the neck down.  8.  DO NOT shower/wash with your normal soap after using and rinsing off       the CHG Soap.  9.  Pat yourself dry with a clean towel.            10.  Wear clean pajamas.            11.  Place clean sheets on your bed the night of your first shower and do not        sleep with pets.  Day of Surgery  Do not apply any lotions/deoderants the morning of surgery.  Please wear clean clothes to the hospital/surgery center.   Please read over the following fact sheets that you were given. Pain Booklet, Coughing and Deep Breathing, MRSA Information and Surgical Site Infection Prevention

## 2015-05-06 ENCOUNTER — Encounter (HOSPITAL_COMMUNITY): Payer: Self-pay | Admitting: Anesthesiology

## 2015-05-06 ENCOUNTER — Ambulatory Visit (HOSPITAL_COMMUNITY): Payer: Medicare Other | Admitting: Anesthesiology

## 2015-05-06 ENCOUNTER — Observation Stay (HOSPITAL_COMMUNITY): Payer: Medicare Other

## 2015-05-06 ENCOUNTER — Encounter (HOSPITAL_COMMUNITY)
Admission: RE | Disposition: A | Discharge status: Home / Self Care | Payer: Self-pay | Source: Ambulatory Visit | Attending: Neurosurgery

## 2015-05-06 ENCOUNTER — Observation Stay (HOSPITAL_COMMUNITY)
Admission: RE | Admit: 2015-05-06 | Discharge: 2015-05-07 | Disposition: A | Discharge status: Home / Self Care | Payer: Medicare Other | Source: Ambulatory Visit | Attending: Neurosurgery | Admitting: Neurosurgery

## 2015-05-06 DIAGNOSIS — Z79899 Other long term (current) drug therapy: Secondary | ICD-10-CM | POA: Insufficient documentation

## 2015-05-06 DIAGNOSIS — M4854XA Collapsed vertebra, not elsewhere classified, thoracic region, initial encounter for fracture: Secondary | ICD-10-CM | POA: Diagnosis not present

## 2015-05-06 DIAGNOSIS — Z886 Allergy status to analgesic agent status: Secondary | ICD-10-CM | POA: Insufficient documentation

## 2015-05-06 DIAGNOSIS — Z419 Encounter for procedure for purposes other than remedying health state, unspecified: Secondary | ICD-10-CM

## 2015-05-06 DIAGNOSIS — Z96642 Presence of left artificial hip joint: Secondary | ICD-10-CM | POA: Insufficient documentation

## 2015-05-06 DIAGNOSIS — I10 Essential (primary) hypertension: Secondary | ICD-10-CM | POA: Insufficient documentation

## 2015-05-06 DIAGNOSIS — Z7982 Long term (current) use of aspirin: Secondary | ICD-10-CM | POA: Insufficient documentation

## 2015-05-06 DIAGNOSIS — M1990 Unspecified osteoarthritis, unspecified site: Secondary | ICD-10-CM | POA: Insufficient documentation

## 2015-05-06 DIAGNOSIS — Z87891 Personal history of nicotine dependence: Secondary | ICD-10-CM | POA: Insufficient documentation

## 2015-05-06 DIAGNOSIS — D509 Iron deficiency anemia, unspecified: Secondary | ICD-10-CM | POA: Diagnosis not present

## 2015-05-06 DIAGNOSIS — S22080A Wedge compression fracture of T11-T12 vertebra, initial encounter for closed fracture: Secondary | ICD-10-CM | POA: Diagnosis present

## 2015-05-06 DIAGNOSIS — Z9889 Other specified postprocedural states: Secondary | ICD-10-CM | POA: Diagnosis not present

## 2015-05-06 DIAGNOSIS — Z96653 Presence of artificial knee joint, bilateral: Secondary | ICD-10-CM | POA: Insufficient documentation

## 2015-05-06 DIAGNOSIS — M8008XA Age-related osteoporosis with current pathological fracture, vertebra(e), initial encounter for fracture: Secondary | ICD-10-CM | POA: Diagnosis not present

## 2015-05-06 DIAGNOSIS — K219 Gastro-esophageal reflux disease without esophagitis: Secondary | ICD-10-CM | POA: Diagnosis not present

## 2015-05-06 HISTORY — PX: KYPHOPLASTY: SHX5884

## 2015-05-06 SURGERY — KYPHOPLASTY
Anesthesia: General | Site: Back

## 2015-05-06 MED ORDER — METOPROLOL TARTRATE 50 MG PO TABS
50.0000 mg | ORAL_TABLET | Freq: Every morning | ORAL | Status: DC
  Administered 2015-05-06 – 2015-05-07 (×2): 50 mg via ORAL
  Filled 2015-05-06 (×2): qty 1

## 2015-05-06 MED ORDER — FENTANYL CITRATE (PF) 100 MCG/2ML IJ SOLN
INTRAMUSCULAR | Status: DC | PRN
  Administered 2015-05-06 (×2): 50 ug via INTRAVENOUS

## 2015-05-06 MED ORDER — METOPROLOL TARTRATE 25 MG PO TABS
25.0000 mg | ORAL_TABLET | Freq: Every day | ORAL | Status: DC
  Administered 2015-05-06: 25 mg via ORAL
  Filled 2015-05-06: qty 1

## 2015-05-06 MED ORDER — SODIUM CHLORIDE 0.9 % IV SOLN: 250.0000 mL | INTRAVENOUS | Status: DC

## 2015-05-06 MED ORDER — SODIUM CHLORIDE 0.9 % IJ SOLN: 3.0000 mL | INTRAMUSCULAR | Status: DC | PRN

## 2015-05-06 MED ORDER — ALPRAZOLAM 0.25 MG PO TABS: 0.2500 mg | ORAL_TABLET | Freq: Every evening | ORAL | Status: DC | PRN

## 2015-05-06 MED ORDER — VENLAFAXINE HCL ER 75 MG PO CP24
75.0000 mg | ORAL_CAPSULE | Freq: Every day | ORAL | Status: DC
  Administered 2015-05-06: 75 mg via ORAL
  Filled 2015-05-06: qty 1

## 2015-05-06 MED ORDER — PHENYLEPHRINE HCL 10 MG/ML IJ SOLN
INTRAMUSCULAR | Status: DC | PRN
  Administered 2015-05-06 (×3): 40 ug via INTRAVENOUS

## 2015-05-06 MED ORDER — EPHEDRINE SULFATE 50 MG/ML IJ SOLN
INTRAMUSCULAR | Status: DC | PRN
  Administered 2015-05-06: 5 mg via INTRAVENOUS
  Administered 2015-05-06 (×3): 10 mg via INTRAVENOUS

## 2015-05-06 MED ORDER — METOPROLOL TARTRATE 25 MG PO TABS: 25.0000 mg | ORAL_TABLET | Freq: Two times a day (BID) | ORAL | Status: DC

## 2015-05-06 MED ORDER — BUPIVACAINE HCL (PF) 0.25 % IJ SOLN
INTRAMUSCULAR | Status: DC | PRN
  Administered 2015-05-06: 6 mL

## 2015-05-06 MED ORDER — MENTHOL 3 MG MT LOZG: 1.0000 | LOZENGE | OROMUCOSAL | Status: DC | PRN

## 2015-05-06 MED ORDER — KETOROLAC TROMETHAMINE 30 MG/ML IJ SOLN
INTRAMUSCULAR | Status: AC
  Filled 2015-05-06: qty 1

## 2015-05-06 MED ORDER — CYCLOBENZAPRINE HCL 10 MG PO TABS: 10.0000 mg | ORAL_TABLET | Freq: Three times a day (TID) | ORAL | Status: DC | PRN

## 2015-05-06 MED ORDER — KCL IN DEXTROSE-NACL 20-5-0.45 MEQ/L-%-% IV SOLN
INTRAVENOUS | Status: DC
  Administered 2015-05-06 (×2): via INTRAVENOUS
  Filled 2015-05-06 (×4): qty 1000

## 2015-05-06 MED ORDER — BISACODYL 10 MG RE SUPP: 10.0000 mg | Freq: Every day | RECTAL | Status: DC | PRN

## 2015-05-06 MED ORDER — MAGNESIUM HYDROXIDE 400 MG/5ML PO SUSP: 30.0000 mL | Freq: Every day | ORAL | Status: DC | PRN

## 2015-05-06 MED ORDER — OXYCODONE-ACETAMINOPHEN 5-325 MG PO TABS: 1.0000 | ORAL_TABLET | ORAL | Status: DC | PRN

## 2015-05-06 MED ORDER — ACETAMINOPHEN 325 MG PO TABS: 650.0000 mg | ORAL_TABLET | ORAL | Status: DC | PRN

## 2015-05-06 MED ORDER — HYDROXYZINE HCL 50 MG/ML IM SOLN
50.0000 mg | INTRAMUSCULAR | Status: DC | PRN
  Filled 2015-05-06: qty 1

## 2015-05-06 MED ORDER — LACTATED RINGERS IV SOLN
INTRAVENOUS | Status: DC | PRN
  Administered 2015-05-06: 08:00:00 via INTRAVENOUS

## 2015-05-06 MED ORDER — FENTANYL CITRATE (PF) 100 MCG/2ML IJ SOLN: 25.0000 ug | INTRAMUSCULAR | Status: DC | PRN

## 2015-05-06 MED ORDER — ACYCLOVIR 200 MG PO CAPS
800.0000 mg | ORAL_CAPSULE | Freq: Every morning | ORAL | Status: DC
  Administered 2015-05-06 – 2015-05-07 (×2): 800 mg via ORAL
  Filled 2015-05-06 (×2): qty 4

## 2015-05-06 MED ORDER — PHENOL 1.4 % MT LIQD: 1.0000 | OROMUCOSAL | Status: DC | PRN

## 2015-05-06 MED ORDER — ONDANSETRON HCL 4 MG PO TABS: 4.0000 mg | ORAL_TABLET | Freq: Four times a day (QID) | ORAL | Status: DC | PRN

## 2015-05-06 MED ORDER — ACYCLOVIR 200 MG PO CAPS
400.0000 mg | ORAL_CAPSULE | Freq: Every evening | ORAL | Status: DC
  Filled 2015-05-06 (×2): qty 2

## 2015-05-06 MED ORDER — ROSUVASTATIN CALCIUM 10 MG PO TABS
10.0000 mg | ORAL_TABLET | Freq: Every day | ORAL | Status: DC
  Administered 2015-05-06 – 2015-05-07 (×2): 10 mg via ORAL
  Filled 2015-05-06 (×2): qty 1

## 2015-05-06 MED ORDER — ACETAMINOPHEN 650 MG RE SUPP: 650.0000 mg | RECTAL | Status: DC | PRN

## 2015-05-06 MED ORDER — DOCUSATE SODIUM 100 MG PO CAPS: 100.0000 mg | ORAL_CAPSULE | Freq: Every day | ORAL | Status: DC | PRN

## 2015-05-06 MED ORDER — KETOROLAC TROMETHAMINE 30 MG/ML IJ SOLN
30.0000 mg | Freq: Four times a day (QID) | INTRAMUSCULAR | Status: DC
  Administered 2015-05-06 – 2015-05-07 (×4): 30 mg via INTRAVENOUS
  Filled 2015-05-06 (×4): qty 1

## 2015-05-06 MED ORDER — 0.9 % SODIUM CHLORIDE (POUR BTL) OPTIME
TOPICAL | Status: DC | PRN
  Administered 2015-05-06: 1000 mL

## 2015-05-06 MED ORDER — PROPOFOL 10 MG/ML IV BOLUS
INTRAVENOUS | Status: DC | PRN
  Administered 2015-05-06: 130 mg via INTRAVENOUS

## 2015-05-06 MED ORDER — LIDOCAINE HCL (CARDIAC) 20 MG/ML IV SOLN
INTRAVENOUS | Status: DC | PRN
  Administered 2015-05-06: 60 mg via INTRAVENOUS

## 2015-05-06 MED ORDER — VITAMIN D3 25 MCG (1000 UNIT) PO TABS
2000.0000 [IU] | ORAL_TABLET | Freq: Every day | ORAL | Status: DC
  Administered 2015-05-06 – 2015-05-07 (×2): 2000 [IU] via ORAL
  Filled 2015-05-06 (×3): qty 2

## 2015-05-06 MED ORDER — KETOROLAC TROMETHAMINE 30 MG/ML IJ SOLN
30.0000 mg | Freq: Once | INTRAMUSCULAR | Status: AC
  Administered 2015-05-06: 30 mg via INTRAVENOUS

## 2015-05-06 MED ORDER — LIDOCAINE-EPINEPHRINE 1 %-1:100000 IJ SOLN
INTRAMUSCULAR | Status: DC | PRN
  Administered 2015-05-06: 6 mL via INTRADERMAL

## 2015-05-06 MED ORDER — ONDANSETRON HCL 4 MG/2ML IJ SOLN: 4.0000 mg | Freq: Four times a day (QID) | INTRAMUSCULAR | Status: DC | PRN

## 2015-05-06 MED ORDER — ACETAMINOPHEN 10 MG/ML IV SOLN
INTRAVENOUS | Status: AC
  Administered 2015-05-06: 1000 mg via INTRAVENOUS
  Filled 2015-05-06: qty 100

## 2015-05-06 MED ORDER — HYDROCODONE-ACETAMINOPHEN 5-325 MG PO TABS: 1.0000 | ORAL_TABLET | ORAL | Status: DC | PRN

## 2015-05-06 MED ORDER — CEFAZOLIN SODIUM-DEXTROSE 2-3 GM-% IV SOLR
2.0000 g | INTRAVENOUS | Status: AC
  Administered 2015-05-06: 2 g via INTRAVENOUS
  Filled 2015-05-06: qty 50

## 2015-05-06 MED ORDER — FERROUS SULFATE 325 (65 FE) MG PO TABS
325.0000 mg | ORAL_TABLET | Freq: Every day | ORAL | Status: DC
  Administered 2015-05-06 – 2015-05-07 (×2): 325 mg via ORAL
  Filled 2015-05-06 (×2): qty 1

## 2015-05-06 MED ORDER — SODIUM CHLORIDE 0.9 % IJ SOLN
3.0000 mL | Freq: Two times a day (BID) | INTRAMUSCULAR | Status: DC
  Administered 2015-05-06: 3 mL via INTRAVENOUS

## 2015-05-06 MED ORDER — PROPOFOL 10 MG/ML IV BOLUS
INTRAVENOUS | Status: AC
  Filled 2015-05-06: qty 20

## 2015-05-06 MED ORDER — FENTANYL CITRATE (PF) 250 MCG/5ML IJ SOLN
INTRAMUSCULAR | Status: AC
  Filled 2015-05-06: qty 5

## 2015-05-06 MED ORDER — SUCCINYLCHOLINE CHLORIDE 20 MG/ML IJ SOLN
INTRAMUSCULAR | Status: DC | PRN
  Administered 2015-05-06: 90 mg via INTRAVENOUS

## 2015-05-06 MED ORDER — ACYCLOVIR 400 MG PO TABS: 400.0000 mg | ORAL_TABLET | Freq: Two times a day (BID) | ORAL | Status: DC

## 2015-05-06 MED ORDER — SODIUM CHLORIDE 0.9 % IV SOLN
INTRAVENOUS | Status: DC | PRN
  Administered 2015-05-06: 50 mL

## 2015-05-06 MED ORDER — ALUM & MAG HYDROXIDE-SIMETH 200-200-20 MG/5ML PO SUSP: 30.0000 mL | Freq: Four times a day (QID) | ORAL | Status: DC | PRN

## 2015-05-06 MED ORDER — ONDANSETRON HCL 4 MG/2ML IJ SOLN
INTRAMUSCULAR | Status: DC | PRN
  Administered 2015-05-06: 4 mg via INTRAVENOUS

## 2015-05-06 MED ORDER — HYDROXYZINE HCL 25 MG PO TABS: 50.0000 mg | ORAL_TABLET | ORAL | Status: DC | PRN

## 2015-05-06 SURGICAL SUPPLY — 45 items
ADH SKN CLS APL DERMABOND .7 (GAUZE/BANDAGES/DRESSINGS) ×1
BANDAGE ADH SHEER 1  50/CT (GAUZE/BANDAGES/DRESSINGS) ×8 IMPLANT
BLADE CLIPPER SURG (BLADE) IMPLANT
BLADE SURG 11 STRL SS (BLADE) ×2 IMPLANT
CEMENT BONE KYPHX HV R (Orthopedic Implant) ×1 IMPLANT
CEMENT KYPHON C01A KIT/MIXER (Cement) ×1 IMPLANT
DECANTER SPIKE VIAL GLASS SM (MISCELLANEOUS) ×2 IMPLANT
DERMABOND ADVANCED (GAUZE/BANDAGES/DRESSINGS) ×1
DERMABOND ADVANCED .7 DNX12 (GAUZE/BANDAGES/DRESSINGS) IMPLANT
DRAPE C-ARM 42X72 X-RAY (DRAPES) ×2 IMPLANT
DRAPE INCISE IOBAN 66X45 STRL (DRAPES) ×2 IMPLANT
DRAPE LAPAROTOMY 100X72X124 (DRAPES) ×2 IMPLANT
DRAPE PROXIMA HALF (DRAPES) ×2 IMPLANT
DURAPREP 26ML APPLICATOR (WOUND CARE) ×2 IMPLANT
GAUZE SPONGE 4X4 16PLY XRAY LF (GAUZE/BANDAGES/DRESSINGS) ×2 IMPLANT
GLOVE BIO SURGEON STRL SZ 6.5 (GLOVE) ×1 IMPLANT
GLOVE BIO SURGEON STRL SZ7 (GLOVE) ×1 IMPLANT
GLOVE BIOGEL PI IND STRL 8 (GLOVE) ×1 IMPLANT
GLOVE BIOGEL PI INDICATOR 8 (GLOVE) ×1
GLOVE ECLIPSE 7.5 STRL STRAW (GLOVE) ×2 IMPLANT
GLOVE EXAM NITRILE LRG STRL (GLOVE) IMPLANT
GLOVE EXAM NITRILE MD LF STRL (GLOVE) IMPLANT
GLOVE EXAM NITRILE XL STR (GLOVE) IMPLANT
GLOVE EXAM NITRILE XS STR PU (GLOVE) IMPLANT
GOWN STRL REUS W/ TWL LRG LVL3 (GOWN DISPOSABLE) IMPLANT
GOWN STRL REUS W/ TWL XL LVL3 (GOWN DISPOSABLE) IMPLANT
GOWN STRL REUS W/TWL 2XL LVL3 (GOWN DISPOSABLE) IMPLANT
GOWN STRL REUS W/TWL LRG LVL3 (GOWN DISPOSABLE) ×2
GOWN STRL REUS W/TWL XL LVL3 (GOWN DISPOSABLE)
KIT BASIN OR (CUSTOM PROCEDURE TRAY) ×2 IMPLANT
KIT ROOM TURNOVER OR (KITS) ×2 IMPLANT
KYPHON MIXER ×1 IMPLANT
NDL HYPO 25X1 1.5 SAFETY (NEEDLE) ×1 IMPLANT
NEEDLE HYPO 25X1 1.5 SAFETY (NEEDLE) ×2 IMPLANT
NS IRRIG 1000ML POUR BTL (IV SOLUTION) ×2 IMPLANT
PACK SURGICAL SETUP 50X90 (CUSTOM PROCEDURE TRAY) ×2 IMPLANT
PAD ARMBOARD 7.5X6 YLW CONV (MISCELLANEOUS) ×6 IMPLANT
SPECIMEN JAR SMALL (MISCELLANEOUS) IMPLANT
SUT VIC AB 3-0 SH 8-18 (SUTURE) ×2 IMPLANT
SUT VIC AB 4-0 P-3 18X BRD (SUTURE) ×1 IMPLANT
SUT VIC AB 4-0 P3 18 (SUTURE) ×2
SYR CONTROL 10ML LL (SYRINGE) ×4 IMPLANT
TOWEL OR 17X24 6PK STRL BLUE (TOWEL DISPOSABLE) ×2 IMPLANT
TOWEL OR 17X26 10 PK STRL BLUE (TOWEL DISPOSABLE) ×2 IMPLANT
TRAY KYPHOPAK 20/3 ONESTEP 1ST (MISCELLANEOUS) ×1 IMPLANT

## 2015-05-06 NOTE — Anesthesia Postprocedure Evaluation (Signed)
  Anesthesia Post-op Note  Patient: Kristin Solomon  Procedure(s) Performed: Procedure(s) with comments: KYPHOPLASTY Thoracic twelve (N/A) - T12 Kyphoplasty  Patient Location: PACU  Anesthesia Type:General  Level of Consciousness: awake and alert   Airway and Oxygen Therapy: Patient Spontanous Breathing  Post-op Pain: mild  Post-op Assessment: Post-op Vital signs reviewed LLE Motor Response: Purposeful movement LLE Sensation: Full sensation RLE Motor Response: Purposeful movement RLE Sensation: Full sensation      Post-op Vital Signs: stable  Last Vitals:  Filed Vitals:   05/06/15 0930  BP: 134/56  Pulse: 77  Temp: 36.6 C  Resp: 21    Complications: No apparent anesthesia complications

## 2015-05-06 NOTE — Op Note (Signed)
05/06/2015  8:44 AM  PATIENT:  Kristin Solomon  80 y.o. female  PRE-OPERATIVE DIAGNOSIS:  T12 compression fracture  POST-OPERATIVE DIAGNOSIS:  T12 compression fracture  PROCEDURE:  Procedure(s):  KYPHOPLASTY Thoracic twelve  SURGEON:  Surgeon(s): Jovita Gamma, MD  ANESTHESIA:   general  EBL:   nil  COUNT:Correct per nursing staff  DICTATION: Patient was brought to the operating room, placed under general endotracheal anesthesia. AP and lateral C-arm fluoroscopy units were set up, and the T12 vertebra identified. The thoracolumbar region posteriorly was prepped with DuraPrep, and draped in a sterile fashion. The C-arm fluoroscopy units were also draped in a sterile fashion. The entry points to approach the posterior lateral aspect of the T12 pedicles were identified bilaterally.  The skin and subcutaneous tissue were infiltrated with local anesthetic with epinephrine. 3 mm incisions were made on either side at the entry points. The cannulated trocar was passed down to the posterior lateral entry point of the pedicles, on each side. Each trocar was passed through the pedicle with C-arm fluoroscopic guidance into the vertebral body on either side. We then removed the inner trocar, leaving the outer cannula. Using the drill we created a passage through the vertebral body, to the ventral wall. The balloon expanders were placed in the vertebral body on either side, and gently inflated and expanded under C-arm fluoroscopic guidance. Once good compaction was achieved bilaterally, the balloon expanders were deflated, and we began to fill the cavities that had been created with methylmethacrylate. We filled from one side to the other, using C-arm fluoroscopic guidance, until good filling of the cavity, and good interdigitation with the bone was achieved bilaterally. A total of 3 cc of methylmethacrylate was introduced on the right side, and 1 cc of methylmethacrylate was introduced on the left  side, for a total of 4 cc. We then carefully removed each of the cannulas, using C-arm for guidance. Final imaging showed good filling of the cavity and good interdigitation within the vertebral body trabeculae. Each of the incisions was closed with a single 3-0 undyed Vicryl subcuticular suture, and the Dermabond. Following surgery the patient was turned back to a supine position, to be reversed and the anesthetic, extubated, and transferred to the recovery room for further care.   PLAN OF CARE: Admit for overnight observation  PATIENT DISPOSITION:  PACU - hemodynamically stable.   Delay start of Pharmacological VTE agent (>24hrs) due to surgical blood loss or risk of bleeding:  yes

## 2015-05-06 NOTE — H&P (Signed)
Subjective: Patient is a 81 y.o. right-handed white female who is admitted for treatment of T12 compression fracture.  Patient is a little over 3 to half weeks status post a kyphoplasty for T11 compression fracture. Postoperatively she developed recurrent pain. Describing pain in the right side of her low back. X-rays were done while I was out of town, but the pain persisted and we saw back in the office earlier this week, repeat x-rays showed interval development of a T12 compression fracture, the patient admitted now for T12 kyphoplasty.    Patient Active Problem List   Diagnosis Date Noted  . Wedge compression fracture of T11 vertebra ([place]) 04/10/2015  . Left hip pain 07/13/2014  . GERD (gastroesophageal reflux disease) 07/13/2014  . Dry mouth 07/13/2014  . 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 Gait disorder 04/21/2014  . Constipation 06/21/2013  . Depression with anxiety 06/21/2013  . Diverticulosis of colon without hemorrhage 06/13/2013  . S/P Nissen fundoplication (without gastrostomy tube) procedure 06/13/2013  . Lactose intolerance 06/13/2013  . PVCs (premature ventricular contractions) 06/13/2013  . Fracture of femoral neck, left ([place]) 06/12/2013  . Hip fracture requiring operative repair ([place]) 06/12/2013  . Closed left hip fracture ([place]) 06/12/2013  . Leukocytosis, unspecified 06/12/2013  . Postoperative anemia due to acute blood loss 12/25/2011  . Lumbar radiculopathy, chronic 12/23/2011  . Hyperlipidemia   . Hypertension   . Tachycardia   . Herpes ocular   . Anemia, iron deficiency   . Right knee DJD   . Status post total knee replacement    Past Medical History  Diagnosis Date  . Herpes ocular     history of-takes Acyclovir daily  . High cholesterol     takes Atorvastatin daily  . History of hiatal hernia   . Osteoarthritis of knee     bilateral  . Sciatic pain   . Gait disorder 04/21/2014  . Insomnia     takes Xanax nightly  . Anemia, iron deficiency      takes Ferrous Sulfate daily  . Depression     takes Effexor daily  . GERD (gastroesophageal reflux disease)     takes Omeprazole daily  . Hypertension     takes Metoprolol daily    Past Surgical History  Procedure Laterality Date  . Lumbar laminectomy/decompression microdiscectomy  08/29/2010    L2-S1  . Shoulder arthroscopy with rotator cuff repair and subacromial decompression Right 01/01/2000  . Total knee arthroplasty Left 05/14/2004  . Abdominal hysterectomy  1995    partial  . Laparoscopic nissen fundoplication  [id number]  . Descemets stripping automated endothelial keratoplasty Left 03/14/2011  . Tonsillectomy      as child  . Colonoscopy    . Total knee arthroplasty  12/23/2011    Procedure: TOTAL KNEE ARTHROPLASTY;  Surgeon: Lorn Junes, MD;  Location: [facility];  Service: Orthopedics;  Laterality: Right;  [place] THIS CASE  . Knee arthroscopy Right 05/11/2001  . Trigger finger release Right 07/21/2007    thumb  . Carpal tunnel release Right 07/21/2007  . Trigger finger release Left 09/24/2007    middle finger  . Carpal tunnel release Left 09/24/2007  . Trigger finger release Right 03/10/2008    ring and little fingers  . Descemets stripping automated endothelial keratoplasty Right 08/20/2012  . Trigger finger release Right 09/02/2012    Procedure: RELEASE TRIGGER FINGER/A-1 PULLEY RIGHT INDEX FINGER;  Surgeon: Wynonia Sours, MD;  Location: [facility];  Service: Orthopedics;  Laterality: Right;  . Trigger finger release Left 12/22/2012    Procedure: RELEASE TRIGGER FINGER/A-1 PULLEY LEFT RING FINGER;  Surgeon: Wynonia Sours, MD;  Location: [facility];  Service: Orthopedics;  Laterality: Left;  Left   . Joint replacement      bilateral knees, left knee  . Hip arthroplasty Left 06/13/2013    Procedure: ARTHROPLASTY BIPOLAR HIP; Injection left shoulder;  Surgeon: Johnny Bridge, MD;  Location: [facility];  Service: Orthopedics;  Laterality:  Left;  . Hernia repair    . Total hip revision Left 07/08/2014    Procedure: TOTAL HIP REVISION;  Surgeon: Kerin Salen, MD;  Location: [facility];  Service: Orthopedics;  Laterality: Left;  [facility] Eye surgery Bilateral     cataract surgery  . Eye surgery Left     corneal transplant  . Kyphoplasty N/A 04/10/2015    Procedure: T11 Kyphoplasty;  Surgeon: Jovita Gamma, MD;  Location: [facility];  Service: Neurosurgery;  Laterality: N/A;  T11 Kyphoplasty    Prescriptions prior to admission  Medication Sig Dispense Refill Last Dose  . acetaminophen (TYLENOL) 500 MG tablet Take 500 mg by mouth every 6 (six) hours as needed for pain.   Past Week at Unknown time  . acyclovir (ZOVIRAX) 400 MG tablet Take 400-800 mg by mouth 2 (two) times daily. 800mg  in the morning, 400mg  in the evening   05/05/2015 at Unknown time  . aspirin EC 81 MG tablet Take 81 mg by mouth daily.   05/04/2015  . atorvastatin (LIPITOR) 20 MG tablet Take 20 mg by mouth daily.   04/09/2015 at Unknown time  . cholecalciferol (VITAMIN D) 1000 UNITS tablet Take 2,000 Units by mouth daily.   05/05/2015 at Unknown time  . docusate sodium (COLACE) 100 MG capsule Take 1 capsule by mouth daily as needed for mild constipation.    Past Month at Unknown time  . ferrous sulfate 325 (65 FE) MG tablet Take 325 mg by mouth daily.   05/05/2015 at Unknown time  . metoprolol (LOPRESSOR) 50 MG tablet Take 25-50 mg by mouth 2 (two) times daily. Take 50 MG in the morning and take 25 MG at bedtime.   05/06/2015 at 0530  . omeprazole (PRILOSEC) 40 MG capsule Take 40 mg by mouth 2 (two) times daily.    05/06/2015 at Unknown time  . rosuvastatin (CRESTOR) 10 MG tablet Take 10 mg by mouth daily.   05/06/2015 at Unknown time  . venlafaxine XR (EFFEXOR-XR) 75 MG 24 hr capsule Take 75 mg by mouth at bedtime.    05/05/2015 at Unknown time  . ALPRAZolam (XANAX) 0.5 MG tablet Take 0.25 mg by mouth at bedtime as needed for sleep.   More than a month at Unknown time    Allergies  Allergen Reactions  . Morphine And Related Other (See Comments)    AGITATION, STRANGE DREAMS  . Scopace [Scopolamine] Other (See Comments)    MENTAL CHANGES  . Nsaids Other (See Comments)    On paperwork from facility  . Pravachol [Pravastatin Sodium] Other (See Comments)    On paperwork from facility    Social History  Substance Use Topics  . Smoking status: Former Research scientist (life sciences)  . Smokeless tobacco: Never Used     Comment: quit smoking 20-30 yr. ago  . Alcohol Use: No    Family History  Problem Relation Age of Onset  . Heart attack Mother   . Parkinsonism Father   . Diabetes  Brother      Review of Systems A comprehensive review of systems was negative.  Objective: Vital signs in last 24 hours: Temp:  [97.6 F (36.4 C)-98.1 F (36.7 C)] 97.6 F (36.4 C) (11/12 0637) Pulse Rate:  [60-74] 60 (11/12 0637) Resp:  [18] 18 (11/12 0637) BP: (136-162)/(53-62) 162/53 mmHg (11/12 0637) SpO2:  [97 %-99 %] 99 % (11/12 0637) Weight:  [62.959 kg (138 lb 12.8 oz)] 62.959 kg (138 lb 12.8 oz) (11/12 [id number])  EXAM: Patient well-developed, well-nourished white female in no acute distress. Lungs are clear to auscultation , the patient has symmetrical respiratory excursion. Heart has a regular rate and rhythm normal S1 and S2 no murmur.   Abdomen is soft nontender nondistended bowel sounds are present. Extremity examination shows no clubbing cyanosis or edema. Motor examination shows 5 over 5 strength in the lower extremities including the iliopsoas quadriceps dorsiflexor extensor hallicus  longus and plantar flexor bilaterally. Sensation is intact to pinprick in the distal lower extremities. Reflexes are symmetrical bilaterally. No pathologic reflexes are present. Patient has a normal gait and stance.   Data Review:CBC    Component Value Date/Time   WBC 5.5 05/05/2015 0924   WBC 6.3 07/19/2014   RBC 3.83* 05/05/2015 0924   HGB 12.1 05/05/2015 0924   HCT 36.4 05/05/2015 0924    PLT 247 05/05/2015 0924   MCV 95.0 05/05/2015 0924   MCH 31.6 05/05/2015 0924   MCHC 33.2 05/05/2015 0924   RDW 12.9 05/05/2015 0924   LYMPHSABS 2.2 03/15/2015 2343   MONOABS 1.4* 03/15/2015 2343   EOSABS 0.0 03/15/2015 2343   BASOSABS 0.0 03/15/2015 2343                          BMET    Component Value Date/Time   NA 137 05/05/2015 0924   K 4.3 05/05/2015 0924   CL 106 05/05/2015 0924   CO2 23 05/05/2015 0924   GLUCOSE 99 05/05/2015 0924   BUN 8 05/05/2015 0924   CREATININE 0.68 05/05/2015 0924   CALCIUM 9.2 05/05/2015 0924   GFRNONAA >60 05/05/2015 0924   GFRAA >60 05/05/2015 0924     Assessment/Plan: Patient with a acute/subacute T12 compression fracture, 3/2 weeks status post a kyphoplasty for a T11 compression fracture. The T12 fracture occurred to the postoperative period, the patient is having back pain. In the lower right lumbar region. She is admitted now for a T12 kyphoplasty.I've discussed with the patient the nature of his condition, the nature the surgical procedure, the typical length of surgery, hospital stay, and overall recuperation. We discussed limitations postoperatively. I discussed risks of surgery including risks of infection, bleeding, possibly need for transfusion, the risk of nerve root dysfunction with pain, weakness, numbness, or paresthesias, or risk of dural tear and CSF leakage and possible need for further surgery, and the risk of anesthetic complications including myocardial infarction, stroke, pneumonia, and death. Understanding all this the patient does wish to proceed with surgery and is admitted for such.    Hosie Spangle, MD 05/06/2015 7:25 AM

## 2015-05-06 NOTE — Transfer of Care (Signed)
Immediate Anesthesia Transfer of Care Note  Patient: Kristin Solomon  Procedure(s) Performed: Procedure(s) with comments: KYPHOPLASTY Thoracic twelve (N/A) - T12 Kyphoplasty  Patient Location: PACU  Anesthesia Type:General  Level of Consciousness: awake  Airway & Oxygen Therapy: Patient Spontanous Breathing and Patient connected to nasal cannula oxygen  Post-op Assessment: Report given to RN, Post -op Vital signs reviewed and stable and Patient moving all extremities  Post vital signs: Reviewed and stable  Last Vitals:  Filed Vitals:   05/06/15 0637  BP: 162/53  Pulse: 60  Temp: 36.4 C  Resp: 18    Complications: No apparent anesthesia complications

## 2015-05-06 NOTE — Anesthesia Procedure Notes (Signed)
Procedure Name: Intubation Date/Time: 05/06/2015 7:54 AM Performed by: Suzy Bouchard Pre-anesthesia Checklist: Patient identified, Emergency Drugs available, Suction available, Timeout performed and Patient being monitored Patient Re-evaluated:Patient Re-evaluated prior to inductionOxygen Delivery Method: Circle system utilized Preoxygenation: Pre-oxygenation with 100% oxygen Intubation Type: IV induction Ventilation: Mask ventilation without difficulty Laryngoscope Size: Glidescope and 3 Grade View: Grade I Tube type: Oral Laser Tube: Cuffed inflated with minimal occlusive pressure - saline Tube size: 7.0 mm Airway Equipment and Method: Stylet and Video-laryngoscopy Placement Confirmation: ETT inserted through vocal cords under direct vision,  positive ETCO2 and breath sounds checked- equal and bilateral Tube secured with: Tape Dental Injury: Teeth and Oropharynx as per pre-operative assessment  Difficulty Due To: Difficulty was anticipated, Difficult Airway- due to limited oral opening and Difficult Airway- due to anterior larynx Future Recommendations: Recommend- induction with short-acting agent, and alternative techniques readily available

## 2015-05-06 NOTE — Anesthesia Preprocedure Evaluation (Addendum)
Anesthesia Evaluation  Patient identified by MRN, date of birth, ID band Patient awake    Reviewed: Allergy & Precautions, NPO status , Patient's Chart, lab work & pertinent test results, reviewed documented beta blocker date and time   History of Anesthesia Complications Negative for: history of anesthetic complications  Airway Mallampati: IV  TM Distance: <3 FB Neck ROM: Full  Mouth opening: Limited Mouth Opening  Dental  (+) Partial Upper, Partial Lower, Dental Advisory Given   Pulmonary former smoker,    breath sounds clear to auscultation       Cardiovascular hypertension, Pt. on home beta blockers  Rhythm:Regular Rate:Normal     Neuro/Psych  Neuromuscular disease    GI/Hepatic Neg liver ROS, hiatal hernia, GERD  ,  Endo/Other  negative endocrine ROS  Renal/GU negative Renal ROS     Musculoskeletal  (+) Arthritis ,   Abdominal   Peds  Hematology  (+) anemia ,   Anesthesia Other Findings   Reproductive/Obstetrics                           Anesthesia Physical Anesthesia Plan  ASA: II  Anesthesia Plan: General   Post-op Pain Management:    Induction: Intravenous  Airway Management Planned: Oral ETT and Video Laryngoscope Planned  Additional Equipment:   Intra-op Plan:   Post-operative Plan: Extubation in OR  Informed Consent: I have reviewed the patients History and Physical, chart, labs and discussed the procedure including the risks, benefits and alternatives for the proposed anesthesia with the patient or authorized representative who has indicated his/her understanding and acceptance.   Dental advisory given  Plan Discussed with: CRNA and Surgeon  Anesthesia Plan Comments:        Anesthesia Quick Evaluation

## 2015-05-07 DIAGNOSIS — M4854XA Collapsed vertebra, not elsewhere classified, thoracic region, initial encounter for fracture: Secondary | ICD-10-CM | POA: Diagnosis not present

## 2015-05-07 DIAGNOSIS — I10 Essential (primary) hypertension: Secondary | ICD-10-CM | POA: Diagnosis not present

## 2015-05-07 DIAGNOSIS — Z79899 Other long term (current) drug therapy: Secondary | ICD-10-CM | POA: Diagnosis not present

## 2015-05-07 DIAGNOSIS — Z7982 Long term (current) use of aspirin: Secondary | ICD-10-CM | POA: Diagnosis not present

## 2015-05-07 DIAGNOSIS — Z96653 Presence of artificial knee joint, bilateral: Secondary | ICD-10-CM | POA: Diagnosis not present

## 2015-05-07 DIAGNOSIS — Z96642 Presence of left artificial hip joint: Secondary | ICD-10-CM | POA: Diagnosis not present

## 2015-05-07 MED ORDER — HYDROCODONE-ACETAMINOPHEN 5-325 MG PO TABS: 1.0000 | ORAL_TABLET | ORAL | Status: DC | PRN

## 2015-05-07 NOTE — Discharge Summary (Signed)
Physician Discharge Summary  Patient ID: Kristin Solomon MRN: [id number] DOB/AGE: 79-Jan-1931 79 y.o.  Admit date: 05/06/2015 Discharge date: 05/07/2015  Admission Diagnoses:  T12 compression fracture  Discharge Diagnoses:  T12 compression fracture Active Problems:   T12 compression fracture [facility])   Discharged Condition: good  Hospital Course: Patient admitted underwent a T12 kyphoplasty. Postoperatively she had excellent relief of her back pain. She is up and ambulating actively. Her incisions are clean and dry, they have no erythema, swelling, or drainage. She and her son have  been given instructions regarding wound care and activitiesFollowing discharge. She is scheduled to return for follow-up with me in the office in about 3 weeks with x-rays.  Discharge Exam: Blood pressure 143/65, pulse 81, temperature 98.1 F (36.7 C), temperature source Oral, resp. rate 20, height 5\' 2"  (1.575 m), weight 62.959 kg (138 lb 12.8 oz), SpO2 93 %.  Disposition: 01-Home or Self Care     Medication List    STOP taking these medications        atorvastatin 20 MG tablet  Commonly known as:  LIPITOR      TAKE these medications        acetaminophen 500 MG tablet  Commonly known as:  TYLENOL  Take 500 mg by mouth every 6 (six) hours as needed for pain.     acyclovir 400 MG tablet  Commonly known as:  ZOVIRAX  Take 400-800 mg by mouth 2 (two) times daily. 800mg  in the morning, 400mg  in the evening     ALPRAZolam 0.5 MG tablet  Commonly known as:  XANAX  Take 0.25 mg by mouth at bedtime as needed for sleep.     aspirin EC 81 MG tablet  Take 81 mg by mouth daily.     cholecalciferol 1000 UNITS tablet  Commonly known as:  VITAMIN D  Take 2,000 Units by mouth daily.     docusate sodium 100 MG capsule  Commonly known as:  COLACE  Take 1 capsule by mouth daily as needed for mild constipation.     ferrous sulfate 325 (65 FE) MG tablet  Take 325 mg by mouth daily.     HYDROcodone-acetaminophen 5-325 MG tablet  Commonly known as:  NORCO/VICODIN  Take 1-2 tablets by mouth every 4 (four) hours as needed (mild pain).     metoprolol 50 MG tablet  Commonly known as:  LOPRESSOR  Take 25-50 mg by mouth 2 (two) times daily. Take 50 MG in the morning and take 25 MG at bedtime.     omeprazole 40 MG capsule  Commonly known as:  PRILOSEC  Take 40 mg by mouth 2 (two) times daily.     rosuvastatin 10 MG tablet  Commonly known as:  CRESTOR  Take 10 mg by mouth daily.     venlafaxine XR 75 MG 24 hr capsule  Commonly known as:  EFFEXOR-XR  Take 75 mg by mouth at bedtime.         SignedHosie Spangle 05/07/2015, 8:53 AM

## 2015-05-08 ENCOUNTER — Encounter (HOSPITAL_COMMUNITY): Payer: Self-pay | Admitting: Neurosurgery

## 2015-05-12 DIAGNOSIS — S22080A Wedge compression fracture of T11-T12 vertebra, initial encounter for closed fracture: Secondary | ICD-10-CM | POA: Diagnosis not present

## 2015-05-24 DIAGNOSIS — M545 Low back pain: Secondary | ICD-10-CM | POA: Diagnosis not present

## 2015-05-24 DIAGNOSIS — M4806 Spinal stenosis, lumbar region: Secondary | ICD-10-CM | POA: Diagnosis not present

## 2015-05-24 DIAGNOSIS — M5136 Other intervertebral disc degeneration, lumbar region: Secondary | ICD-10-CM | POA: Diagnosis not present

## 2015-05-24 DIAGNOSIS — M4854XA Collapsed vertebra, not elsewhere classified, thoracic region, initial encounter for fracture: Secondary | ICD-10-CM | POA: Diagnosis not present

## 2015-05-24 DIAGNOSIS — M4726 Other spondylosis with radiculopathy, lumbar region: Secondary | ICD-10-CM | POA: Diagnosis not present

## 2015-05-24 DIAGNOSIS — S22080A Wedge compression fracture of T11-T12 vertebra, initial encounter for closed fracture: Secondary | ICD-10-CM | POA: Diagnosis not present

## 2015-05-24 DIAGNOSIS — S22070A Wedge compression fracture of T9-T10 vertebra, initial encounter for closed fracture: Secondary | ICD-10-CM | POA: Diagnosis not present

## 2015-05-25 DIAGNOSIS — M4806 Spinal stenosis, lumbar region: Secondary | ICD-10-CM | POA: Diagnosis not present

## 2015-05-25 DIAGNOSIS — M47814 Spondylosis without myelopathy or radiculopathy, thoracic region: Secondary | ICD-10-CM | POA: Diagnosis not present

## 2015-05-25 DIAGNOSIS — S22080A Wedge compression fracture of T11-T12 vertebra, initial encounter for closed fracture: Secondary | ICD-10-CM | POA: Diagnosis not present

## 2015-05-25 DIAGNOSIS — M545 Low back pain: Secondary | ICD-10-CM | POA: Diagnosis not present

## 2015-05-29 DIAGNOSIS — M4806 Spinal stenosis, lumbar region: Secondary | ICD-10-CM | POA: Diagnosis not present

## 2015-05-29 DIAGNOSIS — S22070A Wedge compression fracture of T9-T10 vertebra, initial encounter for closed fracture: Secondary | ICD-10-CM | POA: Diagnosis not present

## 2015-05-29 DIAGNOSIS — M4316 Spondylolisthesis, lumbar region: Secondary | ICD-10-CM | POA: Diagnosis not present

## 2015-05-29 DIAGNOSIS — S22080A Wedge compression fracture of T11-T12 vertebra, initial encounter for closed fracture: Secondary | ICD-10-CM | POA: Diagnosis not present

## 2015-05-31 DIAGNOSIS — S22080A Wedge compression fracture of T11-T12 vertebra, initial encounter for closed fracture: Secondary | ICD-10-CM | POA: Diagnosis not present

## 2015-05-31 DIAGNOSIS — K5900 Constipation, unspecified: Secondary | ICD-10-CM | POA: Diagnosis not present

## 2015-06-12 DIAGNOSIS — M5136 Other intervertebral disc degeneration, lumbar region: Secondary | ICD-10-CM | POA: Diagnosis not present

## 2015-06-12 DIAGNOSIS — M4726 Other spondylosis with radiculopathy, lumbar region: Secondary | ICD-10-CM | POA: Diagnosis not present

## 2015-06-12 DIAGNOSIS — S22080A Wedge compression fracture of T11-T12 vertebra, initial encounter for closed fracture: Secondary | ICD-10-CM | POA: Diagnosis not present

## 2015-06-12 DIAGNOSIS — M4316 Spondylolisthesis, lumbar region: Secondary | ICD-10-CM | POA: Diagnosis not present

## 2015-06-12 DIAGNOSIS — S22070A Wedge compression fracture of T9-T10 vertebra, initial encounter for closed fracture: Secondary | ICD-10-CM | POA: Diagnosis not present

## 2015-06-12 DIAGNOSIS — M4806 Spinal stenosis, lumbar region: Secondary | ICD-10-CM | POA: Diagnosis not present

## 2015-07-14 DIAGNOSIS — M5136 Other intervertebral disc degeneration, lumbar region: Secondary | ICD-10-CM | POA: Diagnosis not present

## 2015-07-14 DIAGNOSIS — M4726 Other spondylosis with radiculopathy, lumbar region: Secondary | ICD-10-CM | POA: Diagnosis not present

## 2015-07-14 DIAGNOSIS — M4316 Spondylolisthesis, lumbar region: Secondary | ICD-10-CM | POA: Diagnosis not present

## 2015-07-14 DIAGNOSIS — S22070A Wedge compression fracture of T9-T10 vertebra, initial encounter for closed fracture: Secondary | ICD-10-CM | POA: Diagnosis not present

## 2015-07-14 DIAGNOSIS — S22080A Wedge compression fracture of T11-T12 vertebra, initial encounter for closed fracture: Secondary | ICD-10-CM | POA: Diagnosis not present

## 2015-07-14 DIAGNOSIS — M4806 Spinal stenosis, lumbar region: Secondary | ICD-10-CM | POA: Diagnosis not present

## 2015-07-20 DIAGNOSIS — S22080A Wedge compression fracture of T11-T12 vertebra, initial encounter for closed fracture: Secondary | ICD-10-CM | POA: Diagnosis not present

## 2015-07-20 DIAGNOSIS — K5900 Constipation, unspecified: Secondary | ICD-10-CM | POA: Diagnosis not present

## 2015-08-28 DIAGNOSIS — D500 Iron deficiency anemia secondary to blood loss (chronic): Secondary | ICD-10-CM | POA: Diagnosis not present

## 2015-09-02 DIAGNOSIS — H40013 Open angle with borderline findings, low risk, bilateral: Secondary | ICD-10-CM | POA: Diagnosis not present

## 2015-09-06 DIAGNOSIS — H52203 Unspecified astigmatism, bilateral: Secondary | ICD-10-CM | POA: Diagnosis not present

## 2015-09-06 DIAGNOSIS — H5203 Hypermetropia, bilateral: Secondary | ICD-10-CM | POA: Diagnosis not present

## 2015-09-15 DIAGNOSIS — M5136 Other intervertebral disc degeneration, lumbar region: Secondary | ICD-10-CM | POA: Diagnosis not present

## 2015-09-15 DIAGNOSIS — S22080A Wedge compression fracture of T11-T12 vertebra, initial encounter for closed fracture: Secondary | ICD-10-CM | POA: Diagnosis not present

## 2015-09-15 DIAGNOSIS — M4316 Spondylolisthesis, lumbar region: Secondary | ICD-10-CM | POA: Diagnosis not present

## 2015-09-15 DIAGNOSIS — M4806 Spinal stenosis, lumbar region: Secondary | ICD-10-CM | POA: Diagnosis not present

## 2015-09-15 DIAGNOSIS — S22070A Wedge compression fracture of T9-T10 vertebra, initial encounter for closed fracture: Secondary | ICD-10-CM | POA: Diagnosis not present

## 2015-09-15 DIAGNOSIS — M4726 Other spondylosis with radiculopathy, lumbar region: Secondary | ICD-10-CM | POA: Diagnosis not present

## 2015-09-27 DIAGNOSIS — S22080A Wedge compression fracture of T11-T12 vertebra, initial encounter for closed fracture: Secondary | ICD-10-CM | POA: Diagnosis not present

## 2015-09-27 DIAGNOSIS — M25511 Pain in right shoulder: Secondary | ICD-10-CM | POA: Diagnosis not present

## 2015-09-27 DIAGNOSIS — D500 Iron deficiency anemia secondary to blood loss (chronic): Secondary | ICD-10-CM | POA: Diagnosis not present

## 2015-09-27 DIAGNOSIS — K5900 Constipation, unspecified: Secondary | ICD-10-CM | POA: Diagnosis not present

## 2015-09-29 DIAGNOSIS — F329 Major depressive disorder, single episode, unspecified: Secondary | ICD-10-CM | POA: Diagnosis not present

## 2015-09-29 DIAGNOSIS — D509 Iron deficiency anemia, unspecified: Secondary | ICD-10-CM | POA: Diagnosis not present

## 2015-09-29 DIAGNOSIS — E785 Hyperlipidemia, unspecified: Secondary | ICD-10-CM | POA: Diagnosis not present

## 2015-09-29 DIAGNOSIS — M6281 Muscle weakness (generalized): Secondary | ICD-10-CM | POA: Diagnosis not present

## 2015-09-29 DIAGNOSIS — M75102 Unspecified rotator cuff tear or rupture of left shoulder, not specified as traumatic: Secondary | ICD-10-CM | POA: Diagnosis not present

## 2015-09-29 DIAGNOSIS — R29898 Other symptoms and signs involving the musculoskeletal system: Secondary | ICD-10-CM | POA: Diagnosis not present

## 2015-10-04 DIAGNOSIS — M75102 Unspecified rotator cuff tear or rupture of left shoulder, not specified as traumatic: Secondary | ICD-10-CM | POA: Diagnosis not present

## 2015-10-04 DIAGNOSIS — E785 Hyperlipidemia, unspecified: Secondary | ICD-10-CM | POA: Diagnosis not present

## 2015-10-04 DIAGNOSIS — D509 Iron deficiency anemia, unspecified: Secondary | ICD-10-CM | POA: Diagnosis not present

## 2015-10-04 DIAGNOSIS — F329 Major depressive disorder, single episode, unspecified: Secondary | ICD-10-CM | POA: Diagnosis not present

## 2015-10-04 DIAGNOSIS — R29898 Other symptoms and signs involving the musculoskeletal system: Secondary | ICD-10-CM | POA: Diagnosis not present

## 2015-10-04 DIAGNOSIS — M6281 Muscle weakness (generalized): Secondary | ICD-10-CM | POA: Diagnosis not present

## 2015-10-06 DIAGNOSIS — Z1231 Encounter for screening mammogram for malignant neoplasm of breast: Secondary | ICD-10-CM | POA: Diagnosis not present

## 2015-10-09 DIAGNOSIS — D509 Iron deficiency anemia, unspecified: Secondary | ICD-10-CM | POA: Diagnosis not present

## 2015-10-09 DIAGNOSIS — M75102 Unspecified rotator cuff tear or rupture of left shoulder, not specified as traumatic: Secondary | ICD-10-CM | POA: Diagnosis not present

## 2015-10-09 DIAGNOSIS — E785 Hyperlipidemia, unspecified: Secondary | ICD-10-CM | POA: Diagnosis not present

## 2015-10-09 DIAGNOSIS — R29898 Other symptoms and signs involving the musculoskeletal system: Secondary | ICD-10-CM | POA: Diagnosis not present

## 2015-10-09 DIAGNOSIS — F329 Major depressive disorder, single episode, unspecified: Secondary | ICD-10-CM | POA: Diagnosis not present

## 2015-10-09 DIAGNOSIS — M6281 Muscle weakness (generalized): Secondary | ICD-10-CM | POA: Diagnosis not present

## 2015-10-11 DIAGNOSIS — E785 Hyperlipidemia, unspecified: Secondary | ICD-10-CM | POA: Diagnosis not present

## 2015-10-11 DIAGNOSIS — F329 Major depressive disorder, single episode, unspecified: Secondary | ICD-10-CM | POA: Diagnosis not present

## 2015-10-11 DIAGNOSIS — D509 Iron deficiency anemia, unspecified: Secondary | ICD-10-CM | POA: Diagnosis not present

## 2015-10-11 DIAGNOSIS — M6281 Muscle weakness (generalized): Secondary | ICD-10-CM | POA: Diagnosis not present

## 2015-10-11 DIAGNOSIS — M75102 Unspecified rotator cuff tear or rupture of left shoulder, not specified as traumatic: Secondary | ICD-10-CM | POA: Diagnosis not present

## 2015-10-11 DIAGNOSIS — R29898 Other symptoms and signs involving the musculoskeletal system: Secondary | ICD-10-CM | POA: Diagnosis not present

## 2015-10-12 DIAGNOSIS — M75102 Unspecified rotator cuff tear or rupture of left shoulder, not specified as traumatic: Secondary | ICD-10-CM | POA: Diagnosis not present

## 2015-10-12 DIAGNOSIS — F329 Major depressive disorder, single episode, unspecified: Secondary | ICD-10-CM | POA: Diagnosis not present

## 2015-10-12 DIAGNOSIS — R29898 Other symptoms and signs involving the musculoskeletal system: Secondary | ICD-10-CM | POA: Diagnosis not present

## 2015-10-12 DIAGNOSIS — M6281 Muscle weakness (generalized): Secondary | ICD-10-CM | POA: Diagnosis not present

## 2015-10-12 DIAGNOSIS — E785 Hyperlipidemia, unspecified: Secondary | ICD-10-CM | POA: Diagnosis not present

## 2015-10-12 DIAGNOSIS — D509 Iron deficiency anemia, unspecified: Secondary | ICD-10-CM | POA: Diagnosis not present

## 2015-10-16 DIAGNOSIS — H1851 Endothelial corneal dystrophy: Secondary | ICD-10-CM | POA: Diagnosis not present

## 2015-10-16 DIAGNOSIS — Z961 Presence of intraocular lens: Secondary | ICD-10-CM | POA: Diagnosis not present

## 2015-10-16 DIAGNOSIS — H26491 Other secondary cataract, right eye: Secondary | ICD-10-CM | POA: Diagnosis not present

## 2015-10-17 DIAGNOSIS — R29898 Other symptoms and signs involving the musculoskeletal system: Secondary | ICD-10-CM | POA: Diagnosis not present

## 2015-10-17 DIAGNOSIS — F329 Major depressive disorder, single episode, unspecified: Secondary | ICD-10-CM | POA: Diagnosis not present

## 2015-10-17 DIAGNOSIS — M6281 Muscle weakness (generalized): Secondary | ICD-10-CM | POA: Diagnosis not present

## 2015-10-17 DIAGNOSIS — E785 Hyperlipidemia, unspecified: Secondary | ICD-10-CM | POA: Diagnosis not present

## 2015-10-17 DIAGNOSIS — M75102 Unspecified rotator cuff tear or rupture of left shoulder, not specified as traumatic: Secondary | ICD-10-CM | POA: Diagnosis not present

## 2015-10-17 DIAGNOSIS — D509 Iron deficiency anemia, unspecified: Secondary | ICD-10-CM | POA: Diagnosis not present

## 2015-10-18 DIAGNOSIS — R29898 Other symptoms and signs involving the musculoskeletal system: Secondary | ICD-10-CM | POA: Diagnosis not present

## 2015-10-18 DIAGNOSIS — D509 Iron deficiency anemia, unspecified: Secondary | ICD-10-CM | POA: Diagnosis not present

## 2015-10-18 DIAGNOSIS — E785 Hyperlipidemia, unspecified: Secondary | ICD-10-CM | POA: Diagnosis not present

## 2015-10-18 DIAGNOSIS — M75102 Unspecified rotator cuff tear or rupture of left shoulder, not specified as traumatic: Secondary | ICD-10-CM | POA: Diagnosis not present

## 2015-10-18 DIAGNOSIS — M6281 Muscle weakness (generalized): Secondary | ICD-10-CM | POA: Diagnosis not present

## 2015-10-18 DIAGNOSIS — F329 Major depressive disorder, single episode, unspecified: Secondary | ICD-10-CM | POA: Diagnosis not present

## 2015-10-19 ENCOUNTER — Ambulatory Visit (INDEPENDENT_AMBULATORY_CARE_PROVIDER_SITE_OTHER): Payer: Medicare Other | Admitting: Podiatry

## 2015-10-19 ENCOUNTER — Encounter: Payer: Self-pay | Admitting: Podiatry

## 2015-10-19 DIAGNOSIS — M2042 Other hammer toe(s) (acquired), left foot: Secondary | ICD-10-CM

## 2015-10-19 NOTE — Progress Notes (Signed)
Subjective:     Patient ID: Kristin Solomon, female   DOB: December 03, 1929, 82 y.o.   MRN: [id number]  HPI this patient presents to the office with chief complaint of a painful fifth toe left foot. Patient gives a history of having broken her toe when her children were young and the toe has become disfigured. She says that she has started to wear new [employer] shoes which has caused a corn to develop fifth toe left foot. He presents the office today for an evaluation and treatment of this condition   Review of Systems     Objective:   Physical Exam GENERAL APPEARANCE: Alert, conversant. Appropriately groomed. No acute distress.  VASCULAR: Pedal pulses are  palpable at  [facility] and PT bilateral.  Capillary refill time is immediate to all digits,  Normal temperature gradient.  Digital hair growth is present bilateral  NEUROLOGIC: sensation is normal to 5.07 monofilament at 5/5 sites bilateral.  Light touch is intact bilateral, Muscle strength normal.  MUSCULOSKELETAL: acceptable muscle strength, tone and stability bilateral.  Intrinsic muscluature intact bilateral.  Rectus appearance of foot and digits noted bilateral. Hammer toe fifth toe left foot with minimal callus noted.  DERMATOLOGIC: skin color, texture, and turgor are within normal limits.  No preulcerative lesions or ulcers  are seen, no interdigital maceration noted.  No open lesions present.  Digital nails are asymptomatic. No drainage noted.     Assessment:     Hammer toe left foot.     Plan:     ROV  Told This patient that her new shoes where causing pressure on her fifth toe hammertoe which is contracted. Patient was given a toe/digital pad. Patient to return to the office when necessary   Gardiner Barefoot DPM

## 2015-10-20 DIAGNOSIS — M75102 Unspecified rotator cuff tear or rupture of left shoulder, not specified as traumatic: Secondary | ICD-10-CM | POA: Diagnosis not present

## 2015-10-20 DIAGNOSIS — E785 Hyperlipidemia, unspecified: Secondary | ICD-10-CM | POA: Diagnosis not present

## 2015-10-20 DIAGNOSIS — R29898 Other symptoms and signs involving the musculoskeletal system: Secondary | ICD-10-CM | POA: Diagnosis not present

## 2015-10-20 DIAGNOSIS — M6281 Muscle weakness (generalized): Secondary | ICD-10-CM | POA: Diagnosis not present

## 2015-10-20 DIAGNOSIS — D509 Iron deficiency anemia, unspecified: Secondary | ICD-10-CM | POA: Diagnosis not present

## 2015-10-20 DIAGNOSIS — F329 Major depressive disorder, single episode, unspecified: Secondary | ICD-10-CM | POA: Diagnosis not present

## 2015-10-24 DIAGNOSIS — R29898 Other symptoms and signs involving the musculoskeletal system: Secondary | ICD-10-CM | POA: Diagnosis not present

## 2015-10-24 DIAGNOSIS — F329 Major depressive disorder, single episode, unspecified: Secondary | ICD-10-CM | POA: Diagnosis not present

## 2015-10-24 DIAGNOSIS — M75102 Unspecified rotator cuff tear or rupture of left shoulder, not specified as traumatic: Secondary | ICD-10-CM | POA: Diagnosis not present

## 2015-10-24 DIAGNOSIS — E785 Hyperlipidemia, unspecified: Secondary | ICD-10-CM | POA: Diagnosis not present

## 2015-10-24 DIAGNOSIS — D509 Iron deficiency anemia, unspecified: Secondary | ICD-10-CM | POA: Diagnosis not present

## 2015-10-24 DIAGNOSIS — M6281 Muscle weakness (generalized): Secondary | ICD-10-CM | POA: Diagnosis not present

## 2015-10-25 DIAGNOSIS — D509 Iron deficiency anemia, unspecified: Secondary | ICD-10-CM | POA: Diagnosis not present

## 2015-10-25 DIAGNOSIS — M6281 Muscle weakness (generalized): Secondary | ICD-10-CM | POA: Diagnosis not present

## 2015-10-25 DIAGNOSIS — E785 Hyperlipidemia, unspecified: Secondary | ICD-10-CM | POA: Diagnosis not present

## 2015-10-25 DIAGNOSIS — F329 Major depressive disorder, single episode, unspecified: Secondary | ICD-10-CM | POA: Diagnosis not present

## 2015-10-25 DIAGNOSIS — R29898 Other symptoms and signs involving the musculoskeletal system: Secondary | ICD-10-CM | POA: Diagnosis not present

## 2015-10-25 DIAGNOSIS — M75102 Unspecified rotator cuff tear or rupture of left shoulder, not specified as traumatic: Secondary | ICD-10-CM | POA: Diagnosis not present

## 2015-10-27 DIAGNOSIS — M75102 Unspecified rotator cuff tear or rupture of left shoulder, not specified as traumatic: Secondary | ICD-10-CM | POA: Diagnosis not present

## 2015-10-27 DIAGNOSIS — F329 Major depressive disorder, single episode, unspecified: Secondary | ICD-10-CM | POA: Diagnosis not present

## 2015-10-27 DIAGNOSIS — M6281 Muscle weakness (generalized): Secondary | ICD-10-CM | POA: Diagnosis not present

## 2015-10-27 DIAGNOSIS — E785 Hyperlipidemia, unspecified: Secondary | ICD-10-CM | POA: Diagnosis not present

## 2015-10-27 DIAGNOSIS — D509 Iron deficiency anemia, unspecified: Secondary | ICD-10-CM | POA: Diagnosis not present

## 2015-10-27 DIAGNOSIS — R29898 Other symptoms and signs involving the musculoskeletal system: Secondary | ICD-10-CM | POA: Diagnosis not present

## 2015-10-30 DIAGNOSIS — F329 Major depressive disorder, single episode, unspecified: Secondary | ICD-10-CM | POA: Diagnosis not present

## 2015-10-30 DIAGNOSIS — D509 Iron deficiency anemia, unspecified: Secondary | ICD-10-CM | POA: Diagnosis not present

## 2015-10-30 DIAGNOSIS — E785 Hyperlipidemia, unspecified: Secondary | ICD-10-CM | POA: Diagnosis not present

## 2015-10-30 DIAGNOSIS — M6281 Muscle weakness (generalized): Secondary | ICD-10-CM | POA: Diagnosis not present

## 2015-10-30 DIAGNOSIS — M75102 Unspecified rotator cuff tear or rupture of left shoulder, not specified as traumatic: Secondary | ICD-10-CM | POA: Diagnosis not present

## 2015-10-30 DIAGNOSIS — R29898 Other symptoms and signs involving the musculoskeletal system: Secondary | ICD-10-CM | POA: Diagnosis not present

## 2015-10-31 DIAGNOSIS — M6281 Muscle weakness (generalized): Secondary | ICD-10-CM | POA: Diagnosis not present

## 2015-10-31 DIAGNOSIS — R29898 Other symptoms and signs involving the musculoskeletal system: Secondary | ICD-10-CM | POA: Diagnosis not present

## 2015-10-31 DIAGNOSIS — M75102 Unspecified rotator cuff tear or rupture of left shoulder, not specified as traumatic: Secondary | ICD-10-CM | POA: Diagnosis not present

## 2015-10-31 DIAGNOSIS — F329 Major depressive disorder, single episode, unspecified: Secondary | ICD-10-CM | POA: Diagnosis not present

## 2015-10-31 DIAGNOSIS — E785 Hyperlipidemia, unspecified: Secondary | ICD-10-CM | POA: Diagnosis not present

## 2015-10-31 DIAGNOSIS — D509 Iron deficiency anemia, unspecified: Secondary | ICD-10-CM | POA: Diagnosis not present

## 2015-11-06 DIAGNOSIS — E785 Hyperlipidemia, unspecified: Secondary | ICD-10-CM | POA: Diagnosis not present

## 2015-11-06 DIAGNOSIS — M6281 Muscle weakness (generalized): Secondary | ICD-10-CM | POA: Diagnosis not present

## 2015-11-06 DIAGNOSIS — M75102 Unspecified rotator cuff tear or rupture of left shoulder, not specified as traumatic: Secondary | ICD-10-CM | POA: Diagnosis not present

## 2015-11-06 DIAGNOSIS — D509 Iron deficiency anemia, unspecified: Secondary | ICD-10-CM | POA: Diagnosis not present

## 2015-11-06 DIAGNOSIS — R29898 Other symptoms and signs involving the musculoskeletal system: Secondary | ICD-10-CM | POA: Diagnosis not present

## 2015-11-06 DIAGNOSIS — F329 Major depressive disorder, single episode, unspecified: Secondary | ICD-10-CM | POA: Diagnosis not present

## 2015-11-07 DIAGNOSIS — D509 Iron deficiency anemia, unspecified: Secondary | ICD-10-CM | POA: Diagnosis not present

## 2015-11-07 DIAGNOSIS — D2312 Other benign neoplasm of skin of left eyelid, including canthus: Secondary | ICD-10-CM | POA: Diagnosis not present

## 2015-11-07 DIAGNOSIS — M6281 Muscle weakness (generalized): Secondary | ICD-10-CM | POA: Diagnosis not present

## 2015-11-07 DIAGNOSIS — H16212 Exposure keratoconjunctivitis, left eye: Secondary | ICD-10-CM | POA: Diagnosis not present

## 2015-11-07 DIAGNOSIS — Z79899 Other long term (current) drug therapy: Secondary | ICD-10-CM | POA: Diagnosis not present

## 2015-11-07 DIAGNOSIS — H02423 Myogenic ptosis of bilateral eyelids: Secondary | ICD-10-CM | POA: Diagnosis not present

## 2015-11-07 DIAGNOSIS — M75102 Unspecified rotator cuff tear or rupture of left shoulder, not specified as traumatic: Secondary | ICD-10-CM | POA: Diagnosis not present

## 2015-11-07 DIAGNOSIS — R29898 Other symptoms and signs involving the musculoskeletal system: Secondary | ICD-10-CM | POA: Diagnosis not present

## 2015-11-07 DIAGNOSIS — Z87891 Personal history of nicotine dependence: Secondary | ICD-10-CM | POA: Diagnosis not present

## 2015-11-07 DIAGNOSIS — H02204 Unspecified lagophthalmos left upper eyelid: Secondary | ICD-10-CM | POA: Diagnosis not present

## 2015-11-07 DIAGNOSIS — F329 Major depressive disorder, single episode, unspecified: Secondary | ICD-10-CM | POA: Diagnosis not present

## 2015-11-07 DIAGNOSIS — E785 Hyperlipidemia, unspecified: Secondary | ICD-10-CM | POA: Diagnosis not present

## 2015-11-28 DIAGNOSIS — K140 Glossitis: Secondary | ICD-10-CM | POA: Diagnosis not present

## 2015-11-28 DIAGNOSIS — M545 Low back pain: Secondary | ICD-10-CM | POA: Diagnosis not present

## 2015-11-29 DIAGNOSIS — H26491 Other secondary cataract, right eye: Secondary | ICD-10-CM | POA: Diagnosis not present

## 2015-11-29 DIAGNOSIS — Z961 Presence of intraocular lens: Secondary | ICD-10-CM | POA: Diagnosis not present

## 2015-11-29 DIAGNOSIS — Z947 Corneal transplant status: Secondary | ICD-10-CM | POA: Diagnosis not present

## 2015-11-29 DIAGNOSIS — H02204 Unspecified lagophthalmos left upper eyelid: Secondary | ICD-10-CM | POA: Diagnosis not present

## 2015-11-29 DIAGNOSIS — H02423 Myogenic ptosis of bilateral eyelids: Secondary | ICD-10-CM | POA: Diagnosis not present

## 2015-12-29 DIAGNOSIS — H26491 Other secondary cataract, right eye: Secondary | ICD-10-CM | POA: Diagnosis not present

## 2016-01-12 DIAGNOSIS — R040 Epistaxis: Secondary | ICD-10-CM | POA: Diagnosis not present

## 2016-01-16 DIAGNOSIS — R040 Epistaxis: Secondary | ICD-10-CM | POA: Diagnosis not present

## 2016-01-16 DIAGNOSIS — W19XXXA Unspecified fall, initial encounter: Secondary | ICD-10-CM | POA: Diagnosis not present

## 2016-01-16 DIAGNOSIS — Z862 Personal history of diseases of the blood and blood-forming organs and certain disorders involving the immune mechanism: Secondary | ICD-10-CM | POA: Diagnosis not present

## 2016-01-16 DIAGNOSIS — T148 Other injury of unspecified body region: Secondary | ICD-10-CM | POA: Diagnosis not present

## 2016-01-25 DIAGNOSIS — Z09 Encounter for follow-up examination after completed treatment for conditions other than malignant neoplasm: Secondary | ICD-10-CM | POA: Diagnosis not present

## 2016-01-25 DIAGNOSIS — M25561 Pain in right knee: Secondary | ICD-10-CM | POA: Diagnosis not present

## 2016-01-25 DIAGNOSIS — Z96653 Presence of artificial knee joint, bilateral: Secondary | ICD-10-CM | POA: Diagnosis not present

## 2016-01-25 DIAGNOSIS — Z96642 Presence of left artificial hip joint: Secondary | ICD-10-CM | POA: Diagnosis not present

## 2016-01-25 DIAGNOSIS — M25552 Pain in left hip: Secondary | ICD-10-CM | POA: Diagnosis not present

## 2016-01-25 DIAGNOSIS — M25562 Pain in left knee: Secondary | ICD-10-CM | POA: Diagnosis not present

## 2016-02-22 DIAGNOSIS — R5382 Chronic fatigue, unspecified: Secondary | ICD-10-CM | POA: Diagnosis not present

## 2016-03-26 DIAGNOSIS — M25562 Pain in left knee: Secondary | ICD-10-CM | POA: Diagnosis not present

## 2016-04-04 DIAGNOSIS — Z23 Encounter for immunization: Secondary | ICD-10-CM | POA: Diagnosis not present

## 2016-04-11 ENCOUNTER — Ambulatory Visit
Admission: RE | Admit: 2016-04-11 | Discharge: 2016-04-11 | Disposition: A | Discharge status: Home / Self Care | Payer: Medicare Other | Source: Ambulatory Visit | Attending: Internal Medicine | Admitting: Internal Medicine

## 2016-04-11 ENCOUNTER — Other Ambulatory Visit: Payer: Self-pay | Admitting: Internal Medicine

## 2016-04-11 DIAGNOSIS — D509 Iron deficiency anemia, unspecified: Secondary | ICD-10-CM | POA: Diagnosis not present

## 2016-04-11 DIAGNOSIS — R5383 Other fatigue: Secondary | ICD-10-CM | POA: Diagnosis not present

## 2016-04-11 DIAGNOSIS — R0689 Other abnormalities of breathing: Secondary | ICD-10-CM | POA: Diagnosis not present

## 2016-04-11 DIAGNOSIS — R195 Other fecal abnormalities: Secondary | ICD-10-CM | POA: Diagnosis not present

## 2016-04-11 DIAGNOSIS — R0989 Other specified symptoms and signs involving the circulatory and respiratory systems: Secondary | ICD-10-CM

## 2016-04-11 DIAGNOSIS — J9811 Atelectasis: Secondary | ICD-10-CM | POA: Diagnosis not present

## 2016-04-11 DIAGNOSIS — R05 Cough: Secondary | ICD-10-CM | POA: Diagnosis not present

## 2016-04-23 DIAGNOSIS — S22080A Wedge compression fracture of T11-T12 vertebra, initial encounter for closed fracture: Secondary | ICD-10-CM | POA: Diagnosis not present

## 2016-04-23 DIAGNOSIS — M4726 Other spondylosis with radiculopathy, lumbar region: Secondary | ICD-10-CM | POA: Diagnosis not present

## 2016-04-23 DIAGNOSIS — M4316 Spondylolisthesis, lumbar region: Secondary | ICD-10-CM | POA: Diagnosis not present

## 2016-04-23 DIAGNOSIS — S22070A Wedge compression fracture of T9-T10 vertebra, initial encounter for closed fracture: Secondary | ICD-10-CM | POA: Diagnosis not present

## 2016-04-23 DIAGNOSIS — M4854XA Collapsed vertebra, not elsewhere classified, thoracic region, initial encounter for fracture: Secondary | ICD-10-CM | POA: Diagnosis not present

## 2016-04-23 DIAGNOSIS — M5136 Other intervertebral disc degeneration, lumbar region: Secondary | ICD-10-CM | POA: Diagnosis not present

## 2016-04-25 DIAGNOSIS — R5383 Other fatigue: Secondary | ICD-10-CM | POA: Diagnosis not present

## 2016-04-25 DIAGNOSIS — R0689 Other abnormalities of breathing: Secondary | ICD-10-CM | POA: Diagnosis not present

## 2016-04-25 DIAGNOSIS — R05 Cough: Secondary | ICD-10-CM | POA: Diagnosis not present

## 2016-04-25 DIAGNOSIS — R0989 Other specified symptoms and signs involving the circulatory and respiratory systems: Secondary | ICD-10-CM | POA: Diagnosis not present

## 2016-04-25 DIAGNOSIS — R195 Other fecal abnormalities: Secondary | ICD-10-CM | POA: Diagnosis not present

## 2016-06-06 DIAGNOSIS — R195 Other fecal abnormalities: Secondary | ICD-10-CM | POA: Diagnosis not present

## 2016-06-06 DIAGNOSIS — R29898 Other symptoms and signs involving the musculoskeletal system: Secondary | ICD-10-CM | POA: Diagnosis not present

## 2016-06-06 DIAGNOSIS — Z0000 Encounter for general adult medical examination without abnormal findings: Secondary | ICD-10-CM | POA: Diagnosis not present

## 2016-06-06 DIAGNOSIS — E782 Mixed hyperlipidemia: Secondary | ICD-10-CM | POA: Diagnosis not present

## 2016-06-06 DIAGNOSIS — E559 Vitamin D deficiency, unspecified: Secondary | ICD-10-CM | POA: Diagnosis not present

## 2016-06-06 DIAGNOSIS — F329 Major depressive disorder, single episode, unspecified: Secondary | ICD-10-CM | POA: Diagnosis not present

## 2016-06-06 DIAGNOSIS — Z79899 Other long term (current) drug therapy: Secondary | ICD-10-CM | POA: Diagnosis not present

## 2016-06-06 DIAGNOSIS — R5383 Other fatigue: Secondary | ICD-10-CM | POA: Diagnosis not present

## 2016-06-06 DIAGNOSIS — Z1389 Encounter for screening for other disorder: Secondary | ICD-10-CM | POA: Diagnosis not present

## 2016-06-06 DIAGNOSIS — F322 Major depressive disorder, single episode, severe without psychotic features: Secondary | ICD-10-CM | POA: Diagnosis not present

## 2016-06-06 DIAGNOSIS — I10 Essential (primary) hypertension: Secondary | ICD-10-CM | POA: Diagnosis not present

## 2016-06-06 DIAGNOSIS — D500 Iron deficiency anemia secondary to blood loss (chronic): Secondary | ICD-10-CM | POA: Diagnosis not present

## 2016-06-06 DIAGNOSIS — R413 Other amnesia: Secondary | ICD-10-CM | POA: Diagnosis not present

## 2016-06-14 DIAGNOSIS — L820 Inflamed seborrheic keratosis: Secondary | ICD-10-CM | POA: Diagnosis not present

## 2016-07-01 DIAGNOSIS — C44729 Squamous cell carcinoma of skin of left lower limb, including hip: Secondary | ICD-10-CM | POA: Diagnosis not present

## 2016-07-15 DIAGNOSIS — Z85828 Personal history of other malignant neoplasm of skin: Secondary | ICD-10-CM | POA: Diagnosis not present

## 2016-07-15 DIAGNOSIS — C44729 Squamous cell carcinoma of skin of left lower limb, including hip: Secondary | ICD-10-CM | POA: Diagnosis not present

## 2016-07-24 DIAGNOSIS — H02403 Unspecified ptosis of bilateral eyelids: Secondary | ICD-10-CM | POA: Diagnosis not present

## 2016-07-24 DIAGNOSIS — Z961 Presence of intraocular lens: Secondary | ICD-10-CM | POA: Diagnosis not present

## 2016-07-24 DIAGNOSIS — H02203 Unspecified lagophthalmos right eye, unspecified eyelid: Secondary | ICD-10-CM | POA: Diagnosis not present

## 2016-07-24 DIAGNOSIS — H40003 Preglaucoma, unspecified, bilateral: Secondary | ICD-10-CM | POA: Diagnosis not present

## 2016-07-24 DIAGNOSIS — Z947 Corneal transplant status: Secondary | ICD-10-CM | POA: Diagnosis not present

## 2016-07-24 DIAGNOSIS — H04122 Dry eye syndrome of left lacrimal gland: Secondary | ICD-10-CM | POA: Diagnosis not present

## 2016-07-24 DIAGNOSIS — H02206 Unspecified lagophthalmos left eye, unspecified eyelid: Secondary | ICD-10-CM | POA: Diagnosis not present

## 2016-08-05 DIAGNOSIS — M6281 Muscle weakness (generalized): Secondary | ICD-10-CM | POA: Diagnosis not present

## 2016-08-09 DIAGNOSIS — F419 Anxiety disorder, unspecified: Secondary | ICD-10-CM | POA: Diagnosis not present

## 2016-08-09 DIAGNOSIS — F329 Major depressive disorder, single episode, unspecified: Secondary | ICD-10-CM | POA: Diagnosis not present

## 2016-08-09 DIAGNOSIS — R2681 Unsteadiness on feet: Secondary | ICD-10-CM | POA: Diagnosis not present

## 2016-08-09 DIAGNOSIS — E785 Hyperlipidemia, unspecified: Secondary | ICD-10-CM | POA: Diagnosis not present

## 2016-08-09 DIAGNOSIS — G548 Other nerve root and plexus disorders: Secondary | ICD-10-CM | POA: Diagnosis not present

## 2016-08-09 DIAGNOSIS — D509 Iron deficiency anemia, unspecified: Secondary | ICD-10-CM | POA: Diagnosis not present

## 2016-08-09 DIAGNOSIS — S46001A Unspecified injury of muscle(s) and tendon(s) of the rotator cuff of right shoulder, initial encounter: Secondary | ICD-10-CM | POA: Diagnosis not present

## 2016-08-09 DIAGNOSIS — M6281 Muscle weakness (generalized): Secondary | ICD-10-CM | POA: Diagnosis not present

## 2016-08-09 DIAGNOSIS — M75102 Unspecified rotator cuff tear or rupture of left shoulder, not specified as traumatic: Secondary | ICD-10-CM | POA: Diagnosis not present

## 2016-08-09 DIAGNOSIS — R29898 Other symptoms and signs involving the musculoskeletal system: Secondary | ICD-10-CM | POA: Diagnosis not present

## 2016-08-12 DIAGNOSIS — M75102 Unspecified rotator cuff tear or rupture of left shoulder, not specified as traumatic: Secondary | ICD-10-CM | POA: Diagnosis not present

## 2016-08-12 DIAGNOSIS — D509 Iron deficiency anemia, unspecified: Secondary | ICD-10-CM | POA: Diagnosis not present

## 2016-08-12 DIAGNOSIS — M6281 Muscle weakness (generalized): Secondary | ICD-10-CM | POA: Diagnosis not present

## 2016-08-12 DIAGNOSIS — S46001A Unspecified injury of muscle(s) and tendon(s) of the rotator cuff of right shoulder, initial encounter: Secondary | ICD-10-CM | POA: Diagnosis not present

## 2016-08-12 DIAGNOSIS — R2681 Unsteadiness on feet: Secondary | ICD-10-CM | POA: Diagnosis not present

## 2016-08-12 DIAGNOSIS — R29898 Other symptoms and signs involving the musculoskeletal system: Secondary | ICD-10-CM | POA: Diagnosis not present

## 2016-08-14 DIAGNOSIS — R2681 Unsteadiness on feet: Secondary | ICD-10-CM | POA: Diagnosis not present

## 2016-08-14 DIAGNOSIS — R29898 Other symptoms and signs involving the musculoskeletal system: Secondary | ICD-10-CM | POA: Diagnosis not present

## 2016-08-14 DIAGNOSIS — D509 Iron deficiency anemia, unspecified: Secondary | ICD-10-CM | POA: Diagnosis not present

## 2016-08-14 DIAGNOSIS — M6281 Muscle weakness (generalized): Secondary | ICD-10-CM | POA: Diagnosis not present

## 2016-08-14 DIAGNOSIS — S46001A Unspecified injury of muscle(s) and tendon(s) of the rotator cuff of right shoulder, initial encounter: Secondary | ICD-10-CM | POA: Diagnosis not present

## 2016-08-14 DIAGNOSIS — M75102 Unspecified rotator cuff tear or rupture of left shoulder, not specified as traumatic: Secondary | ICD-10-CM | POA: Diagnosis not present

## 2016-08-16 DIAGNOSIS — R29898 Other symptoms and signs involving the musculoskeletal system: Secondary | ICD-10-CM | POA: Diagnosis not present

## 2016-08-16 DIAGNOSIS — R2681 Unsteadiness on feet: Secondary | ICD-10-CM | POA: Diagnosis not present

## 2016-08-16 DIAGNOSIS — M75102 Unspecified rotator cuff tear or rupture of left shoulder, not specified as traumatic: Secondary | ICD-10-CM | POA: Diagnosis not present

## 2016-08-16 DIAGNOSIS — D509 Iron deficiency anemia, unspecified: Secondary | ICD-10-CM | POA: Diagnosis not present

## 2016-08-16 DIAGNOSIS — S46001A Unspecified injury of muscle(s) and tendon(s) of the rotator cuff of right shoulder, initial encounter: Secondary | ICD-10-CM | POA: Diagnosis not present

## 2016-08-16 DIAGNOSIS — M6281 Muscle weakness (generalized): Secondary | ICD-10-CM | POA: Diagnosis not present

## 2016-08-19 DIAGNOSIS — M6281 Muscle weakness (generalized): Secondary | ICD-10-CM | POA: Diagnosis not present

## 2016-08-19 DIAGNOSIS — R2681 Unsteadiness on feet: Secondary | ICD-10-CM | POA: Diagnosis not present

## 2016-08-19 DIAGNOSIS — M75102 Unspecified rotator cuff tear or rupture of left shoulder, not specified as traumatic: Secondary | ICD-10-CM | POA: Diagnosis not present

## 2016-08-19 DIAGNOSIS — D509 Iron deficiency anemia, unspecified: Secondary | ICD-10-CM | POA: Diagnosis not present

## 2016-08-19 DIAGNOSIS — R29898 Other symptoms and signs involving the musculoskeletal system: Secondary | ICD-10-CM | POA: Diagnosis not present

## 2016-08-19 DIAGNOSIS — S46001A Unspecified injury of muscle(s) and tendon(s) of the rotator cuff of right shoulder, initial encounter: Secondary | ICD-10-CM | POA: Diagnosis not present

## 2016-08-21 DIAGNOSIS — D509 Iron deficiency anemia, unspecified: Secondary | ICD-10-CM | POA: Diagnosis not present

## 2016-08-21 DIAGNOSIS — M75102 Unspecified rotator cuff tear or rupture of left shoulder, not specified as traumatic: Secondary | ICD-10-CM | POA: Diagnosis not present

## 2016-08-21 DIAGNOSIS — R29898 Other symptoms and signs involving the musculoskeletal system: Secondary | ICD-10-CM | POA: Diagnosis not present

## 2016-08-21 DIAGNOSIS — R2681 Unsteadiness on feet: Secondary | ICD-10-CM | POA: Diagnosis not present

## 2016-08-21 DIAGNOSIS — M6281 Muscle weakness (generalized): Secondary | ICD-10-CM | POA: Diagnosis not present

## 2016-08-21 DIAGNOSIS — S46001A Unspecified injury of muscle(s) and tendon(s) of the rotator cuff of right shoulder, initial encounter: Secondary | ICD-10-CM | POA: Diagnosis not present

## 2016-08-22 DIAGNOSIS — D509 Iron deficiency anemia, unspecified: Secondary | ICD-10-CM | POA: Diagnosis not present

## 2016-08-22 DIAGNOSIS — F419 Anxiety disorder, unspecified: Secondary | ICD-10-CM | POA: Diagnosis not present

## 2016-08-22 DIAGNOSIS — M75102 Unspecified rotator cuff tear or rupture of left shoulder, not specified as traumatic: Secondary | ICD-10-CM | POA: Diagnosis not present

## 2016-08-22 DIAGNOSIS — R2681 Unsteadiness on feet: Secondary | ICD-10-CM | POA: Diagnosis not present

## 2016-08-22 DIAGNOSIS — R29898 Other symptoms and signs involving the musculoskeletal system: Secondary | ICD-10-CM | POA: Diagnosis not present

## 2016-08-22 DIAGNOSIS — F329 Major depressive disorder, single episode, unspecified: Secondary | ICD-10-CM | POA: Diagnosis not present

## 2016-08-22 DIAGNOSIS — M6281 Muscle weakness (generalized): Secondary | ICD-10-CM | POA: Diagnosis not present

## 2016-08-22 DIAGNOSIS — G548 Other nerve root and plexus disorders: Secondary | ICD-10-CM | POA: Diagnosis not present

## 2016-08-22 DIAGNOSIS — S46001A Unspecified injury of muscle(s) and tendon(s) of the rotator cuff of right shoulder, initial encounter: Secondary | ICD-10-CM | POA: Diagnosis not present

## 2016-08-22 DIAGNOSIS — E785 Hyperlipidemia, unspecified: Secondary | ICD-10-CM | POA: Diagnosis not present

## 2016-08-23 DIAGNOSIS — M6281 Muscle weakness (generalized): Secondary | ICD-10-CM | POA: Diagnosis not present

## 2016-08-23 DIAGNOSIS — R29898 Other symptoms and signs involving the musculoskeletal system: Secondary | ICD-10-CM | POA: Diagnosis not present

## 2016-08-23 DIAGNOSIS — D509 Iron deficiency anemia, unspecified: Secondary | ICD-10-CM | POA: Diagnosis not present

## 2016-08-23 DIAGNOSIS — S46001A Unspecified injury of muscle(s) and tendon(s) of the rotator cuff of right shoulder, initial encounter: Secondary | ICD-10-CM | POA: Diagnosis not present

## 2016-08-23 DIAGNOSIS — R2681 Unsteadiness on feet: Secondary | ICD-10-CM | POA: Diagnosis not present

## 2016-08-23 DIAGNOSIS — M75102 Unspecified rotator cuff tear or rupture of left shoulder, not specified as traumatic: Secondary | ICD-10-CM | POA: Diagnosis not present

## 2016-08-26 DIAGNOSIS — M75102 Unspecified rotator cuff tear or rupture of left shoulder, not specified as traumatic: Secondary | ICD-10-CM | POA: Diagnosis not present

## 2016-08-26 DIAGNOSIS — R29898 Other symptoms and signs involving the musculoskeletal system: Secondary | ICD-10-CM | POA: Diagnosis not present

## 2016-08-26 DIAGNOSIS — R2681 Unsteadiness on feet: Secondary | ICD-10-CM | POA: Diagnosis not present

## 2016-08-26 DIAGNOSIS — D509 Iron deficiency anemia, unspecified: Secondary | ICD-10-CM | POA: Diagnosis not present

## 2016-08-26 DIAGNOSIS — M6281 Muscle weakness (generalized): Secondary | ICD-10-CM | POA: Diagnosis not present

## 2016-08-26 DIAGNOSIS — S46001A Unspecified injury of muscle(s) and tendon(s) of the rotator cuff of right shoulder, initial encounter: Secondary | ICD-10-CM | POA: Diagnosis not present

## 2016-08-28 DIAGNOSIS — M6281 Muscle weakness (generalized): Secondary | ICD-10-CM | POA: Diagnosis not present

## 2016-08-28 DIAGNOSIS — M75102 Unspecified rotator cuff tear or rupture of left shoulder, not specified as traumatic: Secondary | ICD-10-CM | POA: Diagnosis not present

## 2016-08-28 DIAGNOSIS — R2681 Unsteadiness on feet: Secondary | ICD-10-CM | POA: Diagnosis not present

## 2016-08-28 DIAGNOSIS — R29898 Other symptoms and signs involving the musculoskeletal system: Secondary | ICD-10-CM | POA: Diagnosis not present

## 2016-08-28 DIAGNOSIS — S46001A Unspecified injury of muscle(s) and tendon(s) of the rotator cuff of right shoulder, initial encounter: Secondary | ICD-10-CM | POA: Diagnosis not present

## 2016-08-28 DIAGNOSIS — D509 Iron deficiency anemia, unspecified: Secondary | ICD-10-CM | POA: Diagnosis not present

## 2016-08-29 DIAGNOSIS — R2681 Unsteadiness on feet: Secondary | ICD-10-CM | POA: Diagnosis not present

## 2016-08-29 DIAGNOSIS — R29898 Other symptoms and signs involving the musculoskeletal system: Secondary | ICD-10-CM | POA: Diagnosis not present

## 2016-08-29 DIAGNOSIS — S46001A Unspecified injury of muscle(s) and tendon(s) of the rotator cuff of right shoulder, initial encounter: Secondary | ICD-10-CM | POA: Diagnosis not present

## 2016-08-29 DIAGNOSIS — M6281 Muscle weakness (generalized): Secondary | ICD-10-CM | POA: Diagnosis not present

## 2016-08-29 DIAGNOSIS — M75102 Unspecified rotator cuff tear or rupture of left shoulder, not specified as traumatic: Secondary | ICD-10-CM | POA: Diagnosis not present

## 2016-08-29 DIAGNOSIS — D509 Iron deficiency anemia, unspecified: Secondary | ICD-10-CM | POA: Diagnosis not present

## 2016-08-30 DIAGNOSIS — M75102 Unspecified rotator cuff tear or rupture of left shoulder, not specified as traumatic: Secondary | ICD-10-CM | POA: Diagnosis not present

## 2016-08-30 DIAGNOSIS — R2681 Unsteadiness on feet: Secondary | ICD-10-CM | POA: Diagnosis not present

## 2016-08-30 DIAGNOSIS — S46001A Unspecified injury of muscle(s) and tendon(s) of the rotator cuff of right shoulder, initial encounter: Secondary | ICD-10-CM | POA: Diagnosis not present

## 2016-08-30 DIAGNOSIS — D509 Iron deficiency anemia, unspecified: Secondary | ICD-10-CM | POA: Diagnosis not present

## 2016-08-30 DIAGNOSIS — R29898 Other symptoms and signs involving the musculoskeletal system: Secondary | ICD-10-CM | POA: Diagnosis not present

## 2016-08-30 DIAGNOSIS — M6281 Muscle weakness (generalized): Secondary | ICD-10-CM | POA: Diagnosis not present

## 2016-09-02 DIAGNOSIS — M6281 Muscle weakness (generalized): Secondary | ICD-10-CM | POA: Diagnosis not present

## 2016-09-02 DIAGNOSIS — D509 Iron deficiency anemia, unspecified: Secondary | ICD-10-CM | POA: Diagnosis not present

## 2016-09-02 DIAGNOSIS — M75102 Unspecified rotator cuff tear or rupture of left shoulder, not specified as traumatic: Secondary | ICD-10-CM | POA: Diagnosis not present

## 2016-09-02 DIAGNOSIS — S46001A Unspecified injury of muscle(s) and tendon(s) of the rotator cuff of right shoulder, initial encounter: Secondary | ICD-10-CM | POA: Diagnosis not present

## 2016-09-02 DIAGNOSIS — R29898 Other symptoms and signs involving the musculoskeletal system: Secondary | ICD-10-CM | POA: Diagnosis not present

## 2016-09-02 DIAGNOSIS — R2681 Unsteadiness on feet: Secondary | ICD-10-CM | POA: Diagnosis not present

## 2016-09-04 DIAGNOSIS — D509 Iron deficiency anemia, unspecified: Secondary | ICD-10-CM | POA: Diagnosis not present

## 2016-09-04 DIAGNOSIS — M6281 Muscle weakness (generalized): Secondary | ICD-10-CM | POA: Diagnosis not present

## 2016-09-04 DIAGNOSIS — R29898 Other symptoms and signs involving the musculoskeletal system: Secondary | ICD-10-CM | POA: Diagnosis not present

## 2016-09-04 DIAGNOSIS — R2681 Unsteadiness on feet: Secondary | ICD-10-CM | POA: Diagnosis not present

## 2016-09-04 DIAGNOSIS — S46001A Unspecified injury of muscle(s) and tendon(s) of the rotator cuff of right shoulder, initial encounter: Secondary | ICD-10-CM | POA: Diagnosis not present

## 2016-09-04 DIAGNOSIS — M75102 Unspecified rotator cuff tear or rupture of left shoulder, not specified as traumatic: Secondary | ICD-10-CM | POA: Diagnosis not present

## 2016-09-06 DIAGNOSIS — S46001A Unspecified injury of muscle(s) and tendon(s) of the rotator cuff of right shoulder, initial encounter: Secondary | ICD-10-CM | POA: Diagnosis not present

## 2016-09-06 DIAGNOSIS — R2681 Unsteadiness on feet: Secondary | ICD-10-CM | POA: Diagnosis not present

## 2016-09-06 DIAGNOSIS — M6281 Muscle weakness (generalized): Secondary | ICD-10-CM | POA: Diagnosis not present

## 2016-09-06 DIAGNOSIS — M75102 Unspecified rotator cuff tear or rupture of left shoulder, not specified as traumatic: Secondary | ICD-10-CM | POA: Diagnosis not present

## 2016-09-06 DIAGNOSIS — D509 Iron deficiency anemia, unspecified: Secondary | ICD-10-CM | POA: Diagnosis not present

## 2016-09-06 DIAGNOSIS — R29898 Other symptoms and signs involving the musculoskeletal system: Secondary | ICD-10-CM | POA: Diagnosis not present

## 2016-09-09 DIAGNOSIS — B0232 Zoster iridocyclitis: Secondary | ICD-10-CM | POA: Diagnosis not present

## 2016-09-09 DIAGNOSIS — I10 Essential (primary) hypertension: Secondary | ICD-10-CM | POA: Diagnosis not present

## 2016-09-09 DIAGNOSIS — R413 Other amnesia: Secondary | ICD-10-CM | POA: Diagnosis not present

## 2016-09-09 DIAGNOSIS — D500 Iron deficiency anemia secondary to blood loss (chronic): Secondary | ICD-10-CM | POA: Diagnosis not present

## 2016-09-09 DIAGNOSIS — R748 Abnormal levels of other serum enzymes: Secondary | ICD-10-CM | POA: Diagnosis not present

## 2016-09-09 DIAGNOSIS — J45991 Cough variant asthma: Secondary | ICD-10-CM | POA: Diagnosis not present

## 2016-09-09 DIAGNOSIS — J301 Allergic rhinitis due to pollen: Secondary | ICD-10-CM | POA: Diagnosis not present

## 2016-09-10 DIAGNOSIS — M75102 Unspecified rotator cuff tear or rupture of left shoulder, not specified as traumatic: Secondary | ICD-10-CM | POA: Diagnosis not present

## 2016-09-10 DIAGNOSIS — D509 Iron deficiency anemia, unspecified: Secondary | ICD-10-CM | POA: Diagnosis not present

## 2016-09-10 DIAGNOSIS — R2681 Unsteadiness on feet: Secondary | ICD-10-CM | POA: Diagnosis not present

## 2016-09-10 DIAGNOSIS — R29898 Other symptoms and signs involving the musculoskeletal system: Secondary | ICD-10-CM | POA: Diagnosis not present

## 2016-09-10 DIAGNOSIS — S46001A Unspecified injury of muscle(s) and tendon(s) of the rotator cuff of right shoulder, initial encounter: Secondary | ICD-10-CM | POA: Diagnosis not present

## 2016-09-10 DIAGNOSIS — M6281 Muscle weakness (generalized): Secondary | ICD-10-CM | POA: Diagnosis not present

## 2016-09-11 DIAGNOSIS — R2681 Unsteadiness on feet: Secondary | ICD-10-CM | POA: Diagnosis not present

## 2016-09-11 DIAGNOSIS — S46001A Unspecified injury of muscle(s) and tendon(s) of the rotator cuff of right shoulder, initial encounter: Secondary | ICD-10-CM | POA: Diagnosis not present

## 2016-09-11 DIAGNOSIS — M75102 Unspecified rotator cuff tear or rupture of left shoulder, not specified as traumatic: Secondary | ICD-10-CM | POA: Diagnosis not present

## 2016-09-11 DIAGNOSIS — D509 Iron deficiency anemia, unspecified: Secondary | ICD-10-CM | POA: Diagnosis not present

## 2016-09-11 DIAGNOSIS — R29898 Other symptoms and signs involving the musculoskeletal system: Secondary | ICD-10-CM | POA: Diagnosis not present

## 2016-09-11 DIAGNOSIS — M6281 Muscle weakness (generalized): Secondary | ICD-10-CM | POA: Diagnosis not present

## 2016-09-12 DIAGNOSIS — R29898 Other symptoms and signs involving the musculoskeletal system: Secondary | ICD-10-CM | POA: Diagnosis not present

## 2016-09-12 DIAGNOSIS — M6281 Muscle weakness (generalized): Secondary | ICD-10-CM | POA: Diagnosis not present

## 2016-09-12 DIAGNOSIS — M75102 Unspecified rotator cuff tear or rupture of left shoulder, not specified as traumatic: Secondary | ICD-10-CM | POA: Diagnosis not present

## 2016-09-12 DIAGNOSIS — D509 Iron deficiency anemia, unspecified: Secondary | ICD-10-CM | POA: Diagnosis not present

## 2016-09-12 DIAGNOSIS — S46001A Unspecified injury of muscle(s) and tendon(s) of the rotator cuff of right shoulder, initial encounter: Secondary | ICD-10-CM | POA: Diagnosis not present

## 2016-09-12 DIAGNOSIS — R2681 Unsteadiness on feet: Secondary | ICD-10-CM | POA: Diagnosis not present

## 2016-09-13 DIAGNOSIS — M6281 Muscle weakness (generalized): Secondary | ICD-10-CM | POA: Diagnosis not present

## 2016-09-13 DIAGNOSIS — S46001A Unspecified injury of muscle(s) and tendon(s) of the rotator cuff of right shoulder, initial encounter: Secondary | ICD-10-CM | POA: Diagnosis not present

## 2016-09-13 DIAGNOSIS — R29898 Other symptoms and signs involving the musculoskeletal system: Secondary | ICD-10-CM | POA: Diagnosis not present

## 2016-09-13 DIAGNOSIS — D509 Iron deficiency anemia, unspecified: Secondary | ICD-10-CM | POA: Diagnosis not present

## 2016-09-13 DIAGNOSIS — M75102 Unspecified rotator cuff tear or rupture of left shoulder, not specified as traumatic: Secondary | ICD-10-CM | POA: Diagnosis not present

## 2016-09-13 DIAGNOSIS — R2681 Unsteadiness on feet: Secondary | ICD-10-CM | POA: Diagnosis not present

## 2016-09-16 DIAGNOSIS — S46001A Unspecified injury of muscle(s) and tendon(s) of the rotator cuff of right shoulder, initial encounter: Secondary | ICD-10-CM | POA: Diagnosis not present

## 2016-09-16 DIAGNOSIS — R2681 Unsteadiness on feet: Secondary | ICD-10-CM | POA: Diagnosis not present

## 2016-09-16 DIAGNOSIS — M6281 Muscle weakness (generalized): Secondary | ICD-10-CM | POA: Diagnosis not present

## 2016-09-16 DIAGNOSIS — D509 Iron deficiency anemia, unspecified: Secondary | ICD-10-CM | POA: Diagnosis not present

## 2016-09-16 DIAGNOSIS — R29898 Other symptoms and signs involving the musculoskeletal system: Secondary | ICD-10-CM | POA: Diagnosis not present

## 2016-09-16 DIAGNOSIS — M75102 Unspecified rotator cuff tear or rupture of left shoulder, not specified as traumatic: Secondary | ICD-10-CM | POA: Diagnosis not present

## 2016-09-19 DIAGNOSIS — D509 Iron deficiency anemia, unspecified: Secondary | ICD-10-CM | POA: Diagnosis not present

## 2016-09-19 DIAGNOSIS — R29898 Other symptoms and signs involving the musculoskeletal system: Secondary | ICD-10-CM | POA: Diagnosis not present

## 2016-09-19 DIAGNOSIS — M75102 Unspecified rotator cuff tear or rupture of left shoulder, not specified as traumatic: Secondary | ICD-10-CM | POA: Diagnosis not present

## 2016-09-19 DIAGNOSIS — R2681 Unsteadiness on feet: Secondary | ICD-10-CM | POA: Diagnosis not present

## 2016-09-19 DIAGNOSIS — M6281 Muscle weakness (generalized): Secondary | ICD-10-CM | POA: Diagnosis not present

## 2016-09-19 DIAGNOSIS — S46001A Unspecified injury of muscle(s) and tendon(s) of the rotator cuff of right shoulder, initial encounter: Secondary | ICD-10-CM | POA: Diagnosis not present

## 2016-09-20 DIAGNOSIS — D509 Iron deficiency anemia, unspecified: Secondary | ICD-10-CM | POA: Diagnosis not present

## 2016-09-20 DIAGNOSIS — R2681 Unsteadiness on feet: Secondary | ICD-10-CM | POA: Diagnosis not present

## 2016-09-20 DIAGNOSIS — M6281 Muscle weakness (generalized): Secondary | ICD-10-CM | POA: Diagnosis not present

## 2016-09-20 DIAGNOSIS — M75102 Unspecified rotator cuff tear or rupture of left shoulder, not specified as traumatic: Secondary | ICD-10-CM | POA: Diagnosis not present

## 2016-09-20 DIAGNOSIS — S46001A Unspecified injury of muscle(s) and tendon(s) of the rotator cuff of right shoulder, initial encounter: Secondary | ICD-10-CM | POA: Diagnosis not present

## 2016-09-20 DIAGNOSIS — R29898 Other symptoms and signs involving the musculoskeletal system: Secondary | ICD-10-CM | POA: Diagnosis not present

## 2016-09-24 DIAGNOSIS — R2681 Unsteadiness on feet: Secondary | ICD-10-CM | POA: Diagnosis not present

## 2016-09-24 DIAGNOSIS — S46001A Unspecified injury of muscle(s) and tendon(s) of the rotator cuff of right shoulder, initial encounter: Secondary | ICD-10-CM | POA: Diagnosis not present

## 2016-09-24 DIAGNOSIS — M6281 Muscle weakness (generalized): Secondary | ICD-10-CM | POA: Diagnosis not present

## 2016-09-24 DIAGNOSIS — G548 Other nerve root and plexus disorders: Secondary | ICD-10-CM | POA: Diagnosis not present

## 2016-09-24 DIAGNOSIS — E785 Hyperlipidemia, unspecified: Secondary | ICD-10-CM | POA: Diagnosis not present

## 2016-09-24 DIAGNOSIS — D509 Iron deficiency anemia, unspecified: Secondary | ICD-10-CM | POA: Diagnosis not present

## 2016-09-24 DIAGNOSIS — R29898 Other symptoms and signs involving the musculoskeletal system: Secondary | ICD-10-CM | POA: Diagnosis not present

## 2016-09-24 DIAGNOSIS — R41841 Cognitive communication deficit: Secondary | ICD-10-CM | POA: Diagnosis not present

## 2016-09-24 DIAGNOSIS — F419 Anxiety disorder, unspecified: Secondary | ICD-10-CM | POA: Diagnosis not present

## 2016-09-26 DIAGNOSIS — S46001A Unspecified injury of muscle(s) and tendon(s) of the rotator cuff of right shoulder, initial encounter: Secondary | ICD-10-CM | POA: Diagnosis not present

## 2016-09-26 DIAGNOSIS — R2681 Unsteadiness on feet: Secondary | ICD-10-CM | POA: Diagnosis not present

## 2016-09-26 DIAGNOSIS — R41841 Cognitive communication deficit: Secondary | ICD-10-CM | POA: Diagnosis not present

## 2016-09-26 DIAGNOSIS — R29898 Other symptoms and signs involving the musculoskeletal system: Secondary | ICD-10-CM | POA: Diagnosis not present

## 2016-09-26 DIAGNOSIS — D509 Iron deficiency anemia, unspecified: Secondary | ICD-10-CM | POA: Diagnosis not present

## 2016-09-26 DIAGNOSIS — M6281 Muscle weakness (generalized): Secondary | ICD-10-CM | POA: Diagnosis not present

## 2016-09-27 DIAGNOSIS — D509 Iron deficiency anemia, unspecified: Secondary | ICD-10-CM | POA: Diagnosis not present

## 2016-09-27 DIAGNOSIS — R2681 Unsteadiness on feet: Secondary | ICD-10-CM | POA: Diagnosis not present

## 2016-09-27 DIAGNOSIS — R29898 Other symptoms and signs involving the musculoskeletal system: Secondary | ICD-10-CM | POA: Diagnosis not present

## 2016-09-27 DIAGNOSIS — R41841 Cognitive communication deficit: Secondary | ICD-10-CM | POA: Diagnosis not present

## 2016-09-27 DIAGNOSIS — S46001A Unspecified injury of muscle(s) and tendon(s) of the rotator cuff of right shoulder, initial encounter: Secondary | ICD-10-CM | POA: Diagnosis not present

## 2016-09-27 DIAGNOSIS — M6281 Muscle weakness (generalized): Secondary | ICD-10-CM | POA: Diagnosis not present

## 2016-10-02 DIAGNOSIS — H04123 Dry eye syndrome of bilateral lacrimal glands: Secondary | ICD-10-CM | POA: Diagnosis not present

## 2016-10-02 DIAGNOSIS — Z961 Presence of intraocular lens: Secondary | ICD-10-CM | POA: Diagnosis not present

## 2016-10-02 DIAGNOSIS — H02203 Unspecified lagophthalmos right eye, unspecified eyelid: Secondary | ICD-10-CM | POA: Diagnosis not present

## 2016-10-02 DIAGNOSIS — H02206 Unspecified lagophthalmos left eye, unspecified eyelid: Secondary | ICD-10-CM | POA: Diagnosis not present

## 2016-10-02 DIAGNOSIS — Z947 Corneal transplant status: Secondary | ICD-10-CM | POA: Diagnosis not present

## 2016-10-02 DIAGNOSIS — H02403 Unspecified ptosis of bilateral eyelids: Secondary | ICD-10-CM | POA: Diagnosis not present

## 2016-10-09 DIAGNOSIS — D500 Iron deficiency anemia secondary to blood loss (chronic): Secondary | ICD-10-CM | POA: Diagnosis not present

## 2016-10-09 DIAGNOSIS — R413 Other amnesia: Secondary | ICD-10-CM | POA: Diagnosis not present

## 2016-10-09 DIAGNOSIS — R748 Abnormal levels of other serum enzymes: Secondary | ICD-10-CM | POA: Diagnosis not present

## 2016-10-09 DIAGNOSIS — J45991 Cough variant asthma: Secondary | ICD-10-CM | POA: Diagnosis not present

## 2016-10-09 DIAGNOSIS — B0232 Zoster iridocyclitis: Secondary | ICD-10-CM | POA: Diagnosis not present

## 2016-10-09 DIAGNOSIS — I10 Essential (primary) hypertension: Secondary | ICD-10-CM | POA: Diagnosis not present

## 2016-10-09 DIAGNOSIS — J301 Allergic rhinitis due to pollen: Secondary | ICD-10-CM | POA: Diagnosis not present

## 2016-10-09 DIAGNOSIS — B001 Herpesviral vesicular dermatitis: Secondary | ICD-10-CM | POA: Diagnosis not present

## 2016-10-18 DIAGNOSIS — Z1231 Encounter for screening mammogram for malignant neoplasm of breast: Secondary | ICD-10-CM | POA: Diagnosis not present

## 2016-10-22 DIAGNOSIS — M25562 Pain in left knee: Secondary | ICD-10-CM | POA: Diagnosis not present

## 2017-01-01 DIAGNOSIS — H02203 Unspecified lagophthalmos right eye, unspecified eyelid: Secondary | ICD-10-CM | POA: Diagnosis not present

## 2017-01-01 DIAGNOSIS — Z947 Corneal transplant status: Secondary | ICD-10-CM | POA: Diagnosis not present

## 2017-01-01 DIAGNOSIS — H04123 Dry eye syndrome of bilateral lacrimal glands: Secondary | ICD-10-CM | POA: Diagnosis not present

## 2017-01-01 DIAGNOSIS — H02204 Unspecified lagophthalmos left upper eyelid: Secondary | ICD-10-CM | POA: Diagnosis not present

## 2017-01-01 DIAGNOSIS — H02403 Unspecified ptosis of bilateral eyelids: Secondary | ICD-10-CM | POA: Diagnosis not present

## 2017-01-01 DIAGNOSIS — Z961 Presence of intraocular lens: Secondary | ICD-10-CM | POA: Diagnosis not present

## 2017-01-01 DIAGNOSIS — H02206 Unspecified lagophthalmos left eye, unspecified eyelid: Secondary | ICD-10-CM | POA: Diagnosis not present

## 2017-01-09 DIAGNOSIS — L989 Disorder of the skin and subcutaneous tissue, unspecified: Secondary | ICD-10-CM | POA: Diagnosis not present

## 2017-01-13 DIAGNOSIS — J45991 Cough variant asthma: Secondary | ICD-10-CM | POA: Diagnosis not present

## 2017-01-13 DIAGNOSIS — B0232 Zoster iridocyclitis: Secondary | ICD-10-CM | POA: Diagnosis not present

## 2017-01-13 DIAGNOSIS — J301 Allergic rhinitis due to pollen: Secondary | ICD-10-CM | POA: Diagnosis not present

## 2017-01-13 DIAGNOSIS — R748 Abnormal levels of other serum enzymes: Secondary | ICD-10-CM | POA: Diagnosis not present

## 2017-01-13 DIAGNOSIS — I10 Essential (primary) hypertension: Secondary | ICD-10-CM | POA: Diagnosis not present

## 2017-01-13 DIAGNOSIS — F0390 Unspecified dementia without behavioral disturbance: Secondary | ICD-10-CM | POA: Diagnosis not present

## 2017-01-13 DIAGNOSIS — D500 Iron deficiency anemia secondary to blood loss (chronic): Secondary | ICD-10-CM | POA: Diagnosis not present

## 2017-02-06 DIAGNOSIS — M25552 Pain in left hip: Secondary | ICD-10-CM | POA: Diagnosis not present

## 2017-02-19 DIAGNOSIS — F322 Major depressive disorder, single episode, severe without psychotic features: Secondary | ICD-10-CM | POA: Diagnosis not present

## 2017-02-19 DIAGNOSIS — J301 Allergic rhinitis due to pollen: Secondary | ICD-10-CM | POA: Diagnosis not present

## 2017-02-19 DIAGNOSIS — E559 Vitamin D deficiency, unspecified: Secondary | ICD-10-CM | POA: Diagnosis not present

## 2017-02-19 DIAGNOSIS — F0390 Unspecified dementia without behavioral disturbance: Secondary | ICD-10-CM | POA: Diagnosis not present

## 2017-02-19 DIAGNOSIS — B0232 Zoster iridocyclitis: Secondary | ICD-10-CM | POA: Diagnosis not present

## 2017-02-19 DIAGNOSIS — R079 Chest pain, unspecified: Secondary | ICD-10-CM | POA: Diagnosis not present

## 2017-02-19 DIAGNOSIS — D500 Iron deficiency anemia secondary to blood loss (chronic): Secondary | ICD-10-CM | POA: Diagnosis not present

## 2017-02-19 DIAGNOSIS — J45991 Cough variant asthma: Secondary | ICD-10-CM | POA: Diagnosis not present

## 2017-02-19 DIAGNOSIS — I10 Essential (primary) hypertension: Secondary | ICD-10-CM | POA: Diagnosis not present

## 2017-02-19 DIAGNOSIS — K5900 Constipation, unspecified: Secondary | ICD-10-CM | POA: Diagnosis not present

## 2017-02-26 DIAGNOSIS — F322 Major depressive disorder, single episode, severe without psychotic features: Secondary | ICD-10-CM | POA: Diagnosis not present

## 2017-02-26 DIAGNOSIS — I10 Essential (primary) hypertension: Secondary | ICD-10-CM | POA: Diagnosis not present

## 2017-02-26 DIAGNOSIS — F0390 Unspecified dementia without behavioral disturbance: Secondary | ICD-10-CM | POA: Diagnosis not present

## 2017-02-28 DIAGNOSIS — R410 Disorientation, unspecified: Secondary | ICD-10-CM | POA: Diagnosis not present

## 2017-03-11 DIAGNOSIS — D500 Iron deficiency anemia secondary to blood loss (chronic): Secondary | ICD-10-CM | POA: Diagnosis not present

## 2017-03-13 DIAGNOSIS — M5136 Other intervertebral disc degeneration, lumbar region: Secondary | ICD-10-CM | POA: Diagnosis not present

## 2017-03-18 DIAGNOSIS — M47816 Spondylosis without myelopathy or radiculopathy, lumbar region: Secondary | ICD-10-CM | POA: Diagnosis not present

## 2017-03-18 DIAGNOSIS — M4316 Spondylolisthesis, lumbar region: Secondary | ICD-10-CM | POA: Diagnosis not present

## 2017-03-18 DIAGNOSIS — M545 Low back pain: Secondary | ICD-10-CM | POA: Diagnosis not present

## 2017-03-18 DIAGNOSIS — M5136 Other intervertebral disc degeneration, lumbar region: Secondary | ICD-10-CM | POA: Diagnosis not present

## 2017-03-18 DIAGNOSIS — M8088XD Other osteoporosis with current pathological fracture, vertebra(e), subsequent encounter for fracture with routine healing: Secondary | ICD-10-CM | POA: Diagnosis not present

## 2017-03-18 DIAGNOSIS — M4014 Other secondary kyphosis, thoracic region: Secondary | ICD-10-CM | POA: Diagnosis not present

## 2017-03-27 DIAGNOSIS — R197 Diarrhea, unspecified: Secondary | ICD-10-CM | POA: Diagnosis not present

## 2017-03-27 DIAGNOSIS — F0390 Unspecified dementia without behavioral disturbance: Secondary | ICD-10-CM | POA: Diagnosis not present

## 2017-04-08 DIAGNOSIS — R5381 Other malaise: Secondary | ICD-10-CM | POA: Diagnosis not present

## 2017-04-08 DIAGNOSIS — M47816 Spondylosis without myelopathy or radiculopathy, lumbar region: Secondary | ICD-10-CM | POA: Diagnosis not present

## 2017-04-08 DIAGNOSIS — I10 Essential (primary) hypertension: Secondary | ICD-10-CM | POA: Diagnosis not present

## 2017-04-08 DIAGNOSIS — Z79899 Other long term (current) drug therapy: Secondary | ICD-10-CM | POA: Diagnosis not present

## 2017-04-09 DIAGNOSIS — M4316 Spondylolisthesis, lumbar region: Secondary | ICD-10-CM | POA: Diagnosis not present

## 2017-04-09 DIAGNOSIS — M47816 Spondylosis without myelopathy or radiculopathy, lumbar region: Secondary | ICD-10-CM | POA: Diagnosis not present

## 2017-04-16 DIAGNOSIS — B029 Zoster without complications: Secondary | ICD-10-CM | POA: Diagnosis not present

## 2017-05-05 DIAGNOSIS — M545 Low back pain: Secondary | ICD-10-CM | POA: Diagnosis not present

## 2017-05-05 DIAGNOSIS — M8088XD Other osteoporosis with current pathological fracture, vertebra(e), subsequent encounter for fracture with routine healing: Secondary | ICD-10-CM | POA: Diagnosis not present

## 2017-05-05 DIAGNOSIS — R030 Elevated blood-pressure reading, without diagnosis of hypertension: Secondary | ICD-10-CM | POA: Diagnosis not present

## 2017-05-07 DIAGNOSIS — H40003 Preglaucoma, unspecified, bilateral: Secondary | ICD-10-CM | POA: Diagnosis not present

## 2017-05-07 DIAGNOSIS — H04123 Dry eye syndrome of bilateral lacrimal glands: Secondary | ICD-10-CM | POA: Diagnosis not present

## 2017-05-07 DIAGNOSIS — H02403 Unspecified ptosis of bilateral eyelids: Secondary | ICD-10-CM | POA: Diagnosis not present

## 2017-05-07 DIAGNOSIS — Z947 Corneal transplant status: Secondary | ICD-10-CM | POA: Diagnosis not present

## 2017-05-07 DIAGNOSIS — H3530 Unspecified macular degeneration: Secondary | ICD-10-CM | POA: Diagnosis not present

## 2017-05-07 DIAGNOSIS — Z961 Presence of intraocular lens: Secondary | ICD-10-CM | POA: Diagnosis not present

## 2017-05-07 DIAGNOSIS — H0221C Cicatricial lagophthalmos, bilateral, upper and lower eyelids: Secondary | ICD-10-CM | POA: Diagnosis not present

## 2017-05-27 IMAGING — RF DG C-ARM 61-120 MIN
1 series · 1 of 1 positions shown · non-contrast
Comparison: 04/05/2015 thoracic spine CT from [REDACTED].

CLINICAL DATA: Back pain.  T11 kyphoplasty.

EXAM:
DG C-ARM 61-120 MIN; THORACOLUMBAR SPINE - 2 VIEW

[Series 1: run · 1 of 1 slices shown]
[im 1/1]
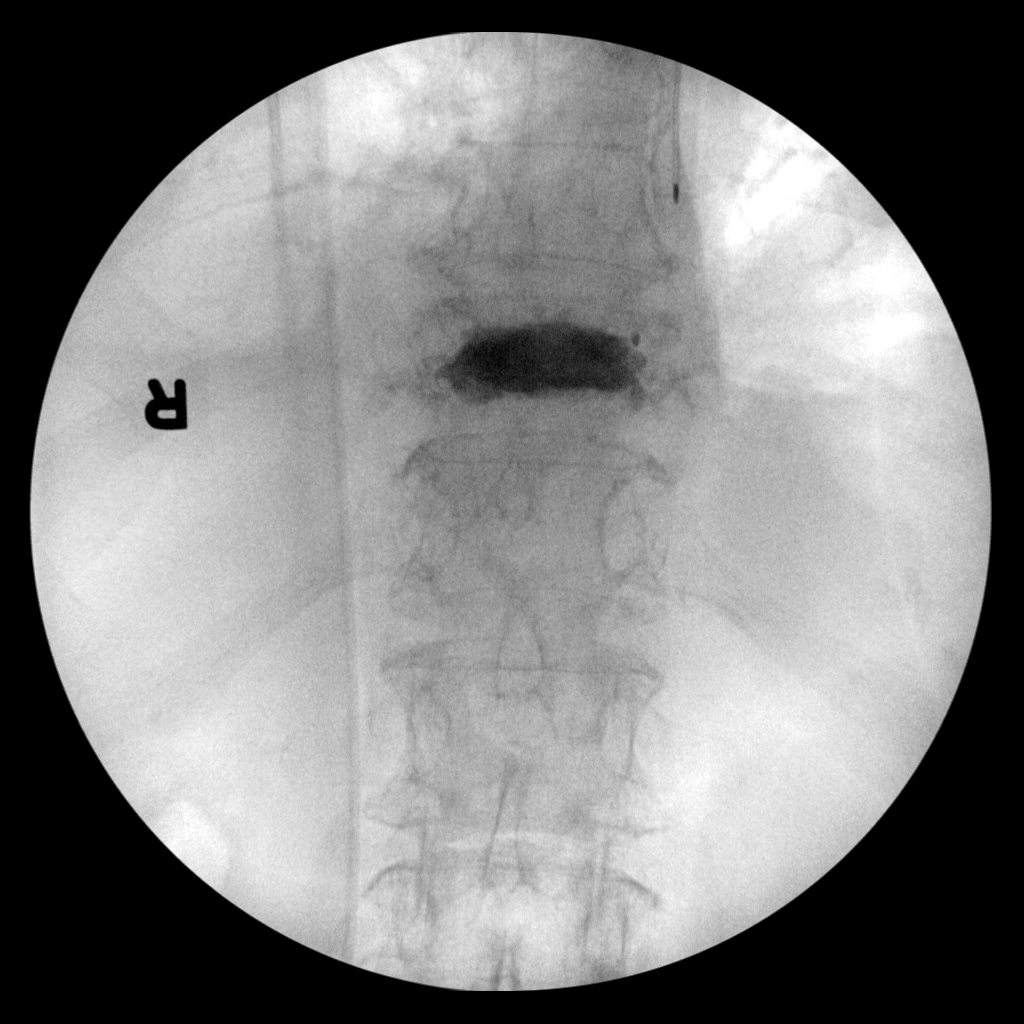

[1 of 1 positions shown; findings below may reference images not displayed]

In office lumbar spine radiographs 03/31/2015.

FLUOROSCOPY TIME:  Fluoroscopy Time:  1 minutes 27 seconds

Number of Acquired Images:  0
FINDINGS: Single frontal and lateral intraoperative spot fluoroscopic images
of the thoracolumbar junction demonstrate interval augmentation of
the known T11 compression fracture.
IMPRESSION: Intraoperative images following T11 vertebral augmentation.

## 2017-06-16 DIAGNOSIS — F322 Major depressive disorder, single episode, severe without psychotic features: Secondary | ICD-10-CM | POA: Diagnosis not present

## 2017-06-16 DIAGNOSIS — F0390 Unspecified dementia without behavioral disturbance: Secondary | ICD-10-CM | POA: Diagnosis not present

## 2017-06-16 DIAGNOSIS — I10 Essential (primary) hypertension: Secondary | ICD-10-CM | POA: Diagnosis not present

## 2017-06-16 DIAGNOSIS — B0232 Zoster iridocyclitis: Secondary | ICD-10-CM | POA: Diagnosis not present

## 2017-06-16 DIAGNOSIS — D500 Iron deficiency anemia secondary to blood loss (chronic): Secondary | ICD-10-CM | POA: Diagnosis not present

## 2017-06-16 DIAGNOSIS — E559 Vitamin D deficiency, unspecified: Secondary | ICD-10-CM | POA: Diagnosis not present

## 2017-06-16 DIAGNOSIS — K5900 Constipation, unspecified: Secondary | ICD-10-CM | POA: Diagnosis not present

## 2017-06-16 DIAGNOSIS — J301 Allergic rhinitis due to pollen: Secondary | ICD-10-CM | POA: Diagnosis not present

## 2017-07-05 DIAGNOSIS — M533 Sacrococcygeal disorders, not elsewhere classified: Secondary | ICD-10-CM | POA: Diagnosis not present

## 2017-07-05 DIAGNOSIS — M545 Low back pain: Secondary | ICD-10-CM | POA: Diagnosis not present

## 2017-07-23 DIAGNOSIS — K5900 Constipation, unspecified: Secondary | ICD-10-CM | POA: Diagnosis not present

## 2017-07-23 DIAGNOSIS — Z96659 Presence of unspecified artificial knee joint: Secondary | ICD-10-CM | POA: Diagnosis not present

## 2017-07-23 DIAGNOSIS — M6281 Muscle weakness (generalized): Secondary | ICD-10-CM | POA: Diagnosis not present

## 2017-07-23 DIAGNOSIS — R000 Tachycardia, unspecified: Secondary | ICD-10-CM | POA: Diagnosis not present

## 2017-07-23 DIAGNOSIS — R296 Repeated falls: Secondary | ICD-10-CM | POA: Diagnosis not present

## 2017-07-23 DIAGNOSIS — Z96642 Presence of left artificial hip joint: Secondary | ICD-10-CM | POA: Diagnosis not present

## 2017-07-23 DIAGNOSIS — J45901 Unspecified asthma with (acute) exacerbation: Secondary | ICD-10-CM | POA: Diagnosis not present

## 2017-07-23 DIAGNOSIS — M75102 Unspecified rotator cuff tear or rupture of left shoulder, not specified as traumatic: Secondary | ICD-10-CM | POA: Diagnosis not present

## 2017-07-23 DIAGNOSIS — Z947 Corneal transplant status: Secondary | ICD-10-CM | POA: Diagnosis not present

## 2017-07-23 DIAGNOSIS — K219 Gastro-esophageal reflux disease without esophagitis: Secondary | ICD-10-CM | POA: Diagnosis not present

## 2017-07-23 DIAGNOSIS — J42 Unspecified chronic bronchitis: Secondary | ICD-10-CM | POA: Diagnosis not present

## 2017-07-23 DIAGNOSIS — Z90710 Acquired absence of both cervix and uterus: Secondary | ICD-10-CM | POA: Diagnosis not present

## 2017-07-23 DIAGNOSIS — R2681 Unsteadiness on feet: Secondary | ICD-10-CM | POA: Diagnosis not present

## 2017-07-23 DIAGNOSIS — R29898 Other symptoms and signs involving the musculoskeletal system: Secondary | ICD-10-CM | POA: Diagnosis not present

## 2017-07-23 DIAGNOSIS — D509 Iron deficiency anemia, unspecified: Secondary | ICD-10-CM | POA: Diagnosis not present

## 2017-07-23 DIAGNOSIS — R5383 Other fatigue: Secondary | ICD-10-CM | POA: Diagnosis not present

## 2017-07-23 DIAGNOSIS — S46001A Unspecified injury of muscle(s) and tendon(s) of the rotator cuff of right shoulder, initial encounter: Secondary | ICD-10-CM | POA: Diagnosis not present

## 2017-07-23 DIAGNOSIS — M545 Low back pain: Secondary | ICD-10-CM | POA: Diagnosis not present

## 2017-07-25 DIAGNOSIS — F419 Anxiety disorder, unspecified: Secondary | ICD-10-CM | POA: Diagnosis not present

## 2017-07-25 DIAGNOSIS — M6281 Muscle weakness (generalized): Secondary | ICD-10-CM | POA: Diagnosis not present

## 2017-07-25 DIAGNOSIS — G548 Other nerve root and plexus disorders: Secondary | ICD-10-CM | POA: Diagnosis not present

## 2017-07-25 DIAGNOSIS — Z96659 Presence of unspecified artificial knee joint: Secondary | ICD-10-CM | POA: Diagnosis not present

## 2017-07-25 DIAGNOSIS — J45901 Unspecified asthma with (acute) exacerbation: Secondary | ICD-10-CM | POA: Diagnosis not present

## 2017-07-25 DIAGNOSIS — R05 Cough: Secondary | ICD-10-CM | POA: Diagnosis not present

## 2017-07-25 DIAGNOSIS — R000 Tachycardia, unspecified: Secondary | ICD-10-CM | POA: Diagnosis not present

## 2017-07-25 DIAGNOSIS — M75102 Unspecified rotator cuff tear or rupture of left shoulder, not specified as traumatic: Secondary | ICD-10-CM | POA: Diagnosis not present

## 2017-07-25 DIAGNOSIS — Z947 Corneal transplant status: Secondary | ICD-10-CM | POA: Diagnosis not present

## 2017-07-25 DIAGNOSIS — Z96642 Presence of left artificial hip joint: Secondary | ICD-10-CM | POA: Diagnosis not present

## 2017-07-25 DIAGNOSIS — R29898 Other symptoms and signs involving the musculoskeletal system: Secondary | ICD-10-CM | POA: Diagnosis not present

## 2017-07-25 DIAGNOSIS — R5383 Other fatigue: Secondary | ICD-10-CM | POA: Diagnosis not present

## 2017-07-25 DIAGNOSIS — R2681 Unsteadiness on feet: Secondary | ICD-10-CM | POA: Diagnosis not present

## 2017-07-25 DIAGNOSIS — K219 Gastro-esophageal reflux disease without esophagitis: Secondary | ICD-10-CM | POA: Diagnosis not present

## 2017-07-25 DIAGNOSIS — M545 Low back pain: Secondary | ICD-10-CM | POA: Diagnosis not present

## 2017-07-25 DIAGNOSIS — R296 Repeated falls: Secondary | ICD-10-CM | POA: Diagnosis not present

## 2017-07-25 DIAGNOSIS — J42 Unspecified chronic bronchitis: Secondary | ICD-10-CM | POA: Diagnosis not present

## 2017-07-25 DIAGNOSIS — J209 Acute bronchitis, unspecified: Secondary | ICD-10-CM | POA: Diagnosis not present

## 2017-07-25 DIAGNOSIS — D509 Iron deficiency anemia, unspecified: Secondary | ICD-10-CM | POA: Diagnosis not present

## 2017-07-25 DIAGNOSIS — Z90710 Acquired absence of both cervix and uterus: Secondary | ICD-10-CM | POA: Diagnosis not present

## 2017-07-25 DIAGNOSIS — S46001A Unspecified injury of muscle(s) and tendon(s) of the rotator cuff of right shoulder, initial encounter: Secondary | ICD-10-CM | POA: Diagnosis not present

## 2017-07-25 DIAGNOSIS — K5900 Constipation, unspecified: Secondary | ICD-10-CM | POA: Diagnosis not present

## 2017-07-28 DIAGNOSIS — R296 Repeated falls: Secondary | ICD-10-CM | POA: Diagnosis not present

## 2017-07-28 DIAGNOSIS — M6281 Muscle weakness (generalized): Secondary | ICD-10-CM | POA: Diagnosis not present

## 2017-07-28 DIAGNOSIS — R5383 Other fatigue: Secondary | ICD-10-CM | POA: Diagnosis not present

## 2017-07-28 DIAGNOSIS — R2681 Unsteadiness on feet: Secondary | ICD-10-CM | POA: Diagnosis not present

## 2017-07-28 DIAGNOSIS — M545 Low back pain: Secondary | ICD-10-CM | POA: Diagnosis not present

## 2017-07-28 DIAGNOSIS — F419 Anxiety disorder, unspecified: Secondary | ICD-10-CM | POA: Diagnosis not present

## 2017-07-30 ENCOUNTER — Emergency Department (HOSPITAL_COMMUNITY)
Admission: EM | Admit: 2017-07-30 | Discharge: 2017-07-30 | Disposition: A | Discharge status: Home / Self Care | Payer: Medicare Other | Attending: Emergency Medicine | Admitting: Emergency Medicine

## 2017-07-30 ENCOUNTER — Emergency Department (HOSPITAL_COMMUNITY): Payer: Medicare Other

## 2017-07-30 DIAGNOSIS — Z87891 Personal history of nicotine dependence: Secondary | ICD-10-CM | POA: Diagnosis not present

## 2017-07-30 DIAGNOSIS — M545 Low back pain: Secondary | ICD-10-CM | POA: Diagnosis not present

## 2017-07-30 DIAGNOSIS — Z7982 Long term (current) use of aspirin: Secondary | ICD-10-CM | POA: Diagnosis not present

## 2017-07-30 DIAGNOSIS — M6281 Muscle weakness (generalized): Secondary | ICD-10-CM | POA: Diagnosis not present

## 2017-07-30 DIAGNOSIS — R0602 Shortness of breath: Secondary | ICD-10-CM | POA: Insufficient documentation

## 2017-07-30 DIAGNOSIS — R5383 Other fatigue: Secondary | ICD-10-CM | POA: Diagnosis not present

## 2017-07-30 DIAGNOSIS — R05 Cough: Secondary | ICD-10-CM | POA: Diagnosis not present

## 2017-07-30 DIAGNOSIS — I10 Essential (primary) hypertension: Secondary | ICD-10-CM | POA: Diagnosis not present

## 2017-07-30 DIAGNOSIS — R2681 Unsteadiness on feet: Secondary | ICD-10-CM | POA: Diagnosis not present

## 2017-07-30 DIAGNOSIS — Z79899 Other long term (current) drug therapy: Secondary | ICD-10-CM | POA: Diagnosis not present

## 2017-07-30 DIAGNOSIS — R296 Repeated falls: Secondary | ICD-10-CM | POA: Diagnosis not present

## 2017-07-30 DIAGNOSIS — F419 Anxiety disorder, unspecified: Secondary | ICD-10-CM | POA: Diagnosis not present

## 2017-07-30 LAB — BRAIN NATRIURETIC PEPTIDE: B Natriuretic Peptide: 33 pg/mL (ref 0.0–100.0)

## 2017-07-30 LAB — CBC WITH DIFFERENTIAL/PLATELET
Basophils Absolute: 0 10*3/uL (ref 0.0–0.1)
Basophils Relative: 0 %
Eosinophils Absolute: 0.2 10*3/uL (ref 0.0–0.7)
Eosinophils Relative: 2 %
HCT: 35.8 % — ABNORMAL LOW (ref 36.0–46.0)
HEMOGLOBIN: 12.1 g/dL (ref 12.0–15.0)
LYMPHS ABS: 1.9 10*3/uL (ref 0.7–4.0)
LYMPHS PCT: 23 %
MCH: 32.6 pg (ref 26.0–34.0)
MCHC: 33.8 g/dL (ref 30.0–36.0)
MCV: 96.5 fL (ref 78.0–100.0)
Monocytes Absolute: 1.1 10*3/uL — ABNORMAL HIGH (ref 0.1–1.0)
Monocytes Relative: 14 %
NEUTROS PCT: 61 %
Neutro Abs: 5.2 10*3/uL (ref 1.7–7.7)
Platelets: 325 10*3/uL (ref 150–400)
RBC: 3.71 MIL/uL — AB (ref 3.87–5.11)
RDW: 13.1 % (ref 11.5–15.5)
WBC: 8.4 10*3/uL (ref 4.0–10.5)

## 2017-07-30 LAB — COMPREHENSIVE METABOLIC PANEL
ALK PHOS: 148 U/L — AB (ref 38–126)
ALT: 16 U/L (ref 14–54)
AST: 24 U/L (ref 15–41)
Albumin: 3.4 g/dL — ABNORMAL LOW (ref 3.5–5.0)
Anion gap: 11 (ref 5–15)
BUN: 14 mg/dL (ref 6–20)
CO2: 21 mmol/L — AB (ref 22–32)
Calcium: 8.9 mg/dL (ref 8.9–10.3)
Chloride: 98 mmol/L — ABNORMAL LOW (ref 101–111)
Creatinine, Ser: 0.79 mg/dL (ref 0.44–1.00)
Glucose, Bld: 94 mg/dL (ref 65–99)
Potassium: 4.3 mmol/L (ref 3.5–5.1)
SODIUM: 130 mmol/L — AB (ref 135–145)
Total Bilirubin: 0.3 mg/dL (ref 0.3–1.2)
Total Protein: 6.6 g/dL (ref 6.5–8.1)

## 2017-07-30 LAB — D-DIMER, QUANTITATIVE: D-Dimer, Quant: 1.56 ug/mL-FEU — ABNORMAL HIGH (ref 0.00–0.50)

## 2017-07-30 LAB — I-STAT TROPONIN, ED: Troponin i, poc: 0 ng/mL (ref 0.00–0.08)

## 2017-07-30 MED ORDER — IOPAMIDOL (ISOVUE-370) INJECTION 76%
INTRAVENOUS | Status: AC
  Filled 2017-07-30: qty 100

## 2017-07-30 NOTE — ED Provider Notes (Signed)
[facility] EMERGENCY DEPARTMENT Provider Note   CSN: [id number] Arrival date & time: 07/30/17  1045     History   Chief Complaint Chief Complaint  Patient presents with  . Shortness of Breath    HPI Kristin Solomon is a 84 y.o. female.  Patient is an 84 year old female with a history of anemia, GERD, hypertension, tachycardia, hyperlipidemia presenting today with shortness of breath and cough that started today.  She states this morning may be around 5 AM is when her symptoms started.  She noticed some congestion in her lungs with a dry cough.  She denies any fever.  Patient states she was out walking yesterday and felt great.  She has not had any leg swelling or unilateral leg pain.  She has had no recent immobilization.  She does live at friend's home [place] and is not sure if she has been getting her Lasix or not.  She normally can walk without feeling short of breath.  She has had no flulike symptoms.  She thinks she did receive the flu shot this year.  She has not taken any of her morning medications yet today.  She denies any abdominal pain, nausea, vomiting or diarrhea.   The history is provided by the patient.  Shortness of Breath  This is a new problem. Associated symptoms include cough. Pertinent negatives include no fever, no rhinorrhea, no sore throat, no sputum production, no wheezing, no orthopnea, no chest pain, no leg pain and no leg swelling.    Past Medical History:  Diagnosis Date  . Anemia, iron deficiency    takes Ferrous Sulfate daily  . Depression    takes Effexor daily  . Gait disorder 04/21/2014  . GERD (gastroesophageal reflux disease)    takes Omeprazole daily  . Herpes ocular    history of-takes Acyclovir daily  . High cholesterol    takes Atorvastatin daily  . History of hiatal hernia   . Hypertension    takes Metoprolol daily  . Insomnia    takes Xanax nightly  . Osteoarthritis of knee    bilateral  . Sciatic pain      Patient Active Problem List   Diagnosis Date Noted  . T12 compression fracture ([place]) 05/06/2015  . Wedge compression fracture of T11 vertebra ([place]) 04/10/2015  . Left hip pain 07/13/2014  . GERD (gastroesophageal reflux disease) 07/13/2014  . Dry mouth 07/13/2014  . 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 Gait disorder 04/21/2014  . Constipation 06/21/2013  . Depression with anxiety 06/21/2013  . Diverticulosis of colon without hemorrhage 06/13/2013  . S/P Nissen fundoplication (without gastrostomy tube) procedure 06/13/2013  . Lactose intolerance 06/13/2013  . PVCs (premature ventricular contractions) 06/13/2013  . Fracture of femoral neck, left ([place]) 06/12/2013  . Hip fracture requiring operative repair ([place]) 06/12/2013  . Closed left hip fracture ([place]) 06/12/2013  . Leukocytosis, unspecified 06/12/2013  . Postoperative anemia due to acute blood loss 12/25/2011  . Lumbar radiculopathy, chronic 12/23/2011  . Hyperlipidemia   . Hypertension   . Tachycardia   . Herpes ocular   . Anemia, iron deficiency   . Right knee DJD   . Status post total knee replacement     Past Surgical History:  Procedure Laterality Date  . ABDOMINAL HYSTERECTOMY  1995   partial  . CARPAL TUNNEL RELEASE Right 07/21/2007  . CARPAL TUNNEL RELEASE Left 09/24/2007  . COLONOSCOPY    . DESCEMETS STRIPPING AUTOMATED ENDOTHELIAL KERATOPLASTY Left 03/14/2011  .  DESCEMETS STRIPPING AUTOMATED ENDOTHELIAL KERATOPLASTY Right 08/20/2012  . EYE SURGERY Bilateral    cataract surgery  . EYE SURGERY Left    corneal transplant  . HERNIA REPAIR    . HIP ARTHROPLASTY Left 06/13/2013   Procedure: ARTHROPLASTY BIPOLAR HIP; Injection left shoulder;  Surgeon: Johnny Bridge, MD;  Location: [facility];  Service: Orthopedics;  Laterality: Left;  . JOINT REPLACEMENT     bilateral knees, left knee  . KNEE ARTHROSCOPY Right 05/11/2001  . KYPHOPLASTY N/A 04/10/2015   Procedure: T11 Kyphoplasty;  Surgeon: Jovita Gamma, MD;   Location: [facility];  Service: Neurosurgery;  Laterality: N/A;  T11 Kyphoplasty  . KYPHOPLASTY N/A 05/06/2015   Procedure: KYPHOPLASTY Thoracic twelve;  Surgeon: Jovita Gamma, MD;  Location: [facility];  Service: Neurosurgery;  Laterality: N/A;  T12 Kyphoplasty  . LAPAROSCOPIC NISSEN FUNDOPLICATION  3/87/5643  . LUMBAR LAMINECTOMY/DECOMPRESSION MICRODISCECTOMY  08/29/2010   L2-S1  . SHOULDER ARTHROSCOPY WITH ROTATOR CUFF REPAIR AND SUBACROMIAL DECOMPRESSION Right 01/01/2000  . TONSILLECTOMY     as child  . TOTAL HIP REVISION Left 07/08/2014   Procedure: TOTAL HIP REVISION;  Surgeon: Kerin Salen, MD;  Location: [facility];  Service: Orthopedics;  Laterality: Left;  . TOTAL KNEE ARTHROPLASTY Left 05/14/2004  . TOTAL KNEE ARTHROPLASTY  12/23/2011   Procedure: TOTAL KNEE ARTHROPLASTY;  Surgeon: Lorn Junes, MD;  Location: [facility];  Service: Orthopedics;  Laterality: Right;  DR [place] THIS CASE  . TRIGGER FINGER RELEASE Right 07/21/2007   thumb  . TRIGGER FINGER RELEASE Left 09/24/2007   middle finger  . TRIGGER FINGER RELEASE Right 03/10/2008   ring and little fingers  . TRIGGER FINGER RELEASE Right 09/02/2012   Procedure: RELEASE TRIGGER FINGER/A-1 PULLEY RIGHT INDEX FINGER;  Surgeon: Wynonia Sours, MD;  Location: [facility];  Service: Orthopedics;  Laterality: Right;  . TRIGGER FINGER RELEASE Left 12/22/2012   Procedure: RELEASE TRIGGER FINGER/A-1 PULLEY LEFT RING FINGER;  Surgeon: Wynonia Sours, MD;  Location: [facility];  Service: Orthopedics;  Laterality: Left;  Left     OB History    No data available       Home Medications    Prior to Admission medications   Medication Sig Start Date End Date Taking? Authorizing Provider  acetaminophen (TYLENOL) 500 MG tablet Take 500 mg by mouth every 6 (six) hours as needed for pain.    [provider]  acyclovir (ZOVIRAX) 400 MG tablet Take 400-800 mg by mouth 2 (two) times daily. 800mg   in the morning, 400mg  in the evening 03/30/14   [provider]  ALPRAZolam Duanne Moron) 0.5 MG tablet Take 0.25 mg by mouth at bedtime as needed for sleep.    [provider]  aspirin EC 81 MG tablet Take 81 mg by mouth daily.    [provider]  cholecalciferol (VITAMIN D) 1000 UNITS tablet Take 2,000 Units by mouth daily.    [provider]  docusate sodium (COLACE) 100 MG capsule Take 1 capsule by mouth daily as needed for mild constipation.  06/15/13   [provider]  ferrous sulfate 325 (65 FE) MG tablet Take 325 mg by mouth daily.    [provider]  HYDROcodone-acetaminophen (NORCO/VICODIN) 5-325 MG tablet Take 1-2 tablets by mouth every 4 (four) hours as needed (mild pain). 05/07/15   Jovita Gamma, MD  metoprolol (LOPRESSOR) 50 MG tablet Take 25-50 mg by mouth 2 (two) times daily. Take  50 MG in the morning and take 25 MG at bedtime.    [provider]  omeprazole (PRILOSEC) 40 MG capsule Take 40 mg by mouth 2 (two) times daily.     [provider]  rosuvastatin (CRESTOR) 10 MG tablet Take 10 mg by mouth daily.    [provider]  venlafaxine XR (EFFEXOR-XR) 75 MG 24 hr capsule Take 75 mg by mouth at bedtime.     [provider]    Family History Family History  Problem Relation Age of Onset  . Heart attack Mother   . Parkinsonism Father   . Diabetes Brother     Social History Social History   Tobacco Use  . Smoking status: Former Research scientist (life sciences)  . Smokeless tobacco: Never Used  . Tobacco comment: quit smoking 20-30 yr. ago  Substance Use Topics  . Alcohol use: No  . Drug use: No     Allergies   Morphine and related; Scopace [scopolamine]; Nsaids; and Pravachol [pravastatin sodium]   Review of Systems Review of Systems  Constitutional: Negative for fever.  HENT: Negative for rhinorrhea and sore throat.   Respiratory: Positive for cough and shortness of breath. Negative for sputum  production and wheezing.   Cardiovascular: Negative for chest pain, orthopnea and leg swelling.  All other systems reviewed and are negative.    Physical Exam Updated Vital Signs BP (!) 151/87   Pulse (!) 107   Temp 98.5 F (36.9 C) (Oral)   Resp 18   SpO2 95%   Physical Exam  Constitutional: She is oriented to person, place, and time. She appears well-developed and well-nourished. No distress.  HENT:  Head: Normocephalic and atraumatic.  Mouth/Throat: Oropharynx is clear and moist.  Eyes: Conjunctivae and EOM are normal. Pupils are equal, round, and reactive to light.  Neck: Normal range of motion. Neck supple.  Cardiovascular: Regular rhythm and intact distal pulses. Tachycardia present.  No murmur heard. Pulmonary/Chest: Effort normal and breath sounds normal. Tachypnea noted. No respiratory distress. She has no wheezes. She has no rales.  Coarse breath sounds  Abdominal: Soft. She exhibits no distension. There is no tenderness. There is no rebound and no guarding.  Musculoskeletal: Normal range of motion. She exhibits no edema or tenderness.  Neurological: She is alert and oriented to person, place, and time.  Skin: Skin is warm and dry. No rash noted. No erythema.  Psychiatric: She has a normal mood and affect. Her behavior is normal.  Nursing note and vitals reviewed.    ED Treatments / Results  Labs (all labs ordered are listed, but only abnormal results are displayed) Labs Reviewed  CBC WITH DIFFERENTIAL/PLATELET - Abnormal; Notable for the following components:      Result Value   RBC 3.71 (*)    HCT 35.8 (*)    Monocytes Absolute 1.1 (*)    All other components within normal limits  COMPREHENSIVE METABOLIC PANEL - Abnormal; Notable for the following components:   Sodium 130 (*)    Chloride 98 (*)    CO2 21 (*)    Albumin 3.4 (*)    Alkaline Phosphatase 148 (*)    All other components within normal limits  D-DIMER, QUANTITATIVE (NOT AT [facility]) - Abnormal;  Notable for the following components:   D-Dimer, Quant 1.56 (*)    All other components within normal limits  BRAIN NATRIURETIC PEPTIDE  I-STAT TROPONIN, ED    EKG  EKG Interpretation  Date/Time:  Wednesday July 30 2017 10:50:59 EST  Ventricular Rate:  103 PR Interval:    QRS Duration: 109 QT Interval:  341 QTC Calculation: 447 R Axis:   92 Text Interpretation:  Sinus tachycardia Right axis deviation Low voltage, precordial leads RSR' in V1 or V2, probably normal variant Consider anterolateral infarct Borderline repolarization abnormality Baseline wander in lead(s) V1 Confirmed by Blanchie Dessert [phone]) on 07/30/2017 10:54:10 AM       Radiology Dg Chest 2 View  Result Date: 07/30/2017 CLINICAL DATA:  Shortness of breath, cough EXAM: CHEST  2 VIEW COMPARISON:  04/11/2016 FINDINGS: Left base atelectasis or scarring. No focal opacity on the right. Heart is normal size. Severe kyphosis and multiple moderate to severe compression fractures throughout the thoracic spine. Prior vertebroplasty at 2 levels in the lower thoracic spine. No acute bony abnormality. IMPRESSION: Left base scarring or atelectasis.  No active disease. Electronically Signed   By: Rolm Baptise M.D.   On: 07/30/2017 11:45    Procedures Procedures (including critical care time)  Medications Ordered in ED Medications - No data to display   Initial Impression / Assessment and Plan / ED Course  I have reviewed the triage vital signs and the nursing notes.  Pertinent labs & imaging results that were available during my care of the patient were reviewed by me and considered in my medical decision making (see chart for details).    Elderly female presenting today with more sudden onset of shortness of breath, congestion and cough.  She denies fever.  She states she takes Lasix at her facility however there are no Lasix on her med sheet.  No prior history of CHF.  Patient does have a history of hypertension.  She has  no evidence of lower extremity edema at this time.  Patient is tachypneic and tachycardic.  However she can speak in full sentences.  Coarse breath sounds but no localized wheezes or rales.  Concern for possible CHF, ACS, PE.  Lower suspicion for infection as patient is not having fever.  Chest x-ray, CBC, CMP, BNP, troponin, d-dimer pending  1:52 PM Labs wnl except for elevated dimer.  CXR wnl.  Pt became angry due to wanting her doctor to come here to see here.   Discussed I would contact her PCP but they don't usually come to the ED.  Pt states she wants to go home and she does not want the CT.  Discussed with her she could have a PE and we would not know and she states she doesn't care she wants to go home and will f/u with her PCP.  Pt was able to ambulate to the bathroom on her own without O2 and does not appear to be in any acute distress at this time.  Contacted her PCP who will call her today for a follow up appointment   Final Clinical Impressions(s) / ED Diagnoses   Final diagnoses:  SOB (shortness of breath)    ED Discharge Orders    None       Blanchie Dessert, MD 07/30/17 1400

## 2017-07-30 NOTE — ED Notes (Signed)
Pt refusing CT scan. MD made aware.  

## 2017-07-30 NOTE — ED Notes (Signed)
Patient transported to X-ray 

## 2017-07-30 NOTE — ED Triage Notes (Signed)
Pt in from skilled nursing facility via [facility], per report pt c/o SOB with recent bronchitis dx with SNF withholding abuterol tx d/t increased HR, pt baseline A&O x4, denies pain

## 2017-07-30 NOTE — ED Notes (Signed)
[facility]  They will come and get her.

## 2017-08-01 DIAGNOSIS — M6281 Muscle weakness (generalized): Secondary | ICD-10-CM | POA: Diagnosis not present

## 2017-08-01 DIAGNOSIS — F419 Anxiety disorder, unspecified: Secondary | ICD-10-CM | POA: Diagnosis not present

## 2017-08-01 DIAGNOSIS — R296 Repeated falls: Secondary | ICD-10-CM | POA: Diagnosis not present

## 2017-08-01 DIAGNOSIS — R5383 Other fatigue: Secondary | ICD-10-CM | POA: Diagnosis not present

## 2017-08-01 DIAGNOSIS — R2681 Unsteadiness on feet: Secondary | ICD-10-CM | POA: Diagnosis not present

## 2017-08-01 DIAGNOSIS — M545 Low back pain: Secondary | ICD-10-CM | POA: Diagnosis not present

## 2017-08-03 ENCOUNTER — Emergency Department (HOSPITAL_COMMUNITY): Payer: Medicare Other

## 2017-08-03 ENCOUNTER — Emergency Department (HOSPITAL_COMMUNITY)
Admission: EM | Admit: 2017-08-03 | Discharge: 2017-08-03 | Disposition: A | Discharge status: Skilled Nursing Facility | Payer: Medicare Other | Attending: Emergency Medicine | Admitting: Emergency Medicine

## 2017-08-03 ENCOUNTER — Encounter (HOSPITAL_COMMUNITY): Payer: Self-pay | Admitting: Nurse Practitioner

## 2017-08-03 DIAGNOSIS — N3000 Acute cystitis without hematuria: Secondary | ICD-10-CM | POA: Diagnosis not present

## 2017-08-03 DIAGNOSIS — S79911A Unspecified injury of right hip, initial encounter: Secondary | ICD-10-CM | POA: Diagnosis not present

## 2017-08-03 DIAGNOSIS — R404 Transient alteration of awareness: Secondary | ICD-10-CM | POA: Diagnosis not present

## 2017-08-03 DIAGNOSIS — M545 Low back pain: Secondary | ICD-10-CM | POA: Insufficient documentation

## 2017-08-03 DIAGNOSIS — Z7982 Long term (current) use of aspirin: Secondary | ICD-10-CM | POA: Diagnosis not present

## 2017-08-03 DIAGNOSIS — Y92009 Unspecified place in unspecified non-institutional (private) residence as the place of occurrence of the external cause: Secondary | ICD-10-CM | POA: Insufficient documentation

## 2017-08-03 DIAGNOSIS — M25551 Pain in right hip: Secondary | ICD-10-CM | POA: Insufficient documentation

## 2017-08-03 DIAGNOSIS — W19XXXA Unspecified fall, initial encounter: Secondary | ICD-10-CM

## 2017-08-03 DIAGNOSIS — Y999 Unspecified external cause status: Secondary | ICD-10-CM | POA: Diagnosis not present

## 2017-08-03 DIAGNOSIS — M25552 Pain in left hip: Secondary | ICD-10-CM | POA: Insufficient documentation

## 2017-08-03 DIAGNOSIS — W07XXXA Fall from chair, initial encounter: Secondary | ICD-10-CM | POA: Diagnosis not present

## 2017-08-03 DIAGNOSIS — S79912A Unspecified injury of left hip, initial encounter: Secondary | ICD-10-CM | POA: Diagnosis not present

## 2017-08-03 DIAGNOSIS — I10 Essential (primary) hypertension: Secondary | ICD-10-CM | POA: Diagnosis not present

## 2017-08-03 DIAGNOSIS — Z87891 Personal history of nicotine dependence: Secondary | ICD-10-CM | POA: Insufficient documentation

## 2017-08-03 DIAGNOSIS — R42 Dizziness and giddiness: Secondary | ICD-10-CM | POA: Insufficient documentation

## 2017-08-03 DIAGNOSIS — S3992XA Unspecified injury of lower back, initial encounter: Secondary | ICD-10-CM | POA: Diagnosis present

## 2017-08-03 DIAGNOSIS — Z79899 Other long term (current) drug therapy: Secondary | ICD-10-CM | POA: Diagnosis not present

## 2017-08-03 DIAGNOSIS — G8911 Acute pain due to trauma: Secondary | ICD-10-CM | POA: Diagnosis not present

## 2017-08-03 DIAGNOSIS — R531 Weakness: Secondary | ICD-10-CM | POA: Diagnosis not present

## 2017-08-03 DIAGNOSIS — S199XXA Unspecified injury of neck, initial encounter: Secondary | ICD-10-CM | POA: Diagnosis not present

## 2017-08-03 DIAGNOSIS — S0990XA Unspecified injury of head, initial encounter: Secondary | ICD-10-CM | POA: Diagnosis not present

## 2017-08-03 DIAGNOSIS — Y9389 Activity, other specified: Secondary | ICD-10-CM | POA: Insufficient documentation

## 2017-08-03 DIAGNOSIS — R05 Cough: Secondary | ICD-10-CM | POA: Diagnosis not present

## 2017-08-03 LAB — CBC WITH DIFFERENTIAL/PLATELET
BASOS ABS: 0.1 10*3/uL (ref 0.0–0.1)
BASOS PCT: 1 %
EOS ABS: 0.2 10*3/uL (ref 0.0–0.7)
Eosinophils Relative: 3 %
HEMATOCRIT: 38.8 % (ref 36.0–46.0)
Hemoglobin: 13 g/dL (ref 12.0–15.0)
Lymphocytes Relative: 35 %
Lymphs Abs: 2.4 10*3/uL (ref 0.7–4.0)
MCH: 32.7 pg (ref 26.0–34.0)
MCHC: 33.5 g/dL (ref 30.0–36.0)
MCV: 97.5 fL (ref 78.0–100.0)
MONO ABS: 0.7 10*3/uL (ref 0.1–1.0)
MONOS PCT: 10 %
NEUTROS ABS: 3.5 10*3/uL (ref 1.7–7.7)
NEUTROS PCT: 51 %
Platelets: 375 10*3/uL (ref 150–400)
RBC: 3.98 MIL/uL (ref 3.87–5.11)
RDW: 13.3 % (ref 11.5–15.5)
WBC: 6.8 10*3/uL (ref 4.0–10.5)

## 2017-08-03 LAB — URINALYSIS, ROUTINE W REFLEX MICROSCOPIC
Bilirubin Urine: NEGATIVE
Glucose, UA: NEGATIVE mg/dL
Hgb urine dipstick: NEGATIVE
KETONES UR: NEGATIVE mg/dL
Nitrite: NEGATIVE
PROTEIN: NEGATIVE mg/dL
Specific Gravity, Urine: 1.013 (ref 1.005–1.030)
pH: 8 (ref 5.0–8.0)

## 2017-08-03 LAB — COMPREHENSIVE METABOLIC PANEL
ALK PHOS: 142 U/L — AB (ref 38–126)
ALT: 16 U/L (ref 14–54)
ANION GAP: 9 (ref 5–15)
AST: 23 U/L (ref 15–41)
Albumin: 4 g/dL (ref 3.5–5.0)
BILIRUBIN TOTAL: 0.3 mg/dL (ref 0.3–1.2)
BUN: 17 mg/dL (ref 6–20)
CALCIUM: 9.1 mg/dL (ref 8.9–10.3)
CO2: 26 mmol/L (ref 22–32)
CREATININE: 0.72 mg/dL (ref 0.44–1.00)
Chloride: 96 mmol/L — ABNORMAL LOW (ref 101–111)
GFR calc non Af Amer: >60 mL/min (ref >60)
GLUCOSE: 96 mg/dL (ref 65–99)
Potassium: 4.4 mmol/L (ref 3.5–5.1)
Sodium: 131 mmol/L — ABNORMAL LOW (ref 135–145)
TOTAL PROTEIN: 7.5 g/dL (ref 6.5–8.1)

## 2017-08-03 LAB — I-STAT TROPONIN, ED: Troponin i, poc: 0 ng/mL (ref 0.00–0.08)

## 2017-08-03 MED ORDER — CEPHALEXIN 500 MG PO CAPS: 500.0000 mg | ORAL_CAPSULE | Freq: Two times a day (BID) | ORAL | 0 refills | Status: AC

## 2017-08-03 MED ORDER — CEPHALEXIN 500 MG PO CAPS
500.0000 mg | ORAL_CAPSULE | Freq: Once | ORAL | Status: AC
  Administered 2017-08-03: 500 mg via ORAL
  Filled 2017-08-03: qty 1

## 2017-08-03 NOTE — ED Notes (Signed)
[facility] called for transport.  

## 2017-08-03 NOTE — ED Provider Notes (Signed)
[facility] DEPT Provider Note   CSN: [id number] Arrival date & time: 08/03/17  1733     History   Chief Complaint Chief Complaint  Patient presents with  . Dizziness    HPI Kristin Solomon is a 84 y.o. female presents today for evaluation after a fall.  She reports that she was reaching in her recliner when she leaned too far causing her to fall.  She denies any loss of consciousness, did not strike her head or pass out.  She reports that she slid out of the recliner and landed on her bottom.  She denies any pain at this time.  Patient denies feeling dizzy or off balance.  Patient denied increased urgency, frequency.  She denies recent fevers or feeling unwell.  Patient did repeat the same story immediately after she had just told it during evaluation.   HPI  Past Medical History:  Diagnosis Date  . Anemia, iron deficiency    takes Ferrous Sulfate daily  . Depression    takes Effexor daily  . Gait disorder 04/21/2014  . GERD (gastroesophageal reflux disease)    takes Omeprazole daily  . Herpes ocular    history of-takes Acyclovir daily  . High cholesterol    takes Atorvastatin daily  . History of hiatal hernia   . Hypertension    takes Metoprolol daily  . Insomnia    takes Xanax nightly  . Osteoarthritis of knee    bilateral  . Sciatic pain     Patient Active Problem List   Diagnosis Date Noted  . T12 compression fracture ([place]) 05/06/2015  . Wedge compression fracture of T11 vertebra ([place]) 04/10/2015  . Left hip pain 07/13/2014  . GERD (gastroesophageal reflux disease) 07/13/2014  . Dry mouth 07/13/2014  . 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 Gait disorder 04/21/2014  . Constipation 06/21/2013  . Depression with anxiety 06/21/2013  . Diverticulosis of colon without hemorrhage 06/13/2013  . S/P Nissen fundoplication (without gastrostomy tube) procedure 06/13/2013  . Lactose intolerance 06/13/2013  . PVCs (premature  ventricular contractions) 06/13/2013  . Fracture of femoral neck, left ([place]) 06/12/2013  . Hip fracture requiring operative repair ([place]) 06/12/2013  . Closed left hip fracture ([place]) 06/12/2013  . Leukocytosis, unspecified 06/12/2013  . Postoperative anemia due to acute blood loss 12/25/2011  . Lumbar radiculopathy, chronic 12/23/2011  . Hyperlipidemia   . Hypertension   . Tachycardia   . Herpes ocular   . Anemia, iron deficiency   . Right knee DJD   . Status post total knee replacement     Past Surgical History:  Procedure Laterality Date  . ABDOMINAL HYSTERECTOMY  1995   partial  . CARPAL TUNNEL RELEASE Right 07/21/2007  . CARPAL TUNNEL RELEASE Left 09/24/2007  . COLONOSCOPY    . DESCEMETS STRIPPING AUTOMATED ENDOTHELIAL KERATOPLASTY Left 03/14/2011  . DESCEMETS STRIPPING AUTOMATED ENDOTHELIAL KERATOPLASTY Right 08/20/2012  . EYE SURGERY Bilateral    cataract surgery  . EYE SURGERY Left    corneal transplant  . HERNIA REPAIR    . HIP ARTHROPLASTY Left 06/13/2013   Procedure: ARTHROPLASTY BIPOLAR HIP; Injection left shoulder;  Surgeon: Johnny Bridge, MD;  Location: [facility];  Service: Orthopedics;  Laterality: Left;  . JOINT REPLACEMENT     bilateral knees, left knee  . KNEE ARTHROSCOPY Right 05/11/2001  . KYPHOPLASTY N/A 04/10/2015   Procedure: T11 Kyphoplasty;  Surgeon: Jovita Gamma, MD;  Location: [facility];  Service: Neurosurgery;  Laterality: N/A;  T11 Kyphoplasty  . KYPHOPLASTY N/A 05/06/2015   Procedure: KYPHOPLASTY Thoracic twelve;  Surgeon: Jovita Gamma, MD;  Location: [facility];  Service: Neurosurgery;  Laterality: N/A;  T12 Kyphoplasty  . LAPAROSCOPIC NISSEN FUNDOPLICATION  1/61/0960  . LUMBAR LAMINECTOMY/DECOMPRESSION MICRODISCECTOMY  08/29/2010   L2-S1  . SHOULDER ARTHROSCOPY WITH ROTATOR CUFF REPAIR AND SUBACROMIAL DECOMPRESSION Right 01/01/2000  . TONSILLECTOMY     as child  . TOTAL HIP REVISION Left 07/08/2014   Procedure: TOTAL HIP REVISION;  Surgeon:  Kerin Salen, MD;  Location: [facility];  Service: Orthopedics;  Laterality: Left;  . TOTAL KNEE ARTHROPLASTY Left 05/14/2004  . TOTAL KNEE ARTHROPLASTY  12/23/2011   Procedure: TOTAL KNEE ARTHROPLASTY;  Surgeon: Lorn Junes, MD;  Location: [facility];  Service: Orthopedics;  Laterality: Right;  DR [place] THIS CASE  . TRIGGER FINGER RELEASE Right 07/21/2007   thumb  . TRIGGER FINGER RELEASE Left 09/24/2007   middle finger  . TRIGGER FINGER RELEASE Right 03/10/2008   ring and little fingers  . TRIGGER FINGER RELEASE Right 09/02/2012   Procedure: RELEASE TRIGGER FINGER/A-1 PULLEY RIGHT INDEX FINGER;  Surgeon: Wynonia Sours, MD;  Location: [facility];  Service: Orthopedics;  Laterality: Right;  . TRIGGER FINGER RELEASE Left 12/22/2012   Procedure: RELEASE TRIGGER FINGER/A-1 PULLEY LEFT RING FINGER;  Surgeon: Wynonia Sours, MD;  Location: [facility];  Service: Orthopedics;  Laterality: Left;  Left     OB History    No data available       Home Medications    Prior to Admission medications   Medication Sig Start Date End Date Taking? Authorizing Provider  acetaminophen (TYLENOL) 500 MG tablet Take 500 mg by mouth every 6 (six) hours as needed for pain.    [provider]  acyclovir (ZOVIRAX) 400 MG tablet Take 400-800 mg by mouth 2 (two) times daily. '800mg'$  in the morning, '400mg'$  in the evening 03/30/14   [provider]  ALPRAZolam Duanne Moron) 0.5 MG tablet Take 0.25 mg by mouth at bedtime as needed for sleep.    [provider]  aspirin EC 81 MG tablet Take 81 mg by mouth daily.    [provider]  cephALEXin (KEFLEX) 500 MG capsule Take 1 capsule (500 mg total) by mouth 2 (two) times daily for 7 days. 08/03/17 08/10/17  Lorin Glass, PA-C  cholecalciferol (VITAMIN D) 1000 UNITS tablet Take 2,000 Units by mouth daily.    [provider]  docusate sodium (COLACE) 100 MG capsule Take 1 capsule by mouth daily as  needed for mild constipation.  06/15/13   [provider]  ferrous sulfate 325 (65 FE) MG tablet Take 325 mg by mouth daily.    [provider]  HYDROcodone-acetaminophen (NORCO/VICODIN) 5-325 MG tablet Take 1-2 tablets by mouth every 4 (four) hours as needed (mild pain). 05/07/15   Jovita Gamma, MD  metoprolol (LOPRESSOR) 50 MG tablet Take 25-50 mg by mouth 2 (two) times daily. Take 50 MG in the morning and take 25 MG at bedtime.    [provider]  omeprazole (PRILOSEC) 40 MG capsule Take 40 mg by mouth 2 (two) times daily.     [provider]  rosuvastatin (CRESTOR) 10 MG tablet Take 10 mg by mouth daily.    [provider]  venlafaxine XR (EFFEXOR-XR) 75 MG 24 hr capsule Take 75 mg by mouth at bedtime.     [provider]  Family History Family History  Problem Relation Age of Onset  . Heart attack Mother   . Parkinsonism Father   . Diabetes Brother     Social History Social History   Tobacco Use  . Smoking status: Former Research scientist (life sciences)  . Smokeless tobacco: Never Used  . Tobacco comment: quit smoking 20-30 yr. ago  Substance Use Topics  . Alcohol use: No  . Drug use: Yes     Allergies   Morphine and related; Scopace [scopolamine]; Nsaids; and Pravachol [pravastatin sodium]   Review of Systems Review of Systems  Constitutional: Negative for chills and fever.  HENT: Negative for ear pain and sore throat.   Eyes: Negative for pain and visual disturbance.  Respiratory: Negative for cough and shortness of breath.   Cardiovascular: Negative for chest pain and palpitations.  Gastrointestinal: Negative for abdominal pain, nausea and vomiting.  Genitourinary: Negative for dysuria, frequency, hematuria and urgency.  Musculoskeletal: Negative for arthralgias, back pain and neck stiffness.  Skin: Negative for color change and rash.  Neurological: Negative for seizures, syncope and headaches.  All other systems reviewed and are  negative.    Physical Exam Updated Vital Signs BP 137/72   Pulse 86   Temp 98.3 F (36.8 C) (Oral)   Resp 17   SpO2 94%   Physical Exam  Constitutional: She appears well-developed and well-nourished. No distress.  HENT:  Head: Normocephalic and atraumatic.  Mouth/Throat: Oropharynx is clear and moist.  Eyes: Conjunctivae and EOM are normal. Pupils are equal, round, and reactive to light.  Neck: Normal range of motion. Neck supple.  Cardiovascular: Normal rate, regular rhythm and intact distal pulses.  No murmur heard. Pulmonary/Chest: Effort normal and breath sounds normal. No respiratory distress.  Abdominal: Soft. There is no tenderness.  Musculoskeletal: She exhibits no edema.  There is no midline tenderness to palpation through entire spine.  There is no localized tenderness to palpation along bilateral buttocks, over sacrum/coccyx.  5/5 strength in bilateral lower extremities, gait is steady.  Neurological: She is alert.  Patient is awake and alert, oriented to person, place, and time.  She did have one episode where she told a very long story, and then immediately repeated that story without apparent recollection that she had just told it.  The patient gait without ataxia or limp.  No obvious facial droop, 5/5 strength in bilateral upper and lower extremities.  Skin: Skin is warm and dry.  Psychiatric: Her mood appears anxious. Her affect is angry. She is agitated.  Nursing note and vitals reviewed.    ED Treatments / Results  Labs (all labs ordered are listed, but only abnormal results are displayed) Labs Reviewed  COMPREHENSIVE METABOLIC PANEL - Abnormal; Notable for the following components:      Result Value   Sodium 131 (*)    Chloride 96 (*)    Alkaline Phosphatase 142 (*)    All other components within normal limits  URINALYSIS, ROUTINE W REFLEX MICROSCOPIC - Abnormal; Notable for the following components:   APPearance CLOUDY (*)    Leukocytes, UA LARGE (*)     Bacteria, UA RARE (*)    Squamous Epithelial / LPF 0-5 (*)    All other components within normal limits  URINE CULTURE  CBC WITH DIFFERENTIAL/PLATELET  I-STAT TROPONIN, ED    EKG  EKG Interpretation None       Radiology Dg Lumbar Spine Complete  Result Date: 08/03/2017 CLINICAL DATA:  Slid out of her chair onto the floor and  could not get back up, low back pain and BILATERAL hip pain EXAM: LUMBAR SPINE - COMPLETE 4+ VIEW COMPARISON:  03/13/2017 FINDINGS: Five non-rib-bearing lumbar vertebra. Marked osseous demineralization. Multilevel disc space narrowing greatest at L4-L5. Minimal superior endplate height loss at L4 vertebral body appears unchanged. Prior height losses of T11 and T12 with prior spinal augmentation procedures. Minimal anterolisthesis at L4-L5, unchanged. No acute fracture, subluxation or bone destruction. Facet degenerative changes lower lumbar spine. Mild broad-based dextroconvex lumbar scoliosis. SI joints preserved. Atherosclerotic calcification aorta. IMPRESSION: Osseous demineralization with degenerative disc and facet disease changes of the lumbar spine. Old compression fractures at T11, T12 and superior endplate L4 with prior spinal augmentation procedures at T11 and T12. No acute abnormalities. Electronically Signed   By: Lavonia Dana M.D.   On: 08/03/2017 19:29   Ct Head Wo Contrast  Result Date: 08/03/2017 CLINICAL DATA:  Dizziness with fall EXAM: CT HEAD WITHOUT CONTRAST CT CERVICAL SPINE WITHOUT CONTRAST TECHNIQUE: Multidetector CT imaging of the head and cervical spine was performed following the standard protocol without intravenous contrast. Multiplanar CT image reconstructions of the cervical spine were also generated. COMPARISON:  CT head and CT cervical spine June 12, 2013 FINDINGS: CT HEAD FINDINGS Brain: There is moderate diffuse atrophy. There is no intracranial mass, hemorrhage, extra-axial fluid collection, or midline shift. There is patchy small  vessel disease throughout the centra semiovale bilaterally. Small vessel disease is also noted in the anterior limb of each external capsule. There is no new gray-white compartment lesion. No evident acute infarct. Vascular: There is no appreciable hyperdense vessel. There is no appreciable vascular calcification. Skull: The bony calvarium appears intact. Sinuses/Orbits: There is mild mucosal thickening in several ethmoid air cells. Other visualized paranasal sinuses are clear. Orbits appear symmetric bilaterally. Other: Mastoid air cells are clear. CT CERVICAL SPINE FINDINGS Alignment: There is 1 mm of retrolisthesis of C3 on C4. There is 2 mm of retrolisthesis of C4 on C5. No other spondylolisthesis is evident. Skull base and vertebrae: Skull base and craniocervical junction regions appear normal. There is moderate pannus posterior to the odontoid which is not causing appreciable impression on the craniocervical junction. Bones are osteoporotic. There is no evident acute fracture. No blastic or lytic bone lesions are evident. Soft tissues and spinal canal: The prevertebral soft tissues and predental space regions are within normal limits. No paraspinous lesions. No evident cord or canal hematoma. Disc levels: There is severe disc space narrowing at C4-5. There is moderate disc space narrowing at C3-4 and C5-6. There are prominent anterior osteophytes at C3, C4, and C5. There is exit foraminal narrowing due to bony hypertrophy at C4-5 on the right and at C5-6 on the left impressing on the respective exiting nerve roots at these levels. There is no frank disc extrusion or high-grade stenosis. Upper chest: Visualized upper lung zones are clear. Other: There is atherosclerotic calcification in both carotid arteries, more on the left than on the right. Calcifications also noted in both proximal subclavian arteries. IMPRESSION: CT head: Stable atrophy with patchy periventricular small vessel disease. No acute infarct.  No mass or hemorrhage. There is mild mucosal thickening in several ethmoid air cells. CT cervical spine: No appreciable fracture. Areas of mild spondylolisthesis are felt to be due to underlying spondylosis. There are multiple foci of spondylosis/osteoarthritis. Bones are osteoporotic. There is bilateral carotid artery calcification as well as foci of calcification in each proximal subclavian artery. Electronically Signed   By: Lowella Grip III M.D.  On: 08/03/2017 19:45   Ct Cervical Spine Wo Contrast  Result Date: 08/03/2017 CLINICAL DATA:  Dizziness with fall EXAM: CT HEAD WITHOUT CONTRAST CT CERVICAL SPINE WITHOUT CONTRAST TECHNIQUE: Multidetector CT imaging of the head and cervical spine was performed following the standard protocol without intravenous contrast. Multiplanar CT image reconstructions of the cervical spine were also generated. COMPARISON:  CT head and CT cervical spine June 12, 2013 FINDINGS: CT HEAD FINDINGS Brain: There is moderate diffuse atrophy. There is no intracranial mass, hemorrhage, extra-axial fluid collection, or midline shift. There is patchy small vessel disease throughout the centra semiovale bilaterally. Small vessel disease is also noted in the anterior limb of each external capsule. There is no new gray-white compartment lesion. No evident acute infarct. Vascular: There is no appreciable hyperdense vessel. There is no appreciable vascular calcification. Skull: The bony calvarium appears intact. Sinuses/Orbits: There is mild mucosal thickening in several ethmoid air cells. Other visualized paranasal sinuses are clear. Orbits appear symmetric bilaterally. Other: Mastoid air cells are clear. CT CERVICAL SPINE FINDINGS Alignment: There is 1 mm of retrolisthesis of C3 on C4. There is 2 mm of retrolisthesis of C4 on C5. No other spondylolisthesis is evident. Skull base and vertebrae: Skull base and craniocervical junction regions appear normal. There is moderate pannus  posterior to the odontoid which is not causing appreciable impression on the craniocervical junction. Bones are osteoporotic. There is no evident acute fracture. No blastic or lytic bone lesions are evident. Soft tissues and spinal canal: The prevertebral soft tissues and predental space regions are within normal limits. No paraspinous lesions. No evident cord or canal hematoma. Disc levels: There is severe disc space narrowing at C4-5. There is moderate disc space narrowing at C3-4 and C5-6. There are prominent anterior osteophytes at C3, C4, and C5. There is exit foraminal narrowing due to bony hypertrophy at C4-5 on the right and at C5-6 on the left impressing on the respective exiting nerve roots at these levels. There is no frank disc extrusion or high-grade stenosis. Upper chest: Visualized upper lung zones are clear. Other: There is atherosclerotic calcification in both carotid arteries, more on the left than on the right. Calcifications also noted in both proximal subclavian arteries. IMPRESSION: CT head: Stable atrophy with patchy periventricular small vessel disease. No acute infarct. No mass or hemorrhage. There is mild mucosal thickening in several ethmoid air cells. CT cervical spine: No appreciable fracture. Areas of mild spondylolisthesis are felt to be due to underlying spondylosis. There are multiple foci of spondylosis/osteoarthritis. Bones are osteoporotic. There is bilateral carotid artery calcification as well as foci of calcification in each proximal subclavian artery. Electronically Signed   By: Lowella Grip III M.D.   On: 08/03/2017 19:45   Dg Chest Port 1 View  Result Date: 08/03/2017 CLINICAL DATA:  Dizziness, anxiety, hypertension, cough EXAM: PORTABLE CHEST 1 VIEW COMPARISON:  Portable exam 1800 hours compared to 07/30/2017 FINDINGS: Borderline enlargement of cardiac silhouette. Atherosclerotic calcification aorta. Mediastinal contours and pulmonary vascularity normal. Minimal  LEFT basilar scarring. Lungs otherwise clear. No infiltrate, pleural effusion or pneumothorax. Bones demineralized. IMPRESSION: Minimal LEFT basilar scarring Electronically Signed   By: Lavonia Dana M.D.   On: 08/03/2017 18:23   Dg Hips Bilat W Or Wo Pelvis 2 Views  Result Date: 08/03/2017 CLINICAL DATA:  Slid out of her chair onto the floor and could not get back up, low back pain and BILATERAL hip pain EXAM: DG HIP (WITH OR WITHOUT PELVIS) 2V BILAT COMPARISON:  None FINDINGS: Marked osseous demineralization. SI joints and RIGHT hip joint space preserved. LEFT hip prosthesis noted without periprosthetic lucency. No acute fracture, dislocation, or bone destruction. Numerous pelvic phleboliths. IMPRESSION: Osseous demineralization and LEFT hip prosthesis. No acute abnormalities. Electronically Signed   By: Lavonia Dana M.D.   On: 08/03/2017 19:27    Procedures Procedures (including critical care time)  Medications Ordered in ED Medications  cephALEXin (KEFLEX) capsule 500 mg (500 mg Oral Given 08/03/17 2218)     Initial Impression / Assessment and Plan / ED Course  I have reviewed the triage vital signs and the nursing notes.  Pertinent labs & imaging results that were available during my care of the patient were reviewed by me and considered in my medical decision making (see chart for details).  Clinical Course as of Aug 04 28  Sun Aug 03, 2017  1758 Attempted to see patient, in the bathroom.   [EH]  2133 Patient is wanting to leave, is requesting removal of the saline lock, gave verbal order for removal of saline lock.  Reports her walker was brought with her, wants it to go to bathroom.  RN notified.   [EH]    Clinical Course User Index [EH] Shalen, Petrak, PA-C    Ranee Gosselin presents today for evaluation after a fall where she reportedly slid out of her recliner landing on her buttocks.  She does not report any significant pains in these areas.  Unsure how accurate  patient is as a historian, as she repeated the same story immediately after telling it.  CT head and neck were obtained without acute abnormality.  Chest x-ray was obtained without obvious infiltrate.  EKG obtained and reviewed.  Troponin normal.  Alk phos is mildly elevated at 142, patient does not have any abdominal pain.  No elevated white count or anemia.  Urine is cloudy with large leukocytes, rare bacteria, 0-5 squamous epithelial cells.  The patient was given 1 dose of Keflex in the emergency room, and a prescription for her remaining course.  Urine culture was sent.  She was hemodynamically stable for discharge back to her assisted living facility.  Informed of elevated BP, need to follow up with PCP and have BP check.    At this time there does not appear to be any evidence of an acute emergency medical condition and the patient appears stable for discharge with appropriate outpatient follow up.Diagnosis was discussed with patient who verbalizes understanding and is agreeable to discharge. Pt case discussed with and seen by Dr. Wilson Singer who agrees with my plan.    Final Clinical Impressions(s) / ED Diagnoses   Final diagnoses:  Fall, initial encounter  Acute cystitis without hematuria    ED Discharge Orders        Ordered    cephALEXin (KEFLEX) 500 MG capsule  2 times daily     08/03/17 2158       Jaeli, Grubb, Hershal Coria 08/04/17 Her[facility] [place]    Virgel Manifold, MD 08/05/17 [phone]

## 2017-08-03 NOTE — ED Notes (Signed)
Bed: WA07 Expected date:  Expected time:  Means of arrival:  Comments: 84 yo weakness, dizziness

## 2017-08-03 NOTE — ED Triage Notes (Signed)
Pt is presented from [facility], she states she got anxious and then got severely dizzy. She denies CP, SOB, NVD or malaise. Recently (07/30/17) for similar complaints.

## 2017-08-03 NOTE — Discharge Instructions (Signed)
Please follow up with your doctor.   If you have any concerns then please get additional medical evaluation.

## 2017-08-04 DIAGNOSIS — R296 Repeated falls: Secondary | ICD-10-CM | POA: Diagnosis not present

## 2017-08-04 DIAGNOSIS — R5383 Other fatigue: Secondary | ICD-10-CM | POA: Diagnosis not present

## 2017-08-04 DIAGNOSIS — F419 Anxiety disorder, unspecified: Secondary | ICD-10-CM | POA: Diagnosis not present

## 2017-08-04 DIAGNOSIS — M545 Low back pain: Secondary | ICD-10-CM | POA: Diagnosis not present

## 2017-08-04 DIAGNOSIS — M6281 Muscle weakness (generalized): Secondary | ICD-10-CM | POA: Diagnosis not present

## 2017-08-04 DIAGNOSIS — R2681 Unsteadiness on feet: Secondary | ICD-10-CM | POA: Diagnosis not present

## 2017-08-05 DIAGNOSIS — F419 Anxiety disorder, unspecified: Secondary | ICD-10-CM | POA: Diagnosis not present

## 2017-08-05 DIAGNOSIS — M545 Low back pain: Secondary | ICD-10-CM | POA: Diagnosis not present

## 2017-08-05 DIAGNOSIS — R296 Repeated falls: Secondary | ICD-10-CM | POA: Diagnosis not present

## 2017-08-05 DIAGNOSIS — R2681 Unsteadiness on feet: Secondary | ICD-10-CM | POA: Diagnosis not present

## 2017-08-05 DIAGNOSIS — R5383 Other fatigue: Secondary | ICD-10-CM | POA: Diagnosis not present

## 2017-08-05 DIAGNOSIS — M6281 Muscle weakness (generalized): Secondary | ICD-10-CM | POA: Diagnosis not present

## 2017-08-05 LAB — URINE CULTURE: Culture: <10000 — AB

## 2017-08-06 DIAGNOSIS — M6281 Muscle weakness (generalized): Secondary | ICD-10-CM | POA: Diagnosis not present

## 2017-08-06 DIAGNOSIS — R2681 Unsteadiness on feet: Secondary | ICD-10-CM | POA: Diagnosis not present

## 2017-08-06 DIAGNOSIS — R5383 Other fatigue: Secondary | ICD-10-CM | POA: Diagnosis not present

## 2017-08-06 DIAGNOSIS — R296 Repeated falls: Secondary | ICD-10-CM | POA: Diagnosis not present

## 2017-08-06 DIAGNOSIS — M545 Low back pain: Secondary | ICD-10-CM | POA: Diagnosis not present

## 2017-08-06 DIAGNOSIS — F419 Anxiety disorder, unspecified: Secondary | ICD-10-CM | POA: Diagnosis not present

## 2017-08-07 DIAGNOSIS — R5383 Other fatigue: Secondary | ICD-10-CM | POA: Diagnosis not present

## 2017-08-07 DIAGNOSIS — M545 Low back pain: Secondary | ICD-10-CM | POA: Diagnosis not present

## 2017-08-07 DIAGNOSIS — F419 Anxiety disorder, unspecified: Secondary | ICD-10-CM | POA: Diagnosis not present

## 2017-08-07 DIAGNOSIS — M6281 Muscle weakness (generalized): Secondary | ICD-10-CM | POA: Diagnosis not present

## 2017-08-07 DIAGNOSIS — R2681 Unsteadiness on feet: Secondary | ICD-10-CM | POA: Diagnosis not present

## 2017-08-07 DIAGNOSIS — R296 Repeated falls: Secondary | ICD-10-CM | POA: Diagnosis not present

## 2017-08-08 ENCOUNTER — Other Ambulatory Visit: Payer: Self-pay

## 2017-08-08 ENCOUNTER — Emergency Department (HOSPITAL_COMMUNITY)
Admission: EM | Admit: 2017-08-08 | Discharge: 2017-08-08 | Disposition: A | Discharge status: Home / Self Care | Payer: Medicare Other | Attending: Emergency Medicine | Admitting: Emergency Medicine

## 2017-08-08 ENCOUNTER — Emergency Department (HOSPITAL_COMMUNITY): Payer: Medicare Other

## 2017-08-08 DIAGNOSIS — R450 Nervousness: Secondary | ICD-10-CM | POA: Diagnosis not present

## 2017-08-08 DIAGNOSIS — Z87891 Personal history of nicotine dependence: Secondary | ICD-10-CM | POA: Insufficient documentation

## 2017-08-08 DIAGNOSIS — G4710 Hypersomnia, unspecified: Secondary | ICD-10-CM | POA: Diagnosis not present

## 2017-08-08 DIAGNOSIS — M545 Low back pain: Secondary | ICD-10-CM | POA: Insufficient documentation

## 2017-08-08 DIAGNOSIS — R4182 Altered mental status, unspecified: Secondary | ICD-10-CM | POA: Diagnosis not present

## 2017-08-08 DIAGNOSIS — Z79899 Other long term (current) drug therapy: Secondary | ICD-10-CM | POA: Diagnosis not present

## 2017-08-08 DIAGNOSIS — Z96642 Presence of left artificial hip joint: Secondary | ICD-10-CM | POA: Diagnosis not present

## 2017-08-08 DIAGNOSIS — R404 Transient alteration of awareness: Secondary | ICD-10-CM | POA: Diagnosis not present

## 2017-08-08 DIAGNOSIS — D500 Iron deficiency anemia secondary to blood loss (chronic): Secondary | ICD-10-CM | POA: Diagnosis not present

## 2017-08-08 DIAGNOSIS — I10 Essential (primary) hypertension: Secondary | ICD-10-CM | POA: Diagnosis not present

## 2017-08-08 DIAGNOSIS — J40 Bronchitis, not specified as acute or chronic: Secondary | ICD-10-CM | POA: Diagnosis not present

## 2017-08-08 DIAGNOSIS — R000 Tachycardia, unspecified: Secondary | ICD-10-CM | POA: Diagnosis not present

## 2017-08-08 DIAGNOSIS — M6281 Muscle weakness (generalized): Secondary | ICD-10-CM | POA: Diagnosis present

## 2017-08-08 DIAGNOSIS — Z96653 Presence of artificial knee joint, bilateral: Secondary | ICD-10-CM | POA: Diagnosis not present

## 2017-08-08 DIAGNOSIS — R05 Cough: Secondary | ICD-10-CM | POA: Diagnosis not present

## 2017-08-08 DIAGNOSIS — Z7982 Long term (current) use of aspirin: Secondary | ICD-10-CM | POA: Diagnosis not present

## 2017-08-08 DIAGNOSIS — F0390 Unspecified dementia without behavioral disturbance: Secondary | ICD-10-CM | POA: Diagnosis not present

## 2017-08-08 DIAGNOSIS — S299XXA Unspecified injury of thorax, initial encounter: Secondary | ICD-10-CM | POA: Diagnosis not present

## 2017-08-08 DIAGNOSIS — R531 Weakness: Secondary | ICD-10-CM | POA: Diagnosis not present

## 2017-08-08 LAB — CBC WITH DIFFERENTIAL/PLATELET
BASOS ABS: 0 10*3/uL (ref 0.0–0.1)
Basophils Relative: 1 %
Eosinophils Absolute: 0.1 10*3/uL (ref 0.0–0.7)
Eosinophils Relative: 2 %
HEMATOCRIT: 39.5 % (ref 36.0–46.0)
HEMOGLOBIN: 13.1 g/dL (ref 12.0–15.0)
LYMPHS PCT: 27 %
Lymphs Abs: 2.1 10*3/uL (ref 0.7–4.0)
MCH: 32.4 pg (ref 26.0–34.0)
MCHC: 33.2 g/dL (ref 30.0–36.0)
MCV: 97.8 fL (ref 78.0–100.0)
MONO ABS: 0.9 10*3/uL (ref 0.1–1.0)
MONOS PCT: 11 %
NEUTROS ABS: 4.8 10*3/uL (ref 1.7–7.7)
Neutrophils Relative %: 59 %
Platelets: 318 10*3/uL (ref 150–400)
RBC: 4.04 MIL/uL (ref 3.87–5.11)
RDW: 13.2 % (ref 11.5–15.5)
WBC: 8 10*3/uL (ref 4.0–10.5)

## 2017-08-08 LAB — URINALYSIS, ROUTINE W REFLEX MICROSCOPIC
BILIRUBIN URINE: NEGATIVE
GLUCOSE, UA: NEGATIVE mg/dL
HGB URINE DIPSTICK: NEGATIVE
KETONES UR: NEGATIVE mg/dL
NITRITE: NEGATIVE
PH: 8 (ref 5.0–8.0)
PROTEIN: NEGATIVE mg/dL
Specific Gravity, Urine: 1.01 (ref 1.005–1.030)

## 2017-08-08 LAB — BASIC METABOLIC PANEL
ANION GAP: 12 (ref 5–15)
BUN: 11 mg/dL (ref 6–20)
CALCIUM: 9.1 mg/dL (ref 8.9–10.3)
CHLORIDE: 96 mmol/L — AB (ref 101–111)
CO2: 22 mmol/L (ref 22–32)
Creatinine, Ser: 0.67 mg/dL (ref 0.44–1.00)
GFR calc non Af Amer: >60 mL/min (ref >60)
GLUCOSE: 91 mg/dL (ref 65–99)
Potassium: 4.5 mmol/L (ref 3.5–5.1)
Sodium: 130 mmol/L — ABNORMAL LOW (ref 135–145)

## 2017-08-08 LAB — I-STAT TROPONIN, ED: Troponin i, poc: 0 ng/mL (ref 0.00–0.08)

## 2017-08-08 MED ORDER — ALBUTEROL SULFATE HFA 108 (90 BASE) MCG/ACT IN AERS
2.0000 | INHALATION_SPRAY | Freq: Once | RESPIRATORY_TRACT | Status: AC
  Administered 2017-08-08: 2 via RESPIRATORY_TRACT
  Filled 2017-08-08: qty 6.7

## 2017-08-08 MED ORDER — IPRATROPIUM-ALBUTEROL 0.5-2.5 (3) MG/3ML IN SOLN
3.0000 mL | Freq: Once | RESPIRATORY_TRACT | Status: AC
  Administered 2017-08-08: 3 mL via RESPIRATORY_TRACT
  Filled 2017-08-08: qty 3

## 2017-08-08 MED ORDER — IOPAMIDOL (ISOVUE-370) INJECTION 76%
INTRAVENOUS | Status: AC
  Administered 2017-08-08: 60 mL via INTRAVENOUS
  Filled 2017-08-08: qty 100

## 2017-08-08 MED ORDER — DEXAMETHASONE 4 MG PO TABS
10.0000 mg | ORAL_TABLET | Freq: Once | ORAL | Status: AC
  Administered 2017-08-08: 10 mg via ORAL
  Filled 2017-08-08: qty 3

## 2017-08-08 MED ORDER — OPTICHAMBER DIAMOND MISC
1.0000 | Freq: Once | Status: AC
  Administered 2017-08-08: 1
  Filled 2017-08-08: qty 1

## 2017-08-08 MED ORDER — LORAZEPAM 2 MG/ML IJ SOLN
0.2500 mg | Freq: Once | INTRAMUSCULAR | Status: AC
  Administered 2017-08-08: 0.25 mg via INTRAVENOUS
  Filled 2017-08-08: qty 1

## 2017-08-08 MED ORDER — BENZONATATE 100 MG PO CAPS: 100.0000 mg | ORAL_CAPSULE | Freq: Three times a day (TID) | ORAL | 0 refills | Status: DC | PRN

## 2017-08-08 MED ORDER — DOXYCYCLINE HYCLATE 100 MG PO CAPS: 100.0000 mg | ORAL_CAPSULE | Freq: Two times a day (BID) | ORAL | 0 refills | Status: AC

## 2017-08-08 MED ORDER — DOXYCYCLINE HYCLATE 100 MG PO TABS
100.0000 mg | ORAL_TABLET | Freq: Once | ORAL | Status: AC
  Administered 2017-08-08: 100 mg via ORAL
  Filled 2017-08-08: qty 1

## 2017-08-08 MED ORDER — SODIUM CHLORIDE 0.9 % IV BOLUS (SEPSIS)
500.0000 mL | Freq: Once | INTRAVENOUS | Status: AC
  Administered 2017-08-08: 500 mL via INTRAVENOUS

## 2017-08-08 NOTE — ED Notes (Signed)
Pt ambulated in hall with no problems. 

## 2017-08-08 NOTE — ED Notes (Signed)
Patient transported to CT 

## 2017-08-08 NOTE — ED Provider Notes (Signed)
[facility] EMERGENCY DEPARTMENT Provider Note   CSN: [id number] Arrival date & time: 08/08/17  1031     History   Chief Complaint Chief Complaint  Patient presents with  . Weakness    HPI Kristin Solomon is a 84 y.o. female.  HPI   84 year old female with past medical history as below here for fall.  The patient states that she was in her usual state of health this morning.  She was sitting in her recliner at her nursing facility.  She tried to bend forward to grab a cup of water on a table.  She states that the table was further away than she thought so she lost her balance and reportedly slid down off the smooth recliner.  She fell directly onto her buttocks.  She did not hit her head.  She tried to get up but had difficulty so called for help.  She was subsequent sent for evaluation.  Patient strongly denies any head injury.  She strongly denies any significant complaints.  She does have a mild aching lower back pain but this is chronic.  No lower extremity numbness or weakness.  There is no direct abdominal trauma.  Denies any fevers or chills.  She has had a mild, dry cough for the last several days, but denies any sputum production or shortness of breath.  No chest pain.  She states she otherwise feels well and at her baseline.  Past Medical History:  Diagnosis Date  . Anemia, iron deficiency    takes Ferrous Sulfate daily  . Depression    takes Effexor daily  . Gait disorder 04/21/2014  . GERD (gastroesophageal reflux disease)    takes Omeprazole daily  . Herpes ocular    history of-takes Acyclovir daily  . High cholesterol    takes Atorvastatin daily  . History of hiatal hernia   . Hypertension    takes Metoprolol daily  . Insomnia    takes Xanax nightly  . Osteoarthritis of knee    bilateral  . Sciatic pain     Patient Active Problem List   Diagnosis Date Noted  . T12 compression fracture ([place]) 05/06/2015  . Wedge compression fracture  of T11 vertebra ([place]) 04/10/2015  . Left hip pain 07/13/2014  . GERD (gastroesophageal reflux disease) 07/13/2014  . Dry mouth 07/13/2014  . 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 Gait disorder 04/21/2014  . Constipation 06/21/2013  . Depression with anxiety 06/21/2013  . Diverticulosis of colon without hemorrhage 06/13/2013  . S/P Nissen fundoplication (without gastrostomy tube) procedure 06/13/2013  . Lactose intolerance 06/13/2013  . PVCs (premature ventricular contractions) 06/13/2013  . Fracture of femoral neck, left ([place]) 06/12/2013  . Hip fracture requiring operative repair ([place]) 06/12/2013  . Closed left hip fracture ([place]) 06/12/2013  . Leukocytosis, unspecified 06/12/2013  . Postoperative anemia due to acute blood loss 12/25/2011  . Lumbar radiculopathy, chronic 12/23/2011  . Hyperlipidemia   . Hypertension   . Tachycardia   . Herpes ocular   . Anemia, iron deficiency   . Right knee DJD   . Status post total knee replacement     Past Surgical History:  Procedure Laterality Date  . ABDOMINAL HYSTERECTOMY  1995   partial  . CARPAL TUNNEL RELEASE Right 07/21/2007  . CARPAL TUNNEL RELEASE Left 09/24/2007  . COLONOSCOPY    . DESCEMETS STRIPPING AUTOMATED ENDOTHELIAL KERATOPLASTY Left 03/14/2011  . DESCEMETS STRIPPING AUTOMATED ENDOTHELIAL KERATOPLASTY Right 08/20/2012  . EYE SURGERY Bilateral  cataract surgery  . EYE SURGERY Left    corneal transplant  . HERNIA REPAIR    . HIP ARTHROPLASTY Left 06/13/2013   Procedure: ARTHROPLASTY BIPOLAR HIP; Injection left shoulder;  Surgeon: Johnny Bridge, MD;  Location: [facility];  Service: Orthopedics;  Laterality: Left;  . JOINT REPLACEMENT     bilateral knees, left knee  . KNEE ARTHROSCOPY Right 05/11/2001  . KYPHOPLASTY N/A 04/10/2015   Procedure: T11 Kyphoplasty;  Surgeon: Jovita Gamma, MD;  Location: [facility];  Service: Neurosurgery;  Laterality: N/A;  T11 Kyphoplasty  . KYPHOPLASTY N/A 05/06/2015   Procedure:  KYPHOPLASTY Thoracic twelve;  Surgeon: Jovita Gamma, MD;  Location: [facility];  Service: Neurosurgery;  Laterality: N/A;  T12 Kyphoplasty  . LAPAROSCOPIC NISSEN FUNDOPLICATION  6/38/9373  . LUMBAR LAMINECTOMY/DECOMPRESSION MICRODISCECTOMY  08/29/2010   L2-S1  . SHOULDER ARTHROSCOPY WITH ROTATOR CUFF REPAIR AND SUBACROMIAL DECOMPRESSION Right 01/01/2000  . TONSILLECTOMY     as child  . TOTAL HIP REVISION Left 07/08/2014   Procedure: TOTAL HIP REVISION;  Surgeon: Kerin Salen, MD;  Location: [facility];  Service: Orthopedics;  Laterality: Left;  . TOTAL KNEE ARTHROPLASTY Left 05/14/2004  . TOTAL KNEE ARTHROPLASTY  12/23/2011   Procedure: TOTAL KNEE ARTHROPLASTY;  Surgeon: Lorn Junes, MD;  Location: [facility];  Service: Orthopedics;  Laterality: Right;  DR [place] THIS CASE  . TRIGGER FINGER RELEASE Right 07/21/2007   thumb  . TRIGGER FINGER RELEASE Left 09/24/2007   middle finger  . TRIGGER FINGER RELEASE Right 03/10/2008   ring and little fingers  . TRIGGER FINGER RELEASE Right 09/02/2012   Procedure: RELEASE TRIGGER FINGER/A-1 PULLEY RIGHT INDEX FINGER;  Surgeon: Wynonia Sours, MD;  Location: [facility];  Service: Orthopedics;  Laterality: Right;  . TRIGGER FINGER RELEASE Left 12/22/2012   Procedure: RELEASE TRIGGER FINGER/A-1 PULLEY LEFT RING FINGER;  Surgeon: Wynonia Sours, MD;  Location: [facility];  Service: Orthopedics;  Laterality: Left;  Left     OB History    No data available       Home Medications    Prior to Admission medications   Medication Sig Start Date End Date Taking? Authorizing Provider  acetaminophen (TYLENOL) 500 MG tablet Take 500 mg by mouth every 6 (six) hours as needed for pain.    [provider]  acyclovir (ZOVIRAX) 400 MG tablet Take 400-800 mg by mouth 2 (two) times daily. 800mg  in the morning, 400mg  in the evening 03/30/14   [provider]  ALPRAZolam Duanne Moron) 0.5 MG tablet Take 0.25 mg by mouth  at bedtime as needed for sleep.    [provider]  aspirin EC 81 MG tablet Take 81 mg by mouth daily.    [provider]  benzonatate (TESSALON) 100 MG capsule Take 1 capsule (100 mg total) by mouth 3 (three) times daily as needed for cough. 08/08/17   Duffy Bruce, MD  cephALEXin (KEFLEX) 500 MG capsule Take 1 capsule (500 mg total) by mouth 2 (two) times daily for 7 days. 08/03/17 08/10/17  Lorin Glass, PA-C  cholecalciferol (VITAMIN D) 1000 UNITS tablet Take 2,000 Units by mouth daily.    [provider]  docusate sodium (COLACE) 100 MG capsule Take 1 capsule by mouth daily as needed for mild constipation.  06/15/13   [provider]  doxycycline (VIBRAMYCIN) 100 MG capsule Take 1 capsule (100 mg total) by mouth 2 (two) times daily for 7  days. 08/08/17 08/15/17  Duffy Bruce, MD  ferrous sulfate 325 (65 FE) MG tablet Take 325 mg by mouth daily.    [provider]  HYDROcodone-acetaminophen (NORCO/VICODIN) 5-325 MG tablet Take 1-2 tablets by mouth every 4 (four) hours as needed (mild pain). 05/07/15   Jovita Gamma, MD  loratadine (CLARITIN) 10 MG tablet Take 10 mg by mouth daily as needed for allergies.    [provider]  magnesium hydroxide (MILK OF MAGNESIA) 400 MG/5ML suspension Take 5 mLs by mouth at bedtime as needed for mild constipation.    [provider]  Memantine HCl-Donepezil HCl (NAMZARIC) 7-10 MG CP24 Take 1 tablet by mouth daily.    [provider]  metoprolol (LOPRESSOR) 50 MG tablet Take 25-50 mg by mouth 2 (two) times daily. Take 50 MG in the morning and take 25 MG at bedtime.    [provider]  omeprazole (PRILOSEC) 40 MG capsule Take 40 mg by mouth 2 (two) times daily.     [provider]  rosuvastatin (CRESTOR) 10 MG tablet Take 10 mg by mouth daily.    [provider]  venlafaxine XR (EFFEXOR-XR) 150 MG 24 hr capsule Take 150 mg by mouth daily with breakfast.     [provider]    Family History Family History  Problem Relation Age of Onset  . Heart attack Mother   . Parkinsonism Father   . Diabetes Brother     Social History Social History   Tobacco Use  . Smoking status: Former Research scientist (life sciences)  . Smokeless tobacco: Never Used  . Tobacco comment: quit smoking 20-30 yr. ago  Substance Use Topics  . Alcohol use: No  . Drug use: Yes     Allergies   Morphine and related; Scopace [scopolamine]; Nsaids; and Pravachol [pravastatin sodium]   Review of Systems Review of Systems  Musculoskeletal: Positive for arthralgias.  Neurological: Positive for weakness.  All other systems reviewed and are negative.    Physical Exam Updated Vital Signs BP 126/78   Pulse 95   Resp (!) 29   Ht 5\' 3"  (1.6 m)   Wt 55.8 kg (123 lb)   SpO2 96%   BMI 21.79 kg/m   Physical Exam  Constitutional: She is oriented to person, place, and time. She appears well-developed and well-nourished. No distress.  HENT:  Head: Normocephalic and atraumatic.  Eyes: Conjunctivae are normal.  Neck: Neck supple.  Cardiovascular: Normal rate, regular rhythm and normal heart sounds. Exam reveals no friction rub.  No murmur heard. Pulmonary/Chest: Effort normal and breath sounds normal. No respiratory distress. She has no wheezes. She has no rales.  Abdominal: She exhibits no distension.  Musculoskeletal: She exhibits no edema.  Chronic scoliosis.  Mild tenderness to palpation over midline lower lumbar spine.  No bruising or deformity.  Neurological: She is alert and oriented to person, place, and time. No cranial nerve deficit. She exhibits normal muscle tone.  Strength 5 out of 5 bilateral lower extremities.  Normal sensation light touch bilateral lower extremities.  Skin: Skin is warm. Capillary refill takes less than 2 seconds.  Psychiatric: She has a normal mood and affect.  Nursing note and vitals reviewed.    ED Treatments / Results  Labs (all labs  ordered are listed, but only abnormal results are displayed) Labs Reviewed  BASIC METABOLIC PANEL - Abnormal; Notable for the following components:      Result Value   Sodium 130 (*)    Chloride 96 (*)  All other components within normal limits  URINALYSIS, ROUTINE W REFLEX MICROSCOPIC - Abnormal; Notable for the following components:   APPearance HAZY (*)    Leukocytes, UA TRACE (*)    Bacteria, UA FEW (*)    Squamous Epithelial / LPF 0-5 (*)    All other components within normal limits  URINE CULTURE  CBC WITH DIFFERENTIAL/PLATELET  I-STAT TROPONIN, ED    EKG  EKG Interpretation  Date/Time:  Friday August 08 2017 13:00:37 EST Ventricular Rate:  104 PR Interval:  132 QRS Duration: 96 QT Interval:  344 QTC Calculation: 453 R Axis:   78 Text Interpretation:  Sinus tachycardia Probable left atrial enlargement No significant change since last tracing Confirmed by Duffy Bruce [phone]) on 08/08/2017 1:28:08 PM       Radiology Dg Chest 2 View  Result Date: 08/08/2017 CLINICAL DATA:  Cough and congestion EXAM: CHEST  2 VIEW COMPARISON:  08/03/2017 FINDINGS: Cardiac shadow is stable. Elevation of the right hemidiaphragm is noted. Minimal left basilar scarring is noted. No new focal infiltrate is seen. Changes of prior vertebral augmentation at T11 and T12 are noted. Compression deformities at T8, T9 and T10 are seen as well as T4. IMPRESSION: No acute abnormality noted. Multiple thoracic compression deformities are noted. Electronically Signed   By: Inez Catalina M.D.   On: 08/08/2017 12:12   Dg Lumbar Spine Complete  Result Date: 08/08/2017 CLINICAL DATA:  Recent fall with back pain, initial encounter EXAM: LUMBAR SPINE - COMPLETE 4+ VIEW COMPARISON:  08/03/2017 FINDINGS: Lower thoracic compression deformities are again noted with changes of prior augmentation at T11 and T12. L4 compression deformity is seen similar to that noted on prior exam as well as prior MRI examination.  No new compression deformity is seen. Anterolisthesis of L4 on L5 is noted stable from the prior exam. Facet hypertrophic changes are seen. Prior left hip replacement is noted. Aortic calcifications are seen without aneurysmal dilatation. IMPRESSION: Stable thoracic and lumbar compression deformities. No acute abnormality is noted. Electronically Signed   By: Inez Catalina M.D.   On: 08/08/2017 12:14   Ct Angio Chest Pe W Or Wo Contrast  Result Date: 08/08/2017 CLINICAL DATA:  PE suspected. High pretest probability. [place] out of chair earlier today. EXAM: CT ANGIOGRAPHY CHEST WITH CONTRAST TECHNIQUE: Multidetector CT imaging of the chest was performed using the standard protocol during bolus administration of intravenous contrast. Multiplanar CT image reconstructions and MIPs were obtained to evaluate the vascular anatomy. CONTRAST:  <See Chart> ISOVUE-370 IOPAMIDOL (ISOVUE-370) INJECTION 76% COMPARISON:  None. FINDINGS: Cardiovascular: Normal heart size. Aortic atherosclerosis identified. Calcifications within the LAD and left circumflex coronary arteries identified. No pericardial effusion. The main pulmonary artery is patent. No saddle embolus. No lobar or segmental pulmonary artery filling defects noted. Mediastinum/Nodes: Normal appearance of the thyroid gland. The trachea appears patent and is midline. Normal appearance of the esophagus. No enlarged mediastinal or hilar lymph nodes. Lungs/Pleura: No pleural effusion. Mild thickening identified within both lung bases. Subsegmental atelectasis noted in both lower lobes, left greater than right. No airspace consolidation. Upper Abdomen: No acute abnormality. Musculoskeletal: Thoracic kyphosis is identified. Multiple compression deformities are noted throughout the thoracic spine. Review of the MIP images confirms the above findings. IMPRESSION: 1. No evidence for acute pulmonary embolus. 2. Aortic atherosclerosis and 2 vessel coronary artery calcifications.  Aortic Atherosclerosis (ICD10-I70.0). 3. Thoracic kyphosis with multi level thoracic compression deformities. Electronically Signed   By: Kerby Moors M.D.   On: 08/08/2017 14:20  Procedures Procedures (including critical care time)  Medications Ordered in ED Medications  LORazepam (ATIVAN) injection 0.25 mg (0.25 mg Intravenous Given 08/08/17 1318)  iopamidol (ISOVUE-370) 76 % injection (60 mLs Intravenous Contrast Given 08/08/17 1334)  sodium chloride 0.9 % bolus 500 mL (0 mLs Intravenous Stopped 08/08/17 1513)  doxycycline (VIBRA-TABS) tablet 100 mg (100 mg Oral Given 08/08/17 1458)  ipratropium-albuterol (DUONEB) 0.5-2.5 (3) MG/3ML nebulizer solution 3 mL (3 mLs Nebulization Given 08/08/17 1458)  dexamethasone (DECADRON) tablet 10 mg (10 mg Oral Given 08/08/17 1519)  albuterol (PROVENTIL HFA;VENTOLIN HFA) 108 (90 Base) MCG/ACT inhaler 2 puff (2 puffs Inhalation Given 08/08/17 1519)  optichamber diamond 1 each (1 each Other Given 08/08/17 1519)     Initial Impression / Assessment and Plan / ED Course  I have reviewed the triage vital signs and the nursing notes.  Pertinent labs & imaging results that were available during my care of the patient were reviewed by me and considered in my medical decision making (see chart for details).     84 year old female with past medical history as above here with mild transient weakness and shortness of breath.  Patient sent from her PCP office for evaluation of possible pneumonia.  Patient has had a mild cough.  On arrival, the patient is remarkably well-appearing and in no distress.  She is satting well on room air.  She has some intermittent tachypnea during which she begins hyperventilating.  She reports anxiety with these episodes.  On reassurance, her tachypnea resolves.  Broad lab work sent.  Patient has normal white count, normal hemoglobin, and no signs of infection.  Chest x-ray is clear.  Urinalysis is without evidence of UTI.  Troponin is  negative and EKG is nonischemic.  Given her persistent tachypnea and tachycardia intermittently, CT angios obtained and is negative.  She has no evidence of PE or pneumonia.  Patient feels improved after empiric treatment with albuterol for possible bronchitis as she did note some wheezing on exam.  She has multiple sick contacts at her facility.  I suspect she may have mild bronchitis and will treat as such.  Empiric antibiotics given.  I have also given her a dose of Decadron as she responded well to the beta agonist.  She is ambulatory throughout the ED without difficulty and is satting very well on room air.  She is able to eat and drink without difficulty.  Discussed management options and the patient would very much like to return to her facility, which I feel is reasonable given reassuring labs and vital signs.  I have also discussed with her PCP, Dr. Inda Merlin, who is in agreement.  Final Clinical Impressions(s) / ED Diagnoses   Final diagnoses:  Bronchitis    ED Discharge Orders        Ordered    doxycycline (VIBRAMYCIN) 100 MG capsule  2 times daily     08/08/17 1513    benzonatate (TESSALON) 100 MG capsule  3 times daily PRN     08/08/17 1513       Duffy Bruce, MD 08/08/17 1535

## 2017-08-08 NOTE — ED Notes (Signed)
Patient transported to X-ray 

## 2017-08-08 NOTE — ED Notes (Signed)
Patient returned from X-ray 

## 2017-08-08 NOTE — ED Triage Notes (Signed)
Patient walked yesterday with walker as normal and today felt excessive weakness upon waking. Went to PCP and PCP called EMS.

## 2017-08-08 NOTE — Discharge Instructions (Signed)
For your bronchitis:  - Start taking Doxycycline 100 mg twice a day for 7 days - Use the ALBUTEROL inhaler every 4-6 hours for 24 hours, then every 4-6 hours as needed  Follow-up with Dr. Inda Merlin on Monday  Make sure you eat and drink plenty of fluids

## 2017-08-08 NOTE — ED Notes (Signed)
Called [facility] for pt transport back to [facility] per RN Rod Holler

## 2017-08-10 LAB — URINE CULTURE: SPECIAL REQUESTS: NORMAL

## 2017-08-11 ENCOUNTER — Telehealth: Payer: Self-pay | Admitting: Emergency Medicine

## 2017-08-11 DIAGNOSIS — R5383 Other fatigue: Secondary | ICD-10-CM | POA: Diagnosis not present

## 2017-08-11 DIAGNOSIS — M545 Low back pain: Secondary | ICD-10-CM | POA: Diagnosis not present

## 2017-08-11 DIAGNOSIS — R2681 Unsteadiness on feet: Secondary | ICD-10-CM | POA: Diagnosis not present

## 2017-08-11 DIAGNOSIS — R296 Repeated falls: Secondary | ICD-10-CM | POA: Diagnosis not present

## 2017-08-11 DIAGNOSIS — F419 Anxiety disorder, unspecified: Secondary | ICD-10-CM | POA: Diagnosis not present

## 2017-08-11 DIAGNOSIS — M6281 Muscle weakness (generalized): Secondary | ICD-10-CM | POA: Diagnosis not present

## 2017-08-11 NOTE — Telephone Encounter (Signed)
Post ED Visit - Positive Culture Follow-up  Culture report reviewed by antimicrobial stewardship pharmacist:  []  Elenor Quinones, Pharm.D. []  Heide Guile, Pharm.D., BCPS AQ-ID []  Parks Neptune, Pharm.D., BCPS [x]  Alycia Rossetti, Pharm.D., BCPS []  [place], [place].D., BCPS, AAHIVP []  Legrand Como, Pharm.D., BCPS, AAHIVP []  Salome Arnt, PharmD, BCPS []  Jalene Mullet, PharmD []  Vincenza Hews, PharmD, BCPS  Positive urine culture Treated with doxycycline, organism sensitive to the same and no further patient follow-up is required at this time.  Hazle Nordmann 08/11/2017, 11:49 AM

## 2017-08-12 DIAGNOSIS — R2681 Unsteadiness on feet: Secondary | ICD-10-CM | POA: Diagnosis not present

## 2017-08-12 DIAGNOSIS — R5383 Other fatigue: Secondary | ICD-10-CM | POA: Diagnosis not present

## 2017-08-12 DIAGNOSIS — M545 Low back pain: Secondary | ICD-10-CM | POA: Diagnosis not present

## 2017-08-12 DIAGNOSIS — R296 Repeated falls: Secondary | ICD-10-CM | POA: Diagnosis not present

## 2017-08-12 DIAGNOSIS — M6281 Muscle weakness (generalized): Secondary | ICD-10-CM | POA: Diagnosis not present

## 2017-08-12 DIAGNOSIS — F419 Anxiety disorder, unspecified: Secondary | ICD-10-CM | POA: Diagnosis not present

## 2017-08-13 DIAGNOSIS — R296 Repeated falls: Secondary | ICD-10-CM | POA: Diagnosis not present

## 2017-08-13 DIAGNOSIS — M6281 Muscle weakness (generalized): Secondary | ICD-10-CM | POA: Diagnosis not present

## 2017-08-13 DIAGNOSIS — M545 Low back pain: Secondary | ICD-10-CM | POA: Diagnosis not present

## 2017-08-13 DIAGNOSIS — R2681 Unsteadiness on feet: Secondary | ICD-10-CM | POA: Diagnosis not present

## 2017-08-13 DIAGNOSIS — R5383 Other fatigue: Secondary | ICD-10-CM | POA: Diagnosis not present

## 2017-08-13 DIAGNOSIS — F419 Anxiety disorder, unspecified: Secondary | ICD-10-CM | POA: Diagnosis not present

## 2017-08-17 ENCOUNTER — Encounter (HOSPITAL_COMMUNITY): Payer: Self-pay | Admitting: *Deleted

## 2017-08-17 ENCOUNTER — Emergency Department (HOSPITAL_COMMUNITY): Payer: Medicare Other

## 2017-08-17 ENCOUNTER — Emergency Department (HOSPITAL_COMMUNITY)
Admission: EM | Admit: 2017-08-17 | Discharge: 2017-08-17 | Disposition: A | Discharge status: Home / Self Care | Payer: Medicare Other | Attending: Emergency Medicine | Admitting: Emergency Medicine

## 2017-08-17 DIAGNOSIS — Z7982 Long term (current) use of aspirin: Secondary | ICD-10-CM | POA: Insufficient documentation

## 2017-08-17 DIAGNOSIS — S52022A Displaced fracture of olecranon process without intraarticular extension of left ulna, initial encounter for closed fracture: Secondary | ICD-10-CM | POA: Diagnosis not present

## 2017-08-17 DIAGNOSIS — I10 Essential (primary) hypertension: Secondary | ICD-10-CM | POA: Insufficient documentation

## 2017-08-17 DIAGNOSIS — F0390 Unspecified dementia without behavioral disturbance: Secondary | ICD-10-CM | POA: Insufficient documentation

## 2017-08-17 DIAGNOSIS — Z87891 Personal history of nicotine dependence: Secondary | ICD-10-CM | POA: Insufficient documentation

## 2017-08-17 DIAGNOSIS — Z79899 Other long term (current) drug therapy: Secondary | ICD-10-CM | POA: Diagnosis not present

## 2017-08-17 DIAGNOSIS — Y939 Activity, unspecified: Secondary | ICD-10-CM | POA: Insufficient documentation

## 2017-08-17 DIAGNOSIS — Y92121 Bathroom in nursing home as the place of occurrence of the external cause: Secondary | ICD-10-CM | POA: Insufficient documentation

## 2017-08-17 DIAGNOSIS — R52 Pain, unspecified: Secondary | ICD-10-CM

## 2017-08-17 DIAGNOSIS — M25551 Pain in right hip: Secondary | ICD-10-CM | POA: Diagnosis not present

## 2017-08-17 DIAGNOSIS — Y998 Other external cause status: Secondary | ICD-10-CM | POA: Diagnosis not present

## 2017-08-17 DIAGNOSIS — W19XXXA Unspecified fall, initial encounter: Secondary | ICD-10-CM

## 2017-08-17 DIAGNOSIS — M25552 Pain in left hip: Secondary | ICD-10-CM | POA: Insufficient documentation

## 2017-08-17 DIAGNOSIS — S59902A Unspecified injury of left elbow, initial encounter: Secondary | ICD-10-CM | POA: Diagnosis present

## 2017-08-17 DIAGNOSIS — R51 Headache: Secondary | ICD-10-CM | POA: Diagnosis not present

## 2017-08-17 DIAGNOSIS — S42309A Unspecified fracture of shaft of humerus, unspecified arm, initial encounter for closed fracture: Secondary | ICD-10-CM | POA: Diagnosis not present

## 2017-08-17 DIAGNOSIS — S79911A Unspecified injury of right hip, initial encounter: Secondary | ICD-10-CM | POA: Diagnosis not present

## 2017-08-17 DIAGNOSIS — S0990XA Unspecified injury of head, initial encounter: Secondary | ICD-10-CM | POA: Diagnosis not present

## 2017-08-17 DIAGNOSIS — G8911 Acute pain due to trauma: Secondary | ICD-10-CM | POA: Diagnosis not present

## 2017-08-17 DIAGNOSIS — S79912A Unspecified injury of left hip, initial encounter: Secondary | ICD-10-CM | POA: Diagnosis not present

## 2017-08-17 DIAGNOSIS — S5002XA Contusion of left elbow, initial encounter: Secondary | ICD-10-CM | POA: Diagnosis not present

## 2017-08-17 DIAGNOSIS — T148XXA Other injury of unspecified body region, initial encounter: Secondary | ICD-10-CM | POA: Diagnosis not present

## 2017-08-17 LAB — URINALYSIS, ROUTINE W REFLEX MICROSCOPIC
BILIRUBIN URINE: NEGATIVE
GLUCOSE, UA: NEGATIVE mg/dL
Hgb urine dipstick: NEGATIVE
Ketones, ur: NEGATIVE mg/dL
Leukocytes, UA: NEGATIVE
NITRITE: NEGATIVE
Protein, ur: NEGATIVE mg/dL
SPECIFIC GRAVITY, URINE: 1.009 (ref 1.005–1.030)
pH: 9 — ABNORMAL HIGH (ref 5.0–8.0)

## 2017-08-17 MED ORDER — STERILE WATER FOR INJECTION IJ SOLN
INTRAMUSCULAR | Status: AC
  Filled 2017-08-17: qty 10

## 2017-08-17 MED ORDER — ZIPRASIDONE MESYLATE 20 MG IM SOLR
5.0000 mg | Freq: Once | INTRAMUSCULAR | Status: DC
  Filled 2017-08-17: qty 20

## 2017-08-17 MED ORDER — ACETAMINOPHEN 500 MG PO TABS
1000.0000 mg | ORAL_TABLET | Freq: Once | ORAL | Status: AC
Start: 2017-08-17 — End: 2017-08-17
  Administered 2017-08-17: 1000 mg via ORAL
  Filled 2017-08-17: qty 2

## 2017-08-17 NOTE — ED Notes (Signed)
Pt has pulled off wrap and removed elbow sling.

## 2017-08-17 NOTE — Discharge Instructions (Addendum)
You have fractured the part of your elbow call the olecranon.  Splint, Tylenol for pain, follow-up with orthopedic surgeon.  Phone number given.  Urinalysis was normal.

## 2017-08-17 NOTE — ED Notes (Signed)
[facility] is loading pt up and transferring pt now.

## 2017-08-17 NOTE — ED Notes (Signed)
Patient transported to CT 

## 2017-08-17 NOTE — ED Notes (Signed)
Bed: WA20 Expected date:  Expected time:  Means of arrival:  Comments: 84 yo F/Fall Poss elbow deformity/hip pain

## 2017-08-17 NOTE — ED Notes (Signed)
[facility] has been called and notified.

## 2017-08-17 NOTE — ED Notes (Signed)
Ortho has been contacted and is in route to re-apply splint.

## 2017-08-17 NOTE — ED Notes (Signed)
Pt has taken off sling and ace wrap.

## 2017-08-17 NOTE — ED Triage Notes (Signed)
Pt is from [facility], pt fell in the bathroom. She was in a standing position and her legs gave out. She fell on her left side. Swelling to the left elbow and c/o left hip pain. Pt has alz.  And is at baseline mental status. IV established and 50 Fentanyl given en route. 157/81, hr 83, 96% on RA, resp 18. NSR on the monitor.

## 2017-08-17 NOTE — ED Provider Notes (Addendum)
[facility] DEPT Provider Note   CSN: [id number] Arrival date & time: 08/17/17  9381     History   Chief Complaint Chief Complaint  Patient presents with  . Fall    HPI Kristin Solomon is a 84 y.o. female.  Level 5 caveat for mild dementia.  Patient allegedly accidentally fell in the bathroom earlier today at [facility].  She complains of left elbow and left hip pain.  Son reports a history of mild dementia.  He is currently been treated for a urinary tract infection.  No obvious head or neck trauma.      Past Medical History:  Diagnosis Date  . Anemia, iron deficiency    takes Ferrous Sulfate daily  . Depression    takes Effexor daily  . Gait disorder 04/21/2014  . GERD (gastroesophageal reflux disease)    takes Omeprazole daily  . Herpes ocular    history of-takes Acyclovir daily  . High cholesterol    takes Atorvastatin daily  . History of hiatal hernia   . Hypertension    takes Metoprolol daily  . Insomnia    takes Xanax nightly  . Osteoarthritis of knee    bilateral  . Sciatic pain     Patient Active Problem List   Diagnosis Date Noted  . T12 compression fracture ([place]) 05/06/2015  . Wedge compression fracture of T11 vertebra ([place]) 04/10/2015  . Left hip pain 07/13/2014  . GERD (gastroesophageal reflux disease) 07/13/2014  . Dry mouth 07/13/2014  . 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 Gait disorder 04/21/2014  . Constipation 06/21/2013  . Depression with anxiety 06/21/2013  . Diverticulosis of colon without hemorrhage 06/13/2013  . S/P Nissen fundoplication (without gastrostomy tube) procedure 06/13/2013  . Lactose intolerance 06/13/2013  . PVCs (premature ventricular contractions) 06/13/2013  . Fracture of femoral neck, left ([place]) 06/12/2013  . Hip fracture requiring operative repair ([place]) 06/12/2013  . Closed left hip fracture ([place]) 06/12/2013  . Leukocytosis, unspecified 06/12/2013  . Postoperative  anemia due to acute blood loss 12/25/2011  . Lumbar radiculopathy, chronic 12/23/2011  . Hyperlipidemia   . Hypertension   . Tachycardia   . Herpes ocular   . Anemia, iron deficiency   . Right knee DJD   . Status post total knee replacement     Past Surgical History:  Procedure Laterality Date  . ABDOMINAL HYSTERECTOMY  1995   partial  . CARPAL TUNNEL RELEASE Right 07/21/2007  . CARPAL TUNNEL RELEASE Left 09/24/2007  . COLONOSCOPY    . DESCEMETS STRIPPING AUTOMATED ENDOTHELIAL KERATOPLASTY Left 03/14/2011  . DESCEMETS STRIPPING AUTOMATED ENDOTHELIAL KERATOPLASTY Right 08/20/2012  . EYE SURGERY Bilateral    cataract surgery  . EYE SURGERY Left    corneal transplant  . HERNIA REPAIR    . HIP ARTHROPLASTY Left 06/13/2013   Procedure: ARTHROPLASTY BIPOLAR HIP; Injection left shoulder;  Surgeon: Johnny Bridge, MD;  Location: [facility];  Service: Orthopedics;  Laterality: Left;  . JOINT REPLACEMENT     bilateral knees, left knee  . KNEE ARTHROSCOPY Right 05/11/2001  . KYPHOPLASTY N/A 04/10/2015   Procedure: T11 Kyphoplasty;  Surgeon: Jovita Gamma, MD;  Location: [facility];  Service: Neurosurgery;  Laterality: N/A;  T11 Kyphoplasty  . KYPHOPLASTY N/A 05/06/2015   Procedure: KYPHOPLASTY Thoracic twelve;  Surgeon: Jovita Gamma, MD;  Location: [facility];  Service: Neurosurgery;  Laterality: N/A;  T12 Kyphoplasty  . LAPAROSCOPIC NISSEN FUNDOPLICATION  02/19/9370  . LUMBAR LAMINECTOMY/DECOMPRESSION MICRODISCECTOMY  08/29/2010   L2-S1  . SHOULDER ARTHROSCOPY WITH ROTATOR CUFF REPAIR AND SUBACROMIAL DECOMPRESSION Right 01/01/2000  . TONSILLECTOMY     as child  . TOTAL HIP REVISION Left 07/08/2014   Procedure: TOTAL HIP REVISION;  Surgeon: Kerin Salen, MD;  Location: [facility];  Service: Orthopedics;  Laterality: Left;  . TOTAL KNEE ARTHROPLASTY Left 05/14/2004  . TOTAL KNEE ARTHROPLASTY  12/23/2011   Procedure: TOTAL KNEE ARTHROPLASTY;  Surgeon: Lorn Junes, MD;  Location: [facility];   Service: Orthopedics;  Laterality: Right;  DR Stedman THIS CASE  . TRIGGER FINGER RELEASE Right 07/21/2007   thumb  . TRIGGER FINGER RELEASE Left 09/24/2007   middle finger  . TRIGGER FINGER RELEASE Right 03/10/2008   ring and little fingers  . TRIGGER FINGER RELEASE Right 09/02/2012   Procedure: RELEASE TRIGGER FINGER/A-1 PULLEY RIGHT INDEX FINGER;  Surgeon: Wynonia Sours, MD;  Location: [facility];  Service: Orthopedics;  Laterality: Right;  . TRIGGER FINGER RELEASE Left 12/22/2012   Procedure: RELEASE TRIGGER FINGER/A-1 PULLEY LEFT RING FINGER;  Surgeon: Wynonia Sours, MD;  Location: [facility];  Service: Orthopedics;  Laterality: Left;  Left     OB History    No data available       Home Medications    Prior to Admission medications   Medication Sig Start Date End Date Taking? Authorizing Provider  acetaminophen (TYLENOL) 500 MG tablet Take 500 mg by mouth every 6 (six) hours as needed for pain.   Yes [provider]  albuterol (PROVENTIL) (2.5 MG/3ML) 0.083% nebulizer solution Take 2.5 mg by nebulization every 4 (four) hours as needed for wheezing or shortness of breath.   Yes [provider]  aspirin EC 81 MG tablet Take 81 mg by mouth daily.   Yes [provider]  cephALEXin (KEFLEX) 500 MG capsule Take 500 mg by mouth 2 (two) times daily.   Yes [provider]  cholecalciferol (VITAMIN D) 1000 UNITS tablet Take 2,000 Units by mouth daily.   Yes [provider]  Dextran 70-Hypromellose, PF, 0.1-0.3 % SOLN Place 1 drop into both eyes 4 (four) times daily.   Yes [provider]  Dextromethorphan-guaiFENesin (ROBITUSSIN COUGH+CHEST CONG DM) 5-100 MG/5ML LIQD Take 10 mLs by mouth every 6 (six) hours as needed (cough).   Yes [provider]  ferrous sulfate 325 (65 FE) MG tablet Take 325 mg by mouth 2 (two) times daily. One tablet daily during the week, one tablet twice daily only  on Saturday and Sunday   Yes [provider]  fluticasone furoate-vilanterol (BREO ELLIPTA) 100-25 MCG/INH AEPB Inhale 1 puff into the lungs daily.   Yes [provider]  ibuprofen (ADVIL,MOTRIN) 600 MG tablet Take 600 mg by mouth every 8 (eight) hours as needed.   Yes [provider]  loperamide (IMODIUM A-D) 2 MG tablet Take 2 mg by mouth as directed.   Yes [provider]  LORazepam (ATIVAN) 1 MG tablet Take 1 mg by mouth 2 (two) times daily.   Yes [provider]  magnesium hydroxide (MILK OF MAGNESIA) 400 MG/5ML suspension Take 15 mLs by mouth at bedtime as needed for mild constipation.    Yes [provider]  metoprolol (LOPRESSOR) 50 MG tablet Take 25-50 mg by mouth 2 (two) times daily. Take 50 MG in the morning and take 25 MG at bedtime.   Yes [provider]  nitroGLYCERIN (NITROSTAT) 0.4 MG SL  tablet Place 0.4 mg under the tongue every 5 (five) minutes as needed for chest pain.   Yes [provider]  omeprazole (PRILOSEC) 20 MG capsule Take 20 mg by mouth daily.    Yes [provider]  saccharomyces boulardii (FLORASTOR) 250 MG capsule Take 250 mg by mouth daily.   Yes [provider]  venlafaxine XR (EFFEXOR-XR) 150 MG 24 hr capsule Take 150 mg by mouth daily with breakfast.   Yes [provider]  benzonatate (TESSALON) 100 MG capsule Take 1 capsule (100 mg total) by mouth 3 (three) times daily as needed for cough. 08/08/17   Duffy Bruce, MD  docusate sodium (COLACE) 100 MG capsule Take 1 capsule by mouth daily as needed for mild constipation.  06/15/13   [provider]  HYDROcodone-acetaminophen (NORCO/VICODIN) 5-325 MG tablet Take 1-2 tablets by mouth every 4 (four) hours as needed (mild pain). 05/07/15   Jovita Gamma, MD  loratadine (CLARITIN) 10 MG tablet Take 10 mg by mouth daily as needed for allergies.    [provider]  Memantine HCl-Donepezil HCl (NAMZARIC)  7-10 MG CP24 Take 1 tablet by mouth daily.    [provider]    Family History Family History  Problem Relation Age of Onset  . Heart attack Mother   . Parkinsonism Father   . Diabetes Brother     Social History Social History   Tobacco Use  . Smoking status: Former Research scientist (life sciences)  . Smokeless tobacco: Never Used  . Tobacco comment: quit smoking 20-30 yr. ago  Substance Use Topics  . Alcohol use: No  . Drug use: Yes     Allergies   Morphine and related; Scopolamine; Atorvastatin; Morphine; Nsaids; Pravachol [pravastatin sodium]; Pravastatin; and Teriparatide   Review of Systems Review of Systems  Unable to perform ROS: Dementia     Physical Exam Updated Vital Signs BP 126/66   Pulse (!) 53   Temp (!) 97.5 F (36.4 C) (Oral)   Resp 20   SpO2 93%   Physical Exam  Constitutional: She is oriented to person, place, and time.  No acute distress.  HENT:  Head: Normocephalic and atraumatic.  Eyes: Conjunctivae are normal.  Neck: Neck supple.  Cardiovascular: Normal rate and regular rhythm.  Pulmonary/Chest: Effort normal and breath sounds normal.  Abdominal: Soft. Bowel sounds are normal.  Musculoskeletal:  Minimal tenderness bilateral lateral hip area; tender at tip of left elbow; pain with range of motion.  Neurological: She is alert and oriented to person, place, and time.  Skin: Skin is warm and dry.  Psychiatric: She has a normal mood and affect. Her behavior is normal.  Nursing note and vitals reviewed.    ED Treatments / Results  Labs (all labs ordered are listed, but only abnormal results are displayed) Labs Reviewed - No data to display  EKG  EKG Interpretation  Date/Time:  Sunday August 17 2017 06:59:43 EST Ventricular Rate:  81 PR Interval:    QRS Duration: 83 QT Interval:  387 QTC Calculation: 450 R Axis:   56 Text Interpretation:  Sinus rhythm Inferior infarct, age indeterminate Confirmed by Nat Christen [phone]) on 08/17/2017 7:28:24  AM       Radiology Dg Elbow Complete Left  Result Date: 08/17/2017 CLINICAL DATA:  Left elbow pain, swelling and bruising after falling in the bathroom. EXAM: LEFT ELBOW - COMPLETE 3+ VIEW COMPARISON:  None. FINDINGS: Fracture of the olecranon with retraction of the fracture fragment by 1.5 cm. No fracture of  the humerus or radius is seen. Obviously, there is a joint effusion. IMPRESSION: Retracted olecranon fracture. Electronically Signed   By: Nelson Chimes M.D.   On: 08/17/2017 09:00   Ct Head Wo Contrast  Result Date: 08/17/2017 CLINICAL DATA:  Fall.  Pain. EXAM: CT HEAD WITHOUT CONTRAST TECHNIQUE: Contiguous axial images were obtained from the base of the skull through the vertex without intravenous contrast. COMPARISON:  None. FINDINGS: Brain: No subdural, epidural, or subarachnoid hemorrhage. Cerebellum, brainstem, and basal cisterns are normal. Ventricles and sulci are unremarkable. White matter changes identified. No acute cortical ischemia infarct. No mass effect or midline shift. Vascular: No hyperdense vessel or unexpected calcification. Skull: Normal. Negative for fracture or focal lesion. Sinuses/Orbits: No acute finding. Other: None. IMPRESSION: No acute intracranial abnormalities.  Chronic white matter changes. Electronically Signed   By: Dorise Bullion III M.D   On: 08/17/2017 12:00   Dg Hips Bilat With Pelvis 3-4 Views  Result Date: 08/17/2017 CLINICAL DATA:  Left greater than right hip pain after fall this morning EXAM: DG HIP (WITH OR WITHOUT PELVIS) 3-4V BILAT COMPARISON:  08/03/2017 pelvic and hip radiographs FINDINGS: No pelvic fracture or diastasis. Left total hip arthroplasty, with no evidence of hardware fracture or loosening. No hip dislocation. No acute osseous fracture in the hips. No suspicious focal osseous lesions. IMPRESSION: No acute osseous fracture or malalignment. Left total hip arthroplasty, with no evidence of hardware complication. Electronically Signed   By:  Ilona Sorrel M.D.   On: 08/17/2017 09:00    Procedures Procedures (including critical care time)  Medications Ordered in ED Medications - No data to display   Initial Impression / Assessment and Plan / ED Course  I have reviewed the triage vital signs and the nursing notes.  Pertinent labs & imaging results that were available during my care of the patient were reviewed by me and considered in my medical decision making (see chart for details).     Status post accidental fall today.  CT head and bilateral hip films were negative.  Plain films of left elbow shows a retracted olecranon fracture.  This was discussed with the orthopedic doctor on call.  Will apply a posterior splint, referral to orthopedics.  Urinalysis repeated and is normal.    I discussed the clinical scenario with the son.  He was requesting a narcotic pain pill.  I did not think this was a wise decision secondary to her age and dementia.  Final Clinical Impressions(s) / ED Diagnoses   Final diagnoses:  Fall, initial encounter  Closed fracture of olecranon process of left ulna, initial encounter    ED Discharge Orders    None       Nat Christen, MD 08/17/17 1415    Nat Christen, MD 08/17/17 1443    Nat Christen, MD 08/20/17 [id number]

## 2017-08-17 NOTE — ED Notes (Signed)
Patient transported to X-ray 

## 2017-08-18 DIAGNOSIS — R05 Cough: Secondary | ICD-10-CM | POA: Diagnosis not present

## 2017-08-18 DIAGNOSIS — R000 Tachycardia, unspecified: Secondary | ICD-10-CM | POA: Diagnosis not present

## 2017-08-18 DIAGNOSIS — R531 Weakness: Secondary | ICD-10-CM | POA: Diagnosis not present

## 2017-08-18 DIAGNOSIS — S32301D Unspecified fracture of right ilium, subsequent encounter for fracture with routine healing: Secondary | ICD-10-CM | POA: Diagnosis not present

## 2017-08-18 DIAGNOSIS — R1313 Dysphagia, pharyngeal phase: Secondary | ICD-10-CM | POA: Diagnosis not present

## 2017-08-18 DIAGNOSIS — R296 Repeated falls: Secondary | ICD-10-CM | POA: Diagnosis not present

## 2017-08-18 DIAGNOSIS — R278 Other lack of coordination: Secondary | ICD-10-CM | POA: Diagnosis not present

## 2017-08-18 DIAGNOSIS — Z9181 History of falling: Secondary | ICD-10-CM | POA: Diagnosis not present

## 2017-08-18 DIAGNOSIS — Z7389 Other problems related to life management difficulty: Secondary | ICD-10-CM | POA: Diagnosis not present

## 2017-08-18 DIAGNOSIS — M6281 Muscle weakness (generalized): Secondary | ICD-10-CM | POA: Diagnosis not present

## 2017-08-18 DIAGNOSIS — W19XXXA Unspecified fall, initial encounter: Secondary | ICD-10-CM | POA: Diagnosis not present

## 2017-08-18 DIAGNOSIS — N390 Urinary tract infection, site not specified: Secondary | ICD-10-CM | POA: Diagnosis not present

## 2017-08-18 DIAGNOSIS — R2681 Unsteadiness on feet: Secondary | ICD-10-CM | POA: Diagnosis not present

## 2017-08-18 DIAGNOSIS — Z96642 Presence of left artificial hip joint: Secondary | ICD-10-CM | POA: Diagnosis not present

## 2017-08-18 DIAGNOSIS — R062 Wheezing: Secondary | ICD-10-CM | POA: Diagnosis not present

## 2017-08-18 DIAGNOSIS — R41841 Cognitive communication deficit: Secondary | ICD-10-CM | POA: Diagnosis not present

## 2017-08-18 DIAGNOSIS — T7840XA Allergy, unspecified, initial encounter: Secondary | ICD-10-CM | POA: Diagnosis not present

## 2017-08-18 DIAGNOSIS — Z09 Encounter for follow-up examination after completed treatment for conditions other than malignant neoplasm: Secondary | ICD-10-CM | POA: Diagnosis not present

## 2017-08-18 DIAGNOSIS — K5900 Constipation, unspecified: Secondary | ICD-10-CM | POA: Diagnosis not present

## 2017-08-18 DIAGNOSIS — R079 Chest pain, unspecified: Secondary | ICD-10-CM | POA: Diagnosis not present

## 2017-08-18 DIAGNOSIS — B0239 Other herpes zoster eye disease: Secondary | ICD-10-CM | POA: Diagnosis not present

## 2017-08-19 ENCOUNTER — Encounter: Payer: Self-pay | Admitting: Internal Medicine

## 2017-08-19 ENCOUNTER — Non-Acute Institutional Stay (SKILLED_NURSING_FACILITY): Payer: Medicare Other | Admitting: Internal Medicine

## 2017-08-19 DIAGNOSIS — I10 Essential (primary) hypertension: Secondary | ICD-10-CM | POA: Diagnosis not present

## 2017-08-19 DIAGNOSIS — R278 Other lack of coordination: Secondary | ICD-10-CM | POA: Diagnosis not present

## 2017-08-19 DIAGNOSIS — K219 Gastro-esophageal reflux disease without esophagitis: Secondary | ICD-10-CM

## 2017-08-19 DIAGNOSIS — R4189 Other symptoms and signs involving cognitive functions and awareness: Secondary | ICD-10-CM

## 2017-08-19 DIAGNOSIS — Z7389 Other problems related to life management difficulty: Secondary | ICD-10-CM | POA: Diagnosis not present

## 2017-08-19 DIAGNOSIS — F418 Other specified anxiety disorders: Secondary | ICD-10-CM

## 2017-08-19 DIAGNOSIS — S52022D Displaced fracture of olecranon process without intraarticular extension of left ulna, subsequent encounter for closed fracture with routine healing: Secondary | ICD-10-CM | POA: Diagnosis not present

## 2017-08-19 DIAGNOSIS — D509 Iron deficiency anemia, unspecified: Secondary | ICD-10-CM | POA: Diagnosis not present

## 2017-08-19 DIAGNOSIS — R2681 Unsteadiness on feet: Secondary | ICD-10-CM | POA: Diagnosis not present

## 2017-08-19 DIAGNOSIS — B965 Pseudomonas (aeruginosa) (mallei) (pseudomallei) as the cause of diseases classified elsewhere: Secondary | ICD-10-CM | POA: Diagnosis not present

## 2017-08-19 DIAGNOSIS — R296 Repeated falls: Secondary | ICD-10-CM | POA: Diagnosis not present

## 2017-08-19 DIAGNOSIS — N3080 Other cystitis without hematuria: Secondary | ICD-10-CM

## 2017-08-19 DIAGNOSIS — M6281 Muscle weakness (generalized): Secondary | ICD-10-CM | POA: Diagnosis not present

## 2017-08-19 DIAGNOSIS — R531 Weakness: Secondary | ICD-10-CM | POA: Diagnosis not present

## 2017-08-19 DIAGNOSIS — E871 Hypo-osmolality and hyponatremia: Secondary | ICD-10-CM

## 2017-08-19 NOTE — Progress Notes (Signed)
Provider:  Blanchie Serve MD  Location:  [facility] Room Number: 42-B Place of Service:  SNF (31)  PCP: Josetta Huddle, MD Patient Care Team: Josetta Huddle, MD as PCP - General (Internal Medicine) [name], MD as Referring Physician (Ophthalmology)  Extended Emergency Contact Information Primary Emergency Contact: [name] Address: [address]          [place], [place] [address] [name] of [place] Phone: [phone] Mobile Phone: [phone] Relation: Son Secondary Emergency Contact: [name] Address: [address]          [place], [place] [address] [name] of [place] Phone: [phone] Mobile Phone: [phone] Relation: Son  Code Status: full code  Goals of Care: Advanced Directive information Advanced Directives 08/03/2017  Does Patient Have a Medical Advance Directive? No  Type of Advance Directive -  Does patient want to make changes to medical advance directive? -  Copy of [place] in Chart? -  Would patient like information on creating a medical advance directive? No - Patient declined  Pre-existing out of facility DNR order (yellow form or pink MOST form) -      Chief Complaint  Patient presents with  . New Admit To SNF    New Admission Visit     HPI: Patient is a 84 y.o. female seen today for admission visit. She resided in independent living facility prior to this. She had a fall and went to the ED on 08/17/17 with left elbow and hip pain. She had CT head that was negative for acute intracranial abnormalities. Bilateral hip xray was negative for acute fracture. She had xray of left elbow showing retracted olecranon fracture. She was seen by orthopedics and had posterior splint placed. Her ED visit note from 08/17/17 and 08/08/17 reviewed. Last ED visit she was placed on antibiotic for presumed bronchitis. She has completed that. On ED lab review,  Urine culture is positive  for pseudomonas UTI on lab from 08/08/17. She completed her course of keflex yesterday. Culture and sensitivity reviewed. She is seen in her room today.   Past Medical History:  Diagnosis Date  . Anemia, iron deficiency    takes Ferrous Sulfate daily  . Depression    takes Effexor daily  . Gait disorder 04/21/2014  . GERD (gastroesophageal reflux disease)    takes Omeprazole daily  . Herpes ocular    history of-takes Acyclovir daily  . High cholesterol    takes Atorvastatin daily  . History of hiatal hernia   . Hypertension    takes Metoprolol daily  . Insomnia    takes Xanax nightly  . Osteoarthritis of knee    bilateral  . Sciatic pain    Past Surgical History:  Procedure Laterality Date  . ABDOMINAL HYSTERECTOMY  1995   partial  . CARPAL TUNNEL RELEASE Right 07/21/2007  . CARPAL TUNNEL RELEASE Left 09/24/2007  . COLONOSCOPY    . DESCEMETS STRIPPING AUTOMATED ENDOTHELIAL KERATOPLASTY Left 03/14/2011  . DESCEMETS STRIPPING AUTOMATED ENDOTHELIAL KERATOPLASTY Right 08/20/2012  . EYE SURGERY Bilateral    cataract surgery  . EYE SURGERY Left    corneal transplant  . HERNIA REPAIR    . HIP ARTHROPLASTY Left 06/13/2013   Procedure: ARTHROPLASTY BIPOLAR HIP; Injection left shoulder;  Surgeon: Johnny Bridge, MD;  Location: [facility];  Service: Orthopedics;  Laterality: Left;  . JOINT REPLACEMENT     bilateral knees, left knee  . KNEE ARTHROSCOPY Right 05/11/2001  . KYPHOPLASTY  N/A 04/10/2015   Procedure: T11 Kyphoplasty;  Surgeon: Jovita Gamma, MD;  Location: [facility];  Service: Neurosurgery;  Laterality: N/A;  T11 Kyphoplasty  . KYPHOPLASTY N/A 05/06/2015   Procedure: KYPHOPLASTY Thoracic twelve;  Surgeon: Jovita Gamma, MD;  Location: [facility];  Service: Neurosurgery;  Laterality: N/A;  T12 Kyphoplasty  . LAPAROSCOPIC NISSEN FUNDOPLICATION  1/61/0960  . LUMBAR LAMINECTOMY/DECOMPRESSION MICRODISCECTOMY  08/29/2010   L2-S1  . SHOULDER ARTHROSCOPY WITH ROTATOR CUFF REPAIR  AND SUBACROMIAL DECOMPRESSION Right 01/01/2000  . TONSILLECTOMY     as child  . TOTAL HIP REVISION Left 07/08/2014   Procedure: TOTAL HIP REVISION;  Surgeon: Kerin Salen, MD;  Location: [facility];  Service: Orthopedics;  Laterality: Left;  . TOTAL KNEE ARTHROPLASTY Left 05/14/2004  . TOTAL KNEE ARTHROPLASTY  12/23/2011   Procedure: TOTAL KNEE ARTHROPLASTY;  Surgeon: Lorn Junes, MD;  Location: [facility];  Service: Orthopedics;  Laterality: Right;  DR [place] THIS CASE  . TRIGGER FINGER RELEASE Right 07/21/2007   thumb  . TRIGGER FINGER RELEASE Left 09/24/2007   middle finger  . TRIGGER FINGER RELEASE Right 03/10/2008   ring and little fingers  . TRIGGER FINGER RELEASE Right 09/02/2012   Procedure: RELEASE TRIGGER FINGER/A-1 PULLEY RIGHT INDEX FINGER;  Surgeon: Wynonia Sours, MD;  Location: [facility];  Service: Orthopedics;  Laterality: Right;  . TRIGGER FINGER RELEASE Left 12/22/2012   Procedure: RELEASE TRIGGER FINGER/A-1 PULLEY LEFT RING FINGER;  Surgeon: Wynonia Sours, MD;  Location: [facility];  Service: Orthopedics;  Laterality: Left;  Left     reports that she has quit smoking. she has never used smokeless tobacco. She reports that she uses drugs. She reports that she does not drink alcohol. Social History   Socioeconomic History  . Marital status: Married    Spouse name: Not on file  . Number of children: Not on file  . Years of education: Not on file  . Highest education level: Not on file  Social Needs  . Financial resource strain: Not on file  . Food insecurity - worry: Not on file  . Food insecurity - inability: Not on file  . Transportation needs - medical: Not on file  . Transportation needs - non-medical: Not on file  Occupational History  . Not on file  Tobacco Use  . Smoking status: Former Research scientist (life sciences)  . Smokeless tobacco: Never Used  . Tobacco comment: quit smoking 20-30 yr. ago  Substance and Sexual Activity  . Alcohol use: No   . Drug use: Yes  . Sexual activity: Not Currently  Other Topics Concern  . Not on file  Social History Narrative  . Not on file    Functional Status Survey:    Family History  Problem Relation Age of Onset  . Heart attack Mother   . Parkinsonism Father   . Diabetes Brother     Health Maintenance  Topic Date Due  . Samul Dada  12/24/1948  . PNA vac Low Risk Adult (2 of 2 - PCV13) 06/14/2014  . INFLUENZA VACCINE  Completed  . DEXA SCAN  Completed    Allergies  Allergen Reactions  . Morphine And Related Other (See Comments)    AGITATION, STRANGE DREAMS  . Scopolamine Other (See Comments)    MENTAL CHANGES Other reaction(s): Delusions (intolerance), Other (See Comments) MENTAL CHANGES  . Atorvastatin Other (See Comments)    On paperwork from facility Other reaction(s): Other (See Comments)  .  Morphine     Other reaction(s): Confusion (intolerance), Other (See Comments) Disoriented, mood changes  . Nsaids Other (See Comments)    On paperwork from facility Other reaction(s): Other (See Comments)  . Pravachol [Pravastatin Sodium] Other (See Comments)    On paperwork from facility  . Pravastatin     Other reaction(s): Other (See Comments)  . Teriparatide     Other reaction(s): Other (See Comments)    Outpatient Encounter Medications as of 08/19/2017  Medication Sig  . acetaminophen (TYLENOL) 500 MG tablet Take 500 mg by mouth every 6 (six) hours as needed for pain.  Marland Kitchen acetaminophen (TYLENOL) 500 MG tablet Take 500 mg by mouth 2 (two) times daily.  Marland Kitchen albuterol (PROVENTIL) (2.5 MG/3ML) 0.083% nebulizer solution Take 2.5 mg by nebulization every 4 (four) hours as needed for wheezing or shortness of breath.  Marland Kitchen aspirin EC 81 MG tablet Take 81 mg by mouth daily.  . cholecalciferol (VITAMIN D) 1000 UNITS tablet Take 2,000 Units by mouth daily.  Marland Kitchen Dextran 70-Hypromellose, PF, 0.1-0.3 % SOLN Place 1 drop into both eyes 4 (four) times daily.  Marland Kitchen Dextromethorphan-guaiFENesin  (ROBITUSSIN COUGH+CHEST CONG DM) 5-100 MG/5ML LIQD Take 10 mLs by mouth every 6 (six) hours as needed (cough).  . docusate sodium (COLACE) 100 MG capsule Take 1 capsule by mouth 4 (four) times daily as needed for mild constipation.   . ferrous sulfate 325 (65 FE) MG tablet Take 325 mg by mouth 2 (two) times daily. One tablet daily during the week, one tablet twice daily only on Saturday and Sunday  . fluticasone furoate-vilanterol (BREO ELLIPTA) 100-25 MCG/INH AEPB Inhale 1 puff into the lungs daily.  . hypromellose (GENTEAL SEVERE) 0.3 % GEL ophthalmic ointment Place 1 application into both eyes at bedtime as needed for dry eyes.  Marland Kitchen ibuprofen (ADVIL,MOTRIN) 600 MG tablet Take 600 mg by mouth every 8 (eight) hours as needed.  . loratadine (CLARITIN) 10 MG tablet Take 10 mg by mouth daily as needed for allergies.  Marland Kitchen LORazepam (ATIVAN) 1 MG tablet Take 1 mg by mouth 2 (two) times daily.  . magnesium hydroxide (MILK OF MAGNESIA) 400 MG/5ML suspension Take 15 mLs by mouth at bedtime as needed for mild constipation.   . metoprolol (LOPRESSOR) 50 MG tablet Take 25-50 mg by mouth 2 (two) times daily. Take 50 MG in the morning and take 25 MG at bedtime.  . nitroGLYCERIN (NITROSTAT) 0.4 MG SL tablet Place 0.4 mg under the tongue every 5 (five) minutes as needed for chest pain.  Marland Kitchen omeprazole (PRILOSEC) 20 MG capsule Take 20 mg by mouth daily.   . OXYGEN Inhale 2 L into the lungs as needed (If 02 stat drops below 90%).  . traMADol (ULTRAM) 50 MG tablet Take 50 mg by mouth every 6 (six) hours as needed.  . venlafaxine XR (EFFEXOR-XR) 150 MG 24 hr capsule Take 150 mg by mouth daily with breakfast.  . [DISCONTINUED] benzonatate (TESSALON) 100 MG capsule Take 1 capsule (100 mg total) by mouth 3 (three) times daily as needed for cough.  . [DISCONTINUED] cephALEXin (KEFLEX) 500 MG capsule Take 500 mg by mouth 2 (two) times daily.  . [DISCONTINUED] HYDROcodone-acetaminophen (NORCO/VICODIN) 5-325 MG tablet Take 1-2  tablets by mouth every 4 (four) hours as needed (mild pain).  . [DISCONTINUED] loperamide (IMODIUM A-D) 2 MG tablet Take 2 mg by mouth as directed.  . [DISCONTINUED] Memantine HCl-Donepezil HCl (NAMZARIC) 7-10 MG CP24 Take 1 tablet by mouth daily.  . [DISCONTINUED] saccharomyces  boulardii (FLORASTOR) 250 MG capsule Take 250 mg by mouth daily.   No facility-administered encounter medications on file as of 08/19/2017.     Review of Systems  Constitutional: Positive for fatigue. Negative for appetite change, chills, diaphoresis and fever.  HENT: Positive for hearing loss. Negative for congestion, mouth sores, sore throat and trouble swallowing.   Eyes: Positive for visual disturbance. Negative for pain and redness.       Has corrective glasses  Respiratory: Positive for cough. Negative for shortness of breath and wheezing.        With clear mucus  Cardiovascular: Negative for chest pain, palpitations and leg swelling.  Gastrointestinal: Negative for abdominal pain, constipation, diarrhea, nausea and vomiting.       Moved her bowel this am  Genitourinary: Negative for dysuria, frequency and pelvic pain.  Musculoskeletal: Positive for arthralgias and back pain.       Chronic low back pain and shoulder pain, denies pain to left elbow.   Skin: Negative for rash.  Neurological: Negative for dizziness, numbness and headaches.  Hematological: Bruises/bleeds easily.  Psychiatric/Behavioral: Positive for confusion. Negative for behavioral problems. The patient is not nervous/anxious.     Vitals:   08/19/17 1552  BP: (!) 150/60  Pulse: 82  Resp: 20  Temp: 98.3 F (36.8 C)  TempSrc: Oral  SpO2: 92%  Height: 5\' 3"  (1.6 m)   Body mass index is 21.79 kg/m. Physical Exam  Constitutional: No distress.  Thin built, frail, in no acute distress  HENT:  Head: Normocephalic and atraumatic.  Mouth/Throat: Oropharynx is clear and moist. No oropharyngeal exudate.  Eyes: Conjunctivae and EOM are  normal. Pupils are equal, round, and reactive to light. Right eye exhibits no discharge. Left eye exhibits no discharge.  Neck: Neck supple.  Cardiovascular: Normal rate and regular rhythm.  Pulmonary/Chest: Effort normal and breath sounds normal. She has no wheezes. She has no rales.  Abdominal: Soft. Bowel sounds are normal. There is no tenderness. There is no guarding.  Musculoskeletal: She exhibits deformity. She exhibits no edema.  Good dorsalis pedis, left arm in splint, able to move her fingers with good capillary refill, generalized weakness, kyphosis present  Lymphadenopathy:    She has no cervical adenopathy.  Neurological: She is alert.  Oriented to self and place, not to time  Skin: Skin is warm and dry. No rash noted. She is not diaphoretic.  Old surgical scar to both knees, bruise to right arm  Psychiatric: She has a normal mood and affect. Her behavior is normal.    Labs reviewed: Basic Metabolic Panel: Recent Labs    07/30/17 1113 08/03/17 1851 08/08/17 1135  NA 130* 131* 130*  K 4.3 4.4 4.5  CL 98* 96* 96*  CO2 21* 26 22  GLUCOSE 94 96 91  BUN 14 17 11   CREATININE 0.79 0.72 0.67  CALCIUM 8.9 9.1 9.1   Liver Function Tests: Recent Labs    07/30/17 1113 08/03/17 1851  AST 24 23  ALT 16 16  ALKPHOS 148* 142*  BILITOT 0.3 0.3  PROT 6.6 7.5  ALBUMIN 3.4* 4.0   No results for input(s): LIPASE, AMYLASE in the last 8760 hours. No results for input(s): AMMONIA in the last 8760 hours. CBC: Recent Labs    07/30/17 1113 08/03/17 1851 08/08/17 1135  WBC 8.4 6.8 8.0  NEUTROABS 5.2 3.5 4.8  HGB 12.1 13.0 13.1  HCT 35.8* 38.8 39.5  MCV 96.5 97.5 97.8  PLT 325 375 318  Cardiac Enzymes: No results for input(s): CKTOTAL, CKMB, CKMBINDEX, TROPONINI in the last 8760 hours. BNP: Invalid input(s): POCBNP Lab Results  Component Value Date   HGBA1C 5.8 06/29/2013   No results found for: TSH No results found for: VITAMINB12 No results found for: FOLATE No  results found for: IRON, TIBC, FERRITIN  Imaging and Procedures obtained prior to SNF admission: Dg Elbow Complete Left  Result Date: 08/17/2017 CLINICAL DATA:  Left elbow pain, swelling and bruising after falling in the bathroom. EXAM: LEFT ELBOW - COMPLETE 3+ VIEW COMPARISON:  None. FINDINGS: Fracture of the olecranon with retraction of the fracture fragment by 1.5 cm. No fracture of the humerus or radius is seen. Obviously, there is a joint effusion. IMPRESSION: Retracted olecranon fracture. Electronically Signed   By: Nelson Chimes M.D.   On: 08/17/2017 09:00   Ct Head Wo Contrast  Result Date: 08/17/2017 CLINICAL DATA:  Fall.  Pain. EXAM: CT HEAD WITHOUT CONTRAST TECHNIQUE: Contiguous axial images were obtained from the base of the skull through the vertex without intravenous contrast. COMPARISON:  None. FINDINGS: Brain: No subdural, epidural, or subarachnoid hemorrhage. Cerebellum, brainstem, and basal cisterns are normal. Ventricles and sulci are unremarkable. White matter changes identified. No acute cortical ischemia infarct. No mass effect or midline shift. Vascular: No hyperdense vessel or unexpected calcification. Skull: Normal. Negative for fracture or focal lesion. Sinuses/Orbits: No acute finding. Other: None. IMPRESSION: No acute intracranial abnormalities.  Chronic white matter changes. Electronically Signed   By: Dorise Bullion III M.D   On: 08/17/2017 12:00   Dg Hips Bilat With Pelvis 3-4 Views  Result Date: 08/17/2017 CLINICAL DATA:  Left greater than right hip pain after fall this morning EXAM: DG HIP (WITH OR WITHOUT PELVIS) 3-4V BILAT COMPARISON:  08/03/2017 pelvic and hip radiographs FINDINGS: No pelvic fracture or diastasis. Left total hip arthroplasty, with no evidence of hardware fracture or loosening. No hip dislocation. No acute osseous fracture in the hips. No suspicious focal osseous lesions. IMPRESSION: No acute osseous fracture or malalignment. Left total hip  arthroplasty, with no evidence of hardware complication. Electronically Signed   By: Ilona Sorrel M.D.   On: 08/17/2017 09:00    Assessment/Plan  Generalized weakness With fall with fracture and UTI. Will have her work with physical therapy and occupational therapy team to help with gait training and muscle strengthening exercises.fall precautions. Skin care. Encourage to be out of bed.   Unsteady gait Will have patient work with PT/OT as tolerated to regain strength and restore function.  Fall precautions are in place.  Pseudomonas UTI Hydration to be maintained. Maintain perineal care. Completed course of keflex yesterday. Monitor clinically.  Left olecranon fracture Continue to have splint in place. Make follow up with orthopedic. Continue tylenol but change from 500 mg bid to tid with q6h prn for breakthrough. D/c ibuprofen. Also on prn tramadol, change this to q8h prn only for severe pain. Monitor bowel movement.   Hyponatremia With dehydration from UTI. Encourage hydration. Check bmp  Iron def anemia Continue ferrous sulfate bid and monitor. Check cbc  Hypertension BP stable. Continue lopressor 50 mg in am and 25 mg at bedtime. Monitor BP q shift for now. check bmp  gerd Continue prilosec. S/p Nissen fundoplication  Depression Continue venlafaxine 150 mg daily and monitor.   Cognitive impairment Likely multifactorial with her deconditioning, recent acute bronchitis and UTI, new fracture, narcotics and benzos, low sodium level and possible dementia. SLP consult. Supportive care.   Family/ staff Communication:  reviewed care plan with patient and charge nurse. Pal is for patient to undergo therapy, make improvement and return to her apartment. Given her cognitive impairment, obtain SLP consult.    Labs/tests ordered: cbc with diff, cmp, PT, OT, SLP  Blanchie Serve, MD Internal Medicine [facility] [address] [place], [place]  [address] Cell Phone (Monday-Friday 8 am - 5 pm): [phone] On Call: [phone] and follow prompts after 5 pm and on weekends Office Phone: [phone] Office Fax: [phone]

## 2017-08-20 DIAGNOSIS — R2681 Unsteadiness on feet: Secondary | ICD-10-CM | POA: Diagnosis not present

## 2017-08-20 DIAGNOSIS — Z7389 Other problems related to life management difficulty: Secondary | ICD-10-CM | POA: Diagnosis not present

## 2017-08-20 DIAGNOSIS — R296 Repeated falls: Secondary | ICD-10-CM | POA: Diagnosis not present

## 2017-08-20 DIAGNOSIS — M6281 Muscle weakness (generalized): Secondary | ICD-10-CM | POA: Diagnosis not present

## 2017-08-20 DIAGNOSIS — R531 Weakness: Secondary | ICD-10-CM | POA: Diagnosis not present

## 2017-08-20 DIAGNOSIS — R278 Other lack of coordination: Secondary | ICD-10-CM | POA: Diagnosis not present

## 2017-08-21 DIAGNOSIS — R296 Repeated falls: Secondary | ICD-10-CM | POA: Diagnosis not present

## 2017-08-21 DIAGNOSIS — Z7389 Other problems related to life management difficulty: Secondary | ICD-10-CM | POA: Diagnosis not present

## 2017-08-21 DIAGNOSIS — M6281 Muscle weakness (generalized): Secondary | ICD-10-CM | POA: Diagnosis not present

## 2017-08-21 DIAGNOSIS — B965 Pseudomonas (aeruginosa) (mallei) (pseudomallei) as the cause of diseases classified elsewhere: Secondary | ICD-10-CM | POA: Diagnosis not present

## 2017-08-21 DIAGNOSIS — R2681 Unsteadiness on feet: Secondary | ICD-10-CM | POA: Diagnosis not present

## 2017-08-21 DIAGNOSIS — R278 Other lack of coordination: Secondary | ICD-10-CM | POA: Diagnosis not present

## 2017-08-21 DIAGNOSIS — R531 Weakness: Secondary | ICD-10-CM | POA: Diagnosis not present

## 2017-08-21 DIAGNOSIS — E871 Hypo-osmolality and hyponatremia: Secondary | ICD-10-CM | POA: Diagnosis not present

## 2017-08-21 LAB — CBC AND DIFFERENTIAL
HCT: 29 — AB (ref 36–46)
HEMOGLOBIN: 10.3 — AB (ref 12.0–16.0)
Platelets: 262 (ref 150–399)
WBC: 10.4

## 2017-08-21 LAB — BASIC METABOLIC PANEL
BUN: 17 (ref 4–21)
CREATININE: 0.6 (ref <=1.1)
GLUCOSE: 98
POTASSIUM: 4.4 (ref 3.4–5.3)
SODIUM: 129 — AB (ref 137–147)

## 2017-08-21 LAB — HEPATIC FUNCTION PANEL
ALT: 24 (ref 7–35)
AST: 21 (ref 13–35)
Alkaline Phosphatase: 139 — AB (ref 25–125)
Bilirubin, Total: 0.5

## 2017-08-22 ENCOUNTER — Non-Acute Institutional Stay (SKILLED_NURSING_FACILITY): Payer: Medicare Other | Admitting: Nurse Practitioner

## 2017-08-22 ENCOUNTER — Other Ambulatory Visit: Payer: Self-pay | Admitting: *Deleted

## 2017-08-22 DIAGNOSIS — M25552 Pain in left hip: Secondary | ICD-10-CM

## 2017-08-22 DIAGNOSIS — F418 Other specified anxiety disorders: Secondary | ICD-10-CM

## 2017-08-22 DIAGNOSIS — M6281 Muscle weakness (generalized): Secondary | ICD-10-CM | POA: Diagnosis not present

## 2017-08-22 DIAGNOSIS — B0239 Other herpes zoster eye disease: Secondary | ICD-10-CM | POA: Diagnosis not present

## 2017-08-22 DIAGNOSIS — D509 Iron deficiency anemia, unspecified: Secondary | ICD-10-CM | POA: Diagnosis not present

## 2017-08-22 DIAGNOSIS — N390 Urinary tract infection, site not specified: Secondary | ICD-10-CM | POA: Diagnosis not present

## 2017-08-22 DIAGNOSIS — E871 Hypo-osmolality and hyponatremia: Secondary | ICD-10-CM | POA: Diagnosis not present

## 2017-08-22 DIAGNOSIS — Z7389 Other problems related to life management difficulty: Secondary | ICD-10-CM | POA: Diagnosis not present

## 2017-08-22 DIAGNOSIS — R296 Repeated falls: Secondary | ICD-10-CM | POA: Diagnosis not present

## 2017-08-22 DIAGNOSIS — K5900 Constipation, unspecified: Secondary | ICD-10-CM | POA: Diagnosis not present

## 2017-08-22 DIAGNOSIS — R5383 Other fatigue: Secondary | ICD-10-CM

## 2017-08-22 DIAGNOSIS — I10 Essential (primary) hypertension: Secondary | ICD-10-CM | POA: Diagnosis not present

## 2017-08-22 DIAGNOSIS — J45901 Unspecified asthma with (acute) exacerbation: Secondary | ICD-10-CM | POA: Diagnosis not present

## 2017-08-22 DIAGNOSIS — R2681 Unsteadiness on feet: Secondary | ICD-10-CM | POA: Diagnosis not present

## 2017-08-22 DIAGNOSIS — R05 Cough: Secondary | ICD-10-CM | POA: Diagnosis not present

## 2017-08-22 DIAGNOSIS — S52025D Nondisplaced fracture of olecranon process without intraarticular extension of left ulna, subsequent encounter for closed fracture with routine healing: Secondary | ICD-10-CM | POA: Diagnosis not present

## 2017-08-22 DIAGNOSIS — R41841 Cognitive communication deficit: Secondary | ICD-10-CM | POA: Diagnosis not present

## 2017-08-22 DIAGNOSIS — Z9181 History of falling: Secondary | ICD-10-CM | POA: Diagnosis not present

## 2017-08-22 DIAGNOSIS — R531 Weakness: Secondary | ICD-10-CM | POA: Diagnosis not present

## 2017-08-22 DIAGNOSIS — R000 Tachycardia, unspecified: Secondary | ICD-10-CM | POA: Diagnosis not present

## 2017-08-22 DIAGNOSIS — Z96642 Presence of left artificial hip joint: Secondary | ICD-10-CM | POA: Diagnosis not present

## 2017-08-22 DIAGNOSIS — M2550 Pain in unspecified joint: Secondary | ICD-10-CM | POA: Diagnosis not present

## 2017-08-22 DIAGNOSIS — R062 Wheezing: Secondary | ICD-10-CM | POA: Diagnosis not present

## 2017-08-22 DIAGNOSIS — R1313 Dysphagia, pharyngeal phase: Secondary | ICD-10-CM | POA: Diagnosis not present

## 2017-08-22 DIAGNOSIS — R278 Other lack of coordination: Secondary | ICD-10-CM | POA: Diagnosis not present

## 2017-08-22 DIAGNOSIS — R079 Chest pain, unspecified: Secondary | ICD-10-CM | POA: Diagnosis not present

## 2017-08-22 NOTE — Progress Notes (Signed)
Location:  [facility] Room Number: 42-B Place of Service:  SNF (31) Provider:  Unita Detamore, Manxie  NP  Josetta Huddle, MD  Patient Care Team: Josetta Huddle, MD as PCP - General (Internal Medicine) [name], MD as Referring Physician (Ophthalmology)  Extended Emergency Contact Information Primary Emergency Contact: [name] Address: [address]          [place], [place] [address] [name] of [place] Phone: [phone] Mobile Phone: [phone] Relation: Son Secondary Emergency Contact: [name] Address: [address]          [place], [place] [address] [name] of [place] Phone: [phone] Mobile Phone: [phone] Relation: Son  Code Status:  Full Code Goals of care: Advanced Directive information Advanced Directives 08/22/2017  Does Patient Have a Medical Advance Directive? No  Type of Advance Directive -  Does patient want to make changes to medical advance directive? -  Copy of [place] in Chart? -  Would patient like information on creating a medical advance directive? No - Patient declined  Pre-existing out of facility DNR order (yellow form or pink MOST form) -     Chief Complaint  Patient presents with  . Acute Visit    Increased  (L) hip pain     HPI:  Pt is a 84 y.o. female seen today for an acute visit for increased left hip pain, sustained left olecranon fracture from fall 08/17/17, on  Posterior splint, f/u Ortho 08/26/17. X-ray hips in hospital was negative for fractures. Her pain is managed with Tylenol 500mg  tid, prn Tramadol 50mg  q8h. She is taking Effexor 150mg  qd, Lorazepam 1mg  bid to stabilize mood, she is rather sleepy during my visit today. 08/21/17 wbc 10.4, Hgb 10.3, on Fe, plt 262, neutrophils 63.3%, Na 129, K 4.4, Bun 17, creat 0.57, .    Past Medical History:  Diagnosis Date  . Anemia, iron deficiency    takes Ferrous Sulfate daily  . Depression    takes Effexor  daily  . Gait disorder 04/21/2014  . GERD (gastroesophageal reflux disease)    takes Omeprazole daily  . Herpes ocular    history of-takes Acyclovir daily  . High cholesterol    takes Atorvastatin daily  . History of hiatal hernia   . Hypertension    takes Metoprolol daily  . Insomnia    takes Xanax nightly  . Osteoarthritis of knee    bilateral  . Sciatic pain    Past Surgical History:  Procedure Laterality Date  . ABDOMINAL HYSTERECTOMY  1995   partial  . CARPAL TUNNEL RELEASE Right 07/21/2007  . CARPAL TUNNEL RELEASE Left 09/24/2007  . COLONOSCOPY    . DESCEMETS STRIPPING AUTOMATED ENDOTHELIAL KERATOPLASTY Left 03/14/2011  . DESCEMETS STRIPPING AUTOMATED ENDOTHELIAL KERATOPLASTY Right 08/20/2012  . EYE SURGERY Bilateral    cataract surgery  . EYE SURGERY Left    corneal transplant  . HERNIA REPAIR    . HIP ARTHROPLASTY Left 06/13/2013   Procedure: ARTHROPLASTY BIPOLAR HIP; Injection left shoulder;  Surgeon: Johnny Bridge, MD;  Location: [facility];  Service: Orthopedics;  Laterality: Left;  . JOINT REPLACEMENT     bilateral knees, left knee  . KNEE ARTHROSCOPY Right 05/11/2001  . KYPHOPLASTY N/A 04/10/2015   Procedure: T11 Kyphoplasty;  Surgeon: Jovita Gamma, MD;  Location: [facility];  Service: Neurosurgery;  Laterality: N/A;  T11 Kyphoplasty  . KYPHOPLASTY N/A 05/06/2015   Procedure: KYPHOPLASTY Thoracic twelve;  Surgeon: Jovita Gamma, MD;  Location: [facility];  Service: Neurosurgery;  Laterality: N/A;  T12 Kyphoplasty  . LAPAROSCOPIC NISSEN FUNDOPLICATION  8/84/1660  . LUMBAR LAMINECTOMY/DECOMPRESSION MICRODISCECTOMY  08/29/2010   L2-S1  . SHOULDER ARTHROSCOPY WITH ROTATOR CUFF REPAIR AND SUBACROMIAL DECOMPRESSION Right 01/01/2000  . TONSILLECTOMY     as child  . TOTAL HIP REVISION Left 07/08/2014   Procedure: TOTAL HIP REVISION;  Surgeon: Kerin Salen, MD;  Location: [facility];  Service: Orthopedics;  Laterality: Left;  . TOTAL KNEE ARTHROPLASTY Left 05/14/2004  .  TOTAL KNEE ARTHROPLASTY  12/23/2011   Procedure: TOTAL KNEE ARTHROPLASTY;  Surgeon: Lorn Junes, MD;  Location: [facility];  Service: Orthopedics;  Laterality: Right;  DR Penalosa THIS CASE  . TRIGGER FINGER RELEASE Right 07/21/2007   thumb  . TRIGGER FINGER RELEASE Left 09/24/2007   middle finger  . TRIGGER FINGER RELEASE Right 03/10/2008   ring and little fingers  . TRIGGER FINGER RELEASE Right 09/02/2012   Procedure: RELEASE TRIGGER FINGER/A-1 PULLEY RIGHT INDEX FINGER;  Surgeon: Wynonia Sours, MD;  Location: [facility];  Service: Orthopedics;  Laterality: Right;  . TRIGGER FINGER RELEASE Left 12/22/2012   Procedure: RELEASE TRIGGER FINGER/A-1 PULLEY LEFT RING FINGER;  Surgeon: Wynonia Sours, MD;  Location: [facility];  Service: Orthopedics;  Laterality: Left;  Left     Allergies  Allergen Reactions  . Morphine And Related Other (See Comments)    AGITATION, STRANGE DREAMS  . Scopolamine Other (See Comments)    MENTAL CHANGES Other reaction(s): Delusions (intolerance), Other (See Comments) MENTAL CHANGES  . Atorvastatin Other (See Comments)    On paperwork from facility Other reaction(s): Other (See Comments)  . Morphine     Other reaction(s): Confusion (intolerance), Other (See Comments) Disoriented, mood changes  . Nsaids Other (See Comments)    On paperwork from facility Other reaction(s): Other (See Comments)  . Pravachol [Pravastatin Sodium] Other (See Comments)    On paperwork from facility  . Pravastatin     Other reaction(s): Other (See Comments)  . Teriparatide     Other reaction(s): Other (See Comments)    Outpatient Encounter Medications as of 08/22/2017  Medication Sig  . acetaminophen (TYLENOL) 500 MG tablet Take 500 mg by mouth every 6 (six) hours as needed for pain.  Marland Kitchen acetaminophen (TYLENOL) 500 MG tablet Take 500 mg by mouth 2 (two) times daily.  Marland Kitchen albuterol (PROVENTIL) (2.5 MG/3ML) 0.083% nebulizer solution Take 2.5 mg  by nebulization every 4 (four) hours as needed for wheezing or shortness of breath.  Marland Kitchen aspirin EC 81 MG tablet Take 81 mg by mouth daily.  . cholecalciferol (VITAMIN D) 1000 UNITS tablet Take 2,000 Units by mouth daily.  Marland Kitchen Dextran 70-Hypromellose, PF, 0.1-0.3 % SOLN Place 1 drop into both eyes 4 (four) times daily.  Marland Kitchen Dextromethorphan-guaiFENesin (ROBITUSSIN COUGH+CHEST CONG DM) 5-100 MG/5ML LIQD Take 10 mLs by mouth every 6 (six) hours as needed (cough).  . docusate sodium (COLACE) 100 MG capsule Take 1 capsule by mouth 4 (four) times daily as needed for mild constipation.   . ferrous sulfate 325 (65 FE) MG tablet Take 325 mg by mouth 2 (two) times daily. One tablet daily during the week, one tablet twice daily only on Saturday and Sunday  . fluticasone furoate-vilanterol (BREO ELLIPTA) 100-25 MCG/INH AEPB Inhale 1 puff into the lungs daily.  . hypromellose (GENTEAL SEVERE) 0.3 % GEL ophthalmic ointment Place 1 application into both eyes at bedtime as  needed for dry eyes.  Marland Kitchen ibuprofen (ADVIL,MOTRIN) 600 MG tablet Take 600 mg by mouth every 8 (eight) hours as needed.  . loratadine (CLARITIN) 10 MG tablet Take 10 mg by mouth daily as needed for allergies.  Marland Kitchen LORazepam (ATIVAN) 1 MG tablet Take 1 mg by mouth 2 (two) times daily.  . magnesium hydroxide (MILK OF MAGNESIA) 400 MG/5ML suspension Take 15 mLs by mouth at bedtime as needed for mild constipation.   . metoprolol (LOPRESSOR) 50 MG tablet Take 25-50 mg by mouth 2 (two) times daily. Take 50 MG in the morning and take 25 MG at bedtime.  . nitroGLYCERIN (NITROSTAT) 0.4 MG SL tablet Place 0.4 mg under the tongue every 5 (five) minutes as needed for chest pain.  Marland Kitchen omeprazole (PRILOSEC) 20 MG capsule Take 20 mg by mouth daily.   . OXYGEN Inhale 2 L into the lungs as needed (If 02 stat drops below 90%).  . traMADol (ULTRAM) 50 MG tablet Take 50 mg by mouth every 6 (six) hours as needed.  . venlafaxine XR (EFFEXOR-XR) 150 MG 24 hr capsule Take 150 mg  by mouth daily with breakfast.   No facility-administered encounter medications on file as of 08/22/2017.    ROS was provided with assistance of staff Review of Systems  Constitutional: Positive for activity change, appetite change and fatigue. Negative for chills, diaphoresis and fever.  HENT: Positive for hearing loss. Negative for congestion and trouble swallowing.   Eyes: Negative for visual disturbance.  Respiratory: Negative for cough, chest tightness, shortness of breath and wheezing.   Cardiovascular: Negative for chest pain and palpitations.  Gastrointestinal: Negative for abdominal distention, abdominal pain, constipation, diarrhea, nausea and vomiting.  Endocrine: Negative for cold intolerance.  Genitourinary: Negative for difficulty urinating, dysuria and urgency.  Musculoskeletal: Positive for arthralgias, gait problem and joint swelling.       Left arm is in a posterior splint over the left elbow. Pain in the left hip  Skin:       Bruise left arm  Neurological: Negative for dizziness, tremors, facial asymmetry, speech difficulty, weakness and headaches.       Memory recall difficulties.   Psychiatric/Behavioral: Positive for confusion. Negative for agitation, hallucinations and sleep disturbance.    Immunization History  Administered Date(s) Administered  . Influenza-Unspecified 04/14/2017  . Pneumococcal Polysaccharide-23 06/14/2013   Pertinent  Health Maintenance Due  Topic Date Due  . PNA vac Low Risk Adult (2 of 2 - PCV13) 06/14/2014  . INFLUENZA VACCINE  Completed  . DEXA SCAN  Completed   No flowsheet data found. Functional Status Survey:    There were no vitals filed for this visit. There is no height or weight on file to calculate BMI. Physical Exam  Constitutional: She appears well-developed and well-nourished. No distress.  HENT:  Head: Atraumatic.  Eyes: Conjunctivae and EOM are normal. Pupils are equal, round, and reactive to light.  Neck: Normal  range of motion. Neck supple. No JVD present. No thyromegaly present.  Cardiovascular: Normal rate and regular rhythm.  No murmur heard. Pulmonary/Chest: Effort normal and breath sounds normal. She has no wheezes. She has no rales.  Abdominal: Soft. Bowel sounds are normal. She exhibits no distension. There is no tenderness. There is no rebound and no guarding.  Musculoskeletal: She exhibits edema and tenderness.  Left elbow is in a posterior splint, left arm swelling. Left hip pain palpated while in wheelchair, no straight leg raise pain, no pain with PROM of the left knee.  One person assist transfer and toileting.   Neurological: She is alert. She exhibits normal muscle tone. Coordination normal.  Oriented to person and place.   Skin: Skin is warm and dry. She is not diaphoretic.  Bruised left arm  Psychiatric: She has a normal mood and affect. Her behavior is normal.    Labs reviewed: Recent Labs    07/30/17 1113 08/03/17 1851 08/08/17 1135 08/21/17  NA 130* 131* 130* 129*  K 4.3 4.4 4.5 4.4  CL 98* 96* 96*  --   CO2 21* 26 22  --   GLUCOSE 94 96 91  --   BUN 14 17 11 17   CREATININE 0.79 0.72 0.67 0.6  CALCIUM 8.9 9.1 9.1  --    Recent Labs    07/30/17 1113 08/03/17 1851 08/21/17  AST 24 23 21   ALT 16 16 24   ALKPHOS 148* 142* 139*  BILITOT 0.3 0.3  --   PROT 6.6 7.5  --   ALBUMIN 3.4* 4.0  --    Recent Labs    07/30/17 1113 08/03/17 1851 08/08/17 1135 08/21/17  WBC 8.4 6.8 8.0 10.4  NEUTROABS 5.2 3.5 4.8  --   HGB 12.1 13.0 13.1 10.3*  HCT 35.8* 38.8 39.5 29*  MCV 96.5 97.5 97.8  --   PLT 325 375 318 262   No results found for: TSH Lab Results  Component Value Date   HGBA1C 5.8 06/29/2013   No results found for: CHOL, HDL, LDLCALC, LDLDIRECT, TRIG, CHOLHDL  Significant Diagnostic Results in last 30 days:  Dg Chest 2 View  Result Date: 08/08/2017 CLINICAL DATA:  Cough and congestion EXAM: CHEST  2 VIEW COMPARISON:  08/03/2017 FINDINGS: Cardiac shadow is  stable. Elevation of the right hemidiaphragm is noted. Minimal left basilar scarring is noted. No new focal infiltrate is seen. Changes of prior vertebral augmentation at T11 and T12 are noted. Compression deformities at T8, T9 and T10 are seen as well as T4. IMPRESSION: No acute abnormality noted. Multiple thoracic compression deformities are noted. Electronically Signed   By: Inez Catalina M.D.   On: 08/08/2017 12:12   Dg Chest 2 View  Result Date: 07/30/2017 CLINICAL DATA:  Shortness of breath, cough EXAM: CHEST  2 VIEW COMPARISON:  04/11/2016 FINDINGS: Left base atelectasis or scarring. No focal opacity on the right. Heart is normal size. Severe kyphosis and multiple moderate to severe compression fractures throughout the thoracic spine. Prior vertebroplasty at 2 levels in the lower thoracic spine. No acute bony abnormality. IMPRESSION: Left base scarring or atelectasis.  No active disease. Electronically Signed   By: Rolm Baptise M.D.   On: 07/30/2017 11:45   Dg Lumbar Spine Complete  Result Date: 08/08/2017 CLINICAL DATA:  Recent fall with back pain, initial encounter EXAM: LUMBAR SPINE - COMPLETE 4+ VIEW COMPARISON:  08/03/2017 FINDINGS: Lower thoracic compression deformities are again noted with changes of prior augmentation at T11 and T12. L4 compression deformity is seen similar to that noted on prior exam as well as prior MRI examination. No new compression deformity is seen. Anterolisthesis of L4 on L5 is noted stable from the prior exam. Facet hypertrophic changes are seen. Prior left hip replacement is noted. Aortic calcifications are seen without aneurysmal dilatation. IMPRESSION: Stable thoracic and lumbar compression deformities. No acute abnormality is noted. Electronically Signed   By: Inez Catalina M.D.   On: 08/08/2017 12:14   Dg Lumbar Spine Complete  Result Date: 08/03/2017 CLINICAL DATA:  Slid out of her  chair onto the floor and could not get back up, low back pain and BILATERAL hip  pain EXAM: LUMBAR SPINE - COMPLETE 4+ VIEW COMPARISON:  03/13/2017 FINDINGS: Five non-rib-bearing lumbar vertebra. Marked osseous demineralization. Multilevel disc space narrowing greatest at L4-L5. Minimal superior endplate height loss at L4 vertebral body appears unchanged. Prior height losses of T11 and T12 with prior spinal augmentation procedures. Minimal anterolisthesis at L4-L5, unchanged. No acute fracture, subluxation or bone destruction. Facet degenerative changes lower lumbar spine. Mild broad-based dextroconvex lumbar scoliosis. SI joints preserved. Atherosclerotic calcification aorta. IMPRESSION: Osseous demineralization with degenerative disc and facet disease changes of the lumbar spine. Old compression fractures at T11, T12 and superior endplate L4 with prior spinal augmentation procedures at T11 and T12. No acute abnormalities. Electronically Signed   By: Lavonia Dana M.D.   On: 08/03/2017 19:29   Dg Elbow Complete Left  Result Date: 08/17/2017 CLINICAL DATA:  Left elbow pain, swelling and bruising after falling in the bathroom. EXAM: LEFT ELBOW - COMPLETE 3+ VIEW COMPARISON:  None. FINDINGS: Fracture of the olecranon with retraction of the fracture fragment by 1.5 cm. No fracture of the humerus or radius is seen. Obviously, there is a joint effusion. IMPRESSION: Retracted olecranon fracture. Electronically Signed   By: Nelson Chimes M.D.   On: 08/17/2017 09:00   Ct Head Wo Contrast  Result Date: 08/17/2017 CLINICAL DATA:  Fall.  Pain. EXAM: CT HEAD WITHOUT CONTRAST TECHNIQUE: Contiguous axial images were obtained from the base of the skull through the vertex without intravenous contrast. COMPARISON:  None. FINDINGS: Brain: No subdural, epidural, or subarachnoid hemorrhage. Cerebellum, brainstem, and basal cisterns are normal. Ventricles and sulci are unremarkable. White matter changes identified. No acute cortical ischemia infarct. No mass effect or midline shift. Vascular: No hyperdense  vessel or unexpected calcification. Skull: Normal. Negative for fracture or focal lesion. Sinuses/Orbits: No acute finding. Other: None. IMPRESSION: No acute intracranial abnormalities.  Chronic white matter changes. Electronically Signed   By: Dorise Bullion III M.D   On: 08/17/2017 12:00   Ct Head Wo Contrast  Result Date: 08/03/2017 CLINICAL DATA:  Dizziness with fall EXAM: CT HEAD WITHOUT CONTRAST CT CERVICAL SPINE WITHOUT CONTRAST TECHNIQUE: Multidetector CT imaging of the head and cervical spine was performed following the standard protocol without intravenous contrast. Multiplanar CT image reconstructions of the cervical spine were also generated. COMPARISON:  CT head and CT cervical spine June 12, 2013 FINDINGS: CT HEAD FINDINGS Brain: There is moderate diffuse atrophy. There is no intracranial mass, hemorrhage, extra-axial fluid collection, or midline shift. There is patchy small vessel disease throughout the centra semiovale bilaterally. Small vessel disease is also noted in the anterior limb of each external capsule. There is no new gray-white compartment lesion. No evident acute infarct. Vascular: There is no appreciable hyperdense vessel. There is no appreciable vascular calcification. Skull: The bony calvarium appears intact. Sinuses/Orbits: There is mild mucosal thickening in several ethmoid air cells. Other visualized paranasal sinuses are clear. Orbits appear symmetric bilaterally. Other: Mastoid air cells are clear. CT CERVICAL SPINE FINDINGS Alignment: There is 1 mm of retrolisthesis of C3 on C4. There is 2 mm of retrolisthesis of C4 on C5. No other spondylolisthesis is evident. Skull base and vertebrae: Skull base and craniocervical junction regions appear normal. There is moderate pannus posterior to the odontoid which is not causing appreciable impression on the craniocervical junction. Bones are osteoporotic. There is no evident acute fracture. No blastic or lytic bone lesions are  evident. Soft  tissues and spinal canal: The prevertebral soft tissues and predental space regions are within normal limits. No paraspinous lesions. No evident cord or canal hematoma. Disc levels: There is severe disc space narrowing at C4-5. There is moderate disc space narrowing at C3-4 and C5-6. There are prominent anterior osteophytes at C3, C4, and C5. There is exit foraminal narrowing due to bony hypertrophy at C4-5 on the right and at C5-6 on the left impressing on the respective exiting nerve roots at these levels. There is no frank disc extrusion or high-grade stenosis. Upper chest: Visualized upper lung zones are clear. Other: There is atherosclerotic calcification in both carotid arteries, more on the left than on the right. Calcifications also noted in both proximal subclavian arteries. IMPRESSION: CT head: Stable atrophy with patchy periventricular small vessel disease. No acute infarct. No mass or hemorrhage. There is mild mucosal thickening in several ethmoid air cells. CT cervical spine: No appreciable fracture. Areas of mild spondylolisthesis are felt to be due to underlying spondylosis. There are multiple foci of spondylosis/osteoarthritis. Bones are osteoporotic. There is bilateral carotid artery calcification as well as foci of calcification in each proximal subclavian artery. Electronically Signed   By: Lowella Grip III M.D.   On: 08/03/2017 19:45   Ct Angio Chest Pe W Or Wo Contrast  Result Date: 08/08/2017 CLINICAL DATA:  PE suspected. High pretest probability. [place] out of chair earlier today. EXAM: CT ANGIOGRAPHY CHEST WITH CONTRAST TECHNIQUE: Multidetector CT imaging of the chest was performed using the standard protocol during bolus administration of intravenous contrast. Multiplanar CT image reconstructions and MIPs were obtained to evaluate the vascular anatomy. CONTRAST:  <See Chart> ISOVUE-370 IOPAMIDOL (ISOVUE-370) INJECTION 76% COMPARISON:  None. FINDINGS: Cardiovascular:  Normal heart size. Aortic atherosclerosis identified. Calcifications within the LAD and left circumflex coronary arteries identified. No pericardial effusion. The main pulmonary artery is patent. No saddle embolus. No lobar or segmental pulmonary artery filling defects noted. Mediastinum/Nodes: Normal appearance of the thyroid gland. The trachea appears patent and is midline. Normal appearance of the esophagus. No enlarged mediastinal or hilar lymph nodes. Lungs/Pleura: No pleural effusion. Mild thickening identified within both lung bases. Subsegmental atelectasis noted in both lower lobes, left greater than right. No airspace consolidation. Upper Abdomen: No acute abnormality. Musculoskeletal: Thoracic kyphosis is identified. Multiple compression deformities are noted throughout the thoracic spine. Review of the MIP images confirms the above findings. IMPRESSION: 1. No evidence for acute pulmonary embolus. 2. Aortic atherosclerosis and 2 vessel coronary artery calcifications. Aortic Atherosclerosis (ICD10-I70.0). 3. Thoracic kyphosis with multi level thoracic compression deformities. Electronically Signed   By: Kerby Moors M.D.   On: 08/08/2017 14:20   Ct Cervical Spine Wo Contrast  Result Date: 08/03/2017 CLINICAL DATA:  Dizziness with fall EXAM: CT HEAD WITHOUT CONTRAST CT CERVICAL SPINE WITHOUT CONTRAST TECHNIQUE: Multidetector CT imaging of the head and cervical spine was performed following the standard protocol without intravenous contrast. Multiplanar CT image reconstructions of the cervical spine were also generated. COMPARISON:  CT head and CT cervical spine June 12, 2013 FINDINGS: CT HEAD FINDINGS Brain: There is moderate diffuse atrophy. There is no intracranial mass, hemorrhage, extra-axial fluid collection, or midline shift. There is patchy small vessel disease throughout the centra semiovale bilaterally. Small vessel disease is also noted in the anterior limb of each external capsule.  There is no new gray-white compartment lesion. No evident acute infarct. Vascular: There is no appreciable hyperdense vessel. There is no appreciable vascular calcification. Skull: The bony calvarium appears intact.  Sinuses/Orbits: There is mild mucosal thickening in several ethmoid air cells. Other visualized paranasal sinuses are clear. Orbits appear symmetric bilaterally. Other: Mastoid air cells are clear. CT CERVICAL SPINE FINDINGS Alignment: There is 1 mm of retrolisthesis of C3 on C4. There is 2 mm of retrolisthesis of C4 on C5. No other spondylolisthesis is evident. Skull base and vertebrae: Skull base and craniocervical junction regions appear normal. There is moderate pannus posterior to the odontoid which is not causing appreciable impression on the craniocervical junction. Bones are osteoporotic. There is no evident acute fracture. No blastic or lytic bone lesions are evident. Soft tissues and spinal canal: The prevertebral soft tissues and predental space regions are within normal limits. No paraspinous lesions. No evident cord or canal hematoma. Disc levels: There is severe disc space narrowing at C4-5. There is moderate disc space narrowing at C3-4 and C5-6. There are prominent anterior osteophytes at C3, C4, and C5. There is exit foraminal narrowing due to bony hypertrophy at C4-5 on the right and at C5-6 on the left impressing on the respective exiting nerve roots at these levels. There is no frank disc extrusion or high-grade stenosis. Upper chest: Visualized upper lung zones are clear. Other: There is atherosclerotic calcification in both carotid arteries, more on the left than on the right. Calcifications also noted in both proximal subclavian arteries. IMPRESSION: CT head: Stable atrophy with patchy periventricular small vessel disease. No acute infarct. No mass or hemorrhage. There is mild mucosal thickening in several ethmoid air cells. CT cervical spine: No appreciable fracture. Areas of mild  spondylolisthesis are felt to be due to underlying spondylosis. There are multiple foci of spondylosis/osteoarthritis. Bones are osteoporotic. There is bilateral carotid artery calcification as well as foci of calcification in each proximal subclavian artery. Electronically Signed   By: Lowella Grip III M.D.   On: 08/03/2017 19:45   Dg Chest Port 1 View  Result Date: 08/03/2017 CLINICAL DATA:  Dizziness, anxiety, hypertension, cough EXAM: PORTABLE CHEST 1 VIEW COMPARISON:  Portable exam 1800 hours compared to 07/30/2017 FINDINGS: Borderline enlargement of cardiac silhouette. Atherosclerotic calcification aorta. Mediastinal contours and pulmonary vascularity normal. Minimal LEFT basilar scarring. Lungs otherwise clear. No infiltrate, pleural effusion or pneumothorax. Bones demineralized. IMPRESSION: Minimal LEFT basilar scarring Electronically Signed   By: Lavonia Dana M.D.   On: 08/03/2017 18:23   Dg Hips Bilat W Or Wo Pelvis 2 Views  Result Date: 08/03/2017 CLINICAL DATA:  Slid out of her chair onto the floor and could not get back up, low back pain and BILATERAL hip pain EXAM: DG HIP (WITH OR WITHOUT PELVIS) 2V BILAT COMPARISON:  None FINDINGS: Marked osseous demineralization. SI joints and RIGHT hip joint space preserved. LEFT hip prosthesis noted without periprosthetic lucency. No acute fracture, dislocation, or bone destruction. Numerous pelvic phleboliths. IMPRESSION: Osseous demineralization and LEFT hip prosthesis. No acute abnormalities. Electronically Signed   By: Lavonia Dana M.D.   On: 08/03/2017 19:27   Dg Hips Bilat With Pelvis 3-4 Views  Result Date: 08/17/2017 CLINICAL DATA:  Left greater than right hip pain after fall this morning EXAM: DG HIP (WITH OR WITHOUT PELVIS) 3-4V BILAT COMPARISON:  08/03/2017 pelvic and hip radiographs FINDINGS: No pelvic fracture or diastasis. Left total hip arthroplasty, with no evidence of hardware fracture or loosening. No hip dislocation. No acute  osseous fracture in the hips. No suspicious focal osseous lesions. IMPRESSION: No acute osseous fracture or malalignment. Left total hip arthroplasty, with no evidence of hardware complication. Electronically Signed  By: Ilona Sorrel M.D.   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 X-ray hips in hospital was negative for fractures. Her pain is managed with Tylenol 500mg  tid, prn Tramadol 50mg  q8h. Will obtain X-ray ap lateral oblique views of the left hip, x-ray pelvis to evaluate further  Hyponatremia 08/21/17 Na 129 08/21/17, encourage oral intake, repeat BMP   Depression with anxiety She is taking Effexor 150mg  qd, Lorazepam 1mg  bid to stabilize mood, she is rather sleepy during my visit today. 08/21/17 wbc 10.4, Hgb 10.3, on Fe, plt 262, neutrophils 63.3%, Na 129, K 4.4, Bun 17, creat 0.57, . Decrease Lorazepam 0.5mg /1mg  bid, observe.    Lethargy Decrease Lorazepam 0.5mg /1mg  bid, observe.      Family/ staff Communication: plan of care reviewed with the patient, patient's son, and charge nurse.   Labs/tests ordered:  BMP, X-ray 3 views left hip, X-ray pelvis.  Time spend 25 minutes.

## 2017-08-22 NOTE — Assessment & Plan Note (Signed)
08/21/17 Na 129 08/21/17, encourage oral intake, repeat BMP

## 2017-08-22 NOTE — Assessment & Plan Note (Signed)
She is taking Effexor 150mg  qd, Lorazepam 1mg  bid to stabilize mood, she is rather sleepy during my visit today. 08/21/17 wbc 10.4, Hgb 10.3, on Fe, plt 262, neutrophils 63.3%, Na 129, K 4.4, Bun 17, creat 0.57, . Decrease Lorazepam 0.5mg /1mg  bid, observe.

## 2017-08-22 NOTE — Assessment & Plan Note (Signed)
Decrease Lorazepam 0.5mg /1mg  bid, observe.

## 2017-08-22 NOTE — Assessment & Plan Note (Signed)
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 X-ray hips in hospital was negative for fractures. Her pain is managed with Tylenol 500mg  tid, prn Tramadol 50mg  q8h. Will obtain X-ray ap lateral oblique views of the left hip, x-ray pelvis to evaluate further

## 2017-08-23 DIAGNOSIS — R102 Pelvic and perineal pain: Secondary | ICD-10-CM | POA: Diagnosis not present

## 2017-08-23 DIAGNOSIS — M25552 Pain in left hip: Secondary | ICD-10-CM | POA: Diagnosis not present

## 2017-08-25 DIAGNOSIS — Z9181 History of falling: Secondary | ICD-10-CM | POA: Diagnosis not present

## 2017-08-25 DIAGNOSIS — R278 Other lack of coordination: Secondary | ICD-10-CM | POA: Diagnosis not present

## 2017-08-25 DIAGNOSIS — Z7389 Other problems related to life management difficulty: Secondary | ICD-10-CM | POA: Diagnosis not present

## 2017-08-25 DIAGNOSIS — M6281 Muscle weakness (generalized): Secondary | ICD-10-CM | POA: Diagnosis not present

## 2017-08-25 DIAGNOSIS — R296 Repeated falls: Secondary | ICD-10-CM | POA: Diagnosis not present

## 2017-08-25 DIAGNOSIS — R2681 Unsteadiness on feet: Secondary | ICD-10-CM | POA: Diagnosis not present

## 2017-08-26 DIAGNOSIS — M79644 Pain in right finger(s): Secondary | ICD-10-CM | POA: Diagnosis not present

## 2017-08-26 DIAGNOSIS — M6281 Muscle weakness (generalized): Secondary | ICD-10-CM | POA: Diagnosis not present

## 2017-08-26 DIAGNOSIS — R531 Weakness: Secondary | ICD-10-CM | POA: Diagnosis not present

## 2017-08-26 DIAGNOSIS — R278 Other lack of coordination: Secondary | ICD-10-CM | POA: Diagnosis not present

## 2017-08-26 DIAGNOSIS — Z9181 History of falling: Secondary | ICD-10-CM | POA: Diagnosis not present

## 2017-08-26 DIAGNOSIS — R2681 Unsteadiness on feet: Secondary | ICD-10-CM | POA: Diagnosis not present

## 2017-08-26 DIAGNOSIS — E871 Hypo-osmolality and hyponatremia: Secondary | ICD-10-CM | POA: Diagnosis not present

## 2017-08-26 DIAGNOSIS — Z7389 Other problems related to life management difficulty: Secondary | ICD-10-CM | POA: Diagnosis not present

## 2017-08-26 DIAGNOSIS — R296 Repeated falls: Secondary | ICD-10-CM | POA: Diagnosis not present

## 2017-08-26 DIAGNOSIS — B965 Pseudomonas (aeruginosa) (mallei) (pseudomallei) as the cause of diseases classified elsewhere: Secondary | ICD-10-CM | POA: Diagnosis not present

## 2017-08-26 LAB — BASIC METABOLIC PANEL
BUN: 16 (ref 4–21)
Creatinine: 0.7 (ref 0.5–1.1)
Glucose: 166
Potassium: 4.4 (ref 3.4–5.3)
Sodium: 127 — AB (ref 137–147)

## 2017-08-27 DIAGNOSIS — R2681 Unsteadiness on feet: Secondary | ICD-10-CM | POA: Diagnosis not present

## 2017-08-27 DIAGNOSIS — Z7389 Other problems related to life management difficulty: Secondary | ICD-10-CM | POA: Diagnosis not present

## 2017-08-27 DIAGNOSIS — M6281 Muscle weakness (generalized): Secondary | ICD-10-CM | POA: Diagnosis not present

## 2017-08-27 DIAGNOSIS — Z9181 History of falling: Secondary | ICD-10-CM | POA: Diagnosis not present

## 2017-08-27 DIAGNOSIS — R296 Repeated falls: Secondary | ICD-10-CM | POA: Diagnosis not present

## 2017-08-27 DIAGNOSIS — R278 Other lack of coordination: Secondary | ICD-10-CM | POA: Diagnosis not present

## 2017-08-28 ENCOUNTER — Encounter: Payer: Self-pay | Admitting: Nurse Practitioner

## 2017-08-28 DIAGNOSIS — R296 Repeated falls: Secondary | ICD-10-CM | POA: Diagnosis not present

## 2017-08-28 DIAGNOSIS — M6281 Muscle weakness (generalized): Secondary | ICD-10-CM | POA: Diagnosis not present

## 2017-08-28 DIAGNOSIS — S62309A Unspecified fracture of unspecified metacarpal bone, initial encounter for closed fracture: Secondary | ICD-10-CM | POA: Insufficient documentation

## 2017-08-28 DIAGNOSIS — R2681 Unsteadiness on feet: Secondary | ICD-10-CM | POA: Diagnosis not present

## 2017-08-28 DIAGNOSIS — R278 Other lack of coordination: Secondary | ICD-10-CM | POA: Diagnosis not present

## 2017-08-28 DIAGNOSIS — S52023A Displaced fracture of olecranon process without intraarticular extension of unspecified ulna, initial encounter for closed fracture: Secondary | ICD-10-CM | POA: Insufficient documentation

## 2017-08-28 DIAGNOSIS — Z7389 Other problems related to life management difficulty: Secondary | ICD-10-CM | POA: Diagnosis not present

## 2017-08-28 DIAGNOSIS — Z9181 History of falling: Secondary | ICD-10-CM | POA: Diagnosis not present

## 2017-08-29 DIAGNOSIS — M6281 Muscle weakness (generalized): Secondary | ICD-10-CM | POA: Diagnosis not present

## 2017-08-29 DIAGNOSIS — R278 Other lack of coordination: Secondary | ICD-10-CM | POA: Diagnosis not present

## 2017-08-29 DIAGNOSIS — Z9181 History of falling: Secondary | ICD-10-CM | POA: Diagnosis not present

## 2017-08-29 DIAGNOSIS — Z7389 Other problems related to life management difficulty: Secondary | ICD-10-CM | POA: Diagnosis not present

## 2017-08-29 DIAGNOSIS — R296 Repeated falls: Secondary | ICD-10-CM | POA: Diagnosis not present

## 2017-08-29 DIAGNOSIS — R2681 Unsteadiness on feet: Secondary | ICD-10-CM | POA: Diagnosis not present

## 2017-09-01 DIAGNOSIS — R2681 Unsteadiness on feet: Secondary | ICD-10-CM | POA: Diagnosis not present

## 2017-09-01 DIAGNOSIS — Z9181 History of falling: Secondary | ICD-10-CM | POA: Diagnosis not present

## 2017-09-01 DIAGNOSIS — R296 Repeated falls: Secondary | ICD-10-CM | POA: Diagnosis not present

## 2017-09-01 DIAGNOSIS — M6281 Muscle weakness (generalized): Secondary | ICD-10-CM | POA: Diagnosis not present

## 2017-09-01 DIAGNOSIS — R278 Other lack of coordination: Secondary | ICD-10-CM | POA: Diagnosis not present

## 2017-09-01 DIAGNOSIS — Z7389 Other problems related to life management difficulty: Secondary | ICD-10-CM | POA: Diagnosis not present

## 2017-09-02 ENCOUNTER — Non-Acute Institutional Stay (SKILLED_NURSING_FACILITY): Payer: Medicare Other | Admitting: Internal Medicine

## 2017-09-02 ENCOUNTER — Encounter: Payer: Self-pay | Admitting: Internal Medicine

## 2017-09-02 DIAGNOSIS — E871 Hypo-osmolality and hyponatremia: Secondary | ICD-10-CM

## 2017-09-02 DIAGNOSIS — I10 Essential (primary) hypertension: Secondary | ICD-10-CM | POA: Diagnosis not present

## 2017-09-02 DIAGNOSIS — F418 Other specified anxiety disorders: Secondary | ICD-10-CM

## 2017-09-02 DIAGNOSIS — R1312 Dysphagia, oropharyngeal phase: Secondary | ICD-10-CM | POA: Diagnosis not present

## 2017-09-02 DIAGNOSIS — K219 Gastro-esophageal reflux disease without esophagitis: Secondary | ICD-10-CM | POA: Diagnosis not present

## 2017-09-02 DIAGNOSIS — S52022S Displaced fracture of olecranon process without intraarticular extension of left ulna, sequela: Secondary | ICD-10-CM | POA: Diagnosis not present

## 2017-09-02 DIAGNOSIS — D509 Iron deficiency anemia, unspecified: Secondary | ICD-10-CM

## 2017-09-02 DIAGNOSIS — M6281 Muscle weakness (generalized): Secondary | ICD-10-CM | POA: Diagnosis not present

## 2017-09-02 DIAGNOSIS — B965 Pseudomonas (aeruginosa) (mallei) (pseudomallei) as the cause of diseases classified elsewhere: Secondary | ICD-10-CM | POA: Diagnosis not present

## 2017-09-02 DIAGNOSIS — Z7189 Other specified counseling: Secondary | ICD-10-CM | POA: Diagnosis not present

## 2017-09-02 DIAGNOSIS — J42 Unspecified chronic bronchitis: Secondary | ICD-10-CM | POA: Diagnosis not present

## 2017-09-02 DIAGNOSIS — R2681 Unsteadiness on feet: Secondary | ICD-10-CM | POA: Diagnosis not present

## 2017-09-02 DIAGNOSIS — Z7389 Other problems related to life management difficulty: Secondary | ICD-10-CM | POA: Diagnosis not present

## 2017-09-02 DIAGNOSIS — S52022A Displaced fracture of olecranon process without intraarticular extension of left ulna, initial encounter for closed fracture: Secondary | ICD-10-CM | POA: Insufficient documentation

## 2017-09-02 DIAGNOSIS — R296 Repeated falls: Secondary | ICD-10-CM | POA: Diagnosis not present

## 2017-09-02 DIAGNOSIS — Z9181 History of falling: Secondary | ICD-10-CM | POA: Diagnosis not present

## 2017-09-02 DIAGNOSIS — R278 Other lack of coordination: Secondary | ICD-10-CM | POA: Diagnosis not present

## 2017-09-02 LAB — BASIC METABOLIC PANEL
BUN: 17 (ref 4–21)
CREATININE: 0.5 (ref <=1.1)
Glucose: 101
POTASSIUM: 4.4 (ref 3.4–5.3)
SODIUM: 130 — AB (ref 137–147)

## 2017-09-02 NOTE — ACP (Advance Care Planning) (Signed)
Goals of care meeing today with her 2 sons/HCPOA present- one physically in the meeting room, another son over the telephone. Social worker and her intern present as well. Patient has a living will and HCPOA paperwork. DNR form present in patient's chart. Sons would not want any further hospitalization. They would like for comfort measures to be in place. Iv fluids if indicated for a defined trial period. Initially family were inclined towards having a feeding tube if needed. I have explained her current situation and with her dementia, progressive dysphagia and her refusing to even keep her splint in place for her recent fracture, having a feeding tube placed if dysphagia progresses, might not be necessarily a comfort measures for patient. Family agrees and No feeding tube is desired. They would like for use or limitation of antibiotic to be determined at time of infection. Form reviewed and signed by son Haylin Camilli who is also her HCPOA. Form reviewed and signed by myself and Education officer, museum as well. I have spent time from 2:30 pm to 2:49 pm reviewing goals of care and filling out MOST form and answering their questions.

## 2017-09-02 NOTE — Progress Notes (Signed)
Location:  [facility] Room Number: 42-B Place of Service:  SNF [phone]) Provider:  Blanchie Serve, MD  Blanchie Serve, MD  Patient Care Team: Blanchie Serve, MD as PCP - General (Internal Medicine) [name], MD as Referring Physician (Ophthalmology)  Extended Emergency Contact Information Primary Emergency Contact: [name] Address: [address]          [place], [place] [address] [name] of [place] Phone: [phone] Mobile Phone: [phone] Relation: Son Secondary Emergency Contact: [name] Address: [address]          [place], [place] [address] [name] of [place] Phone: [phone] Mobile Phone: [phone] Relation: Son  Code Status:  DNR  Goals of care: Advanced Directive information Advanced Directives 09/02/2017  Does Patient Have a Medical Advance Directive? Yes  Type of Advance Directive Out of facility DNR (pink MOST or yellow form)  Does patient want to make changes to medical advance directive? No - Patient declined  Copy of [place] in Chart? -  Would patient like information on creating a medical advance directive? -  Pre-existing out of facility DNR order (yellow form or pink MOST form) Yellow form placed in chart (order not valid for inpatient use)     Chief Complaint  Patient presents with  . Medical Management of Chronic Issues    routine visit, goals of care discussion    HPI:  Pt is a 84 y.o. female seen today for routine visit. She is pleasantly confused. Denies any concern this visit. She is on scheduled tylenol and tramadol as needed for pain from her recent fracture. She denies any pain this visit. She is taking her iron supplement. Blood pressure has been stable. Takes metoprolol for her blood pressure. Denies any reflux symptom. Breathing has been stable. Mood has been stable. She has retracted olecranon fracture to left elbow but refuses to wear her  splint. Her 2 sons/ HCPOA are here to review goals of care for patient. Goals is for long term care at the facility.   Past Medical History:  Diagnosis Date  . Anemia, iron deficiency    takes Ferrous Sulfate daily  . Depression    takes Effexor daily  . Gait disorder 04/21/2014  . GERD (gastroesophageal reflux disease)    takes Omeprazole daily  . Herpes ocular    history of-takes Acyclovir daily  . High cholesterol    takes Atorvastatin daily  . History of hiatal hernia   . Hypertension    takes Metoprolol daily  . Insomnia    takes Xanax nightly  . Osteoarthritis of knee    bilateral  . Sciatic pain    Past Surgical History:  Procedure Laterality Date  . ABDOMINAL HYSTERECTOMY  1995   partial  . CARPAL TUNNEL RELEASE Right 07/21/2007  . CARPAL TUNNEL RELEASE Left 09/24/2007  . COLONOSCOPY    . DESCEMETS STRIPPING AUTOMATED ENDOTHELIAL KERATOPLASTY Left 03/14/2011  . DESCEMETS STRIPPING AUTOMATED ENDOTHELIAL KERATOPLASTY Right 08/20/2012  . EYE SURGERY Bilateral    cataract surgery  . EYE SURGERY Left    corneal transplant  . HERNIA REPAIR    . HIP ARTHROPLASTY Left 06/13/2013   Procedure: ARTHROPLASTY BIPOLAR HIP; Injection left shoulder;  Surgeon: Johnny Bridge, MD;  Location: [facility];  Service: Orthopedics;  Laterality: Left;  . JOINT REPLACEMENT     bilateral knees, left knee  . KNEE ARTHROSCOPY Right 05/11/2001  . KYPHOPLASTY N/A 04/10/2015   Procedure: T11 Kyphoplasty;  Surgeon:  Jovita Gamma, MD;  Location: [facility];  Service: Neurosurgery;  Laterality: N/A;  T11 Kyphoplasty  . KYPHOPLASTY N/A 05/06/2015   Procedure: KYPHOPLASTY Thoracic twelve;  Surgeon: Jovita Gamma, MD;  Location: [facility];  Service: Neurosurgery;  Laterality: N/A;  T12 Kyphoplasty  . LAPAROSCOPIC NISSEN FUNDOPLICATION  12/22/1599  . LUMBAR LAMINECTOMY/DECOMPRESSION MICRODISCECTOMY  08/29/2010   L2-S1  . SHOULDER ARTHROSCOPY WITH ROTATOR CUFF REPAIR AND SUBACROMIAL DECOMPRESSION  Right 01/01/2000  . TONSILLECTOMY     as child  . TOTAL HIP REVISION Left 07/08/2014   Procedure: TOTAL HIP REVISION;  Surgeon: Kerin Salen, MD;  Location: [facility];  Service: Orthopedics;  Laterality: Left;  . TOTAL KNEE ARTHROPLASTY Left 05/14/2004  . TOTAL KNEE ARTHROPLASTY  12/23/2011   Procedure: TOTAL KNEE ARTHROPLASTY;  Surgeon: Lorn Junes, MD;  Location: [facility];  Service: Orthopedics;  Laterality: Right;  DR Joshua THIS CASE  . TRIGGER FINGER RELEASE Right 07/21/2007   thumb  . TRIGGER FINGER RELEASE Left 09/24/2007   middle finger  . TRIGGER FINGER RELEASE Right 03/10/2008   ring and little fingers  . TRIGGER FINGER RELEASE Right 09/02/2012   Procedure: RELEASE TRIGGER FINGER/A-1 PULLEY RIGHT INDEX FINGER;  Surgeon: Wynonia Sours, MD;  Location: [facility];  Service: Orthopedics;  Laterality: Right;  . TRIGGER FINGER RELEASE Left 12/22/2012   Procedure: RELEASE TRIGGER FINGER/A-1 PULLEY LEFT RING FINGER;  Surgeon: Wynonia Sours, MD;  Location: [facility];  Service: Orthopedics;  Laterality: Left;  Left     Allergies  Allergen Reactions  . Morphine And Related Other (See Comments)    AGITATION, STRANGE DREAMS  . Scopolamine Other (See Comments)    MENTAL CHANGES Other reaction(s): Delusions (intolerance), Other (See Comments) MENTAL CHANGES  . Atorvastatin Other (See Comments)    On paperwork from facility Other reaction(s): Other (See Comments)  . Morphine     Other reaction(s): Confusion (intolerance), Other (See Comments) Disoriented, mood changes  . Nsaids Other (See Comments)    On paperwork from facility Other reaction(s): Other (See Comments)  . Pravachol [Pravastatin Sodium] Other (See Comments)    On paperwork from facility  . Pravastatin     Other reaction(s): Other (See Comments)  . Teriparatide     Other reaction(s): Other (See Comments)    Outpatient Encounter Medications as of 09/02/2017  Medication Sig  .  acetaminophen (TYLENOL) 500 MG tablet Take 500 mg by mouth every 6 (six) hours as needed for pain.  Marland Kitchen acetaminophen (TYLENOL) 500 MG tablet Take 500 mg by mouth 3 (three) times daily.   Marland Kitchen albuterol (PROVENTIL) (2.5 MG/3ML) 0.083% nebulizer solution Take 2.5 mg by nebulization every 4 (four) hours as needed for wheezing or shortness of breath.  Marland Kitchen aspirin EC 81 MG tablet Take 81 mg by mouth daily.  . cholecalciferol (VITAMIN D) 1000 UNITS tablet Take 2,000 Units by mouth daily.  Marland Kitchen Dextran 70-Hypromellose, PF, 0.1-0.3 % SOLN Place 1 drop into both eyes 4 (four) times daily.  Marland Kitchen Dextromethorphan-guaiFENesin (ROBITUSSIN COUGH+CHEST CONG DM) 5-100 MG/5ML LIQD Take 10 mLs by mouth every 6 (six) hours as needed (cough).  . docusate sodium (COLACE) 100 MG capsule Take 1 capsule by mouth 4 (four) times daily as needed for mild constipation.   . ferrous sulfate 325 (65 FE) MG tablet Take 325 mg by mouth 2 (two) times daily. One tablet daily during the week, one tablet twice daily only on Saturday and Sunday  .  fluticasone furoate-vilanterol (BREO ELLIPTA) 100-25 MCG/INH AEPB Inhale 1 puff into the lungs daily.  . hypromellose (GENTEAL SEVERE) 0.3 % GEL ophthalmic ointment Place 1 application into both eyes at bedtime as needed for dry eyes.  Marland Kitchen loratadine (CLARITIN) 10 MG tablet Take 10 mg by mouth daily as needed for allergies.  Marland Kitchen LORazepam (ATIVAN) 1 MG tablet Take 0.5 mg by mouth 2 (two) times daily.   . magnesium hydroxide (MILK OF MAGNESIA) 400 MG/5ML suspension Take 15 mLs by mouth at bedtime as needed for mild constipation.   . metoprolol (LOPRESSOR) 50 MG tablet Take 25-50 mg by mouth 2 (two) times daily. Take 50 MG in the morning and take 25 MG at bedtime.  . nitroGLYCERIN (NITROSTAT) 0.4 MG SL tablet Place 0.4 mg under the tongue every 5 (five) minutes as needed for chest pain.  Marland Kitchen omeprazole (PRILOSEC) 20 MG capsule Take 20 mg by mouth daily.   . OXYGEN Inhale 2 L into the lungs as needed (If 02 stat  drops below 90%).  . traMADol (ULTRAM) 50 MG tablet Take 50 mg by mouth every 6 (six) hours as needed.  . venlafaxine XR (EFFEXOR-XR) 150 MG 24 hr capsule Take 150 mg by mouth at bedtime.   . [DISCONTINUED] ibuprofen (ADVIL,MOTRIN) 600 MG tablet Take 600 mg by mouth every 8 (eight) hours as needed.   No facility-administered encounter medications on file as of 09/02/2017.     Review of Systems  Constitutional: Positive for appetite change. Negative for chills and fever.  HENT: Positive for hearing loss and trouble swallowing. Negative for congestion, mouth sores and sore throat.   Respiratory: Negative for cough, shortness of breath and wheezing.   Cardiovascular: Negative for chest pain and palpitations.  Gastrointestinal: Negative for abdominal pain, constipation, diarrhea, nausea and vomiting.  Musculoskeletal: Positive for gait problem.       Denies pain this visit. Refuses to wear splint with recent history of fracture  Skin: Negative for rash.  Neurological: Negative for dizziness, seizures and headaches.  Hematological: Bruises/bleeds easily.  Psychiatric/Behavioral: Positive for confusion.    Immunization History  Administered Date(s) Administered  . Influenza-Unspecified 04/14/2017  . Pneumococcal Polysaccharide-23 06/14/2013   Pertinent  Health Maintenance Due  Topic Date Due  . PNA vac Low Risk Adult (2 of 2 - PCV13) 06/14/2014  . INFLUENZA VACCINE  Completed  . DEXA SCAN  Completed   No flowsheet data found. Functional Status Survey:    Vitals:   09/02/17 1414  BP: (!) 162/84  Pulse: 91  Resp: 20  Temp: 98.3 F (36.8 C)  TempSrc: Oral  SpO2: 100%  Weight: 122 lb (55.3 kg)  Height: 5\' 1"  (1.549 m)   Body mass index is 23.05 kg/m.   Wt Readings from Last 3 Encounters:  09/02/17 122 lb (55.3 kg)  08/08/17 123 lb (55.8 kg)  05/06/15 138 lb 12.8 oz (63 kg)   Physical Exam  Constitutional: No distress.  Frail, elderly female in no acute distress  HENT:   Head: Normocephalic and atraumatic.  Mouth/Throat: Oropharynx is clear and moist. No oropharyngeal exudate.  Eyes: Conjunctivae and EOM are normal. Pupils are equal, round, and reactive to light. Right eye exhibits no discharge. Left eye exhibits no discharge.  Neck: Normal range of motion. Neck supple.  Cardiovascular: Normal rate and regular rhythm.  Pulmonary/Chest: Effort normal and breath sounds normal. No respiratory distress. She has no wheezes. She has no rales.  Abdominal: Soft. Bowel sounds are normal. There is no  tenderness. There is no guarding.  Musculoskeletal: She exhibits no edema.  Able to move all 4 extremities, limited ROM with left elbow, bruise to both arms and hands, unsteady gait, uses wheelchair for ambulation  Lymphadenopathy:    She has no cervical adenopathy.  Neurological: She is alert.  Oriented to person and place  Skin: Skin is warm and dry. No rash noted. She is not diaphoretic.  Psychiatric: She has a normal mood and affect.      Labs reviewed: Recent Labs    07/30/17 1113 08/03/17 1851 08/08/17 1135 08/21/17 08/26/17  NA 130* 131* 130* 129* 127*  K 4.3 4.4 4.5 4.4 4.4  CL 98* 96* 96*  --   --   CO2 21* 26 22  --   --   GLUCOSE 94 96 91  --   --   BUN 14 17 11 17 16   CREATININE 0.79 0.72 0.67 0.6 0.7  CALCIUM 8.9 9.1 9.1  --   --    Recent Labs    07/30/17 1113 08/03/17 1851 08/21/17  AST 24 23 21   ALT 16 16 24   ALKPHOS 148* 142* 139*  BILITOT 0.3 0.3  --   PROT 6.6 7.5  --   ALBUMIN 3.4* 4.0  --    Recent Labs    07/30/17 1113 08/03/17 1851 08/08/17 1135 08/21/17  WBC 8.4 6.8 8.0 10.4  NEUTROABS 5.2 3.5 4.8  --   HGB 12.1 13.0 13.1 10.3*  HCT 35.8* 38.8 39.5 29*  MCV 96.5 97.5 97.8  --   PLT 325 375 318 262   No results found for: TSH Lab Results  Component Value Date   HGBA1C 5.8 06/29/2013   No results found for: CHOL, HDL, LDLCALC, LDLDIRECT, TRIG, CHOLHDL  Significant Diagnostic Results in last 30 days:  Dg Chest  2 View  Result Date: 08/08/2017 CLINICAL DATA:  Cough and congestion EXAM: CHEST  2 VIEW COMPARISON:  08/03/2017 FINDINGS: Cardiac shadow is stable. Elevation of the right hemidiaphragm is noted. Minimal left basilar scarring is noted. No new focal infiltrate is seen. Changes of prior vertebral augmentation at T11 and T12 are noted. Compression deformities at T8, T9 and T10 are seen as well as T4. IMPRESSION: No acute abnormality noted. Multiple thoracic compression deformities are noted. Electronically Signed   By: Inez Catalina M.D.   On: 08/08/2017 12:12   Dg Lumbar Spine Complete  Result Date: 08/08/2017 CLINICAL DATA:  Recent fall with back pain, initial encounter EXAM: LUMBAR SPINE - COMPLETE 4+ VIEW COMPARISON:  08/03/2017 FINDINGS: Lower thoracic compression deformities are again noted with changes of prior augmentation at T11 and T12. L4 compression deformity is seen similar to that noted on prior exam as well as prior MRI examination. No new compression deformity is seen. Anterolisthesis of L4 on L5 is noted stable from the prior exam. Facet hypertrophic changes are seen. Prior left hip replacement is noted. Aortic calcifications are seen without aneurysmal dilatation. IMPRESSION: Stable thoracic and lumbar compression deformities. No acute abnormality is noted. Electronically Signed   By: Inez Catalina M.D.   On: 08/08/2017 12:14   Dg Lumbar Spine Complete  Result Date: 08/03/2017 CLINICAL DATA:  Slid out of her chair onto the floor and could not get back up, low back pain and BILATERAL hip pain EXAM: LUMBAR SPINE - COMPLETE 4+ VIEW COMPARISON:  03/13/2017 FINDINGS: Five non-rib-bearing lumbar vertebra. Marked osseous demineralization. Multilevel disc space narrowing greatest at L4-L5. Minimal superior endplate height loss at  L4 vertebral body appears unchanged. Prior height losses of T11 and T12 with prior spinal augmentation procedures. Minimal anterolisthesis at L4-L5, unchanged. No acute  fracture, subluxation or bone destruction. Facet degenerative changes lower lumbar spine. Mild broad-based dextroconvex lumbar scoliosis. SI joints preserved. Atherosclerotic calcification aorta. IMPRESSION: Osseous demineralization with degenerative disc and facet disease changes of the lumbar spine. Old compression fractures at T11, T12 and superior endplate L4 with prior spinal augmentation procedures at T11 and T12. No acute abnormalities. Electronically Signed   By: Lavonia Dana M.D.   On: 08/03/2017 19:29   Dg Elbow Complete Left  Result Date: 08/17/2017 CLINICAL DATA:  Left elbow pain, swelling and bruising after falling in the bathroom. EXAM: LEFT ELBOW - COMPLETE 3+ VIEW COMPARISON:  None. FINDINGS: Fracture of the olecranon with retraction of the fracture fragment by 1.5 cm. No fracture of the humerus or radius is seen. Obviously, there is a joint effusion. IMPRESSION: Retracted olecranon fracture. Electronically Signed   By: Nelson Chimes M.D.   On: 08/17/2017 09:00   Ct Head Wo Contrast  Result Date: 08/17/2017 CLINICAL DATA:  Fall.  Pain. EXAM: CT HEAD WITHOUT CONTRAST TECHNIQUE: Contiguous axial images were obtained from the base of the skull through the vertex without intravenous contrast. COMPARISON:  None. FINDINGS: Brain: No subdural, epidural, or subarachnoid hemorrhage. Cerebellum, brainstem, and basal cisterns are normal. Ventricles and sulci are unremarkable. White matter changes identified. No acute cortical ischemia infarct. No mass effect or midline shift. Vascular: No hyperdense vessel or unexpected calcification. Skull: Normal. Negative for fracture or focal lesion. Sinuses/Orbits: No acute finding. Other: None. IMPRESSION: No acute intracranial abnormalities.  Chronic white matter changes. Electronically Signed   By: Dorise Bullion III M.D   On: 08/17/2017 12:00   Ct Head Wo Contrast  Result Date: 08/03/2017 CLINICAL DATA:  Dizziness with fall EXAM: CT HEAD WITHOUT CONTRAST CT  CERVICAL SPINE WITHOUT CONTRAST TECHNIQUE: Multidetector CT imaging of the head and cervical spine was performed following the standard protocol without intravenous contrast. Multiplanar CT image reconstructions of the cervical spine were also generated. COMPARISON:  CT head and CT cervical spine June 12, 2013 FINDINGS: CT HEAD FINDINGS Brain: There is moderate diffuse atrophy. There is no intracranial mass, hemorrhage, extra-axial fluid collection, or midline shift. There is patchy small vessel disease throughout the centra semiovale bilaterally. Small vessel disease is also noted in the anterior limb of each external capsule. There is no new gray-white compartment lesion. No evident acute infarct. Vascular: There is no appreciable hyperdense vessel. There is no appreciable vascular calcification. Skull: The bony calvarium appears intact. Sinuses/Orbits: There is mild mucosal thickening in several ethmoid air cells. Other visualized paranasal sinuses are clear. Orbits appear symmetric bilaterally. Other: Mastoid air cells are clear. CT CERVICAL SPINE FINDINGS Alignment: There is 1 mm of retrolisthesis of C3 on C4. There is 2 mm of retrolisthesis of C4 on C5. No other spondylolisthesis is evident. Skull base and vertebrae: Skull base and craniocervical junction regions appear normal. There is moderate pannus posterior to the odontoid which is not causing appreciable impression on the craniocervical junction. Bones are osteoporotic. There is no evident acute fracture. No blastic or lytic bone lesions are evident. Soft tissues and spinal canal: The prevertebral soft tissues and predental space regions are within normal limits. No paraspinous lesions. No evident cord or canal hematoma. Disc levels: There is severe disc space narrowing at C4-5. There is moderate disc space narrowing at C3-4 and C5-6. There are prominent anterior  osteophytes at C3, C4, and C5. There is exit foraminal narrowing due to bony hypertrophy  at C4-5 on the right and at C5-6 on the left impressing on the respective exiting nerve roots at these levels. There is no frank disc extrusion or high-grade stenosis. Upper chest: Visualized upper lung zones are clear. Other: There is atherosclerotic calcification in both carotid arteries, more on the left than on the right. Calcifications also noted in both proximal subclavian arteries. IMPRESSION: CT head: Stable atrophy with patchy periventricular small vessel disease. No acute infarct. No mass or hemorrhage. There is mild mucosal thickening in several ethmoid air cells. CT cervical spine: No appreciable fracture. Areas of mild spondylolisthesis are felt to be due to underlying spondylosis. There are multiple foci of spondylosis/osteoarthritis. Bones are osteoporotic. There is bilateral carotid artery calcification as well as foci of calcification in each proximal subclavian artery. Electronically Signed   By: Lowella Grip III M.D.   On: 08/03/2017 19:45   Ct Angio Chest Pe W Or Wo Contrast  Result Date: 08/08/2017 CLINICAL DATA:  PE suspected. High pretest probability. [place] out of chair earlier today. EXAM: CT ANGIOGRAPHY CHEST WITH CONTRAST TECHNIQUE: Multidetector CT imaging of the chest was performed using the standard protocol during bolus administration of intravenous contrast. Multiplanar CT image reconstructions and MIPs were obtained to evaluate the vascular anatomy. CONTRAST:  <See Chart> ISOVUE-370 IOPAMIDOL (ISOVUE-370) INJECTION 76% COMPARISON:  None. FINDINGS: Cardiovascular: Normal heart size. Aortic atherosclerosis identified. Calcifications within the LAD and left circumflex coronary arteries identified. No pericardial effusion. The main pulmonary artery is patent. No saddle embolus. No lobar or segmental pulmonary artery filling defects noted. Mediastinum/Nodes: Normal appearance of the thyroid gland. The trachea appears patent and is midline. Normal appearance of the esophagus. No  enlarged mediastinal or hilar lymph nodes. Lungs/Pleura: No pleural effusion. Mild thickening identified within both lung bases. Subsegmental atelectasis noted in both lower lobes, left greater than right. No airspace consolidation. Upper Abdomen: No acute abnormality. Musculoskeletal: Thoracic kyphosis is identified. Multiple compression deformities are noted throughout the thoracic spine. Review of the MIP images confirms the above findings. IMPRESSION: 1. No evidence for acute pulmonary embolus. 2. Aortic atherosclerosis and 2 vessel coronary artery calcifications. Aortic Atherosclerosis (ICD10-I70.0). 3. Thoracic kyphosis with multi level thoracic compression deformities. Electronically Signed   By: Kerby Moors M.D.   On: 08/08/2017 14:20   Ct Cervical Spine Wo Contrast  Result Date: 08/03/2017 CLINICAL DATA:  Dizziness with fall EXAM: CT HEAD WITHOUT CONTRAST CT CERVICAL SPINE WITHOUT CONTRAST TECHNIQUE: Multidetector CT imaging of the head and cervical spine was performed following the standard protocol without intravenous contrast. Multiplanar CT image reconstructions of the cervical spine were also generated. COMPARISON:  CT head and CT cervical spine June 12, 2013 FINDINGS: CT HEAD FINDINGS Brain: There is moderate diffuse atrophy. There is no intracranial mass, hemorrhage, extra-axial fluid collection, or midline shift. There is patchy small vessel disease throughout the centra semiovale bilaterally. Small vessel disease is also noted in the anterior limb of each external capsule. There is no new gray-white compartment lesion. No evident acute infarct. Vascular: There is no appreciable hyperdense vessel. There is no appreciable vascular calcification. Skull: The bony calvarium appears intact. Sinuses/Orbits: There is mild mucosal thickening in several ethmoid air cells. Other visualized paranasal sinuses are clear. Orbits appear symmetric bilaterally. Other: Mastoid air cells are clear. CT  CERVICAL SPINE FINDINGS Alignment: There is 1 mm of retrolisthesis of C3 on C4. There is 2 mm of retrolisthesis  of C4 on C5. No other spondylolisthesis is evident. Skull base and vertebrae: Skull base and craniocervical junction regions appear normal. There is moderate pannus posterior to the odontoid which is not causing appreciable impression on the craniocervical junction. Bones are osteoporotic. There is no evident acute fracture. No blastic or lytic bone lesions are evident. Soft tissues and spinal canal: The prevertebral soft tissues and predental space regions are within normal limits. No paraspinous lesions. No evident cord or canal hematoma. Disc levels: There is severe disc space narrowing at C4-5. There is moderate disc space narrowing at C3-4 and C5-6. There are prominent anterior osteophytes at C3, C4, and C5. There is exit foraminal narrowing due to bony hypertrophy at C4-5 on the right and at C5-6 on the left impressing on the respective exiting nerve roots at these levels. There is no frank disc extrusion or high-grade stenosis. Upper chest: Visualized upper lung zones are clear. Other: There is atherosclerotic calcification in both carotid arteries, more on the left than on the right. Calcifications also noted in both proximal subclavian arteries. IMPRESSION: CT head: Stable atrophy with patchy periventricular small vessel disease. No acute infarct. No mass or hemorrhage. There is mild mucosal thickening in several ethmoid air cells. CT cervical spine: No appreciable fracture. Areas of mild spondylolisthesis are felt to be due to underlying spondylosis. There are multiple foci of spondylosis/osteoarthritis. Bones are osteoporotic. There is bilateral carotid artery calcification as well as foci of calcification in each proximal subclavian artery. Electronically Signed   By: Lowella Grip III M.D.   On: 08/03/2017 19:45   Dg Chest Port 1 View  Result Date: 08/03/2017 CLINICAL DATA:  Dizziness,  anxiety, hypertension, cough EXAM: PORTABLE CHEST 1 VIEW COMPARISON:  Portable exam 1800 hours compared to 07/30/2017 FINDINGS: Borderline enlargement of cardiac silhouette. Atherosclerotic calcification aorta. Mediastinal contours and pulmonary vascularity normal. Minimal LEFT basilar scarring. Lungs otherwise clear. No infiltrate, pleural effusion or pneumothorax. Bones demineralized. IMPRESSION: Minimal LEFT basilar scarring Electronically Signed   By: Lavonia Dana M.D.   On: 08/03/2017 18:23   Dg Hips Bilat W Or Wo Pelvis 2 Views  Result Date: 08/03/2017 CLINICAL DATA:  Slid out of her chair onto the floor and could not get back up, low back pain and BILATERAL hip pain EXAM: DG HIP (WITH OR WITHOUT PELVIS) 2V BILAT COMPARISON:  None FINDINGS: Marked osseous demineralization. SI joints and RIGHT hip joint space preserved. LEFT hip prosthesis noted without periprosthetic lucency. No acute fracture, dislocation, or bone destruction. Numerous pelvic phleboliths. IMPRESSION: Osseous demineralization and LEFT hip prosthesis. No acute abnormalities. Electronically Signed   By: Lavonia Dana M.D.   On: 08/03/2017 19:27   Dg Hips Bilat With Pelvis 3-4 Views  Result Date: 08/17/2017 CLINICAL DATA:  Left greater than right hip pain after fall this morning EXAM: DG HIP (WITH OR WITHOUT PELVIS) 3-4V BILAT COMPARISON:  08/03/2017 pelvic and hip radiographs FINDINGS: No pelvic fracture or diastasis. Left total hip arthroplasty, with no evidence of hardware fracture or loosening. No hip dislocation. No acute osseous fracture in the hips. No suspicious focal osseous lesions. IMPRESSION: No acute osseous fracture or malalignment. Left total hip arthroplasty, with no evidence of hardware complication. Electronically Signed   By: Ilona Sorrel M.D.   On: 08/17/2017 09:00    Assessment/Plan  Hyponatremia  Encourage hydration  Anemia Check cbc with her on aspirin and drop in Hb. Check ferritin. Continue ferrous  sulfate  Left olecranon fracture Denies pain. Continue tylenol 500 mg  tid and tramadol as needed and monitor. PT and OT for gait training, strengthening and ROM exercises  Dysphagia SLP to follow with her impaired cognition, continue dysphagia diet and aspiration precautions  Hypertension Continue metoprolol 50 mg am and 25 mg bedtime  gerd Continue omeprazole 20 mg daily  Chronic depression Continue venlafaxine and monitor. Continue lorazepam for her anxiety.   Chronic bronchitis On breo with prn albuterol. Continue this and claritin for now  Goals of care counselling   Last ACP Note 06/04/2017 to 09/02/2017    ACP (Advance Care Planning) by Blanchie Serve, MD at 09/02/2017  2:13 PM    Date of Service   Author Author Type Status Note Type File Time  09/02/2017 Oleh Genin, MD Physician Signed ACP (Advance Care Planning) 09/02/2017        Goals of care meeing today with her 2 sons/HCPOA present- one physically in the meeting room, another son over the telephone. Social worker and her intern present as well. Patient has a living will and HCPOA paperwork. DNR form present in patient's chart. Sons would not want any further hospitalization. They would like for comfort measures to be in place. Iv fluids if indicated for a defined trial period. Initially family were inclined towards having a feeding tube if needed. I have explained her current situation and with her dementia, progressive dysphagia and her refusing to even keep her splint in place for her recent fracture, having a feeding tube placed if dysphagia progresses, might not be necessarily a comfort measures for patient. Family agrees and No feeding tube is desired. They would like for use or limitation of antibiotic to be determined at time of infection. Form reviewed and signed by son Makela Niehoff who is also her HCPOA. Form reviewed and signed by myself and Education officer, museum as well. I have spent time from 2:30 pm to 2:49 pm  reviewing goals of care and filling out MOST form and answering their questions.     Family/ staff Communication: reviewed care plan with patient, her sons and charge nurse.    Labs/tests ordered:  Cbc, BMP next lab  [facility], MD Internal Medicine [address], [place] [address] Cell Phone (Monday-Friday 8 am - 5 pm): [phone] On Call: [phone] and follow prompts after 5 pm and on weekends Office Phone: [phone] Office Fax: [phone]

## 2017-09-03 ENCOUNTER — Other Ambulatory Visit: Payer: Self-pay | Admitting: *Deleted

## 2017-09-03 DIAGNOSIS — R2681 Unsteadiness on feet: Secondary | ICD-10-CM | POA: Diagnosis not present

## 2017-09-03 DIAGNOSIS — M6281 Muscle weakness (generalized): Secondary | ICD-10-CM | POA: Diagnosis not present

## 2017-09-03 DIAGNOSIS — R278 Other lack of coordination: Secondary | ICD-10-CM | POA: Diagnosis not present

## 2017-09-03 DIAGNOSIS — Z9181 History of falling: Secondary | ICD-10-CM | POA: Diagnosis not present

## 2017-09-03 DIAGNOSIS — R296 Repeated falls: Secondary | ICD-10-CM | POA: Diagnosis not present

## 2017-09-03 DIAGNOSIS — Z7389 Other problems related to life management difficulty: Secondary | ICD-10-CM | POA: Diagnosis not present

## 2017-09-04 DIAGNOSIS — R278 Other lack of coordination: Secondary | ICD-10-CM | POA: Diagnosis not present

## 2017-09-04 DIAGNOSIS — Z7389 Other problems related to life management difficulty: Secondary | ICD-10-CM | POA: Diagnosis not present

## 2017-09-04 DIAGNOSIS — R2681 Unsteadiness on feet: Secondary | ICD-10-CM | POA: Diagnosis not present

## 2017-09-04 DIAGNOSIS — Z9181 History of falling: Secondary | ICD-10-CM | POA: Diagnosis not present

## 2017-09-04 DIAGNOSIS — M6281 Muscle weakness (generalized): Secondary | ICD-10-CM | POA: Diagnosis not present

## 2017-09-04 DIAGNOSIS — R296 Repeated falls: Secondary | ICD-10-CM | POA: Diagnosis not present

## 2017-09-04 LAB — IRON,TIBC AND FERRITIN PANEL: Ferritin: 872

## 2017-09-04 LAB — CBC AND DIFFERENTIAL
HCT: 28 — AB (ref 36–46)
Hemoglobin: 9.8 — AB (ref 12.0–16.0)
PLATELETS: 575 — AB (ref 150–399)
WBC: 7.1

## 2017-09-04 LAB — BASIC METABOLIC PANEL
BUN: 12 (ref 4–21)
CREATININE: 0.5 (ref <=1.1)
Glucose: 100
POTASSIUM: 4.5 (ref 3.4–5.3)
SODIUM: 132 — AB (ref 137–147)

## 2017-09-05 ENCOUNTER — Other Ambulatory Visit: Payer: Self-pay | Admitting: *Deleted

## 2017-09-05 DIAGNOSIS — R2681 Unsteadiness on feet: Secondary | ICD-10-CM | POA: Diagnosis not present

## 2017-09-05 DIAGNOSIS — M6281 Muscle weakness (generalized): Secondary | ICD-10-CM | POA: Diagnosis not present

## 2017-09-05 DIAGNOSIS — Z9181 History of falling: Secondary | ICD-10-CM | POA: Diagnosis not present

## 2017-09-05 DIAGNOSIS — R296 Repeated falls: Secondary | ICD-10-CM | POA: Diagnosis not present

## 2017-09-05 DIAGNOSIS — R278 Other lack of coordination: Secondary | ICD-10-CM | POA: Diagnosis not present

## 2017-09-05 DIAGNOSIS — Z7389 Other problems related to life management difficulty: Secondary | ICD-10-CM | POA: Diagnosis not present

## 2017-09-06 DIAGNOSIS — R296 Repeated falls: Secondary | ICD-10-CM | POA: Diagnosis not present

## 2017-09-06 DIAGNOSIS — R278 Other lack of coordination: Secondary | ICD-10-CM | POA: Diagnosis not present

## 2017-09-06 DIAGNOSIS — R2681 Unsteadiness on feet: Secondary | ICD-10-CM | POA: Diagnosis not present

## 2017-09-06 DIAGNOSIS — Z9181 History of falling: Secondary | ICD-10-CM | POA: Diagnosis not present

## 2017-09-06 DIAGNOSIS — M6281 Muscle weakness (generalized): Secondary | ICD-10-CM | POA: Diagnosis not present

## 2017-09-06 DIAGNOSIS — Z7389 Other problems related to life management difficulty: Secondary | ICD-10-CM | POA: Diagnosis not present

## 2017-09-08 DIAGNOSIS — Z7389 Other problems related to life management difficulty: Secondary | ICD-10-CM | POA: Diagnosis not present

## 2017-09-08 DIAGNOSIS — M6281 Muscle weakness (generalized): Secondary | ICD-10-CM | POA: Diagnosis not present

## 2017-09-08 DIAGNOSIS — Z9181 History of falling: Secondary | ICD-10-CM | POA: Diagnosis not present

## 2017-09-08 DIAGNOSIS — R296 Repeated falls: Secondary | ICD-10-CM | POA: Diagnosis not present

## 2017-09-08 DIAGNOSIS — R278 Other lack of coordination: Secondary | ICD-10-CM | POA: Diagnosis not present

## 2017-09-08 DIAGNOSIS — R2681 Unsteadiness on feet: Secondary | ICD-10-CM | POA: Diagnosis not present

## 2017-09-09 DIAGNOSIS — R296 Repeated falls: Secondary | ICD-10-CM | POA: Diagnosis not present

## 2017-09-09 DIAGNOSIS — R278 Other lack of coordination: Secondary | ICD-10-CM | POA: Diagnosis not present

## 2017-09-09 DIAGNOSIS — Z9181 History of falling: Secondary | ICD-10-CM | POA: Diagnosis not present

## 2017-09-09 DIAGNOSIS — R2681 Unsteadiness on feet: Secondary | ICD-10-CM | POA: Diagnosis not present

## 2017-09-09 DIAGNOSIS — M6281 Muscle weakness (generalized): Secondary | ICD-10-CM | POA: Diagnosis not present

## 2017-09-09 DIAGNOSIS — Z7389 Other problems related to life management difficulty: Secondary | ICD-10-CM | POA: Diagnosis not present

## 2017-09-10 DIAGNOSIS — Z7389 Other problems related to life management difficulty: Secondary | ICD-10-CM | POA: Diagnosis not present

## 2017-09-10 DIAGNOSIS — Z9181 History of falling: Secondary | ICD-10-CM | POA: Diagnosis not present

## 2017-09-10 DIAGNOSIS — R296 Repeated falls: Secondary | ICD-10-CM | POA: Diagnosis not present

## 2017-09-10 DIAGNOSIS — R2681 Unsteadiness on feet: Secondary | ICD-10-CM | POA: Diagnosis not present

## 2017-09-10 DIAGNOSIS — R278 Other lack of coordination: Secondary | ICD-10-CM | POA: Diagnosis not present

## 2017-09-10 DIAGNOSIS — M6281 Muscle weakness (generalized): Secondary | ICD-10-CM | POA: Diagnosis not present

## 2017-09-11 DIAGNOSIS — R2681 Unsteadiness on feet: Secondary | ICD-10-CM | POA: Diagnosis not present

## 2017-09-11 DIAGNOSIS — R278 Other lack of coordination: Secondary | ICD-10-CM | POA: Diagnosis not present

## 2017-09-11 DIAGNOSIS — Z7389 Other problems related to life management difficulty: Secondary | ICD-10-CM | POA: Diagnosis not present

## 2017-09-11 DIAGNOSIS — M6281 Muscle weakness (generalized): Secondary | ICD-10-CM | POA: Diagnosis not present

## 2017-09-11 DIAGNOSIS — Z9181 History of falling: Secondary | ICD-10-CM | POA: Diagnosis not present

## 2017-09-11 DIAGNOSIS — R296 Repeated falls: Secondary | ICD-10-CM | POA: Diagnosis not present

## 2017-09-12 DIAGNOSIS — R296 Repeated falls: Secondary | ICD-10-CM | POA: Diagnosis not present

## 2017-09-12 DIAGNOSIS — R278 Other lack of coordination: Secondary | ICD-10-CM | POA: Diagnosis not present

## 2017-09-12 DIAGNOSIS — Z9181 History of falling: Secondary | ICD-10-CM | POA: Diagnosis not present

## 2017-09-12 DIAGNOSIS — R2681 Unsteadiness on feet: Secondary | ICD-10-CM | POA: Diagnosis not present

## 2017-09-12 DIAGNOSIS — M6281 Muscle weakness (generalized): Secondary | ICD-10-CM | POA: Diagnosis not present

## 2017-09-12 DIAGNOSIS — Z7389 Other problems related to life management difficulty: Secondary | ICD-10-CM | POA: Diagnosis not present

## 2017-09-15 DIAGNOSIS — R2681 Unsteadiness on feet: Secondary | ICD-10-CM | POA: Diagnosis not present

## 2017-09-15 DIAGNOSIS — Z7389 Other problems related to life management difficulty: Secondary | ICD-10-CM | POA: Diagnosis not present

## 2017-09-15 DIAGNOSIS — R278 Other lack of coordination: Secondary | ICD-10-CM | POA: Diagnosis not present

## 2017-09-15 DIAGNOSIS — R296 Repeated falls: Secondary | ICD-10-CM | POA: Diagnosis not present

## 2017-09-15 DIAGNOSIS — Z9181 History of falling: Secondary | ICD-10-CM | POA: Diagnosis not present

## 2017-09-15 DIAGNOSIS — M6281 Muscle weakness (generalized): Secondary | ICD-10-CM | POA: Diagnosis not present

## 2017-09-16 DIAGNOSIS — M79644 Pain in right finger(s): Secondary | ICD-10-CM | POA: Diagnosis not present

## 2017-09-16 DIAGNOSIS — R2681 Unsteadiness on feet: Secondary | ICD-10-CM | POA: Diagnosis not present

## 2017-09-16 DIAGNOSIS — M25522 Pain in left elbow: Secondary | ICD-10-CM | POA: Diagnosis not present

## 2017-09-16 DIAGNOSIS — Z9181 History of falling: Secondary | ICD-10-CM | POA: Diagnosis not present

## 2017-09-16 DIAGNOSIS — M6281 Muscle weakness (generalized): Secondary | ICD-10-CM | POA: Diagnosis not present

## 2017-09-16 DIAGNOSIS — Z7389 Other problems related to life management difficulty: Secondary | ICD-10-CM | POA: Diagnosis not present

## 2017-09-16 DIAGNOSIS — R278 Other lack of coordination: Secondary | ICD-10-CM | POA: Diagnosis not present

## 2017-09-16 DIAGNOSIS — R296 Repeated falls: Secondary | ICD-10-CM | POA: Diagnosis not present

## 2017-09-17 DIAGNOSIS — R278 Other lack of coordination: Secondary | ICD-10-CM | POA: Diagnosis not present

## 2017-09-17 DIAGNOSIS — R296 Repeated falls: Secondary | ICD-10-CM | POA: Diagnosis not present

## 2017-09-17 DIAGNOSIS — Z7389 Other problems related to life management difficulty: Secondary | ICD-10-CM | POA: Diagnosis not present

## 2017-09-17 DIAGNOSIS — Z9181 History of falling: Secondary | ICD-10-CM | POA: Diagnosis not present

## 2017-09-17 DIAGNOSIS — M6281 Muscle weakness (generalized): Secondary | ICD-10-CM | POA: Diagnosis not present

## 2017-09-17 DIAGNOSIS — R2681 Unsteadiness on feet: Secondary | ICD-10-CM | POA: Diagnosis not present

## 2017-09-18 DIAGNOSIS — R296 Repeated falls: Secondary | ICD-10-CM | POA: Diagnosis not present

## 2017-09-18 DIAGNOSIS — M6281 Muscle weakness (generalized): Secondary | ICD-10-CM | POA: Diagnosis not present

## 2017-09-18 DIAGNOSIS — R2681 Unsteadiness on feet: Secondary | ICD-10-CM | POA: Diagnosis not present

## 2017-09-18 DIAGNOSIS — D510 Vitamin B12 deficiency anemia due to intrinsic factor deficiency: Secondary | ICD-10-CM | POA: Diagnosis not present

## 2017-09-18 DIAGNOSIS — D509 Iron deficiency anemia, unspecified: Secondary | ICD-10-CM | POA: Diagnosis not present

## 2017-09-18 DIAGNOSIS — Z9181 History of falling: Secondary | ICD-10-CM | POA: Diagnosis not present

## 2017-09-18 DIAGNOSIS — I10 Essential (primary) hypertension: Secondary | ICD-10-CM | POA: Diagnosis not present

## 2017-09-18 DIAGNOSIS — R278 Other lack of coordination: Secondary | ICD-10-CM | POA: Diagnosis not present

## 2017-09-18 DIAGNOSIS — Z7389 Other problems related to life management difficulty: Secondary | ICD-10-CM | POA: Diagnosis not present

## 2017-09-18 LAB — CBC AND DIFFERENTIAL
HCT: 32 — AB (ref 36–46)
Hemoglobin: 10.8 — AB (ref 12.0–16.0)
PLATELETS: 437 — AB (ref 150–399)
WBC: 6.7

## 2017-09-18 LAB — IRON,TIBC AND FERRITIN PANEL
Ferritin: 764
Iron: 51

## 2017-09-18 LAB — VITAMIN B12: Vitamin B-12: 408

## 2017-09-19 ENCOUNTER — Other Ambulatory Visit: Payer: Self-pay | Admitting: *Deleted

## 2017-09-19 ENCOUNTER — Non-Acute Institutional Stay (SKILLED_NURSING_FACILITY): Payer: Medicare Other

## 2017-09-19 DIAGNOSIS — R278 Other lack of coordination: Secondary | ICD-10-CM | POA: Diagnosis not present

## 2017-09-19 DIAGNOSIS — Z7389 Other problems related to life management difficulty: Secondary | ICD-10-CM | POA: Diagnosis not present

## 2017-09-19 DIAGNOSIS — R296 Repeated falls: Secondary | ICD-10-CM | POA: Diagnosis not present

## 2017-09-19 DIAGNOSIS — Z0000 Encounter for general adult medical examination without abnormal findings: Secondary | ICD-10-CM

## 2017-09-19 DIAGNOSIS — R2681 Unsteadiness on feet: Secondary | ICD-10-CM | POA: Diagnosis not present

## 2017-09-19 DIAGNOSIS — Z9181 History of falling: Secondary | ICD-10-CM | POA: Diagnosis not present

## 2017-09-19 DIAGNOSIS — M6281 Muscle weakness (generalized): Secondary | ICD-10-CM | POA: Diagnosis not present

## 2017-09-19 LAB — FOLATE: FOLATE: 6.9

## 2017-09-19 NOTE — Progress Notes (Addendum)
Subjective:   Kristin Solomon is a 84 y.o. female who presents for Medicare Annual (Subsequent) preventive examination at [facility]  Last AWV-06/06/2016    Objective:     Vitals: BP 110/60 (BP Location: Left Arm, Patient Position: Supine)   Pulse 85   Temp 97.7 F (36.5 C) (Oral)   Ht 5\' 1"  (1.549 m)   Wt 122 lb (55.3 kg)   SpO2 95%   BMI 23.05 kg/m   Body mass index is 23.05 kg/m.  Advanced Directives 09/19/2017 09/02/2017 09/02/2017 08/22/2017 08/03/2017 05/06/2015 05/05/2015  Does Patient Have a Medical Advance Directive? Yes Yes Yes No No Yes Yes  Type of Advance Directive Out of facility DNR (pink MOST or yellow form) [place];Living will;Out of facility DNR (pink MOST or yellow form) Out of facility DNR (pink MOST or yellow form) - - Press photographer;Living will [place];Living will  Does patient want to make changes to medical advance directive? No - Patient declined Yes (MAU/Ambulatory/Procedural Areas - Information given) No - Patient declined - - - -  Copy of [place] in Chart? - Yes - - - No - copy requested No - copy requested  Would patient like information on creating a medical advance directive? - Yes (MAU/Ambulatory/Procedural Areas - Information given) - No - Patient declined No - Patient declined - -  Pre-existing out of facility DNR order (yellow form or pink MOST form) Yellow form placed in chart (order not valid for inpatient use) Pink MOST form placed in chart (order not valid for inpatient use) Yellow form placed in chart (order not valid for inpatient use) - - - -    Tobacco Social History   Tobacco Use  Smoking Status Former Smoker  Smokeless Tobacco Never Used  Tobacco Comment   quit smoking 20-30 yr. ago     Counseling given: Not Answered Comment: quit smoking 20-30 yr. ago   Clinical Intake:  Pre-visit preparation completed: No  Pain :  No/denies pain     Nutritional Risks: None Diabetes: No  How often do you need to have someone help you when you read instructions, pamphlets, or other written materials from your doctor or pharmacy?: 2 - Rarely  Interpreter Needed?: No  Information entered by :: Tyson Dense, RN  Past Medical History:  Diagnosis Date  . Anemia, iron deficiency    takes Ferrous Sulfate daily  . Depression    takes Effexor daily  . Gait disorder 04/21/2014  . GERD (gastroesophageal reflux disease)    takes Omeprazole daily  . Herpes ocular    history of-takes Acyclovir daily  . High cholesterol    takes Atorvastatin daily  . History of hiatal hernia   . Hypertension    takes Metoprolol daily  . Insomnia    takes Xanax nightly  . Osteoarthritis of knee    bilateral  . Sciatic pain    Past Surgical History:  Procedure Laterality Date  . ABDOMINAL HYSTERECTOMY  1995   partial  . CARPAL TUNNEL RELEASE Right 07/21/2007  . CARPAL TUNNEL RELEASE Left 09/24/2007  . COLONOSCOPY    . DESCEMETS STRIPPING AUTOMATED ENDOTHELIAL KERATOPLASTY Left 03/14/2011  . DESCEMETS STRIPPING AUTOMATED ENDOTHELIAL KERATOPLASTY Right 08/20/2012  . EYE SURGERY Bilateral    cataract surgery  . EYE SURGERY Left    corneal transplant  . HERNIA REPAIR    . HIP ARTHROPLASTY Left 06/13/2013   Procedure: ARTHROPLASTY BIPOLAR  HIP; Injection left shoulder;  Surgeon: Johnny Bridge, MD;  Location: [place];  Service: Orthopedics;  Laterality: Left;  . JOINT REPLACEMENT     bilateral knees, left knee  . KNEE ARTHROSCOPY Right 05/11/2001  . KYPHOPLASTY N/A 04/10/2015   Procedure: T11 Kyphoplasty;  Surgeon: Jovita Gamma, MD;  Location: [facility];  Service: Neurosurgery;  Laterality: N/A;  T11 Kyphoplasty  . KYPHOPLASTY N/A 05/06/2015   Procedure: KYPHOPLASTY Thoracic twelve;  Surgeon: Jovita Gamma, MD;  Location: [place] [facility];  Service: Neurosurgery;  Laterality: N/A;  T12 Kyphoplasty  . LAPAROSCOPIC NISSEN  FUNDOPLICATION  0/81/4481  . LUMBAR LAMINECTOMY/DECOMPRESSION MICRODISCECTOMY  08/29/2010   L2-S1  . SHOULDER ARTHROSCOPY WITH ROTATOR CUFF REPAIR AND SUBACROMIAL DECOMPRESSION Right 01/01/2000  . TONSILLECTOMY     as child  . TOTAL HIP REVISION Left 07/08/2014   Procedure: TOTAL HIP REVISION;  Surgeon: Kerin Salen, MD;  Location: [facility];  Service: Orthopedics;  Laterality: Left;  . TOTAL KNEE ARTHROPLASTY Left 05/14/2004  . TOTAL KNEE ARTHROPLASTY  12/23/2011   Procedure: TOTAL KNEE ARTHROPLASTY;  Surgeon: Lorn Junes, MD;  Location: [facility];  Service: Orthopedics;  Laterality: Right;  DR [place] THIS CASE  . TRIGGER FINGER RELEASE Right 07/21/2007   thumb  . TRIGGER FINGER RELEASE Left 09/24/2007   middle finger  . TRIGGER FINGER RELEASE Right 03/10/2008   ring and little fingers  . TRIGGER FINGER RELEASE Right 09/02/2012   Procedure: RELEASE TRIGGER FINGER/A-1 PULLEY RIGHT INDEX FINGER;  Surgeon: Wynonia Sours, MD;  Location: [facility];  Service: Orthopedics;  Laterality: Right;  . TRIGGER FINGER RELEASE Left 12/22/2012   Procedure: RELEASE TRIGGER FINGER/A-1 PULLEY LEFT RING FINGER;  Surgeon: Wynonia Sours, MD;  Location: [facility];  Service: Orthopedics;  Laterality: Left;  Left    Family History  Problem Relation Age of Onset  . Heart attack Mother   . Parkinsonism Father   . Diabetes Brother    Social History   Socioeconomic History  . Marital status: Married    Spouse name: Not on file  . Number of children: Not on file  . Years of education: Not on file  . Highest education level: Not on file  Occupational History  . Not on file  Social Needs  . Financial resource strain: Not hard at all  . Food insecurity:    Worry: Never true    Inability: Never true  . Transportation needs:    Medical: No    Non-medical: No  Tobacco Use  . Smoking status: Former Research scientist (life sciences)  . Smokeless tobacco: Never Used  . Tobacco comment: quit  smoking 20-30 yr. ago  Substance and Sexual Activity  . Alcohol use: No  . Drug use: Yes  . Sexual activity: Not Currently  Lifestyle  . Physical activity:    Days per week: 0 days    Minutes per session: 0 min  . Stress: Not at all  Relationships  . Social connections:    Talks on phone: Twice a week    Gets together: Once a week    Attends religious service: Never    Active member of club or organization: No    Attends meetings of clubs or organizations: Never    Relationship status: Married  Other Topics Concern  . Not on file  Social History Narrative  . Not on file    Outpatient Encounter Medications as of 09/19/2017  Medication Sig  .  acetaminophen (TYLENOL) 500 MG tablet Take 500 mg by mouth every 6 (six) hours as needed for pain.  Marland Kitchen acetaminophen (TYLENOL) 500 MG tablet Take 500 mg by mouth 3 (three) times daily.   Marland Kitchen albuterol (PROVENTIL) (2.5 MG/3ML) 0.083% nebulizer solution Take 2.5 mg by nebulization every 4 (four) hours as needed for wheezing or shortness of breath.  Marland Kitchen aspirin EC 81 MG tablet Take 81 mg by mouth daily.  . cholecalciferol (VITAMIN D) 1000 UNITS tablet Take 2,000 Units by mouth daily.  Marland Kitchen Dextran 70-Hypromellose, PF, 0.1-0.3 % SOLN Place 1 drop into both eyes 4 (four) times daily.  Marland Kitchen Dextromethorphan-guaiFENesin (ROBITUSSIN COUGH+CHEST CONG DM) 5-100 MG/5ML LIQD Take 10 mLs by mouth every 6 (six) hours as needed (cough).  . docusate sodium (COLACE) 100 MG capsule Take 1 capsule by mouth 4 (four) times daily as needed for mild constipation.   . ferrous sulfate 325 (65 FE) MG tablet Take 325 mg by mouth 2 (two) times daily. One tablet daily during the week, one tablet twice daily only on Saturday and Sunday  . fluticasone furoate-vilanterol (BREO ELLIPTA) 100-25 MCG/INH AEPB Inhale 1 puff into the lungs daily.  . hypromellose (GENTEAL SEVERE) 0.3 % GEL ophthalmic ointment Place 1 application into both eyes at bedtime as needed for dry eyes.  Marland Kitchen loratadine  (CLARITIN) 10 MG tablet Take 10 mg by mouth daily as needed for allergies.  Marland Kitchen LORazepam (ATIVAN) 1 MG tablet Take 0.5 mg by mouth 2 (two) times daily.   . magnesium hydroxide (MILK OF MAGNESIA) 400 MG/5ML suspension Take 15 mLs by mouth at bedtime as needed for mild constipation.   . metoprolol (LOPRESSOR) 50 MG tablet Take 25-50 mg by mouth 2 (two) times daily. Take 50 MG in the morning and take 25 MG at bedtime.  . nitroGLYCERIN (NITROSTAT) 0.4 MG SL tablet Place 0.4 mg under the tongue every 5 (five) minutes as needed for chest pain.  Marland Kitchen omeprazole (PRILOSEC) 20 MG capsule Take 20 mg by mouth daily.   . OXYGEN Inhale 2 L into the lungs as needed (If 02 stat drops below 90%).  . traMADol (ULTRAM) 50 MG tablet Take 50 mg by mouth every 6 (six) hours as needed.  . venlafaxine XR (EFFEXOR-XR) 150 MG 24 hr capsule Take 150 mg by mouth at bedtime.    No facility-administered encounter medications on file as of 09/19/2017.     Activities of Daily Living In your present state of health, do you have any difficulty performing the following activities: 09/19/2017  Hearing? N  Vision? N  Difficulty concentrating or making decisions? Y  Walking or climbing stairs? Y  Dressing or bathing? Y  Doing errands, shopping? Y  Preparing Food and eating ? Y  Using the Toilet? Y  In the past six months, have you accidently leaked urine? Y  Do you have problems with loss of bowel control? N  Managing your Medications? Y  Managing your Finances? Y  Housekeeping or managing your Housekeeping? Y  Some recent data might be hidden    Patient Care Team: Blanchie Serve, MD as PCP - General (Internal Medicine) [name], MD as Referring Physician (Ophthalmology)    Assessment:   This is a routine wellness examination for [place].  Exercise Activities and Dietary recommendations Current Exercise Habits: The patient does not participate in regular exercise at present, Exercise limited by: orthopedic  condition(s)  Goals    None      Fall Risk Fall Risk  09/19/2017  Falls in the past year? No   Is the patient's home free of loose throw rugs in walkways, pet beds, electrical cords, etc?   yes      Grab bars in the bathroom? yes      Handrails on the stairs?   yes      Adequate lighting?   yes   Depression Screen PHQ 2/9 Scores 09/19/2017  PHQ - 2 Score 0     Cognitive Function MMSE - Mini Mental State Exam 09/19/2017  Orientation to time 0  Orientation to Place 4  Registration 3  Attention/ Calculation 3  Recall 0  Language- name 2 objects 2  Language- repeat 1  Language- follow 3 step command 3  Language- read & follow direction 1  Write a sentence 0  Copy design 0  Total score 17        Immunization History  Administered Date(s) Administered  . Influenza-Unspecified 04/14/2017  . Pneumococcal Polysaccharide-23 06/14/2013    Qualifies for Shingles Vaccine? Not in past records  Screening Tests Health Maintenance  Topic Date Due  . TETANUS/TDAP  12/24/1948  . PNA vac Low Risk Adult (2 of 2 - PCV13) 06/14/2014  . INFLUENZA VACCINE  Completed  . DEXA SCAN  Completed    Cancer Screenings: Lung: Low Dose CT Chest recommended if Age 70-80 years, 30 pack-year currently smoking OR have quit w/in 15years. Patient does not qualify. Breast:  Up to date on Mammogram? Yes   Up to date of Bone Density/Dexa? Yes Colorectal: up to date  Additional Screenings: Hepatitis C Screening: declined TDAP due and ordered Prevnar due and ordered     Plan:    I have personally reviewed and addressed the Medicare Annual Wellness questionnaire and have noted the following in the patient's chart:  A. Medical and social history B. Use of alcohol, tobacco or illicit drugs  C. Current medications and supplements D. Functional ability and status E.  Nutritional status F.  Physical activity G. Advance directives H. List of other physicians I.  Hospitalizations, surgeries,  and ER visits in previous 12 months J.  [place] to include hearing, vision, cognitive, depression L. Referrals and appointments - none  In addition, I have reviewed and discussed with patient certain preventive protocols, quality metrics, and best practice recommendations. A written personalized care plan for preventive services as well as general preventive health recommendations were provided to patient.  See attached scanned questionnaire for additional information.   Signed,   Tyson Dense, RN Nurse Health Advisor  Patient Concerns: None

## 2017-09-19 NOTE — Patient Instructions (Signed)
Kristin Solomon , Thank you for taking time to come for your Medicare Wellness Visit. I appreciate your ongoing commitment to your health goals. Please review the following plan we discussed and let me know if I can assist you in the future.   Screening recommendations/referrals: Colonoscopy excluded, over age 82 Mammogram excluded, over age 38 Bone Density up to date Recommended yearly ophthalmology/optometry visit for glaucoma screening and checkup Recommended yearly dental visit for hygiene and checkup  Vaccinations: Influenza vaccine up to date, due 2019 fall season Pneumococcal vaccine 13 due, ordered Tdap vaccine due, ordered Shingles vaccine not in past records    Advanced directives: in chart  Conditions/risks identified: none  Next appointment: Dr. Bubba Camp makes rounds   Preventive Care 25 Years and Older, Female Preventive care refers to lifestyle choices and visits with your health care provider that can promote health and wellness. What does preventive care include?  A yearly physical exam. This is also called an annual well check.  Dental exams once or twice a year.  Routine eye exams. Ask your health care provider how often you should have your eyes checked.  Personal lifestyle choices, including:  Daily care of your teeth and gums.  Regular physical activity.  Eating a healthy diet.  Avoiding tobacco and drug use.  Limiting alcohol use.  Practicing safe sex.  Taking low-dose aspirin every day.  Taking vitamin and mineral supplements as recommended by your health care provider. What happens during an annual well check? The services and screenings done by your health care provider during your annual well check will depend on your age, overall health, lifestyle risk factors, and family history of disease. Counseling  Your health care provider may ask you questions about your:  Alcohol use.  Tobacco use.  Drug use.  Emotional well-being.  Home and  relationship well-being.  Sexual activity.  Eating habits.  History of falls.  Memory and ability to understand (cognition).  Work and work Statistician.  Reproductive health. Screening  You may have the following tests or measurements:  Height, weight, and BMI.  Blood pressure.  Lipid and cholesterol levels. These may be checked every 5 years, or more frequently if you are over 21 years old.  Skin check.  Lung cancer screening. You may have this screening every year starting at age 77 if you have a 30-pack-year history of smoking and currently smoke or have quit within the past 15 years.  Fecal occult blood test (FOBT) of the stool. You may have this test every year starting at age 32.  Flexible sigmoidoscopy or colonoscopy. You may have a sigmoidoscopy every 5 years or a colonoscopy every 10 years starting at age 66.  Hepatitis C blood test.  Hepatitis B blood test.  Sexually transmitted disease (STD) testing.  Diabetes screening. This is done by checking your blood sugar (glucose) after you have not eaten for a while (fasting). You may have this done every 1-3 years.  Bone density scan. This is done to screen for osteoporosis. You may have this done starting at age 25.  Mammogram. This may be done every 1-2 years. Talk to your health care provider about how often you should have regular mammograms. Talk with your health care provider about your test results, treatment options, and if necessary, the need for more tests. Vaccines  Your health care provider may recommend certain vaccines, such as:  Influenza vaccine. This is recommended every year.  Tetanus, diphtheria, and acellular pertussis (Tdap, Td) vaccine. You may  need a Td booster every 10 years.  Zoster vaccine. You may need this after age 93.  Pneumococcal 13-valent conjugate (PCV13) vaccine. One dose is recommended after age 18.  Pneumococcal polysaccharide (PPSV23) vaccine. One dose is recommended after  age 59. Talk to your health care provider about which screenings and vaccines you need and how often you need them. This information is not intended to replace advice given to you by your health care provider. Make sure you discuss any questions you have with your health care provider. Document Released: 07/07/2015 Document Revised: 02/28/2016 Document Reviewed: 04/11/2015 Elsevier Interactive Patient Education  2017 [place] Prevention in the Home Falls can cause injuries. They can happen to people of all ages. There are many things you can do to make your home safe and to help prevent falls. What can I do on the outside of my home?  Regularly fix the edges of walkways and driveways and fix any cracks.  Remove anything that might make you trip as you walk through a door, such as a raised step or threshold.  Trim any bushes or trees on the path to your home.  Use bright outdoor lighting.  Clear any walking paths of anything that might make someone trip, such as rocks or tools.  Regularly check to see if handrails are loose or broken. Make sure that both sides of any steps have handrails.  Any raised decks and porches should have guardrails on the edges.  Have any leaves, snow, or ice cleared regularly.  Use sand or salt on walking paths during winter.  Clean up any spills in your garage right away. This includes oil or grease spills. What can I do in the bathroom?  Use night lights.  Install grab bars by the toilet and in the tub and shower. Do not use towel bars as grab bars.  Use non-skid mats or decals in the tub or shower.  If you need to sit down in the shower, use a plastic, non-slip stool.  Keep the floor dry. Clean up any water that spills on the floor as soon as it happens.  Remove soap buildup in the tub or shower regularly.  Attach bath mats securely with double-sided non-slip rug tape.  Do not have throw rugs and other things on the floor that can  make you trip. What can I do in the bedroom?  Use night lights.  Make sure that you have a light by your bed that is easy to reach.  Do not use any sheets or blankets that are too big for your bed. They should not hang down onto the floor.  Have a firm chair that has side arms. You can use this for support while you get dressed.  Do not have throw rugs and other things on the floor that can make you trip. What can I do in the kitchen?  Clean up any spills right away.  Avoid walking on wet floors.  Keep items that you use a lot in easy-to-reach places.  If you need to reach something above you, use a strong step stool that has a grab bar.  Keep electrical cords out of the way.  Do not use floor polish or wax that makes floors slippery. If you must use wax, use non-skid floor wax.  Do not have throw rugs and other things on the floor that can make you trip. What can I do with my stairs?  Do not leave any items on  the stairs.  Make sure that there are handrails on both sides of the stairs and use them. Fix handrails that are broken or loose. Make sure that handrails are as long as the stairways.  Check any carpeting to make sure that it is firmly attached to the stairs. Fix any carpet that is loose or worn.  Avoid having throw rugs at the top or bottom of the stairs. If you do have throw rugs, attach them to the floor with carpet tape.  Make sure that you have a light switch at the top of the stairs and the bottom of the stairs. If you do not have them, ask someone to add them for you. What else can I do to help prevent falls?  Wear shoes that:  Do not have high heels.  Have rubber bottoms.  Are comfortable and fit you well.  Are closed at the toe. Do not wear sandals.  If you use a stepladder:  Make sure that it is fully opened. Do not climb a closed stepladder.  Make sure that both sides of the stepladder are locked into place.  Ask someone to hold it for you,  if possible.  Clearly mark and make sure that you can see:  Any grab bars or handrails.  First and last steps.  Where the edge of each step is.  Use tools that help you move around (mobility aids) if they are needed. These include:  Canes.  Walkers.  Scooters.  Crutches.  Turn on the lights when you go into a dark area. Replace any light bulbs as soon as they burn out.  Set up your furniture so you have a clear path. Avoid moving your furniture around.  If any of your floors are uneven, fix them.  If there are any pets around you, be aware of where they are.  Review your medicines with your doctor. Some medicines can make you feel dizzy. This can increase your chance of falling. Ask your doctor what other things that you can do to help prevent falls. This information is not intended to replace advice given to you by your health care provider. Make sure you discuss any questions you have with your health care provider. Document Released: 04/06/2009 Document Revised: 11/16/2015 Document Reviewed: 07/15/2014 Elsevier Interactive Patient Education  2017 [employer].

## 2017-09-22 DIAGNOSIS — R05 Cough: Secondary | ICD-10-CM | POA: Diagnosis not present

## 2017-09-22 DIAGNOSIS — K5900 Constipation, unspecified: Secondary | ICD-10-CM | POA: Diagnosis not present

## 2017-09-22 DIAGNOSIS — M6281 Muscle weakness (generalized): Secondary | ICD-10-CM | POA: Diagnosis not present

## 2017-09-22 DIAGNOSIS — R079 Chest pain, unspecified: Secondary | ICD-10-CM | POA: Diagnosis not present

## 2017-09-22 DIAGNOSIS — R1313 Dysphagia, pharyngeal phase: Secondary | ICD-10-CM | POA: Diagnosis not present

## 2017-09-22 DIAGNOSIS — Z9181 History of falling: Secondary | ICD-10-CM | POA: Diagnosis not present

## 2017-09-22 DIAGNOSIS — Z96642 Presence of left artificial hip joint: Secondary | ICD-10-CM | POA: Diagnosis not present

## 2017-09-22 DIAGNOSIS — R278 Other lack of coordination: Secondary | ICD-10-CM | POA: Diagnosis not present

## 2017-09-22 DIAGNOSIS — B0239 Other herpes zoster eye disease: Secondary | ICD-10-CM | POA: Diagnosis not present

## 2017-09-22 DIAGNOSIS — R062 Wheezing: Secondary | ICD-10-CM | POA: Diagnosis not present

## 2017-09-22 DIAGNOSIS — D509 Iron deficiency anemia, unspecified: Secondary | ICD-10-CM | POA: Diagnosis not present

## 2017-09-22 DIAGNOSIS — J45901 Unspecified asthma with (acute) exacerbation: Secondary | ICD-10-CM | POA: Diagnosis not present

## 2017-09-22 DIAGNOSIS — N390 Urinary tract infection, site not specified: Secondary | ICD-10-CM | POA: Diagnosis not present

## 2017-09-22 DIAGNOSIS — R41841 Cognitive communication deficit: Secondary | ICD-10-CM | POA: Diagnosis not present

## 2017-09-22 DIAGNOSIS — M2550 Pain in unspecified joint: Secondary | ICD-10-CM | POA: Diagnosis not present

## 2017-09-22 DIAGNOSIS — Z7389 Other problems related to life management difficulty: Secondary | ICD-10-CM | POA: Diagnosis not present

## 2017-09-22 DIAGNOSIS — R2681 Unsteadiness on feet: Secondary | ICD-10-CM | POA: Diagnosis not present

## 2017-09-22 DIAGNOSIS — R531 Weakness: Secondary | ICD-10-CM | POA: Diagnosis not present

## 2017-09-22 DIAGNOSIS — R000 Tachycardia, unspecified: Secondary | ICD-10-CM | POA: Diagnosis not present

## 2017-09-22 DIAGNOSIS — S52025D Nondisplaced fracture of olecranon process without intraarticular extension of left ulna, subsequent encounter for closed fracture with routine healing: Secondary | ICD-10-CM | POA: Diagnosis not present

## 2017-09-22 DIAGNOSIS — R296 Repeated falls: Secondary | ICD-10-CM | POA: Diagnosis not present

## 2017-09-23 DIAGNOSIS — R2681 Unsteadiness on feet: Secondary | ICD-10-CM | POA: Diagnosis not present

## 2017-09-23 DIAGNOSIS — M6281 Muscle weakness (generalized): Secondary | ICD-10-CM | POA: Diagnosis not present

## 2017-09-23 DIAGNOSIS — R278 Other lack of coordination: Secondary | ICD-10-CM | POA: Diagnosis not present

## 2017-09-23 DIAGNOSIS — Z9181 History of falling: Secondary | ICD-10-CM | POA: Diagnosis not present

## 2017-09-23 DIAGNOSIS — S52025D Nondisplaced fracture of olecranon process without intraarticular extension of left ulna, subsequent encounter for closed fracture with routine healing: Secondary | ICD-10-CM | POA: Diagnosis not present

## 2017-09-23 DIAGNOSIS — R296 Repeated falls: Secondary | ICD-10-CM | POA: Diagnosis not present

## 2017-09-24 DIAGNOSIS — Z9181 History of falling: Secondary | ICD-10-CM | POA: Diagnosis not present

## 2017-09-24 DIAGNOSIS — M6281 Muscle weakness (generalized): Secondary | ICD-10-CM | POA: Diagnosis not present

## 2017-09-24 DIAGNOSIS — R296 Repeated falls: Secondary | ICD-10-CM | POA: Diagnosis not present

## 2017-09-24 DIAGNOSIS — S52025D Nondisplaced fracture of olecranon process without intraarticular extension of left ulna, subsequent encounter for closed fracture with routine healing: Secondary | ICD-10-CM | POA: Diagnosis not present

## 2017-09-24 DIAGNOSIS — R2681 Unsteadiness on feet: Secondary | ICD-10-CM | POA: Diagnosis not present

## 2017-09-24 DIAGNOSIS — R278 Other lack of coordination: Secondary | ICD-10-CM | POA: Diagnosis not present

## 2017-09-25 DIAGNOSIS — S52025D Nondisplaced fracture of olecranon process without intraarticular extension of left ulna, subsequent encounter for closed fracture with routine healing: Secondary | ICD-10-CM | POA: Diagnosis not present

## 2017-09-25 DIAGNOSIS — R278 Other lack of coordination: Secondary | ICD-10-CM | POA: Diagnosis not present

## 2017-09-25 DIAGNOSIS — Z9181 History of falling: Secondary | ICD-10-CM | POA: Diagnosis not present

## 2017-09-25 DIAGNOSIS — M6281 Muscle weakness (generalized): Secondary | ICD-10-CM | POA: Diagnosis not present

## 2017-09-25 DIAGNOSIS — R2681 Unsteadiness on feet: Secondary | ICD-10-CM | POA: Diagnosis not present

## 2017-09-25 DIAGNOSIS — R296 Repeated falls: Secondary | ICD-10-CM | POA: Diagnosis not present

## 2017-09-26 DIAGNOSIS — R2681 Unsteadiness on feet: Secondary | ICD-10-CM | POA: Diagnosis not present

## 2017-09-26 DIAGNOSIS — S52025D Nondisplaced fracture of olecranon process without intraarticular extension of left ulna, subsequent encounter for closed fracture with routine healing: Secondary | ICD-10-CM | POA: Diagnosis not present

## 2017-09-26 DIAGNOSIS — Z9181 History of falling: Secondary | ICD-10-CM | POA: Diagnosis not present

## 2017-09-26 DIAGNOSIS — R278 Other lack of coordination: Secondary | ICD-10-CM | POA: Diagnosis not present

## 2017-09-26 DIAGNOSIS — M6281 Muscle weakness (generalized): Secondary | ICD-10-CM | POA: Diagnosis not present

## 2017-09-26 DIAGNOSIS — R296 Repeated falls: Secondary | ICD-10-CM | POA: Diagnosis not present

## 2017-09-29 DIAGNOSIS — S52025D Nondisplaced fracture of olecranon process without intraarticular extension of left ulna, subsequent encounter for closed fracture with routine healing: Secondary | ICD-10-CM | POA: Diagnosis not present

## 2017-09-29 DIAGNOSIS — R2681 Unsteadiness on feet: Secondary | ICD-10-CM | POA: Diagnosis not present

## 2017-09-29 DIAGNOSIS — R278 Other lack of coordination: Secondary | ICD-10-CM | POA: Diagnosis not present

## 2017-09-29 DIAGNOSIS — Z9181 History of falling: Secondary | ICD-10-CM | POA: Diagnosis not present

## 2017-09-29 DIAGNOSIS — R296 Repeated falls: Secondary | ICD-10-CM | POA: Diagnosis not present

## 2017-09-29 DIAGNOSIS — M6281 Muscle weakness (generalized): Secondary | ICD-10-CM | POA: Diagnosis not present

## 2017-09-30 ENCOUNTER — Emergency Department (HOSPITAL_COMMUNITY): Payer: Medicare Other

## 2017-09-30 ENCOUNTER — Emergency Department (HOSPITAL_COMMUNITY)
Admission: EM | Admit: 2017-09-30 | Discharge: 2017-09-30 | Disposition: A | Discharge status: Home / Self Care | Payer: Medicare Other | Attending: Emergency Medicine | Admitting: Emergency Medicine

## 2017-09-30 DIAGNOSIS — G8911 Acute pain due to trauma: Secondary | ICD-10-CM | POA: Diagnosis not present

## 2017-09-30 DIAGNOSIS — Y939 Activity, unspecified: Secondary | ICD-10-CM | POA: Insufficient documentation

## 2017-09-30 DIAGNOSIS — W01198A Fall on same level from slipping, tripping and stumbling with subsequent striking against other object, initial encounter: Secondary | ICD-10-CM | POA: Insufficient documentation

## 2017-09-30 DIAGNOSIS — Z79899 Other long term (current) drug therapy: Secondary | ICD-10-CM | POA: Insufficient documentation

## 2017-09-30 DIAGNOSIS — Y92129 Unspecified place in nursing home as the place of occurrence of the external cause: Secondary | ICD-10-CM | POA: Diagnosis not present

## 2017-09-30 DIAGNOSIS — S0450XA Injury of facial nerve, unspecified side, initial encounter: Secondary | ICD-10-CM | POA: Diagnosis not present

## 2017-09-30 DIAGNOSIS — Z87891 Personal history of nicotine dependence: Secondary | ICD-10-CM | POA: Insufficient documentation

## 2017-09-30 DIAGNOSIS — S0181XA Laceration without foreign body of other part of head, initial encounter: Secondary | ICD-10-CM | POA: Insufficient documentation

## 2017-09-30 DIAGNOSIS — S0990XA Unspecified injury of head, initial encounter: Secondary | ICD-10-CM

## 2017-09-30 DIAGNOSIS — S199XXA Unspecified injury of neck, initial encounter: Secondary | ICD-10-CM | POA: Diagnosis not present

## 2017-09-30 DIAGNOSIS — S0993XA Unspecified injury of face, initial encounter: Secondary | ICD-10-CM | POA: Diagnosis not present

## 2017-09-30 DIAGNOSIS — S098XXA Other specified injuries of head, initial encounter: Secondary | ICD-10-CM | POA: Diagnosis present

## 2017-09-30 DIAGNOSIS — Y999 Unspecified external cause status: Secondary | ICD-10-CM | POA: Diagnosis not present

## 2017-09-30 DIAGNOSIS — Z96653 Presence of artificial knee joint, bilateral: Secondary | ICD-10-CM | POA: Diagnosis not present

## 2017-09-30 DIAGNOSIS — Z7982 Long term (current) use of aspirin: Secondary | ICD-10-CM | POA: Insufficient documentation

## 2017-09-30 DIAGNOSIS — I10 Essential (primary) hypertension: Secondary | ICD-10-CM | POA: Diagnosis not present

## 2017-09-30 DIAGNOSIS — G4489 Other headache syndrome: Secondary | ICD-10-CM | POA: Diagnosis not present

## 2017-09-30 MED ORDER — LIDOCAINE HCL 2 % IJ SOLN
20.0000 mL | Freq: Once | INTRAMUSCULAR | Status: AC
  Administered 2017-09-30: 400 mg
  Filled 2017-09-30: qty 20

## 2017-09-30 NOTE — ED Provider Notes (Signed)
[facility] DEPT Provider Note   CSN: [id number] Arrival date & time: 09/30/17  8546     History   Chief Complaint Chief Complaint  Patient presents with  . Fall    HPI Kristin Solomon is a 84 y.o. female.  HPI  Patient presents to the emergency department with complaints of fall and closed head injury.  No loss consciousness.  She presents with a laceration to her right forehead/periorbital region.  Denies nausea or vomiting.  Denies significant headache.  Denies neck pain.  No chest pain or shortness of breath at this time.  Denies abdominal pain.  Full range of motion of major joints.      Past Medical History:  Diagnosis Date  . Anemia, iron deficiency    takes Ferrous Sulfate daily  . Depression    takes Effexor daily  . Gait disorder 04/21/2014  . GERD (gastroesophageal reflux disease)    takes Omeprazole daily  . Herpes ocular    history of-takes Acyclovir daily  . High cholesterol    takes Atorvastatin daily  . History of hiatal hernia   . Hypertension    takes Metoprolol daily  . Insomnia    takes Xanax nightly  . Osteoarthritis of knee    bilateral  . Sciatic pain     Patient Active Problem List   Diagnosis Date Noted  . Closed fracture of left olecranon process 09/02/2017  . Oropharyngeal dysphagia 09/02/2017  . Advance care planning 09/02/2017  . Chronic bronchitis ([place]) 09/02/2017  . Olecranon fracture 08/28/2017  . Metacarpal bone fracture 08/28/2017  . Hyponatremia 08/22/2017  . Lethargy 08/22/2017  . T12 compression fracture ([place]) 05/06/2015  . Wedge compression fracture of T11 vertebra ([place]) 04/10/2015  . Left hip pain 07/13/2014  . GERD (gastroesophageal reflux disease) 07/13/2014  . Dry mouth 07/13/2014  . 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 Gait disorder 04/21/2014  . Constipation 06/21/2013  . Depression with anxiety 06/21/2013  . Diverticulosis of colon without hemorrhage 06/13/2013  .  S/P Nissen fundoplication (without gastrostomy tube) procedure 06/13/2013  . Lactose intolerance 06/13/2013  . PVCs (premature ventricular contractions) 06/13/2013  . Fracture of femoral neck, left ([place]) 06/12/2013  . Hip fracture requiring operative repair ([place]) 06/12/2013  . Closed left hip fracture ([place]) 06/12/2013  . Leukocytosis, unspecified 06/12/2013  . Postoperative anemia due to acute blood loss 12/25/2011  . Lumbar radiculopathy, chronic 12/23/2011  . Hyperlipidemia   . Hypertension   . Tachycardia   . Herpes ocular   . Iron deficiency anemia   . Right knee DJD   . Status post total knee replacement     Past Surgical History:  Procedure Laterality Date  . ABDOMINAL HYSTERECTOMY  1995   partial  . CARPAL TUNNEL RELEASE Right 07/21/2007  . CARPAL TUNNEL RELEASE Left 09/24/2007  . COLONOSCOPY    . DESCEMETS STRIPPING AUTOMATED ENDOTHELIAL KERATOPLASTY Left 03/14/2011  . DESCEMETS STRIPPING AUTOMATED ENDOTHELIAL KERATOPLASTY Right 08/20/2012  . EYE SURGERY Bilateral    cataract surgery  . EYE SURGERY Left    corneal transplant  . HERNIA REPAIR    . HIP ARTHROPLASTY Left 06/13/2013   Procedure: ARTHROPLASTY BIPOLAR HIP; Injection left shoulder;  Surgeon: Johnny Bridge, MD;  Location: [facility];  Service: Orthopedics;  Laterality: Left;  . JOINT REPLACEMENT     bilateral knees, left knee  . KNEE ARTHROSCOPY Right 05/11/2001  . KYPHOPLASTY N/A 04/10/2015   Procedure: T11 Kyphoplasty;  Surgeon: Jovita Gamma,  MD;  Location: [facility];  Service: Neurosurgery;  Laterality: N/A;  T11 Kyphoplasty  . KYPHOPLASTY N/A 05/06/2015   Procedure: KYPHOPLASTY Thoracic twelve;  Surgeon: Jovita Gamma, MD;  Location: [facility];  Service: Neurosurgery;  Laterality: N/A;  T12 Kyphoplasty  . LAPAROSCOPIC NISSEN FUNDOPLICATION  0/93/2355  . LUMBAR LAMINECTOMY/DECOMPRESSION MICRODISCECTOMY  08/29/2010   L2-S1  . SHOULDER ARTHROSCOPY WITH ROTATOR CUFF REPAIR AND SUBACROMIAL DECOMPRESSION  Right 01/01/2000  . TONSILLECTOMY     as child  . TOTAL HIP REVISION Left 07/08/2014   Procedure: TOTAL HIP REVISION;  Surgeon: Kerin Salen, MD;  Location: [facility];  Service: Orthopedics;  Laterality: Left;  . TOTAL KNEE ARTHROPLASTY Left 05/14/2004  . TOTAL KNEE ARTHROPLASTY  12/23/2011   Procedure: TOTAL KNEE ARTHROPLASTY;  Surgeon: Lorn Junes, MD;  Location: [facility];  Service: Orthopedics;  Laterality: Right;  DR Forreston THIS CASE  . TRIGGER FINGER RELEASE Right 07/21/2007   thumb  . TRIGGER FINGER RELEASE Left 09/24/2007   middle finger  . TRIGGER FINGER RELEASE Right 03/10/2008   ring and little fingers  . TRIGGER FINGER RELEASE Right 09/02/2012   Procedure: RELEASE TRIGGER FINGER/A-1 PULLEY RIGHT INDEX FINGER;  Surgeon: Wynonia Sours, MD;  Location: [facility];  Service: Orthopedics;  Laterality: Right;  . TRIGGER FINGER RELEASE Left 12/22/2012   Procedure: RELEASE TRIGGER FINGER/A-1 PULLEY LEFT RING FINGER;  Surgeon: Wynonia Sours, MD;  Location: [facility];  Service: Orthopedics;  Laterality: Left;  Left      OB History   None      Home Medications    Prior to Admission medications   Medication Sig Start Date End Date Taking? Authorizing Provider  acetaminophen (TYLENOL) 500 MG tablet Take 500 mg by mouth every 6 (six) hours as needed for pain.   Yes [provider]  acetaminophen (TYLENOL) 500 MG tablet Take 500 mg by mouth 3 (three) times daily.    Yes [provider]  albuterol (PROVENTIL) (2.5 MG/3ML) 0.083% nebulizer solution Take 2.5 mg by nebulization every 4 (four) hours as needed for wheezing or shortness of breath.   Yes [provider]  aspirin EC 81 MG tablet Take 81 mg by mouth daily.   Yes [provider]  Cholecalciferol (VITAMIN D) 2000 units CAPS Take 2,000 Units by mouth daily.   Yes [provider]  docusate sodium (COLACE) 100 MG capsule Take 1 capsule by mouth daily as  needed for mild constipation.  06/15/13  Yes [provider]  ferrous sulfate 325 (65 FE) MG tablet Take 325 mg by mouth 2 (two) times daily. One tablet daily during the week, one tablet twice daily only on Saturday and Sunday   Yes [provider]  fluticasone furoate-vilanterol (BREO ELLIPTA) 100-25 MCG/INH AEPB Inhale 1 puff into the lungs daily.   Yes [provider]  hypromellose (GENTEAL SEVERE) 0.3 % GEL ophthalmic ointment Place 1 application into both eyes at bedtime as needed for dry eyes.   Yes [provider]  loratadine (CLARITIN) 10 MG tablet Take 10 mg by mouth daily as needed for allergies.   Yes [provider]  LORazepam (ATIVAN) 0.5 MG tablet Take 0.5 mg by mouth 2 (two) times daily.    Yes [provider]  magnesium hydroxide (MILK OF MAGNESIA) 400 MG/5ML suspension Take 15 mLs by mouth at bedtime as needed for mild constipation.    Yes [provider]  metoprolol tartrate (LOPRESSOR) 25 MG tablet Take 25-50 mg by mouth 2 (two) times daily. Take 50 MG in the morning and take 25 MG at bedtime.   Yes [provider]  nitroGLYCERIN (NITROSTAT) 0.4 MG SL tablet Place 0.4 mg under the tongue every 5 (five) minutes as needed for chest pain.   Yes [provider]  omeprazole (PRILOSEC) 20 MG capsule Take 20 mg by mouth daily.    Yes [provider]  OXYGEN Inhale 2 L into the lungs as needed (If 02 stat drops below 90%).   Yes [provider]  traMADol (ULTRAM) 50 MG tablet Take 50 mg by mouth every 8 (eight) hours as needed for severe pain.    Yes [provider]  venlafaxine XR (EFFEXOR-XR) 150 MG 24 hr capsule Take 150 mg by mouth at bedtime.    Yes [provider]    Family History Family History  Problem Relation Age of Onset  . Heart attack Mother   . Parkinsonism Father   . Diabetes Brother     Social History Social History   Tobacco Use  . Smoking  status: Former Research scientist (life sciences)  . Smokeless tobacco: Never Used  . Tobacco comment: quit smoking 20-30 yr. ago  Substance Use Topics  . Alcohol use: No  . Drug use: Yes     Allergies   Morphine and related; Scopolamine; Atorvastatin; Morphine; Nsaids; Pravachol [pravastatin sodium]; Pravastatin; and Teriparatide   Review of Systems Review of Systems  All other systems reviewed and are negative.    Physical Exam Updated Vital Signs BP 133/71   Pulse (!) 112   Temp 98.6 F (37 C) (Oral)   Resp 18   SpO2 95%   Physical Exam  Constitutional: She is oriented to person, place, and time. She appears well-developed and well-nourished. No distress.  HENT:  Head: Normocephalic and atraumatic.  Eyes: EOM are normal.  Neck: Normal range of motion.  Cardiovascular: Normal rate, regular rhythm and normal heart sounds.  Pulmonary/Chest: Effort normal and breath sounds normal.  Abdominal: Soft. She exhibits no distension. There is no tenderness.  Musculoskeletal: Normal range of motion.  Neurological: She is alert and oriented to person, place, and time.  Skin: Skin is warm and dry.  Psychiatric: She has a normal mood and affect. Judgment normal.  Nursing note and vitals reviewed.    ED Treatments / Results  Labs (all labs ordered are listed, but only abnormal results are displayed) Labs Reviewed - No data to display  EKG None  Radiology Ct Head Wo Contrast  Result Date: 09/30/2017 CLINICAL DATA:  84 year old female post fall. Laceration forehead and bruise right cheekbone. Initial encounter. EXAM: CT HEAD WITHOUT CONTRAST CT MAXILLOFACIAL WITHOUT CONTRAST CT CERVICAL SPINE WITHOUT CONTRAST TECHNIQUE: Multidetector CT imaging of the head, cervical spine, and maxillofacial structures were performed using the standard protocol without intravenous contrast. Multiplanar CT image reconstructions of the cervical spine and maxillofacial structures were also generated. COMPARISON:  08/17/2017  head CT. 08/03/2017 head CT and cervical spine CT. FINDINGS: CT HEAD FINDINGS Brain: No intracranial hemorrhage or CT evidence of large acute infarct. Chronic microvascular changes. Atrophy. No intracranial mass lesion noted on this unenhanced exam. Vascular: Vascular calcifications Skull: No skull fracture. Other: Mild soft tissue swelling right cheek. CT MAXILLOFACIAL FINDINGS Osseous: Nasal bones tilted to the left new from prior CTs consistent with acute nasal bone fractures. Motion degradation limits evaluation of the mandible. If there were mandibular pain, repeat imaging may be  considered. Orbits: Orbital structures appear intact. Sinuses: Paranasal sinuses are clear. Soft tissues: Soft tissue swelling right cheek. CT CERVICAL SPINE FINDINGS Alignment: Alignment similar to recent exams. Skull base and vertebrae: No cervical spine fracture. Soft tissues and spinal canal: No abnormal prevertebral soft tissue swelling. Disc levels: Multilevel cervical spondylotic changes most prominent C4-5 and C5-6. Transverse ligament hypertrophy. Upper chest: Scarring lung apices. Other: Bilateral carotid bifurcation calcifications. IMPRESSION: CT HEAD No skull fracture or intracranial hemorrhage. Chronic microvascular changes. Atrophy. CT MAXILLOFACIAL Nasal bones tilted to the left new from prior CTs consistent with acute nasal bone fractures. Motion degradation limits evaluation of the mandible. If there were mandibular pain, repeat imaging may be considered. Soft tissue swelling right cheek. Orbital structures appear intact. CT CERVICAL SPINE Alignment similar to recent exams. No cervical spine fracture. No abnormal prevertebral soft tissue swelling. Multilevel cervical spondylotic changes most prominent C4-5 and C5-6. Transverse ligament hypertrophy. Electronically Signed   By: Genia Del M.D.   On: 09/30/2017 10:32   Ct Cervical Spine Wo Contrast  Result Date: 09/30/2017 CLINICAL DATA:  84 year old female post  fall. Laceration forehead and bruise right cheekbone. Initial encounter. EXAM: CT HEAD WITHOUT CONTRAST CT MAXILLOFACIAL WITHOUT CONTRAST CT CERVICAL SPINE WITHOUT CONTRAST TECHNIQUE: Multidetector CT imaging of the head, cervical spine, and maxillofacial structures were performed using the standard protocol without intravenous contrast. Multiplanar CT image reconstructions of the cervical spine and maxillofacial structures were also generated. COMPARISON:  08/17/2017 head CT. 08/03/2017 head CT and cervical spine CT. FINDINGS: CT HEAD FINDINGS Brain: No intracranial hemorrhage or CT evidence of large acute infarct. Chronic microvascular changes. Atrophy. No intracranial mass lesion noted on this unenhanced exam. Vascular: Vascular calcifications Skull: No skull fracture. Other: Mild soft tissue swelling right cheek. CT MAXILLOFACIAL FINDINGS Osseous: Nasal bones tilted to the left new from prior CTs consistent with acute nasal bone fractures. Motion degradation limits evaluation of the mandible. If there were mandibular pain, repeat imaging may be considered. Orbits: Orbital structures appear intact. Sinuses: Paranasal sinuses are clear. Soft tissues: Soft tissue swelling right cheek. CT CERVICAL SPINE FINDINGS Alignment: Alignment similar to recent exams. Skull base and vertebrae: No cervical spine fracture. Soft tissues and spinal canal: No abnormal prevertebral soft tissue swelling. Disc levels: Multilevel cervical spondylotic changes most prominent C4-5 and C5-6. Transverse ligament hypertrophy. Upper chest: Scarring lung apices. Other: Bilateral carotid bifurcation calcifications. IMPRESSION: CT HEAD No skull fracture or intracranial hemorrhage. Chronic microvascular changes. Atrophy. CT MAXILLOFACIAL Nasal bones tilted to the left new from prior CTs consistent with acute nasal bone fractures. Motion degradation limits evaluation of the mandible. If there were mandibular pain, repeat imaging may be considered.  Soft tissue swelling right cheek. Orbital structures appear intact. CT CERVICAL SPINE Alignment similar to recent exams. No cervical spine fracture. No abnormal prevertebral soft tissue swelling. Multilevel cervical spondylotic changes most prominent C4-5 and C5-6. Transverse ligament hypertrophy. Electronically Signed   By: Genia Del M.D.   On: 09/30/2017 10:32   Ct Maxillofacial Wo Contrast  Result Date: 09/30/2017 CLINICAL DATA:  84 year old female post fall. Laceration forehead and bruise right cheekbone. Initial encounter. EXAM: CT HEAD WITHOUT CONTRAST CT MAXILLOFACIAL WITHOUT CONTRAST CT CERVICAL SPINE WITHOUT CONTRAST TECHNIQUE: Multidetector CT imaging of the head, cervical spine, and maxillofacial structures were performed using the standard protocol without intravenous contrast. Multiplanar CT image reconstructions of the cervical spine and maxillofacial structures were also generated. COMPARISON:  08/17/2017 head CT. 08/03/2017 head CT and cervical spine CT. FINDINGS: CT HEAD  FINDINGS Brain: No intracranial hemorrhage or CT evidence of large acute infarct. Chronic microvascular changes. Atrophy. No intracranial mass lesion noted on this unenhanced exam. Vascular: Vascular calcifications Skull: No skull fracture. Other: Mild soft tissue swelling right cheek. CT MAXILLOFACIAL FINDINGS Osseous: Nasal bones tilted to the left new from prior CTs consistent with acute nasal bone fractures. Motion degradation limits evaluation of the mandible. If there were mandibular pain, repeat imaging may be considered. Orbits: Orbital structures appear intact. Sinuses: Paranasal sinuses are clear. Soft tissues: Soft tissue swelling right cheek. CT CERVICAL SPINE FINDINGS Alignment: Alignment similar to recent exams. Skull base and vertebrae: No cervical spine fracture. Soft tissues and spinal canal: No abnormal prevertebral soft tissue swelling. Disc levels: Multilevel cervical spondylotic changes most prominent  C4-5 and C5-6. Transverse ligament hypertrophy. Upper chest: Scarring lung apices. Other: Bilateral carotid bifurcation calcifications. IMPRESSION: CT HEAD No skull fracture or intracranial hemorrhage. Chronic microvascular changes. Atrophy. CT MAXILLOFACIAL Nasal bones tilted to the left new from prior CTs consistent with acute nasal bone fractures. Motion degradation limits evaluation of the mandible. If there were mandibular pain, repeat imaging may be considered. Soft tissue swelling right cheek. Orbital structures appear intact. CT CERVICAL SPINE Alignment similar to recent exams. No cervical spine fracture. No abnormal prevertebral soft tissue swelling. Multilevel cervical spondylotic changes most prominent C4-5 and C5-6. Transverse ligament hypertrophy. Electronically Signed   By: Genia Del M.D.   On: 09/30/2017 10:32    Procedures .Marland KitchenLaceration Repair Performed by: Jola Schmidt, MD Authorized by: Jola Schmidt, MD     LACERATION REPAIR Performed by: Jola Schmidt Consent: Verbal consent obtained. Risks and benefits: risks, benefits and alternatives were discussed Patient identity confirmed: provided demographic data Time out performed prior to procedure Prepped and Draped in normal sterile fashion Wound explored Laceration Location: Right periorbital region and eyebrow region Laceration Length: 4 cm No Foreign Bodies seen or palpated Anesthesia: local infiltration Local anesthetic: lidocaine 2 % without epinephrine Anesthetic total: 5 ml Irrigation method: syringe Amount of cleaning: standard Skin closure: 5-0 Prolene Number of sutures or staples: 6 Technique: Running interlocked Patient tolerance: Patient tolerated the procedure well with no immediate complications.   Medications Ordered in ED Medications  lidocaine (XYLOCAINE) 2 % (with pres) injection 400 mg (400 mg Infiltration Given 09/30/17 1331)     Initial Impression / Assessment and Plan / ED Course  I have  reviewed the triage vital signs and the nursing notes.  Pertinent labs & imaging results that were available during my care of the patient were reviewed by me and considered in my medical decision making (see chart for details).     Patient is overall well-appearing.  Laceration repaired.  Imaging without acute traumatic pathology.    Final Clinical Impressions(s) / ED Diagnoses   Final diagnoses:  Injury of head, initial encounter  Forehead laceration, initial encounter    ED Discharge Orders    None       Jola Schmidt, MD 09/30/17 1348

## 2017-09-30 NOTE — Discharge Instructions (Addendum)
Suture removal in 5 days (Monday Morning)

## 2017-09-30 NOTE — ED Triage Notes (Signed)
Patient arrived via [facility] from [facility]. Patient had a fall this morning. Slipped on her socks. [address] and hit her head. 1 inch laceration above right eye and swelling to right cheek bone. Nose is dripping blood. Nose does not feel broke via EMS. Only pain is on her face. Patient denies neck or back pain. No LOC. No deformities besides face. Hx. Of [place] and mentating normaly. Alert to person and event. Not oriented to time.

## 2017-09-30 NOTE — ED Notes (Addendum)
Patient is leaving via [facility] to return back to [facility]. Patient unable to sign signature pad.

## 2017-09-30 NOTE — ED Notes (Signed)
Bed: WA23 Expected date:  Expected time:  Means of arrival:  Comments: EMS-fall 

## 2017-10-01 ENCOUNTER — Encounter: Payer: Self-pay | Admitting: Nurse Practitioner

## 2017-10-01 DIAGNOSIS — S52025D Nondisplaced fracture of olecranon process without intraarticular extension of left ulna, subsequent encounter for closed fracture with routine healing: Secondary | ICD-10-CM | POA: Diagnosis not present

## 2017-10-01 DIAGNOSIS — R278 Other lack of coordination: Secondary | ICD-10-CM | POA: Diagnosis not present

## 2017-10-01 DIAGNOSIS — R296 Repeated falls: Secondary | ICD-10-CM | POA: Diagnosis not present

## 2017-10-01 DIAGNOSIS — S0181XA Laceration without foreign body of other part of head, initial encounter: Secondary | ICD-10-CM | POA: Insufficient documentation

## 2017-10-01 DIAGNOSIS — S022XXA Fracture of nasal bones, initial encounter for closed fracture: Secondary | ICD-10-CM | POA: Insufficient documentation

## 2017-10-01 DIAGNOSIS — R2681 Unsteadiness on feet: Secondary | ICD-10-CM | POA: Diagnosis not present

## 2017-10-01 DIAGNOSIS — M6281 Muscle weakness (generalized): Secondary | ICD-10-CM | POA: Diagnosis not present

## 2017-10-01 DIAGNOSIS — Z9181 History of falling: Secondary | ICD-10-CM | POA: Diagnosis not present

## 2017-10-02 ENCOUNTER — Non-Acute Institutional Stay (SKILLED_NURSING_FACILITY): Payer: Medicare Other | Admitting: Nurse Practitioner

## 2017-10-02 DIAGNOSIS — S0181XD Laceration without foreign body of other part of head, subsequent encounter: Secondary | ICD-10-CM | POA: Diagnosis not present

## 2017-10-02 DIAGNOSIS — R000 Tachycardia, unspecified: Secondary | ICD-10-CM | POA: Diagnosis not present

## 2017-10-02 DIAGNOSIS — S022XXD Fracture of nasal bones, subsequent encounter for fracture with routine healing: Secondary | ICD-10-CM

## 2017-10-02 DIAGNOSIS — S52025D Nondisplaced fracture of olecranon process without intraarticular extension of left ulna, subsequent encounter for closed fracture with routine healing: Secondary | ICD-10-CM | POA: Diagnosis not present

## 2017-10-02 DIAGNOSIS — K219 Gastro-esophageal reflux disease without esophagitis: Secondary | ICD-10-CM | POA: Diagnosis not present

## 2017-10-02 DIAGNOSIS — R269 Unspecified abnormalities of gait and mobility: Secondary | ICD-10-CM | POA: Diagnosis not present

## 2017-10-02 DIAGNOSIS — Z9181 History of falling: Secondary | ICD-10-CM | POA: Diagnosis not present

## 2017-10-02 DIAGNOSIS — R296 Repeated falls: Secondary | ICD-10-CM | POA: Diagnosis not present

## 2017-10-02 DIAGNOSIS — F418 Other specified anxiety disorders: Secondary | ICD-10-CM

## 2017-10-02 DIAGNOSIS — R278 Other lack of coordination: Secondary | ICD-10-CM | POA: Diagnosis not present

## 2017-10-02 DIAGNOSIS — R2681 Unsteadiness on feet: Secondary | ICD-10-CM | POA: Diagnosis not present

## 2017-10-02 DIAGNOSIS — I10 Essential (primary) hypertension: Secondary | ICD-10-CM

## 2017-10-02 DIAGNOSIS — W19XXXD Unspecified fall, subsequent encounter: Secondary | ICD-10-CM

## 2017-10-02 DIAGNOSIS — M6281 Muscle weakness (generalized): Secondary | ICD-10-CM | POA: Diagnosis not present

## 2017-10-02 DIAGNOSIS — W19XXXA Unspecified fall, initial encounter: Secondary | ICD-10-CM | POA: Insufficient documentation

## 2017-10-02 NOTE — Assessment & Plan Note (Signed)
09/30/17 ED 4cm length right periorbital and eyebrow laceration repair, CT head/maxillofacial cervical spine, acute nasal bone fractures. May consider ENT if not healing routinely.

## 2017-10-02 NOTE — Assessment & Plan Note (Signed)
Stable, continue Omeprazole 20mg po daily.  

## 2017-10-02 NOTE — Assessment & Plan Note (Signed)
Blood pressure is controlled on Metoprolol 50mg  qd, 25mg  qd.

## 2017-10-02 NOTE — Assessment & Plan Note (Signed)
Frequent falls, increased frailty, abnormal gait are contributory, will continue supervise the patient for safety, update MMSE

## 2017-10-02 NOTE — Assessment & Plan Note (Signed)
09/30/17 ED 4cm length right periorbital and eyebrow laceration repair, CT head/maxillofacial cervical spine, acute nasal bone fractures. Suture removal in 5 days. Observe.

## 2017-10-02 NOTE — Progress Notes (Signed)
Location:  [facility] Room Number: 42-B Place of Service:  SNF (31) Provider:  Hagen Tidd, Manxie  NP  Blanchie Serve, MD  Patient Care Team: Blanchie Serve, MD as PCP - General (Internal Medicine) [name], MD as Referring Physician (Ophthalmology)  Extended Emergency Contact Information Primary Emergency Contact: [name] Address: [address]          [place], [place] [address] [name] of [place] Phone: [phone] Mobile Phone: [phone] Relation: Son Secondary Emergency Contact: [name] Address: [address]          [place], [place] [address] [name] of [place] Phone: [phone] Mobile Phone: [phone] Relation: Son  Code Status:  DNR Goals of care: Advanced Directive information Advanced Directives 09/30/2017  Does Patient Have a Medical Advance Directive? Yes  Type of Advance Directive Living will;Out of facility DNR (pink MOST or yellow form);Healthcare Power of Attorney  Does patient want to make changes to medical advance directive? No - Patient declined  Copy of Stephen in Chart? Yes  Would patient like information on creating a medical advance directive? -  Pre-existing out of facility DNR order (yellow form or pink MOST form) Yellow form placed in chart (order not valid for inpatient use)     Chief Complaint  Patient presents with  . Medical Management of Chronic Issues    F/U-head injury,     HPI:  Pt is a 84 y.o. female seen today for medical management of chronic diseases.     The patient has frequent falls, she sustained nasal bone fracture and facial laceration from falling at [facility] 09/30/17. Suture closure right eyebrow in ED, healing nicely.    She has history of HTN, blood pressures is controlled on Metoprolol, anemia, on Fe, normal Iron, Vit B12, Folate, but elevated ferritin, last Hgb 10s. GERD stable on PPI, depression, stable, taking Venlafaxine and  Lorazepam Past Medical History:  Diagnosis Date  . Anemia, iron deficiency    takes Ferrous Sulfate daily  . Depression    takes Effexor daily  . Gait disorder 04/21/2014  . GERD (gastroesophageal reflux disease)    takes Omeprazole daily  . Herpes ocular    history of-takes Acyclovir daily  . High cholesterol    takes Atorvastatin daily  . History of hiatal hernia   . Hypertension    takes Metoprolol daily  . Insomnia    takes Xanax nightly  . Osteoarthritis of knee    bilateral  . Sciatic pain    Past Surgical History:  Procedure Laterality Date  . ABDOMINAL HYSTERECTOMY  1995   partial  . CARPAL TUNNEL RELEASE Right 07/21/2007  . CARPAL TUNNEL RELEASE Left 09/24/2007  . COLONOSCOPY    . DESCEMETS STRIPPING AUTOMATED ENDOTHELIAL KERATOPLASTY Left 03/14/2011  . DESCEMETS STRIPPING AUTOMATED ENDOTHELIAL KERATOPLASTY Right 08/20/2012  . EYE SURGERY Bilateral    cataract surgery  . EYE SURGERY Left    corneal transplant  . HERNIA REPAIR    . HIP ARTHROPLASTY Left 06/13/2013   Procedure: ARTHROPLASTY BIPOLAR HIP; Injection left shoulder;  Surgeon: Johnny Bridge, MD;  Location: [place];  Service: Orthopedics;  Laterality: Left;  . JOINT REPLACEMENT     bilateral knees, left knee  . KNEE ARTHROSCOPY Right 05/11/2001  . KYPHOPLASTY N/A 04/10/2015   Procedure: T11 Kyphoplasty;  Surgeon: Jovita Gamma, MD;  Location: [place] [facility];  Service: Neurosurgery;  Laterality: N/A;  T11 Kyphoplasty  . KYPHOPLASTY N/A 05/06/2015   Procedure:  KYPHOPLASTY Thoracic twelve;  Surgeon: Jovita Gamma, MD;  Location: [place] [facility];  Service: Neurosurgery;  Laterality: N/A;  T12 Kyphoplasty  . LAPAROSCOPIC NISSEN FUNDOPLICATION  7/89/3810  . LUMBAR LAMINECTOMY/DECOMPRESSION MICRODISCECTOMY  08/29/2010   L2-S1  . SHOULDER ARTHROSCOPY WITH ROTATOR CUFF REPAIR AND SUBACROMIAL DECOMPRESSION Right 01/01/2000  . TONSILLECTOMY     as child  . TOTAL HIP REVISION Left 07/08/2014   Procedure: TOTAL HIP  REVISION;  Surgeon: Kerin Salen, MD;  Location: [facility];  Service: Orthopedics;  Laterality: Left;  . TOTAL KNEE ARTHROPLASTY Left 05/14/2004  . TOTAL KNEE ARTHROPLASTY  12/23/2011   Procedure: TOTAL KNEE ARTHROPLASTY;  Surgeon: Lorn Junes, MD;  Location: [place];  Service: Orthopedics;  Laterality: Right;  DR [place] THIS CASE  . TRIGGER FINGER RELEASE Right 07/21/2007   thumb  . TRIGGER FINGER RELEASE Left 09/24/2007   middle finger  . TRIGGER FINGER RELEASE Right 03/10/2008   ring and little fingers  . TRIGGER FINGER RELEASE Right 09/02/2012   Procedure: RELEASE TRIGGER FINGER/A-1 PULLEY RIGHT INDEX FINGER;  Surgeon: Wynonia Sours, MD;  Location: [facility];  Service: Orthopedics;  Laterality: Right;  . TRIGGER FINGER RELEASE Left 12/22/2012   Procedure: RELEASE TRIGGER FINGER/A-1 PULLEY LEFT RING FINGER;  Surgeon: Wynonia Sours, MD;  Location: [place];  Service: Orthopedics;  Laterality: Left;  Left     Allergies  Allergen Reactions  . Morphine And Related Other (See Comments)    AGITATION, STRANGE DREAMS  . Scopolamine Other (See Comments)    MENTAL CHANGES Other reaction(s): Delusions (intolerance), Other (See Comments) MENTAL CHANGES  . Atorvastatin Other (See Comments)    On paperwork from facility Other reaction(s): Other (See Comments)  . Morphine     Other reaction(s): Confusion (intolerance), Other (See Comments) Disoriented, mood changes  . Nsaids Other (See Comments)    On paperwork from facility Other reaction(s): Other (See Comments)  . Pravachol [Pravastatin Sodium] Other (See Comments)    On paperwork from facility  . Pravastatin     Other reaction(s): Other (See Comments)  . Teriparatide     Other reaction(s): Other (See Comments)    Outpatient Encounter Medications as of 10/02/2017  Medication Sig  . acetaminophen (TYLENOL) 500 MG tablet Take 500 mg by mouth every 6 (six) hours as needed for pain.  Marland Kitchen  acetaminophen (TYLENOL) 500 MG tablet Take 500 mg by mouth 3 (three) times daily.   Marland Kitchen albuterol (PROVENTIL) (2.5 MG/3ML) 0.083% nebulizer solution Take 2.5 mg by nebulization every 4 (four) hours as needed for wheezing or shortness of breath.  Marland Kitchen aspirin EC 81 MG tablet Take 81 mg by mouth daily.  . Cholecalciferol (VITAMIN D) 2000 units CAPS Take 2,000 Units by mouth daily.  Marland Kitchen docusate sodium (COLACE) 100 MG capsule Take 1 capsule by mouth daily as needed for mild constipation.   . ferrous sulfate 325 (65 FE) MG tablet Take 325 mg by mouth 2 (two) times daily. One tablet daily during the week, one tablet twice daily only on Saturday and Sunday  . fluticasone furoate-vilanterol (BREO ELLIPTA) 100-25 MCG/INH AEPB Inhale 1 puff into the lungs daily.  . hypromellose (GENTEAL SEVERE) 0.3 % GEL ophthalmic ointment Place 1 application into both eyes at bedtime as needed for dry eyes.  Marland Kitchen loratadine (CLARITIN) 10 MG tablet Take 10 mg by mouth daily as needed for allergies.  Marland Kitchen LORazepam (ATIVAN) 0.5 MG tablet Take 0.5 mg by mouth  2 (two) times daily.   . magnesium hydroxide (MILK OF MAGNESIA) 400 MG/5ML suspension Take 15 mLs by mouth at bedtime as needed for mild constipation.   . metoprolol tartrate (LOPRESSOR) 25 MG tablet Take 25-50 mg by mouth 2 (two) times daily. Take 50 MG in the morning and take 25 MG at bedtime.  . nitroGLYCERIN (NITROSTAT) 0.4 MG SL tablet Place 0.4 mg under the tongue every 5 (five) minutes as needed for chest pain.  Marland Kitchen omeprazole (PRILOSEC) 20 MG capsule Take 20 mg by mouth daily.   . OXYGEN Inhale 2 L into the lungs as needed (If 02 stat drops below 90%).  . traMADol (ULTRAM) 50 MG tablet Take 50 mg by mouth every 8 (eight) hours as needed for severe pain.   Marland Kitchen venlafaxine XR (EFFEXOR-XR) 150 MG 24 hr capsule Take 150 mg by mouth at bedtime.    No facility-administered encounter medications on file as of 10/02/2017.     Review of Systems  Constitutional: Negative for activity  change, appetite change, chills, diaphoresis, fatigue and fever.  HENT: Positive for hearing loss. Negative for congestion and voice change.        Bridge of nose discomfort when palpated.   Eyes: Negative for visual disturbance.  Respiratory: Negative for cough, shortness of breath and wheezing.   Cardiovascular: Negative for palpitations and leg swelling.  Gastrointestinal: Negative for abdominal distention, abdominal pain, constipation, diarrhea, nausea and vomiting.  Genitourinary: Negative for dysuria and urgency.  Musculoskeletal: Positive for arthralgias and gait problem.  Skin: Positive for wound. Negative for pallor and rash.       Right eyebrow suture closure for skin laceration, right peri orbital ecchymosis  Neurological: Negative for dizziness, speech difficulty, weakness and headaches.  Psychiatric/Behavioral: Negative for agitation, behavioral problems, confusion, hallucinations and sleep disturbance. The patient is not nervous/anxious.     Immunization History  Administered Date(s) Administered  . Influenza-Unspecified 04/14/2017  . Pneumococcal Polysaccharide-23 06/14/2013   Pertinent  Health Maintenance Due  Topic Date Due  . PNA vac Low Risk Adult (2 of 2 - PCV13) 06/14/2014  . INFLUENZA VACCINE  01/22/2018  . DEXA SCAN  Completed   Fall Risk  09/19/2017  Falls in the past year? No   Functional Status Survey:    Vitals:   10/02/17 1058  BP: 136/82  Pulse: 84  Resp: 20  Temp: 97.7 F (36.5 C)  SpO2: 96%  Weight: 114 lb 11.2 oz (52 kg)  Height: 5\' 1"  (1.549 m)   Body mass index is 21.67 kg/m. Physical Exam  Constitutional: She appears well-developed and well-nourished. No distress.  HENT:  Head: Normocephalic.  Bridge of nose discomfort when palpated.   Eyes: Conjunctivae and EOM are normal.  Left pupil not round, L>R.   Neck: Normal range of motion. Neck supple. No JVD present. No thyromegaly present.  Cardiovascular: Normal rate and regular  rhythm.  No murmur heard. Pulmonary/Chest: She has no wheezes. She has no rales.  Abdominal: Soft. Bowel sounds are normal. She exhibits no distension. There is no tenderness.  Musculoskeletal: She exhibits tenderness. She exhibits no edema.  One person transfer. Right mid finger in splint  Neurological: No cranial nerve deficit. She exhibits normal muscle tone. Coordination normal.  Oriented to person  Skin: Skin is warm and dry. She is not diaphoretic.  Right eyebrow suture repair of the skin laceration is intact, no sign of infection.   Psychiatric: She has a normal mood and affect. Her behavior is normal.  Labs reviewed: Recent Labs    07/30/17 1113 08/03/17 1851 08/08/17 1135  08/26/17 09/02/17 09/04/17  NA 130* 131* 130*   < > 127* 130* 132*  K 4.3 4.4 4.5   < > 4.4 4.4 4.5  CL 98* 96* 96*  --   --   --   --   CO2 21* 26 22  --   --   --   --   GLUCOSE 94 96 91  --   --   --   --   BUN 14 17 11    < > 16 17 12   CREATININE 0.79 0.72 0.67   < > 0.7 0.5 0.5  CALCIUM 8.9 9.1 9.1  --   --   --   --    < > = values in this interval not displayed.   Recent Labs    07/30/17 1113 08/03/17 1851 08/21/17  AST 24 23 21   ALT 16 16 24   ALKPHOS 148* 142* 139*  BILITOT 0.3 0.3  --   PROT 6.6 7.5  --   ALBUMIN 3.4* 4.0  --    Recent Labs    07/30/17 1113 08/03/17 1851 08/08/17 1135 08/21/17 09/04/17 09/18/17  WBC 8.4 6.8 8.0 10.4 7.1 6.7  NEUTROABS 5.2 3.5 4.8  --   --   --   HGB 12.1 13.0 13.1 10.3* 9.8* 10.8*  HCT 35.8* 38.8 39.5 29* 28* 32*  MCV 96.5 97.5 97.8  --   --   --   PLT 325 375 318 262 575* 437*   No results found for: TSH Lab Results  Component Value Date   HGBA1C 5.8 06/29/2013   No results found for: CHOL, HDL, LDLCALC, LDLDIRECT, TRIG, CHOLHDL  Significant Diagnostic Results in last 30 days:  Ct Head Wo Contrast  Result Date: 09/30/2017 CLINICAL DATA:  84 year old female post fall. Laceration forehead and bruise right cheekbone. Initial encounter.  EXAM: CT HEAD WITHOUT CONTRAST CT MAXILLOFACIAL WITHOUT CONTRAST CT CERVICAL SPINE WITHOUT CONTRAST TECHNIQUE: Multidetector CT imaging of the head, cervical spine, and maxillofacial structures were performed using the standard protocol without intravenous contrast. Multiplanar CT image reconstructions of the cervical spine and maxillofacial structures were also generated. COMPARISON:  08/17/2017 head CT. 08/03/2017 head CT and cervical spine CT. FINDINGS: CT HEAD FINDINGS Brain: No intracranial hemorrhage or CT evidence of large acute infarct. Chronic microvascular changes. Atrophy. No intracranial mass lesion noted on this unenhanced exam. Vascular: Vascular calcifications Skull: No skull fracture. Other: Mild soft tissue swelling right cheek. CT MAXILLOFACIAL FINDINGS Osseous: Nasal bones tilted to the left new from prior CTs consistent with acute nasal bone fractures. Motion degradation limits evaluation of the mandible. If there were mandibular pain, repeat imaging may be considered. Orbits: Orbital structures appear intact. Sinuses: Paranasal sinuses are clear. Soft tissues: Soft tissue swelling right cheek. CT CERVICAL SPINE FINDINGS Alignment: Alignment similar to recent exams. Skull base and vertebrae: No cervical spine fracture. Soft tissues and spinal canal: No abnormal prevertebral soft tissue swelling. Disc levels: Multilevel cervical spondylotic changes most prominent C4-5 and C5-6. Transverse ligament hypertrophy. Upper chest: Scarring lung apices. Other: Bilateral carotid bifurcation calcifications. IMPRESSION: CT HEAD No skull fracture or intracranial hemorrhage. Chronic microvascular changes. Atrophy. CT MAXILLOFACIAL Nasal bones tilted to the left new from prior CTs consistent with acute nasal bone fractures. Motion degradation limits evaluation of the mandible. If there were mandibular pain, repeat imaging may be considered. Soft tissue swelling right cheek. Orbital structures appear intact. CT  CERVICAL SPINE Alignment similar to recent exams. No cervical spine fracture. No abnormal prevertebral soft tissue swelling. Multilevel cervical spondylotic changes most prominent C4-5 and C5-6. Transverse ligament hypertrophy. Electronically Signed   By: Genia Del M.D.   On: 09/30/2017 10:32   Ct Cervical Spine Wo Contrast  Result Date: 09/30/2017 CLINICAL DATA:  84 year old female post fall. Laceration forehead and bruise right cheekbone. Initial encounter. EXAM: CT HEAD WITHOUT CONTRAST CT MAXILLOFACIAL WITHOUT CONTRAST CT CERVICAL SPINE WITHOUT CONTRAST TECHNIQUE: Multidetector CT imaging of the head, cervical spine, and maxillofacial structures were performed using the standard protocol without intravenous contrast. Multiplanar CT image reconstructions of the cervical spine and maxillofacial structures were also generated. COMPARISON:  08/17/2017 head CT. 08/03/2017 head CT and cervical spine CT. FINDINGS: CT HEAD FINDINGS Brain: No intracranial hemorrhage or CT evidence of large acute infarct. Chronic microvascular changes. Atrophy. No intracranial mass lesion noted on this unenhanced exam. Vascular: Vascular calcifications Skull: No skull fracture. Other: Mild soft tissue swelling right cheek. CT MAXILLOFACIAL FINDINGS Osseous: Nasal bones tilted to the left new from prior CTs consistent with acute nasal bone fractures. Motion degradation limits evaluation of the mandible. If there were mandibular pain, repeat imaging may be considered. Orbits: Orbital structures appear intact. Sinuses: Paranasal sinuses are clear. Soft tissues: Soft tissue swelling right cheek. CT CERVICAL SPINE FINDINGS Alignment: Alignment similar to recent exams. Skull base and vertebrae: No cervical spine fracture. Soft tissues and spinal canal: No abnormal prevertebral soft tissue swelling. Disc levels: Multilevel cervical spondylotic changes most prominent C4-5 and C5-6. Transverse ligament hypertrophy. Upper chest: Scarring  lung apices. Other: Bilateral carotid bifurcation calcifications. IMPRESSION: CT HEAD No skull fracture or intracranial hemorrhage. Chronic microvascular changes. Atrophy. CT MAXILLOFACIAL Nasal bones tilted to the left new from prior CTs consistent with acute nasal bone fractures. Motion degradation limits evaluation of the mandible. If there were mandibular pain, repeat imaging may be considered. Soft tissue swelling right cheek. Orbital structures appear intact. CT CERVICAL SPINE Alignment similar to recent exams. No cervical spine fracture. No abnormal prevertebral soft tissue swelling. Multilevel cervical spondylotic changes most prominent C4-5 and C5-6. Transverse ligament hypertrophy. Electronically Signed   By: Genia Del M.D.   On: 09/30/2017 10:32   Ct Maxillofacial Wo Contrast  Result Date: 09/30/2017 CLINICAL DATA:  84 year old female post fall. Laceration forehead and bruise right cheekbone. Initial encounter. EXAM: CT HEAD WITHOUT CONTRAST CT MAXILLOFACIAL WITHOUT CONTRAST CT CERVICAL SPINE WITHOUT CONTRAST TECHNIQUE: Multidetector CT imaging of the head, cervical spine, and maxillofacial structures were performed using the standard protocol without intravenous contrast. Multiplanar CT image reconstructions of the cervical spine and maxillofacial structures were also generated. COMPARISON:  08/17/2017 head CT. 08/03/2017 head CT and cervical spine CT. FINDINGS: CT HEAD FINDINGS Brain: No intracranial hemorrhage or CT evidence of large acute infarct. Chronic microvascular changes. Atrophy. No intracranial mass lesion noted on this unenhanced exam. Vascular: Vascular calcifications Skull: No skull fracture. Other: Mild soft tissue swelling right cheek. CT MAXILLOFACIAL FINDINGS Osseous: Nasal bones tilted to the left new from prior CTs consistent with acute nasal bone fractures. Motion degradation limits evaluation of the mandible. If there were mandibular pain, repeat imaging may be considered.  Orbits: Orbital structures appear intact. Sinuses: Paranasal sinuses are clear. Soft tissues: Soft tissue swelling right cheek. CT CERVICAL SPINE FINDINGS Alignment: Alignment similar to recent exams. Skull base and vertebrae: No cervical spine fracture. Soft tissues and spinal canal: No abnormal prevertebral soft tissue swelling. Disc levels: Multilevel cervical spondylotic changes most  prominent C4-5 and C5-6. Transverse ligament hypertrophy. Upper chest: Scarring lung apices. Other: Bilateral carotid bifurcation calcifications. IMPRESSION: CT HEAD No skull fracture or intracranial hemorrhage. Chronic microvascular changes. Atrophy. CT MAXILLOFACIAL Nasal bones tilted to the left new from prior CTs consistent with acute nasal bone fractures. Motion degradation limits evaluation of the mandible. If there were mandibular pain, repeat imaging may be considered. Soft tissue swelling right cheek. Orbital structures appear intact. CT CERVICAL SPINE Alignment similar to recent exams. No cervical spine fracture. No abnormal prevertebral soft tissue swelling. Multilevel cervical spondylotic changes most prominent C4-5 and C5-6. Transverse ligament hypertrophy. Electronically Signed   By: Genia Del M.D.   On: 09/30/2017 10:32    Assessment/Plan Nasal bone fracture 09/30/17 ED 4cm length right periorbital and eyebrow laceration repair, CT head/maxillofacial cervical spine, acute nasal bone fractures. May consider ENT if not healing routinely.    Facial laceration 09/30/17 ED 4cm length right periorbital and eyebrow laceration repair, CT head/maxillofacial cervical spine, acute nasal bone fractures. Suture removal in 5 days. Observe.   Depression with anxiety Mood is stable, continue Valefaxine 150mg  daily. Will decrease Lorazepam 0.25mg  bid in setting of frequent falls. Observe.   Gait disorder One person transfer, wheelchair for mobility.   GERD (gastroesophageal reflux disease) Stable, continue Omeprazole  20mg  po daily.   Hypertension Blood pressure is controlled on Metoprolol 50mg  qd, 25mg  qd.   Chronic disease anemia Stable, last Hgb 10.8 09/18/17. Ferritin 764 is indicative of chronic anemia in nature, Vit B12 408, Folate 6.9. Will dc Iron, repeat CBC in one month  Fall Frequent falls, increased frailty, abnormal gait are contributory, will continue supervise the patient for safety, update MMSE  Tachycardia Chronic, the patient is asymptomatic, stated she doesn't remember when her heart rate was under 100 bpm. Continue Metoprolol, monitoring the patient.      Family/ staff Communication: plan of care reviewed with the patient and charge nurse  Labs/tests ordered:  CBC one month  Time spend 25 minutes.

## 2017-10-02 NOTE — Assessment & Plan Note (Signed)
Chronic, the patient is asymptomatic, stated she doesn't remember when her heart rate was under 100 bpm. Continue Metoprolol, monitoring the patient.

## 2017-10-02 NOTE — Assessment & Plan Note (Signed)
Stable, last Hgb 10.8 09/18/17. Ferritin 764 is indicative of chronic anemia in nature, Vit B12 408, Folate 6.9. Will dc Iron, repeat CBC in one month

## 2017-10-02 NOTE — Assessment & Plan Note (Signed)
One person transfer, wheelchair for mobility.

## 2017-10-02 NOTE — Assessment & Plan Note (Signed)
Mood is stable, continue Valefaxine 150mg  daily. Will decrease Lorazepam 0.25mg  bid in setting of frequent falls. Observe.

## 2017-10-03 DIAGNOSIS — M6281 Muscle weakness (generalized): Secondary | ICD-10-CM | POA: Diagnosis not present

## 2017-10-03 DIAGNOSIS — R2681 Unsteadiness on feet: Secondary | ICD-10-CM | POA: Diagnosis not present

## 2017-10-03 DIAGNOSIS — S52025D Nondisplaced fracture of olecranon process without intraarticular extension of left ulna, subsequent encounter for closed fracture with routine healing: Secondary | ICD-10-CM | POA: Diagnosis not present

## 2017-10-03 DIAGNOSIS — R296 Repeated falls: Secondary | ICD-10-CM | POA: Diagnosis not present

## 2017-10-03 DIAGNOSIS — Z9181 History of falling: Secondary | ICD-10-CM | POA: Diagnosis not present

## 2017-10-03 DIAGNOSIS — R278 Other lack of coordination: Secondary | ICD-10-CM | POA: Diagnosis not present

## 2017-10-05 DIAGNOSIS — R296 Repeated falls: Secondary | ICD-10-CM | POA: Diagnosis not present

## 2017-10-05 DIAGNOSIS — M6281 Muscle weakness (generalized): Secondary | ICD-10-CM | POA: Diagnosis not present

## 2017-10-05 DIAGNOSIS — S52025D Nondisplaced fracture of olecranon process without intraarticular extension of left ulna, subsequent encounter for closed fracture with routine healing: Secondary | ICD-10-CM | POA: Diagnosis not present

## 2017-10-05 DIAGNOSIS — R278 Other lack of coordination: Secondary | ICD-10-CM | POA: Diagnosis not present

## 2017-10-05 DIAGNOSIS — Z9181 History of falling: Secondary | ICD-10-CM | POA: Diagnosis not present

## 2017-10-05 DIAGNOSIS — R2681 Unsteadiness on feet: Secondary | ICD-10-CM | POA: Diagnosis not present

## 2017-10-06 ENCOUNTER — Encounter: Payer: Self-pay | Admitting: *Deleted

## 2017-10-06 ENCOUNTER — Encounter: Payer: Self-pay | Admitting: Nurse Practitioner

## 2017-10-06 ENCOUNTER — Non-Acute Institutional Stay (SKILLED_NURSING_FACILITY): Payer: Medicare Other | Admitting: Nurse Practitioner

## 2017-10-06 DIAGNOSIS — R2681 Unsteadiness on feet: Secondary | ICD-10-CM | POA: Diagnosis not present

## 2017-10-06 DIAGNOSIS — R278 Other lack of coordination: Secondary | ICD-10-CM | POA: Diagnosis not present

## 2017-10-06 DIAGNOSIS — M6281 Muscle weakness (generalized): Secondary | ICD-10-CM | POA: Diagnosis not present

## 2017-10-06 DIAGNOSIS — R350 Frequency of micturition: Secondary | ICD-10-CM | POA: Insufficient documentation

## 2017-10-06 DIAGNOSIS — R300 Dysuria: Secondary | ICD-10-CM | POA: Diagnosis not present

## 2017-10-06 DIAGNOSIS — R296 Repeated falls: Secondary | ICD-10-CM | POA: Diagnosis not present

## 2017-10-06 DIAGNOSIS — F0150 Vascular dementia without behavioral disturbance: Secondary | ICD-10-CM | POA: Diagnosis not present

## 2017-10-06 DIAGNOSIS — S52025D Nondisplaced fracture of olecranon process without intraarticular extension of left ulna, subsequent encounter for closed fracture with routine healing: Secondary | ICD-10-CM | POA: Diagnosis not present

## 2017-10-06 DIAGNOSIS — Z9181 History of falling: Secondary | ICD-10-CM | POA: Diagnosis not present

## 2017-10-06 DIAGNOSIS — N390 Urinary tract infection, site not specified: Secondary | ICD-10-CM | POA: Insufficient documentation

## 2017-10-06 NOTE — Assessment & Plan Note (Addendum)
Therapy staff reported the patient urinating 2-3x during about an hour period. UA C/S to r/o UTI

## 2017-10-06 NOTE — Assessment & Plan Note (Signed)
dysuria per staff when she was assisted to commode, urinary frequency 2-3x while working with therapy staff for 2-3 days. Obtain UA C/S CBC/diff.

## 2017-10-06 NOTE — Assessment & Plan Note (Signed)
Likely vascular dementia in setting of hx of HTN. HPI was provided with assistance of staff, the patient MMSE 17/30, passed clock drawing 09/23/17. She resides in SN[facility] for rehab since last fall and resultant of closed fracture of olecranon process of left ulna 08/17/17. May consider memory preserving meds if the patient agrees it.

## 2017-10-06 NOTE — Progress Notes (Signed)
Location:  [place] Room Number: 42-B Place of Service:  SNF (31) Provider:  Mast, Manxie  NP  Blanchie Serve, MD  Patient Care Team: Blanchie Serve, MD as PCP - General (Internal Medicine) [name], MD as Referring Physician (Ophthalmology)  Extended Emergency Contact Information Primary Emergency Contact: [name] Address: [address]          [place],  [address] [name] of [place] Phone: [phone] Mobile Phone: [phone] Relation: Son Secondary Emergency Contact: [name] Address: [address]          [place], [place] [address] [name] of [place] Phone: [phone] Mobile Phone: [phone] Relation: Son  Code Status:  DNR Goals of care: Advanced Directive information Advanced Directives 09/30/2017  Does Patient Have a Medical Advance Directive? Yes  Type of Advance Directive Living will;Out of facility DNR (pink MOST or yellow form);Healthcare Power of Attorney  Does patient want to make changes to medical advance directive? No - Patient declined  Copy of [place] in Chart? Yes  Would patient like information on creating a medical advance directive? -  Pre-existing out of facility DNR order (yellow form or pink MOST form) Yellow form placed in chart (order not valid for inpatient use)     Chief Complaint  Patient presents with  . Acute Visit    Dysuria when urinating    HPI:  Pt is a 84 y.o. female seen today for an acute visit for reported dysuria per staff when she was assisted to commode, urinary frequency 2-3x while working with therapy staff for 2-3 days. HPI was provided with assistance of staff, the patient MMSE 17/30, passed clock drawing 09/23/17. She resides in SN[facility] for rehab since last fall and resultant of closed fracture of olecranon process of left ulna 08/17/17   Past Medical History:  Diagnosis Date  . Anemia, iron deficiency    takes Ferrous  Sulfate daily  . Depression    takes Effexor daily  . Gait disorder 04/21/2014  . GERD (gastroesophageal reflux disease)    takes Omeprazole daily  . Herpes ocular    history of-takes Acyclovir daily  . High cholesterol    takes Atorvastatin daily  . History of hiatal hernia   . Hypertension    takes Metoprolol daily  . Insomnia    takes Xanax nightly  . Osteoarthritis of knee    bilateral  . Sciatic pain    Past Surgical History:  Procedure Laterality Date  . ABDOMINAL HYSTERECTOMY  1995   partial  . CARPAL TUNNEL RELEASE Right 07/21/2007  . CARPAL TUNNEL RELEASE Left 09/24/2007  . COLONOSCOPY    . DESCEMETS STRIPPING AUTOMATED ENDOTHELIAL KERATOPLASTY Left 03/14/2011  . DESCEMETS STRIPPING AUTOMATED ENDOTHELIAL KERATOPLASTY Right 08/20/2012  . EYE SURGERY Bilateral    cataract surgery  . EYE SURGERY Left    corneal transplant  . HERNIA REPAIR    . HIP ARTHROPLASTY Left 06/13/2013   Procedure: ARTHROPLASTY BIPOLAR HIP; Injection left shoulder;  Surgeon: Johnny Bridge, MD;  Location: [place];  Service: Orthopedics;  Laterality: Left;  . JOINT REPLACEMENT     bilateral knees, left knee  . KNEE ARTHROSCOPY Right 05/11/2001  . KYPHOPLASTY N/A 04/10/2015   Procedure: T11 Kyphoplasty;  Surgeon: Jovita Gamma, MD;  Location: [facility];  Service: Neurosurgery;  Laterality: N/A;  T11 Kyphoplasty  . KYPHOPLASTY N/A 05/06/2015   Procedure: KYPHOPLASTY Thoracic twelve;  Surgeon: Jovita Gamma, MD;  Location: [facility] [place]  ORS;  Service: Neurosurgery;  Laterality: N/A;  T12 Kyphoplasty  . LAPAROSCOPIC NISSEN FUNDOPLICATION  2/95/6213  . LUMBAR LAMINECTOMY/DECOMPRESSION MICRODISCECTOMY  08/29/2010   L2-S1  . SHOULDER ARTHROSCOPY WITH ROTATOR CUFF REPAIR AND SUBACROMIAL DECOMPRESSION Right 01/01/2000  . TONSILLECTOMY     as child  . TOTAL HIP REVISION Left 07/08/2014   Procedure: TOTAL HIP REVISION;  Surgeon: Kerin Salen, MD;  Location: [facility];  Service: Orthopedics;  Laterality: Left;    . TOTAL KNEE ARTHROPLASTY Left 05/14/2004  . TOTAL KNEE ARTHROPLASTY  12/23/2011   Procedure: TOTAL KNEE ARTHROPLASTY;  Surgeon: Lorn Junes, MD;  Location: [facility];  Service: Orthopedics;  Laterality: Right;  DR [place] THIS CASE  . TRIGGER FINGER RELEASE Right 07/21/2007   thumb  . TRIGGER FINGER RELEASE Left 09/24/2007   middle finger  . TRIGGER FINGER RELEASE Right 03/10/2008   ring and little fingers  . TRIGGER FINGER RELEASE Right 09/02/2012   Procedure: RELEASE TRIGGER FINGER/A-1 PULLEY RIGHT INDEX FINGER;  Surgeon: Wynonia Sours, MD;  Location: [place];  Service: Orthopedics;  Laterality: Right;  . TRIGGER FINGER RELEASE Left 12/22/2012   Procedure: RELEASE TRIGGER FINGER/A-1 PULLEY LEFT RING FINGER;  Surgeon: Wynonia Sours, MD;  Location: [facility];  Service: Orthopedics;  Laterality: Left;  Left     Allergies  Allergen Reactions  . Morphine And Related Other (See Comments)    AGITATION, STRANGE DREAMS  . Scopolamine Other (See Comments)    MENTAL CHANGES Other reaction(s): Delusions (intolerance), Other (See Comments) MENTAL CHANGES  . Atorvastatin Other (See Comments)    On paperwork from facility Other reaction(s): Other (See Comments)  . Morphine     Other reaction(s): Confusion (intolerance), Other (See Comments) Disoriented, mood changes  . Nsaids Other (See Comments)    On paperwork from facility Other reaction(s): Other (See Comments)  . Pravachol [Pravastatin Sodium] Other (See Comments)    On paperwork from facility  . Pravastatin     Other reaction(s): Other (See Comments)  . Teriparatide     Other reaction(s): Other (See Comments)    Outpatient Encounter Medications as of 10/06/2017  Medication Sig  . acetaminophen (TYLENOL) 500 MG tablet Take 500 mg by mouth every 6 (six) hours as needed for pain.  Marland Kitchen acetaminophen (TYLENOL) 500 MG tablet Take 500 mg by mouth 3 (three) times daily.   Marland Kitchen albuterol  (PROVENTIL) (2.5 MG/3ML) 0.083% nebulizer solution Take 2.5 mg by nebulization every 4 (four) hours as needed for wheezing or shortness of breath.  Marland Kitchen aspirin EC 81 MG tablet Take 81 mg by mouth daily.  Marland Kitchen docusate sodium (COLACE) 100 MG capsule Take 1 capsule by mouth daily as needed for mild constipation.   . fluticasone furoate-vilanterol (BREO ELLIPTA) 100-25 MCG/INH AEPB Inhale 1 puff into the lungs daily.  . hypromellose (GENTEAL SEVERE) 0.3 % GEL ophthalmic ointment Place 1 application into both eyes at bedtime as needed for dry eyes.  Marland Kitchen loratadine (CLARITIN) 10 MG tablet Take 10 mg by mouth daily as needed for allergies.  Marland Kitchen LORazepam (ATIVAN) 0.5 MG tablet Take 0.5 mg by mouth 2 (two) times daily.   . magnesium hydroxide (MILK OF MAGNESIA) 400 MG/5ML suspension Take 15 mLs by mouth at bedtime as needed for mild constipation.   . metoprolol tartrate (LOPRESSOR) 25 MG tablet Take 25-50 mg by mouth 2 (two) times daily. Take 50 MG in the morning and take 25 MG at bedtime.  Marland Kitchen  nitroGLYCERIN (NITROSTAT) 0.4 MG SL tablet Place 0.4 mg under the tongue every 5 (five) minutes as needed for chest pain.  Marland Kitchen omeprazole (PRILOSEC) 20 MG capsule Take 20 mg by mouth daily.   . OXYGEN Inhale 2 L into the lungs as needed (If 02 stat drops below 90%).  . traMADol (ULTRAM) 50 MG tablet Take 50 mg by mouth every 8 (eight) hours as needed for severe pain.   Marland Kitchen venlafaxine XR (EFFEXOR-XR) 150 MG 24 hr capsule Take 150 mg by mouth at bedtime.   . Cholecalciferol (VITAMIN D) 2000 units CAPS Take 2,000 Units by mouth daily.  . ferrous sulfate 325 (65 FE) MG tablet Take 325 mg by mouth 2 (two) times daily. One tablet daily during the week, one tablet twice daily only on Saturday and Sunday   No facility-administered encounter medications on file as of 10/06/2017.    ROS was provided with assistance of staff Review of Systems  Constitutional: Negative for activity change, appetite change, chills, diaphoresis, fatigue and  fever.  HENT: Positive for hearing loss. Negative for congestion, ear pain, facial swelling, sinus pressure, trouble swallowing and voice change.   Eyes: Negative for visual disturbance.  Respiratory: Negative for cough, choking, shortness of breath and wheezing.   Cardiovascular: Negative for palpitations and leg swelling.  Gastrointestinal: Negative for abdominal distention, abdominal pain, blood in stool, constipation, diarrhea, nausea and vomiting.  Genitourinary: Positive for dysuria, frequency and urgency. Negative for difficulty urinating, hematuria and vaginal discharge.  Musculoskeletal: Positive for arthralgias and gait problem.  Skin: Negative for color change and pallor.       Facial bruise, right eyebrow sutures.   Neurological: Negative for dizziness, speech difficulty, weakness and headaches.       Memory recall difficulties.   Psychiatric/Behavioral: Positive for confusion. Negative for agitation, behavioral problems, hallucinations and sleep disturbance. The patient is not nervous/anxious.     Immunization History  Administered Date(s) Administered  . Influenza-Unspecified 04/14/2017  . Pneumococcal Polysaccharide-23 06/14/2013   Pertinent  Health Maintenance Due  Topic Date Due  . PNA vac Low Risk Adult (2 of 2 - PCV13) 06/14/2014  . INFLUENZA VACCINE  01/22/2018  . DEXA SCAN  Completed   Fall Risk  09/19/2017  Falls in the past year? No   Functional Status Survey:    Vitals:   10/06/17 1049  BP: (!) 155/60  Pulse: 90  Resp: 16  Temp: 98.6 F (37 C)  SpO2: 95%  Weight: 114 lb 11.2 oz (52 kg)  Height: 5\' 1"  (1.549 m)   Body mass index is 21.67 kg/m. Physical Exam  Constitutional: She appears well-developed and well-nourished.  HENT:  Head: Normocephalic and atraumatic.  Neck: Normal range of motion. Neck supple. No JVD present. No thyromegaly present.  Cardiovascular: Normal rate and regular rhythm.  No murmur heard. Pulmonary/Chest: Effort normal.  She has no wheezes. She has no rales.  Abdominal: Soft. She exhibits no distension and no mass. There is no tenderness. There is no guarding.  Musculoskeletal: She exhibits tenderness. She exhibits no edema.  One person transfer, the right 3rd finger in splint, arthritic change to the right fingers.   Neurological: She is alert.  Oriented to self and her room on unit.   Skin: Skin is warm and dry. No rash noted.  Yellowish facial bruise present, right eyebrow sutures are in tact, no sign of infection.   Psychiatric: She has a normal mood and affect. Her behavior is normal.    Labs  reviewed: Recent Labs    07/30/17 1113 08/03/17 1851 08/08/17 1135  08/26/17 09/02/17 09/04/17  NA 130* 131* 130*   < > 127* 130* 132*  K 4.3 4.4 4.5   < > 4.4 4.4 4.5  CL 98* 96* 96*  --   --   --   --   CO2 21* 26 22  --   --   --   --   GLUCOSE 94 96 91  --   --   --   --   BUN 14 17 11    < > 16 17 12   CREATININE 0.79 0.72 0.67   < > 0.7 0.5 0.5  CALCIUM 8.9 9.1 9.1  --   --   --   --    < > = values in this interval not displayed.   Recent Labs    07/30/17 1113 08/03/17 1851 08/21/17  AST 24 23 21   ALT 16 16 24   ALKPHOS 148* 142* 139*  BILITOT 0.3 0.3  --   PROT 6.6 7.5  --   ALBUMIN 3.4* 4.0  --    Recent Labs    07/30/17 1113 08/03/17 1851 08/08/17 1135 08/21/17 09/04/17 09/18/17  WBC 8.4 6.8 8.0 10.4 7.1 6.7  NEUTROABS 5.2 3.5 4.8  --   --   --   HGB 12.1 13.0 13.1 10.3* 9.8* 10.8*  HCT 35.8* 38.8 39.5 29* 28* 32*  MCV 96.5 97.5 97.8  --   --   --   PLT 325 375 318 262 575* 437*   No results found for: TSH Lab Results  Component Value Date   HGBA1C 5.8 06/29/2013   No results found for: CHOL, HDL, LDLCALC, LDLDIRECT, TRIG, CHOLHDL  Significant Diagnostic Results in last 30 days:  Ct Head Wo Contrast  Result Date: 09/30/2017 CLINICAL DATA:  84 year old female post fall. Laceration forehead and bruise right cheekbone. Initial encounter. EXAM: CT HEAD WITHOUT CONTRAST CT  MAXILLOFACIAL WITHOUT CONTRAST CT CERVICAL SPINE WITHOUT CONTRAST TECHNIQUE: Multidetector CT imaging of the head, cervical spine, and maxillofacial structures were performed using the standard protocol without intravenous contrast. Multiplanar CT image reconstructions of the cervical spine and maxillofacial structures were also generated. COMPARISON:  08/17/2017 head CT. 08/03/2017 head CT and cervical spine CT. FINDINGS: CT HEAD FINDINGS Brain: No intracranial hemorrhage or CT evidence of large acute infarct. Chronic microvascular changes. Atrophy. No intracranial mass lesion noted on this unenhanced exam. Vascular: Vascular calcifications Skull: No skull fracture. Other: Mild soft tissue swelling right cheek. CT MAXILLOFACIAL FINDINGS Osseous: Nasal bones tilted to the left new from prior CTs consistent with acute nasal bone fractures. Motion degradation limits evaluation of the mandible. If there were mandibular pain, repeat imaging may be considered. Orbits: Orbital structures appear intact. Sinuses: Paranasal sinuses are clear. Soft tissues: Soft tissue swelling right cheek. CT CERVICAL SPINE FINDINGS Alignment: Alignment similar to recent exams. Skull base and vertebrae: No cervical spine fracture. Soft tissues and spinal canal: No abnormal prevertebral soft tissue swelling. Disc levels: Multilevel cervical spondylotic changes most prominent C4-5 and C5-6. Transverse ligament hypertrophy. Upper chest: Scarring lung apices. Other: Bilateral carotid bifurcation calcifications. IMPRESSION: CT HEAD No skull fracture or intracranial hemorrhage. Chronic microvascular changes. Atrophy. CT MAXILLOFACIAL Nasal bones tilted to the left new from prior CTs consistent with acute nasal bone fractures. Motion degradation limits evaluation of the mandible. If there were mandibular pain, repeat imaging may be considered. Soft tissue swelling right cheek. Orbital structures appear intact. CT CERVICAL SPINE  Alignment similar to  recent exams. No cervical spine fracture. No abnormal prevertebral soft tissue swelling. Multilevel cervical spondylotic changes most prominent C4-5 and C5-6. Transverse ligament hypertrophy. Electronically Signed   By: Genia Del M.D.   On: 09/30/2017 10:32   Ct Cervical Spine Wo Contrast  Result Date: 09/30/2017 CLINICAL DATA:  84 year old female post fall. Laceration forehead and bruise right cheekbone. Initial encounter. EXAM: CT HEAD WITHOUT CONTRAST CT MAXILLOFACIAL WITHOUT CONTRAST CT CERVICAL SPINE WITHOUT CONTRAST TECHNIQUE: Multidetector CT imaging of the head, cervical spine, and maxillofacial structures were performed using the standard protocol without intravenous contrast. Multiplanar CT image reconstructions of the cervical spine and maxillofacial structures were also generated. COMPARISON:  08/17/2017 head CT. 08/03/2017 head CT and cervical spine CT. FINDINGS: CT HEAD FINDINGS Brain: No intracranial hemorrhage or CT evidence of large acute infarct. Chronic microvascular changes. Atrophy. No intracranial mass lesion noted on this unenhanced exam. Vascular: Vascular calcifications Skull: No skull fracture. Other: Mild soft tissue swelling right cheek. CT MAXILLOFACIAL FINDINGS Osseous: Nasal bones tilted to the left new from prior CTs consistent with acute nasal bone fractures. Motion degradation limits evaluation of the mandible. If there were mandibular pain, repeat imaging may be considered. Orbits: Orbital structures appear intact. Sinuses: Paranasal sinuses are clear. Soft tissues: Soft tissue swelling right cheek. CT CERVICAL SPINE FINDINGS Alignment: Alignment similar to recent exams. Skull base and vertebrae: No cervical spine fracture. Soft tissues and spinal canal: No abnormal prevertebral soft tissue swelling. Disc levels: Multilevel cervical spondylotic changes most prominent C4-5 and C5-6. Transverse ligament hypertrophy. Upper chest: Scarring lung apices. Other: Bilateral carotid  bifurcation calcifications. IMPRESSION: CT HEAD No skull fracture or intracranial hemorrhage. Chronic microvascular changes. Atrophy. CT MAXILLOFACIAL Nasal bones tilted to the left new from prior CTs consistent with acute nasal bone fractures. Motion degradation limits evaluation of the mandible. If there were mandibular pain, repeat imaging may be considered. Soft tissue swelling right cheek. Orbital structures appear intact. CT CERVICAL SPINE Alignment similar to recent exams. No cervical spine fracture. No abnormal prevertebral soft tissue swelling. Multilevel cervical spondylotic changes most prominent C4-5 and C5-6. Transverse ligament hypertrophy. Electronically Signed   By: Genia Del M.D.   On: 09/30/2017 10:32   Ct Maxillofacial Wo Contrast  Result Date: 09/30/2017 CLINICAL DATA:  84 year old female post fall. Laceration forehead and bruise right cheekbone. Initial encounter. EXAM: CT HEAD WITHOUT CONTRAST CT MAXILLOFACIAL WITHOUT CONTRAST CT CERVICAL SPINE WITHOUT CONTRAST TECHNIQUE: Multidetector CT imaging of the head, cervical spine, and maxillofacial structures were performed using the standard protocol without intravenous contrast. Multiplanar CT image reconstructions of the cervical spine and maxillofacial structures were also generated. COMPARISON:  08/17/2017 head CT. 08/03/2017 head CT and cervical spine CT. FINDINGS: CT HEAD FINDINGS Brain: No intracranial hemorrhage or CT evidence of large acute infarct. Chronic microvascular changes. Atrophy. No intracranial mass lesion noted on this unenhanced exam. Vascular: Vascular calcifications Skull: No skull fracture. Other: Mild soft tissue swelling right cheek. CT MAXILLOFACIAL FINDINGS Osseous: Nasal bones tilted to the left new from prior CTs consistent with acute nasal bone fractures. Motion degradation limits evaluation of the mandible. If there were mandibular pain, repeat imaging may be considered. Orbits: Orbital structures appear  intact. Sinuses: Paranasal sinuses are clear. Soft tissues: Soft tissue swelling right cheek. CT CERVICAL SPINE FINDINGS Alignment: Alignment similar to recent exams. Skull base and vertebrae: No cervical spine fracture. Soft tissues and spinal canal: No abnormal prevertebral soft tissue swelling. Disc levels: Multilevel cervical spondylotic changes most prominent  C4-5 and C5-6. Transverse ligament hypertrophy. Upper chest: Scarring lung apices. Other: Bilateral carotid bifurcation calcifications. IMPRESSION: CT HEAD No skull fracture or intracranial hemorrhage. Chronic microvascular changes. Atrophy. CT MAXILLOFACIAL Nasal bones tilted to the left new from prior CTs consistent with acute nasal bone fractures. Motion degradation limits evaluation of the mandible. If there were mandibular pain, repeat imaging may be considered. Soft tissue swelling right cheek. Orbital structures appear intact. CT CERVICAL SPINE Alignment similar to recent exams. No cervical spine fracture. No abnormal prevertebral soft tissue swelling. Multilevel cervical spondylotic changes most prominent C4-5 and C5-6. Transverse ligament hypertrophy. Electronically Signed   By: Genia Del M.D.   On: 09/30/2017 10:32    Assessment/Plan Dysuria dysuria per staff when she was assisted to commode, urinary frequency 2-3x while working with therapy staff for 2-3 days. Obtain UA C/S CBC/diff.   Urinary frequency Therapy staff reported the patient urinating 2-3x during about an hour period. UA C/S to r/o UTI  Vascular dementia Likely vascular dementia in setting of hx of HTN. HPI was provided with assistance of staff, the patient MMSE 17/30, passed clock drawing 09/23/17. She resides in SNF [facility] for rehab since last fall and resultant of closed fracture of olecranon process of left ulna 08/17/17. May consider memory preserving meds if the patient agrees it.       Family/ staff Communication: plan of care reviewed with the patient and  charge nurse.    Labs/tests ordered:  CBC/Diff, UA C/S  Time spend 25 minutes.

## 2017-10-07 DIAGNOSIS — R509 Fever, unspecified: Secondary | ICD-10-CM | POA: Diagnosis not present

## 2017-10-07 DIAGNOSIS — Z9181 History of falling: Secondary | ICD-10-CM | POA: Diagnosis not present

## 2017-10-07 DIAGNOSIS — M6281 Muscle weakness (generalized): Secondary | ICD-10-CM | POA: Diagnosis not present

## 2017-10-07 DIAGNOSIS — R5381 Other malaise: Secondary | ICD-10-CM | POA: Diagnosis not present

## 2017-10-07 DIAGNOSIS — R296 Repeated falls: Secondary | ICD-10-CM | POA: Diagnosis not present

## 2017-10-07 DIAGNOSIS — S62302A Unspecified fracture of third metacarpal bone, right hand, initial encounter for closed fracture: Secondary | ICD-10-CM | POA: Diagnosis not present

## 2017-10-07 DIAGNOSIS — R278 Other lack of coordination: Secondary | ICD-10-CM | POA: Diagnosis not present

## 2017-10-07 DIAGNOSIS — D649 Anemia, unspecified: Secondary | ICD-10-CM | POA: Diagnosis not present

## 2017-10-07 DIAGNOSIS — S52025D Nondisplaced fracture of olecranon process without intraarticular extension of left ulna, subsequent encounter for closed fracture with routine healing: Secondary | ICD-10-CM | POA: Diagnosis not present

## 2017-10-07 DIAGNOSIS — S52025A Nondisplaced fracture of olecranon process without intraarticular extension of left ulna, initial encounter for closed fracture: Secondary | ICD-10-CM | POA: Diagnosis not present

## 2017-10-07 DIAGNOSIS — R2681 Unsteadiness on feet: Secondary | ICD-10-CM | POA: Diagnosis not present

## 2017-10-07 LAB — CBC AND DIFFERENTIAL
HEMATOCRIT: 34 — AB (ref 36–46)
HEMOGLOBIN: 12 (ref 12.0–16.0)
Platelets: 340 (ref 150–399)
WBC: 5.5

## 2017-10-08 ENCOUNTER — Other Ambulatory Visit: Payer: Self-pay | Admitting: *Deleted

## 2017-10-08 DIAGNOSIS — Z9181 History of falling: Secondary | ICD-10-CM | POA: Diagnosis not present

## 2017-10-08 DIAGNOSIS — R2681 Unsteadiness on feet: Secondary | ICD-10-CM | POA: Diagnosis not present

## 2017-10-08 DIAGNOSIS — S52025D Nondisplaced fracture of olecranon process without intraarticular extension of left ulna, subsequent encounter for closed fracture with routine healing: Secondary | ICD-10-CM | POA: Diagnosis not present

## 2017-10-08 DIAGNOSIS — M6281 Muscle weakness (generalized): Secondary | ICD-10-CM | POA: Diagnosis not present

## 2017-10-08 DIAGNOSIS — R278 Other lack of coordination: Secondary | ICD-10-CM | POA: Diagnosis not present

## 2017-10-08 DIAGNOSIS — R296 Repeated falls: Secondary | ICD-10-CM | POA: Diagnosis not present

## 2017-10-09 ENCOUNTER — Encounter: Payer: Self-pay | Admitting: Nurse Practitioner

## 2017-10-09 DIAGNOSIS — B952 Enterococcus as the cause of diseases classified elsewhere: Secondary | ICD-10-CM | POA: Insufficient documentation

## 2017-10-09 DIAGNOSIS — R278 Other lack of coordination: Secondary | ICD-10-CM | POA: Diagnosis not present

## 2017-10-09 DIAGNOSIS — R2681 Unsteadiness on feet: Secondary | ICD-10-CM | POA: Diagnosis not present

## 2017-10-09 DIAGNOSIS — N390 Urinary tract infection, site not specified: Secondary | ICD-10-CM

## 2017-10-09 DIAGNOSIS — Z9181 History of falling: Secondary | ICD-10-CM | POA: Diagnosis not present

## 2017-10-09 DIAGNOSIS — M6281 Muscle weakness (generalized): Secondary | ICD-10-CM | POA: Diagnosis not present

## 2017-10-09 DIAGNOSIS — S52025D Nondisplaced fracture of olecranon process without intraarticular extension of left ulna, subsequent encounter for closed fracture with routine healing: Secondary | ICD-10-CM | POA: Diagnosis not present

## 2017-10-09 DIAGNOSIS — R296 Repeated falls: Secondary | ICD-10-CM | POA: Diagnosis not present

## 2017-10-10 DIAGNOSIS — Z9181 History of falling: Secondary | ICD-10-CM | POA: Diagnosis not present

## 2017-10-10 DIAGNOSIS — S52025D Nondisplaced fracture of olecranon process without intraarticular extension of left ulna, subsequent encounter for closed fracture with routine healing: Secondary | ICD-10-CM | POA: Diagnosis not present

## 2017-10-10 DIAGNOSIS — R296 Repeated falls: Secondary | ICD-10-CM | POA: Diagnosis not present

## 2017-10-10 DIAGNOSIS — M6281 Muscle weakness (generalized): Secondary | ICD-10-CM | POA: Diagnosis not present

## 2017-10-10 DIAGNOSIS — R278 Other lack of coordination: Secondary | ICD-10-CM | POA: Diagnosis not present

## 2017-10-10 DIAGNOSIS — R2681 Unsteadiness on feet: Secondary | ICD-10-CM | POA: Diagnosis not present

## 2017-10-13 DIAGNOSIS — Z9181 History of falling: Secondary | ICD-10-CM | POA: Diagnosis not present

## 2017-10-13 DIAGNOSIS — R2681 Unsteadiness on feet: Secondary | ICD-10-CM | POA: Diagnosis not present

## 2017-10-13 DIAGNOSIS — M6281 Muscle weakness (generalized): Secondary | ICD-10-CM | POA: Diagnosis not present

## 2017-10-13 DIAGNOSIS — R296 Repeated falls: Secondary | ICD-10-CM | POA: Diagnosis not present

## 2017-10-13 DIAGNOSIS — S52025D Nondisplaced fracture of olecranon process without intraarticular extension of left ulna, subsequent encounter for closed fracture with routine healing: Secondary | ICD-10-CM | POA: Diagnosis not present

## 2017-10-13 DIAGNOSIS — R278 Other lack of coordination: Secondary | ICD-10-CM | POA: Diagnosis not present

## 2017-10-14 DIAGNOSIS — Z9181 History of falling: Secondary | ICD-10-CM | POA: Diagnosis not present

## 2017-10-14 DIAGNOSIS — R278 Other lack of coordination: Secondary | ICD-10-CM | POA: Diagnosis not present

## 2017-10-14 DIAGNOSIS — R2681 Unsteadiness on feet: Secondary | ICD-10-CM | POA: Diagnosis not present

## 2017-10-14 DIAGNOSIS — R296 Repeated falls: Secondary | ICD-10-CM | POA: Diagnosis not present

## 2017-10-14 DIAGNOSIS — M6281 Muscle weakness (generalized): Secondary | ICD-10-CM | POA: Diagnosis not present

## 2017-10-14 DIAGNOSIS — S52025D Nondisplaced fracture of olecranon process without intraarticular extension of left ulna, subsequent encounter for closed fracture with routine healing: Secondary | ICD-10-CM | POA: Diagnosis not present

## 2017-10-15 DIAGNOSIS — Z9181 History of falling: Secondary | ICD-10-CM | POA: Diagnosis not present

## 2017-10-15 DIAGNOSIS — M6281 Muscle weakness (generalized): Secondary | ICD-10-CM | POA: Diagnosis not present

## 2017-10-15 DIAGNOSIS — S52025D Nondisplaced fracture of olecranon process without intraarticular extension of left ulna, subsequent encounter for closed fracture with routine healing: Secondary | ICD-10-CM | POA: Diagnosis not present

## 2017-10-15 DIAGNOSIS — R296 Repeated falls: Secondary | ICD-10-CM | POA: Diagnosis not present

## 2017-10-15 DIAGNOSIS — R278 Other lack of coordination: Secondary | ICD-10-CM | POA: Diagnosis not present

## 2017-10-15 DIAGNOSIS — R2681 Unsteadiness on feet: Secondary | ICD-10-CM | POA: Diagnosis not present

## 2017-10-16 DIAGNOSIS — R2681 Unsteadiness on feet: Secondary | ICD-10-CM | POA: Diagnosis not present

## 2017-10-16 DIAGNOSIS — S52025D Nondisplaced fracture of olecranon process without intraarticular extension of left ulna, subsequent encounter for closed fracture with routine healing: Secondary | ICD-10-CM | POA: Diagnosis not present

## 2017-10-16 DIAGNOSIS — R296 Repeated falls: Secondary | ICD-10-CM | POA: Diagnosis not present

## 2017-10-16 DIAGNOSIS — Z9181 History of falling: Secondary | ICD-10-CM | POA: Diagnosis not present

## 2017-10-16 DIAGNOSIS — M6281 Muscle weakness (generalized): Secondary | ICD-10-CM | POA: Diagnosis not present

## 2017-10-16 DIAGNOSIS — R278 Other lack of coordination: Secondary | ICD-10-CM | POA: Diagnosis not present

## 2017-10-20 DIAGNOSIS — S52025D Nondisplaced fracture of olecranon process without intraarticular extension of left ulna, subsequent encounter for closed fracture with routine healing: Secondary | ICD-10-CM | POA: Diagnosis not present

## 2017-10-20 DIAGNOSIS — R296 Repeated falls: Secondary | ICD-10-CM | POA: Diagnosis not present

## 2017-10-20 DIAGNOSIS — R278 Other lack of coordination: Secondary | ICD-10-CM | POA: Diagnosis not present

## 2017-10-20 DIAGNOSIS — R2681 Unsteadiness on feet: Secondary | ICD-10-CM | POA: Diagnosis not present

## 2017-10-20 DIAGNOSIS — Z9181 History of falling: Secondary | ICD-10-CM | POA: Diagnosis not present

## 2017-10-20 DIAGNOSIS — M6281 Muscle weakness (generalized): Secondary | ICD-10-CM | POA: Diagnosis not present

## 2017-10-21 DIAGNOSIS — R278 Other lack of coordination: Secondary | ICD-10-CM | POA: Diagnosis not present

## 2017-10-21 DIAGNOSIS — R2681 Unsteadiness on feet: Secondary | ICD-10-CM | POA: Diagnosis not present

## 2017-10-21 DIAGNOSIS — Z9181 History of falling: Secondary | ICD-10-CM | POA: Diagnosis not present

## 2017-10-21 DIAGNOSIS — S52025D Nondisplaced fracture of olecranon process without intraarticular extension of left ulna, subsequent encounter for closed fracture with routine healing: Secondary | ICD-10-CM | POA: Diagnosis not present

## 2017-10-21 DIAGNOSIS — M6281 Muscle weakness (generalized): Secondary | ICD-10-CM | POA: Diagnosis not present

## 2017-10-21 DIAGNOSIS — R296 Repeated falls: Secondary | ICD-10-CM | POA: Diagnosis not present

## 2017-10-22 DIAGNOSIS — Z7389 Other problems related to life management difficulty: Secondary | ICD-10-CM | POA: Diagnosis not present

## 2017-10-22 DIAGNOSIS — R531 Weakness: Secondary | ICD-10-CM | POA: Diagnosis not present

## 2017-10-22 DIAGNOSIS — B965 Pseudomonas (aeruginosa) (mallei) (pseudomallei) as the cause of diseases classified elsewhere: Secondary | ICD-10-CM | POA: Diagnosis not present

## 2017-10-22 DIAGNOSIS — R05 Cough: Secondary | ICD-10-CM | POA: Diagnosis not present

## 2017-10-22 DIAGNOSIS — M6281 Muscle weakness (generalized): Secondary | ICD-10-CM | POA: Diagnosis not present

## 2017-10-22 DIAGNOSIS — D509 Iron deficiency anemia, unspecified: Secondary | ICD-10-CM | POA: Diagnosis not present

## 2017-10-22 DIAGNOSIS — F418 Other specified anxiety disorders: Secondary | ICD-10-CM | POA: Diagnosis not present

## 2017-10-22 DIAGNOSIS — K5900 Constipation, unspecified: Secondary | ICD-10-CM | POA: Diagnosis not present

## 2017-10-22 DIAGNOSIS — I10 Essential (primary) hypertension: Secondary | ICD-10-CM | POA: Diagnosis not present

## 2017-10-22 DIAGNOSIS — R062 Wheezing: Secondary | ICD-10-CM | POA: Diagnosis not present

## 2017-10-22 DIAGNOSIS — B0239 Other herpes zoster eye disease: Secondary | ICD-10-CM | POA: Diagnosis not present

## 2017-10-22 DIAGNOSIS — R278 Other lack of coordination: Secondary | ICD-10-CM | POA: Diagnosis not present

## 2017-10-22 DIAGNOSIS — R1313 Dysphagia, pharyngeal phase: Secondary | ICD-10-CM | POA: Diagnosis not present

## 2017-10-22 DIAGNOSIS — R41841 Cognitive communication deficit: Secondary | ICD-10-CM | POA: Diagnosis not present

## 2017-10-22 DIAGNOSIS — N3080 Other cystitis without hematuria: Secondary | ICD-10-CM | POA: Diagnosis not present

## 2017-10-22 DIAGNOSIS — Z96642 Presence of left artificial hip joint: Secondary | ICD-10-CM | POA: Diagnosis not present

## 2017-10-22 DIAGNOSIS — J45901 Unspecified asthma with (acute) exacerbation: Secondary | ICD-10-CM | POA: Diagnosis not present

## 2017-10-22 DIAGNOSIS — R5383 Other fatigue: Secondary | ICD-10-CM | POA: Diagnosis not present

## 2017-10-22 DIAGNOSIS — N390 Urinary tract infection, site not specified: Secondary | ICD-10-CM | POA: Diagnosis not present

## 2017-10-22 DIAGNOSIS — R000 Tachycardia, unspecified: Secondary | ICD-10-CM | POA: Diagnosis not present

## 2017-10-22 DIAGNOSIS — R4189 Other symptoms and signs involving cognitive functions and awareness: Secondary | ICD-10-CM | POA: Diagnosis not present

## 2017-10-22 DIAGNOSIS — R079 Chest pain, unspecified: Secondary | ICD-10-CM | POA: Diagnosis not present

## 2017-10-22 DIAGNOSIS — R2681 Unsteadiness on feet: Secondary | ICD-10-CM | POA: Diagnosis not present

## 2017-10-22 DIAGNOSIS — M2550 Pain in unspecified joint: Secondary | ICD-10-CM | POA: Diagnosis not present

## 2017-10-23 DIAGNOSIS — Z7389 Other problems related to life management difficulty: Secondary | ICD-10-CM | POA: Diagnosis not present

## 2017-10-23 DIAGNOSIS — R41841 Cognitive communication deficit: Secondary | ICD-10-CM | POA: Diagnosis not present

## 2017-10-23 DIAGNOSIS — M6281 Muscle weakness (generalized): Secondary | ICD-10-CM | POA: Diagnosis not present

## 2017-10-23 DIAGNOSIS — R1313 Dysphagia, pharyngeal phase: Secondary | ICD-10-CM | POA: Diagnosis not present

## 2017-10-23 DIAGNOSIS — R278 Other lack of coordination: Secondary | ICD-10-CM | POA: Diagnosis not present

## 2017-10-23 DIAGNOSIS — F418 Other specified anxiety disorders: Secondary | ICD-10-CM | POA: Diagnosis not present

## 2017-10-24 DIAGNOSIS — F418 Other specified anxiety disorders: Secondary | ICD-10-CM | POA: Diagnosis not present

## 2017-10-24 DIAGNOSIS — R1313 Dysphagia, pharyngeal phase: Secondary | ICD-10-CM | POA: Diagnosis not present

## 2017-10-24 DIAGNOSIS — Z7389 Other problems related to life management difficulty: Secondary | ICD-10-CM | POA: Diagnosis not present

## 2017-10-24 DIAGNOSIS — R278 Other lack of coordination: Secondary | ICD-10-CM | POA: Diagnosis not present

## 2017-10-24 DIAGNOSIS — R41841 Cognitive communication deficit: Secondary | ICD-10-CM | POA: Diagnosis not present

## 2017-10-24 DIAGNOSIS — M6281 Muscle weakness (generalized): Secondary | ICD-10-CM | POA: Diagnosis not present

## 2017-10-27 DIAGNOSIS — R278 Other lack of coordination: Secondary | ICD-10-CM | POA: Diagnosis not present

## 2017-10-27 DIAGNOSIS — R41841 Cognitive communication deficit: Secondary | ICD-10-CM | POA: Diagnosis not present

## 2017-10-27 DIAGNOSIS — Z7389 Other problems related to life management difficulty: Secondary | ICD-10-CM | POA: Diagnosis not present

## 2017-10-27 DIAGNOSIS — R1313 Dysphagia, pharyngeal phase: Secondary | ICD-10-CM | POA: Diagnosis not present

## 2017-10-27 DIAGNOSIS — F418 Other specified anxiety disorders: Secondary | ICD-10-CM | POA: Diagnosis not present

## 2017-10-27 DIAGNOSIS — M6281 Muscle weakness (generalized): Secondary | ICD-10-CM | POA: Diagnosis not present

## 2017-10-29 DIAGNOSIS — Z7389 Other problems related to life management difficulty: Secondary | ICD-10-CM | POA: Diagnosis not present

## 2017-10-29 DIAGNOSIS — R1313 Dysphagia, pharyngeal phase: Secondary | ICD-10-CM | POA: Diagnosis not present

## 2017-10-29 DIAGNOSIS — F418 Other specified anxiety disorders: Secondary | ICD-10-CM | POA: Diagnosis not present

## 2017-10-29 DIAGNOSIS — M6281 Muscle weakness (generalized): Secondary | ICD-10-CM | POA: Diagnosis not present

## 2017-10-29 DIAGNOSIS — R41841 Cognitive communication deficit: Secondary | ICD-10-CM | POA: Diagnosis not present

## 2017-10-29 DIAGNOSIS — R278 Other lack of coordination: Secondary | ICD-10-CM | POA: Diagnosis not present

## 2017-10-30 DIAGNOSIS — F418 Other specified anxiety disorders: Secondary | ICD-10-CM | POA: Diagnosis not present

## 2017-10-30 DIAGNOSIS — Z7389 Other problems related to life management difficulty: Secondary | ICD-10-CM | POA: Diagnosis not present

## 2017-10-30 DIAGNOSIS — R278 Other lack of coordination: Secondary | ICD-10-CM | POA: Diagnosis not present

## 2017-10-30 DIAGNOSIS — R1313 Dysphagia, pharyngeal phase: Secondary | ICD-10-CM | POA: Diagnosis not present

## 2017-10-30 DIAGNOSIS — D509 Iron deficiency anemia, unspecified: Secondary | ICD-10-CM | POA: Diagnosis not present

## 2017-10-30 DIAGNOSIS — M6281 Muscle weakness (generalized): Secondary | ICD-10-CM | POA: Diagnosis not present

## 2017-10-30 DIAGNOSIS — R41841 Cognitive communication deficit: Secondary | ICD-10-CM | POA: Diagnosis not present

## 2017-10-30 LAB — CBC AND DIFFERENTIAL
HEMATOCRIT: 34 — AB (ref 36–46)
Hemoglobin: 11.6 — AB (ref 12.0–16.0)
Platelets: 363 (ref 150–399)
WBC: 5.3

## 2017-10-31 ENCOUNTER — Other Ambulatory Visit: Payer: Self-pay | Admitting: *Deleted

## 2017-11-03 ENCOUNTER — Non-Acute Institutional Stay (SKILLED_NURSING_FACILITY): Payer: Medicare Other | Admitting: Nurse Practitioner

## 2017-11-03 ENCOUNTER — Encounter: Payer: Self-pay | Admitting: Nurse Practitioner

## 2017-11-03 DIAGNOSIS — R748 Abnormal levels of other serum enzymes: Secondary | ICD-10-CM

## 2017-11-03 DIAGNOSIS — K219 Gastro-esophageal reflux disease without esophagitis: Secondary | ICD-10-CM | POA: Diagnosis not present

## 2017-11-03 DIAGNOSIS — N390 Urinary tract infection, site not specified: Secondary | ICD-10-CM

## 2017-11-03 DIAGNOSIS — E871 Hypo-osmolality and hyponatremia: Secondary | ICD-10-CM | POA: Diagnosis not present

## 2017-11-03 DIAGNOSIS — F418 Other specified anxiety disorders: Secondary | ICD-10-CM | POA: Diagnosis not present

## 2017-11-03 DIAGNOSIS — I10 Essential (primary) hypertension: Secondary | ICD-10-CM

## 2017-11-03 DIAGNOSIS — B952 Enterococcus as the cause of diseases classified elsewhere: Secondary | ICD-10-CM | POA: Diagnosis not present

## 2017-11-03 DIAGNOSIS — F0150 Vascular dementia without behavioral disturbance: Secondary | ICD-10-CM

## 2017-11-03 NOTE — Assessment & Plan Note (Signed)
10/09/17 urine culture E Coli>100,000c/ml, fully treated with Nitrofurantoin 100mg  po bid x 7 days.

## 2017-11-03 NOTE — Assessment & Plan Note (Addendum)
dementia, resides in SNF [facility], frequent falls due to her lack of safety awareness and increased frailty. Last MMSE 09/23/17 17/30, will trial Namenda starter pk, then 10mg  po bid.

## 2017-11-03 NOTE — Assessment & Plan Note (Signed)
GERD stable, continue Omeprazole 20mg qd.  

## 2017-11-03 NOTE — Progress Notes (Signed)
Location:  [facility] Room Number: 42-B Place of Service:  SNF (31) Provider:  Mast, ManXie  NP  Blanchie Serve, MD  Patient Care Team: Blanchie Serve, MD as PCP - General (Internal Medicine) [name], MD as Referring Physician (Ophthalmology)  Extended Emergency Contact Information Primary Emergency Contact: [name] Address: [address]          [place], [place] [address] [name] of [place] Phone: [phone] Mobile Phone: [phone] Relation: Son Secondary Emergency Contact: [name] Address: [address]          [place], [place] [address] [name] of [place] Phone: [phone] Mobile Phone: [phone] Relation: Son  Code Status:  DNR Goals of care: Advanced Directive information Advanced Directives 11/03/2017  Does Patient Have a Medical Advance Directive? Yes  Type of Advance Directive Out of facility DNR (pink MOST or yellow form)  Does patient want to make changes to medical advance directive? No - Patient declined  Copy of [place] in Chart? -  Would patient like information on creating a medical advance directive? -  Pre-existing out of facility DNR order (yellow form or pink MOST form) Yellow form placed in chart (order not valid for inpatient use)     Chief Complaint  Patient presents with  . Medical Management of Chronic Issues    F/U- dysuria, vascular dementia    HPI:  Pt is a 84 y.o. female seen today for medical management of chronic diseases.     The patient has dementia, resides in SNF [facility], frequent falls due to her lack of safety awareness and increased frailty. She is recuperating from closed fracture of olecranon process of left ulna 08/17/17, nasal bone fracture and facial laceration 09/30/17. She is ambulating with walker with SBA. Her mood is stable on Venlafaxine 168m qd, Lorazepam 0.539mbid. GERD stable on Omeprazole 202md. HTN, controlled on  Metoprolol 8m62m, 50mg34m  Past Medical History:  Diagnosis Date  . Anemia, iron deficiency    takes Ferrous Sulfate daily  . Depression    takes Effexor daily  . Gait disorder 04/21/2014  . GERD (gastroesophageal reflux disease)    takes Omeprazole daily  . Herpes ocular    history of-takes Acyclovir daily  . High cholesterol    takes Atorvastatin daily  . History of hiatal hernia   . Hypertension    takes Metoprolol daily  . Insomnia    takes Xanax nightly  . Osteoarthritis of knee    bilateral  . Sciatic pain    Past Surgical History:  Procedure Laterality Date  . ABDOMINAL HYSTERECTOMY  1995   partial  . CARPAL TUNNEL RELEASE Right 07/21/2007  . CARPAL TUNNEL RELEASE Left 09/24/2007  . COLONOSCOPY    . DESCEMETS STRIPPING AUTOMATED ENDOTHELIAL KERATOPLASTY Left 03/14/2011  . DESCEMETS STRIPPING AUTOMATED ENDOTHELIAL KERATOPLASTY Right 08/20/2012  . EYE SURGERY Bilateral    cataract surgery  . EYE SURGERY Left    corneal transplant  . HERNIA REPAIR    . HIP ARTHROPLASTY Left 06/13/2013   Procedure: ARTHROPLASTY BIPOLAR HIP; Injection left shoulder;  Surgeon: JoshuJohnny Bridge  [facility];  Laterality: Left;  . JOINT REPLACEMENT     bilateral knees, left knee  . KNEE ARTHROSCOPY Right 05/11/2001  . KYPHOPLASTY N/A 04/10/2015   Procedure: T11 Kyphoplasty;  Surgeon: RoberJovita Gamma  [facility];  Service: Neurosurgery;  Laterality: N/A;  T11 Kyphoplasty  .  KYPHOPLASTY N/A 05/06/2015   Procedure: KYPHOPLASTY Thoracic twelve;  Surgeon: Jovita Gamma, MD;  Location: [facility];  Service: Neurosurgery;  Laterality: N/A;  T12 Kyphoplasty  . LAPAROSCOPIC NISSEN FUNDOPLICATION  02/24/4096  . LUMBAR LAMINECTOMY/DECOMPRESSION MICRODISCECTOMY  08/29/2010   L2-S1  . SHOULDER ARTHROSCOPY WITH ROTATOR CUFF REPAIR AND SUBACROMIAL DECOMPRESSION Right 01/01/2000  . TONSILLECTOMY     as child  . TOTAL HIP REVISION Left 07/08/2014    Procedure: TOTAL HIP REVISION;  Surgeon: Kerin Salen, MD;  Location: [facility];  Service: Orthopedics;  Laterality: Left;  . TOTAL KNEE ARTHROPLASTY Left 05/14/2004  . TOTAL KNEE ARTHROPLASTY  12/23/2011   Procedure: TOTAL KNEE ARTHROPLASTY;  Surgeon: Lorn Junes, MD;  Location: [facility];  Service: Orthopedics;  Laterality: Right;  DR [place] THIS CASE  . TRIGGER FINGER RELEASE Right 07/21/2007   thumb  . TRIGGER FINGER RELEASE Left 09/24/2007   middle finger  . TRIGGER FINGER RELEASE Right 03/10/2008   ring and little fingers  . TRIGGER FINGER RELEASE Right 09/02/2012   Procedure: RELEASE TRIGGER FINGER/A-1 PULLEY RIGHT INDEX FINGER;  Surgeon: Wynonia Sours, MD;  Location: [facility];  Service: Orthopedics;  Laterality: Right;  . TRIGGER FINGER RELEASE Left 12/22/2012   Procedure: RELEASE TRIGGER FINGER/A-1 PULLEY LEFT RING FINGER;  Surgeon: Wynonia Sours, MD;  Location: [facility];  Service: Orthopedics;  Laterality: Left;  Left     Allergies  Allergen Reactions  . Morphine And Related Other (See Comments)    AGITATION, STRANGE DREAMS  . Scopolamine Other (See Comments)    MENTAL CHANGES Other reaction(s): Delusions (intolerance), Other (See Comments) MENTAL CHANGES  . Atorvastatin Other (See Comments)    On paperwork from facility Other reaction(s): Other (See Comments)  . Morphine     Other reaction(s): Confusion (intolerance), Other (See Comments) Disoriented, mood changes  . Nsaids Other (See Comments)    On paperwork from facility Other reaction(s): Other (See Comments)  . Pravachol [Pravastatin Sodium] Other (See Comments)    On paperwork from facility  . Pravastatin     Other reaction(s): Other (See Comments)  . Teriparatide     Other reaction(s): Other (See Comments)    Outpatient Encounter Medications as of 11/03/2017  Medication Sig  . acetaminophen (TYLENOL) 500 MG tablet Take 500 mg by mouth every 6 (six) hours as needed  for pain.  Marland Kitchen acetaminophen (TYLENOL) 500 MG tablet Take 500 mg by mouth 3 (three) times daily.   Marland Kitchen albuterol (PROVENTIL) (2.5 MG/3ML) 0.083% nebulizer solution Take 2.5 mg by nebulization every 4 (four) hours as needed for wheezing or shortness of breath.  Marland Kitchen aspirin EC 81 MG tablet Take 81 mg by mouth daily.  . Cholecalciferol (VITAMIN D) 2000 units CAPS Take 2,000 Units by mouth daily.  Marland Kitchen docusate sodium (COLACE) 100 MG capsule Take 1 capsule by mouth daily as needed for mild constipation.   . feeding supplement (BOOST / RESOURCE BREEZE) LIQD Take 120 mLs by mouth 3 (three) times daily between meals. For weight loss  . fluticasone furoate-vilanterol (BREO ELLIPTA) 100-25 MCG/INH AEPB Inhale 1 puff into the lungs daily.  . hypromellose (GENTEAL SEVERE) 0.3 % GEL ophthalmic ointment Place 1 application into both eyes at bedtime as needed for dry eyes.  Marland Kitchen loratadine (CLARITIN) 10 MG tablet Take 10 mg by mouth daily as needed for allergies.  Marland Kitchen LORazepam (ATIVAN) 0.5 MG tablet Take 0.5 mg by mouth 2 (two) times daily.   Marland Kitchen  magnesium hydroxide (MILK OF MAGNESIA) 400 MG/5ML suspension Take 15 mLs by mouth at bedtime as needed for mild constipation.   . metoprolol tartrate (LOPRESSOR) 25 MG tablet Take 25-50 mg by mouth 2 (two) times daily. Take 50 MG in the morning and take 25 MG at bedtime.  . nitroGLYCERIN (NITROSTAT) 0.4 MG SL tablet Place 0.4 mg under the tongue every 5 (five) minutes as needed for chest pain.  Marland Kitchen omeprazole (PRILOSEC) 20 MG capsule Take 20 mg by mouth daily.   . OXYGEN Inhale 2 L into the lungs as needed (If 02 stat drops below 90%).  . traMADol (ULTRAM) 50 MG tablet Take 50 mg by mouth every 8 (eight) hours as needed for severe pain.   Marland Kitchen venlafaxine XR (EFFEXOR-XR) 150 MG 24 hr capsule Take 150 mg by mouth at bedtime.   . [DISCONTINUED] ferrous sulfate 325 (65 FE) MG tablet Take 325 mg by mouth 2 (two) times daily. One tablet daily during the week, one tablet twice daily only on  Saturday and Sunday   No facility-administered encounter medications on file as of 11/03/2017.    ROS was provided with assistance of staff Review of Systems  Constitutional: Negative for activity change, appetite change, chills, diaphoresis, fatigue and fever.  HENT: Positive for hearing loss. Negative for congestion, trouble swallowing and voice change.   Respiratory: Negative for cough, shortness of breath and wheezing.   Cardiovascular: Negative for chest pain, palpitations and leg swelling.  Gastrointestinal: Negative for abdominal distention, abdominal pain, constipation, diarrhea, nausea and vomiting.  Genitourinary: Negative for difficulty urinating, dysuria and urgency.  Musculoskeletal: Positive for gait problem.  Skin: Negative for color change and pallor.  Neurological: Negative for dizziness, speech difficulty, weakness and headaches.       Memory lapses.   Psychiatric/Behavioral: Positive for confusion. Negative for agitation, behavioral problems, hallucinations and sleep disturbance. The patient is not nervous/anxious.     Immunization History  Administered Date(s) Administered  . Influenza-Unspecified 04/14/2017  . Pneumococcal Polysaccharide-23 06/14/2013   Pertinent  Health Maintenance Due  Topic Date Due  . PNA vac Low Risk Adult (2 of 2 - PCV13) 06/14/2014  . INFLUENZA VACCINE  01/22/2018  . DEXA SCAN  Completed   Fall Risk  09/19/2017  Falls in the past year? No   Functional Status Survey:    Vitals:   11/03/17 1046  BP: 112/66  Pulse: 88  Resp: 18  Temp: (!) 97.3 F (36.3 C)  Weight: 114 lb (51.7 kg)  Height: _0  (1.549 m)   Body mass index is 21.54 kg/m. Physical Exam  Constitutional: She appears well-developed and well-nourished.  HENT:  Head: Normocephalic and atraumatic.  Eyes: EOM are normal.  Neck: Normal range of motion. Neck supple. No JVD present. No thyromegaly present.  Cardiovascular: Normal rate and regular rhythm.    Pulmonary/Chest: Effort normal and breath sounds normal. She has no wheezes. She has no rales.  Abdominal: Bowel sounds are normal. She exhibits no distension.  Musculoskeletal: She exhibits no edema.  Self transfer, ambulates with walker with SBA  Neurological: She is alert. No cranial nerve deficit. She exhibits normal muscle tone. Coordination normal.  Oriented to person and her room on unit.   Skin: Skin is warm and dry.  Psychiatric: She has a normal mood and affect. Her behavior is normal.    Labs reviewed: Recent Labs    07/30/17 1113 08/03/17 1851 08/08/17 1135  08/26/17 09/02/17 09/04/17  NA 130* 131* 130*   < >  127* 130* 132*  K 4.3 4.4 4.5   < > 4.4 4.4 4.5  CL 98* 96* 96*  --   --   --   --   CO2 21* 26 22  --   --   --   --   GLUCOSE 94 96 91  --   --   --   --   BUN _0 < > _1 CREATININE 0.79 0.72 0.67   < > 0.7 0.5 0.5  CALCIUM 8.9 9.1 9.1  --   --   --   --    < > = values in this interval not displayed.   Recent Labs    07/30/17 1113 08/03/17 1851 08/21/17  AST _2 ALT _3 ALKPHOS 148* 142* 139*  BILITOT 0.3 0.3  --   PROT 6.6 7.5  --   ALBUMIN 3.4* 4.0  --    Recent Labs    07/30/17 1113 08/03/17 1851 08/08/17 1135  09/18/17 10/07/17 10/30/17  WBC 8.4 6.8 8.0   < > 6.7 5.5 5.3  NEUTROABS 5.2 3.5 4.8  --   --   --   --   HGB 12.1 13.0 13.1   < > 10.8* 12.0 11.6*  HCT 35.8* 38.8 39.5   < > 32* 34* 34*  MCV 96.5 97.5 97.8  --   --   --   --   PLT 325 375 318   < > 437* 340 363   < > = values in this interval not displayed.   No results found for: TSH Lab Results  Component Value Date   HGBA1C 5.8 06/29/2013   No results found for: CHOL, HDL, LDLCALC, LDLDIRECT, TRIG, CHOLHDL  Significant Diagnostic Results in last 30 days:  No results found.  Assessment/Plan Vascular dementia dementia, resides in SNF [facility], frequent falls due to her lack of safety awareness and increased frailty. Last MMSE 09/23/17 17/30, will trial  Namenda starter pk, then 31m po bid.   Depression with anxiety Her mood is stable, continue Venlafaxine 1551mqd, Lorazepam 0.56m36mid.   GERD (gastroesophageal reflux disease) GERD stable, continue Omeprazole 58m44m.   Hypertension HTN, controlled, continue Metoprolol 256mg69m 50mg 44m   Hyponatremia Last Na 132 09/04/17, update CMP  Urinary tract infection due to Enterococcus 10/09/17 urine culture E Coli>100,000c/ml, fully treated with Nitrofurantoin 100mg p10md x 7 days.    Elevated serum alkaline phosphatase level Incidental findings 11/04/17 alk phos 208, 139 08/21/17, will dc Tylenol 500mg [id number] Tylenol prn, repeat CMP in 2 weeks.       Family/ staff Communication: plan of care reviewed with the patient and charge nurse.   Labs/tests ordered: CMP  Time spend 25 minutes.

## 2017-11-03 NOTE — Assessment & Plan Note (Signed)
Last Na 132 09/04/17, update CMP

## 2017-11-03 NOTE — Assessment & Plan Note (Signed)
HTN, controlled, continue Metoprolol 25mg  qd, 50mg  qd.

## 2017-11-03 NOTE — Assessment & Plan Note (Signed)
Her mood is stable, continue Venlafaxine 150mg  qd, Lorazepam 0.5mg  bid.

## 2017-11-04 ENCOUNTER — Encounter: Payer: Self-pay | Admitting: Nurse Practitioner

## 2017-11-04 DIAGNOSIS — F418 Other specified anxiety disorders: Secondary | ICD-10-CM | POA: Diagnosis not present

## 2017-11-04 DIAGNOSIS — M6281 Muscle weakness (generalized): Secondary | ICD-10-CM | POA: Diagnosis not present

## 2017-11-04 DIAGNOSIS — R41841 Cognitive communication deficit: Secondary | ICD-10-CM | POA: Diagnosis not present

## 2017-11-04 DIAGNOSIS — R748 Abnormal levels of other serum enzymes: Secondary | ICD-10-CM | POA: Insufficient documentation

## 2017-11-04 DIAGNOSIS — R1313 Dysphagia, pharyngeal phase: Secondary | ICD-10-CM | POA: Diagnosis not present

## 2017-11-04 DIAGNOSIS — E871 Hypo-osmolality and hyponatremia: Secondary | ICD-10-CM | POA: Diagnosis not present

## 2017-11-04 DIAGNOSIS — R278 Other lack of coordination: Secondary | ICD-10-CM | POA: Diagnosis not present

## 2017-11-04 DIAGNOSIS — Z7389 Other problems related to life management difficulty: Secondary | ICD-10-CM | POA: Diagnosis not present

## 2017-11-04 LAB — HEPATIC FUNCTION PANEL
ALT: 15 (ref 7–35)
AST: 20 (ref 13–35)
Alkaline Phosphatase: 207 — AB (ref 25–125)
Bilirubin, Total: 0.3

## 2017-11-04 LAB — BASIC METABOLIC PANEL
BUN: 11 (ref 4–21)
CREATININE: 0.6 (ref <=1.1)
GLUCOSE: 111
POTASSIUM: 3.9 (ref 3.4–5.3)
Sodium: 133 — AB (ref 137–147)

## 2017-11-04 NOTE — Assessment & Plan Note (Signed)
Incidental findings 11/04/17 alk phos 208, 139 08/21/17, will dc Tylenol '500mg'$  tid, continue Tylenol prn, repeat CMP in 2 weeks.

## 2017-11-05 ENCOUNTER — Other Ambulatory Visit: Payer: Self-pay | Admitting: *Deleted

## 2017-11-05 LAB — COMPREHENSIVE METABOLIC PANEL
ALBUMIN: 4.2
CALCIUM: 9.2
CHLORIDE: 98
CO2: 26
GFR CALC NON AF AMER: 82
Globulin: 2.8
PROTEIN: 7

## 2017-11-06 DIAGNOSIS — Z7389 Other problems related to life management difficulty: Secondary | ICD-10-CM | POA: Diagnosis not present

## 2017-11-06 DIAGNOSIS — M6281 Muscle weakness (generalized): Secondary | ICD-10-CM | POA: Diagnosis not present

## 2017-11-06 DIAGNOSIS — R41841 Cognitive communication deficit: Secondary | ICD-10-CM | POA: Diagnosis not present

## 2017-11-06 DIAGNOSIS — R1313 Dysphagia, pharyngeal phase: Secondary | ICD-10-CM | POA: Diagnosis not present

## 2017-11-06 DIAGNOSIS — R278 Other lack of coordination: Secondary | ICD-10-CM | POA: Diagnosis not present

## 2017-11-06 DIAGNOSIS — F418 Other specified anxiety disorders: Secondary | ICD-10-CM | POA: Diagnosis not present

## 2017-11-07 DIAGNOSIS — R278 Other lack of coordination: Secondary | ICD-10-CM | POA: Diagnosis not present

## 2017-11-07 DIAGNOSIS — R41841 Cognitive communication deficit: Secondary | ICD-10-CM | POA: Diagnosis not present

## 2017-11-07 DIAGNOSIS — R1313 Dysphagia, pharyngeal phase: Secondary | ICD-10-CM | POA: Diagnosis not present

## 2017-11-07 DIAGNOSIS — Z7389 Other problems related to life management difficulty: Secondary | ICD-10-CM | POA: Diagnosis not present

## 2017-11-07 DIAGNOSIS — F418 Other specified anxiety disorders: Secondary | ICD-10-CM | POA: Diagnosis not present

## 2017-11-07 DIAGNOSIS — M6281 Muscle weakness (generalized): Secondary | ICD-10-CM | POA: Diagnosis not present

## 2017-11-11 ENCOUNTER — Encounter: Payer: Self-pay | Admitting: Nurse Practitioner

## 2017-11-11 ENCOUNTER — Non-Acute Institutional Stay (SKILLED_NURSING_FACILITY): Payer: Medicare Other | Admitting: Nurse Practitioner

## 2017-11-11 DIAGNOSIS — R5383 Other fatigue: Secondary | ICD-10-CM | POA: Insufficient documentation

## 2017-11-11 DIAGNOSIS — F418 Other specified anxiety disorders: Secondary | ICD-10-CM | POA: Diagnosis not present

## 2017-11-11 DIAGNOSIS — F0150 Vascular dementia without behavioral disturbance: Secondary | ICD-10-CM | POA: Diagnosis not present

## 2017-11-11 DIAGNOSIS — D638 Anemia in other chronic diseases classified elsewhere: Secondary | ICD-10-CM

## 2017-11-11 DIAGNOSIS — R5382 Chronic fatigue, unspecified: Secondary | ICD-10-CM

## 2017-11-11 DIAGNOSIS — E871 Hypo-osmolality and hyponatremia: Secondary | ICD-10-CM

## 2017-11-11 NOTE — Assessment & Plan Note (Signed)
Hx of depression on Effexor/Lorazepam, hyponatremia(last Na 133), and anemia maybe contributory. Update CBC, Fe, Ferritin, CMP, and TSH

## 2017-11-11 NOTE — Assessment & Plan Note (Signed)
Last Na 133 11/04/17, update CMP

## 2017-11-11 NOTE — Assessment & Plan Note (Signed)
HPI was provided with assistance of staff, she resides in SNF [facility], continue Memantine 5mg  bid for memory.

## 2017-11-11 NOTE — Progress Notes (Signed)
Location:  [facility] Room Number: 42-B Place of Service:  SNF (31) Provider:  Taliya Mcclard, ManXie  NP  Blanchie Serve, MD  Patient Care Team: Blanchie Serve, MD as PCP - General (Internal Medicine) [name], MD as Referring Physician (Ophthalmology)  Extended Emergency Contact Information Primary Emergency Contact: [name] Address: [address]          [place], [place] [address] [name] of [place] Phone: [phone] Mobile Phone: [phone] Relation: Son Secondary Emergency Contact: [name] Address: [address]          [place], [place] [address] [name] of [place] Phone: [phone] Mobile Phone: [phone] Relation: Son  Code Status:  DNR Goals of care: Advanced Directive information Advanced Directives 11/03/2017  Does Patient Have a Medical Advance Directive? Yes  Type of Advance Directive Out of facility DNR (pink MOST or yellow form)  Does patient want to make changes to medical advance directive? No - Patient declined  Copy of [place] in Chart? -  Would patient like information on creating a medical advance directive? -  Pre-existing out of facility DNR order (yellow form or pink MOST form) Yellow form placed in chart (order not valid for inpatient use)     Chief Complaint  Patient presents with  . Acute Visit    Feeling tired, thinks she needs some Iron    HPI:  Pt is a 84 y.o. female seen today for an acute visit for the patient stated she has long standing iron deficiency anemia, always taking Fe supplement in the past but recently. Had blood transfusion in the past. Last Hgb Hgb 11,6 10/30/17. Vit B12 408, Folate 6.9 09/18/17. She denied chest pain, palpitation, chest pressure, SOB, indigestion, heartburns, abd pain, constipation, or diarrhea. She stated she sleeps and eats at her baseline. She is in her usual state of health, self transfer, ambulates with walker. The  patient has history of dementia, HPI was provided with assistance of staff, she resides in SNF [facility], on Memantine 5mg  bid for memory.    Past Medical History:  Diagnosis Date  . Anemia, iron deficiency    takes Ferrous Sulfate daily  . Depression    takes Effexor daily  . Gait disorder 04/21/2014  . GERD (gastroesophageal reflux disease)    takes Omeprazole daily  . Herpes ocular    history of-takes Acyclovir daily  . High cholesterol    takes Atorvastatin daily  . History of hiatal hernia   . Hypertension    takes Metoprolol daily  . Insomnia    takes Xanax nightly  . Osteoarthritis of knee    bilateral  . Sciatic pain    Past Surgical History:  Procedure Laterality Date  . ABDOMINAL HYSTERECTOMY  1995   partial  . CARPAL TUNNEL RELEASE Right 07/21/2007  . CARPAL TUNNEL RELEASE Left 09/24/2007  . COLONOSCOPY    . DESCEMETS STRIPPING AUTOMATED ENDOTHELIAL KERATOPLASTY Left 03/14/2011  . DESCEMETS STRIPPING AUTOMATED ENDOTHELIAL KERATOPLASTY Right 08/20/2012  . EYE SURGERY Bilateral    cataract surgery  . EYE SURGERY Left    corneal transplant  . HERNIA REPAIR    . HIP ARTHROPLASTY Left 06/13/2013   Procedure: ARTHROPLASTY BIPOLAR HIP; Injection left shoulder;  Surgeon: Johnny Bridge, MD;  Location: [facility];  Service: Orthopedics;  Laterality: Left;  . JOINT REPLACEMENT     bilateral knees, left knee  . KNEE ARTHROSCOPY Right 05/11/2001  . KYPHOPLASTY N/A 04/10/2015   Procedure: T11  Kyphoplasty;  Surgeon: Jovita Gamma, MD;  Location: [facility];  Service: Neurosurgery;  Laterality: N/A;  T11 Kyphoplasty  . KYPHOPLASTY N/A 05/06/2015   Procedure: KYPHOPLASTY Thoracic twelve;  Surgeon: Jovita Gamma, MD;  Location: [facility];  Service: Neurosurgery;  Laterality: N/A;  T12 Kyphoplasty  . LAPAROSCOPIC NISSEN FUNDOPLICATION  7/34/1937  . LUMBAR LAMINECTOMY/DECOMPRESSION MICRODISCECTOMY  08/29/2010   L2-S1  . SHOULDER ARTHROSCOPY WITH ROTATOR CUFF REPAIR AND SUBACROMIAL  DECOMPRESSION Right 01/01/2000  . TONSILLECTOMY     as child  . TOTAL HIP REVISION Left 07/08/2014   Procedure: TOTAL HIP REVISION;  Surgeon: Kerin Salen, MD;  Location: [facility];  Service: Orthopedics;  Laterality: Left;  . TOTAL KNEE ARTHROPLASTY Left 05/14/2004  . TOTAL KNEE ARTHROPLASTY  12/23/2011   Procedure: TOTAL KNEE ARTHROPLASTY;  Surgeon: Lorn Junes, MD;  Location: [facility];  Service: Orthopedics;  Laterality: Right;  [facility] THIS CASE  . TRIGGER FINGER RELEASE Right 07/21/2007   thumb  . TRIGGER FINGER RELEASE Left 09/24/2007   middle finger  . TRIGGER FINGER RELEASE Right 03/10/2008   ring and little fingers  . TRIGGER FINGER RELEASE Right 09/02/2012   Procedure: RELEASE TRIGGER FINGER/A-1 PULLEY RIGHT INDEX FINGER;  Surgeon: Wynonia Sours, MD;  Location: [facility];  Service: Orthopedics;  Laterality: Right;  . TRIGGER FINGER RELEASE Left 12/22/2012   Procedure: RELEASE TRIGGER FINGER/A-1 PULLEY LEFT RING FINGER;  Surgeon: Wynonia Sours, MD;  Location: [facility];  Service: Orthopedics;  Laterality: Left;  Left     Allergies  Allergen Reactions  . Morphine And Related Other (See Comments)    AGITATION, STRANGE DREAMS  . Scopolamine Other (See Comments)    MENTAL CHANGES Other reaction(s): Delusions (intolerance), Other (See Comments) MENTAL CHANGES  . Atorvastatin Other (See Comments)    On paperwork from facility Other reaction(s): Other (See Comments)  . Morphine     Other reaction(s): Confusion (intolerance), Other (See Comments) Disoriented, mood changes  . Nsaids Other (See Comments)    On paperwork from facility Other reaction(s): Other (See Comments)  . Pravachol [Pravastatin Sodium] Other (See Comments)    On paperwork from facility  . Pravastatin     Other reaction(s): Other (See Comments)  . Teriparatide     Other reaction(s): Other (See Comments)    Outpatient Encounter Medications as of 11/11/2017    Medication Sig  . acetaminophen (TYLENOL) 500 MG tablet Take 500 mg by mouth every 6 (six) hours as needed for pain.  Marland Kitchen albuterol (PROVENTIL) (2.5 MG/3ML) 0.083% nebulizer solution Take 2.5 mg by nebulization every 4 (four) hours as needed for wheezing or shortness of breath.  Marland Kitchen aspirin EC 81 MG tablet Take 81 mg by mouth daily.  . Cholecalciferol (VITAMIN D) 2000 units CAPS Take 2,000 Units by mouth daily.  Marland Kitchen Dextran 70-Hypromellose (ARTIFICIAL TEARS) 0.1-0.3 % SOLN Apply 1 drop to eye 4 (four) times daily.  Marland Kitchen docusate sodium (COLACE) 100 MG capsule Take 1 capsule by mouth daily as needed for mild constipation.   . feeding supplement (BOOST / RESOURCE BREEZE) LIQD Take 120 mLs by mouth 3 (three) times daily between meals. For weight loss  . fluticasone furoate-vilanterol (BREO ELLIPTA) 100-25 MCG/INH AEPB Inhale 1 puff into the lungs daily.  . hypromellose (GENTEAL SEVERE) 0.3 % GEL ophthalmic ointment Place 1 application into both eyes at bedtime as needed for dry eyes.  Marland Kitchen loratadine (CLARITIN) 10 MG tablet Take 10  mg by mouth daily as needed for allergies.  Marland Kitchen LORazepam (ATIVAN) 0.5 MG tablet Take 0.25 mg by mouth 2 (two) times daily.   . magnesium hydroxide (MILK OF MAGNESIA) 400 MG/5ML suspension Take 15 mLs by mouth at bedtime as needed for mild constipation.   . memantine (NAMENDA TITRATION PACK) tablet pack Take 5 mg by mouth 2 (two) times daily. 5 mg/day for =1 week; 5 mg twice daily for =1 week; 15 mg/day given in 5 mg and 10 mg separated doses for =1 week; then 10 mg twice daily  . metoprolol succinate (TOPROL-XL) 25 MG 24 hr tablet Take 25 mg by mouth at bedtime. Take 2 tablets by mouth each morning  . nitroGLYCERIN (NITROSTAT) 0.4 MG SL tablet Place 0.4 mg under the tongue every 5 (five) minutes as needed for chest pain.  Marland Kitchen omeprazole (PRILOSEC) 20 MG capsule Take 20 mg by mouth daily.   . OXYGEN Inhale 2 L into the lungs as needed (If 02 stat drops below 90%).  . traMADol (ULTRAM) 50  MG tablet Take 50 mg by mouth every 8 (eight) hours as needed for severe pain.   Marland Kitchen venlafaxine XR (EFFEXOR-XR) 150 MG 24 hr capsule Take 150 mg by mouth at bedtime.   . [DISCONTINUED] acetaminophen (TYLENOL) 500 MG tablet Take 500 mg by mouth 3 (three) times daily.   . [DISCONTINUED] metoprolol tartrate (LOPRESSOR) 25 MG tablet Take 25-50 mg by mouth 2 (two) times daily. Take 50 MG in the morning and take 25 MG at bedtime.   No facility-administered encounter medications on file as of 11/11/2017.    ROS was provided with assistance of staff Review of Systems  Constitutional: Positive for fatigue. Negative for activity change, appetite change, chills, diaphoresis and fever.  HENT: Positive for hearing loss. Negative for congestion.   Respiratory: Negative for cough, shortness of breath and wheezing.   Cardiovascular: Negative for chest pain, palpitations and leg swelling.  Gastrointestinal: Negative for abdominal distention, abdominal pain, blood in stool, constipation, diarrhea, nausea and vomiting.  Musculoskeletal: Positive for gait problem.  Skin: Negative for color change and pallor.  Neurological: Negative for dizziness, speech difficulty, weakness and headaches.       Dementia  Psychiatric/Behavioral: Positive for confusion. Negative for agitation, behavioral problems, hallucinations and sleep disturbance. The patient is not nervous/anxious.     Immunization History  Administered Date(s) Administered  . Influenza-Unspecified 04/14/2017  . Pneumococcal Polysaccharide-23 06/14/2013   Pertinent  Health Maintenance Due  Topic Date Due  . PNA vac Low Risk Adult (2 of 2 - PCV13) 06/14/2014  . INFLUENZA VACCINE  01/22/2018  . DEXA SCAN  Completed   Fall Risk  09/19/2017  Falls in the past year? No   Functional Status Survey:    Vitals:   11/11/17 1157  BP: 120/68  Pulse: 66  Resp: 20  Temp: 98 F (36.7 C)  Weight: 115 lb 12.8 oz (52.5 kg)  Height: 5\' 1"  (1.549 m)   Body  mass index is 21.88 kg/m. Physical Exam  Constitutional: She appears well-developed and well-nourished.  HENT:  Head: Normocephalic and atraumatic.  Neck: Normal range of motion. Neck supple. No JVD present. No thyromegaly present.  Cardiovascular: Normal rate and regular rhythm.  No murmur heard. Pulmonary/Chest: Effort normal. She has no wheezes. She has no rales.  Abdominal: Soft. Bowel sounds are normal. She exhibits no distension. There is no tenderness.  Musculoskeletal: She exhibits no edema.  Self transfer, ambulates with walker.   Neurological:  She is alert. No cranial nerve deficit. She exhibits normal muscle tone. Coordination normal.  Oriented to person and her room on unit.   Skin: Skin is warm and dry. No pallor.  Psychiatric: She has a normal mood and affect. Her behavior is normal.    Labs reviewed: Recent Labs    07/30/17 1113 08/03/17 1851 08/08/17 1135  09/02/17 09/04/17 11/04/17  NA 130* 131* 130*   < > 130* 132* 133*  K 4.3 4.4 4.5   < > 4.4 4.5 3.9  CL 98* 96* 96*  --   --   --  98  CO2 21* 26 22  --   --   --  26  GLUCOSE 94 96 91  --   --   --   --   BUN 14 17 11    < > 17 12 11   CREATININE 0.79 0.72 0.67   < > 0.5 0.5 0.6  CALCIUM 8.9 9.1 9.1  --   --   --  9.2   < > = values in this interval not displayed.   Recent Labs    07/30/17 1113 08/03/17 1851 08/21/17 11/04/17  AST 24 23 21 20   ALT 16 16 24 15   ALKPHOS 148* 142* 139* 207*  BILITOT 0.3 0.3  --   --   PROT 6.6 7.5  --   --   ALBUMIN 3.4* 4.0  --  4.2   Recent Labs    07/30/17 1113 08/03/17 1851 08/08/17 1135  09/18/17 10/07/17 10/30/17  WBC 8.4 6.8 8.0   < > 6.7 5.5 5.3  NEUTROABS 5.2 3.5 4.8  --   --   --   --   HGB 12.1 13.0 13.1   < > 10.8* 12.0 11.6*  HCT 35.8* 38.8 39.5   < > 32* 34* 34*  MCV 96.5 97.5 97.8  --   --   --   --   PLT 325 375 318   < > 437* 340 363   < > = values in this interval not displayed.   No results found for: TSH Lab Results  Component Value Date     HGBA1C 5.8 06/29/2013   No results found for: CHOL, HDL, LDLCALC, LDLDIRECT, TRIG, CHOLHDL  Significant Diagnostic Results in last 30 days:  No results found.  Assessment/Plan Chronic disease anemia Will obtain CBC, Fe, Ferritin 11/13/17, observe the patient,    Vascular dementia HPI was provided with assistance of staff, she resides in SNF [facility], continue Memantine 5mg  bid for memory.    Fatigue Hx of depression on Effexor/Lorazepam, hyponatremia(last Na 133), and anemia maybe contributory. Update CBC, Fe, Ferritin, CMP, and TSH  Depression with anxiety Mood is stable, continue Effexor and Lorazepam.   Hyponatremia Last Na 133 11/04/17, update CMP     Family/ staff Communication: plan of care reviewed with the patient and charge nurse.   Labs/tests ordered: CBC Iron Ferritin CMP TSH   Time spend 25 minutes.

## 2017-11-11 NOTE — Assessment & Plan Note (Signed)
Will obtain CBC, Fe, Ferritin 11/13/17, observe the patient,

## 2017-11-11 NOTE — Assessment & Plan Note (Signed)
Mood is stable, continue Effexor and Lorazepam.  

## 2017-11-13 ENCOUNTER — Encounter: Payer: Self-pay | Admitting: Nurse Practitioner

## 2017-11-13 DIAGNOSIS — R5383 Other fatigue: Secondary | ICD-10-CM | POA: Diagnosis not present

## 2017-11-13 DIAGNOSIS — D509 Iron deficiency anemia, unspecified: Secondary | ICD-10-CM | POA: Diagnosis not present

## 2017-11-13 LAB — CBC AND DIFFERENTIAL
HEMATOCRIT: 38 (ref 36–46)
HEMOGLOBIN: 12.9 (ref 12.0–16.0)
PLATELETS: 369 (ref 150–399)
WBC: 5.4

## 2017-11-13 LAB — BASIC METABOLIC PANEL
BUN: 12 (ref 4–21)
Creatinine: 0.6 (ref <=1.1)
Glucose: 124
POTASSIUM: 4.3 (ref 3.4–5.3)
Sodium: 131 — AB (ref 137–147)

## 2017-11-13 LAB — HEPATIC FUNCTION PANEL
ALK PHOS: 158 — AB (ref 25–125)
ALT: 13 (ref 7–35)
AST: 18 (ref 13–35)
BILIRUBIN, TOTAL: 0.3

## 2017-11-13 LAB — IRON,TIBC AND FERRITIN PANEL: Ferritin: 477

## 2017-11-13 LAB — TSH: TSH: 2.99 (ref <=5.90)

## 2017-11-14 ENCOUNTER — Other Ambulatory Visit: Payer: Self-pay | Admitting: *Deleted

## 2017-11-14 LAB — COMPREHENSIVE METABOLIC PANEL
ALBUMIN: 4
CO2: 24
Calcium: 9
Chloride: 98
EGFR (Non-African Amer.): 84
GLOBULIN: 2.9
PROTEIN: 6.9

## 2017-11-18 DIAGNOSIS — R5383 Other fatigue: Secondary | ICD-10-CM | POA: Diagnosis not present

## 2017-11-18 DIAGNOSIS — D509 Iron deficiency anemia, unspecified: Secondary | ICD-10-CM | POA: Diagnosis not present

## 2017-11-18 LAB — BASIC METABOLIC PANEL
BUN: 17 (ref 4–21)
BUN: 17 (ref 4–21)
CREATININE: 0.6 (ref <=1.1)
Creatinine: 0.6 (ref <=1.1)
GLUCOSE: 90
POTASSIUM: 4.7 (ref 3.4–5.3)
Potassium: 4.7 (ref 3.4–5.3)
SODIUM: 131 — AB (ref 137–147)
Sodium: 131 — AB (ref 137–147)

## 2017-11-18 LAB — HEPATIC FUNCTION PANEL
ALK PHOS: 156 — AB (ref 25–125)
ALT: 13 (ref 7–35)
ALT: 13 (ref 7–35)
AST: 18 (ref 13–35)
AST: 18 (ref 13–35)
Alkaline Phosphatase: 156 — AB (ref 25–125)
BILIRUBIN, TOTAL: 0.3
Bilirubin, Total: 0.3

## 2017-11-18 LAB — IRON,TIBC AND FERRITIN PANEL
IRON: 59
IRON: 59

## 2017-11-19 ENCOUNTER — Other Ambulatory Visit: Payer: Self-pay | Admitting: *Deleted

## 2017-11-19 ENCOUNTER — Encounter: Payer: Self-pay | Admitting: *Deleted

## 2017-11-19 LAB — COMPREHENSIVE METABOLIC PANEL
Albumin: 3.5
CHLORIDE: 99
CO2: 26
Calcium: 8.8
EGFR (Non-African Amer.): 83
Globulin: 2.2
Protein: 5.7

## 2017-11-19 NOTE — Progress Notes (Signed)
This encounter was created in error - please disregard.

## 2017-12-04 ENCOUNTER — Encounter: Payer: Self-pay | Admitting: Internal Medicine

## 2017-12-04 ENCOUNTER — Non-Acute Institutional Stay (SKILLED_NURSING_FACILITY): Payer: Medicare Other | Admitting: Internal Medicine

## 2017-12-04 DIAGNOSIS — D509 Iron deficiency anemia, unspecified: Secondary | ICD-10-CM | POA: Diagnosis not present

## 2017-12-04 DIAGNOSIS — J42 Unspecified chronic bronchitis: Secondary | ICD-10-CM

## 2017-12-04 DIAGNOSIS — H04123 Dry eye syndrome of bilateral lacrimal glands: Secondary | ICD-10-CM | POA: Diagnosis not present

## 2017-12-04 DIAGNOSIS — F0150 Vascular dementia without behavioral disturbance: Secondary | ICD-10-CM

## 2017-12-04 DIAGNOSIS — K219 Gastro-esophageal reflux disease without esophagitis: Secondary | ICD-10-CM | POA: Diagnosis not present

## 2017-12-04 DIAGNOSIS — F418 Other specified anxiety disorders: Secondary | ICD-10-CM | POA: Diagnosis not present

## 2017-12-04 DIAGNOSIS — I10 Essential (primary) hypertension: Secondary | ICD-10-CM

## 2017-12-04 MED ORDER — METOPROLOL TARTRATE 25 MG PO TABS: ORAL_TABLET | ORAL | 3 refills | Status: DC

## 2017-12-04 NOTE — Progress Notes (Signed)
Location:  [facility] Room Number: 42-B Place of Service:  SNF [phone]) Provider:  Blanchie Serve MD  Blanchie Serve, MD  Patient Care Team: Blanchie Serve, MD as PCP - General (Internal Medicine) [name], MD as Referring Physician (Ophthalmology)  Extended Emergency Contact Information Primary Emergency Contact: [name] Address: [address]          [place], [place] [address] [name] of [place] Phone: [phone] Mobile Phone: [phone] Relation: Son Secondary Emergency Contact: [name] Address: [address]          [place], [place] [address] [name] of [place] Phone: [phone] Mobile Phone: [phone] Relation: Son  Code Status:  DNR  Goals of care: Advanced Directive information Advanced Directives 11/03/2017  Does Patient Have a Medical Advance Directive? Yes  Type of Advance Directive Out of facility DNR (pink MOST or yellow form)  Does patient want to make changes to medical advance directive? No - Patient declined  Copy of [place] in Chart? -  Would patient like information on creating a medical advance directive? -  Pre-existing out of facility DNR order (yellow form or pink MOST form) Yellow form placed in chart (order not valid for inpatient use)     Chief Complaint  Patient presents with  . Medical Management of Chronic Issues    F/U- Anemia, fatigue, dementia,anxiety    HPI:  Pt is a 84 y.o. female seen today for medical management of chronic diseases. She denies any concern this visit. No acute concern from nursing.  Dementia without behavioral disturbance- currently on memantine 10 mg bid. Alert, can make her needs known, pleasantly confused. Requires help with her ADLs, uses walker for ambulation. High fall risk.   gerd- denies any reflux symptom, taking omeprazole 20 mg daily.   Dry eyes- has corrective glasses. Gets artificial tear drop qid. Denies any  new vision problem.   Chronic bronchitis- Taking breo ellipta daily and prn albuterol, no acute exacerbations reported  hypertension- stable BP reading on chart review. chest pain free, has not required prn NTG. Takes metoprolol tartrate 50 mg am and 25 mg pm  Chronic depression- on venlafaxine 150 mg daily and scheduled lorazepam, mood overall stable   Past Medical History:  Diagnosis Date  . Anemia, iron deficiency    takes Ferrous Sulfate daily  . Depression    takes Effexor daily  . Gait disorder 04/21/2014  . GERD (gastroesophageal reflux disease)    takes Omeprazole daily  . Herpes ocular    history of-takes Acyclovir daily  . High cholesterol    takes Atorvastatin daily  . History of hiatal hernia   . Hypertension    takes Metoprolol daily  . Insomnia    takes Xanax nightly  . Osteoarthritis of knee    bilateral  . Sciatic pain    Past Surgical History:  Procedure Laterality Date  . ABDOMINAL HYSTERECTOMY  1995   partial  . CARPAL TUNNEL RELEASE Right 07/21/2007  . CARPAL TUNNEL RELEASE Left 09/24/2007  . COLONOSCOPY    . DESCEMETS STRIPPING AUTOMATED ENDOTHELIAL KERATOPLASTY Left 03/14/2011  . DESCEMETS STRIPPING AUTOMATED ENDOTHELIAL KERATOPLASTY Right 08/20/2012  . EYE SURGERY Bilateral    cataract surgery  . EYE SURGERY Left    corneal transplant  . HERNIA REPAIR    . HIP ARTHROPLASTY Left 06/13/2013   Procedure: ARTHROPLASTY BIPOLAR HIP; Injection left shoulder;  Surgeon: Johnny Bridge, MD;  Location: [facility];  Service: Orthopedics;  Laterality: Left;  . JOINT REPLACEMENT     bilateral knees, left knee  . KNEE ARTHROSCOPY Right 05/11/2001  . KYPHOPLASTY N/A 04/10/2015   Procedure: T11 Kyphoplasty;  Surgeon: Jovita Gamma, MD;  Location: [facility];  Service: Neurosurgery;  Laterality: N/A;  T11 Kyphoplasty  . KYPHOPLASTY N/A 05/06/2015   Procedure: KYPHOPLASTY Thoracic twelve;  Surgeon: Jovita Gamma, MD;  Location: [facility];  Service:  Neurosurgery;  Laterality: N/A;  T12 Kyphoplasty  . LAPAROSCOPIC NISSEN FUNDOPLICATION  12/11/5091  . LUMBAR LAMINECTOMY/DECOMPRESSION MICRODISCECTOMY  08/29/2010   L2-S1  . SHOULDER ARTHROSCOPY WITH ROTATOR CUFF REPAIR AND SUBACROMIAL DECOMPRESSION Right 01/01/2000  . TONSILLECTOMY     as child  . TOTAL HIP REVISION Left 07/08/2014   Procedure: TOTAL HIP REVISION;  Surgeon: Kerin Salen, MD;  Location: [facility];  Service: Orthopedics;  Laterality: Left;  . TOTAL KNEE ARTHROPLASTY Left 05/14/2004  . TOTAL KNEE ARTHROPLASTY  12/23/2011   Procedure: TOTAL KNEE ARTHROPLASTY;  Surgeon: Lorn Junes, MD;  Location: [facility];  Service: Orthopedics;  Laterality: Right;  DR [place] THIS CASE  . TRIGGER FINGER RELEASE Right 07/21/2007   thumb  . TRIGGER FINGER RELEASE Left 09/24/2007   middle finger  . TRIGGER FINGER RELEASE Right 03/10/2008   ring and little fingers  . TRIGGER FINGER RELEASE Right 09/02/2012   Procedure: RELEASE TRIGGER FINGER/A-1 PULLEY RIGHT INDEX FINGER;  Surgeon: Wynonia Sours, MD;  Location: [facility];  Service: Orthopedics;  Laterality: Right;  . TRIGGER FINGER RELEASE Left 12/22/2012   Procedure: RELEASE TRIGGER FINGER/A-1 PULLEY LEFT RING FINGER;  Surgeon: Wynonia Sours, MD;  Location: [facility];  Service: Orthopedics;  Laterality: Left;  Left     Allergies  Allergen Reactions  . Morphine And Related Other (See Comments)    AGITATION, STRANGE DREAMS  . Scopolamine Other (See Comments)    MENTAL CHANGES Other reaction(s): Delusions (intolerance), Other (See Comments) MENTAL CHANGES  . Atorvastatin Other (See Comments)    On paperwork from facility Other reaction(s): Other (See Comments)  . Morphine     Other reaction(s): Confusion (intolerance), Other (See Comments) Disoriented, mood changes  . Nsaids Other (See Comments)    On paperwork from facility Other reaction(s): Other (See Comments)  . Pravachol [Pravastatin Sodium]  Other (See Comments)    On paperwork from facility  . Pravastatin     Other reaction(s): Other (See Comments)  . Teriparatide     Other reaction(s): Other (See Comments)    Outpatient Encounter Medications as of 12/04/2017  Medication Sig  . acetaminophen (TYLENOL) 500 MG tablet Take 500 mg by mouth every 6 (six) hours as needed for pain.  Marland Kitchen albuterol (PROVENTIL) (2.5 MG/3ML) 0.083% nebulizer solution Take 2.5 mg by nebulization every 4 (four) hours as needed for wheezing or shortness of breath.  Marland Kitchen aspirin EC 81 MG tablet Take 81 mg by mouth daily.  . Cholecalciferol (VITAMIN D) 2000 units CAPS Take 2,000 Units by mouth daily.  Marland Kitchen Dextran 70-Hypromellose (ARTIFICIAL TEARS) 0.1-0.3 % SOLN Apply 1 drop to eye 4 (four) times daily.  Marland Kitchen docusate sodium (COLACE) 100 MG capsule Take 1 capsule by mouth daily as needed for mild constipation.   . feeding supplement (BOOST / RESOURCE BREEZE) LIQD Take 120 mLs by mouth 3 (three) times daily between meals. For weight loss  . fluticasone furoate-vilanterol (BREO ELLIPTA) 100-25 MCG/INH AEPB Inhale 1 puff into the lungs daily.  Marland Kitchen loratadine (CLARITIN)  10 MG tablet Take 10 mg by mouth daily as needed for allergies.  Marland Kitchen LORazepam (ATIVAN) 0.5 MG tablet Take 0.25 mg by mouth 2 (two) times daily.   . magnesium hydroxide (MILK OF MAGNESIA) 400 MG/5ML suspension Take 15 mLs by mouth at bedtime as needed for mild constipation.   . memantine (NAMENDA) 10 MG tablet Take 10 mg by mouth 2 (two) times daily.  . nitroGLYCERIN (NITROSTAT) 0.4 MG SL tablet Place 0.4 mg under the tongue every 5 (five) minutes as needed for chest pain.  Marland Kitchen omeprazole (PRILOSEC) 20 MG capsule Take 20 mg by mouth daily.   . traMADol (ULTRAM) 50 MG tablet Take 50 mg by mouth every 8 (eight) hours as needed for severe pain.   Marland Kitchen venlafaxine XR (EFFEXOR-XR) 150 MG 24 hr capsule Take 150 mg by mouth at bedtime.   . [DISCONTINUED] metoprolol succinate (TOPROL-XL) 25 MG 24 hr tablet Take 25 mg by  mouth at bedtime. Take 2 tablets by mouth each morning  . [DISCONTINUED] metoprolol tartrate (LOPRESSOR) 25 MG tablet Take 25 mg by mouth.  . metoprolol tartrate (LOPRESSOR) 25 MG tablet Take 2 tablet = 50 mg in am and 1 tablet at bedtime  . [DISCONTINUED] hypromellose (GENTEAL SEVERE) 0.3 % GEL ophthalmic ointment Place 1 application into both eyes at bedtime as needed for dry eyes.  . [DISCONTINUED] memantine (NAMENDA TITRATION PACK) tablet pack Take 5 mg by mouth 2 (two) times daily. 5 mg/day for =1 week; 5 mg twice daily for =1 week; 15 mg/day given in 5 mg and 10 mg separated doses for =1 week; then 10 mg twice daily  . [DISCONTINUED] OXYGEN Inhale 2 L into the lungs as needed (If 02 stat drops below 90%).   No facility-administered encounter medications on file as of 12/04/2017.     Review of Systems  Constitutional: Negative for appetite change, chills and fever.  HENT: Positive for hearing loss. Negative for ear pain, sinus pressure, tinnitus, trouble swallowing and voice change.   Eyes: Positive for visual disturbance. Negative for pain and itching.  Respiratory: Negative for cough, shortness of breath and wheezing.   Cardiovascular: Negative for chest pain, palpitations and leg swelling.  Gastrointestinal: Negative for abdominal pain, blood in stool, diarrhea, nausea and vomiting.       Continent with bowel and bladder  Genitourinary: Negative for dysuria.  Musculoskeletal: Positive for gait problem. Negative for back pain.       Needs 1 person assistance (limited) with ADLs, uses walker for ambulation  Skin: Negative for rash and wound.  Neurological: Negative for dizziness, seizures and headaches.  Psychiatric/Behavioral: Positive for confusion. Negative for behavioral problems.    Immunization History  Administered Date(s) Administered  . Influenza-Unspecified 04/14/2017  . Pneumococcal Polysaccharide-23 06/14/2013   Pertinent  Health Maintenance Due  Topic Date Due  .  PNA vac Low Risk Adult (2 of 2 - PCV13) 06/14/2014  . INFLUENZA VACCINE  01/22/2018  . DEXA SCAN  Completed   Fall Risk  09/19/2017  Falls in the past year? No   Functional Status Survey:    Vitals:   12/04/17 1058  BP: 138/82  Pulse: 66  Resp: 20  Temp: (!) 97.2 F (36.2 C)  Weight: 116 lb 1.6 oz (52.7 kg)  Height: 5\' 1"  (1.549 m)   Body mass index is 21.94 kg/m.   Wt Readings from Last 3 Encounters:  12/04/17 116 lb 1.6 oz (52.7 kg)  11/11/17 115 lb 12.8 oz (52.5 kg)  11/03/17 114 lb (51.7 kg)   Physical Exam  Constitutional: She appears well-developed and well-nourished. No distress.  HENT:  Head: Normocephalic and atraumatic.  Right Ear: External ear normal.  Left Ear: External ear normal.  Nose: Nose normal.  Mouth/Throat: Oropharynx is clear and moist. No oropharyngeal exudate.  Eyes: Pupils are equal, round, and reactive to light. Conjunctivae and EOM are normal. Right eye exhibits no discharge. Left eye exhibits no discharge.  Has corrective glasses  Neck: Normal range of motion. Neck supple.  Cardiovascular: Normal rate and regular rhythm.  Murmur heard. Pulmonary/Chest: Effort normal and breath sounds normal. She has no wheezes. She has no rales.  Abdominal: Soft. Bowel sounds are normal. There is no tenderness. There is no guarding.  Musculoskeletal: She exhibits edema.  Able to move all 4 extremities, unsteady gait, uses walker for ambulation, trace ankle edema  Lymphadenopathy:    She has no cervical adenopathy.  Neurological: She is alert.  Oriented to person and place but not to time  Skin: Skin is warm and dry. She is not diaphoretic.  Psychiatric: She has a normal mood and affect. Her behavior is normal.    Labs reviewed: Recent Labs    07/30/17 1113 08/03/17 1851 08/08/17 1135  11/04/17 11/13/17 11/18/17  NA 130* 131* 130*   < > 133* 131* 131*  131*  K 4.3 4.4 4.5   < > 3.9 4.3 4.7  4.7  CL 98* 96* 96*  --  98 98 99  CO2 21* 26 22  --   26 24 26   GLUCOSE 94 96 91  --   --   --   --   BUN 14 17 11    < > 11 12 17  17   CREATININE 0.79 0.72 0.67   < > 0.6 0.6 0.6  0.6  CALCIUM 8.9 9.1 9.1  --  9.2 9.0 8.8   < > = values in this interval not displayed.   Recent Labs    07/30/17 1113 08/03/17 1851  11/04/17 11/13/17 11/18/17  AST 24 23   < > 20 18 18  18   ALT 16 16   < > 15 13 13  13   ALKPHOS 148* 142*   < > 207* 158* 156*  156*  BILITOT 0.3 0.3  --   --   --   --   PROT 6.6 7.5  --   --   --   --   ALBUMIN 3.4* 4.0  --  4.2 4.0 3.5   < > = values in this interval not displayed.   Recent Labs    07/30/17 1113 08/03/17 1851 08/08/17 1135  10/07/17 10/30/17 11/13/17  WBC 8.4 6.8 8.0   < > 5.5 5.3 5.4  NEUTROABS 5.2 3.5 4.8  --   --   --   --   HGB 12.1 13.0 13.1   < > 12.0 11.6* 12.9  HCT 35.8* 38.8 39.5   < > 34* 34* 38  MCV 96.5 97.5 97.8  --   --   --   --   PLT 325 375 318   < > 340 363 369   < > = values in this interval not displayed.   Lab Results  Component Value Date   TSH 2.99 11/13/2017   Lab Results  Component Value Date   HGBA1C 5.8 06/29/2013   No results found for: CHOL, HDL, LDLCALC, LDLDIRECT, TRIG, CHOLHDL   Iron/TIBC/Ferritin/ %Sat    Component Value  Date/Time   IRON 59 11/18/2017   IRON 59 11/18/2017   FERRITIN 477 11/13/2017     Significant Diagnostic Results in last 30 days:  No results found.  Assessment/Plan  1. Essential hypertension Stable reading, reviewed renal function, continue metoprolol tartrate and monitor BP  2. Chronic bronchitis, unspecified chronic bronchitis type ([place]) Continue bronchodilator, monitor for signs of exacerbation  3. Gastroesophageal reflux disease, esophagitis presence not specified Continue PPI and monitor  4. Vascular dementia without behavioral disturbance Controlled BP, continue memantine  5. Depression with anxiety Continue venlafaxine and benzodiazepine  6. Dry eyes, bilateral Continue artificial tear drop  7. Iron  deficiency anemia, unspecified iron deficiency anemia type Pt provides history of iron deficiency and being on chronic iron supplement. Stable h&h but low iron and ferritin level. Start ferrous sulfate 325 mg daily for now with her history and fatigue.      Family/ staff Communication: reviewed care plan with patient and charge nurse.    Labs/tests ordered:  none   Blanchie Serve, MD Internal Medicine [facility] [address] [place], [place] [address] Cell Phone (Monday-Friday 8 am - 5 pm): [phone] On Call: [phone] and follow prompts after 5 pm and on weekends Office Phone: [phone] Office Fax: [phone]

## 2017-12-20 ENCOUNTER — Emergency Department (HOSPITAL_COMMUNITY): Payer: Medicare Other

## 2017-12-20 ENCOUNTER — Encounter (HOSPITAL_COMMUNITY): Payer: Self-pay

## 2017-12-20 ENCOUNTER — Emergency Department (HOSPITAL_COMMUNITY)
Admission: EM | Admit: 2017-12-20 | Discharge: 2017-12-20 | Disposition: A | Discharge status: Home / Self Care | Payer: Medicare Other | Attending: Emergency Medicine | Admitting: Emergency Medicine

## 2017-12-20 DIAGNOSIS — S4992XA Unspecified injury of left shoulder and upper arm, initial encounter: Secondary | ICD-10-CM | POA: Diagnosis present

## 2017-12-20 DIAGNOSIS — Z79899 Other long term (current) drug therapy: Secondary | ICD-10-CM | POA: Diagnosis not present

## 2017-12-20 DIAGNOSIS — Z7982 Long term (current) use of aspirin: Secondary | ICD-10-CM | POA: Diagnosis not present

## 2017-12-20 DIAGNOSIS — W1830XA Fall on same level, unspecified, initial encounter: Secondary | ICD-10-CM | POA: Insufficient documentation

## 2017-12-20 DIAGNOSIS — Y999 Unspecified external cause status: Secondary | ICD-10-CM | POA: Diagnosis not present

## 2017-12-20 DIAGNOSIS — Z743 Need for continuous supervision: Secondary | ICD-10-CM | POA: Diagnosis not present

## 2017-12-20 DIAGNOSIS — I959 Hypotension, unspecified: Secondary | ICD-10-CM | POA: Diagnosis not present

## 2017-12-20 DIAGNOSIS — R609 Edema, unspecified: Secondary | ICD-10-CM | POA: Diagnosis not present

## 2017-12-20 DIAGNOSIS — S42212A Unspecified displaced fracture of surgical neck of left humerus, initial encounter for closed fracture: Secondary | ICD-10-CM | POA: Diagnosis not present

## 2017-12-20 DIAGNOSIS — S42292A Other displaced fracture of upper end of left humerus, initial encounter for closed fracture: Secondary | ICD-10-CM

## 2017-12-20 DIAGNOSIS — Z96652 Presence of left artificial knee joint: Secondary | ICD-10-CM | POA: Insufficient documentation

## 2017-12-20 DIAGNOSIS — R279 Unspecified lack of coordination: Secondary | ICD-10-CM | POA: Diagnosis not present

## 2017-12-20 DIAGNOSIS — Y929 Unspecified place or not applicable: Secondary | ICD-10-CM | POA: Insufficient documentation

## 2017-12-20 DIAGNOSIS — I10 Essential (primary) hypertension: Secondary | ICD-10-CM | POA: Diagnosis not present

## 2017-12-20 DIAGNOSIS — Z87891 Personal history of nicotine dependence: Secondary | ICD-10-CM | POA: Diagnosis not present

## 2017-12-20 DIAGNOSIS — Z96642 Presence of left artificial hip joint: Secondary | ICD-10-CM | POA: Diagnosis not present

## 2017-12-20 DIAGNOSIS — Y939 Activity, unspecified: Secondary | ICD-10-CM | POA: Insufficient documentation

## 2017-12-20 DIAGNOSIS — R0902 Hypoxemia: Secondary | ICD-10-CM | POA: Diagnosis not present

## 2017-12-20 DIAGNOSIS — W19XXXA Unspecified fall, initial encounter: Secondary | ICD-10-CM | POA: Diagnosis not present

## 2017-12-20 MED ORDER — TRAMADOL HCL 50 MG PO TABS: 50.0000 mg | ORAL_TABLET | Freq: Four times a day (QID) | ORAL | 0 refills | Status: DC | PRN

## 2017-12-20 NOTE — ED Provider Notes (Signed)
[facility]ER[facility]ENC[facility] DEPARTMENT Provider Note   CSN: [id number] Arrival date & time: 12/20/17  2025     History   Chief Complaint Chief Complaint  Patient presents with  . Shoulder Injury    HPI Kristin Solomon is a 84 y.o. female.  Patient fell on her left shoulder.  She did not hit her head.  She just misstepped and complains of pain in her upper left arm  The history is provided by the patient. No language interpreter was used.  Fall  This is a new problem. The current episode started 1 to 2 hours ago. The problem occurs rarely. The problem has been resolved. Pertinent negatives include no chest pain, no abdominal pain and no headaches. Exacerbated by: Movement. Nothing relieves the symptoms. She has tried nothing for the symptoms. The treatment provided no relief.    Past Medical History:  Diagnosis Date  . Anemia, iron deficiency    takes Ferrous Sulfate daily  . Depression    takes Effexor daily  . Gait disorder 04/21/2014  . GERD (gastroesophageal reflux disease)    takes Omeprazole daily  . Herpes ocular    history of-takes Acyclovir daily  . High cholesterol    takes Atorvastatin daily  . History of hiatal hernia   . Hypertension    takes Metoprolol daily  . Insomnia    takes Xanax nightly  . Osteoarthritis of knee    bilateral  . Sciatic pain     Patient Active Problem List   Diagnosis Date Noted  . Dry eyes, bilateral 12/04/2017  . Fatigue 11/11/2017  . Elevated serum alkaline phosphatase level 11/04/2017  . Urinary tract infection due to Enterococcus 10/09/2017  . Dysuria 10/06/2017  . Urinary frequency 10/06/2017  . Vascular dementia 10/06/2017  . Fall 10/02/2017  . Facial laceration 10/01/2017  . Nasal bone fracture 10/01/2017  . Closed fracture of left olecranon process 09/02/2017  . Oropharyngeal dysphagia 09/02/2017  . Advance care planning 09/02/2017  . Chronic bronchitis ([place]) 09/02/2017  . Olecranon  fracture 08/28/2017  . Metacarpal bone fracture 08/28/2017  . Hyponatremia 08/22/2017  . T12 compression fracture ([place]) 05/06/2015  . Wedge compression fracture of T11 vertebra ([place]) 04/10/2015  . Left hip pain 07/13/2014  . GERD (gastroesophageal reflux disease) 07/13/2014  . Dry mouth 07/13/2014  . 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 Gait disorder 04/21/2014  . Constipation 06/21/2013  . Depression with anxiety 06/21/2013  . Diverticulosis of colon without hemorrhage 06/13/2013  . S/P Nissen fundoplication (without gastrostomy tube) procedure 06/13/2013  . Lactose intolerance 06/13/2013  . PVCs (premature ventricular contractions) 06/13/2013  . Fracture of femoral neck, left ([place]) 06/12/2013  . Hip fracture requiring operative repair ([place]) 06/12/2013  . Closed left hip fracture ([place]) 06/12/2013  . Leukocytosis, unspecified 06/12/2013  . Postoperative anemia due to acute blood loss 12/25/2011  . Lumbar radiculopathy, chronic 12/23/2011  . Hyperlipidemia   . Hypertension   . Tachycardia   . Herpes ocular   . Chronic disease anemia   . Right knee DJD   . Status post total knee replacement     Past Surgical History:  Procedure Laterality Date  . ABDOMINAL HYSTERECTOMY  1995   partial  . CARPAL TUNNEL RELEASE Right 07/21/2007  . CARPAL TUNNEL RELEASE Left 09/24/2007  . COLONOSCOPY    . DESCEMETS STRIPPING AUTOMATED ENDOTHELIAL KERATOPLASTY Left 03/14/2011  . DESCEMETS STRIPPING AUTOMATED ENDOTHELIAL KERATOPLASTY Right 08/20/2012  . EYE SURGERY Bilateral  cataract surgery  . EYE SURGERY Left    corneal transplant  . HERNIA REPAIR    . HIP ARTHROPLASTY Left 06/13/2013   Procedure: ARTHROPLASTY BIPOLAR HIP; Injection left shoulder;  Surgeon: Johnny Bridge, MD;  Location: [facility];  Service: Orthopedics;  Laterality: Left;  . JOINT REPLACEMENT     bilateral knees, left knee  . KNEE ARTHROSCOPY Right 05/11/2001  . KYPHOPLASTY N/A 04/10/2015   Procedure: T11 Kyphoplasty;   Surgeon: Jovita Gamma, MD;  Location: [facility];  Service: Neurosurgery;  Laterality: N/A;  T11 Kyphoplasty  . KYPHOPLASTY N/A 05/06/2015   Procedure: KYPHOPLASTY Thoracic twelve;  Surgeon: Jovita Gamma, MD;  Location: [facility];  Service: Neurosurgery;  Laterality: N/A;  T12 Kyphoplasty  . LAPAROSCOPIC NISSEN FUNDOPLICATION  5/62/1308  . LUMBAR LAMINECTOMY/DECOMPRESSION MICRODISCECTOMY  08/29/2010   L2-S1  . SHOULDER ARTHROSCOPY WITH ROTATOR CUFF REPAIR AND SUBACROMIAL DECOMPRESSION Right 01/01/2000  . TONSILLECTOMY     as child  . TOTAL HIP REVISION Left 07/08/2014   Procedure: TOTAL HIP REVISION;  Surgeon: Kerin Salen, MD;  Location: [facility];  Service: Orthopedics;  Laterality: Left;  . TOTAL KNEE ARTHROPLASTY Left 05/14/2004  . TOTAL KNEE ARTHROPLASTY  12/23/2011   Procedure: TOTAL KNEE ARTHROPLASTY;  Surgeon: Lorn Junes, MD;  Location: [facility];  Service: Orthopedics;  Laterality: Right;  DR Lemitar THIS CASE  . TRIGGER FINGER RELEASE Right 07/21/2007   thumb  . TRIGGER FINGER RELEASE Left 09/24/2007   middle finger  . TRIGGER FINGER RELEASE Right 03/10/2008   ring and little fingers  . TRIGGER FINGER RELEASE Right 09/02/2012   Procedure: RELEASE TRIGGER FINGER/A-1 PULLEY RIGHT INDEX FINGER;  Surgeon: Wynonia Sours, MD;  Location: [facility];  Service: Orthopedics;  Laterality: Right;  . TRIGGER FINGER RELEASE Left 12/22/2012   Procedure: RELEASE TRIGGER FINGER/A-1 PULLEY LEFT RING FINGER;  Surgeon: Wynonia Sours, MD;  Location: [facility];  Service: Orthopedics;  Laterality: Left;  Left      OB History   None      Home Medications    Prior to Admission medications   Medication Sig Start Date End Date Taking? Authorizing Provider  acetaminophen (TYLENOL) 500 MG tablet Take 500 mg by mouth every 6 (six) hours as needed for pain.   Yes [provider]  albuterol (PROVENTIL) (2.5 MG/3ML) 0.083% nebulizer solution Take  2.5 mg by nebulization every 4 (four) hours as needed for wheezing or shortness of breath.   Yes [provider]  aspirin EC 81 MG tablet Take 81 mg by mouth daily.   Yes [provider]  Cholecalciferol (VITAMIN D) 2000 units CAPS Take 2,000 Units by mouth daily.   Yes [provider]  Dextran 70-Hypromellose (ARTIFICIAL TEARS) 0.1-0.3 % SOLN Apply 1 drop to eye 4 (four) times daily.   Yes [provider]  feeding supplement (BOOST / RESOURCE BREEZE) LIQD Take 120 mLs by mouth 3 (three) times daily between meals. For weight loss   Yes [provider]  fluticasone furoate-vilanterol (BREO ELLIPTA) 100-25 MCG/INH AEPB Inhale 1 puff into the lungs daily.   Yes [provider]  loratadine (CLARITIN) 10 MG tablet Take 10 mg by mouth daily as needed for allergies.   Yes [provider]  LORazepam (ATIVAN) 0.5 MG tablet Take 0.25 mg by mouth 2 (two) times daily.    Yes [provider]  magnesium hydroxide (MILK OF MAGNESIA) 400 MG/5ML suspension  Take 15 mLs by mouth at bedtime as needed for mild constipation.    Yes [provider]  memantine (NAMENDA) 10 MG tablet Take 10 mg by mouth 2 (two) times daily.   Yes [provider]  metoprolol tartrate (LOPRESSOR) 25 MG tablet Take 2 tablet = 50 mg in am and 1 tablet at bedtime Patient taking differently: Take 25-50 mg by mouth See admin instructions. 50 mg in am and 25mg  at bedtime 12/04/17  Yes Blanchie Serve, MD  nitroGLYCERIN (NITROSTAT) 0.4 MG SL tablet Place 0.4 mg under the tongue every 5 (five) minutes as needed for chest pain.   Yes [provider]  omeprazole (PRILOSEC) 20 MG capsule Take 20 mg by mouth daily.    Yes [provider]  traMADol (ULTRAM) 50 MG tablet Take 50 mg by mouth every 8 (eight) hours as needed for severe pain.    Yes [provider]  venlafaxine XR (EFFEXOR-XR) 150 MG 24 hr capsule Take 150 mg by mouth at bedtime.     Yes [provider]  docusate sodium (COLACE) 100 MG capsule Take 1 capsule by mouth daily as needed for mild constipation.  06/15/13   [provider]  traMADol (ULTRAM) 50 MG tablet Take 1 tablet (50 mg total) by mouth every 6 (six) hours as needed. 12/20/17   Milton Ferguson, MD    Family History Family History  Problem Relation Age of Onset  . Heart attack Mother   . Parkinsonism Father   . Diabetes Brother     Social History Social History   Tobacco Use  . Smoking status: Former Research scientist (life sciences)  . Smokeless tobacco: Never Used  . Tobacco comment: quit smoking 20-30 yr. ago  Substance Use Topics  . Alcohol use: No  . Drug use: Yes     Allergies   Morphine and related; Scopolamine; Atorvastatin; Morphine; Nsaids; Pravachol [pravastatin sodium]; Pravastatin; and Teriparatide   Review of Systems Review of Systems  Constitutional: Negative for appetite change and fatigue.  HENT: Negative for congestion, ear discharge and sinus pressure.   Eyes: Negative for discharge.  Respiratory: Negative for cough.   Cardiovascular: Negative for chest pain.  Gastrointestinal: Negative for abdominal pain and diarrhea.  Genitourinary: Negative for frequency and hematuria.  Musculoskeletal: Negative for back pain.       Left shoulder pain  Skin: Negative for rash.  Neurological: Negative for seizures and headaches.  Psychiatric/Behavioral: Negative for hallucinations.     Physical Exam Updated Vital Signs BP (!) 153/78   Pulse 85   Resp (!) 22   Ht 5\' 4"  (1.626 m)   Wt 59 kg (130 lb)   SpO2 95%   BMI 22.31 kg/m   Physical Exam  Constitutional: She is oriented to person, place, and time. She appears well-developed.  HENT:  Head: Normocephalic.  Eyes: Conjunctivae and EOM are normal. No scleral icterus.  Neck: Neck supple. No thyromegaly present.  Cardiovascular: Normal rate and regular rhythm. Exam reveals no gallop and no friction rub.  No murmur  heard. Pulmonary/Chest: No stridor. She has no wheezes. She has no rales. She exhibits no tenderness.  Abdominal: She exhibits no distension. There is no tenderness. There is no rebound.  Musculoskeletal: She exhibits no edema.  Tenderness to mid left humerus.  Neurovascular exam normal  Lymphadenopathy:    She has no cervical adenopathy.  Neurological: She is oriented to person, place, and time. She exhibits normal muscle tone. Coordination normal.  Skin: No rash noted.  No erythema.  Psychiatric: She has a normal mood and affect. Her behavior is normal.     ED Treatments / Results  Labs (all labs ordered are listed, but only abnormal results are displayed) Labs Reviewed - No data to display  EKG None  Radiology Dg Shoulder Left  Result Date: 12/20/2017 CLINICAL DATA:  Recent fall with left shoulder pain, initial encounter EXAM: LEFT SHOULDER - 2+ VIEW COMPARISON:  06/12/2013 FINDINGS: There is a comminuted fracture in the proximal left humerus just below the surgical neck with impaction and angulation at the fracture site. The humeral head appears well seated. The underlying bony thorax is within limits. No other focal abnormality is seen. IMPRESSION: Impacted and angulated fracture of the proximal left humerus just below the surgical neck. Electronically Signed   By: Inez Catalina M.D.   On: 12/20/2017 21:04    Procedures Procedures (including critical care time)  Medications Ordered in ED Medications - No data to display   Initial Impression / Assessment and Plan / ED Course  I have reviewed the triage vital signs and the nursing notes.  Pertinent labs & imaging results that were available during my care of the patient were reviewed by me and considered in my medical decision making (see chart for details).     X-ray shows proximal humeral head fracture.  Patient has a closed fracture of the left humerus.  I spoke with orthopedics and they recommended sling and pain  medicine and follow-up in 2 weeks  Final Clinical Impressions(s) / ED Diagnoses   Final diagnoses:  Humerus head fracture, left, closed, initial encounter    ED Discharge Orders        Ordered    traMADol (ULTRAM) 50 MG tablet  Every 6 hours PRN     12/20/17 2142       Milton Ferguson, MD 12/20/17 2145

## 2017-12-20 NOTE — Discharge Instructions (Addendum)
Follow-up with Dr. Doreatha Martin in 2 weeks.  Get seen sooner if problems

## 2017-12-20 NOTE — ED Triage Notes (Signed)
Pt fell onto left shoulder from walker. Pt given 75mg  fentanyl and became sleepy. Pt is a dnr

## 2017-12-20 NOTE — ED Notes (Signed)
[facility] called  

## 2017-12-20 NOTE — ED Notes (Signed)
Ortho tech at bedside 

## 2017-12-20 NOTE — ED Notes (Signed)
Pt alert and in NAD. Pt verbalized understanding of discharge instructions. [facility] contacted for transport.

## 2017-12-20 NOTE — Progress Notes (Signed)
Orthopedic Tech Progress Note Patient Details:  Kristin Solomon 27-Feb-1930 [id number]  Ortho Devices Type of Ortho Device: Sling immobilizer Ortho Device/Splint Location: lue Ortho Device/Splint Interventions: Ordered, Application, Adjustment   Post Interventions Patient Tolerated: Well Instructions Provided: Care of device, Adjustment of device   Karolee Stamps 12/20/2017, 11:22 PM

## 2017-12-22 ENCOUNTER — Non-Acute Institutional Stay (SKILLED_NURSING_FACILITY): Payer: Medicare Other | Admitting: Nurse Practitioner

## 2017-12-22 ENCOUNTER — Encounter: Payer: Self-pay | Admitting: Nurse Practitioner

## 2017-12-22 DIAGNOSIS — R269 Unspecified abnormalities of gait and mobility: Secondary | ICD-10-CM

## 2017-12-22 DIAGNOSIS — F0150 Vascular dementia without behavioral disturbance: Secondary | ICD-10-CM

## 2017-12-22 DIAGNOSIS — S42292S Other displaced fracture of upper end of left humerus, sequela: Secondary | ICD-10-CM

## 2017-12-22 MED ORDER — FERROUS SULFATE 325 (65 FE) MG PO TABS: 325.0000 mg | ORAL_TABLET | Freq: Every day | ORAL | Status: DC

## 2017-12-22 NOTE — Assessment & Plan Note (Signed)
Continue Sling immobilizer, f/u Ortho 2 weeks, schedule Tylenol 650mg  q8hr x 1 week, continue prn Tramadol.

## 2017-12-22 NOTE — Progress Notes (Signed)
Location:   SNF [facility] Room Number: 42B Place of Service:  SNF (31) Provider: Lennie Odor Doctor Sheahan NP  Blanchie Serve, MD  Patient Care Team: Blanchie Serve, MD as PCP - General (Internal Medicine) [name], MD as Referring Physician (Ophthalmology)  Extended Emergency Contact Information Primary Emergency Contact: [name] Address: [address]          [place], [place] [address] [name] of [place] Phone: [phone] Mobile Phone: [phone] Relation: Son Secondary Emergency Contact: [name] Address: [address]          [place], [place] [address] [name] of [place] Phone: [phone] Mobile Phone: [phone] Relation: Son  Code Status: DNR Goals of care: Advanced Directive information Advanced Directives 11/03/2017  Does Patient Have a Medical Advance Directive? Yes  Type of Advance Directive Out of facility DNR (pink MOST or yellow form)  Does patient want to make changes to medical advance directive? No - Patient declined  Copy of [place] in Chart? -  Would patient like information on creating a medical advance directive? -  Pre-existing out of facility DNR order (yellow form or pink MOST form) Yellow form placed in chart (order not valid for inpatient use)     Chief Complaint  Patient presents with  . Acute Visit    left humerous fracture    HPI:  Pt is a 84 y.o. female seen today for an acute visit for acute left humerus fractures(impactd and angulated fracture of the proximal left humerus just below the surgical neck) sustaiined from fall [place] 12/21/17, s/p ED evaluation, left sling immobilizer, f/u  Dr. Doreatha Martin in 2 weeks. Tramadol 50mg  q6h prn prescription sent from ED. She has history of dementia, lack of safety  Awareness, abnormal gait, multiple falls and fractures, on Memantine 10mg  bid for memory.    Past Medical History:  Diagnosis Date  . Anemia, iron deficiency    takes  Ferrous Sulfate daily  . Depression    takes Effexor daily  . Gait disorder 04/21/2014  . GERD (gastroesophageal reflux disease)    takes Omeprazole daily  . Herpes ocular    history of-takes Acyclovir daily  . High cholesterol    takes Atorvastatin daily  . History of hiatal hernia   . Hypertension    takes Metoprolol daily  . Insomnia    takes Xanax nightly  . Osteoarthritis of knee    bilateral  . Sciatic pain    Past Surgical History:  Procedure Laterality Date  . ABDOMINAL HYSTERECTOMY  1995   partial  . CARPAL TUNNEL RELEASE Right 07/21/2007  . CARPAL TUNNEL RELEASE Left 09/24/2007  . COLONOSCOPY    . DESCEMETS STRIPPING AUTOMATED ENDOTHELIAL KERATOPLASTY Left 03/14/2011  . DESCEMETS STRIPPING AUTOMATED ENDOTHELIAL KERATOPLASTY Right 08/20/2012  . EYE SURGERY Bilateral    cataract surgery  . EYE SURGERY Left    corneal transplant  . HERNIA REPAIR    . HIP ARTHROPLASTY Left 06/13/2013   Procedure: ARTHROPLASTY BIPOLAR HIP; Injection left shoulder;  Surgeon: Johnny Bridge, MD;  Location: [facility];  Service: Orthopedics;  Laterality: Left;  . JOINT REPLACEMENT     bilateral knees, left knee  . KNEE ARTHROSCOPY Right 05/11/2001  . KYPHOPLASTY N/A 04/10/2015   Procedure: T11 Kyphoplasty;  Surgeon: Jovita Gamma, MD;  Location: [facility];  Service: Neurosurgery;  Laterality: N/A;  T11 Kyphoplasty  . KYPHOPLASTY N/A 05/06/2015   Procedure: KYPHOPLASTY Thoracic twelve;  Surgeon: Jovita Gamma, MD;  Location: [facility];  Service: Neurosurgery;  Laterality: N/A;  T12 Kyphoplasty  . LAPAROSCOPIC NISSEN FUNDOPLICATION  7/67/3419  . LUMBAR LAMINECTOMY/DECOMPRESSION MICRODISCECTOMY  08/29/2010   L2-S1  . SHOULDER ARTHROSCOPY WITH ROTATOR CUFF REPAIR AND SUBACROMIAL DECOMPRESSION Right 01/01/2000  . TONSILLECTOMY     as child  . TOTAL HIP REVISION Left 07/08/2014   Procedure: TOTAL HIP REVISION;  Surgeon: Kerin Salen, MD;  Location: [facility];  Service: Orthopedics;  Laterality:  Left;  . TOTAL KNEE ARTHROPLASTY Left 05/14/2004  . TOTAL KNEE ARTHROPLASTY  12/23/2011   Procedure: TOTAL KNEE ARTHROPLASTY;  Surgeon: Lorn Junes, MD;  Location: [facility];  Service: Orthopedics;  Laterality: Right;  DR Glen THIS CASE  . TRIGGER FINGER RELEASE Right 07/21/2007   thumb  . TRIGGER FINGER RELEASE Left 09/24/2007   middle finger  . TRIGGER FINGER RELEASE Right 03/10/2008   ring and little fingers  . TRIGGER FINGER RELEASE Right 09/02/2012   Procedure: RELEASE TRIGGER FINGER/A-1 PULLEY RIGHT INDEX FINGER;  Surgeon: Wynonia Sours, MD;  Location: [facility];  Service: Orthopedics;  Laterality: Right;  . TRIGGER FINGER RELEASE Left 12/22/2012   Procedure: RELEASE TRIGGER FINGER/A-1 PULLEY LEFT RING FINGER;  Surgeon: Wynonia Sours, MD;  Location: [facility];  Service: Orthopedics;  Laterality: Left;  Left     Allergies  Allergen Reactions  . Morphine And Related Other (See Comments)    AGITATION, STRANGE DREAMS  . Scopolamine Other (See Comments)    MENTAL CHANGES  . Atorvastatin Other (See Comments)    unknown  . Morphine Other (See Comments)    Disoriented, mood changes  . Nsaids Other (See Comments)    unknown  . Pravachol [Pravastatin Sodium] Other (See Comments)    unknown  . Pravastatin Other (See Comments)    unknown  . Teriparatide Other (See Comments)    unknown    Allergies as of 12/22/2017      Reactions   Morphine And Related Other (See Comments)   AGITATION, STRANGE DREAMS   Scopolamine Other (See Comments)   MENTAL CHANGES   Atorvastatin Other (See Comments)   unknown   Morphine Other (See Comments)   Disoriented, mood changes   Nsaids Other (See Comments)   unknown   Pravachol [pravastatin Sodium] Other (See Comments)   unknown   Pravastatin Other (See Comments)   unknown   Teriparatide Other (See Comments)   unknown      Medication List        Accurate as of 12/22/17 11:59 PM. Always use your  most recent med list.          acetaminophen 500 MG tablet Commonly known as:  TYLENOL Take 500 mg by mouth every 6 (six) hours as needed for pain.   albuterol (2.5 MG/3ML) 0.083% nebulizer solution Commonly known as:  PROVENTIL Take 2.5 mg by nebulization every 4 (four) hours as needed for wheezing or shortness of breath.   ARTIFICIAL TEARS 0.1-0.3 % Soln Generic drug:  Dextran 70-Hypromellose Apply 1 drop to eye 4 (four) times daily.   aspirin EC 81 MG tablet Take 81 mg by mouth daily.   BREO ELLIPTA 100-25 MCG/INH Aepb Generic drug:  fluticasone furoate-vilanterol Inhale 1 puff into the lungs daily.   docusate sodium 100 MG capsule Commonly known as:  COLACE Take 1 capsule by mouth daily as needed for mild constipation.   feeding supplement Liqd Commonly known as:  BOOST /  RESOURCE BREEZE Take 120 mLs by mouth 3 (three) times daily between meals. For weight loss   loratadine 10 MG tablet Commonly known as:  CLARITIN Take 10 mg by mouth daily as needed for allergies.   LORazepam 0.5 MG tablet Commonly known as:  ATIVAN Take 0.25 mg by mouth 2 (two) times daily.   magnesium hydroxide 400 MG/5ML suspension Commonly known as:  MILK OF MAGNESIA Take 15 mLs by mouth at bedtime as needed for mild constipation.   memantine 10 MG tablet Commonly known as:  NAMENDA Take 10 mg by mouth 2 (two) times daily.   metoprolol tartrate 25 MG tablet Commonly known as:  LOPRESSOR Take 2 tablet = 50 mg in am and 1 tablet at bedtime   nitroGLYCERIN 0.4 MG SL tablet Commonly known as:  NITROSTAT Place 0.4 mg under the tongue every 5 (five) minutes as needed for chest pain.   omeprazole 20 MG capsule Commonly known as:  PRILOSEC Take 20 mg by mouth daily.   traMADol 50 MG tablet Commonly known as:  ULTRAM Take 50 mg by mouth every 8 (eight) hours as needed for severe pain.   traMADol 50 MG tablet Commonly known as:  ULTRAM Take 1 tablet (50 mg total) by mouth every 6 (six)  hours as needed.   venlafaxine XR 150 MG 24 hr capsule Commonly known as:  EFFEXOR-XR Take 150 mg by mouth at bedtime.   Vitamin D 2000 units Caps Take 2,000 Units by mouth daily.       Review of Systems  Constitutional: Positive for activity change and fatigue. Negative for appetite change, chills, diaphoresis and fever.  HENT: Positive for hearing loss. Negative for congestion.   Respiratory: Negative for cough, chest tightness and shortness of breath.   Cardiovascular: Negative for chest pain, palpitations and leg swelling.  Genitourinary: Negative for difficulty urinating, dysuria and urgency.  Musculoskeletal: Positive for arthralgias and gait problem.       Bruise and swelling upper left arm and left hand.   Skin: Positive for color change. Negative for pallor and rash.       Bruise left upper arm and hand.   Neurological: Negative for dizziness, facial asymmetry, speech difficulty, weakness and headaches.       Dementia.   Psychiatric/Behavioral: Positive for confusion. Negative for agitation, behavioral problems and hallucinations. The patient is not nervous/anxious.     Immunization History  Administered Date(s) Administered  . Influenza-Unspecified 04/14/2017  . Pneumococcal Polysaccharide-23 06/14/2013   Pertinent  Health Maintenance Due  Topic Date Due  . PNA vac Low Risk Adult (2 of 2 - PCV13) 06/14/2014  . INFLUENZA VACCINE  01/22/2018  . DEXA SCAN  Completed   Fall Risk  09/19/2017  Falls in the past year? No   Functional Status Survey:    Vitals:   12/22/17 1650  BP: 110/80  Pulse: 96  Resp: 18  Temp: 99.1 F (37.3 C)   There is no height or weight on file to calculate BMI. Physical Exam  Constitutional: She appears well-developed and well-nourished.  HENT:  Head: Normocephalic and atraumatic.  Eyes: Pupils are equal, round, and reactive to light. EOM are normal.  Neck: Normal range of motion. Neck supple. No JVD present. No thyromegaly  present.  Cardiovascular: Normal rate and regular rhythm.  Pulmonary/Chest: Effort normal. She has no wheezes. She has no rales.  Abdominal: Soft. Bowel sounds are normal.  Musculoskeletal: She exhibits edema and tenderness.  Left upper arm swelling, warmth,  bruised, pain in the area when palpated or with left arm movement. One person transfer, ambulates with walker.   Neurological: She is alert. No cranial nerve deficit. She exhibits normal muscle tone. Coordination normal.  Skin: Skin is warm and dry.  Entire left upper arm bruised  Psychiatric: She has a normal mood and affect. Her behavior is normal.    Labs reviewed: Recent Labs    07/30/17 1113 08/03/17 1851 08/08/17 1135  11/04/17 11/13/17 11/18/17  NA 130* 131* 130*   < > 133* 131* 131*  131*  K 4.3 4.4 4.5   < > 3.9 4.3 4.7  4.7  CL 98* 96* 96*  --  98 98 99  CO2 21* 26 22  --  26 24 26   GLUCOSE 94 96 91  --   --   --   --   BUN 14 17 11    < > 11 12 17  17   CREATININE 0.79 0.72 0.67   < > 0.6 0.6 0.6  0.6  CALCIUM 8.9 9.1 9.1  --  9.2 9.0 8.8   < > = values in this interval not displayed.   Recent Labs    07/30/17 1113 08/03/17 1851  11/04/17 11/13/17 11/18/17  AST 24 23   < > 20 18 18  18   ALT 16 16   < > 15 13 13  13   ALKPHOS 148* 142*   < > 207* 158* 156*  156*  BILITOT 0.3 0.3  --   --   --   --   PROT 6.6 7.5  --   --   --   --   ALBUMIN 3.4* 4.0  --  4.2 4.0 3.5   < > = values in this interval not displayed.   Recent Labs    07/30/17 1113 08/03/17 1851 08/08/17 1135  10/07/17 10/30/17 11/13/17  WBC 8.4 6.8 8.0   < > 5.5 5.3 5.4  NEUTROABS 5.2 3.5 4.8  --   --   --   --   HGB 12.1 13.0 13.1   < > 12.0 11.6* 12.9  HCT 35.8* 38.8 39.5   < > 34* 34* 38  MCV 96.5 97.5 97.8  --   --   --   --   PLT 325 375 318   < > 340 363 369   < > = values in this interval not displayed.   Lab Results  Component Value Date   TSH 2.99 11/13/2017   Lab Results  Component Value Date   HGBA1C 5.8 06/29/2013    No results found for: CHOL, HDL, LDLCALC, LDLDIRECT, TRIG, CHOLHDL  Significant Diagnostic Results in last 30 days:  Dg Shoulder Left  Result Date: 12/20/2017 CLINICAL DATA:  Recent fall with left shoulder pain, initial encounter EXAM: LEFT SHOULDER - 2+ VIEW COMPARISON:  06/12/2013 FINDINGS: There is a comminuted fracture in the proximal left humerus just below the surgical neck with impaction and angulation at the fracture site. The humeral head appears well seated. The underlying bony thorax is within limits. No other focal abnormality is seen. IMPRESSION: Impacted and angulated fracture of the proximal left humerus just below the surgical neck. Electronically Signed   By: Inez Catalina M.D.   On: 12/20/2017 21:04    Assessment/Plan: Humerus head fracture, left, sequela Continue Sling immobilizer, f/u Ortho 2 weeks, schedule Tylenol 650mg  q8hr x 1 week, continue prn Tramadol.   Vascular dementia Close supervision for safety, continue [facility] for  care assistance, continue Memantine 10mg  bid to preserve memory.   Gait disorder Continue to ambulate with walker, fall risk.     Family/ staff Communication: plan of care reviewed with the patient and charge nurse.   Labs/tests ordered:  None  Time spend 25 minutes.

## 2017-12-23 DIAGNOSIS — Z9181 History of falling: Secondary | ICD-10-CM | POA: Diagnosis not present

## 2017-12-23 DIAGNOSIS — D509 Iron deficiency anemia, unspecified: Secondary | ICD-10-CM | POA: Diagnosis not present

## 2017-12-23 DIAGNOSIS — K5900 Constipation, unspecified: Secondary | ICD-10-CM | POA: Diagnosis not present

## 2017-12-23 DIAGNOSIS — R5383 Other fatigue: Secondary | ICD-10-CM | POA: Diagnosis not present

## 2017-12-23 DIAGNOSIS — S42302D Unspecified fracture of shaft of humerus, left arm, subsequent encounter for fracture with routine healing: Secondary | ICD-10-CM | POA: Diagnosis not present

## 2017-12-23 DIAGNOSIS — J45901 Unspecified asthma with (acute) exacerbation: Secondary | ICD-10-CM | POA: Diagnosis not present

## 2017-12-23 DIAGNOSIS — R4189 Other symptoms and signs involving cognitive functions and awareness: Secondary | ICD-10-CM | POA: Diagnosis not present

## 2017-12-23 DIAGNOSIS — R41841 Cognitive communication deficit: Secondary | ICD-10-CM | POA: Diagnosis not present

## 2017-12-23 DIAGNOSIS — R1312 Dysphagia, oropharyngeal phase: Secondary | ICD-10-CM | POA: Diagnosis not present

## 2017-12-23 DIAGNOSIS — R531 Weakness: Secondary | ICD-10-CM | POA: Diagnosis not present

## 2017-12-23 DIAGNOSIS — Z96642 Presence of left artificial hip joint: Secondary | ICD-10-CM | POA: Diagnosis not present

## 2017-12-23 DIAGNOSIS — M79622 Pain in left upper arm: Secondary | ICD-10-CM | POA: Diagnosis not present

## 2017-12-23 DIAGNOSIS — R2681 Unsteadiness on feet: Secondary | ICD-10-CM | POA: Diagnosis not present

## 2017-12-23 DIAGNOSIS — N3080 Other cystitis without hematuria: Secondary | ICD-10-CM | POA: Diagnosis not present

## 2017-12-23 DIAGNOSIS — Z7389 Other problems related to life management difficulty: Secondary | ICD-10-CM | POA: Diagnosis not present

## 2017-12-23 DIAGNOSIS — I10 Essential (primary) hypertension: Secondary | ICD-10-CM | POA: Diagnosis not present

## 2017-12-23 DIAGNOSIS — B965 Pseudomonas (aeruginosa) (mallei) (pseudomallei) as the cause of diseases classified elsewhere: Secondary | ICD-10-CM | POA: Diagnosis not present

## 2017-12-23 DIAGNOSIS — M6281 Muscle weakness (generalized): Secondary | ICD-10-CM | POA: Diagnosis not present

## 2017-12-23 DIAGNOSIS — B0239 Other herpes zoster eye disease: Secondary | ICD-10-CM | POA: Diagnosis not present

## 2017-12-23 DIAGNOSIS — N390 Urinary tract infection, site not specified: Secondary | ICD-10-CM | POA: Diagnosis not present

## 2017-12-23 NOTE — Assessment & Plan Note (Signed)
Continue to ambulate with walker, fall risk.

## 2017-12-23 NOTE — Assessment & Plan Note (Signed)
Close supervision for safety, continue SNF [facility] for care assistance, continue Memantine 10mg  bid to preserve memory.

## 2017-12-24 DIAGNOSIS — H3530 Unspecified macular degeneration: Secondary | ICD-10-CM | POA: Diagnosis not present

## 2017-12-24 DIAGNOSIS — Z961 Presence of intraocular lens: Secondary | ICD-10-CM | POA: Diagnosis not present

## 2017-12-24 DIAGNOSIS — S42302D Unspecified fracture of shaft of humerus, left arm, subsequent encounter for fracture with routine healing: Secondary | ICD-10-CM | POA: Diagnosis not present

## 2017-12-24 DIAGNOSIS — M79622 Pain in left upper arm: Secondary | ICD-10-CM | POA: Diagnosis not present

## 2017-12-24 DIAGNOSIS — M6281 Muscle weakness (generalized): Secondary | ICD-10-CM | POA: Diagnosis not present

## 2017-12-24 DIAGNOSIS — Z9181 History of falling: Secondary | ICD-10-CM | POA: Diagnosis not present

## 2017-12-24 DIAGNOSIS — H0221C Cicatricial lagophthalmos, bilateral, upper and lower eyelids: Secondary | ICD-10-CM | POA: Diagnosis not present

## 2017-12-24 DIAGNOSIS — R2681 Unsteadiness on feet: Secondary | ICD-10-CM | POA: Diagnosis not present

## 2017-12-24 DIAGNOSIS — H04123 Dry eye syndrome of bilateral lacrimal glands: Secondary | ICD-10-CM | POA: Diagnosis not present

## 2017-12-24 DIAGNOSIS — H40003 Preglaucoma, unspecified, bilateral: Secondary | ICD-10-CM | POA: Diagnosis not present

## 2017-12-24 DIAGNOSIS — Z7389 Other problems related to life management difficulty: Secondary | ICD-10-CM | POA: Diagnosis not present

## 2017-12-24 DIAGNOSIS — Z947 Corneal transplant status: Secondary | ICD-10-CM | POA: Diagnosis not present

## 2017-12-25 DIAGNOSIS — Z9181 History of falling: Secondary | ICD-10-CM | POA: Diagnosis not present

## 2017-12-25 DIAGNOSIS — R2681 Unsteadiness on feet: Secondary | ICD-10-CM | POA: Diagnosis not present

## 2017-12-25 DIAGNOSIS — M6281 Muscle weakness (generalized): Secondary | ICD-10-CM | POA: Diagnosis not present

## 2017-12-25 DIAGNOSIS — Z7389 Other problems related to life management difficulty: Secondary | ICD-10-CM | POA: Diagnosis not present

## 2017-12-25 DIAGNOSIS — S42302D Unspecified fracture of shaft of humerus, left arm, subsequent encounter for fracture with routine healing: Secondary | ICD-10-CM | POA: Diagnosis not present

## 2017-12-25 DIAGNOSIS — M79622 Pain in left upper arm: Secondary | ICD-10-CM | POA: Diagnosis not present

## 2017-12-26 DIAGNOSIS — R2681 Unsteadiness on feet: Secondary | ICD-10-CM | POA: Diagnosis not present

## 2017-12-26 DIAGNOSIS — M6281 Muscle weakness (generalized): Secondary | ICD-10-CM | POA: Diagnosis not present

## 2017-12-26 DIAGNOSIS — S42302D Unspecified fracture of shaft of humerus, left arm, subsequent encounter for fracture with routine healing: Secondary | ICD-10-CM | POA: Diagnosis not present

## 2017-12-26 DIAGNOSIS — Z9181 History of falling: Secondary | ICD-10-CM | POA: Diagnosis not present

## 2017-12-26 DIAGNOSIS — M79622 Pain in left upper arm: Secondary | ICD-10-CM | POA: Diagnosis not present

## 2017-12-26 DIAGNOSIS — Z7389 Other problems related to life management difficulty: Secondary | ICD-10-CM | POA: Diagnosis not present

## 2017-12-29 ENCOUNTER — Encounter: Payer: Self-pay | Admitting: Nurse Practitioner

## 2017-12-29 ENCOUNTER — Non-Acute Institutional Stay (SKILLED_NURSING_FACILITY): Payer: Medicare Other | Admitting: Nurse Practitioner

## 2017-12-29 DIAGNOSIS — K219 Gastro-esophageal reflux disease without esophagitis: Secondary | ICD-10-CM

## 2017-12-29 DIAGNOSIS — S42292S Other displaced fracture of upper end of left humerus, sequela: Secondary | ICD-10-CM | POA: Diagnosis not present

## 2017-12-29 DIAGNOSIS — F418 Other specified anxiety disorders: Secondary | ICD-10-CM | POA: Diagnosis not present

## 2017-12-29 DIAGNOSIS — I10 Essential (primary) hypertension: Secondary | ICD-10-CM

## 2017-12-29 DIAGNOSIS — D509 Iron deficiency anemia, unspecified: Secondary | ICD-10-CM | POA: Diagnosis not present

## 2017-12-29 DIAGNOSIS — Z7389 Other problems related to life management difficulty: Secondary | ICD-10-CM | POA: Diagnosis not present

## 2017-12-29 DIAGNOSIS — F0150 Vascular dementia without behavioral disturbance: Secondary | ICD-10-CM

## 2017-12-29 DIAGNOSIS — R2681 Unsteadiness on feet: Secondary | ICD-10-CM | POA: Diagnosis not present

## 2017-12-29 DIAGNOSIS — S42302D Unspecified fracture of shaft of humerus, left arm, subsequent encounter for fracture with routine healing: Secondary | ICD-10-CM | POA: Diagnosis not present

## 2017-12-29 DIAGNOSIS — M6281 Muscle weakness (generalized): Secondary | ICD-10-CM | POA: Diagnosis not present

## 2017-12-29 DIAGNOSIS — Z9181 History of falling: Secondary | ICD-10-CM | POA: Diagnosis not present

## 2017-12-29 DIAGNOSIS — M79622 Pain in left upper arm: Secondary | ICD-10-CM | POA: Diagnosis not present

## 2017-12-29 NOTE — Assessment & Plan Note (Signed)
Resides in SNF [facility], needs assistance with transfer, self propels w/c to get around, continue Memantine for memory.

## 2017-12-29 NOTE — Assessment & Plan Note (Signed)
Continue sling left arm, continue Tylenol 650mg  q8hr po x 1 week, f/u ortho.

## 2017-12-29 NOTE — Progress Notes (Deleted)
.  Kristin Solomon

## 2017-12-29 NOTE — Assessment & Plan Note (Signed)
Continue Fe 325mg  qd, last Hgb 12.9 11/13/17. Observe.

## 2017-12-29 NOTE — Progress Notes (Signed)
Hx o Location:  [facility] Room Number: 42-B Place of Service:  SNF (31) Provider: Lennie Odor Jeff Mccallum NP  Blanchie Serve, MD  Patient Care Team: Blanchie Serve, MD as PCP - General (Internal Medicine) [name], MD as Referring Physician (Ophthalmology)  Extended Emergency Contact Information Primary Emergency Contact: [name] Address: [address]          [place], [place] [address] [name] of [place] Phone: [phone] Mobile Phone: [phone] Relation: Son Secondary Emergency Contact: [name] Address: [address]          [place], [place] [address] [name] of [place] Phone: [phone] Mobile Phone: [phone] Relation: Son  Code Status:  DNR Goals of care: Advanced Directive information Advanced Directives 12/29/2017  Does Patient Have a Medical Advance Directive? Yes  Type of Advance Directive Out of facility DNR (pink MOST or yellow form)  Does patient want to make changes to medical advance directive? No - Patient declined  Copy of [place] in Chart? -  Would patient like information on creating a medical advance directive? -  Pre-existing out of facility DNR order (yellow form or pink MOST form) Yellow form placed in chart (order not valid for inpatient use)     Chief Complaint  Patient presents with  . Medical Management of Chronic Issues    F/U- (L) humerus fx, gait disorder, vascular dementia    HPI:  Pt is a 84 y.o. female seen today for medical management of chronic diseases.     The patient has history of dementia, resides in SNF [facility] for care assistance, one person transfer, self propels w/c to get around, on Memantine for memory. She had frequent falls and fxs, the most recent fx was left humerus head 12/20/17, now left arm is immobilized in sling, pain is managed with Tylenol, didn't tolerate Tramadol. Her mood is managed on Effexor 150mg  qd, Lorazepam 0.25mg  bid. IDA,  stable, Hgb 12s, on Fe. GERD stable on Omeprazole 20mg  daily. Blood pressure is controlled on Metoprolol 50mg  qd, 25mg  qd.  Past Medical History:  Diagnosis Date  . Anemia, iron deficiency    takes Ferrous Sulfate daily  . Depression    takes Effexor daily  . Gait disorder 04/21/2014  . GERD (gastroesophageal reflux disease)    takes Omeprazole daily  . Herpes ocular    history of-takes Acyclovir daily  . High cholesterol    takes Atorvastatin daily  . History of hiatal hernia   . Hypertension    takes Metoprolol daily  . Insomnia    takes Xanax nightly  . Osteoarthritis of knee    bilateral  . Sciatic pain    Past Surgical History:  Procedure Laterality Date  . ABDOMINAL HYSTERECTOMY  1995   partial  . CARPAL TUNNEL RELEASE Right 07/21/2007  . CARPAL TUNNEL RELEASE Left 09/24/2007  . COLONOSCOPY    . DESCEMETS STRIPPING AUTOMATED ENDOTHELIAL KERATOPLASTY Left 03/14/2011  . DESCEMETS STRIPPING AUTOMATED ENDOTHELIAL KERATOPLASTY Right 08/20/2012  . EYE SURGERY Bilateral    cataract surgery  . EYE SURGERY Left    corneal transplant  . HERNIA REPAIR    . HIP ARTHROPLASTY Left 06/13/2013   Procedure: ARTHROPLASTY BIPOLAR HIP; Injection left shoulder;  Surgeon: Johnny Bridge, MD;  Location: [facility];  Service: Orthopedics;  Laterality: Left;  . JOINT REPLACEMENT     bilateral knees, left knee  . KNEE ARTHROSCOPY Right 05/11/2001  . KYPHOPLASTY N/A 04/10/2015   Procedure: T11 Kyphoplasty;  Surgeon: Jovita Gamma, MD;  Location: [facility] NEURO ORS;  Service: Neurosurgery;  Laterality: N/A;  T11 Kyphoplasty  . KYPHOPLASTY N/A 05/06/2015   Procedure: KYPHOPLASTY Thoracic twelve;  Surgeon: Jovita Gamma, MD;  Location: [facility] NEURO ORS;  Service: Neurosurgery;  Laterality: N/A;  T12 Kyphoplasty  . LAPAROSCOPIC NISSEN FUNDOPLICATION  0/03/9322  . LUMBAR LAMINECTOMY/DECOMPRESSION MICRODISCECTOMY  08/29/2010   L2-S1  . SHOULDER ARTHROSCOPY WITH ROTATOR CUFF REPAIR AND SUBACROMIAL DECOMPRESSION  Right 01/01/2000  . TONSILLECTOMY     as child  . TOTAL HIP REVISION Left 07/08/2014   Procedure: TOTAL HIP REVISION;  Surgeon: Kerin Salen, MD;  Location: [facility];  Service: Orthopedics;  Laterality: Left;  . TOTAL KNEE ARTHROPLASTY Left 05/14/2004  . TOTAL KNEE ARTHROPLASTY  12/23/2011   Procedure: TOTAL KNEE ARTHROPLASTY;  Surgeon: Lorn Junes, MD;  Location: [facility];  Service: Orthopedics;  Laterality: Right;  DR Lindsey THIS CASE  . TRIGGER FINGER RELEASE Right 07/21/2007   thumb  . TRIGGER FINGER RELEASE Left 09/24/2007   middle finger  . TRIGGER FINGER RELEASE Right 03/10/2008   ring and little fingers  . TRIGGER FINGER RELEASE Right 09/02/2012   Procedure: RELEASE TRIGGER FINGER/A-1 PULLEY RIGHT INDEX FINGER;  Surgeon: Wynonia Sours, MD;  Location: [facility];  Service: Orthopedics;  Laterality: Right;  . TRIGGER FINGER RELEASE Left 12/22/2012   Procedure: RELEASE TRIGGER FINGER/A-1 PULLEY LEFT RING FINGER;  Surgeon: Wynonia Sours, MD;  Location: [facility];  Service: Orthopedics;  Laterality: Left;  Left     Allergies  Allergen Reactions  . Morphine And Related Other (See Comments)    AGITATION, STRANGE DREAMS  . Scopolamine Other (See Comments)    MENTAL CHANGES  . Atorvastatin Other (See Comments)    unknown  . Morphine Other (See Comments)    Disoriented, mood changes  . Nsaids Other (See Comments)    unknown  . Pravachol [Pravastatin Sodium] Other (See Comments)    unknown  . Pravastatin Other (See Comments)    unknown  . Teriparatide Other (See Comments)    unknown    Outpatient Encounter Medications as of 12/29/2017  Medication Sig  . acetaminophen (TYLENOL) 325 MG tablet Take 650 mg by mouth every 8 (eight) hours.  Marland Kitchen albuterol (PROVENTIL) (2.5 MG/3ML) 0.083% nebulizer solution Take 2.5 mg by nebulization every 4 (four) hours as needed for wheezing or shortness of breath.  Marland Kitchen aspirin EC 81 MG tablet Take 81 mg by mouth  daily.  . Cholecalciferol (VITAMIN D) 2000 units CAPS Take 2,000 Units by mouth daily.  Marland Kitchen Dextran 70-Hypromellose (ARTIFICIAL TEARS) 0.1-0.3 % SOLN Apply 1 drop to eye 4 (four) times daily.  Marland Kitchen docusate sodium (COLACE) 100 MG capsule Take 1 capsule by mouth daily as needed for mild constipation.   . feeding supplement (BOOST / RESOURCE BREEZE) LIQD Take 120 mLs by mouth 3 (three) times daily between meals. For weight loss  . ferrous sulfate 325 (65 FE) MG tablet Take 325 mg by mouth daily.  . fluticasone furoate-vilanterol (BREO ELLIPTA) 100-25 MCG/INH AEPB Inhale 1 puff into the lungs daily.  Marland Kitchen loratadine (CLARITIN) 10 MG tablet Take 10 mg by mouth daily as needed for allergies.  Marland Kitchen LORazepam (ATIVAN) 0.5 MG tablet Take 0.25 mg by mouth 2 (two) times daily.   . magnesium hydroxide (MILK OF MAGNESIA) 400 MG/5ML suspension Take 15 mLs by mouth at bedtime as needed for mild constipation.   . memantine (NAMENDA)  10 MG tablet Take 10 mg by mouth 2 (two) times daily.  . metoprolol tartrate (LOPRESSOR) 25 MG tablet Take 2 tablet = 50 mg in am and 1 tablet at bedtime (Patient taking differently: Take 50 mg by mouth every morning. Take 2 tablet = 50 mg in am and 1 tablet at bedtime)  . nitroGLYCERIN (NITROSTAT) 0.4 MG SL tablet Place 0.4 mg under the tongue every 5 (five) minutes as needed for chest pain.  Marland Kitchen omeprazole (PRILOSEC) 20 MG capsule Take 20 mg by mouth daily.   Marland Kitchen venlafaxine XR (EFFEXOR-XR) 150 MG 24 hr capsule Take 150 mg by mouth at bedtime.   . [DISCONTINUED] acetaminophen (TYLENOL) 500 MG tablet Take 500 mg by mouth every 6 (six) hours as needed.   . [DISCONTINUED] traMADol (ULTRAM) 50 MG tablet Take 50 mg by mouth every 8 (eight) hours as needed for severe pain.   . [DISCONTINUED] traMADol (ULTRAM) 50 MG tablet Take 1 tablet (50 mg total) by mouth every 6 (six) hours as needed.  . [DISCONTINUED] ferrous sulfate tablet 325 mg    No facility-administered encounter medications on file as of  12/29/2017.    ROS was provided with assistance of staff Review of Systems  Constitutional: Negative for activity change, appetite change, chills, diaphoresis, fatigue and fever.  HENT: Positive for hearing loss. Negative for congestion and voice change.   Respiratory: Negative for cough, shortness of breath and wheezing.   Cardiovascular: Negative for chest pain, palpitations and leg swelling.  Gastrointestinal: Negative for abdominal distention, abdominal pain, constipation, diarrhea, nausea and vomiting.  Genitourinary: Negative for difficulty urinating, dysuria and urgency.  Musculoskeletal: Positive for arthralgias and gait problem.  Skin: Positive for color change. Negative for pallor.  Neurological: Negative for dizziness, speech difficulty, weakness and headaches.       Dementia  Psychiatric/Behavioral: Positive for confusion. Negative for agitation, behavioral problems and hallucinations. The patient is not nervous/anxious.     Immunization History  Administered Date(s) Administered  . Influenza-Unspecified 04/14/2017  . Pneumococcal Polysaccharide-23 06/14/2013   Pertinent  Health Maintenance Due  Topic Date Due  . PNA vac Low Risk Adult (2 of 2 - PCV13) 06/14/2014  . INFLUENZA VACCINE  01/22/2018  . DEXA SCAN  Completed   Fall Risk  09/19/2017  Falls in the past year? No   Functional Status Survey:    Vitals:   12/29/17 1157  BP: 126/70  Pulse: 68  Resp: 20  Temp: 98 F (36.7 C)  SpO2: 96%  Weight: 117 lb 9.6 oz (53.3 kg)  Height: 5\' 4"  (1.626 m)   Body mass index is 20.19 kg/m. Physical Exam  Constitutional: She appears well-developed and well-nourished.  HENT:  Head: Normocephalic and atraumatic.  Eyes: Pupils are equal, round, and reactive to light. EOM are normal.  Neck: Normal range of motion. Neck supple. No JVD present. No thyromegaly present.  Cardiovascular: Normal rate and regular rhythm.  No murmur heard. Pulmonary/Chest: She has no wheezes.  She has no rales.  Abdominal: Soft. Bowel sounds are normal. She exhibits no distension and no mass. There is no tenderness.  Musculoskeletal: She exhibits tenderness. She exhibits no edema.  Left upper arm pain with movement, in sling. One person transfer, self propels w/c to get around.   Neurological: She is alert. No cranial nerve deficit. She exhibits normal muscle tone. Coordination normal.  Oriented to person and place.   Skin: Skin is warm and dry.  Left upper arm bruise, swelling.   Psychiatric:  She has a normal mood and affect. Her behavior is normal.    Labs reviewed: Recent Labs    07/30/17 1113 08/03/17 1851 08/08/17 1135  11/04/17 11/13/17 11/18/17  NA 130* 131* 130*   < > 133* 131* 131*  131*  K 4.3 4.4 4.5   < > 3.9 4.3 4.7  4.7  CL 98* 96* 96*  --  98 98 99  CO2 21* 26 22  --  26 24 26   GLUCOSE 94 96 91  --   --   --   --   BUN 14 17 11    < > 11 12 17  17   CREATININE 0.79 0.72 0.67   < > 0.6 0.6 0.6  0.6  CALCIUM 8.9 9.1 9.1  --  9.2 9.0 8.8   < > = values in this interval not displayed.   Recent Labs    07/30/17 1113 08/03/17 1851  11/04/17 11/13/17 11/18/17  AST 24 23   < > 20 18 18  18   ALT 16 16   < > 15 13 13  13   ALKPHOS 148* 142*   < > 207* 158* 156*  156*  BILITOT 0.3 0.3  --   --   --   --   PROT 6.6 7.5  --   --   --   --   ALBUMIN 3.4* 4.0  --  4.2 4.0 3.5   < > = values in this interval not displayed.   Recent Labs    07/30/17 1113 08/03/17 1851 08/08/17 1135  10/07/17 10/30/17 11/13/17  WBC 8.4 6.8 8.0   < > 5.5 5.3 5.4  NEUTROABS 5.2 3.5 4.8  --   --   --   --   HGB 12.1 13.0 13.1   < > 12.0 11.6* 12.9  HCT 35.8* 38.8 39.5   < > 34* 34* 38  MCV 96.5 97.5 97.8  --   --   --   --   PLT 325 375 318   < > 340 363 369   < > = values in this interval not displayed.   Lab Results  Component Value Date   TSH 2.99 11/13/2017   Lab Results  Component Value Date   HGBA1C 5.8 06/29/2013   No results found for: CHOL, HDL, LDLCALC,  LDLDIRECT, TRIG, CHOLHDL  Significant Diagnostic Results in last 30 days:  Dg Shoulder Left  Result Date: 12/20/2017 CLINICAL DATA:  Recent fall with left shoulder pain, initial encounter EXAM: LEFT SHOULDER - 2+ VIEW COMPARISON:  06/12/2013 FINDINGS: There is a comminuted fracture in the proximal left humerus just below the surgical neck with impaction and angulation at the fracture site. The humeral head appears well seated. The underlying bony thorax is within limits. No other focal abnormality is seen. IMPRESSION: Impacted and angulated fracture of the proximal left humerus just below the surgical neck. Electronically Signed   By: Inez Catalina M.D.   On: 12/20/2017 21:04    Assessment/Plan Patient Active Problem List   Diagnosis Date Noted  . Humerus head fracture, left, sequela 12/22/2017  . Dry eyes, bilateral 12/04/2017  . Fatigue 11/11/2017  . Elevated serum alkaline phosphatase level 11/04/2017  . Urinary frequency 10/06/2017  . Vascular dementia 10/06/2017  . Fall 10/02/2017  . Nasal bone fracture 10/01/2017  . Closed fracture of left olecranon process 09/02/2017  . Oropharyngeal dysphagia 09/02/2017  . Advance care planning 09/02/2017  . Chronic bronchitis ([place]) 09/02/2017  . Olecranon  fracture 08/28/2017  . Metacarpal bone fracture 08/28/2017  . Hyponatremia 08/22/2017  . T12 compression fracture ([place]) 05/06/2015  . Wedge compression fracture of T11 vertebra ([place]) 04/10/2015  . Left hip pain 07/13/2014  . GERD (gastroesophageal reflux disease) 07/13/2014  . Dry mouth 07/13/2014  . 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 Gait disorder 04/21/2014  . Constipation 06/21/2013  . Depression with anxiety 06/21/2013  . Diverticulosis of colon without hemorrhage 06/13/2013  . S/P Nissen fundoplication (without gastrostomy tube) procedure 06/13/2013  . Lactose intolerance 06/13/2013  . PVCs (premature ventricular contractions) 06/13/2013  . Fracture of femoral neck, left  ([facility]) 06/12/2013  . Hip fracture requiring operative repair ([place]) 06/12/2013  . Closed left hip fracture ([place]) 06/12/2013  . Leukocytosis, unspecified 06/12/2013  . Postoperative anemia due to acute blood loss 12/25/2011  . Lumbar radiculopathy, chronic 12/23/2011  . Hyperlipidemia   . Hypertension   . Tachycardia   . Herpes ocular   . Iron deficiency anemia   . Right knee DJD   . Status post total knee replacement    A+P: Hypertension Blood pressure is controlled on Metoprolol 25mg  qd, 50mg  qd.   Vascular dementia Resides in SNF [facility], needs assistance with transfer, self propels w/c to get around, continue Memantine for memory.   Humerus head fracture, left, sequela Continue sling left arm, continue Tylenol 650mg  q8hr po x 1 week, f/u ortho.   GERD (gastroesophageal reflux disease) Stable, continue Omeprazole 20mg  qd.   Iron deficiency anemia Continue Fe 325mg  qd, last Hgb 12.9 11/13/17. Observe.   Depression with anxiety Mood is stable, continue Effexor and Lorazepam.     Family/ staff Communication: plan of care reviewed with the patient and charge nurse.   Labs/tests ordered:  none  Time spend 25 minutes.

## 2017-12-29 NOTE — Assessment & Plan Note (Signed)
Stable, continue Omeprazole 20mg qd.  

## 2017-12-29 NOTE — Assessment & Plan Note (Signed)
Mood is stable, continue Effexor and Lorazepam.

## 2017-12-29 NOTE — Assessment & Plan Note (Addendum)
Blood pressure is controlled on Metoprolol 25mg  qd, 50mg  qd.

## 2017-12-30 DIAGNOSIS — M6281 Muscle weakness (generalized): Secondary | ICD-10-CM | POA: Diagnosis not present

## 2017-12-30 DIAGNOSIS — Z9181 History of falling: Secondary | ICD-10-CM | POA: Diagnosis not present

## 2017-12-30 DIAGNOSIS — R2681 Unsteadiness on feet: Secondary | ICD-10-CM | POA: Diagnosis not present

## 2017-12-30 DIAGNOSIS — Z7389 Other problems related to life management difficulty: Secondary | ICD-10-CM | POA: Diagnosis not present

## 2017-12-30 DIAGNOSIS — S42302D Unspecified fracture of shaft of humerus, left arm, subsequent encounter for fracture with routine healing: Secondary | ICD-10-CM | POA: Diagnosis not present

## 2017-12-30 DIAGNOSIS — S42202A Unspecified fracture of upper end of left humerus, initial encounter for closed fracture: Secondary | ICD-10-CM | POA: Diagnosis not present

## 2017-12-30 DIAGNOSIS — M79622 Pain in left upper arm: Secondary | ICD-10-CM | POA: Diagnosis not present

## 2017-12-31 DIAGNOSIS — Z9181 History of falling: Secondary | ICD-10-CM | POA: Diagnosis not present

## 2017-12-31 DIAGNOSIS — M79622 Pain in left upper arm: Secondary | ICD-10-CM | POA: Diagnosis not present

## 2017-12-31 DIAGNOSIS — R2681 Unsteadiness on feet: Secondary | ICD-10-CM | POA: Diagnosis not present

## 2017-12-31 DIAGNOSIS — M6281 Muscle weakness (generalized): Secondary | ICD-10-CM | POA: Diagnosis not present

## 2017-12-31 DIAGNOSIS — Z7389 Other problems related to life management difficulty: Secondary | ICD-10-CM | POA: Diagnosis not present

## 2017-12-31 DIAGNOSIS — S42302D Unspecified fracture of shaft of humerus, left arm, subsequent encounter for fracture with routine healing: Secondary | ICD-10-CM | POA: Diagnosis not present

## 2018-01-01 DIAGNOSIS — Z9181 History of falling: Secondary | ICD-10-CM | POA: Diagnosis not present

## 2018-01-01 DIAGNOSIS — R2681 Unsteadiness on feet: Secondary | ICD-10-CM | POA: Diagnosis not present

## 2018-01-01 DIAGNOSIS — M79622 Pain in left upper arm: Secondary | ICD-10-CM | POA: Diagnosis not present

## 2018-01-01 DIAGNOSIS — Z7389 Other problems related to life management difficulty: Secondary | ICD-10-CM | POA: Diagnosis not present

## 2018-01-01 DIAGNOSIS — M6281 Muscle weakness (generalized): Secondary | ICD-10-CM | POA: Diagnosis not present

## 2018-01-01 DIAGNOSIS — S42302D Unspecified fracture of shaft of humerus, left arm, subsequent encounter for fracture with routine healing: Secondary | ICD-10-CM | POA: Diagnosis not present

## 2018-01-02 DIAGNOSIS — S42302D Unspecified fracture of shaft of humerus, left arm, subsequent encounter for fracture with routine healing: Secondary | ICD-10-CM | POA: Diagnosis not present

## 2018-01-02 DIAGNOSIS — Z9181 History of falling: Secondary | ICD-10-CM | POA: Diagnosis not present

## 2018-01-02 DIAGNOSIS — M79622 Pain in left upper arm: Secondary | ICD-10-CM | POA: Diagnosis not present

## 2018-01-02 DIAGNOSIS — M6281 Muscle weakness (generalized): Secondary | ICD-10-CM | POA: Diagnosis not present

## 2018-01-02 DIAGNOSIS — Z7389 Other problems related to life management difficulty: Secondary | ICD-10-CM | POA: Diagnosis not present

## 2018-01-02 DIAGNOSIS — R2681 Unsteadiness on feet: Secondary | ICD-10-CM | POA: Diagnosis not present

## 2018-01-05 DIAGNOSIS — R2681 Unsteadiness on feet: Secondary | ICD-10-CM | POA: Diagnosis not present

## 2018-01-05 DIAGNOSIS — M79622 Pain in left upper arm: Secondary | ICD-10-CM | POA: Diagnosis not present

## 2018-01-05 DIAGNOSIS — Z7389 Other problems related to life management difficulty: Secondary | ICD-10-CM | POA: Diagnosis not present

## 2018-01-05 DIAGNOSIS — M6281 Muscle weakness (generalized): Secondary | ICD-10-CM | POA: Diagnosis not present

## 2018-01-05 DIAGNOSIS — Z9181 History of falling: Secondary | ICD-10-CM | POA: Diagnosis not present

## 2018-01-05 DIAGNOSIS — S42302D Unspecified fracture of shaft of humerus, left arm, subsequent encounter for fracture with routine healing: Secondary | ICD-10-CM | POA: Diagnosis not present

## 2018-01-06 DIAGNOSIS — M6281 Muscle weakness (generalized): Secondary | ICD-10-CM | POA: Diagnosis not present

## 2018-01-06 DIAGNOSIS — R2681 Unsteadiness on feet: Secondary | ICD-10-CM | POA: Diagnosis not present

## 2018-01-06 DIAGNOSIS — Z7389 Other problems related to life management difficulty: Secondary | ICD-10-CM | POA: Diagnosis not present

## 2018-01-06 DIAGNOSIS — M79622 Pain in left upper arm: Secondary | ICD-10-CM | POA: Diagnosis not present

## 2018-01-06 DIAGNOSIS — Z9181 History of falling: Secondary | ICD-10-CM | POA: Diagnosis not present

## 2018-01-06 DIAGNOSIS — S42302D Unspecified fracture of shaft of humerus, left arm, subsequent encounter for fracture with routine healing: Secondary | ICD-10-CM | POA: Diagnosis not present

## 2018-01-07 DIAGNOSIS — M79622 Pain in left upper arm: Secondary | ICD-10-CM | POA: Diagnosis not present

## 2018-01-07 DIAGNOSIS — M6281 Muscle weakness (generalized): Secondary | ICD-10-CM | POA: Diagnosis not present

## 2018-01-07 DIAGNOSIS — Z7389 Other problems related to life management difficulty: Secondary | ICD-10-CM | POA: Diagnosis not present

## 2018-01-07 DIAGNOSIS — S42302D Unspecified fracture of shaft of humerus, left arm, subsequent encounter for fracture with routine healing: Secondary | ICD-10-CM | POA: Diagnosis not present

## 2018-01-07 DIAGNOSIS — R2681 Unsteadiness on feet: Secondary | ICD-10-CM | POA: Diagnosis not present

## 2018-01-07 DIAGNOSIS — Z9181 History of falling: Secondary | ICD-10-CM | POA: Diagnosis not present

## 2018-01-08 DIAGNOSIS — Z9181 History of falling: Secondary | ICD-10-CM | POA: Diagnosis not present

## 2018-01-08 DIAGNOSIS — M6281 Muscle weakness (generalized): Secondary | ICD-10-CM | POA: Diagnosis not present

## 2018-01-08 DIAGNOSIS — R2681 Unsteadiness on feet: Secondary | ICD-10-CM | POA: Diagnosis not present

## 2018-01-08 DIAGNOSIS — Z7389 Other problems related to life management difficulty: Secondary | ICD-10-CM | POA: Diagnosis not present

## 2018-01-08 DIAGNOSIS — S42302D Unspecified fracture of shaft of humerus, left arm, subsequent encounter for fracture with routine healing: Secondary | ICD-10-CM | POA: Diagnosis not present

## 2018-01-08 DIAGNOSIS — M79622 Pain in left upper arm: Secondary | ICD-10-CM | POA: Diagnosis not present

## 2018-01-09 DIAGNOSIS — M6281 Muscle weakness (generalized): Secondary | ICD-10-CM | POA: Diagnosis not present

## 2018-01-09 DIAGNOSIS — S42302D Unspecified fracture of shaft of humerus, left arm, subsequent encounter for fracture with routine healing: Secondary | ICD-10-CM | POA: Diagnosis not present

## 2018-01-09 DIAGNOSIS — R2681 Unsteadiness on feet: Secondary | ICD-10-CM | POA: Diagnosis not present

## 2018-01-09 DIAGNOSIS — M79622 Pain in left upper arm: Secondary | ICD-10-CM | POA: Diagnosis not present

## 2018-01-09 DIAGNOSIS — Z9181 History of falling: Secondary | ICD-10-CM | POA: Diagnosis not present

## 2018-01-09 DIAGNOSIS — Z7389 Other problems related to life management difficulty: Secondary | ICD-10-CM | POA: Diagnosis not present

## 2018-01-12 DIAGNOSIS — R2681 Unsteadiness on feet: Secondary | ICD-10-CM | POA: Diagnosis not present

## 2018-01-12 DIAGNOSIS — M79622 Pain in left upper arm: Secondary | ICD-10-CM | POA: Diagnosis not present

## 2018-01-12 DIAGNOSIS — Z9181 History of falling: Secondary | ICD-10-CM | POA: Diagnosis not present

## 2018-01-12 DIAGNOSIS — S42302D Unspecified fracture of shaft of humerus, left arm, subsequent encounter for fracture with routine healing: Secondary | ICD-10-CM | POA: Diagnosis not present

## 2018-01-12 DIAGNOSIS — Z7389 Other problems related to life management difficulty: Secondary | ICD-10-CM | POA: Diagnosis not present

## 2018-01-12 DIAGNOSIS — M6281 Muscle weakness (generalized): Secondary | ICD-10-CM | POA: Diagnosis not present

## 2018-01-13 DIAGNOSIS — R2681 Unsteadiness on feet: Secondary | ICD-10-CM | POA: Diagnosis not present

## 2018-01-13 DIAGNOSIS — S42302D Unspecified fracture of shaft of humerus, left arm, subsequent encounter for fracture with routine healing: Secondary | ICD-10-CM | POA: Diagnosis not present

## 2018-01-13 DIAGNOSIS — M6281 Muscle weakness (generalized): Secondary | ICD-10-CM | POA: Diagnosis not present

## 2018-01-13 DIAGNOSIS — Z9181 History of falling: Secondary | ICD-10-CM | POA: Diagnosis not present

## 2018-01-13 DIAGNOSIS — M79622 Pain in left upper arm: Secondary | ICD-10-CM | POA: Diagnosis not present

## 2018-01-13 DIAGNOSIS — Z7389 Other problems related to life management difficulty: Secondary | ICD-10-CM | POA: Diagnosis not present

## 2018-01-14 DIAGNOSIS — Z9181 History of falling: Secondary | ICD-10-CM | POA: Diagnosis not present

## 2018-01-14 DIAGNOSIS — M6281 Muscle weakness (generalized): Secondary | ICD-10-CM | POA: Diagnosis not present

## 2018-01-14 DIAGNOSIS — M79622 Pain in left upper arm: Secondary | ICD-10-CM | POA: Diagnosis not present

## 2018-01-14 DIAGNOSIS — R2681 Unsteadiness on feet: Secondary | ICD-10-CM | POA: Diagnosis not present

## 2018-01-14 DIAGNOSIS — Z7389 Other problems related to life management difficulty: Secondary | ICD-10-CM | POA: Diagnosis not present

## 2018-01-14 DIAGNOSIS — S42302D Unspecified fracture of shaft of humerus, left arm, subsequent encounter for fracture with routine healing: Secondary | ICD-10-CM | POA: Diagnosis not present

## 2018-01-15 DIAGNOSIS — M6281 Muscle weakness (generalized): Secondary | ICD-10-CM | POA: Diagnosis not present

## 2018-01-15 DIAGNOSIS — R2681 Unsteadiness on feet: Secondary | ICD-10-CM | POA: Diagnosis not present

## 2018-01-15 DIAGNOSIS — Z7389 Other problems related to life management difficulty: Secondary | ICD-10-CM | POA: Diagnosis not present

## 2018-01-15 DIAGNOSIS — S42302D Unspecified fracture of shaft of humerus, left arm, subsequent encounter for fracture with routine healing: Secondary | ICD-10-CM | POA: Diagnosis not present

## 2018-01-15 DIAGNOSIS — Z9181 History of falling: Secondary | ICD-10-CM | POA: Diagnosis not present

## 2018-01-15 DIAGNOSIS — M79622 Pain in left upper arm: Secondary | ICD-10-CM | POA: Diagnosis not present

## 2018-01-16 DIAGNOSIS — M79622 Pain in left upper arm: Secondary | ICD-10-CM | POA: Diagnosis not present

## 2018-01-16 DIAGNOSIS — S42302D Unspecified fracture of shaft of humerus, left arm, subsequent encounter for fracture with routine healing: Secondary | ICD-10-CM | POA: Diagnosis not present

## 2018-01-16 DIAGNOSIS — M6281 Muscle weakness (generalized): Secondary | ICD-10-CM | POA: Diagnosis not present

## 2018-01-16 DIAGNOSIS — Z7389 Other problems related to life management difficulty: Secondary | ICD-10-CM | POA: Diagnosis not present

## 2018-01-16 DIAGNOSIS — R2681 Unsteadiness on feet: Secondary | ICD-10-CM | POA: Diagnosis not present

## 2018-01-16 DIAGNOSIS — Z9181 History of falling: Secondary | ICD-10-CM | POA: Diagnosis not present

## 2018-01-19 DIAGNOSIS — M79622 Pain in left upper arm: Secondary | ICD-10-CM | POA: Diagnosis not present

## 2018-01-19 DIAGNOSIS — M6281 Muscle weakness (generalized): Secondary | ICD-10-CM | POA: Diagnosis not present

## 2018-01-19 DIAGNOSIS — Z9181 History of falling: Secondary | ICD-10-CM | POA: Diagnosis not present

## 2018-01-19 DIAGNOSIS — S42302D Unspecified fracture of shaft of humerus, left arm, subsequent encounter for fracture with routine healing: Secondary | ICD-10-CM | POA: Diagnosis not present

## 2018-01-19 DIAGNOSIS — R2681 Unsteadiness on feet: Secondary | ICD-10-CM | POA: Diagnosis not present

## 2018-01-19 DIAGNOSIS — Z7389 Other problems related to life management difficulty: Secondary | ICD-10-CM | POA: Diagnosis not present

## 2018-01-20 DIAGNOSIS — Z9181 History of falling: Secondary | ICD-10-CM | POA: Diagnosis not present

## 2018-01-20 DIAGNOSIS — M79622 Pain in left upper arm: Secondary | ICD-10-CM | POA: Diagnosis not present

## 2018-01-20 DIAGNOSIS — S42302D Unspecified fracture of shaft of humerus, left arm, subsequent encounter for fracture with routine healing: Secondary | ICD-10-CM | POA: Diagnosis not present

## 2018-01-20 DIAGNOSIS — Z7389 Other problems related to life management difficulty: Secondary | ICD-10-CM | POA: Diagnosis not present

## 2018-01-20 DIAGNOSIS — M6281 Muscle weakness (generalized): Secondary | ICD-10-CM | POA: Diagnosis not present

## 2018-01-20 DIAGNOSIS — R2681 Unsteadiness on feet: Secondary | ICD-10-CM | POA: Diagnosis not present

## 2018-01-22 DIAGNOSIS — N390 Urinary tract infection, site not specified: Secondary | ICD-10-CM | POA: Diagnosis not present

## 2018-01-22 DIAGNOSIS — M79622 Pain in left upper arm: Secondary | ICD-10-CM | POA: Diagnosis not present

## 2018-01-22 DIAGNOSIS — B0239 Other herpes zoster eye disease: Secondary | ICD-10-CM | POA: Diagnosis not present

## 2018-01-22 DIAGNOSIS — Z7389 Other problems related to life management difficulty: Secondary | ICD-10-CM | POA: Diagnosis not present

## 2018-01-22 DIAGNOSIS — Z9181 History of falling: Secondary | ICD-10-CM | POA: Diagnosis not present

## 2018-01-22 DIAGNOSIS — J42 Unspecified chronic bronchitis: Secondary | ICD-10-CM | POA: Diagnosis not present

## 2018-01-22 DIAGNOSIS — M6281 Muscle weakness (generalized): Secondary | ICD-10-CM | POA: Diagnosis not present

## 2018-01-22 DIAGNOSIS — K5900 Constipation, unspecified: Secondary | ICD-10-CM | POA: Diagnosis not present

## 2018-01-22 DIAGNOSIS — F418 Other specified anxiety disorders: Secondary | ICD-10-CM | POA: Diagnosis not present

## 2018-01-22 DIAGNOSIS — R4189 Other symptoms and signs involving cognitive functions and awareness: Secondary | ICD-10-CM | POA: Diagnosis not present

## 2018-01-22 DIAGNOSIS — N3080 Other cystitis without hematuria: Secondary | ICD-10-CM | POA: Diagnosis not present

## 2018-01-22 DIAGNOSIS — R5383 Other fatigue: Secondary | ICD-10-CM | POA: Diagnosis not present

## 2018-01-22 DIAGNOSIS — M25552 Pain in left hip: Secondary | ICD-10-CM | POA: Diagnosis not present

## 2018-01-22 DIAGNOSIS — S42302D Unspecified fracture of shaft of humerus, left arm, subsequent encounter for fracture with routine healing: Secondary | ICD-10-CM | POA: Diagnosis not present

## 2018-01-22 DIAGNOSIS — I10 Essential (primary) hypertension: Secondary | ICD-10-CM | POA: Diagnosis not present

## 2018-01-22 DIAGNOSIS — D509 Iron deficiency anemia, unspecified: Secondary | ICD-10-CM | POA: Diagnosis not present

## 2018-01-22 DIAGNOSIS — J45901 Unspecified asthma with (acute) exacerbation: Secondary | ICD-10-CM | POA: Diagnosis not present

## 2018-01-22 DIAGNOSIS — R531 Weakness: Secondary | ICD-10-CM | POA: Diagnosis not present

## 2018-01-22 DIAGNOSIS — H04123 Dry eye syndrome of bilateral lacrimal glands: Secondary | ICD-10-CM | POA: Diagnosis not present

## 2018-01-22 DIAGNOSIS — B965 Pseudomonas (aeruginosa) (mallei) (pseudomallei) as the cause of diseases classified elsewhere: Secondary | ICD-10-CM | POA: Diagnosis not present

## 2018-01-22 DIAGNOSIS — F329 Major depressive disorder, single episode, unspecified: Secondary | ICD-10-CM | POA: Diagnosis not present

## 2018-01-22 DIAGNOSIS — R2681 Unsteadiness on feet: Secondary | ICD-10-CM | POA: Diagnosis not present

## 2018-01-23 DIAGNOSIS — M79622 Pain in left upper arm: Secondary | ICD-10-CM | POA: Diagnosis not present

## 2018-01-23 DIAGNOSIS — S42302D Unspecified fracture of shaft of humerus, left arm, subsequent encounter for fracture with routine healing: Secondary | ICD-10-CM | POA: Diagnosis not present

## 2018-01-23 DIAGNOSIS — M6281 Muscle weakness (generalized): Secondary | ICD-10-CM | POA: Diagnosis not present

## 2018-01-23 DIAGNOSIS — R2681 Unsteadiness on feet: Secondary | ICD-10-CM | POA: Diagnosis not present

## 2018-01-23 DIAGNOSIS — Z7389 Other problems related to life management difficulty: Secondary | ICD-10-CM | POA: Diagnosis not present

## 2018-01-23 DIAGNOSIS — Z9181 History of falling: Secondary | ICD-10-CM | POA: Diagnosis not present

## 2018-01-26 DIAGNOSIS — M79622 Pain in left upper arm: Secondary | ICD-10-CM | POA: Diagnosis not present

## 2018-01-26 DIAGNOSIS — Z9181 History of falling: Secondary | ICD-10-CM | POA: Diagnosis not present

## 2018-01-26 DIAGNOSIS — S42302D Unspecified fracture of shaft of humerus, left arm, subsequent encounter for fracture with routine healing: Secondary | ICD-10-CM | POA: Diagnosis not present

## 2018-01-26 DIAGNOSIS — M6281 Muscle weakness (generalized): Secondary | ICD-10-CM | POA: Diagnosis not present

## 2018-01-26 DIAGNOSIS — R2681 Unsteadiness on feet: Secondary | ICD-10-CM | POA: Diagnosis not present

## 2018-01-26 DIAGNOSIS — Z7389 Other problems related to life management difficulty: Secondary | ICD-10-CM | POA: Diagnosis not present

## 2018-01-27 DIAGNOSIS — S42202D Unspecified fracture of upper end of left humerus, subsequent encounter for fracture with routine healing: Secondary | ICD-10-CM | POA: Diagnosis not present

## 2018-01-28 ENCOUNTER — Non-Acute Institutional Stay (SKILLED_NURSING_FACILITY): Payer: Medicare Other | Admitting: Nurse Practitioner

## 2018-01-28 ENCOUNTER — Encounter: Payer: Self-pay | Admitting: Nurse Practitioner

## 2018-01-28 DIAGNOSIS — Z9181 History of falling: Secondary | ICD-10-CM | POA: Diagnosis not present

## 2018-01-28 DIAGNOSIS — I10 Essential (primary) hypertension: Secondary | ICD-10-CM

## 2018-01-28 DIAGNOSIS — D509 Iron deficiency anemia, unspecified: Secondary | ICD-10-CM

## 2018-01-28 DIAGNOSIS — W19XXXD Unspecified fall, subsequent encounter: Secondary | ICD-10-CM

## 2018-01-28 DIAGNOSIS — F418 Other specified anxiety disorders: Secondary | ICD-10-CM | POA: Diagnosis not present

## 2018-01-28 DIAGNOSIS — R2681 Unsteadiness on feet: Secondary | ICD-10-CM | POA: Diagnosis not present

## 2018-01-28 DIAGNOSIS — F0150 Vascular dementia without behavioral disturbance: Secondary | ICD-10-CM

## 2018-01-28 DIAGNOSIS — S42292S Other displaced fracture of upper end of left humerus, sequela: Secondary | ICD-10-CM | POA: Diagnosis not present

## 2018-01-28 DIAGNOSIS — S42302D Unspecified fracture of shaft of humerus, left arm, subsequent encounter for fracture with routine healing: Secondary | ICD-10-CM | POA: Diagnosis not present

## 2018-01-28 DIAGNOSIS — M6281 Muscle weakness (generalized): Secondary | ICD-10-CM | POA: Diagnosis not present

## 2018-01-28 DIAGNOSIS — M79622 Pain in left upper arm: Secondary | ICD-10-CM | POA: Diagnosis not present

## 2018-01-28 DIAGNOSIS — Z7389 Other problems related to life management difficulty: Secondary | ICD-10-CM | POA: Diagnosis not present

## 2018-01-28 NOTE — Progress Notes (Signed)
Location:  [facility] Room Number: 42-B Place of Service:  SNF (31) Provider:  Kilee Hedding, ManXie  NP  Blanchie Serve, MD  Patient Care Team: Blanchie Serve, MD as PCP - General (Internal Medicine) [name], MD as Referring Physician (Ophthalmology)  Extended Emergency Contact Information Primary Emergency Contact: [name] Address: [address]          [place], [place] [address] [name] of [place] Phone: [phone] Mobile Phone: [phone] Relation: Son Secondary Emergency Contact: [name] Address: [address]          [place], [place] [address] [name] of [place] Phone: [phone] Mobile Phone: [phone] Relation: Son  Code Status:  DNR Goals of care: Advanced Directive information Advanced Directives 01/28/2018  Does Patient Have a Medical Advance Directive? Yes  Type of Advance Directive Out of facility DNR (pink MOST or yellow form)  Does patient want to make changes to medical advance directive? No - Patient declined  Copy of [place] in Chart? -  Would patient like information on creating a medical advance directive? -  Pre-existing out of facility DNR order (yellow form or pink MOST form) Yellow form placed in chart (order not valid for inpatient use)     Chief Complaint  Patient presents with  . Medical Management of Chronic Issues    F/u- HTN, dementia, GERD,     HPI:  Pt is a 84 y.o. female seen today for medical management of chronic diseases.    The patient has frequent falls due to her lack of safety awareness related to dementia and physical frailty. Her left humerus fracture sustained from falling is healing nicely, last seen ortho 01/27/18: continue sling, ROM Left shoulder. She takes Memantine 10mg  bid for memory. Hx IDA is stable, last Hgb 12s, on Iron daily. Hx of depression, mood is stable on Venlafaxine 150mg  qhs, Lorazepam 0.25mg  bid. HTN blood pressure is  controlled on Metoprolol 50mg  qd, 25mg  qd.   Past Medical History:  Diagnosis Date  . Anemia, iron deficiency    takes Ferrous Sulfate daily  . Depression    takes Effexor daily  . Gait disorder 04/21/2014  . GERD (gastroesophageal reflux disease)    takes Omeprazole daily  . Herpes ocular    history of-takes Acyclovir daily  . High cholesterol    takes Atorvastatin daily  . History of hiatal hernia   . Hypertension    takes Metoprolol daily  . Insomnia    takes Xanax nightly  . Osteoarthritis of knee    bilateral  . Sciatic pain    Past Surgical History:  Procedure Laterality Date  . ABDOMINAL HYSTERECTOMY  1995   partial  . CARPAL TUNNEL RELEASE Right 07/21/2007  . CARPAL TUNNEL RELEASE Left 09/24/2007  . COLONOSCOPY    . DESCEMETS STRIPPING AUTOMATED ENDOTHELIAL KERATOPLASTY Left 03/14/2011  . DESCEMETS STRIPPING AUTOMATED ENDOTHELIAL KERATOPLASTY Right 08/20/2012  . EYE SURGERY Bilateral    cataract surgery  . EYE SURGERY Left    corneal transplant  . HERNIA REPAIR    . HIP ARTHROPLASTY Left 06/13/2013   Procedure: ARTHROPLASTY BIPOLAR HIP; Injection left shoulder;  Surgeon: Johnny Bridge, MD;  Location: [facility];  Service: Orthopedics;  Laterality: Left;  . JOINT REPLACEMENT     bilateral knees, left knee  . KNEE ARTHROSCOPY Right 05/11/2001  . KYPHOPLASTY N/A 04/10/2015   Procedure: T11 Kyphoplasty;  Surgeon: Jovita Gamma, MD;  Location: [facility];  Service: Neurosurgery;  Laterality: N/A;  T11 Kyphoplasty  . KYPHOPLASTY N/A 05/06/2015   Procedure: KYPHOPLASTY Thoracic twelve;  Surgeon: Jovita Gamma, MD;  Location: [facility];  Service: Neurosurgery;  Laterality: N/A;  T12 Kyphoplasty  . LAPAROSCOPIC NISSEN FUNDOPLICATION  6/43/3295  . LUMBAR LAMINECTOMY/DECOMPRESSION MICRODISCECTOMY  08/29/2010   L2-S1  . SHOULDER ARTHROSCOPY WITH ROTATOR CUFF REPAIR AND SUBACROMIAL DECOMPRESSION Right 01/01/2000  . TONSILLECTOMY     as child  . TOTAL HIP REVISION Left  07/08/2014   Procedure: TOTAL HIP REVISION;  Surgeon: Kerin Salen, MD;  Location: [facility];  Service: Orthopedics;  Laterality: Left;  . TOTAL KNEE ARTHROPLASTY Left 05/14/2004  . TOTAL KNEE ARTHROPLASTY  12/23/2011   Procedure: TOTAL KNEE ARTHROPLASTY;  Surgeon: Lorn Junes, MD;  Location: [facility];  Service: Orthopedics;  Laterality: Right;  DR [place] THIS CASE  . TRIGGER FINGER RELEASE Right 07/21/2007   thumb  . TRIGGER FINGER RELEASE Left 09/24/2007   middle finger  . TRIGGER FINGER RELEASE Right 03/10/2008   ring and little fingers  . TRIGGER FINGER RELEASE Right 09/02/2012   Procedure: RELEASE TRIGGER FINGER/A-1 PULLEY RIGHT INDEX FINGER;  Surgeon: Wynonia Sours, MD;  Location: [facility];  Service: Orthopedics;  Laterality: Right;  . TRIGGER FINGER RELEASE Left 12/22/2012   Procedure: RELEASE TRIGGER FINGER/A-1 PULLEY LEFT RING FINGER;  Surgeon: Wynonia Sours, MD;  Location: [facility];  Service: Orthopedics;  Laterality: Left;  Left     Allergies  Allergen Reactions  . Morphine And Related Other (See Comments)    AGITATION, STRANGE DREAMS  . Scopolamine Other (See Comments)    MENTAL CHANGES  . Atorvastatin Other (See Comments)    unknown  . Morphine Other (See Comments)    Disoriented, mood changes  . Nsaids Other (See Comments)    unknown  . Pravachol [Pravastatin Sodium] Other (See Comments)    unknown  . Pravastatin Other (See Comments)    unknown  . Teriparatide Other (See Comments)    unknown    Outpatient Encounter Medications as of 01/28/2018  Medication Sig  . acetaminophen (TYLENOL) 500 MG tablet Take 500 mg by mouth every 6 (six) hours as needed for mild pain or moderate pain.  Marland Kitchen albuterol (PROVENTIL) (2.5 MG/3ML) 0.083% nebulizer solution Take 2.5 mg by nebulization every 4 (four) hours as needed for wheezing or shortness of breath.  Marland Kitchen aspirin EC 81 MG tablet Take 81 mg by mouth daily.  . Cholecalciferol (VITAMIN D)  2000 units CAPS Take 2,000 Units by mouth daily.  Marland Kitchen Dextran 70-Hypromellose (ARTIFICIAL TEARS) 0.1-0.3 % SOLN Apply 1 drop to eye 4 (four) times daily.  Marland Kitchen docusate sodium (COLACE) 100 MG capsule Take 1 capsule by mouth daily as needed for mild constipation.   . feeding supplement (BOOST / RESOURCE BREEZE) LIQD Take 120 mLs by mouth 3 (three) times daily between meals. For weight loss  . ferrous sulfate 325 (65 FE) MG tablet Take 325 mg by mouth daily.  . fluticasone furoate-vilanterol (BREO ELLIPTA) 100-25 MCG/INH AEPB Inhale 1 puff into the lungs daily.  Marland Kitchen loratadine (CLARITIN) 10 MG tablet Take 10 mg by mouth daily as needed for allergies.  Marland Kitchen LORazepam (ATIVAN) 0.5 MG tablet Take 0.25 mg by mouth 2 (two) times daily.   . magnesium hydroxide (MILK OF MAGNESIA) 400 MG/5ML suspension Take 15 mLs by mouth at bedtime as needed for mild constipation.   . memantine (NAMENDA) 10 MG tablet Take 10  mg by mouth 2 (two) times daily.  . metoprolol tartrate (LOPRESSOR) 25 MG tablet Take 2 tablet = 50 mg in am and 1 tablet at bedtime (Patient taking differently: Take 25 mg by mouth every morning. Take 2 tablet = 50 mg in am and 1 tablet at bedtime)  . nitroGLYCERIN (NITROSTAT) 0.4 MG SL tablet Place 0.4 mg under the tongue every 5 (five) minutes as needed for chest pain.  Marland Kitchen omeprazole (PRILOSEC) 20 MG capsule Take 20 mg by mouth daily.   Marland Kitchen venlafaxine XR (EFFEXOR-XR) 150 MG 24 hr capsule Take 150 mg by mouth at bedtime.   . [DISCONTINUED] acetaminophen (TYLENOL) 325 MG tablet Take 325 mg by mouth every 6 (six) hours as needed for mild pain or moderate pain.    No facility-administered encounter medications on file as of 01/28/2018.    ROS was provided with assistance of staff Review of Systems  Constitutional: Negative for activity change, appetite change, chills, diaphoresis and fatigue.  HENT: Positive for hearing loss. Negative for congestion and voice change.   Respiratory: Negative for cough, shortness  of breath and wheezing.   Cardiovascular: Negative for chest pain, palpitations and leg swelling.  Gastrointestinal: Negative for abdominal distention, abdominal pain, constipation, diarrhea, nausea and vomiting.  Genitourinary: Negative for difficulty urinating, dysuria and urgency.  Musculoskeletal: Positive for arthralgias and gait problem.  Skin: Negative for color change and pallor.  Neurological: Negative for dizziness, speech difficulty and headaches.       Dementia  Psychiatric/Behavioral: Positive for confusion. Negative for agitation, behavioral problems, hallucinations and sleep disturbance. The patient is not nervous/anxious.     Immunization History  Administered Date(s) Administered  . Influenza-Unspecified 04/14/2017  . Pneumococcal Polysaccharide-23 06/14/2013   Pertinent  Health Maintenance Due  Topic Date Due  . PNA vac Low Risk Adult (2 of 2 - PCV13) 06/14/2014  . INFLUENZA VACCINE  01/22/2018  . DEXA SCAN  Completed   Fall Risk  09/19/2017  Falls in the past year? No   Functional Status Survey:    Vitals:   01/28/18 1441  BP: 130/60  Pulse: 84  Resp: 20  Temp: 98 F (36.7 C)  Weight: 115 lb 12.8 oz (52.5 kg)  Height: 5\' 4"  (1.626 m)   Body mass index is 19.88 kg/m. Physical Exam  Constitutional: She appears well-developed and well-nourished.  HENT:  Head: Normocephalic and atraumatic.  Eyes: Pupils are equal, round, and reactive to light. EOM are normal.  Neck: Normal range of motion. Neck supple. No JVD present. No thyromegaly present.  Cardiovascular: Normal rate and regular rhythm.  No murmur heard. Pulmonary/Chest: Effort normal. She has no wheezes. She has no rales.  Abdominal: Soft. Bowel sounds are normal. She exhibits no distension. There is no tenderness.  Musculoskeletal: She exhibits no edema.  Reduced ROM of the left shoulder. Needs assistance with walker, w/c for mobility.   Neurological: She is alert. No cranial nerve deficit. She  exhibits normal muscle tone. Coordination normal.  Oriented to person and place  Skin: Skin is warm and dry.  Psychiatric: She has a normal mood and affect. Her behavior is normal.    Labs reviewed: Recent Labs    07/30/17 1113 08/03/17 1851 08/08/17 1135  11/04/17 11/13/17 11/18/17  NA 130* 131* 130*   < > 133* 131* 131*  131*  K 4.3 4.4 4.5   < > 3.9 4.3 4.7  4.7  CL 98* 96* 96*  --  98 98 99  CO2 21*  26 22  --  26 24 26   GLUCOSE 94 96 91  --   --   --   --   BUN 14 17 11    < > 11 12 17  17   CREATININE 0.79 0.72 0.67   < > 0.6 0.6 0.6  0.6  CALCIUM 8.9 9.1 9.1  --  9.2 9.0 8.8   < > = values in this interval not displayed.   Recent Labs    07/30/17 1113 08/03/17 1851  11/04/17 11/13/17 11/18/17  AST 24 23   < > 20 18 18  18   ALT 16 16   < > 15 13 13  13   ALKPHOS 148* 142*   < > 207* 158* 156*  156*  BILITOT 0.3 0.3  --   --   --   --   PROT 6.6 7.5  --   --   --   --   ALBUMIN 3.4* 4.0  --  4.2 4.0 3.5   < > = values in this interval not displayed.   Recent Labs    07/30/17 1113 08/03/17 1851 08/08/17 1135  10/07/17 10/30/17 11/13/17  WBC 8.4 6.8 8.0   < > 5.5 5.3 5.4  NEUTROABS 5.2 3.5 4.8  --   --   --   --   HGB 12.1 13.0 13.1   < > 12.0 11.6* 12.9  HCT 35.8* 38.8 39.5   < > 34* 34* 38  MCV 96.5 97.5 97.8  --   --   --   --   PLT 325 375 318   < > 340 363 369   < > = values in this interval not displayed.   Lab Results  Component Value Date   TSH 2.99 11/13/2017   Lab Results  Component Value Date   HGBA1C 5.8 06/29/2013   No results found for: CHOL, HDL, LDLCALC, LDLDIRECT, TRIG, CHOLHDL  Significant Diagnostic Results in last 30 days:  No results found.  Assessment/Plan Hypertension Controlled blood pressures, continue Metoprolol 50mg  qd, 25mg  qd.   Vascular dementia Continue SNF [facility] for care assistance, continue Memantine for memory, close supervision for safety, risk for falling.   Humerus head fracture, left, sequela Her left  humerus fracture sustained from falling is healing nicely, last seen ortho 01/27/18: continue sling, ROM Left shoulder.  Fall Risk of falling, close supervision for safety   Depression with anxiety Mood is stable, continue Venlafaxine 150mg  qhs, Lorazepam 0.25mg  bid.   Iron deficiency anemia Stable, Hgb 12s, continue daily Iron supplement.      Family/ staff Communication: plan of care reviewed with the patient and charge nurse.    Labs/tests ordered:  none  Time spend 25 minutes.

## 2018-01-28 NOTE — Assessment & Plan Note (Signed)
Risk of falling, close supervision for safety

## 2018-01-28 NOTE — Assessment & Plan Note (Signed)
Stable, Hgb 12s, continue daily Iron supplement.

## 2018-01-28 NOTE — Assessment & Plan Note (Signed)
Continue SNF [facility] for care assistance, continue Memantine for memory, close supervision for safety, risk for falling.

## 2018-01-28 NOTE — Assessment & Plan Note (Signed)
Mood is stable, continue Venlafaxine 150mg  qhs, Lorazepam 0.25mg  bid.

## 2018-01-28 NOTE — Assessment & Plan Note (Signed)
Controlled blood pressures, continue Metoprolol 50mg  qd, 25mg  qd.

## 2018-01-28 NOTE — Assessment & Plan Note (Addendum)
Her left humerus fracture sustained from falling is healing nicely, last seen ortho 01/27/18: continue sling, ROM Left shoulder.

## 2018-01-30 DIAGNOSIS — M6281 Muscle weakness (generalized): Secondary | ICD-10-CM | POA: Diagnosis not present

## 2018-01-30 DIAGNOSIS — M79622 Pain in left upper arm: Secondary | ICD-10-CM | POA: Diagnosis not present

## 2018-01-30 DIAGNOSIS — S42302D Unspecified fracture of shaft of humerus, left arm, subsequent encounter for fracture with routine healing: Secondary | ICD-10-CM | POA: Diagnosis not present

## 2018-01-30 DIAGNOSIS — Z9181 History of falling: Secondary | ICD-10-CM | POA: Diagnosis not present

## 2018-01-30 DIAGNOSIS — R2681 Unsteadiness on feet: Secondary | ICD-10-CM | POA: Diagnosis not present

## 2018-01-30 DIAGNOSIS — Z7389 Other problems related to life management difficulty: Secondary | ICD-10-CM | POA: Diagnosis not present

## 2018-02-02 DIAGNOSIS — R2681 Unsteadiness on feet: Secondary | ICD-10-CM | POA: Diagnosis not present

## 2018-02-02 DIAGNOSIS — M79622 Pain in left upper arm: Secondary | ICD-10-CM | POA: Diagnosis not present

## 2018-02-02 DIAGNOSIS — Z9181 History of falling: Secondary | ICD-10-CM | POA: Diagnosis not present

## 2018-02-02 DIAGNOSIS — Z7389 Other problems related to life management difficulty: Secondary | ICD-10-CM | POA: Diagnosis not present

## 2018-02-02 DIAGNOSIS — M6281 Muscle weakness (generalized): Secondary | ICD-10-CM | POA: Diagnosis not present

## 2018-02-02 DIAGNOSIS — S42302D Unspecified fracture of shaft of humerus, left arm, subsequent encounter for fracture with routine healing: Secondary | ICD-10-CM | POA: Diagnosis not present

## 2018-02-04 ENCOUNTER — Encounter: Payer: Self-pay | Admitting: Internal Medicine

## 2018-02-04 DIAGNOSIS — Z9181 History of falling: Secondary | ICD-10-CM | POA: Diagnosis not present

## 2018-02-04 DIAGNOSIS — R2681 Unsteadiness on feet: Secondary | ICD-10-CM | POA: Diagnosis not present

## 2018-02-04 DIAGNOSIS — M79622 Pain in left upper arm: Secondary | ICD-10-CM | POA: Diagnosis not present

## 2018-02-04 DIAGNOSIS — S42302D Unspecified fracture of shaft of humerus, left arm, subsequent encounter for fracture with routine healing: Secondary | ICD-10-CM | POA: Diagnosis not present

## 2018-02-04 DIAGNOSIS — M6281 Muscle weakness (generalized): Secondary | ICD-10-CM | POA: Diagnosis not present

## 2018-02-04 DIAGNOSIS — Z7389 Other problems related to life management difficulty: Secondary | ICD-10-CM | POA: Diagnosis not present

## 2018-02-05 DIAGNOSIS — Z7389 Other problems related to life management difficulty: Secondary | ICD-10-CM | POA: Diagnosis not present

## 2018-02-05 DIAGNOSIS — M79622 Pain in left upper arm: Secondary | ICD-10-CM | POA: Diagnosis not present

## 2018-02-05 DIAGNOSIS — M6281 Muscle weakness (generalized): Secondary | ICD-10-CM | POA: Diagnosis not present

## 2018-02-05 DIAGNOSIS — R2681 Unsteadiness on feet: Secondary | ICD-10-CM | POA: Diagnosis not present

## 2018-02-05 DIAGNOSIS — S42302D Unspecified fracture of shaft of humerus, left arm, subsequent encounter for fracture with routine healing: Secondary | ICD-10-CM | POA: Diagnosis not present

## 2018-02-05 DIAGNOSIS — Z9181 History of falling: Secondary | ICD-10-CM | POA: Diagnosis not present

## 2018-02-06 DIAGNOSIS — Z9181 History of falling: Secondary | ICD-10-CM | POA: Diagnosis not present

## 2018-02-06 DIAGNOSIS — Z7389 Other problems related to life management difficulty: Secondary | ICD-10-CM | POA: Diagnosis not present

## 2018-02-06 DIAGNOSIS — S42302D Unspecified fracture of shaft of humerus, left arm, subsequent encounter for fracture with routine healing: Secondary | ICD-10-CM | POA: Diagnosis not present

## 2018-02-06 DIAGNOSIS — M79622 Pain in left upper arm: Secondary | ICD-10-CM | POA: Diagnosis not present

## 2018-02-06 DIAGNOSIS — R2681 Unsteadiness on feet: Secondary | ICD-10-CM | POA: Diagnosis not present

## 2018-02-06 DIAGNOSIS — M6281 Muscle weakness (generalized): Secondary | ICD-10-CM | POA: Diagnosis not present

## 2018-02-09 DIAGNOSIS — Z7389 Other problems related to life management difficulty: Secondary | ICD-10-CM | POA: Diagnosis not present

## 2018-02-09 DIAGNOSIS — Z9181 History of falling: Secondary | ICD-10-CM | POA: Diagnosis not present

## 2018-02-09 DIAGNOSIS — M6281 Muscle weakness (generalized): Secondary | ICD-10-CM | POA: Diagnosis not present

## 2018-02-09 DIAGNOSIS — R2681 Unsteadiness on feet: Secondary | ICD-10-CM | POA: Diagnosis not present

## 2018-02-09 DIAGNOSIS — M79622 Pain in left upper arm: Secondary | ICD-10-CM | POA: Diagnosis not present

## 2018-02-09 DIAGNOSIS — S42302D Unspecified fracture of shaft of humerus, left arm, subsequent encounter for fracture with routine healing: Secondary | ICD-10-CM | POA: Diagnosis not present

## 2018-02-10 DIAGNOSIS — Z7389 Other problems related to life management difficulty: Secondary | ICD-10-CM | POA: Diagnosis not present

## 2018-02-10 DIAGNOSIS — M79622 Pain in left upper arm: Secondary | ICD-10-CM | POA: Diagnosis not present

## 2018-02-10 DIAGNOSIS — Z9181 History of falling: Secondary | ICD-10-CM | POA: Diagnosis not present

## 2018-02-10 DIAGNOSIS — R2681 Unsteadiness on feet: Secondary | ICD-10-CM | POA: Diagnosis not present

## 2018-02-10 DIAGNOSIS — S42302D Unspecified fracture of shaft of humerus, left arm, subsequent encounter for fracture with routine healing: Secondary | ICD-10-CM | POA: Diagnosis not present

## 2018-02-10 DIAGNOSIS — M6281 Muscle weakness (generalized): Secondary | ICD-10-CM | POA: Diagnosis not present

## 2018-02-11 DIAGNOSIS — M6281 Muscle weakness (generalized): Secondary | ICD-10-CM | POA: Diagnosis not present

## 2018-02-11 DIAGNOSIS — Z7389 Other problems related to life management difficulty: Secondary | ICD-10-CM | POA: Diagnosis not present

## 2018-02-11 DIAGNOSIS — Z9181 History of falling: Secondary | ICD-10-CM | POA: Diagnosis not present

## 2018-02-11 DIAGNOSIS — S42302D Unspecified fracture of shaft of humerus, left arm, subsequent encounter for fracture with routine healing: Secondary | ICD-10-CM | POA: Diagnosis not present

## 2018-02-11 DIAGNOSIS — M79622 Pain in left upper arm: Secondary | ICD-10-CM | POA: Diagnosis not present

## 2018-02-11 DIAGNOSIS — R2681 Unsteadiness on feet: Secondary | ICD-10-CM | POA: Diagnosis not present

## 2018-02-13 DIAGNOSIS — S42302D Unspecified fracture of shaft of humerus, left arm, subsequent encounter for fracture with routine healing: Secondary | ICD-10-CM | POA: Diagnosis not present

## 2018-02-13 DIAGNOSIS — M79622 Pain in left upper arm: Secondary | ICD-10-CM | POA: Diagnosis not present

## 2018-02-13 DIAGNOSIS — Z9181 History of falling: Secondary | ICD-10-CM | POA: Diagnosis not present

## 2018-02-13 DIAGNOSIS — R2681 Unsteadiness on feet: Secondary | ICD-10-CM | POA: Diagnosis not present

## 2018-02-13 DIAGNOSIS — M6281 Muscle weakness (generalized): Secondary | ICD-10-CM | POA: Diagnosis not present

## 2018-02-13 DIAGNOSIS — Z7389 Other problems related to life management difficulty: Secondary | ICD-10-CM | POA: Diagnosis not present

## 2018-02-16 DIAGNOSIS — Z9181 History of falling: Secondary | ICD-10-CM | POA: Diagnosis not present

## 2018-02-16 DIAGNOSIS — S42302D Unspecified fracture of shaft of humerus, left arm, subsequent encounter for fracture with routine healing: Secondary | ICD-10-CM | POA: Diagnosis not present

## 2018-02-16 DIAGNOSIS — M6281 Muscle weakness (generalized): Secondary | ICD-10-CM | POA: Diagnosis not present

## 2018-02-16 DIAGNOSIS — M79622 Pain in left upper arm: Secondary | ICD-10-CM | POA: Diagnosis not present

## 2018-02-16 DIAGNOSIS — Z7389 Other problems related to life management difficulty: Secondary | ICD-10-CM | POA: Diagnosis not present

## 2018-02-16 DIAGNOSIS — R2681 Unsteadiness on feet: Secondary | ICD-10-CM | POA: Diagnosis not present

## 2018-02-18 DIAGNOSIS — M6281 Muscle weakness (generalized): Secondary | ICD-10-CM | POA: Diagnosis not present

## 2018-02-18 DIAGNOSIS — Z7389 Other problems related to life management difficulty: Secondary | ICD-10-CM | POA: Diagnosis not present

## 2018-02-18 DIAGNOSIS — Z9181 History of falling: Secondary | ICD-10-CM | POA: Diagnosis not present

## 2018-02-18 DIAGNOSIS — R2681 Unsteadiness on feet: Secondary | ICD-10-CM | POA: Diagnosis not present

## 2018-02-18 DIAGNOSIS — S42302D Unspecified fracture of shaft of humerus, left arm, subsequent encounter for fracture with routine healing: Secondary | ICD-10-CM | POA: Diagnosis not present

## 2018-02-18 DIAGNOSIS — M79622 Pain in left upper arm: Secondary | ICD-10-CM | POA: Diagnosis not present

## 2018-02-19 DIAGNOSIS — M6281 Muscle weakness (generalized): Secondary | ICD-10-CM | POA: Diagnosis not present

## 2018-02-19 DIAGNOSIS — R2681 Unsteadiness on feet: Secondary | ICD-10-CM | POA: Diagnosis not present

## 2018-02-19 DIAGNOSIS — Z9181 History of falling: Secondary | ICD-10-CM | POA: Diagnosis not present

## 2018-02-19 DIAGNOSIS — M79622 Pain in left upper arm: Secondary | ICD-10-CM | POA: Diagnosis not present

## 2018-02-19 DIAGNOSIS — S42302D Unspecified fracture of shaft of humerus, left arm, subsequent encounter for fracture with routine healing: Secondary | ICD-10-CM | POA: Diagnosis not present

## 2018-02-19 DIAGNOSIS — Z7389 Other problems related to life management difficulty: Secondary | ICD-10-CM | POA: Diagnosis not present

## 2018-02-20 DIAGNOSIS — R2681 Unsteadiness on feet: Secondary | ICD-10-CM | POA: Diagnosis not present

## 2018-02-20 DIAGNOSIS — Z9181 History of falling: Secondary | ICD-10-CM | POA: Diagnosis not present

## 2018-02-20 DIAGNOSIS — Z7389 Other problems related to life management difficulty: Secondary | ICD-10-CM | POA: Diagnosis not present

## 2018-02-20 DIAGNOSIS — M79622 Pain in left upper arm: Secondary | ICD-10-CM | POA: Diagnosis not present

## 2018-02-20 DIAGNOSIS — S42302D Unspecified fracture of shaft of humerus, left arm, subsequent encounter for fracture with routine healing: Secondary | ICD-10-CM | POA: Diagnosis not present

## 2018-02-20 DIAGNOSIS — M6281 Muscle weakness (generalized): Secondary | ICD-10-CM | POA: Diagnosis not present

## 2018-02-23 DIAGNOSIS — B965 Pseudomonas (aeruginosa) (mallei) (pseudomallei) as the cause of diseases classified elsewhere: Secondary | ICD-10-CM | POA: Diagnosis not present

## 2018-02-23 DIAGNOSIS — M6281 Muscle weakness (generalized): Secondary | ICD-10-CM | POA: Diagnosis not present

## 2018-02-23 DIAGNOSIS — Z9181 History of falling: Secondary | ICD-10-CM | POA: Diagnosis not present

## 2018-02-23 DIAGNOSIS — K5900 Constipation, unspecified: Secondary | ICD-10-CM | POA: Diagnosis not present

## 2018-02-23 DIAGNOSIS — H04123 Dry eye syndrome of bilateral lacrimal glands: Secondary | ICD-10-CM | POA: Diagnosis not present

## 2018-02-23 DIAGNOSIS — N390 Urinary tract infection, site not specified: Secondary | ICD-10-CM | POA: Diagnosis not present

## 2018-02-23 DIAGNOSIS — D509 Iron deficiency anemia, unspecified: Secondary | ICD-10-CM | POA: Diagnosis not present

## 2018-02-23 DIAGNOSIS — B0239 Other herpes zoster eye disease: Secondary | ICD-10-CM | POA: Diagnosis not present

## 2018-02-23 DIAGNOSIS — R531 Weakness: Secondary | ICD-10-CM | POA: Diagnosis not present

## 2018-02-23 DIAGNOSIS — R4189 Other symptoms and signs involving cognitive functions and awareness: Secondary | ICD-10-CM | POA: Diagnosis not present

## 2018-02-23 DIAGNOSIS — N3080 Other cystitis without hematuria: Secondary | ICD-10-CM | POA: Diagnosis not present

## 2018-02-23 DIAGNOSIS — R5383 Other fatigue: Secondary | ICD-10-CM | POA: Diagnosis not present

## 2018-02-23 DIAGNOSIS — J42 Unspecified chronic bronchitis: Secondary | ICD-10-CM | POA: Diagnosis not present

## 2018-02-23 DIAGNOSIS — J45901 Unspecified asthma with (acute) exacerbation: Secondary | ICD-10-CM | POA: Diagnosis not present

## 2018-02-23 DIAGNOSIS — F418 Other specified anxiety disorders: Secondary | ICD-10-CM | POA: Diagnosis not present

## 2018-02-23 DIAGNOSIS — M79622 Pain in left upper arm: Secondary | ICD-10-CM | POA: Diagnosis not present

## 2018-02-23 DIAGNOSIS — R2681 Unsteadiness on feet: Secondary | ICD-10-CM | POA: Diagnosis not present

## 2018-02-23 DIAGNOSIS — R278 Other lack of coordination: Secondary | ICD-10-CM | POA: Diagnosis not present

## 2018-02-23 DIAGNOSIS — F329 Major depressive disorder, single episode, unspecified: Secondary | ICD-10-CM | POA: Diagnosis not present

## 2018-02-23 DIAGNOSIS — Z7389 Other problems related to life management difficulty: Secondary | ICD-10-CM | POA: Diagnosis not present

## 2018-02-23 DIAGNOSIS — M25552 Pain in left hip: Secondary | ICD-10-CM | POA: Diagnosis not present

## 2018-02-23 DIAGNOSIS — I10 Essential (primary) hypertension: Secondary | ICD-10-CM | POA: Diagnosis not present

## 2018-02-23 DIAGNOSIS — S42302D Unspecified fracture of shaft of humerus, left arm, subsequent encounter for fracture with routine healing: Secondary | ICD-10-CM | POA: Diagnosis not present

## 2018-02-24 DIAGNOSIS — R278 Other lack of coordination: Secondary | ICD-10-CM | POA: Diagnosis not present

## 2018-02-24 DIAGNOSIS — M79622 Pain in left upper arm: Secondary | ICD-10-CM | POA: Diagnosis not present

## 2018-02-24 DIAGNOSIS — Z9181 History of falling: Secondary | ICD-10-CM | POA: Diagnosis not present

## 2018-02-24 DIAGNOSIS — Z7389 Other problems related to life management difficulty: Secondary | ICD-10-CM | POA: Diagnosis not present

## 2018-02-24 DIAGNOSIS — S42302D Unspecified fracture of shaft of humerus, left arm, subsequent encounter for fracture with routine healing: Secondary | ICD-10-CM | POA: Diagnosis not present

## 2018-02-24 DIAGNOSIS — M6281 Muscle weakness (generalized): Secondary | ICD-10-CM | POA: Diagnosis not present

## 2018-02-25 DIAGNOSIS — Z7389 Other problems related to life management difficulty: Secondary | ICD-10-CM | POA: Diagnosis not present

## 2018-02-25 DIAGNOSIS — M6281 Muscle weakness (generalized): Secondary | ICD-10-CM | POA: Diagnosis not present

## 2018-02-25 DIAGNOSIS — M79622 Pain in left upper arm: Secondary | ICD-10-CM | POA: Diagnosis not present

## 2018-02-25 DIAGNOSIS — Z9181 History of falling: Secondary | ICD-10-CM | POA: Diagnosis not present

## 2018-02-25 DIAGNOSIS — S42302D Unspecified fracture of shaft of humerus, left arm, subsequent encounter for fracture with routine healing: Secondary | ICD-10-CM | POA: Diagnosis not present

## 2018-02-25 DIAGNOSIS — R278 Other lack of coordination: Secondary | ICD-10-CM | POA: Diagnosis not present

## 2018-02-26 DIAGNOSIS — Z9181 History of falling: Secondary | ICD-10-CM | POA: Diagnosis not present

## 2018-02-26 DIAGNOSIS — R278 Other lack of coordination: Secondary | ICD-10-CM | POA: Diagnosis not present

## 2018-02-26 DIAGNOSIS — M6281 Muscle weakness (generalized): Secondary | ICD-10-CM | POA: Diagnosis not present

## 2018-02-26 DIAGNOSIS — S42302D Unspecified fracture of shaft of humerus, left arm, subsequent encounter for fracture with routine healing: Secondary | ICD-10-CM | POA: Diagnosis not present

## 2018-02-26 DIAGNOSIS — Z7389 Other problems related to life management difficulty: Secondary | ICD-10-CM | POA: Diagnosis not present

## 2018-02-26 DIAGNOSIS — M79622 Pain in left upper arm: Secondary | ICD-10-CM | POA: Diagnosis not present

## 2018-02-27 DIAGNOSIS — S42302D Unspecified fracture of shaft of humerus, left arm, subsequent encounter for fracture with routine healing: Secondary | ICD-10-CM | POA: Diagnosis not present

## 2018-02-27 DIAGNOSIS — M79622 Pain in left upper arm: Secondary | ICD-10-CM | POA: Diagnosis not present

## 2018-02-27 DIAGNOSIS — R278 Other lack of coordination: Secondary | ICD-10-CM | POA: Diagnosis not present

## 2018-02-27 DIAGNOSIS — M6281 Muscle weakness (generalized): Secondary | ICD-10-CM | POA: Diagnosis not present

## 2018-02-27 DIAGNOSIS — Z7389 Other problems related to life management difficulty: Secondary | ICD-10-CM | POA: Diagnosis not present

## 2018-02-27 DIAGNOSIS — Z9181 History of falling: Secondary | ICD-10-CM | POA: Diagnosis not present

## 2018-03-02 DIAGNOSIS — Z7389 Other problems related to life management difficulty: Secondary | ICD-10-CM | POA: Diagnosis not present

## 2018-03-02 DIAGNOSIS — S42302D Unspecified fracture of shaft of humerus, left arm, subsequent encounter for fracture with routine healing: Secondary | ICD-10-CM | POA: Diagnosis not present

## 2018-03-02 DIAGNOSIS — M6281 Muscle weakness (generalized): Secondary | ICD-10-CM | POA: Diagnosis not present

## 2018-03-02 DIAGNOSIS — M79622 Pain in left upper arm: Secondary | ICD-10-CM | POA: Diagnosis not present

## 2018-03-02 DIAGNOSIS — R278 Other lack of coordination: Secondary | ICD-10-CM | POA: Diagnosis not present

## 2018-03-02 DIAGNOSIS — Z9181 History of falling: Secondary | ICD-10-CM | POA: Diagnosis not present

## 2018-03-03 DIAGNOSIS — Z9181 History of falling: Secondary | ICD-10-CM | POA: Diagnosis not present

## 2018-03-03 DIAGNOSIS — S42302D Unspecified fracture of shaft of humerus, left arm, subsequent encounter for fracture with routine healing: Secondary | ICD-10-CM | POA: Diagnosis not present

## 2018-03-03 DIAGNOSIS — R278 Other lack of coordination: Secondary | ICD-10-CM | POA: Diagnosis not present

## 2018-03-03 DIAGNOSIS — M6281 Muscle weakness (generalized): Secondary | ICD-10-CM | POA: Diagnosis not present

## 2018-03-03 DIAGNOSIS — M79622 Pain in left upper arm: Secondary | ICD-10-CM | POA: Diagnosis not present

## 2018-03-03 DIAGNOSIS — Z7389 Other problems related to life management difficulty: Secondary | ICD-10-CM | POA: Diagnosis not present

## 2018-03-04 DIAGNOSIS — Z7389 Other problems related to life management difficulty: Secondary | ICD-10-CM | POA: Diagnosis not present

## 2018-03-04 DIAGNOSIS — Z9181 History of falling: Secondary | ICD-10-CM | POA: Diagnosis not present

## 2018-03-04 DIAGNOSIS — S42302D Unspecified fracture of shaft of humerus, left arm, subsequent encounter for fracture with routine healing: Secondary | ICD-10-CM | POA: Diagnosis not present

## 2018-03-04 DIAGNOSIS — M6281 Muscle weakness (generalized): Secondary | ICD-10-CM | POA: Diagnosis not present

## 2018-03-04 DIAGNOSIS — R278 Other lack of coordination: Secondary | ICD-10-CM | POA: Diagnosis not present

## 2018-03-04 DIAGNOSIS — M79622 Pain in left upper arm: Secondary | ICD-10-CM | POA: Diagnosis not present

## 2018-03-05 ENCOUNTER — Encounter: Payer: Self-pay | Admitting: Internal Medicine

## 2018-03-05 ENCOUNTER — Non-Acute Institutional Stay (SKILLED_NURSING_FACILITY): Payer: Medicare Other | Admitting: Internal Medicine

## 2018-03-05 DIAGNOSIS — I10 Essential (primary) hypertension: Secondary | ICD-10-CM

## 2018-03-05 DIAGNOSIS — K219 Gastro-esophageal reflux disease without esophagitis: Secondary | ICD-10-CM | POA: Diagnosis not present

## 2018-03-05 DIAGNOSIS — S42302D Unspecified fracture of shaft of humerus, left arm, subsequent encounter for fracture with routine healing: Secondary | ICD-10-CM | POA: Diagnosis not present

## 2018-03-05 DIAGNOSIS — R278 Other lack of coordination: Secondary | ICD-10-CM | POA: Diagnosis not present

## 2018-03-05 DIAGNOSIS — M79622 Pain in left upper arm: Secondary | ICD-10-CM | POA: Diagnosis not present

## 2018-03-05 DIAGNOSIS — K5901 Slow transit constipation: Secondary | ICD-10-CM | POA: Diagnosis not present

## 2018-03-05 DIAGNOSIS — D509 Iron deficiency anemia, unspecified: Secondary | ICD-10-CM

## 2018-03-05 DIAGNOSIS — Z7389 Other problems related to life management difficulty: Secondary | ICD-10-CM | POA: Diagnosis not present

## 2018-03-05 DIAGNOSIS — Z9181 History of falling: Secondary | ICD-10-CM | POA: Diagnosis not present

## 2018-03-05 DIAGNOSIS — F0150 Vascular dementia without behavioral disturbance: Secondary | ICD-10-CM | POA: Diagnosis not present

## 2018-03-05 DIAGNOSIS — M6281 Muscle weakness (generalized): Secondary | ICD-10-CM | POA: Diagnosis not present

## 2018-03-05 DIAGNOSIS — J42 Unspecified chronic bronchitis: Secondary | ICD-10-CM | POA: Diagnosis not present

## 2018-03-05 NOTE — Progress Notes (Signed)
Location:  [facility] Room Number: 42-B Place of Service:  SNF [id number]) Provider:  Blanchie Serve MD  Blanchie Serve, MD  Patient Care Team: Blanchie Serve, MD as PCP - General (Internal Medicine) [name], MD as Referring Physician (Ophthalmology)  Extended Emergency Contact Information Primary Emergency Contact: [name] Address: [address]          [place], [place] [address] [name] of [place] Phone: [phone] Mobile Phone: [phone] Relation: Son Secondary Emergency Contact: [name] Address: [address]          [place], [place] [address] [name] of [place] Phone: [phone] Mobile Phone: [phone] Relation: Son  Code Status:  DNR  Goals of care: Advanced Directive information Advanced Directives 03/05/2018  Does Patient Have a Medical Advance Directive? Yes  Type of Advance Directive Out of facility DNR (pink MOST or yellow form)  Does patient want to make changes to medical advance directive? -  Copy of [place] in Chart? -  Would patient like information on creating a medical advance directive? -  Pre-existing out of facility DNR order (yellow form or pink MOST form) Yellow form placed in chart (order not valid for inpatient use);Pink MOST form placed in chart (order not valid for inpatient use)     Chief Complaint  Patient presents with  . Medical Management of Chronic Issues    monthly routine    HPI:  Pt is a 84 y.o. female seen today for medical management of chronic diseases. She is seen in her room with charge nurse present. She denies any acute concern this visit. She is working with therapy team for ROM and strengthening exercises. No fall reported. She is NWB to LUE at present and has upcoming follow up with orthopedic on 03/10/18. She denies any pain this visit. Staff have to assist her with transfer and she ambulates with her wheelchair. Reviewed her BP  readings with SBP mostly in 130s. tolerating metoprolol well. Taking her iron supplement. Stool softeners have been helpful to help regulate her bowels.    Past Medical History:  Diagnosis Date  . Anemia, iron deficiency    takes Ferrous Sulfate daily  . Depression    takes Effexor daily  . Gait disorder 04/21/2014  . GERD (gastroesophageal reflux disease)    takes Omeprazole daily  . Herpes ocular    history of-takes Acyclovir daily  . High cholesterol    takes Atorvastatin daily  . History of hiatal hernia   . Hypertension    takes Metoprolol daily  . Insomnia    takes Xanax nightly  . Osteoarthritis of knee    bilateral  . Sciatic pain    Past Surgical History:  Procedure Laterality Date  . ABDOMINAL HYSTERECTOMY  1995   partial  . CARPAL TUNNEL RELEASE Right 07/21/2007  . CARPAL TUNNEL RELEASE Left 09/24/2007  . COLONOSCOPY    . DESCEMETS STRIPPING AUTOMATED ENDOTHELIAL KERATOPLASTY Left 03/14/2011  . DESCEMETS STRIPPING AUTOMATED ENDOTHELIAL KERATOPLASTY Right 08/20/2012  . EYE SURGERY Bilateral    cataract surgery  . EYE SURGERY Left    corneal transplant  . HERNIA REPAIR    . HIP ARTHROPLASTY Left 06/13/2013   Procedure: ARTHROPLASTY BIPOLAR HIP; Injection left shoulder;  Surgeon: Johnny Bridge, MD;  Location: [facility];  Service: Orthopedics;  Laterality: Left;  . JOINT REPLACEMENT     bilateral knees, left knee  . KNEE ARTHROSCOPY Right 05/11/2001  . KYPHOPLASTY N/A 04/10/2015  Procedure: T11 Kyphoplasty;  Surgeon: Jovita Gamma, MD;  Location: [facility];  Service: Neurosurgery;  Laterality: N/A;  T11 Kyphoplasty  . KYPHOPLASTY N/A 05/06/2015   Procedure: KYPHOPLASTY Thoracic twelve;  Surgeon: Jovita Gamma, MD;  Location: [facility];  Service: Neurosurgery;  Laterality: N/A;  T12 Kyphoplasty  . LAPAROSCOPIC NISSEN FUNDOPLICATION  1/91/4782  . LUMBAR LAMINECTOMY/DECOMPRESSION MICRODISCECTOMY  08/29/2010   L2-S1  . SHOULDER ARTHROSCOPY WITH ROTATOR CUFF  REPAIR AND SUBACROMIAL DECOMPRESSION Right 01/01/2000  . TONSILLECTOMY     as child  . TOTAL HIP REVISION Left 07/08/2014   Procedure: TOTAL HIP REVISION;  Surgeon: Kerin Salen, MD;  Location: [facility];  Service: Orthopedics;  Laterality: Left;  . TOTAL KNEE ARTHROPLASTY Left 05/14/2004  . TOTAL KNEE ARTHROPLASTY  12/23/2011   Procedure: TOTAL KNEE ARTHROPLASTY;  Surgeon: Lorn Junes, MD;  Location: [facility];  Service: Orthopedics;  Laterality: Right;  DR Gorham THIS CASE  . TRIGGER FINGER RELEASE Right 07/21/2007   thumb  . TRIGGER FINGER RELEASE Left 09/24/2007   middle finger  . TRIGGER FINGER RELEASE Right 03/10/2008   ring and little fingers  . TRIGGER FINGER RELEASE Right 09/02/2012   Procedure: RELEASE TRIGGER FINGER/A-1 PULLEY RIGHT INDEX FINGER;  Surgeon: Wynonia Sours, MD;  Location: [facility];  Service: Orthopedics;  Laterality: Right;  . TRIGGER FINGER RELEASE Left 12/22/2012   Procedure: RELEASE TRIGGER FINGER/A-1 PULLEY LEFT RING FINGER;  Surgeon: Wynonia Sours, MD;  Location: [facility];  Service: Orthopedics;  Laterality: Left;  Left     Allergies  Allergen Reactions  . Morphine And Related Other (See Comments)    AGITATION, STRANGE DREAMS  . Scopolamine Other (See Comments)    MENTAL CHANGES  . Atorvastatin Other (See Comments)    unknown  . Morphine Other (See Comments)    Disoriented, mood changes  . Nsaids Other (See Comments)    unknown  . Pravachol [Pravastatin Sodium] Other (See Comments)    unknown  . Pravastatin Other (See Comments)    unknown  . Teriparatide Other (See Comments)    unknown    Outpatient Encounter Medications as of 03/05/2018  Medication Sig  . acetaminophen (TYLENOL) 500 MG tablet Take 500 mg by mouth every 6 (six) hours as needed for mild pain or moderate pain.  Marland Kitchen albuterol (PROVENTIL) (2.5 MG/3ML) 0.083% nebulizer solution Take 2.5 mg by nebulization every 4 (four) hours as needed for  wheezing or shortness of breath.  . Artificial Tear Solution (GENTEAL TEARS) 0.1-0.2-0.3 % SOLN Apply 1 drop to eye at bedtime as needed.  Marland Kitchen aspirin EC 81 MG tablet Take 81 mg by mouth daily.  . Cholecalciferol (VITAMIN D) 2000 units CAPS Take 2,000 Units by mouth daily.  Marland Kitchen Dextran 70-Hypromellose (ARTIFICIAL TEARS) 0.1-0.3 % SOLN Apply 1 drop to eye 4 (four) times daily.  Marland Kitchen docusate sodium (COLACE) 100 MG capsule Take 1 capsule by mouth daily as needed for mild constipation.   . feeding supplement (BOOST / RESOURCE BREEZE) LIQD Take 120 mLs by mouth 3 (three) times daily between meals. For weight loss  . ferrous sulfate 325 (65 FE) MG tablet Take 325 mg by mouth daily.  . fluticasone furoate-vilanterol (BREO ELLIPTA) 100-25 MCG/INH AEPB Inhale 1 puff into the lungs daily.  Marland Kitchen loratadine (CLARITIN) 10 MG tablet Take 10 mg by mouth daily as needed for allergies.  Marland Kitchen LORazepam (ATIVAN) 0.5 MG tablet Take 0.25 mg by mouth daily.   Marland Kitchen  magnesium hydroxide (MILK OF MAGNESIA) 400 MG/5ML suspension Take 15 mLs by mouth at bedtime as needed for mild constipation.   . memantine (NAMENDA) 10 MG tablet Take 10 mg by mouth 2 (two) times daily.  . metoprolol tartrate (LOPRESSOR) 25 MG tablet Take 25 mg by mouth as directed. 50 mg every AM and 25 mg at bedtime  . nitroGLYCERIN (NITROSTAT) 0.4 MG SL tablet Place 0.4 mg under the tongue every 5 (five) minutes as needed for chest pain.  Marland Kitchen omeprazole (PRILOSEC) 20 MG capsule Take 20 mg by mouth daily.   Marland Kitchen venlafaxine XR (EFFEXOR-XR) 150 MG 24 hr capsule Take 150 mg by mouth at bedtime.   . [DISCONTINUED] metoprolol tartrate (LOPRESSOR) 25 MG tablet Take 2 tablet = 50 mg in am and 1 tablet at bedtime (Patient taking differently: Take 50 mg by mouth daily. Take 2 tablet = 50 mg in am and 1 tablet at bedtime)   No facility-administered encounter medications on file as of 03/05/2018.     Review of Systems  Reason unable to perform ROS: limited due to dementia.    Constitutional: Negative for appetite change, chills and fever.  HENT: Negative for congestion, mouth sores, rhinorrhea and trouble swallowing.   Respiratory: Negative for cough and shortness of breath.   Cardiovascular: Negative for chest pain and palpitations.  Gastrointestinal: Positive for constipation. Negative for abdominal pain, diarrhea, nausea and vomiting.       Denies heartburn or indigestion, had a bowel movement yesterday  Genitourinary: Negative for dysuria.       Continent with bowel and bladder  Musculoskeletal: Positive for gait problem.  Skin: Negative for rash.  Neurological: Negative for dizziness and headaches.  Psychiatric/Behavioral: Positive for confusion.    Immunization History  Administered Date(s) Administered  . Influenza-Unspecified 04/14/2017  . Pneumococcal Polysaccharide-23 06/14/2013   Pertinent  Health Maintenance Due  Topic Date Due  . PNA vac Low Risk Adult (2 of 2 - PCV13) 06/14/2014  . INFLUENZA VACCINE  01/22/2018  . DEXA SCAN  Completed   Fall Risk  09/19/2017  Falls in the past year? No   Functional Status Survey:    Vitals:   03/05/18 1128  BP: 136/78  Pulse: 80  Resp: 20  Temp: 98.3 F (36.8 C)  SpO2: 94%  Weight: 116 lb 12.8 oz (53 kg)  Height: 5\' 4"  (1.626 m)   Body mass index is 20.05 kg/m.   Wt Readings from Last 3 Encounters:  03/05/18 116 lb 12.8 oz (53 kg)  01/28/18 115 lb 12.8 oz (52.5 kg)  12/29/17 117 lb 9.6 oz (53.3 kg)   Physical Exam  Constitutional: She appears well-developed and well-nourished. No distress.  HENT:  Head: Normocephalic and atraumatic.  Mouth/Throat: Oropharynx is clear and moist.  Eyes: Pupils are equal, round, and reactive to light. Conjunctivae are normal. Right eye exhibits no discharge. Left eye exhibits no discharge.  Neck: Normal range of motion. Neck supple.  Cardiovascular: Normal rate and regular rhythm.  Pulmonary/Chest: Effort normal. She has no wheezes. She has no rales.   Decreased breath sounds to lung bases  Abdominal: Soft. Bowel sounds are normal. There is no tenderness. There is no guarding.  Musculoskeletal:  Good ROM with upper extremities, weakness to her legs, unsteady gait, uses wheelchair for ambulation and needs assistance with transfer, no leg edema  Lymphadenopathy:    She has no cervical adenopathy.  Neurological: She is alert. She exhibits normal muscle tone.  Oriented to person and place  but not to time  Skin: Skin is warm and dry. She is not diaphoretic.  Psychiatric: She has a normal mood and affect.    Labs reviewed: Recent Labs    07/30/17 1113 08/03/17 1851 08/08/17 1135  11/04/17 11/13/17 11/18/17  NA 130* 131* 130*   < > 133* 131* 131*  131*  K 4.3 4.4 4.5   < > 3.9 4.3 4.7  4.7  CL 98* 96* 96*  --  98 98 99  CO2 21* 26 22  --  26 24 26   GLUCOSE 94 96 91  --   --   --   --   BUN 14 17 11    < > 11 12 17  17   CREATININE 0.79 0.72 0.67   < > 0.6 0.6 0.6  0.6  CALCIUM 8.9 9.1 9.1  --  9.2 9.0 8.8   < > = values in this interval not displayed.   Recent Labs    07/30/17 1113 08/03/17 1851  11/04/17 11/13/17 11/18/17  AST 24 23   < > 20 18 18  18   ALT 16 16   < > 15 13 13  13   ALKPHOS 148* 142*   < > 207* 158* 156*  156*  BILITOT 0.3 0.3  --   --   --   --   PROT 6.6 7.5  --   --   --   --   ALBUMIN 3.4* 4.0  --  4.2 4.0 3.5   < > = values in this interval not displayed.   Recent Labs    07/30/17 1113 08/03/17 1851 08/08/17 1135  10/07/17 10/30/17 11/13/17  WBC 8.4 6.8 8.0   < > 5.5 5.3 5.4  NEUTROABS 5.2 3.5 4.8  --   --   --   --   HGB 12.1 13.0 13.1   < > 12.0 11.6* 12.9  HCT 35.8* 38.8 39.5   < > 34* 34* 38  MCV 96.5 97.5 97.8  --   --   --   --   PLT 325 375 318   < > 340 363 369   < > = values in this interval not displayed.   Lab Results  Component Value Date   TSH 2.99 11/13/2017   Lab Results  Component Value Date   HGBA1C 5.8 06/29/2013   No results found for: CHOL, HDL, LDLCALC,  LDLDIRECT, TRIG, CHOLHDL  Significant Diagnostic Results in last 30 days:  No results found.  Assessment/Plan  1. Chronic bronchitis, unspecified chronic bronchitis type (HCC) Continue breo ellipta and prn albuterol. Monitor her breathing  2. Essential hypertension Controlled BP readings. Continue metoprolol tartrate 50 mg am and 25 mg pm. Check BMP. Continue aspirin.   3. Gastroesophageal reflux disease, esophagitis presence not specified Controlled symptom. Decrease omeprazole to 10 mg daily and monitor.   4. Vascular dementia without behavioral disturbance Continue memantine 10 mg bid and supportive care.   5. Iron deficiency anemia, unspecified iron deficiency anemia type Check cbc. Continue iron supplement. Continue stool softner to prevent constipation.   6. Slow transit constipation Continue colace and milk of magnesia, encourage and maintain hydration    Family/ staff Communication: reviewed care plan with patient and charge nurse.    Labs/tests ordered:   Cbc, BMP 03/10/18   Blanchie Serve, MD Internal Medicine [facility] [address] [place], [place] [address] Cell Phone (Monday-Friday 8 am - 5 pm): [phone] On Call:  [phone] and follow prompts after 5 pm and on weekends Office Phone: [phone] Office Fax: [phone]

## 2018-03-09 DIAGNOSIS — M6281 Muscle weakness (generalized): Secondary | ICD-10-CM | POA: Diagnosis not present

## 2018-03-09 DIAGNOSIS — M79622 Pain in left upper arm: Secondary | ICD-10-CM | POA: Diagnosis not present

## 2018-03-09 DIAGNOSIS — Z9181 History of falling: Secondary | ICD-10-CM | POA: Diagnosis not present

## 2018-03-09 DIAGNOSIS — S42302D Unspecified fracture of shaft of humerus, left arm, subsequent encounter for fracture with routine healing: Secondary | ICD-10-CM | POA: Diagnosis not present

## 2018-03-09 DIAGNOSIS — Z7389 Other problems related to life management difficulty: Secondary | ICD-10-CM | POA: Diagnosis not present

## 2018-03-09 DIAGNOSIS — R278 Other lack of coordination: Secondary | ICD-10-CM | POA: Diagnosis not present

## 2018-03-10 ENCOUNTER — Encounter: Payer: Self-pay | Admitting: Nurse Practitioner

## 2018-03-10 DIAGNOSIS — S42202D Unspecified fracture of upper end of left humerus, subsequent encounter for fracture with routine healing: Secondary | ICD-10-CM | POA: Diagnosis not present

## 2018-03-10 DIAGNOSIS — I10 Essential (primary) hypertension: Secondary | ICD-10-CM | POA: Diagnosis not present

## 2018-03-10 DIAGNOSIS — D509 Iron deficiency anemia, unspecified: Secondary | ICD-10-CM | POA: Diagnosis not present

## 2018-03-11 DIAGNOSIS — M79622 Pain in left upper arm: Secondary | ICD-10-CM | POA: Diagnosis not present

## 2018-03-11 DIAGNOSIS — R278 Other lack of coordination: Secondary | ICD-10-CM | POA: Diagnosis not present

## 2018-03-11 DIAGNOSIS — M6281 Muscle weakness (generalized): Secondary | ICD-10-CM | POA: Diagnosis not present

## 2018-03-11 DIAGNOSIS — S42302D Unspecified fracture of shaft of humerus, left arm, subsequent encounter for fracture with routine healing: Secondary | ICD-10-CM | POA: Diagnosis not present

## 2018-03-11 DIAGNOSIS — Z7389 Other problems related to life management difficulty: Secondary | ICD-10-CM | POA: Diagnosis not present

## 2018-03-11 DIAGNOSIS — Z9181 History of falling: Secondary | ICD-10-CM | POA: Diagnosis not present

## 2018-03-12 DIAGNOSIS — R278 Other lack of coordination: Secondary | ICD-10-CM | POA: Diagnosis not present

## 2018-03-12 DIAGNOSIS — S42302D Unspecified fracture of shaft of humerus, left arm, subsequent encounter for fracture with routine healing: Secondary | ICD-10-CM | POA: Diagnosis not present

## 2018-03-12 DIAGNOSIS — M6281 Muscle weakness (generalized): Secondary | ICD-10-CM | POA: Diagnosis not present

## 2018-03-12 DIAGNOSIS — M79622 Pain in left upper arm: Secondary | ICD-10-CM | POA: Diagnosis not present

## 2018-03-12 DIAGNOSIS — Z7389 Other problems related to life management difficulty: Secondary | ICD-10-CM | POA: Diagnosis not present

## 2018-03-12 DIAGNOSIS — Z9181 History of falling: Secondary | ICD-10-CM | POA: Diagnosis not present

## 2018-03-13 DIAGNOSIS — Z9181 History of falling: Secondary | ICD-10-CM | POA: Diagnosis not present

## 2018-03-13 DIAGNOSIS — Z7389 Other problems related to life management difficulty: Secondary | ICD-10-CM | POA: Diagnosis not present

## 2018-03-13 DIAGNOSIS — M6281 Muscle weakness (generalized): Secondary | ICD-10-CM | POA: Diagnosis not present

## 2018-03-13 DIAGNOSIS — M79622 Pain in left upper arm: Secondary | ICD-10-CM | POA: Diagnosis not present

## 2018-03-13 DIAGNOSIS — R278 Other lack of coordination: Secondary | ICD-10-CM | POA: Diagnosis not present

## 2018-03-13 DIAGNOSIS — S42302D Unspecified fracture of shaft of humerus, left arm, subsequent encounter for fracture with routine healing: Secondary | ICD-10-CM | POA: Diagnosis not present

## 2018-03-16 DIAGNOSIS — S42302D Unspecified fracture of shaft of humerus, left arm, subsequent encounter for fracture with routine healing: Secondary | ICD-10-CM | POA: Diagnosis not present

## 2018-03-16 DIAGNOSIS — R278 Other lack of coordination: Secondary | ICD-10-CM | POA: Diagnosis not present

## 2018-03-16 DIAGNOSIS — Z9181 History of falling: Secondary | ICD-10-CM | POA: Diagnosis not present

## 2018-03-16 DIAGNOSIS — M79622 Pain in left upper arm: Secondary | ICD-10-CM | POA: Diagnosis not present

## 2018-03-16 DIAGNOSIS — M6281 Muscle weakness (generalized): Secondary | ICD-10-CM | POA: Diagnosis not present

## 2018-03-16 DIAGNOSIS — Z7389 Other problems related to life management difficulty: Secondary | ICD-10-CM | POA: Diagnosis not present

## 2018-03-17 DIAGNOSIS — Z9181 History of falling: Secondary | ICD-10-CM | POA: Diagnosis not present

## 2018-03-17 DIAGNOSIS — M79622 Pain in left upper arm: Secondary | ICD-10-CM | POA: Diagnosis not present

## 2018-03-17 DIAGNOSIS — S42302D Unspecified fracture of shaft of humerus, left arm, subsequent encounter for fracture with routine healing: Secondary | ICD-10-CM | POA: Diagnosis not present

## 2018-03-17 DIAGNOSIS — R278 Other lack of coordination: Secondary | ICD-10-CM | POA: Diagnosis not present

## 2018-03-17 DIAGNOSIS — Z7389 Other problems related to life management difficulty: Secondary | ICD-10-CM | POA: Diagnosis not present

## 2018-03-17 DIAGNOSIS — M6281 Muscle weakness (generalized): Secondary | ICD-10-CM | POA: Diagnosis not present

## 2018-03-18 ENCOUNTER — Encounter: Payer: Self-pay | Admitting: Nurse Practitioner

## 2018-03-18 ENCOUNTER — Non-Acute Institutional Stay (SKILLED_NURSING_FACILITY): Payer: Medicare Other | Admitting: Nurse Practitioner

## 2018-03-18 DIAGNOSIS — E871 Hypo-osmolality and hyponatremia: Secondary | ICD-10-CM | POA: Diagnosis not present

## 2018-03-18 DIAGNOSIS — I10 Essential (primary) hypertension: Secondary | ICD-10-CM | POA: Diagnosis not present

## 2018-03-18 DIAGNOSIS — F0150 Vascular dementia without behavioral disturbance: Secondary | ICD-10-CM | POA: Diagnosis not present

## 2018-03-18 DIAGNOSIS — J42 Unspecified chronic bronchitis: Secondary | ICD-10-CM

## 2018-03-18 DIAGNOSIS — K219 Gastro-esophageal reflux disease without esophagitis: Secondary | ICD-10-CM

## 2018-03-18 DIAGNOSIS — R000 Tachycardia, unspecified: Secondary | ICD-10-CM | POA: Diagnosis not present

## 2018-03-18 DIAGNOSIS — J811 Chronic pulmonary edema: Secondary | ICD-10-CM | POA: Diagnosis not present

## 2018-03-18 DIAGNOSIS — R5383 Other fatigue: Secondary | ICD-10-CM | POA: Diagnosis not present

## 2018-03-18 LAB — CBC AND DIFFERENTIAL
HEMATOCRIT: 37 (ref 36–46)
Hemoglobin: 12.6 (ref 12.0–16.0)
PLATELETS: 308 (ref 150–399)
WBC: 8.7

## 2018-03-18 LAB — BASIC METABOLIC PANEL
BUN: 12 (ref 4–21)
Creatinine: 0.6 (ref <=1.1)
GLUCOSE: 100
Potassium: 4.4 (ref 3.4–5.3)
SODIUM: 132 — AB (ref 137–147)

## 2018-03-18 NOTE — Assessment & Plan Note (Signed)
Tachycardia: heart rate is in control on Metoprolol 50mg  qd. EKG: vent rate 80 bmp, no significance ST-T changes

## 2018-03-18 NOTE — Progress Notes (Signed)
Location:  [facility] Room Number: 42-B Place of Service:  SNF (31) Provider:  Mast, ManXie  NP  Blanchie Serve, MD  Patient Care Team: Blanchie Serve, MD as PCP - General (Internal Medicine) [name], MD as Referring Physician (Ophthalmology)  Extended Emergency Contact Information Primary Emergency Contact: [name] Address: [address]          [place], [place] [address] [name] of [place] Phone: [phone] Mobile Phone: [phone] Relation: Son Secondary Emergency Contact: [name] Address: [address]          [place], [place] [address] [name] of [place] Phone: [phone] Mobile Phone: [phone] Relation: Son  Code Status:  DNR Goals of care: Advanced Directive information Advanced Directives 03/05/2018  Does Patient Have a Medical Advance Directive? Yes  Type of Advance Directive Out of facility DNR (pink MOST or yellow form)  Does patient want to make changes to medical advance directive? -  Copy of [place] in Chart? -  Would patient like information on creating a medical advance directive? -  Pre-existing out of facility DNR order (yellow form or pink MOST form) Yellow form placed in chart (order not valid for inpatient use);Pink MOST form placed in chart (order not valid for inpatient use)     Chief Complaint  Patient presents with  . Acute Visit    cough, tachycardia, diminished lung sounds    HPI:  Pt is a 84 y.o. female seen today for an acute visit for reported cough, diminished lung sounds, and tachycardia 03/17/18 evening. DuoNeb q6h prn, Vs qshift x72 hrs, EKG: vent rate 80 bmp, no significance ST-T changes, CBC wbc 8.7, Hgb 12.6, plt 308, Na 132, K 4.4, Bun 12, creat 0.62. CXR no acute cardiopulmonary disease. She is afebrile, in her usual state of health today 03/18/18. Hx of dementia, HPI was provided with assistance of staff, on Memantine 10mg  bid for  memory. GERD, managed on Omeprazole 20mg  qd. Tachycardia: heart rate is in control on Metoprolol 50mg  qd   Past Medical History:  Diagnosis Date  . Anemia, iron deficiency    takes Ferrous Sulfate daily  . Depression    takes Effexor daily  . Gait disorder 04/21/2014  . GERD (gastroesophageal reflux disease)    takes Omeprazole daily  . Herpes ocular    history of-takes Acyclovir daily  . High cholesterol    takes Atorvastatin daily  . History of hiatal hernia   . Hypertension    takes Metoprolol daily  . Insomnia    takes Xanax nightly  . Osteoarthritis of knee    bilateral  . Sciatic pain    Past Surgical History:  Procedure Laterality Date  . ABDOMINAL HYSTERECTOMY  1995   partial  . CARPAL TUNNEL RELEASE Right 07/21/2007  . CARPAL TUNNEL RELEASE Left 09/24/2007  . COLONOSCOPY    . DESCEMETS STRIPPING AUTOMATED ENDOTHELIAL KERATOPLASTY Left 03/14/2011  . DESCEMETS STRIPPING AUTOMATED ENDOTHELIAL KERATOPLASTY Right 08/20/2012  . EYE SURGERY Bilateral    cataract surgery  . EYE SURGERY Left    corneal transplant  . HERNIA REPAIR    . HIP ARTHROPLASTY Left 06/13/2013   Procedure: ARTHROPLASTY BIPOLAR HIP; Injection left shoulder;  Surgeon: Johnny Bridge, MD;  Location: [facility];  Service: Orthopedics;  Laterality: Left;  . JOINT REPLACEMENT     bilateral knees, left knee  . KNEE ARTHROSCOPY Right 05/11/2001  . KYPHOPLASTY N/A 04/10/2015   Procedure: T11 Kyphoplasty;  Surgeon: Herbie Baltimore  Sherwood Gambler, MD;  Location: [facility];  Service: Neurosurgery;  Laterality: N/A;  T11 Kyphoplasty  . KYPHOPLASTY N/A 05/06/2015   Procedure: KYPHOPLASTY Thoracic twelve;  Surgeon: Jovita Gamma, MD;  Location: [facility];  Service: Neurosurgery;  Laterality: N/A;  T12 Kyphoplasty  . LAPAROSCOPIC NISSEN FUNDOPLICATION  9/48/5462  . LUMBAR LAMINECTOMY/DECOMPRESSION MICRODISCECTOMY  08/29/2010   L2-S1  . SHOULDER ARTHROSCOPY WITH ROTATOR CUFF REPAIR AND SUBACROMIAL DECOMPRESSION Right 01/01/2000   . TONSILLECTOMY     as child  . TOTAL HIP REVISION Left 07/08/2014   Procedure: TOTAL HIP REVISION;  Surgeon: Kerin Salen, MD;  Location: [facility];  Service: Orthopedics;  Laterality: Left;  . TOTAL KNEE ARTHROPLASTY Left 05/14/2004  . TOTAL KNEE ARTHROPLASTY  12/23/2011   Procedure: TOTAL KNEE ARTHROPLASTY;  Surgeon: Lorn Junes, MD;  Location: [facility];  Service: Orthopedics;  Laterality: Right;  DR [place] THIS CASE  . TRIGGER FINGER RELEASE Right 07/21/2007   thumb  . TRIGGER FINGER RELEASE Left 09/24/2007   middle finger  . TRIGGER FINGER RELEASE Right 03/10/2008   ring and little fingers  . TRIGGER FINGER RELEASE Right 09/02/2012   Procedure: RELEASE TRIGGER FINGER/A-1 PULLEY RIGHT INDEX FINGER;  Surgeon: Wynonia Sours, MD;  Location: [facility];  Service: Orthopedics;  Laterality: Right;  . TRIGGER FINGER RELEASE Left 12/22/2012   Procedure: RELEASE TRIGGER FINGER/A-1 PULLEY LEFT RING FINGER;  Surgeon: Wynonia Sours, MD;  Location: [facility];  Service: Orthopedics;  Laterality: Left;  Left     Allergies  Allergen Reactions  . Morphine And Related Other (See Comments)    AGITATION, STRANGE DREAMS  . Scopolamine Other (See Comments)    MENTAL CHANGES  . Atorvastatin Other (See Comments)    unknown  . Morphine Other (See Comments)    Disoriented, mood changes  . Nsaids Other (See Comments)    unknown  . Pravachol [Pravastatin Sodium] Other (See Comments)    unknown  . Pravastatin Other (See Comments)    unknown  . Teriparatide Other (See Comments)    unknown    Outpatient Encounter Medications as of 03/18/2018  Medication Sig  . acetaminophen (TYLENOL) 500 MG tablet Take 500 mg by mouth every 6 (six) hours as needed for mild pain or moderate pain.  Marland Kitchen albuterol (PROVENTIL) (2.5 MG/3ML) 0.083% nebulizer solution Take 2.5 mg by nebulization every 4 (four) hours as needed for wheezing or shortness of breath.  . Artificial Tear  Solution (GENTEAL TEARS) 0.1-0.2-0.3 % SOLN Apply 1 drop to eye at bedtime as needed.  Marland Kitchen aspirin EC 81 MG tablet Take 81 mg by mouth daily.  . Cholecalciferol (VITAMIN D) 2000 units CAPS Take 2,000 Units by mouth daily.  Marland Kitchen Dextran 70-Hypromellose (ARTIFICIAL TEARS) 0.1-0.3 % SOLN Apply 1 drop to eye 4 (four) times daily.  Marland Kitchen docusate sodium (COLACE) 100 MG capsule Take 1 capsule by mouth daily as needed for mild constipation.   . feeding supplement (BOOST / RESOURCE BREEZE) LIQD Take 1 Container by mouth 2 (two) times daily. For weight loss   . ferrous sulfate 325 (65 FE) MG tablet Take 325 mg by mouth daily.  . fluticasone furoate-vilanterol (BREO ELLIPTA) 100-25 MCG/INH AEPB Inhale 1 puff into the lungs daily.  Marland Kitchen loratadine (CLARITIN) 10 MG tablet Take 10 mg by mouth daily as needed for allergies.  Marland Kitchen LORazepam (ATIVAN) 0.5 MG tablet Take 0.25 mg by mouth daily.   . magnesium hydroxide (MILK OF  MAGNESIA) 400 MG/5ML suspension Take 15 mLs by mouth at bedtime as needed for mild constipation.   . memantine (NAMENDA) 10 MG tablet Take 10 mg by mouth 2 (two) times daily.  . metoprolol tartrate (LOPRESSOR) 25 MG tablet Take 50 mg by mouth every morning.   . nitroGLYCERIN (NITROSTAT) 0.4 MG SL tablet Place 0.4 mg under the tongue every 5 (five) minutes as needed for chest pain.  Marland Kitchen omeprazole (PRILOSEC) 20 MG capsule Take 20 mg by mouth daily.   Marland Kitchen venlafaxine XR (EFFEXOR-XR) 150 MG 24 hr capsule Take 150 mg by mouth at bedtime.    No facility-administered encounter medications on file as of 03/18/2018.     Review of Systems  Constitutional: Negative for activity change, appetite change, chills, diaphoresis, fatigue and fever.  HENT: Positive for hearing loss. Negative for congestion and voice change.   Respiratory: Positive for cough. Negative for shortness of breath and wheezing.   Cardiovascular: Negative for chest pain, palpitations and leg swelling.  Gastrointestinal: Negative for abdominal  distention, abdominal pain, constipation, diarrhea, nausea and vomiting.  Genitourinary: Negative for difficulty urinating, dysuria and urgency.  Musculoskeletal: Positive for arthralgias, back pain and gait problem. Negative for joint swelling.  Neurological: Negative for dizziness, speech difficulty, weakness and headaches.       Dementia  Psychiatric/Behavioral: Positive for confusion. Negative for agitation, behavioral problems, hallucinations and sleep disturbance. The patient is not nervous/anxious.     Immunization History  Administered Date(s) Administered  . Influenza-Unspecified 04/14/2017  . Pneumococcal Polysaccharide-23 06/14/2013   Pertinent  Health Maintenance Due  Topic Date Due  . PNA vac Low Risk Adult (2 of 2 - PCV13) 06/14/2014  . INFLUENZA VACCINE  01/22/2018  . DEXA SCAN  Completed   Fall Risk  09/19/2017  Falls in the past year? No   Functional Status Survey:    Vitals:   03/18/18 1027  BP: (!) 146/70  Pulse: 73  Resp: 18  Temp: 97.9 F (36.6 C)  SpO2: 94%  Weight: 116 lb 12.8 oz (53 kg)  Height: 5\' 1"  (1.549 m)   Body mass index is 22.07 kg/m. Physical Exam  Constitutional: She appears well-developed and well-nourished.  HENT:  Head: Normocephalic.  Eyes: Pupils are equal, round, and reactive to light. EOM are normal.  Neck: Normal range of motion. Neck supple. No JVD present. No thyromegaly present.  Cardiovascular: Normal rate and regular rhythm.  No murmur heard. Pulmonary/Chest: She has no wheezes. She has no rales.  Somehow decreased air entry R+L lungs.   Abdominal: Soft. She exhibits no distension. There is no tenderness. There is no rebound and no guarding.  Musculoskeletal: She exhibits no edema.  Needs assistance for transfer. W/c to get around.   Neurological: She is alert. No cranial nerve deficit. She exhibits normal muscle tone. Coordination normal.  Oriented to person and place.   Skin: Skin is warm and dry.  Psychiatric: She  has a normal mood and affect. Her behavior is normal.    Labs reviewed: Recent Labs    07/30/17 1113 08/03/17 1851 08/08/17 1135  11/04/17 11/13/17 11/18/17  NA 130* 131* 130*   < > 133* 131* 131*  131*  K 4.3 4.4 4.5   < > 3.9 4.3 4.7  4.7  CL 98* 96* 96*  --  98 98 99  CO2 21* 26 22  --  26 24 26   GLUCOSE 94 96 91  --   --   --   --  BUN 14 17 11    < > 11 12 17  17   CREATININE 0.79 0.72 0.67   < > 0.6 0.6 0.6  0.6  CALCIUM 8.9 9.1 9.1  --  9.2 9.0 8.8   < > = values in this interval not displayed.   Recent Labs    07/30/17 1113 08/03/17 1851  11/04/17 11/13/17 11/18/17  AST 24 23   < > 20 18 18  18   ALT 16 16   < > 15 13 13  13   ALKPHOS 148* 142*   < > 207* 158* 156*  156*  BILITOT 0.3 0.3  --   --   --   --   PROT 6.6 7.5  --   --   --   --   ALBUMIN 3.4* 4.0  --  4.2 4.0 3.5   < > = values in this interval not displayed.   Recent Labs    07/30/17 1113 08/03/17 1851 08/08/17 1135  10/07/17 10/30/17 11/13/17  WBC 8.4 6.8 8.0   < > 5.5 5.3 5.4  NEUTROABS 5.2 3.5 4.8  --   --   --   --   HGB 12.1 13.0 13.1   < > 12.0 11.6* 12.9  HCT 35.8* 38.8 39.5   < > 34* 34* 38  MCV 96.5 97.5 97.8  --   --   --   --   PLT 325 375 318   < > 340 363 369   < > = values in this interval not displayed.   Lab Results  Component Value Date   TSH 2.99 11/13/2017   Lab Results  Component Value Date   HGBA1C 5.8 06/29/2013   No results found for: CHOL, HDL, LDLCALC, LDLDIRECT, TRIG, CHOLHDL  Significant Diagnostic Results in last 30 days:  No results found.  Assessment/Plan Chronic bronchitis ([place]) Reported cough, diminished lung sounds, and tachycardia 03/17/18 evening. DuoNeb q6h prn, Vs qshift x72 hrs, EKG: vent rate 80 bmp, no significance ST-T changes, CBC wbc 8.7, Hgb 12.6, plt 308, Na 132, K 4.4, Bun 12, creat 0.62. CXR no acute cardiopulmonary disease. She is afebrile, in her usual state of health today 03/18/18. Continue to monitor.   Vascular dementia Hx of  dementia, HPI was provided with assistance of staff, continue Memantine 10mg  bid for memory.    GERD (gastroesophageal reflux disease) GERD, managed, continue Omeprazole 20mg  qd. Tachycardia: heart rate is in control on Metoprolol 50mg  qd   Tachycardia Tachycardia: heart rate is in control on Metoprolol 50mg  qd. EKG: vent rate 80 bmp, no significance ST-T changes    Hyponatremia 03/18/18 serum Na 132, range 127-133 baseline.      Family/ staff Communication: plan of care reviewed with the patient and charge nurse.   Labs/tests ordered:  CXR, EKG, CBC, BMP done 03/18/18  Time spend 25 minutes.

## 2018-03-18 NOTE — Assessment & Plan Note (Signed)
GERD, managed, continue Omeprazole 20mg  qd. Tachycardia: heart rate is in control on Metoprolol 50mg  qd

## 2018-03-18 NOTE — Assessment & Plan Note (Signed)
Hx of dementia, HPI was provided with assistance of staff, continue Memantine 10mg  bid for memory.

## 2018-03-18 NOTE — Assessment & Plan Note (Signed)
03/18/18 serum Na 132, range 127-133 baseline.

## 2018-03-18 NOTE — Assessment & Plan Note (Signed)
Reported cough, diminished lung sounds, and tachycardia 03/17/18 evening. DuoNeb q6h prn, Vs qshift x72 hrs, EKG: vent rate 80 bmp, no significance ST-T changes, CBC wbc 8.7, Hgb 12.6, plt 308, Na 132, K 4.4, Bun 12, creat 0.62. CXR no acute cardiopulmonary disease. She is afebrile, in her usual state of health today 03/18/18. Continue to monitor.

## 2018-03-19 ENCOUNTER — Encounter: Payer: Self-pay | Admitting: Nurse Practitioner

## 2018-03-19 DIAGNOSIS — R278 Other lack of coordination: Secondary | ICD-10-CM | POA: Diagnosis not present

## 2018-03-19 DIAGNOSIS — Z7389 Other problems related to life management difficulty: Secondary | ICD-10-CM | POA: Diagnosis not present

## 2018-03-19 DIAGNOSIS — M6281 Muscle weakness (generalized): Secondary | ICD-10-CM | POA: Diagnosis not present

## 2018-03-19 DIAGNOSIS — M79622 Pain in left upper arm: Secondary | ICD-10-CM | POA: Diagnosis not present

## 2018-03-19 DIAGNOSIS — Z9181 History of falling: Secondary | ICD-10-CM | POA: Diagnosis not present

## 2018-03-19 DIAGNOSIS — S42302D Unspecified fracture of shaft of humerus, left arm, subsequent encounter for fracture with routine healing: Secondary | ICD-10-CM | POA: Diagnosis not present

## 2018-03-24 DIAGNOSIS — M6281 Muscle weakness (generalized): Secondary | ICD-10-CM | POA: Diagnosis not present

## 2018-03-24 DIAGNOSIS — D509 Iron deficiency anemia, unspecified: Secondary | ICD-10-CM | POA: Diagnosis not present

## 2018-03-24 DIAGNOSIS — Z9181 History of falling: Secondary | ICD-10-CM | POA: Diagnosis not present

## 2018-03-24 DIAGNOSIS — I10 Essential (primary) hypertension: Secondary | ICD-10-CM | POA: Diagnosis not present

## 2018-03-24 DIAGNOSIS — Z7389 Other problems related to life management difficulty: Secondary | ICD-10-CM | POA: Diagnosis not present

## 2018-03-24 DIAGNOSIS — M79622 Pain in left upper arm: Secondary | ICD-10-CM | POA: Diagnosis not present

## 2018-03-24 DIAGNOSIS — J45901 Unspecified asthma with (acute) exacerbation: Secondary | ICD-10-CM | POA: Diagnosis not present

## 2018-03-24 DIAGNOSIS — N390 Urinary tract infection, site not specified: Secondary | ICD-10-CM | POA: Diagnosis not present

## 2018-03-24 DIAGNOSIS — R531 Weakness: Secondary | ICD-10-CM | POA: Diagnosis not present

## 2018-03-24 DIAGNOSIS — R278 Other lack of coordination: Secondary | ICD-10-CM | POA: Diagnosis not present

## 2018-03-24 DIAGNOSIS — S42302D Unspecified fracture of shaft of humerus, left arm, subsequent encounter for fracture with routine healing: Secondary | ICD-10-CM | POA: Diagnosis not present

## 2018-03-24 DIAGNOSIS — K5900 Constipation, unspecified: Secondary | ICD-10-CM | POA: Diagnosis not present

## 2018-03-24 DIAGNOSIS — B0239 Other herpes zoster eye disease: Secondary | ICD-10-CM | POA: Diagnosis not present

## 2018-03-25 DIAGNOSIS — R278 Other lack of coordination: Secondary | ICD-10-CM | POA: Diagnosis not present

## 2018-03-25 DIAGNOSIS — S42302D Unspecified fracture of shaft of humerus, left arm, subsequent encounter for fracture with routine healing: Secondary | ICD-10-CM | POA: Diagnosis not present

## 2018-03-25 DIAGNOSIS — M79622 Pain in left upper arm: Secondary | ICD-10-CM | POA: Diagnosis not present

## 2018-03-25 DIAGNOSIS — Z7389 Other problems related to life management difficulty: Secondary | ICD-10-CM | POA: Diagnosis not present

## 2018-03-25 DIAGNOSIS — M6281 Muscle weakness (generalized): Secondary | ICD-10-CM | POA: Diagnosis not present

## 2018-03-25 DIAGNOSIS — Z9181 History of falling: Secondary | ICD-10-CM | POA: Diagnosis not present

## 2018-03-27 DIAGNOSIS — M79622 Pain in left upper arm: Secondary | ICD-10-CM | POA: Diagnosis not present

## 2018-03-27 DIAGNOSIS — S42302D Unspecified fracture of shaft of humerus, left arm, subsequent encounter for fracture with routine healing: Secondary | ICD-10-CM | POA: Diagnosis not present

## 2018-03-27 DIAGNOSIS — Z9181 History of falling: Secondary | ICD-10-CM | POA: Diagnosis not present

## 2018-03-27 DIAGNOSIS — R278 Other lack of coordination: Secondary | ICD-10-CM | POA: Diagnosis not present

## 2018-03-27 DIAGNOSIS — Z7389 Other problems related to life management difficulty: Secondary | ICD-10-CM | POA: Diagnosis not present

## 2018-03-27 DIAGNOSIS — M6281 Muscle weakness (generalized): Secondary | ICD-10-CM | POA: Diagnosis not present

## 2018-03-30 DIAGNOSIS — M79622 Pain in left upper arm: Secondary | ICD-10-CM | POA: Diagnosis not present

## 2018-03-30 DIAGNOSIS — Z7389 Other problems related to life management difficulty: Secondary | ICD-10-CM | POA: Diagnosis not present

## 2018-03-30 DIAGNOSIS — M6281 Muscle weakness (generalized): Secondary | ICD-10-CM | POA: Diagnosis not present

## 2018-03-30 DIAGNOSIS — R278 Other lack of coordination: Secondary | ICD-10-CM | POA: Diagnosis not present

## 2018-03-30 DIAGNOSIS — S42302D Unspecified fracture of shaft of humerus, left arm, subsequent encounter for fracture with routine healing: Secondary | ICD-10-CM | POA: Diagnosis not present

## 2018-03-30 DIAGNOSIS — Z9181 History of falling: Secondary | ICD-10-CM | POA: Diagnosis not present

## 2018-04-01 DIAGNOSIS — R278 Other lack of coordination: Secondary | ICD-10-CM | POA: Diagnosis not present

## 2018-04-01 DIAGNOSIS — S42302D Unspecified fracture of shaft of humerus, left arm, subsequent encounter for fracture with routine healing: Secondary | ICD-10-CM | POA: Diagnosis not present

## 2018-04-01 DIAGNOSIS — M6281 Muscle weakness (generalized): Secondary | ICD-10-CM | POA: Diagnosis not present

## 2018-04-01 DIAGNOSIS — M79622 Pain in left upper arm: Secondary | ICD-10-CM | POA: Diagnosis not present

## 2018-04-01 DIAGNOSIS — Z7389 Other problems related to life management difficulty: Secondary | ICD-10-CM | POA: Diagnosis not present

## 2018-04-01 DIAGNOSIS — Z9181 History of falling: Secondary | ICD-10-CM | POA: Diagnosis not present

## 2018-04-02 ENCOUNTER — Non-Acute Institutional Stay (SKILLED_NURSING_FACILITY): Payer: Medicare Other | Admitting: Nurse Practitioner

## 2018-04-02 DIAGNOSIS — D72829 Elevated white blood cell count, unspecified: Secondary | ICD-10-CM

## 2018-04-02 DIAGNOSIS — R278 Other lack of coordination: Secondary | ICD-10-CM | POA: Diagnosis not present

## 2018-04-02 DIAGNOSIS — I10 Essential (primary) hypertension: Secondary | ICD-10-CM

## 2018-04-02 DIAGNOSIS — D509 Iron deficiency anemia, unspecified: Secondary | ICD-10-CM | POA: Diagnosis not present

## 2018-04-02 DIAGNOSIS — M6281 Muscle weakness (generalized): Secondary | ICD-10-CM | POA: Diagnosis not present

## 2018-04-02 DIAGNOSIS — E871 Hypo-osmolality and hyponatremia: Secondary | ICD-10-CM | POA: Diagnosis not present

## 2018-04-02 DIAGNOSIS — R000 Tachycardia, unspecified: Secondary | ICD-10-CM | POA: Diagnosis not present

## 2018-04-02 DIAGNOSIS — M79622 Pain in left upper arm: Secondary | ICD-10-CM | POA: Diagnosis not present

## 2018-04-02 DIAGNOSIS — F418 Other specified anxiety disorders: Secondary | ICD-10-CM

## 2018-04-02 DIAGNOSIS — Z9181 History of falling: Secondary | ICD-10-CM | POA: Diagnosis not present

## 2018-04-02 DIAGNOSIS — F0150 Vascular dementia without behavioral disturbance: Secondary | ICD-10-CM

## 2018-04-02 DIAGNOSIS — Z7389 Other problems related to life management difficulty: Secondary | ICD-10-CM | POA: Diagnosis not present

## 2018-04-02 DIAGNOSIS — S42302D Unspecified fracture of shaft of humerus, left arm, subsequent encounter for fracture with routine healing: Secondary | ICD-10-CM | POA: Diagnosis not present

## 2018-04-02 NOTE — Progress Notes (Signed)
Location:  [facility] Room Number: 42-B Place of Service:  SNF (31) Provider: Coti Burd Bretta Bang NP  Blanchie Serve, MD  Patient Care Team: Blanchie Serve, MD as PCP - General (Internal Medicine) [name], MD as Referring Physician (Ophthalmology)  Extended Emergency Contact Information Primary Emergency Contact: [name] Address: [address]          [place], [place] [address] [name] of [place] Phone: [phone] Mobile Phone: [phone] Relation: Son Secondary Emergency Contact: [name] Address: [address]          [place], [place] [address] [name] of [place] Phone: [phone] Mobile Phone: [phone] Relation: Son  Code Status:  DNR Goals of care: Advanced Directive information Advanced Directives 04/02/2018  Does Patient Have a Medical Advance Directive? Yes  Type of Advance Directive Out of facility DNR (pink MOST or yellow form)  Does patient want to make changes to medical advance directive? No - Patient declined  Copy of [place] in Chart? -  Would patient like information on creating a medical advance directive? -  Pre-existing out of facility DNR order (yellow form or pink MOST form) Yellow form placed in chart (order not valid for inpatient use);Pink MOST form placed in chart (order not valid for inpatient use)     Chief Complaint  Patient presents with  . Medical Management of Chronic Issues    F/u-GERD, dementia, tachycardia,Chronic bronchitiis, HTN    HPI:  Pt is a 84 y.o. female seen today for medical management of chronic diseases.     The patient has Hx of HTN, tachycardia, blood pressure and heart rate are controlled on Metoprolol 50mg  qd. She resides in [facility] for safety and care assistance, needs assistance for transfer, w/c for mobility, on Memantine 10mg  bid for memory. Her mood is stable on Lorazepam 0.25mg  qd. Hx of IDA, on Fe 325mg  qd, last Hgb 11.9  03/10/18.Marland Kitchen Hx of hyponatremia, last 2 serum Na 131 11/13/17 and 11/18/17.  Past Medical History:  Diagnosis Date  . Anemia, iron deficiency    takes Ferrous Sulfate daily  . Depression    takes Effexor daily  . Gait disorder 04/21/2014  . GERD (gastroesophageal reflux disease)    takes Omeprazole daily  . Herpes ocular    history of-takes Acyclovir daily  . High cholesterol    takes Atorvastatin daily  . History of hiatal hernia   . Hypertension    takes Metoprolol daily  . Insomnia    takes Xanax nightly  . Osteoarthritis of knee    bilateral  . Sciatic pain    Past Surgical History:  Procedure Laterality Date  . ABDOMINAL HYSTERECTOMY  1995   partial  . CARPAL TUNNEL RELEASE Right 07/21/2007  . CARPAL TUNNEL RELEASE Left 09/24/2007  . COLONOSCOPY    . DESCEMETS STRIPPING AUTOMATED ENDOTHELIAL KERATOPLASTY Left 03/14/2011  . DESCEMETS STRIPPING AUTOMATED ENDOTHELIAL KERATOPLASTY Right 08/20/2012  . EYE SURGERY Bilateral    cataract surgery  . EYE SURGERY Left    corneal transplant  . HERNIA REPAIR    . HIP ARTHROPLASTY Left 06/13/2013   Procedure: ARTHROPLASTY BIPOLAR HIP; Injection left shoulder;  Surgeon: Johnny Bridge, MD;  Location: [facility];  Service: Orthopedics;  Laterality: Left;  . JOINT REPLACEMENT     bilateral knees, left knee  . KNEE ARTHROSCOPY Right 05/11/2001  . KYPHOPLASTY N/A 04/10/2015   Procedure: T11 Kyphoplasty;  Surgeon: Jovita Gamma, MD;  Location: [facility]UR[facility]S;  Service: Neurosurgery;  Laterality: N/A;  T11 Kyphoplasty  . KYPHOPLASTY N/A 05/06/2015   Procedure: KYPHOPLASTY Thoracic twelve;  Surgeon: Jovita Gamma, MD;  Location: [facility];  Service: Neurosurgery;  Laterality: N/A;  T12 Kyphoplasty  . LAPAROSCOPIC NISSEN FUNDOPLICATION  5/40/9811  . LUMBAR LAMINECTOMY/DECOMPRESSION MICRODISCECTOMY  08/29/2010   L2-S1  . SHOULDER ARTHROSCOPY WITH ROTATOR CUFF REPAIR AND SUBACROMIAL DECOMPRESSION Right 01/01/2000  . TONSILLECTOMY     as child  .  TOTAL HIP REVISION Left 07/08/2014   Procedure: TOTAL HIP REVISION;  Surgeon: Kerin Salen, MD;  Location: [facility];  Service: Orthopedics;  Laterality: Left;  . TOTAL KNEE ARTHROPLASTY Left 05/14/2004  . TOTAL KNEE ARTHROPLASTY  12/23/2011   Procedure: TOTAL KNEE ARTHROPLASTY;  Surgeon: Lorn Junes, MD;  Location: [facility];  Service: Orthopedics;  Laterality: Right;  DR [place] THIS CASE  . TRIGGER FINGER RELEASE Right 07/21/2007   thumb  . TRIGGER FINGER RELEASE Left 09/24/2007   middle finger  . TRIGGER FINGER RELEASE Right 03/10/2008   ring and little fingers  . TRIGGER FINGER RELEASE Right 09/02/2012   Procedure: RELEASE TRIGGER FINGER/A-1 PULLEY RIGHT INDEX FINGER;  Surgeon: Wynonia Sours, MD;  Location: [facility];  Service: Orthopedics;  Laterality: Right;  . TRIGGER FINGER RELEASE Left 12/22/2012   Procedure: RELEASE TRIGGER FINGER/A-1 PULLEY LEFT RING FINGER;  Surgeon: Wynonia Sours, MD;  Location: [facility];  Service: Orthopedics;  Laterality: Left;  Left     Allergies  Allergen Reactions  . Morphine And Related Other (See Comments)    AGITATION, STRANGE DREAMS  . Scopolamine Other (See Comments)    MENTAL CHANGES  . Atorvastatin Other (See Comments)    unknown  . Morphine Other (See Comments)    Disoriented, mood changes  . Nsaids Other (See Comments)    unknown  . Pravachol [Pravastatin Sodium] Other (See Comments)    unknown  . Pravastatin Other (See Comments)    unknown  . Teriparatide Other (See Comments)    unknown    Outpatient Encounter Medications as of 04/02/2018  Medication Sig  . acetaminophen (TYLENOL) 500 MG tablet Take 500 mg by mouth every 6 (six) hours as needed for mild pain or moderate pain.  Marland Kitchen albuterol (PROVENTIL) (2.5 MG/3ML) 0.083% nebulizer solution Take 2.5 mg by nebulization every 4 (four) hours as needed for wheezing or shortness of breath.  . Artificial Tear Solution (GENTEAL TEARS) 0.1-0.2-0.3 %  SOLN Apply 1 drop to eye at bedtime as needed.  Marland Kitchen aspirin EC 81 MG tablet Take 81 mg by mouth daily.  . Cholecalciferol (VITAMIN D) 2000 units CAPS Take 2,000 Units by mouth daily.  Marland Kitchen Dextran 70-Hypromellose (ARTIFICIAL TEARS) 0.1-0.3 % SOLN Apply 1 drop to eye 4 (four) times daily.  Marland Kitchen docusate sodium (COLACE) 100 MG capsule Take 1 capsule by mouth daily as needed for mild constipation.   . feeding supplement (BOOST / RESOURCE BREEZE) LIQD Take 1 Container by mouth 2 (two) times daily. For weight loss   . ferrous sulfate 325 (65 FE) MG tablet Take 325 mg by mouth daily.  . fluticasone furoate-vilanterol (BREO ELLIPTA) 100-25 MCG/INH AEPB Inhale 1 puff into the lungs daily.  Marland Kitchen loratadine (CLARITIN) 10 MG tablet Take 10 mg by mouth daily as needed for allergies.  Marland Kitchen LORazepam (ATIVAN) 0.5 MG tablet Take 0.25 mg by mouth at bedtime.   . magnesium hydroxide (MILK OF MAGNESIA) 400 MG/5ML suspension Take 15 mLs  by mouth at bedtime as needed for mild constipation.   . memantine (NAMENDA) 10 MG tablet Take 10 mg by mouth 2 (two) times daily.  . metoprolol tartrate (LOPRESSOR) 25 MG tablet Take 25 mg by mouth at bedtime.   . metoprolol tartrate (LOPRESSOR) 50 MG tablet Take 50 mg by mouth every morning.  . nitroGLYCERIN (NITROSTAT) 0.4 MG SL tablet Place 0.4 mg under the tongue every 5 (five) minutes as needed for chest pain.  Marland Kitchen omeprazole (PRILOSEC) 10 MG capsule Take 10 mg by mouth daily.  Marland Kitchen venlafaxine XR (EFFEXOR-XR) 150 MG 24 hr capsule Take 150 mg by mouth at bedtime.   . [DISCONTINUED] omeprazole (PRILOSEC) 20 MG capsule Take 20 mg by mouth daily.    No facility-administered encounter medications on file as of 04/02/2018.   ROS was provided with assistance of staff  Review of Systems  Constitutional: Negative for activity change, appetite change, chills, diaphoresis, fatigue, fever and unexpected weight change.  HENT: Positive for hearing loss. Negative for congestion and voice change.     Respiratory: Positive for cough. Negative for shortness of breath and wheezing.        Chronic hacking cough.   Cardiovascular: Negative for chest pain, palpitations and leg swelling.  Gastrointestinal: Negative for abdominal distention, abdominal pain, constipation, diarrhea, nausea and vomiting.  Genitourinary: Negative for dysuria and urgency.  Musculoskeletal: Positive for arthralgias, back pain and gait problem.  Skin: Negative for color change and pallor.  Neurological: Negative for dizziness, facial asymmetry, speech difficulty, weakness and headaches.       Dementia  Psychiatric/Behavioral: Positive for confusion. Negative for agitation, behavioral problems, hallucinations and sleep disturbance. The patient is not nervous/anxious.     Immunization History  Administered Date(s) Administered  . Influenza-Unspecified 04/14/2017  . Pneumococcal Polysaccharide-23 06/14/2013   Pertinent  Health Maintenance Due  Topic Date Due  . PNA vac Low Risk Adult (2 of 2 - PCV13) 06/14/2014  . INFLUENZA VACCINE  01/22/2018  . DEXA SCAN  Completed   Fall Risk  09/19/2017  Falls in the past year? No   Functional Status Survey:    Vitals:   04/02/18 1051  BP: 130/60  Pulse: 70  Resp: 20  Temp: 98.3 F (36.8 C)  SpO2: 95%  Weight: 120 lb 3.2 oz (54.5 kg)  Height: 5\' 1"  (1.549 m)   Body mass index is 22.71 kg/m. Physical Exam  Constitutional: She appears well-developed and well-nourished.  HENT:  Head: Normocephalic and atraumatic.  Eyes: Pupils are equal, round, and reactive to light. EOM are normal.  Neck: Normal range of motion. Neck supple. No JVD present. No thyromegaly present.  Cardiovascular: Normal rate and regular rhythm.  No murmur heard. Baseline HR high 90s  Pulmonary/Chest: She has no wheezes. She has rales.  Poor air entry R+L lungs. Bibasilar dry rales.  Abdominal: Soft. She exhibits no distension. There is no tenderness. There is no rebound and no guarding.   Musculoskeletal: She exhibits no edema.  Needs assistance with transfer. Self propels w/c for mobility.   Neurological: She is alert. No cranial nerve deficit. She exhibits normal muscle tone. Coordination normal.  Oriented to person and place.   Skin: Skin is warm and dry.  Psychiatric: She has a normal mood and affect. Her behavior is normal.    Labs reviewed: Recent Labs    07/30/17 1113 08/03/17 1851 08/08/17 1135  11/04/17 11/13/17 11/18/17  NA 130* 131* 130*   < > 133* 131* 131*  131*  K 4.3 4.4 4.5   < > 3.9 4.3 4.7  4.7  CL 98* 96* 96*  --  98 98 99  CO2 21* 26 22  --  26 24 26   GLUCOSE 94 96 91  --   --   --   --   BUN 14 17 11    < > 11 12 17  17   CREATININE 0.79 0.72 0.67   < > 0.6 0.6 0.6  0.6  CALCIUM 8.9 9.1 9.1  --  9.2 9.0 8.8   < > = values in this interval not displayed.   Recent Labs    07/30/17 1113 08/03/17 1851  11/04/17 11/13/17 11/18/17  AST 24 23   < > 20 18 18  18   ALT 16 16   < > 15 13 13  13   ALKPHOS 148* 142*   < > 207* 158* 156*  156*  BILITOT 0.3 0.3  --   --   --   --   PROT 6.6 7.5  --   --   --   --   ALBUMIN 3.4* 4.0  --  4.2 4.0 3.5   < > = values in this interval not displayed.   Recent Labs    07/30/17 1113 08/03/17 1851 08/08/17 1135  10/07/17 10/30/17 11/13/17  WBC 8.4 6.8 8.0   < > 5.5 5.3 5.4  NEUTROABS 5.2 3.5 4.8  --   --   --   --   HGB 12.1 13.0 13.1   < > 12.0 11.6* 12.9  HCT 35.8* 38.8 39.5   < > 34* 34* 38  MCV 96.5 97.5 97.8  --   --   --   --   PLT 325 375 318   < > 340 363 369   < > = values in this interval not displayed.   Lab Results  Component Value Date   TSH 2.99 11/13/2017   Lab Results  Component Value Date   HGBA1C 5.8 06/29/2013   No results found for: CHOL, HDL, LDLCALC, LDLDIRECT, TRIG, CHOLHDL  Significant Diagnostic Results in last 30 days:  No results found.  Assessment/Plan Hypertension blood pressure is in  Control, continue Metoprolol 50mg  qd.   Tachycardia heart rate is  controlled, continue Metoprolol 50mg  qd.  Vascular dementia She resides in SN[facility] for safety and care assistance, needs assistance with transfer, w/c for mobility, continue Memantine 10mg  bid for memory.  Depression with anxiety Her mood is stable, continue Lorazepam 0.25mg  qd.   Iron deficiency anemia Hx of IDA, on Fe 325mg  qd, last Hgb 11.9 03/10/18.   Hyponatremia Hx of hyponatremia, last 2 serum Na 131 11/13/17 and 11/18/17.    Leukocytosis Resolved, last wbc 5.3 03/10/18     Family/ staff Communication: plan of care reviewed with the patient and charge nurse.  Labs/tests ordered:  None  Time spend 25 minutes.

## 2018-04-02 NOTE — Assessment & Plan Note (Signed)
Resolved, last wbc 5.3 03/10/18

## 2018-04-02 NOTE — Assessment & Plan Note (Signed)
blood pressure is in  Control, continue Metoprolol 50mg  qd.

## 2018-04-02 NOTE — Assessment & Plan Note (Signed)
Her mood is stable, continue Lorazepam 0.25mg  qd.

## 2018-04-02 NOTE — Assessment & Plan Note (Signed)
Hx of IDA, on Fe 325mg  qd, last Hgb 11.9 03/10/18.

## 2018-04-02 NOTE — Assessment & Plan Note (Addendum)
She resides in [facility] for safety and care assistance, needs assistance with transfer, w/c for mobility, continue Memantine 10mg  bid for memory.

## 2018-04-02 NOTE — Assessment & Plan Note (Signed)
heart rate is controlled, continue Metoprolol 50mg  qd.

## 2018-04-02 NOTE — Assessment & Plan Note (Addendum)
Hx of hyponatremia, last 2 serum Na 131 11/13/17 and 11/18/17.

## 2018-04-06 ENCOUNTER — Encounter: Payer: Self-pay | Admitting: Nurse Practitioner

## 2018-04-06 ENCOUNTER — Non-Acute Institutional Stay (SKILLED_NURSING_FACILITY): Payer: Medicare Other | Admitting: Nurse Practitioner

## 2018-04-06 DIAGNOSIS — J42 Unspecified chronic bronchitis: Secondary | ICD-10-CM | POA: Diagnosis not present

## 2018-04-06 DIAGNOSIS — R300 Dysuria: Secondary | ICD-10-CM

## 2018-04-06 DIAGNOSIS — F0150 Vascular dementia without behavioral disturbance: Secondary | ICD-10-CM

## 2018-04-06 DIAGNOSIS — R05 Cough: Secondary | ICD-10-CM | POA: Diagnosis not present

## 2018-04-06 NOTE — Assessment & Plan Note (Addendum)
Coughing thick white secretions, she denied chest pain/pressure, palpitation, she is afebrile, no O2 desaturation, CXR to evaluate further 04/06/18 CXR no acute cardiopulmonary disease.

## 2018-04-06 NOTE — Assessment & Plan Note (Signed)
Hx of urinary frequency, new onset of pain and burning sensation upon urination, she denied suprapubic or CVA tenderness, she is afebrile, will update CBC/diff, UA C/S, CMP

## 2018-04-06 NOTE — Progress Notes (Signed)
Location:  [place] Room Number: 42-B Place of Service:  SNF (31) Provider:  Seymone Forlenza, ManXie  NP  Blanchie Serve, MD  Patient Care Team: Blanchie Serve, MD as PCP - General (Internal Medicine) [name], MD as Referring Physician (Ophthalmology)  Extended Emergency Contact Information Primary Emergency Contact: [name] Address: [address]          [place], [place] [address] [name] of [place] Phone: [phone] Mobile Phone: [phone] Relation: Son Secondary Emergency Contact: [name] Address: [address]          [place],  [address] [name] of [place] Phone: [phone] Mobile Phone: [phone] Relation: Son  Code Status:  DNR Goals of care: Advanced Directive information Advanced Directives 04/02/2018  Does Patient Have a Medical Advance Directive? Yes  Type of Advance Directive Out of facility DNR (pink MOST or yellow form)  Does patient want to make changes to medical advance directive? No - Patient declined  Copy of [place] in Chart? -  Would patient like information on creating a medical advance directive? -  Pre-existing out of facility DNR order (yellow form or pink MOST form) Yellow form placed in chart (order not valid for inpatient use);Pink MOST form placed in chart (order not valid for inpatient use)     Chief Complaint  Patient presents with  . Acute Visit    C/o- cough    HPI:  Pt is a 84 y.o. female seen today for an acute visit for coughing thick white secretions, she denied chest pain/pressure, palpitation, she is afebrile, no O2 desaturation, Hx of chronic bronchitis, prn Albuterol Neb q4h available to her. Hx of urinary frequency, new onset of pain and burning sensation upon urination, she denied suprapubic or CVA tenderness, she is afebrile. HPI was provided with assistance of staff due to her dementia. She resides in [facility] for safety and care  assistance, on Memantine 10mg  bid for memory.    Past Medical History:  Diagnosis Date  . Anemia, iron deficiency    takes Ferrous Sulfate daily  . Depression    takes Effexor daily  . Gait disorder 04/21/2014  . GERD (gastroesophageal reflux disease)    takes Omeprazole daily  . Herpes ocular    history of-takes Acyclovir daily  . High cholesterol    takes Atorvastatin daily  . History of hiatal hernia   . Hypertension    takes Metoprolol daily  . Insomnia    takes Xanax nightly  . Osteoarthritis of knee    bilateral  . Sciatic pain    Past Surgical History:  Procedure Laterality Date  . ABDOMINAL HYSTERECTOMY  1995   partial  . CARPAL TUNNEL RELEASE Right 07/21/2007  . CARPAL TUNNEL RELEASE Left 09/24/2007  . COLONOSCOPY    . DESCEMETS STRIPPING AUTOMATED ENDOTHELIAL KERATOPLASTY Left 03/14/2011  . DESCEMETS STRIPPING AUTOMATED ENDOTHELIAL KERATOPLASTY Right 08/20/2012  . EYE SURGERY Bilateral    cataract surgery  . EYE SURGERY Left    corneal transplant  . HERNIA REPAIR    . HIP ARTHROPLASTY Left 06/13/2013   Procedure: ARTHROPLASTY BIPOLAR HIP; Injection left shoulder;  Surgeon: Johnny Bridge, MD;  Location: [place];  Service: Orthopedics;  Laterality: Left;  . JOINT REPLACEMENT     bilateral knees, left knee  . KNEE ARTHROSCOPY Right 05/11/2001  . KYPHOPLASTY N/A 04/10/2015   Procedure: T11 Kyphoplasty;  Surgeon: Jovita Gamma, MD;  Location: [place] [facility];  Service: Neurosurgery;  Laterality:  N/A;  T11 Kyphoplasty  . KYPHOPLASTY N/A 05/06/2015   Procedure: KYPHOPLASTY Thoracic twelve;  Surgeon: Jovita Gamma, MD;  Location: [place] [facility];  Service: Neurosurgery;  Laterality: N/A;  T12 Kyphoplasty  . LAPAROSCOPIC NISSEN FUNDOPLICATION  10/07/6061  . LUMBAR LAMINECTOMY/DECOMPRESSION MICRODISCECTOMY  08/29/2010   L2-S1  . SHOULDER ARTHROSCOPY WITH ROTATOR CUFF REPAIR AND SUBACROMIAL DECOMPRESSION Right 01/01/2000  . TONSILLECTOMY     as child  . TOTAL HIP REVISION  Left 07/08/2014   Procedure: TOTAL HIP REVISION;  Surgeon: Kerin Salen, MD;  Location: [place];  Service: Orthopedics;  Laterality: Left;  . TOTAL KNEE ARTHROPLASTY Left 05/14/2004  . TOTAL KNEE ARTHROPLASTY  12/23/2011   Procedure: TOTAL KNEE ARTHROPLASTY;  Surgeon: Lorn Junes, MD;  Location: [place];  Service: Orthopedics;  Laterality: Right;  [facility] [place] THIS CASE  . TRIGGER FINGER RELEASE Right 07/21/2007   thumb  . TRIGGER FINGER RELEASE Left 09/24/2007   middle finger  . TRIGGER FINGER RELEASE Right 03/10/2008   ring and little fingers  . TRIGGER FINGER RELEASE Right 09/02/2012   Procedure: RELEASE TRIGGER FINGER/A-1 PULLEY RIGHT INDEX FINGER;  Surgeon: Wynonia Sours, MD;  Location: [place];  Service: Orthopedics;  Laterality: Right;  . TRIGGER FINGER RELEASE Left 12/22/2012   Procedure: RELEASE TRIGGER FINGER/A-1 PULLEY LEFT RING FINGER;  Surgeon: Wynonia Sours, MD;  Location: [place];  Service: Orthopedics;  Laterality: Left;  Left     Allergies  Allergen Reactions  . Morphine And Related Other (See Comments)    AGITATION, STRANGE DREAMS  . Scopolamine Other (See Comments)    MENTAL CHANGES  . Atorvastatin Other (See Comments)    unknown  . Morphine Other (See Comments)    Disoriented, mood changes  . Nsaids Other (See Comments)    unknown  . Pravachol [Pravastatin Sodium] Other (See Comments)    unknown  . Pravastatin Other (See Comments)    unknown  . Teriparatide Other (See Comments)    unknown    Outpatient Encounter Medications as of 04/06/2018  Medication Sig  . acetaminophen (TYLENOL) 500 MG tablet Take 500 mg by mouth every 6 (six) hours as needed for mild pain or moderate pain.  Marland Kitchen albuterol (PROVENTIL) (2.5 MG/3ML) 0.083% nebulizer solution Take 2.5 mg by nebulization every 4 (four) hours as needed for wheezing or shortness of breath.  . Artificial Tear Solution (GENTEAL TEARS) 0.1-0.2-0.3 % SOLN Apply 1 drop to  eye at bedtime as needed.  Marland Kitchen aspirin EC 81 MG tablet Take 81 mg by mouth daily.  . Cholecalciferol (VITAMIN D) 2000 units CAPS Take 2,000 Units by mouth daily.  Marland Kitchen Dextran 70-Hypromellose (ARTIFICIAL TEARS) 0.1-0.3 % SOLN Apply 1 drop to eye 4 (four) times daily.  Marland Kitchen docusate sodium (COLACE) 100 MG capsule Take 1 capsule by mouth daily as needed for mild constipation.   . feeding supplement (BOOST / RESOURCE BREEZE) LIQD Take 1 Container by mouth 2 (two) times daily. For weight loss   . ferrous sulfate 325 (65 FE) MG tablet Take 325 mg by mouth daily.  . fluticasone furoate-vilanterol (BREO ELLIPTA) 100-25 MCG/INH AEPB Inhale 1 puff into the lungs daily.  Marland Kitchen loratadine (CLARITIN) 10 MG tablet Take 10 mg by mouth daily as needed for allergies.  Marland Kitchen LORazepam (ATIVAN) 0.5 MG tablet Take 0.25 mg by mouth at bedtime.   . magnesium hydroxide (MILK OF MAGNESIA) 400 MG/5ML suspension Take 15 mLs by mouth at bedtime  as needed for mild constipation.   . memantine (NAMENDA) 10 MG tablet Take 10 mg by mouth 2 (two) times daily.  . metoprolol tartrate (LOPRESSOR) 25 MG tablet Take 25 mg by mouth at bedtime.   . metoprolol tartrate (LOPRESSOR) 50 MG tablet Take 50 mg by mouth every morning.  . nitroGLYCERIN (NITROSTAT) 0.4 MG SL tablet Place 0.4 mg under the tongue every 5 (five) minutes as needed for chest pain.  Marland Kitchen omeprazole (PRILOSEC) 10 MG capsule Take 10 mg by mouth daily.  Marland Kitchen venlafaxine XR (EFFEXOR-XR) 150 MG 24 hr capsule Take 150 mg by mouth at bedtime.    No facility-administered encounter medications on file as of 04/06/2018.    ROS was provided with assistance of staff Review of Systems  Constitutional: Positive for fatigue. Negative for activity change, appetite change, chills, diaphoresis and fever.  HENT: Positive for hearing loss. Negative for congestion and voice change.   Respiratory: Positive for cough. Negative for shortness of breath and wheezing.   Cardiovascular: Negative for chest pain,  palpitations and leg swelling.  Gastrointestinal: Negative for abdominal distention, abdominal pain, constipation, diarrhea, nausea and vomiting.  Genitourinary: Positive for dysuria, frequency and urgency. Negative for difficulty urinating and hematuria.  Musculoskeletal: Positive for arthralgias and gait problem. Negative for joint swelling.  Neurological: Negative for dizziness, speech difficulty, weakness and headaches.       Dementia  Psychiatric/Behavioral: Positive for confusion. Negative for agitation, behavioral problems, hallucinations and sleep disturbance. The patient is not nervous/anxious.     Immunization History  Administered Date(s) Administered  . Influenza-Unspecified 04/14/2017  . Pneumococcal Polysaccharide-23 06/14/2013   Pertinent  Health Maintenance Due  Topic Date Due  . PNA vac Low Risk Adult (2 of 2 - PCV13) 06/14/2014  . INFLUENZA VACCINE  01/22/2018  . DEXA SCAN  Completed   Fall Risk  09/19/2017  Falls in the past year? No   Functional Status Survey:    Vitals:   04/06/18 1642  BP: 120/70  Pulse: 76  Resp: 20  Temp: 98.1 F (36.7 C)  SpO2: 95%  Weight: 120 lb 3.2 oz (54.5 kg)   Body mass index is 22.71 kg/m. Physical Exam  Constitutional: She appears well-developed and well-nourished.  HENT:  Head: Normocephalic and atraumatic.  Eyes: Pupils are equal, round, and reactive to light. EOM are normal.  Neck: Normal range of motion. Neck supple. No JVD present. No thyromegaly present.  Cardiovascular: Normal rate and regular rhythm.  No murmur heard. HR in 90s is her baseline.   Pulmonary/Chest: She has no wheezes. She has rales.  Poor air entry R+L lungs. Bibasilar rales.   Abdominal: Soft. Bowel sounds are normal. She exhibits no distension. There is no tenderness. There is no rebound and no guarding.  Musculoskeletal: She exhibits no edema.  Needs assistance with transfer. W/c for mobility.   Neurological: She is alert. No cranial nerve  deficit. She exhibits normal muscle tone. Coordination normal.  Oriented to person and place  Skin: Skin is warm and dry.  Psychiatric: She has a normal mood and affect. Her behavior is normal.    Labs reviewed: Recent Labs    07/30/17 1113 08/03/17 1851 08/08/17 1135  11/13/17 11/18/17 03/18/18  NA 130* 131* 130*   < > 131* 131*  131* 132*  K 4.3 4.4 4.5   < > 4.3 4.7  4.7 4.4  CL 98* 96* 96*   < > 98 99 97  CO2 21* 26 22   < >  24 26 28   GLUCOSE 94 96 91  --   --   --   --   BUN 14 17 11    < > 12 17  17 12   CREATININE 0.79 0.72 0.67   < > 0.6 0.6  0.6 0.6  CALCIUM 8.9 9.1 9.1   < > 9.0 8.8 9.1   < > = values in this interval not displayed.   Recent Labs    07/30/17 1113 08/03/17 1851  11/04/17 11/13/17 11/18/17  AST 24 23   < > 20 18 18  18   ALT 16 16   < > 15 13 13  13   ALKPHOS 148* 142*   < > 207* 158* 156*  156*  BILITOT 0.3 0.3  --   --   --   --   PROT 6.6 7.5  --   --   --   --   ALBUMIN 3.4* 4.0  --  4.2 4.0 3.5   < > = values in this interval not displayed.   Recent Labs    07/30/17 1113 08/03/17 1851 08/08/17 1135  10/30/17 11/13/17 03/18/18  WBC 8.4 6.8 8.0   < > 5.3 5.4 8.7  NEUTROABS 5.2 3.5 4.8  --   --   --   --   HGB 12.1 13.0 13.1   < > 11.6* 12.9 12.6  HCT 35.8* 38.8 39.5   < > 34* 38 37  MCV 96.5 97.5 97.8  --   --   --   --   PLT 325 375 318   < > 363 369 308   < > = values in this interval not displayed.   Lab Results  Component Value Date   TSH 2.99 11/13/2017   Lab Results  Component Value Date   HGBA1C 5.8 06/29/2013   No results found for: CHOL, HDL, LDLCALC, LDLDIRECT, TRIG, CHOLHDL  Significant Diagnostic Results in last 30 days:  No results found.  Assessment/Plan Chronic bronchitis (HCC) Coughing thick white secretions, she denied chest pain/pressure, palpitation, she is afebrile, no O2 desaturation, CXR to evaluate further 04/06/18 CXR no acute cardiopulmonary disease.   Dysuria Hx of urinary frequency, new onset  of pain and burning sensation upon urination, she denied suprapubic or CVA tenderness, she is afebrile, will update CBC/diff, UA C/S, CMP  Vascular dementia She resides in SN[facility] for safety and care assistance, on Memantine 10mg  bid for memory.      Family/ staff Communication: plan of care reviewed with the patient and charge nurse.   Labs/tests ordered: CBC/diff, CXR, CMP, UA C/S  Time spend 25 minutes.

## 2018-04-07 ENCOUNTER — Other Ambulatory Visit: Payer: Self-pay | Admitting: *Deleted

## 2018-04-07 DIAGNOSIS — R278 Other lack of coordination: Secondary | ICD-10-CM | POA: Diagnosis not present

## 2018-04-07 DIAGNOSIS — R300 Dysuria: Secondary | ICD-10-CM | POA: Diagnosis not present

## 2018-04-07 DIAGNOSIS — Z9181 History of falling: Secondary | ICD-10-CM | POA: Diagnosis not present

## 2018-04-07 DIAGNOSIS — S42302D Unspecified fracture of shaft of humerus, left arm, subsequent encounter for fracture with routine healing: Secondary | ICD-10-CM | POA: Diagnosis not present

## 2018-04-07 DIAGNOSIS — J42 Unspecified chronic bronchitis: Secondary | ICD-10-CM | POA: Diagnosis not present

## 2018-04-07 DIAGNOSIS — M79622 Pain in left upper arm: Secondary | ICD-10-CM | POA: Diagnosis not present

## 2018-04-07 DIAGNOSIS — M6281 Muscle weakness (generalized): Secondary | ICD-10-CM | POA: Diagnosis not present

## 2018-04-07 DIAGNOSIS — Z7389 Other problems related to life management difficulty: Secondary | ICD-10-CM | POA: Diagnosis not present

## 2018-04-07 LAB — BASIC METABOLIC PANEL
CALCIUM: 9.1
CHLORIDE: 97
CO2: 28
GFR CALC NON AF AMER: 82

## 2018-04-07 NOTE — Assessment & Plan Note (Signed)
She resides in [facility] for safety and care assistance, on Memantine 10mg  bid for memory.

## 2018-04-08 ENCOUNTER — Encounter: Payer: Self-pay | Admitting: Nurse Practitioner

## 2018-04-08 DIAGNOSIS — M6281 Muscle weakness (generalized): Secondary | ICD-10-CM | POA: Diagnosis not present

## 2018-04-08 DIAGNOSIS — S42302D Unspecified fracture of shaft of humerus, left arm, subsequent encounter for fracture with routine healing: Secondary | ICD-10-CM | POA: Diagnosis not present

## 2018-04-08 DIAGNOSIS — M79622 Pain in left upper arm: Secondary | ICD-10-CM | POA: Diagnosis not present

## 2018-04-08 DIAGNOSIS — R278 Other lack of coordination: Secondary | ICD-10-CM | POA: Diagnosis not present

## 2018-04-08 DIAGNOSIS — Z9181 History of falling: Secondary | ICD-10-CM | POA: Diagnosis not present

## 2018-04-08 DIAGNOSIS — Z7389 Other problems related to life management difficulty: Secondary | ICD-10-CM | POA: Diagnosis not present

## 2018-04-09 DIAGNOSIS — M6281 Muscle weakness (generalized): Secondary | ICD-10-CM | POA: Diagnosis not present

## 2018-04-09 DIAGNOSIS — S42302D Unspecified fracture of shaft of humerus, left arm, subsequent encounter for fracture with routine healing: Secondary | ICD-10-CM | POA: Diagnosis not present

## 2018-04-09 DIAGNOSIS — Z9181 History of falling: Secondary | ICD-10-CM | POA: Diagnosis not present

## 2018-04-09 DIAGNOSIS — M79622 Pain in left upper arm: Secondary | ICD-10-CM | POA: Diagnosis not present

## 2018-04-09 DIAGNOSIS — Z7389 Other problems related to life management difficulty: Secondary | ICD-10-CM | POA: Diagnosis not present

## 2018-04-09 DIAGNOSIS — R278 Other lack of coordination: Secondary | ICD-10-CM | POA: Diagnosis not present

## 2018-04-10 DIAGNOSIS — S42302D Unspecified fracture of shaft of humerus, left arm, subsequent encounter for fracture with routine healing: Secondary | ICD-10-CM | POA: Diagnosis not present

## 2018-04-10 DIAGNOSIS — M6281 Muscle weakness (generalized): Secondary | ICD-10-CM | POA: Diagnosis not present

## 2018-04-10 DIAGNOSIS — Z9181 History of falling: Secondary | ICD-10-CM | POA: Diagnosis not present

## 2018-04-10 DIAGNOSIS — Z7389 Other problems related to life management difficulty: Secondary | ICD-10-CM | POA: Diagnosis not present

## 2018-04-10 DIAGNOSIS — R278 Other lack of coordination: Secondary | ICD-10-CM | POA: Diagnosis not present

## 2018-04-10 DIAGNOSIS — M79622 Pain in left upper arm: Secondary | ICD-10-CM | POA: Diagnosis not present

## 2018-04-13 ENCOUNTER — Encounter: Payer: Self-pay | Admitting: Nurse Practitioner

## 2018-04-13 DIAGNOSIS — R278 Other lack of coordination: Secondary | ICD-10-CM | POA: Diagnosis not present

## 2018-04-13 DIAGNOSIS — Z9181 History of falling: Secondary | ICD-10-CM | POA: Diagnosis not present

## 2018-04-13 DIAGNOSIS — M6281 Muscle weakness (generalized): Secondary | ICD-10-CM | POA: Diagnosis not present

## 2018-04-13 DIAGNOSIS — Z7389 Other problems related to life management difficulty: Secondary | ICD-10-CM | POA: Diagnosis not present

## 2018-04-13 DIAGNOSIS — M79622 Pain in left upper arm: Secondary | ICD-10-CM | POA: Diagnosis not present

## 2018-04-13 DIAGNOSIS — S42302D Unspecified fracture of shaft of humerus, left arm, subsequent encounter for fracture with routine healing: Secondary | ICD-10-CM | POA: Diagnosis not present

## 2018-04-15 ENCOUNTER — Encounter: Payer: Self-pay | Admitting: *Deleted

## 2018-04-15 DIAGNOSIS — M79622 Pain in left upper arm: Secondary | ICD-10-CM | POA: Diagnosis not present

## 2018-04-15 DIAGNOSIS — M6281 Muscle weakness (generalized): Secondary | ICD-10-CM | POA: Diagnosis not present

## 2018-04-15 DIAGNOSIS — R278 Other lack of coordination: Secondary | ICD-10-CM | POA: Diagnosis not present

## 2018-04-15 DIAGNOSIS — S42302D Unspecified fracture of shaft of humerus, left arm, subsequent encounter for fracture with routine healing: Secondary | ICD-10-CM | POA: Diagnosis not present

## 2018-04-15 DIAGNOSIS — Z9181 History of falling: Secondary | ICD-10-CM | POA: Diagnosis not present

## 2018-04-15 DIAGNOSIS — Z7389 Other problems related to life management difficulty: Secondary | ICD-10-CM | POA: Diagnosis not present

## 2018-04-16 DIAGNOSIS — Z9181 History of falling: Secondary | ICD-10-CM | POA: Diagnosis not present

## 2018-04-16 DIAGNOSIS — S42302D Unspecified fracture of shaft of humerus, left arm, subsequent encounter for fracture with routine healing: Secondary | ICD-10-CM | POA: Diagnosis not present

## 2018-04-16 DIAGNOSIS — M6281 Muscle weakness (generalized): Secondary | ICD-10-CM | POA: Diagnosis not present

## 2018-04-16 DIAGNOSIS — M79622 Pain in left upper arm: Secondary | ICD-10-CM | POA: Diagnosis not present

## 2018-04-16 DIAGNOSIS — Z7389 Other problems related to life management difficulty: Secondary | ICD-10-CM | POA: Diagnosis not present

## 2018-04-16 DIAGNOSIS — R278 Other lack of coordination: Secondary | ICD-10-CM | POA: Diagnosis not present

## 2018-04-17 DIAGNOSIS — R278 Other lack of coordination: Secondary | ICD-10-CM | POA: Diagnosis not present

## 2018-04-17 DIAGNOSIS — Z9181 History of falling: Secondary | ICD-10-CM | POA: Diagnosis not present

## 2018-04-17 DIAGNOSIS — M6281 Muscle weakness (generalized): Secondary | ICD-10-CM | POA: Diagnosis not present

## 2018-04-17 DIAGNOSIS — Z7389 Other problems related to life management difficulty: Secondary | ICD-10-CM | POA: Diagnosis not present

## 2018-04-17 DIAGNOSIS — S42302D Unspecified fracture of shaft of humerus, left arm, subsequent encounter for fracture with routine healing: Secondary | ICD-10-CM | POA: Diagnosis not present

## 2018-04-17 DIAGNOSIS — M79622 Pain in left upper arm: Secondary | ICD-10-CM | POA: Diagnosis not present

## 2018-05-01 ENCOUNTER — Encounter: Payer: Self-pay | Admitting: Nurse Practitioner

## 2018-05-01 ENCOUNTER — Non-Acute Institutional Stay (SKILLED_NURSING_FACILITY): Payer: Medicare Other | Admitting: Nurse Practitioner

## 2018-05-01 DIAGNOSIS — R000 Tachycardia, unspecified: Secondary | ICD-10-CM | POA: Diagnosis not present

## 2018-05-01 DIAGNOSIS — D509 Iron deficiency anemia, unspecified: Secondary | ICD-10-CM | POA: Diagnosis not present

## 2018-05-01 DIAGNOSIS — J42 Unspecified chronic bronchitis: Secondary | ICD-10-CM | POA: Diagnosis not present

## 2018-05-01 DIAGNOSIS — K219 Gastro-esophageal reflux disease without esophagitis: Secondary | ICD-10-CM | POA: Diagnosis not present

## 2018-05-01 DIAGNOSIS — I10 Essential (primary) hypertension: Secondary | ICD-10-CM | POA: Diagnosis not present

## 2018-05-01 DIAGNOSIS — F0150 Vascular dementia without behavioral disturbance: Secondary | ICD-10-CM | POA: Diagnosis not present

## 2018-05-01 DIAGNOSIS — F418 Other specified anxiety disorders: Secondary | ICD-10-CM

## 2018-05-01 NOTE — Assessment & Plan Note (Signed)
Stable, continue Omeprazole 10mg qd.  

## 2018-05-01 NOTE — Progress Notes (Signed)
Location:  [place] Room Number: 69 Place of Service:  SNF (31) Provider:  Marlana Latus  NP  Kervin Bones X, NP  Patient Care Team: Kenora Spayd X, NP as PCP - General (Internal Medicine) [name], MD as Referring Physician (Ophthalmology)  Extended Emergency Contact Information Primary Emergency Contact: Slight,James Address: [address]          [place], [place] [address] [name] of [place] Phone: [phone] Mobile Phone: [phone] Relation: Son Secondary Emergency Contact: [name] Address: [address]          [place], [place] [address] [name] of [place] Phone: [phone] Mobile Phone: [phone] Relation: Son  Code Status:  DNR Goals of care: Advanced Directive information Advanced Directives 05/01/2018  Does Patient Have a Medical Advance Directive? Yes  Type of Advance Directive Out of facility DNR (pink MOST or yellow form)  Does patient want to make changes to medical advance directive? No - Patient declined  Copy of [place] in Chart? -  Would patient like information on creating a medical advance directive? -  Pre-existing out of facility DNR order (yellow form or pink MOST form) Yellow form placed in chart (order not valid for inpatient use);Pink MOST form placed in chart (order not valid for inpatient use)     Chief Complaint  Patient presents with  . Medical Management of Chronic Issues    F/u- HTN, tachycardi, dementia, depression, anemia    HPI:  Pt is a 84 y.o. female seen today for medical management of chronic diseases.     The patient has history of depression, her mood is stable on Effexor 150mg  qd, Lorazepam 0.25mg  qhs. She resides in [facility] for safety and care assistance, on Memantine 10mg  bid for memory. GERD, stable on Omeprazole 10mg  qd. Tachycardia, heart rate is in control, on Metoprolol 50mg  qd 25mg  qd. Chronic bronchitis.  stable, on Breo Ellipta  daily, prn DuoNeb, Claritin prn, Albuterol Neb prn. IDA, on Iron, last Hgb 12.6 03/18/18.  Past Medical History:  Diagnosis Date  . Anemia, iron deficiency    takes Ferrous Sulfate daily  . Depression    takes Effexor daily  . Gait disorder 04/21/2014  . GERD (gastroesophageal reflux disease)    takes Omeprazole daily  . Herpes ocular    history of-takes Acyclovir daily  . High cholesterol    takes Atorvastatin daily  . History of hiatal hernia   . Hypertension    takes Metoprolol daily  . Insomnia    takes Xanax nightly  . Osteoarthritis of knee    bilateral  . Sciatic pain    Past Surgical History:  Procedure Laterality Date  . ABDOMINAL HYSTERECTOMY  1995   partial  . CARPAL TUNNEL RELEASE Right 07/21/2007  . CARPAL TUNNEL RELEASE Left 09/24/2007  . COLONOSCOPY    . DESCEMETS STRIPPING AUTOMATED ENDOTHELIAL KERATOPLASTY Left 03/14/2011  . DESCEMETS STRIPPING AUTOMATED ENDOTHELIAL KERATOPLASTY Right 08/20/2012  . EYE SURGERY Bilateral    cataract surgery  . EYE SURGERY Left    corneal transplant  . HERNIA REPAIR    . HIP ARTHROPLASTY Left 06/13/2013   Procedure: ARTHROPLASTY BIPOLAR HIP; Injection left shoulder;  Surgeon: Johnny Bridge, MD;  Location: [facility];  Service: Orthopedics;  Laterality: Left;  . JOINT REPLACEMENT     bilateral knees, left knee  . KNEE ARTHROSCOPY Right 05/11/2001  . KYPHOPLASTY N/A 04/10/2015   Procedure: T11 Kyphoplasty;  Surgeon: Jovita Gamma, MD;  Location: [facility];  Service: Neurosurgery;  Laterality: N/A;  T11 Kyphoplasty  . KYPHOPLASTY N/A 05/06/2015   Procedure: KYPHOPLASTY Thoracic twelve;  Surgeon: Jovita Gamma, MD;  Location: [facility];  Service: Neurosurgery;  Laterality: N/A;  T12 Kyphoplasty  . LAPAROSCOPIC NISSEN FUNDOPLICATION  5/85/2778  . LUMBAR LAMINECTOMY/DECOMPRESSION MICRODISCECTOMY  08/29/2010   L2-S1  . SHOULDER ARTHROSCOPY WITH ROTATOR CUFF REPAIR AND SUBACROMIAL DECOMPRESSION Right 01/01/2000  . TONSILLECTOMY      as child  . TOTAL HIP REVISION Left 07/08/2014   Procedure: TOTAL HIP REVISION;  Surgeon: Kerin Salen, MD;  Location: [facility];  Service: Orthopedics;  Laterality: Left;  . TOTAL KNEE ARTHROPLASTY Left 05/14/2004  . TOTAL KNEE ARTHROPLASTY  12/23/2011   Procedure: TOTAL KNEE ARTHROPLASTY;  Surgeon: Lorn Junes, MD;  Location: [facility];  Service: Orthopedics;  Laterality: Right;  DR [place] THIS CASE  . TRIGGER FINGER RELEASE Right 07/21/2007   thumb  . TRIGGER FINGER RELEASE Left 09/24/2007   middle finger  . TRIGGER FINGER RELEASE Right 03/10/2008   ring and little fingers  . TRIGGER FINGER RELEASE Right 09/02/2012   Procedure: RELEASE TRIGGER FINGER/A-1 PULLEY RIGHT INDEX FINGER;  Surgeon: Wynonia Sours, MD;  Location: [facility];  Service: Orthopedics;  Laterality: Right;  . TRIGGER FINGER RELEASE Left 12/22/2012   Procedure: RELEASE TRIGGER FINGER/A-1 PULLEY LEFT RING FINGER;  Surgeon: Wynonia Sours, MD;  Location: [facility];  Service: Orthopedics;  Laterality: Left;  Left     Allergies  Allergen Reactions  . Morphine And Related Other (See Comments)    AGITATION, STRANGE DREAMS  . Scopolamine Other (See Comments)    MENTAL CHANGES  . Atorvastatin Other (See Comments)    unknown  . Morphine Other (See Comments)    Disoriented, mood changes  . Nsaids Other (See Comments)    unknown  . Pravachol [Pravastatin Sodium] Other (See Comments)    unknown  . Pravastatin Other (See Comments)    unknown  . Teriparatide Other (See Comments)    unknown    Outpatient Encounter Medications as of 05/01/2018  Medication Sig  . acetaminophen (TYLENOL) 500 MG tablet Take 500 mg by mouth every 6 (six) hours as needed for mild pain or moderate pain.  Marland Kitchen albuterol (PROVENTIL) (2.5 MG/3ML) 0.083% nebulizer solution Take 2.5 mg by nebulization every 4 (four) hours as needed for wheezing or shortness of breath.  . Artificial Tear Solution (GENTEAL TEARS)  0.1-0.2-0.3 % SOLN Apply 1 drop to eye at bedtime as needed.  Marland Kitchen aspirin EC 81 MG tablet Take 81 mg by mouth daily.  . Cholecalciferol (VITAMIN D) 2000 units CAPS Take 2,000 Units by mouth daily.  Marland Kitchen Dextran 70-Hypromellose (ARTIFICIAL TEARS) 0.1-0.3 % SOLN Apply 1 drop to eye 4 (four) times daily.  Marland Kitchen docusate sodium (COLACE) 100 MG capsule Take 1 capsule by mouth daily as needed for mild constipation.   . ferrous sulfate 325 (65 FE) MG tablet Take 325 mg by mouth daily.  . fluticasone furoate-vilanterol (BREO ELLIPTA) 100-25 MCG/INH AEPB Inhale 1 puff into the lungs daily.  Marland Kitchen ipratropium-albuterol (DUONEB) 0.5-2.5 (3) MG/3ML SOLN Take 3 mLs by nebulization every 6 (six) hours as needed.  . magnesium hydroxide (MILK OF MAGNESIA) 400 MG/5ML suspension Take 15 mLs by mouth at bedtime as needed for mild constipation.   . memantine (NAMENDA) 10 MG tablet Take 10 mg by mouth 2 (two) times daily.  . metoprolol tartrate (  LOPRESSOR) 25 MG tablet Take 25 mg by mouth at bedtime.   . metoprolol tartrate (LOPRESSOR) 50 MG tablet Take 50 mg by mouth every morning.  . nitroGLYCERIN (NITROSTAT) 0.4 MG SL tablet Place 0.4 mg under the tongue every 5 (five) minutes as needed for chest pain.  Marland Kitchen omeprazole (PRILOSEC) 10 MG capsule Take 10 mg by mouth daily.  Marland Kitchen venlafaxine XR (EFFEXOR-XR) 150 MG 24 hr capsule Take 150 mg by mouth at bedtime.   . feeding supplement (BOOST / RESOURCE BREEZE) LIQD Take 1 Container by mouth 2 (two) times daily. For weight loss   . loratadine (CLARITIN) 10 MG tablet Take 10 mg by mouth daily as needed for allergies.  Marland Kitchen LORazepam (ATIVAN) 0.5 MG tablet Take 0.25 mg by mouth at bedtime.    No facility-administered encounter medications on file as of 05/01/2018.    ROS was provided with assistance of staff Review of Systems  Constitutional: Negative for activity change, appetite change, chills, diaphoresis, fatigue, fever and unexpected weight change.  HENT: Positive for hearing loss.  Negative for congestion and voice change.   Respiratory: Positive for cough. Negative for shortness of breath and wheezing.        Chronic hacking cough.   Cardiovascular: Negative for chest pain, palpitations and leg swelling.  Gastrointestinal: Negative for abdominal distention, abdominal pain, constipation, diarrhea, nausea and vomiting.  Genitourinary: Positive for frequency. Negative for difficulty urinating, dysuria, hematuria and urgency.  Musculoskeletal: Positive for arthralgias, back pain and gait problem.  Skin: Negative for color change and pallor.  Neurological: Negative for dizziness, speech difficulty, weakness and headaches.       Dementia  Psychiatric/Behavioral: Negative for agitation, behavioral problems, hallucinations and sleep disturbance. The patient is not nervous/anxious.     Immunization History  Administered Date(s) Administered  . Influenza Whole 03/27/2018  . Influenza-Unspecified 04/14/2017  . Pneumococcal Polysaccharide-23 06/14/2013   Pertinent  Health Maintenance Due  Topic Date Due  . PNA vac Low Risk Adult (2 of 2 - PCV13) 06/14/2014  . INFLUENZA VACCINE  Completed  . DEXA SCAN  Completed   Fall Risk  09/19/2017  Falls in the past year? No   Functional Status Survey:    Vitals:   05/01/18 1352  BP: 128/77  Pulse: 71  Resp: 20  Temp: 98 F (36.7 C)  SpO2: 94%  Weight: 117 lb 12.8 oz (53.4 kg)  Height: 5\' 1"  (1.549 m)   Body mass index is 22.26 kg/m. Physical Exam  Constitutional: She appears well-developed and well-nourished.  HENT:  Head: Normocephalic and atraumatic.  Eyes: Pupils are equal, round, and reactive to light. EOM are normal.  Neck: Normal range of motion. Neck supple. No JVD present. No thyromegaly present.  Cardiovascular: Normal rate and regular rhythm.  No murmur heard. Pulmonary/Chest: She has no wheezes. She has no rales.  Decreased air entry to both lung fields.   Abdominal: Soft. She exhibits no distension.  There is no tenderness. There is no rebound and no guarding.  Musculoskeletal: Normal range of motion. She exhibits no edema.  SBA or limited assistance for transfer. W/c for mobility.   Neurological: She is alert. No cranial nerve deficit. She exhibits normal muscle tone.  Oriented to person and place.   Skin: Skin is warm and dry.  Psychiatric: She has a normal mood and affect. Her behavior is normal.    Labs reviewed: Recent Labs    07/30/17 1113 08/03/17 1851 08/08/17 1135  11/13/17 11/18/17 03/18/18  NA  130* 131* 130*   < > 131* 131*  131* 132*  K 4.3 4.4 4.5   < > 4.3 4.7  4.7 4.4  CL 98* 96* 96*   < > 98 99 97  CO2 21* 26 22   < > 24 26 28   GLUCOSE 94 96 91  --   --   --   --   BUN 14 17 11    < > 12 17  17 12   CREATININE 0.79 0.72 0.67   < > 0.6 0.6  0.6 0.6  CALCIUM 8.9 9.1 9.1   < > 9.0 8.8 9.1   < > = values in this interval not displayed.   Recent Labs    07/30/17 1113 08/03/17 1851  11/04/17 11/13/17 11/18/17  AST 24 23   < > 20 18 18  18   ALT 16 16   < > 15 13 13  13   ALKPHOS 148* 142*   < > 207* 158* 156*  156*  BILITOT 0.3 0.3  --   --   --   --   PROT 6.6 7.5  --   --   --   --   ALBUMIN 3.4* 4.0  --  4.2 4.0 3.5   < > = values in this interval not displayed.   Recent Labs    07/30/17 1113 08/03/17 1851 08/08/17 1135  10/30/17 11/13/17 03/18/18  WBC 8.4 6.8 8.0   < > 5.3 5.4 8.7  NEUTROABS 5.2 3.5 4.8  --   --   --   --   HGB 12.1 13.0 13.1   < > 11.6* 12.9 12.6  HCT 35.8* 38.8 39.5   < > 34* 38 37  MCV 96.5 97.5 97.8  --   --   --   --   PLT 325 375 318   < > 363 369 308   < > = values in this interval not displayed.   Lab Results  Component Value Date   TSH 2.99 11/13/2017   Lab Results  Component Value Date   HGBA1C 5.8 06/29/2013   No results found for: CHOL, HDL, LDLCALC, LDLDIRECT, TRIG, CHOLHDL  Significant Diagnostic Results in last 30 days:  No results found.  Assessment/Plan Hypertension Blood pressure is controlled,  continue Metoprolol 25mg  qd, 50mg  qd.   Chronic bronchitis (HCC) Chronic hacking cough, continue Breo Elipita qd, prn DuoNeb/Albuterol/Claritin  GERD (gastroesophageal reflux disease) Stable, continue Omeprazole 10mg  qd.   Vascular dementia Continue SNF [facility] for safety and care assistance, continue Mematnine 10mg  bid for memory.   Tachycardia Heart rate is in control, continue Metoprolol 25mg  qd, 50mg  qd.   Depression with anxiety Mood is stable, continue Effexor 150mg  qd, Lorazepam 0.25mg  qhs.   Iron deficiency anemia Improved, continue Fe, last Hgb 12.6 03/18/18     Family/ staff Communication: plan of care reviewed with the patient and charge nurse.   Labs/tests ordered: none  Time spend 25 minutes.

## 2018-05-01 NOTE — Assessment & Plan Note (Signed)
Heart rate is in control, continue Metoprolol 25mg  qd, 50mg  qd.

## 2018-05-01 NOTE — Assessment & Plan Note (Signed)
Continue SNF [facility] for safety and care assistance, continue Mematnine 10mg  bid for memory.

## 2018-05-01 NOTE — Assessment & Plan Note (Signed)
Chronic hacking cough, continue Breo Elipita qd, prn DuoNeb/Albuterol/Claritin

## 2018-05-01 NOTE — Assessment & Plan Note (Signed)
Improved, continue Fe, last Hgb 12.6 03/18/18

## 2018-05-01 NOTE — Assessment & Plan Note (Signed)
Mood is stable, continue Effexor 150mg  qd, Lorazepam 0.25mg  qhs.

## 2018-05-01 NOTE — Assessment & Plan Note (Signed)
Blood pressure is controlled, continue Metoprolol 25mg  qd, 50mg  qd.

## 2018-05-04 ENCOUNTER — Encounter: Payer: Self-pay | Admitting: Nurse Practitioner

## 2018-05-12 DIAGNOSIS — S42202D Unspecified fracture of upper end of left humerus, subsequent encounter for fracture with routine healing: Secondary | ICD-10-CM | POA: Diagnosis not present

## 2018-05-15 ENCOUNTER — Encounter: Payer: Self-pay | Admitting: Nurse Practitioner

## 2018-05-15 ENCOUNTER — Non-Acute Institutional Stay (SKILLED_NURSING_FACILITY): Payer: Medicare Other | Admitting: Nurse Practitioner

## 2018-05-15 DIAGNOSIS — L853 Xerosis cutis: Secondary | ICD-10-CM

## 2018-05-15 DIAGNOSIS — F0150 Vascular dementia without behavioral disturbance: Secondary | ICD-10-CM

## 2018-05-15 NOTE — Assessment & Plan Note (Signed)
Continue SNF [facility] for safety and care assistance. Continue Memantine 10mg  bid for memory.

## 2018-05-15 NOTE — Assessment & Plan Note (Signed)
1% Hydrocortisone cream to affected area bid x 2 weeks, observe.

## 2018-05-15 NOTE — Progress Notes (Signed)
Location:  [facility] Room Number: 42-B Place of Service:  SNF (31) Provider:  Marlana Latus  NP  Mast, Man X, NP  Patient Care Team: Mast, Man X, NP as PCP - General (Internal Medicine) [name], MD as Referring Physician (Ophthalmology)  Extended Emergency Contact Information Primary Emergency Contact: Halderman,James Address: [address]          [place], [place] [address] [name] of [place] Phone: [phone] Mobile Phone: [phone] Relation: Son Secondary Emergency Contact: [name] Address: [address]          [place], [place] [address] [name] of [place] Phone: [phone] Mobile Phone: [phone] Relation: Son  Code Status:  DNR Goals of care: Advanced Directive information Advanced Directives 05/01/2018  Does Patient Have a Medical Advance Directive? Yes  Type of Advance Directive Out of facility DNR (pink MOST or yellow form)  Does patient want to make changes to medical advance directive? No - Patient declined  Copy of [place] in Chart? -  Would patient like information on creating a medical advance directive? -  Pre-existing out of facility DNR order (yellow form or pink MOST form) Yellow form placed in chart (order not valid for inpatient use);Pink MOST form placed in chart (order not valid for inpatient use)     Chief Complaint  Patient presents with  . Acute Visit    Red maks on (R) arm    HPI:  Pt is a 84 y.o. female seen today for an acute visit for defused size of a head of pin spots, appears scabs off from friction, no peri wounds erythema, no s/s of infection. She denied pain or itching. Onset and duration are uncertain. She admitted her hx of dry and itching skin. HPI was provide with assistance of staff. She resides in [facility] for safety and care assistance. On Memantine 10mg  bid for memory.    Past Medical History:  Diagnosis Date  . Anemia, iron  deficiency    takes Ferrous Sulfate daily  . Depression    takes Effexor daily  . Gait disorder 04/21/2014  . GERD (gastroesophageal reflux disease)    takes Omeprazole daily  . Herpes ocular    history of-takes Acyclovir daily  . High cholesterol    takes Atorvastatin daily  . History of hiatal hernia   . Hypertension    takes Metoprolol daily  . Insomnia    takes Xanax nightly  . Osteoarthritis of knee    bilateral  . Sciatic pain    Past Surgical History:  Procedure Laterality Date  . ABDOMINAL HYSTERECTOMY  1995   partial  . CARPAL TUNNEL RELEASE Right 07/21/2007  . CARPAL TUNNEL RELEASE Left 09/24/2007  . COLONOSCOPY    . DESCEMETS STRIPPING AUTOMATED ENDOTHELIAL KERATOPLASTY Left 03/14/2011  . DESCEMETS STRIPPING AUTOMATED ENDOTHELIAL KERATOPLASTY Right 08/20/2012  . EYE SURGERY Bilateral    cataract surgery  . EYE SURGERY Left    corneal transplant  . HERNIA REPAIR    . HIP ARTHROPLASTY Left 06/13/2013   Procedure: ARTHROPLASTY BIPOLAR HIP; Injection left shoulder;  Surgeon: Johnny Bridge, MD;  Location: [facility];  Service: Orthopedics;  Laterality: Left;  . JOINT REPLACEMENT     bilateral knees, left knee  . KNEE ARTHROSCOPY Right 05/11/2001  . KYPHOPLASTY N/A 04/10/2015   Procedure: T11 Kyphoplasty;  Surgeon: Jovita Gamma, MD;  Location: [facility];  Service: Neurosurgery;  Laterality: N/A;  T11 Kyphoplasty  . KYPHOPLASTY  N/A 05/06/2015   Procedure: KYPHOPLASTY Thoracic twelve;  Surgeon: Jovita Gamma, MD;  Location: [facility];  Service: Neurosurgery;  Laterality: N/A;  T12 Kyphoplasty  . LAPAROSCOPIC NISSEN FUNDOPLICATION  1/61/0960  . LUMBAR LAMINECTOMY/DECOMPRESSION MICRODISCECTOMY  08/29/2010   L2-S1  . SHOULDER ARTHROSCOPY WITH ROTATOR CUFF REPAIR AND SUBACROMIAL DECOMPRESSION Right 01/01/2000  . TONSILLECTOMY     as child  . TOTAL HIP REVISION Left 07/08/2014   Procedure: TOTAL HIP REVISION;  Surgeon: Kerin Salen, MD;  Location: [facility];  Service:  Orthopedics;  Laterality: Left;  . TOTAL KNEE ARTHROPLASTY Left 05/14/2004  . TOTAL KNEE ARTHROPLASTY  12/23/2011   Procedure: TOTAL KNEE ARTHROPLASTY;  Surgeon: Lorn Junes, MD;  Location: [facility];  Service: Orthopedics;  Laterality: Right;  DR [place] THIS CASE  . TRIGGER FINGER RELEASE Right 07/21/2007   thumb  . TRIGGER FINGER RELEASE Left 09/24/2007   middle finger  . TRIGGER FINGER RELEASE Right 03/10/2008   ring and little fingers  . TRIGGER FINGER RELEASE Right 09/02/2012   Procedure: RELEASE TRIGGER FINGER/A-1 PULLEY RIGHT INDEX FINGER;  Surgeon: Wynonia Sours, MD;  Location: [facility];  Service: Orthopedics;  Laterality: Right;  . TRIGGER FINGER RELEASE Left 12/22/2012   Procedure: RELEASE TRIGGER FINGER/A-1 PULLEY LEFT RING FINGER;  Surgeon: Wynonia Sours, MD;  Location: [facility];  Service: Orthopedics;  Laterality: Left;  Left     Allergies  Allergen Reactions  . Morphine And Related Other (See Comments)    AGITATION, STRANGE DREAMS  . Scopolamine Other (See Comments)    MENTAL CHANGES  . Atorvastatin Other (See Comments)    unknown  . Morphine Other (See Comments)    Disoriented, mood changes  . Nsaids Other (See Comments)    unknown  . Pravachol [Pravastatin Sodium] Other (See Comments)    unknown  . Pravastatin Other (See Comments)    unknown  . Teriparatide Other (See Comments)    unknown    Outpatient Encounter Medications as of 05/15/2018  Medication Sig  . acetaminophen (TYLENOL) 500 MG tablet Take 500 mg by mouth every 6 (six) hours as needed for mild pain or moderate pain.  Marland Kitchen albuterol (PROVENTIL) (2.5 MG/3ML) 0.083% nebulizer solution Take 2.5 mg by nebulization every 4 (four) hours as needed for wheezing or shortness of breath.  . Artificial Tear Solution (GENTEAL TEARS) 0.1-0.2-0.3 % SOLN Apply 1 drop to eye at bedtime as needed.  Marland Kitchen aspirin EC 81 MG tablet Take 81 mg by mouth. To be given every 5 days.  .  Cholecalciferol (VITAMIN D) 2000 units CAPS Take 2,000 Units by mouth daily.  Marland Kitchen Dextran 70-Hypromellose (ARTIFICIAL TEARS) 0.1-0.3 % SOLN Apply 1 drop to eye 4 (four) times daily.  Marland Kitchen docusate sodium (COLACE) 100 MG capsule Take 1 capsule by mouth daily as needed for mild constipation.   . feeding supplement (BOOST / RESOURCE BREEZE) LIQD Take 1 Container by mouth 2 (two) times daily. For weight loss   . ferrous sulfate 325 (65 FE) MG tablet Take 325 mg by mouth daily.  . fluticasone furoate-vilanterol (BREO ELLIPTA) 100-25 MCG/INH AEPB Inhale 1 puff into the lungs daily.  Marland Kitchen ipratropium-albuterol (DUONEB) 0.5-2.5 (3) MG/3ML SOLN Take 3 mLs by nebulization every 6 (six) hours as needed.  . loratadine (CLARITIN) 10 MG tablet Take 10 mg by mouth daily as needed for allergies.  Marland Kitchen LORazepam (ATIVAN) 0.5 MG tablet Take 0.25 mg by mouth at bedtime.   Marland Kitchen  magnesium hydroxide (MILK OF MAGNESIA) 400 MG/5ML suspension Take 15 mLs by mouth at bedtime as needed for mild constipation.   . memantine (NAMENDA) 10 MG tablet Take 10 mg by mouth 2 (two) times daily.  . metoprolol tartrate (LOPRESSOR) 25 MG tablet Take 25 mg by mouth at bedtime.   . metoprolol tartrate (LOPRESSOR) 50 MG tablet Take 50 mg by mouth every morning.  . nitroGLYCERIN (NITROSTAT) 0.4 MG SL tablet Place 0.4 mg under the tongue every 5 (five) minutes as needed for chest pain.  Marland Kitchen omeprazole (PRILOSEC) 10 MG capsule Take 10 mg by mouth daily.  Marland Kitchen venlafaxine XR (EFFEXOR-XR) 150 MG 24 hr capsule Take 150 mg by mouth at bedtime.   . [DISCONTINUED] aspirin EC 81 MG tablet Take 81 mg by mouth. Give ASA once every 5 days.   No facility-administered encounter medications on file as of 05/15/2018.    ROS was provided with assistance of taff Review of Systems  Constitutional: Negative for activity change, appetite change, chills, diaphoresis, fatigue and fever.  HENT: Positive for hearing loss. Negative for congestion and voice change.   Respiratory:  Positive for cough. Negative for shortness of breath and wheezing.        Chronic hacking cough.   Cardiovascular: Negative for chest pain, palpitations and leg swelling.  Gastrointestinal: Negative for abdominal distention, abdominal pain, constipation, diarrhea, nausea and vomiting.  Genitourinary: Negative for difficulty urinating, dysuria and urgency.  Musculoskeletal: Positive for arthralgias and gait problem.  Skin: Positive for rash. Negative for color change and pallor.  Neurological: Negative for dizziness, speech difficulty and headaches.       Dementia.   Psychiatric/Behavioral: Positive for confusion. Negative for agitation, behavioral problems, hallucinations and sleep disturbance. The patient is not nervous/anxious.     Immunization History  Administered Date(s) Administered  . Influenza Whole 03/27/2018  . Influenza-Unspecified 04/14/2017  . Pneumococcal Polysaccharide-23 06/14/2013   Pertinent  Health Maintenance Due  Topic Date Due  . PNA vac Low Risk Adult (2 of 2 - PCV13) 06/14/2014  . INFLUENZA VACCINE  Completed  . DEXA SCAN  Completed   Fall Risk  09/19/2017  Falls in the past year? No   Functional Status Survey:    Vitals:   05/15/18 1055  BP: 140/80  Pulse: 78  Resp: 20  Temp: 98.6 F (37 C)  SpO2: 96%  Weight: 117 lb 12.8 oz (53.4 kg)  Height: 5\' 1"  (1.549 m)   Body mass index is 22.26 kg/m. Physical Exam  Constitutional: She appears well-developed and well-nourished.  HENT:  Head: Normocephalic and atraumatic.  Eyes: Pupils are equal, round, and reactive to light. EOM are normal.  Neck: Normal range of motion. Neck supple. No JVD present. No thyromegaly present.  Cardiovascular: Normal rate and regular rhythm.  Pulmonary/Chest: Effort normal. She has no wheezes. She has no rales.  Decreased air entry to both lungs.   Abdominal: Soft. She exhibits no distension. There is no tenderness. There is no rebound and no guarding.  Musculoskeletal:    W/c for mobility   Neurological: She is alert. No cranial nerve deficit. She exhibits normal muscle tone. Coordination normal.  Oriented to person and place.   Skin: Skin is warm and dry. Rash noted.  Diffused size about a head of pin appears scabbed scratched off spots on the right shoulder and lateral upper arm. No active bleed, itching, pain, or s/s of infection. Overall skin appears dry.   Psychiatric: She has a normal mood and  affect. Her behavior is normal.    Labs reviewed: Recent Labs    07/30/17 1113 08/03/17 1851 08/08/17 1135  11/13/17 11/18/17 03/18/18  NA 130* 131* 130*   < > 131* 131*  131* 132*  K 4.3 4.4 4.5   < > 4.3 4.7  4.7 4.4  CL 98* 96* 96*   < > 98 99 97  CO2 21* 26 22   < > 24 26 28   GLUCOSE 94 96 91  --   --   --   --   BUN 14 17 11    < > 12 17  17 12   CREATININE 0.79 0.72 0.67   < > 0.6 0.6  0.6 0.6  CALCIUM 8.9 9.1 9.1   < > 9.0 8.8 9.1   < > = values in this interval not displayed.   Recent Labs    07/30/17 1113 08/03/17 1851  11/04/17 11/13/17 11/18/17  AST 24 23   < > 20 18 18  18   ALT 16 16   < > 15 13 13  13   ALKPHOS 148* 142*   < > 207* 158* 156*  156*  BILITOT 0.3 0.3  --   --   --   --   PROT 6.6 7.5  --   --   --   --   ALBUMIN 3.4* 4.0  --  4.2 4.0 3.5   < > = values in this interval not displayed.   Recent Labs    07/30/17 1113 08/03/17 1851 08/08/17 1135  10/30/17 11/13/17 03/18/18  WBC 8.4 6.8 8.0   < > 5.3 5.4 8.7  NEUTROABS 5.2 3.5 4.8  --   --   --   --   HGB 12.1 13.0 13.1   < > 11.6* 12.9 12.6  HCT 35.8* 38.8 39.5   < > 34* 38 37  MCV 96.5 97.5 97.8  --   --   --   --   PLT 325 375 318   < > 363 369 308   < > = values in this interval not displayed.   Lab Results  Component Value Date   TSH 2.99 11/13/2017   Lab Results  Component Value Date   HGBA1C 5.8 06/29/2013   No results found for: CHOL, HDL, LDLCALC, LDLDIRECT, TRIG, CHOLHDL  Significant Diagnostic Results in last 30 days:  No results  found.  Assessment/Plan Dry skin dermatitis 1% Hydrocortisone cream to affected area bid x 2 weeks, observe.   Vascular dementia Continue SNF [facility] for safety and care assistance. Continue Memantine 10mg  bid for memory.      Family/ staff Communication: plan of care reviewed with the patient and charge nurse.   Labs/tests ordered:  none  Time spend 25 minutes.

## 2018-05-19 ENCOUNTER — Encounter: Payer: Self-pay | Admitting: Nurse Practitioner

## 2018-06-08 ENCOUNTER — Encounter: Payer: Self-pay | Admitting: Family Medicine

## 2018-06-08 ENCOUNTER — Non-Acute Institutional Stay (SKILLED_NURSING_FACILITY): Payer: Medicare Other | Admitting: Family Medicine

## 2018-06-08 DIAGNOSIS — I10 Essential (primary) hypertension: Secondary | ICD-10-CM

## 2018-06-08 DIAGNOSIS — R269 Unspecified abnormalities of gait and mobility: Secondary | ICD-10-CM | POA: Diagnosis not present

## 2018-06-08 DIAGNOSIS — J42 Unspecified chronic bronchitis: Secondary | ICD-10-CM | POA: Diagnosis not present

## 2018-06-08 NOTE — Progress Notes (Signed)
Provider:  Alain Honey, MD Location:  [facility] Room Number: 42-B Place of Service:  SNF (31)  PCP: Mast, Man X, NP Patient Care Team: Mast, Man X, NP as PCP - General (Internal Medicine) [name], MD as Referring Physician (Ophthalmology)  Extended Emergency Contact Information Primary Emergency Contact: [name] Address: [address]          [place], [place] [address] [name] of [place] Phone: [phone] Mobile Phone: [phone] Relation: Son Secondary Emergency Contact: [name] Address: [address]          [place], [place] [address] [name] of [employer] Phone: [phone] Mobile Phone: [phone] Relation: Son  Code Status: DNR Goals of Care: Advanced Directive information Advanced Directives 06/08/2018  Does Patient Have a Medical Advance Directive? Yes  Type of Advance Directive Out of facility DNR (pink MOST or yellow form)  Does patient want to make changes to medical advance directive? No - Patient declined  Copy of [place] in Chart? -  Would patient like information on creating a medical advance directive? -  Pre-existing out of facility DNR order (yellow form or pink MOST form) Yellow form placed in chart (order not valid for inpatient use);Pink MOST form placed in chart (order not valid for inpatient use)      Chief Complaint  Patient presents with  . Medical Management of Chronic Issues    HPI: Patient is a 84 y.o. female seen today for medical management of chronic problems including: Vascular dementia, iron deficiency anemia, primary hypertension, and gait disturbance.  I found this delightful lady in the dining room as she was waiting her lunch to come.  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  She is a 69 years old and younger than most of her contemporaries  and around the table here in the dining room and able to converse.  She denies any current problems but looking over her problems and medicine list she does have a history of chronic bronchitis some unspecified tachycardia, anemia, and depression.  She denies any specific complaints or issues today.  She did mention the anemia and ask about her most recent hemoglobin which was 12.12 months ago.  She continues on iron therapy.  Past Medical History:  Diagnosis Date  . Anemia, iron deficiency    takes Ferrous Sulfate daily  . Depression    takes Effexor daily  . Gait disorder 04/21/2014  . GERD (gastroesophageal reflux disease)    takes Omeprazole daily  . Herpes ocular    history of-takes Acyclovir daily  . High cholesterol    takes Atorvastatin daily  . History of hiatal hernia   . Hypertension    takes Metoprolol daily  . Insomnia    takes Xanax nightly  . Osteoarthritis of knee    bilateral  . Sciatic pain    Past Surgical History:  Procedure Laterality Date  . ABDOMINAL HYSTERECTOMY  1995   partial  . CARPAL TUNNEL RELEASE Right 07/21/2007  . CARPAL TUNNEL RELEASE Left 09/24/2007  . COLONOSCOPY    . DESCEMETS STRIPPING AUTOMATED ENDOTHELIAL KERATOPLASTY Left 03/14/2011  . DESCEMETS STRIPPING AUTOMATED ENDOTHELIAL KERATOPLASTY Right 08/20/2012  . EYE SURGERY Bilateral    cataract surgery  . EYE SURGERY Left    corneal transplant  . HERNIA REPAIR    . HIP ARTHROPLASTY Left 06/13/2013  Procedure: ARTHROPLASTY BIPOLAR HIP; Injection left shoulder;  Surgeon: Johnny Bridge, MD;  Location: [facility];  Service: Orthopedics;  Laterality: Left;  . JOINT REPLACEMENT     bilateral knees, left knee  . KNEE ARTHROSCOPY Right 05/11/2001  . KYPHOPLASTY N/A 04/10/2015   Procedure: T11 Kyphoplasty;  Surgeon: Jovita Gamma, MD;  Location: [facility];  Service: Neurosurgery;  Laterality: N/A;  T11 Kyphoplasty  . KYPHOPLASTY N/A 05/06/2015   Procedure: KYPHOPLASTY Thoracic twelve;  Surgeon:  Jovita Gamma, MD;  Location: [facility];  Service: Neurosurgery;  Laterality: N/A;  T12 Kyphoplasty  . LAPAROSCOPIC NISSEN FUNDOPLICATION  07/28/5595  . LUMBAR LAMINECTOMY/DECOMPRESSION MICRODISCECTOMY  08/29/2010   L2-S1  . SHOULDER ARTHROSCOPY WITH ROTATOR CUFF REPAIR AND SUBACROMIAL DECOMPRESSION Right 01/01/2000  . TONSILLECTOMY     as child  . TOTAL HIP REVISION Left 07/08/2014   Procedure: TOTAL HIP REVISION;  Surgeon: Kerin Salen, MD;  Location: [facility];  Service: Orthopedics;  Laterality: Left;  . TOTAL KNEE ARTHROPLASTY Left 05/14/2004  . TOTAL KNEE ARTHROPLASTY  12/23/2011   Procedure: TOTAL KNEE ARTHROPLASTY;  Surgeon: Lorn Junes, MD;  Location: [facility];  Service: Orthopedics;  Laterality: Right;  DR [place] THIS CASE  . TRIGGER FINGER RELEASE Right 07/21/2007   thumb  . TRIGGER FINGER RELEASE Left 09/24/2007   middle finger  . TRIGGER FINGER RELEASE Right 03/10/2008   ring and little fingers  . TRIGGER FINGER RELEASE Right 09/02/2012   Procedure: RELEASE TRIGGER FINGER/A-1 PULLEY RIGHT INDEX FINGER;  Surgeon: Wynonia Sours, MD;  Location: [facility];  Service: Orthopedics;  Laterality: Right;  . TRIGGER FINGER RELEASE Left 12/22/2012   Procedure: RELEASE TRIGGER FINGER/A-1 PULLEY LEFT RING FINGER;  Surgeon: Wynonia Sours, MD;  Location: [facility];  Service: Orthopedics;  Laterality: Left;  Left     reports that she has quit smoking. She has never used smokeless tobacco. She reports current drug use. She reports that she does not drink alcohol. Social History   Socioeconomic History  . Marital status: Married    Spouse name: Not on file  . Number of children: Not on file  . Years of education: Not on file  . Highest education level: Not on file  Occupational History  . Not on file  Social Needs  . Financial resource strain: Not hard at all  . Food insecurity:    Worry: Never true    Inability: Never true  . Transportation  needs:    Medical: No    Non-medical: No  Tobacco Use  . Smoking status: Former Research scientist (life sciences)  . Smokeless tobacco: Never Used  . Tobacco comment: quit smoking 20-30 yr. ago  Substance and Sexual Activity  . Alcohol use: No  . Drug use: Yes  . Sexual activity: Not Currently  Lifestyle  . Physical activity:    Days per week: 0 days    Minutes per session: 0 min  . Stress: Not at all  Relationships  . Social connections:    Talks on phone: Twice a week    Gets together: Once a week    Attends religious service: Never    Active member of club or organization: No    Attends meetings of clubs or organizations: Never    Relationship status: Married  . Intimate partner violence:    Fear of current or ex partner: No    Emotionally abused: No    Physically abused: No  Forced sexual activity: No  Other Topics Concern  . Not on file  Social History Narrative  . Not on file    Functional Status Survey:    Family History  Problem Relation Age of Onset  . Heart attack Mother   . Parkinsonism Father   . Diabetes Brother     Health Maintenance  Topic Date Due  . Samul Dada  12/24/1948  . PNA vac Low Risk Adult (2 of 2 - PCV13) 06/14/2014  . INFLUENZA VACCINE  Completed  . DEXA SCAN  Completed    Allergies  Allergen Reactions  . Morphine And Related Other (See Comments)    AGITATION, STRANGE DREAMS  . Scopolamine Other (See Comments)    MENTAL CHANGES  . Atorvastatin Other (See Comments)    unknown  . Morphine Other (See Comments)    Disoriented, mood changes  . Nsaids Other (See Comments)    unknown  . Pravachol [Pravastatin Sodium] Other (See Comments)    unknown  . Pravastatin Other (See Comments)    unknown  . Teriparatide Other (See Comments)    unknown    Outpatient Encounter Medications as of 06/08/2018  Medication Sig  . acetaminophen (TYLENOL) 500 MG tablet Take 500 mg by mouth every 6 (six) hours as needed for mild pain or moderate pain.  Marland Kitchen albuterol  (PROVENTIL) (2.5 MG/3ML) 0.083% nebulizer solution Take 2.5 mg by nebulization every 4 (four) hours as needed for wheezing or shortness of breath.  . Artificial Tear Solution (GENTEAL TEARS) 0.1-0.2-0.3 % SOLN Apply 1 drop to eye at bedtime as needed.  Marland Kitchen aspirin EC 81 MG tablet Take 81 mg by mouth. To be given every 5 days.  . Cholecalciferol (VITAMIN D) 2000 units CAPS Take 2,000 Units by mouth daily.  Marland Kitchen Dextran 70-Hypromellose (ARTIFICIAL TEARS) 0.1-0.3 % SOLN Apply 1 drop to eye 4 (four) times daily.  Marland Kitchen docusate sodium (COLACE) 100 MG capsule Take 1 capsule by mouth daily as needed for mild constipation.   . feeding supplement (BOOST / RESOURCE BREEZE) LIQD Take 1 Container by mouth 2 (two) times daily. For weight loss   . ferrous sulfate 325 (65 FE) MG tablet Take 325 mg by mouth daily.  . fluticasone furoate-vilanterol (BREO ELLIPTA) 100-25 MCG/INH AEPB Inhale 1 puff into the lungs daily.  Marland Kitchen ipratropium-albuterol (DUONEB) 0.5-2.5 (3) MG/3ML SOLN Take 3 mLs by nebulization every 6 (six) hours as needed.  . loratadine (CLARITIN) 10 MG tablet Take 10 mg by mouth daily as needed for allergies.  Marland Kitchen LORazepam (ATIVAN) 0.5 MG tablet Take 0.25 mg by mouth at bedtime.   . magnesium hydroxide (MILK OF MAGNESIA) 400 MG/5ML suspension Take 15 mLs by mouth at bedtime as needed for mild constipation.   . memantine (NAMENDA) 10 MG tablet Take 10 mg by mouth 2 (two) times daily.  . metoprolol tartrate (LOPRESSOR) 25 MG tablet Take 25 mg by mouth at bedtime.   . metoprolol tartrate (LOPRESSOR) 50 MG tablet Take 50 mg by mouth every morning.  . nitroGLYCERIN (NITROSTAT) 0.4 MG SL tablet Place 0.4 mg under the tongue every 5 (five) minutes as needed for chest pain.  Marland Kitchen omeprazole (PRILOSEC) 10 MG capsule Take 10 mg by mouth daily.  Marland Kitchen venlafaxine XR (EFFEXOR-XR) 150 MG 24 hr capsule Take 150 mg by mouth at bedtime.    No facility-administered encounter medications on file as of 06/08/2018.     Review of  Systems  Constitutional: Negative.   HENT: Negative.   Respiratory: Positive  for cough.   Cardiovascular: Negative.   Genitourinary: Negative.   Musculoskeletal: Positive for gait problem.  Psychiatric/Behavioral: Positive for confusion.    Vitals:   06/08/18 1336  BP: 124/62  Pulse: 72  Resp: 20  Temp: 98.2 F (36.8 C)  SpO2: 96%  Weight: 118 lb (53.5 kg)  Height: 5\' 1"  (1.549 m)   Body mass index is 22.3 kg/m. Physical Exam Vitals signs and nursing note reviewed. Exam conducted with a chaperone present.  Constitutional:      Appearance: Normal appearance.  HENT:     Head: Normocephalic.     Nose: Nose normal.  Cardiovascular:     Rate and Rhythm: Normal rate and regular rhythm.     Heart sounds: Normal heart sounds.  Pulmonary:     Effort: Pulmonary effort is normal.     Breath sounds: Normal breath sounds.  Abdominal:     General: Bowel sounds are normal.     Palpations: Abdomen is soft.  Musculoskeletal: Normal range of motion.     Comments: Ambulates via wheelchair  Neurological:     General: No focal deficit present.     Mental Status: She is alert.     Comments: Oriented to person and place  Psychiatric:        Mood and Affect: Mood normal.        Behavior: Behavior normal.     Labs reviewed: Basic Metabolic Panel: Recent Labs    07/30/17 1113 08/03/17 1851 08/08/17 1135  11/13/17 11/18/17 03/18/18  NA 130* 131* 130*   < > 131* 131*  131* 132*  K 4.3 4.4 4.5   < > 4.3 4.7  4.7 4.4  CL 98* 96* 96*   < > 98 99 97  CO2 21* 26 22   < > 24 26 28   GLUCOSE 94 96 91  --   --   --   --   BUN 14 17 11    < > 12 17  17 12   CREATININE 0.79 0.72 0.67   < > 0.6 0.6  0.6 0.6  CALCIUM 8.9 9.1 9.1   < > 9.0 8.8 9.1   < > = values in this interval not displayed.   Liver Function Tests: Recent Labs    07/30/17 1113 08/03/17 1851  11/04/17 11/13/17 11/18/17  AST 24 23   < > 20 18 18  18   ALT 16 16   < > 15 13 13  13   ALKPHOS 148* 142*   < > 207* 158*  156*  156*  BILITOT 0.3 0.3  --   --   --   --   PROT 6.6 7.5  --   --   --   --   ALBUMIN 3.4* 4.0  --  4.2 4.0 3.5   < > = values in this interval not displayed.   No results for input(s): LIPASE, AMYLASE in the last 8760 hours. No results for input(s): AMMONIA in the last 8760 hours. CBC: Recent Labs    07/30/17 1113 08/03/17 1851 08/08/17 1135  10/30/17 11/13/17 03/18/18  WBC 8.4 6.8 8.0   < > 5.3 5.4 8.7  NEUTROABS 5.2 3.5 4.8  --   --   --   --   HGB 12.1 13.0 13.1   < > 11.6* 12.9 12.6  HCT 35.8* 38.8 39.5   < > 34* 38 37  MCV 96.5 97.5 97.8  --   --   --   --  PLT 325 375 318   < > 363 369 308   < > = values in this interval not displayed.   Cardiac Enzymes: No results for input(s): CKTOTAL, CKMB, CKMBINDEX, TROPONINI in the last 8760 hours. BNP: Invalid input(s): POCBNP Lab Results  Component Value Date   HGBA1C 5.8 06/29/2013   Lab Results  Component Value Date   TSH 2.99 11/13/2017   Lab Results  Component Value Date   VITAMINB12 408 09/18/2017   Lab Results  Component Value Date   FOLATE 6.9 09/18/2017   Lab Results  Component Value Date   IRON 59 11/18/2017   IRON 59 11/18/2017   FERRITIN 477 11/13/2017    Imaging and Procedures obtained prior to SNF admission: Dg Shoulder Left  Result Date: 12/20/2017 CLINICAL DATA:  Recent fall with left shoulder pain, initial encounter EXAM: LEFT SHOULDER - 2+ VIEW COMPARISON:  06/12/2013 FINDINGS: There is a comminuted fracture in the proximal left humerus just below the surgical neck with impaction and angulation at the fracture site. The humeral head appears well seated. The underlying bony thorax is within limits. No other focal abnormality is seen. IMPRESSION: Impacted and angulated fracture of the proximal left humerus just below the surgical neck. Electronically Signed   By: Inez Catalina M.D.   On: 12/20/2017 21:04    Assessment/Plan  1. Essential hypertension Blood pressures recently have been good.   Today 124/62 and at last visit 140/80.  Medications include metoprolol which also helps control her tachycardia.  2. Chronic bronchitis, unspecified chronic bronchitis type ([place]) No coughing during my encounter with patient but she does take duo neb treatments by nebulization 4 times daily as well as as needed albuterol.  3. Gait disorder There is a history of multiple fractures and falls but patient seems to have some safety awareness and realizes that she needs her wheelchair for ambulation.  She denies any skeletal or bony complaints today.   Family/ staff Communication: Findings communicated to nursing staff  Labs/tests ordered:  Lillette Boxer. [place] Heck, [place] [address] [place], [place] Office [phone]

## 2018-06-11 ENCOUNTER — Encounter: Payer: Self-pay | Admitting: Nurse Practitioner

## 2018-06-11 ENCOUNTER — Non-Acute Institutional Stay (SKILLED_NURSING_FACILITY): Payer: Medicare Other | Admitting: Nurse Practitioner

## 2018-06-11 DIAGNOSIS — H04123 Dry eye syndrome of bilateral lacrimal glands: Secondary | ICD-10-CM | POA: Diagnosis not present

## 2018-06-11 DIAGNOSIS — H1012 Acute atopic conjunctivitis, left eye: Secondary | ICD-10-CM

## 2018-06-11 DIAGNOSIS — H109 Unspecified conjunctivitis: Secondary | ICD-10-CM | POA: Insufficient documentation

## 2018-06-11 DIAGNOSIS — F0150 Vascular dementia without behavioral disturbance: Secondary | ICD-10-CM

## 2018-06-11 NOTE — Assessment & Plan Note (Signed)
Will try apply Naphcon A 1gtt bid to the right eye x 5 days. Observe.

## 2018-06-11 NOTE — Progress Notes (Signed)
Location:  [facility] Room Number: 42-B Place of Service:  SNF (31) Provider:  Marlana Latus  NP  Mast, Man X, NP  Patient Care Team: Mast, Man X, NP as PCP - General (Internal Medicine) [name], MD as Referring Physician (Ophthalmology)  Extended Emergency Contact Information Primary Emergency Contact: Shepheard,James Address: [address]          [place], [place] [address] [name] of [place] Phone: [phone] Mobile Phone: [phone] Relation: Son Secondary Emergency Contact: [name] Address: [address]          [place], [place] [address] [name] of [place] Phone: [phone] Mobile Phone: [phone] Relation: Son  Code Status:  DNR Goals of care: Advanced Directive information Advanced Directives 06/08/2018  Does Patient Have a Medical Advance Directive? Yes  Type of Advance Directive Out of facility DNR (pink MOST or yellow form)  Does patient want to make changes to medical advance directive? No - Patient declined  Copy of [place] in Chart? -  Would patient like information on creating a medical advance directive? -  Pre-existing out of facility DNR order (yellow form or pink MOST form) Yellow form placed in chart (order not valid for inpatient use);Pink MOST form placed in chart (order not valid for inpatient use)     Chief Complaint  Patient presents with  . Acute Visit    (L) eye redness w/o drainage    HPI:  Pt is a 84 y.o. female seen today for an acute visit for left eye itching and redness x 1 day, she denied pain or change of vision of the left eye. She is afebrile. HPI was provided with assistance of staff, she resides in SNF [facility] for safety and care assistance, on Memantine 10mg  bid for memory. Hx of dry eyes, on tears qid.    Past Medical History:  Diagnosis Date  . Anemia, iron deficiency    takes Ferrous Sulfate daily  . Depression    takes Effexor daily    . Gait disorder 04/21/2014  . GERD (gastroesophageal reflux disease)    takes Omeprazole daily  . Herpes ocular    history of-takes Acyclovir daily  . High cholesterol    takes Atorvastatin daily  . History of hiatal hernia   . Hypertension    takes Metoprolol daily  . Insomnia    takes Xanax nightly  . Osteoarthritis of knee    bilateral  . Sciatic pain    Past Surgical History:  Procedure Laterality Date  . ABDOMINAL HYSTERECTOMY  1995   partial  . CARPAL TUNNEL RELEASE Right 07/21/2007  . CARPAL TUNNEL RELEASE Left 09/24/2007  . COLONOSCOPY    . DESCEMETS STRIPPING AUTOMATED ENDOTHELIAL KERATOPLASTY Left 03/14/2011  . DESCEMETS STRIPPING AUTOMATED ENDOTHELIAL KERATOPLASTY Right 08/20/2012  . EYE SURGERY Bilateral    cataract surgery  . EYE SURGERY Left    corneal transplant  . HERNIA REPAIR    . HIP ARTHROPLASTY Left 06/13/2013   Procedure: ARTHROPLASTY BIPOLAR HIP; Injection left shoulder;  Surgeon: Johnny Bridge, MD;  Location: [facility];  Service: Orthopedics;  Laterality: Left;  . JOINT REPLACEMENT     bilateral knees, left knee  . KNEE ARTHROSCOPY Right 05/11/2001  . KYPHOPLASTY N/A 04/10/2015   Procedure: T11 Kyphoplasty;  Surgeon: Jovita Gamma, MD;  Location: [facility];  Service: Neurosurgery;  Laterality: N/A;  T11 Kyphoplasty  . KYPHOPLASTY N/A 05/06/2015   Procedure: KYPHOPLASTY Thoracic twelve;  Surgeon:  Jovita Gamma, MD;  Location: [facility];  Service: Neurosurgery;  Laterality: N/A;  T12 Kyphoplasty  . LAPAROSCOPIC NISSEN FUNDOPLICATION  2/35/5732  . LUMBAR LAMINECTOMY/DECOMPRESSION MICRODISCECTOMY  08/29/2010   L2-S1  . SHOULDER ARTHROSCOPY WITH ROTATOR CUFF REPAIR AND SUBACROMIAL DECOMPRESSION Right 01/01/2000  . TONSILLECTOMY     as child  . TOTAL HIP REVISION Left 07/08/2014   Procedure: TOTAL HIP REVISION;  Surgeon: Kerin Salen, MD;  Location: [facility];  Service: Orthopedics;  Laterality: Left;  . TOTAL KNEE ARTHROPLASTY Left 05/14/2004  . TOTAL  KNEE ARTHROPLASTY  12/23/2011   Procedure: TOTAL KNEE ARTHROPLASTY;  Surgeon: Lorn Junes, MD;  Location: [facility];  Service: Orthopedics;  Laterality: Right;  DR [place] THIS CASE  . TRIGGER FINGER RELEASE Right 07/21/2007   thumb  . TRIGGER FINGER RELEASE Left 09/24/2007   middle finger  . TRIGGER FINGER RELEASE Right 03/10/2008   ring and little fingers  . TRIGGER FINGER RELEASE Right 09/02/2012   Procedure: RELEASE TRIGGER FINGER/A-1 PULLEY RIGHT INDEX FINGER;  Surgeon: Wynonia Sours, MD;  Location: [facility];  Service: Orthopedics;  Laterality: Right;  . TRIGGER FINGER RELEASE Left 12/22/2012   Procedure: RELEASE TRIGGER FINGER/A-1 PULLEY LEFT RING FINGER;  Surgeon: Wynonia Sours, MD;  Location: [facility];  Service: Orthopedics;  Laterality: Left;  Left     Allergies  Allergen Reactions  . Morphine And Related Other (See Comments)    AGITATION, STRANGE DREAMS  . Scopolamine Other (See Comments)    MENTAL CHANGES  . Atorvastatin Other (See Comments)    unknown  . Morphine Other (See Comments)    Disoriented, mood changes  . Nsaids Other (See Comments)    unknown  . Pravachol [Pravastatin Sodium] Other (See Comments)    unknown  . Pravastatin Other (See Comments)    unknown  . Teriparatide Other (See Comments)    unknown    Outpatient Encounter Medications as of 06/11/2018  Medication Sig  . acetaminophen (TYLENOL) 500 MG tablet Take 500 mg by mouth every 6 (six) hours as needed for mild pain or moderate pain.  Marland Kitchen albuterol (PROVENTIL) (2.5 MG/3ML) 0.083% nebulizer solution Take 2.5 mg by nebulization every 4 (four) hours as needed for wheezing or shortness of breath.  . Artificial Tear Solution (GENTEAL TEARS) 0.1-0.2-0.3 % SOLN Apply 1 drop to eye at bedtime as needed.  Marland Kitchen aspirin EC 81 MG tablet Take 81 mg by mouth. To be given every 5 days.  . Cholecalciferol (VITAMIN D) 2000 units CAPS Take 2,000 Units by mouth daily.  Marland Kitchen  Dextran 70-Hypromellose (ARTIFICIAL TEARS) 0.1-0.3 % SOLN Apply 1 drop to eye 4 (four) times daily.  Marland Kitchen docusate sodium (COLACE) 100 MG capsule Take 1 capsule by mouth daily as needed for mild constipation.   . feeding supplement (BOOST / RESOURCE BREEZE) LIQD Take 1 Container by mouth 2 (two) times daily. For weight loss   . ferrous sulfate 325 (65 FE) MG tablet Take 325 mg by mouth daily.  . fluticasone furoate-vilanterol (BREO ELLIPTA) 100-25 MCG/INH AEPB Inhale 1 puff into the lungs daily.  Marland Kitchen ipratropium-albuterol (DUONEB) 0.5-2.5 (3) MG/3ML SOLN Take 3 mLs by nebulization every 6 (six) hours as needed.  . loratadine (CLARITIN) 10 MG tablet Take 10 mg by mouth daily as needed for allergies.  Marland Kitchen LORazepam (ATIVAN) 0.5 MG tablet Take 0.25 mg by mouth at bedtime.   . magnesium hydroxide (MILK OF MAGNESIA) 400 MG/5ML suspension  Take 15 mLs by mouth at bedtime as needed for mild constipation.   . memantine (NAMENDA) 10 MG tablet Take 10 mg by mouth 2 (two) times daily.  . metoprolol tartrate (LOPRESSOR) 25 MG tablet Take 25 mg by mouth at bedtime.   . metoprolol tartrate (LOPRESSOR) 50 MG tablet Take 50 mg by mouth every morning.  . nitroGLYCERIN (NITROSTAT) 0.4 MG SL tablet Place 0.4 mg under the tongue every 5 (five) minutes as needed for chest pain.  Marland Kitchen omeprazole (PRILOSEC) 10 MG capsule Take 10 mg by mouth daily.  Marland Kitchen venlafaxine XR (EFFEXOR-XR) 150 MG 24 hr capsule Take 150 mg by mouth at bedtime.    No facility-administered encounter medications on file as of 06/11/2018.     Review of Systems  Constitutional: Negative for activity change, appetite change, chills, diaphoresis, fatigue and fever.  HENT: Positive for hearing loss. Negative for congestion and voice change.   Eyes: Positive for redness and itching. Negative for photophobia, pain, discharge and visual disturbance.       Left eye  Respiratory: Positive for cough. Negative for shortness of breath and wheezing.        Chronic  hacking cough.   Cardiovascular: Negative for chest pain, palpitations and leg swelling.  Gastrointestinal: Negative for abdominal distention, abdominal pain, constipation, diarrhea, nausea and vomiting.  Genitourinary: Negative for difficulty urinating, dysuria and urgency.  Musculoskeletal: Positive for arthralgias and gait problem.  Skin: Negative for color change and pallor.  Neurological: Negative for dizziness, facial asymmetry, speech difficulty, weakness and headaches.       Dementia  Psychiatric/Behavioral: Negative for agitation, behavioral problems, hallucinations and sleep disturbance. The patient is not nervous/anxious.     Immunization History  Administered Date(s) Administered  . Influenza Whole 03/27/2018  . Influenza-Unspecified 04/14/2017  . Pneumococcal Polysaccharide-23 06/14/2013   Pertinent  Health Maintenance Due  Topic Date Due  . PNA vac Low Risk Adult (2 of 2 - PCV13) 06/14/2014  . INFLUENZA VACCINE  Completed  . DEXA SCAN  Completed   Fall Risk  09/19/2017  Falls in the past year? No   Functional Status Survey:    Vitals:   06/11/18 1202  BP: 110/60  Pulse: 72  Resp: 20  Temp: 98.4 F (36.9 C)  SpO2: 95%  Weight: 118 lb (53.5 kg)  Height: 5\' 1"  (1.549 m)   Body mass index is 22.3 kg/m. Physical Exam Constitutional:      Appearance: Normal appearance.  HENT:     Head: Normocephalic and atraumatic.     Nose: Nose normal. No congestion or rhinorrhea.     Mouth/Throat:     Mouth: Mucous membranes are moist.  Eyes:     Extraocular Movements: Extraocular movements intact.     Pupils: Pupils are equal, round, and reactive to light.     Comments: Itching, injected left conjunctiva.   Neck:     Musculoskeletal: Normal range of motion and neck supple.  Cardiovascular:     Rate and Rhythm: Normal rate and regular rhythm.     Heart sounds: No murmur.  Pulmonary:     Breath sounds: No wheezing, rhonchi or rales.     Comments: Decreased air  entry to both lungs.  Abdominal:     General: There is no distension.     Palpations: Abdomen is soft.     Tenderness: There is no abdominal tenderness. There is no guarding or rebound.  Musculoskeletal:        General: No swelling.  Comments: W/c for mobility.   Skin:    General: Skin is warm and dry.  Neurological:     General: No focal deficit present.     Mental Status: She is alert.     Cranial Nerves: No cranial nerve deficit.     Motor: No weakness.     Coordination: Coordination normal.     Gait: Gait abnormal.     Comments: Oriented to person and place.   Psychiatric:        Mood and Affect: Mood normal.        Behavior: Behavior normal.     Labs reviewed: Recent Labs    07/30/17 1113 08/03/17 1851 08/08/17 1135  11/13/17 11/18/17 03/18/18  NA 130* 131* 130*   < > 131* 131*  131* 132*  K 4.3 4.4 4.5   < > 4.3 4.7  4.7 4.4  CL 98* 96* 96*   < > 98 99 97  CO2 21* 26 22   < > 24 26 28   GLUCOSE 94 96 91  --   --   --   --   BUN 14 17 11    < > 12 17  17 12   CREATININE 0.79 0.72 0.67   < > 0.6 0.6  0.6 0.6  CALCIUM 8.9 9.1 9.1   < > 9.0 8.8 9.1   < > = values in this interval not displayed.   Recent Labs    07/30/17 1113 08/03/17 1851  11/04/17 11/13/17 11/18/17  AST 24 23   < > 20 18 18  18   ALT 16 16   < > 15 13 13  13   ALKPHOS 148* 142*   < > 207* 158* 156*  156*  BILITOT 0.3 0.3  --   --   --   --   PROT 6.6 7.5  --   --   --   --   ALBUMIN 3.4* 4.0  --  4.2 4.0 3.5   < > = values in this interval not displayed.   Recent Labs    07/30/17 1113 08/03/17 1851 08/08/17 1135  10/30/17 11/13/17 03/18/18  WBC 8.4 6.8 8.0   < > 5.3 5.4 8.7  NEUTROABS 5.2 3.5 4.8  --   --   --   --   HGB 12.1 13.0 13.1   < > 11.6* 12.9 12.6  HCT 35.8* 38.8 39.5   < > 34* 38 37  MCV 96.5 97.5 97.8  --   --   --   --   PLT 325 375 318   < > 363 369 308   < > = values in this interval not displayed.   Lab Results  Component Value Date   TSH 2.99 11/13/2017    Lab Results  Component Value Date   HGBA1C 5.8 06/29/2013   No results found for: CHOL, HDL, LDLCALC, LDLDIRECT, TRIG, CHOLHDL  Significant Diagnostic Results in last 30 days:  No results found.  Assessment/Plan Conjunctivitis of left eye Will try apply Naphcon A 1gtt bid to the right eye x 5 days. Observe.   Dry eyes, bilateral Continue artificial tears qid.   Vascular dementia Continue SNF [facility] for safety and care assistance, continue Memantine 10mg  bid for memory.      Family/ staff Communication: plan of care reviewed with the patient and charge nurse.   Labs/tests ordered:  None  Time spend 25 minutes.

## 2018-06-11 NOTE — Assessment & Plan Note (Signed)
Continue SNF [facility] for safety and care assistance, continue Memantine 10mg bid for memory 

## 2018-06-11 NOTE — Assessment & Plan Note (Signed)
Continue artificial tears qid.

## 2018-07-08 ENCOUNTER — Encounter: Payer: Self-pay | Admitting: Nurse Practitioner

## 2018-07-08 ENCOUNTER — Non-Acute Institutional Stay (SKILLED_NURSING_FACILITY): Payer: Medicare Other | Admitting: Nurse Practitioner

## 2018-07-08 DIAGNOSIS — K219 Gastro-esophageal reflux disease without esophagitis: Secondary | ICD-10-CM

## 2018-07-08 DIAGNOSIS — J42 Unspecified chronic bronchitis: Secondary | ICD-10-CM | POA: Diagnosis not present

## 2018-07-08 DIAGNOSIS — D509 Iron deficiency anemia, unspecified: Secondary | ICD-10-CM

## 2018-07-08 DIAGNOSIS — R000 Tachycardia, unspecified: Secondary | ICD-10-CM | POA: Diagnosis not present

## 2018-07-08 DIAGNOSIS — F418 Other specified anxiety disorders: Secondary | ICD-10-CM | POA: Diagnosis not present

## 2018-07-08 DIAGNOSIS — F0150 Vascular dementia without behavioral disturbance: Secondary | ICD-10-CM

## 2018-07-08 DIAGNOSIS — I10 Essential (primary) hypertension: Secondary | ICD-10-CM

## 2018-07-08 NOTE — Assessment & Plan Note (Signed)
Stable, continue Memantine 10mg  bid for memory, continue SNF [facility] for safety and care assistance.

## 2018-07-08 NOTE — Assessment & Plan Note (Signed)
Stable, continue Breo ellipta daily.

## 2018-07-08 NOTE — Assessment & Plan Note (Signed)
Stable, continue Omeprazole 10mg qd.  

## 2018-07-08 NOTE — Assessment & Plan Note (Signed)
Heart rate is in control, continue Metoprolol  

## 2018-07-08 NOTE — Progress Notes (Signed)
Location:  [facility] Room Number: 42-B Place of Service:  SNF (31) Provider:  Marlana Latus  NP  Julieann Drummonds X, NP  Patient Care Team: Dermot Gremillion X, NP as PCP - General (Internal Medicine) [name], MD as Referring Physician (Ophthalmology)  Extended Emergency Contact Information Primary Emergency Contact: Musgrave,James Address: [address]          [place], [place] [address] [name] of [place] Phone: [phone] Mobile Phone: [phone] Relation: Son Secondary Emergency Contact: [name] Address: [address]          [place], [place] [address] [name] of [place] Phone: [phone] Mobile Phone: [phone] Relation: Son  Code Status:  DNR Goals of care: Advanced Directive information Advanced Directives 07/08/2018  Does Patient Have a Medical Advance Directive? Yes  Type of Advance Directive Out of facility DNR (pink MOST or yellow form)  Does patient want to make changes to medical advance directive? No - Patient declined  Copy of [place] in Chart? -  Would patient like information on creating a medical advance directive? -  Pre-existing out of facility DNR order (yellow form or pink MOST form) Pink MOST form placed in chart (order not valid for inpatient use);Yellow form placed in chart (order not valid for inpatient use)     Chief Complaint  Patient presents with  . Medical Management of Chronic Issues    HPI:  Pt is a 85 y.o. female seen today for medical management of chronic diseases.     The patient resides in SN[facility] for safety and care assistance, on Memantine 10mg  bid for memory. Her mood is stable on Effexor 75mg  qd, Lorazepam 0.59m qhs. GERD stable on Omeprazole 10mg  qd. Tachycardia, heart rate is in control, on Metoprolol 50mg  qd, 25mg  qd. COPD, stable on Breo Ellipta daily. Anemia, stable, last Hgb 12.6 03/18/18, on Fe daily.  Past Medical History:  Diagnosis Date  .  Anemia, iron deficiency    takes Ferrous Sulfate daily  . Depression    takes Effexor daily  . Gait disorder 04/21/2014  . GERD (gastroesophageal reflux disease)    takes Omeprazole daily  . Herpes ocular    history of-takes Acyclovir daily  . High cholesterol    takes Atorvastatin daily  . History of hiatal hernia   . Hypertension    takes Metoprolol daily  . Insomnia    takes Xanax nightly  . Osteoarthritis of knee    bilateral  . Sciatic pain    Past Surgical History:  Procedure Laterality Date  . ABDOMINAL HYSTERECTOMY  1995   partial  . CARPAL TUNNEL RELEASE Right 07/21/2007  . CARPAL TUNNEL RELEASE Left 09/24/2007  . COLONOSCOPY    . DESCEMETS STRIPPING AUTOMATED ENDOTHELIAL KERATOPLASTY Left 03/14/2011  . DESCEMETS STRIPPING AUTOMATED ENDOTHELIAL KERATOPLASTY Right 08/20/2012  . EYE SURGERY Bilateral    cataract surgery  . EYE SURGERY Left    corneal transplant  . HERNIA REPAIR    . HIP ARTHROPLASTY Left 06/13/2013   Procedure: ARTHROPLASTY BIPOLAR HIP; Injection left shoulder;  Surgeon: Johnny Bridge, MD;  Location: [facility];  Service: Orthopedics;  Laterality: Left;  . JOINT REPLACEMENT     bilateral knees, left knee  . KNEE ARTHROSCOPY Right 05/11/2001  . KYPHOPLASTY N/A 04/10/2015   Procedure: T11 Kyphoplasty;  Surgeon: Jovita Gamma, MD;  Location: [facility];  Service: Neurosurgery;  Laterality: N/A;  T11 Kyphoplasty  . KYPHOPLASTY N/A 05/06/2015  Procedure: KYPHOPLASTY Thoracic twelve;  Surgeon: Jovita Gamma, MD;  Location: [place] [facility];  Service: Neurosurgery;  Laterality: N/A;  T12 Kyphoplasty  . LAPAROSCOPIC NISSEN FUNDOPLICATION  1/61/0960  . LUMBAR LAMINECTOMY/DECOMPRESSION MICRODISCECTOMY  08/29/2010   L2-S1  . SHOULDER ARTHROSCOPY WITH ROTATOR CUFF REPAIR AND SUBACROMIAL DECOMPRESSION Right 01/01/2000  . TONSILLECTOMY     as child  . TOTAL HIP REVISION Left 07/08/2014   Procedure: TOTAL HIP REVISION;  Surgeon: Kerin Salen, MD;  Location: [facility];   Service: Orthopedics;  Laterality: Left;  . TOTAL KNEE ARTHROPLASTY Left 05/14/2004  . TOTAL KNEE ARTHROPLASTY  12/23/2011   Procedure: TOTAL KNEE ARTHROPLASTY;  Surgeon: Lorn Junes, MD;  Location: [facility];  Service: Orthopedics;  Laterality: Right;  DR [place] THIS CASE  . TRIGGER FINGER RELEASE Right 07/21/2007   thumb  . TRIGGER FINGER RELEASE Left 09/24/2007   middle finger  . TRIGGER FINGER RELEASE Right 03/10/2008   ring and little fingers  . TRIGGER FINGER RELEASE Right 09/02/2012   Procedure: RELEASE TRIGGER FINGER/A-1 PULLEY RIGHT INDEX FINGER;  Surgeon: Wynonia Sours, MD;  Location: [facility];  Service: Orthopedics;  Laterality: Right;  . TRIGGER FINGER RELEASE Left 12/22/2012   Procedure: RELEASE TRIGGER FINGER/A-1 PULLEY LEFT RING FINGER;  Surgeon: Wynonia Sours, MD;  Location: [facility];  Service: Orthopedics;  Laterality: Left;  Left     Allergies  Allergen Reactions  . Morphine And Related Other (See Comments)    AGITATION, STRANGE DREAMS  . Scopolamine Other (See Comments)    MENTAL CHANGES  . Atorvastatin Other (See Comments)    unknown  . Morphine Other (See Comments)    Disoriented, mood changes  . Nsaids Other (See Comments)    unknown  . Pravachol [Pravastatin Sodium] Other (See Comments)    unknown  . Pravastatin Other (See Comments)    unknown  . Teriparatide Other (See Comments)    unknown    Outpatient Encounter Medications as of 07/08/2018  Medication Sig  . acetaminophen (TYLENOL) 500 MG tablet Take 500 mg by mouth every 6 (six) hours as needed for mild pain or moderate pain.  . Artificial Tear Solution (GENTEAL TEARS) 0.1-0.2-0.3 % SOLN Apply 1 drop to eye at bedtime as needed.  Marland Kitchen aspirin EC 81 MG tablet Take 81 mg by mouth. To be given every 5 days.  . Cholecalciferol (VITAMIN D) 2000 units CAPS Take 2,000 Units by mouth daily.  Marland Kitchen Dextran 70-Hypromellose (ARTIFICIAL TEARS) 0.1-0.3 % SOLN Apply 1 drop to  eye 4 (four) times daily.  Marland Kitchen docusate sodium (COLACE) 100 MG capsule Take 1 capsule by mouth daily as needed for mild constipation.   . feeding supplement (BOOST / RESOURCE BREEZE) LIQD Take 1 Container by mouth 2 (two) times daily. For weight loss   . ferrous sulfate 325 (65 FE) MG tablet Take 325 mg by mouth daily.  . fluticasone furoate-vilanterol (BREO ELLIPTA) 100-25 MCG/INH AEPB Inhale 1 puff into the lungs daily.  Marland Kitchen LORazepam (ATIVAN) 0.5 MG tablet Take 0.125 mg by mouth at bedtime.   . magnesium hydroxide (MILK OF MAGNESIA) 400 MG/5ML suspension Take 15 mLs by mouth at bedtime as needed for mild constipation.   . memantine (NAMENDA) 10 MG tablet Take 10 mg by mouth 2 (two) times daily.  . metoprolol tartrate (LOPRESSOR) 25 MG tablet Take 25 mg by mouth at bedtime.   . metoprolol tartrate (LOPRESSOR) 50 MG tablet Take 50 mg  by mouth every morning.  . nitroGLYCERIN (NITROSTAT) 0.4 MG SL tablet Place 0.4 mg under the tongue every 5 (five) minutes as needed for chest pain.  Marland Kitchen omeprazole (PRILOSEC) 10 MG capsule Take 10 mg by mouth daily.  Marland Kitchen venlafaxine XR (EFFEXOR-XR) 75 MG 24 hr capsule Take 75 mg by mouth daily.  . [DISCONTINUED] albuterol (PROVENTIL) (2.5 MG/3ML) 0.083% nebulizer solution Take 2.5 mg by nebulization every 4 (four) hours as needed for wheezing or shortness of breath.  . [DISCONTINUED] ipratropium-albuterol (DUONEB) 0.5-2.5 (3) MG/3ML SOLN Take 3 mLs by nebulization every 6 (six) hours as needed.  . [DISCONTINUED] loratadine (CLARITIN) 10 MG tablet Take 10 mg by mouth daily as needed for allergies.  . [DISCONTINUED] venlafaxine XR (EFFEXOR-XR) 150 MG 24 hr capsule Take 150 mg by mouth at bedtime.    No facility-administered encounter medications on file as of 07/08/2018.    ROS was provided with assistance of staff Review of Systems  Constitutional: Negative for activity change, appetite change, chills, diaphoresis, fatigue, fever and unexpected weight change.  HENT:  Positive for hearing loss. Negative for congestion and voice change.   Respiratory: Positive for cough. Negative for shortness of breath and wheezing.        Chronic hacking cough.   Cardiovascular: Negative for chest pain, palpitations and leg swelling.  Gastrointestinal: Negative for abdominal distention, abdominal pain, constipation, diarrhea, nausea and vomiting.  Genitourinary: Negative for difficulty urinating and dysuria.  Musculoskeletal: Positive for arthralgias, back pain and gait problem.  Skin: Negative for color change and pallor.  Neurological: Negative for dizziness, speech difficulty, weakness and headaches.  Psychiatric/Behavioral: Negative for agitation, behavioral problems, hallucinations and sleep disturbance. The patient is not nervous/anxious.     Immunization History  Administered Date(s) Administered  . Influenza Whole 03/27/2018  . Influenza-Unspecified 04/14/2017  . Pneumococcal Polysaccharide-23 06/14/2013   Pertinent  Health Maintenance Due  Topic Date Due  . PNA vac Low Risk Adult (2 of 2 - PCV13) 06/14/2014  . INFLUENZA VACCINE  Completed  . DEXA SCAN  Completed   Fall Risk  09/19/2017  Falls in the past year? No   Functional Status Survey:    Vitals:   07/08/18 0919  BP: 120/70  Pulse: 70  Resp: 20  Temp: 98.2 F (36.8 C)  SpO2: 95%  Weight: 119 lb 1.6 oz (54 kg)  Height: 5\' 1"  (1.549 m)   Body mass index is 22.5 kg/m. Physical Exam Constitutional:      General: She is not in acute distress.    Appearance: Normal appearance. She is normal weight. She is not ill-appearing, toxic-appearing or diaphoretic.  HENT:     Nose: Nose normal. No congestion or rhinorrhea.     Mouth/Throat:     Mouth: Mucous membranes are moist.  Eyes:     Extraocular Movements: Extraocular movements intact.     Conjunctiva/sclera: Conjunctivae normal.     Pupils: Pupils are equal, round, and reactive to light.  Neck:     Musculoskeletal: Normal range of  motion and neck supple.  Cardiovascular:     Rate and Rhythm: Normal rate and regular rhythm.     Heart sounds: No murmur.  Pulmonary:     Effort: Pulmonary effort is normal.     Breath sounds: No wheezing, rhonchi or rales.     Comments: Decreased air entry.  Abdominal:     General: There is no distension.     Palpations: Abdomen is soft.     Tenderness: There  is no abdominal tenderness. There is no guarding or rebound.  Musculoskeletal:     Right lower leg: No edema.     Left lower leg: No edema.     Comments: W/c for mobility.   Skin:    General: Skin is warm and dry.  Neurological:     General: No focal deficit present.     Mental Status: She is alert. Mental status is at baseline.     Cranial Nerves: No cranial nerve deficit.     Motor: No weakness.     Coordination: Coordination normal.     Gait: Gait abnormal.     Comments: Oriented to person and place.   Psychiatric:        Mood and Affect: Mood normal.        Behavior: Behavior normal.     Labs reviewed: Recent Labs    07/30/17 1113 08/03/17 1851 08/08/17 1135  11/13/17 11/18/17 03/18/18  NA 130* 131* 130*   < > 131* 131*  131* 132*  K 4.3 4.4 4.5   < > 4.3 4.7  4.7 4.4  CL 98* 96* 96*   < > 98 99 97  CO2 21* 26 22   < > 24 26 28   GLUCOSE 94 96 91  --   --   --   --   BUN 14 17 11    < > 12 17  17 12   CREATININE 0.79 0.72 0.67   < > 0.6 0.6  0.6 0.6  CALCIUM 8.9 9.1 9.1   < > 9.0 8.8 9.1   < > = values in this interval not displayed.   Recent Labs    07/30/17 1113 08/03/17 1851  11/04/17 11/13/17 11/18/17  AST 24 23   < > 20 18 18  18   ALT 16 16   < > 15 13 13  13   ALKPHOS 148* 142*   < > 207* 158* 156*  156*  BILITOT 0.3 0.3  --   --   --   --   PROT 6.6 7.5  --   --   --   --   ALBUMIN 3.4* 4.0  --  4.2 4.0 3.5   < > = values in this interval not displayed.   Recent Labs    07/30/17 1113 08/03/17 1851 08/08/17 1135  10/30/17 11/13/17 03/18/18  WBC 8.4 6.8 8.0   < > 5.3 5.4 8.7    NEUTROABS 5.2 3.5 4.8  --   --   --   --   HGB 12.1 13.0 13.1   < > 11.6* 12.9 12.6  HCT 35.8* 38.8 39.5   < > 34* 38 37  MCV 96.5 97.5 97.8  --   --   --   --   PLT 325 375 318   < > 363 369 308   < > = values in this interval not displayed.   Lab Results  Component Value Date   TSH 2.99 11/13/2017   Lab Results  Component Value Date   HGBA1C 5.8 06/29/2013   No results found for: CHOL, HDL, LDLCALC, LDLDIRECT, TRIG, CHOLHDL  Significant Diagnostic Results in last 30 days:  No results found.  Assessment/Plan Chronic bronchitis (HCC) Stable, continue Breo ellipta daily.   Hypertension Controlled blood pressure, continue Metoprolol 50mg  qd, 25mg  qd.   GERD (gastroesophageal reflux disease) Stable, continue Omeprazole 10mg  qd.   Vascular dementia Stable, continue Memantine 10mg  bid for memory, continue SNF [facility] for safety  and care assistance.   Depression with anxiety Her mood is stable, continue Effexor 75mg  qd, Lorazepam 0.5mg  qhs.   Iron deficiency anemia Stable, last Hgb 12.6 03/18/18, continue Fe daily.   Tachycardia Heart rate is in control, continue Metoprolol      Family/ staff Communication: plan of care reviewed with the patient and charge nurse.   Labs/tests ordered: none   Time spend 25 minutes.

## 2018-07-08 NOTE — Assessment & Plan Note (Signed)
Stable, last Hgb 12.6 03/18/18, continue Fe daily.

## 2018-07-08 NOTE — Assessment & Plan Note (Signed)
Controlled blood pressure, continue Metoprolol 50mg  qd, 25mg  qd.

## 2018-07-08 NOTE — Assessment & Plan Note (Signed)
Her mood is stable, continue Effexor 75mg  qd, Lorazepam 0.5mg  qhs.

## 2018-07-10 ENCOUNTER — Encounter: Payer: Self-pay | Admitting: Nurse Practitioner

## 2018-07-17 ENCOUNTER — Non-Acute Institutional Stay (SKILLED_NURSING_FACILITY): Payer: Medicare Other | Admitting: Nurse Practitioner

## 2018-07-17 ENCOUNTER — Encounter: Payer: Self-pay | Admitting: Nurse Practitioner

## 2018-07-17 DIAGNOSIS — F0150 Vascular dementia without behavioral disturbance: Secondary | ICD-10-CM

## 2018-07-17 DIAGNOSIS — H1032 Unspecified acute conjunctivitis, left eye: Secondary | ICD-10-CM

## 2018-07-17 NOTE — Progress Notes (Signed)
Location:  [place] Room Number: 62 Place of Service:  SNF (31) Provider: Abdul Beirne X,NP  Shloima Clinch X, NP  Patient Care Team: Milla Wahlberg X, NP as PCP - General (Internal Medicine) [name], MD as Referring Physician (Ophthalmology)  Extended Emergency Contact Information Primary Emergency Contact: [facility] Address: [address]          [place], [place] [address] [name] of [place] Phone: [phone] Mobile Phone: [phone] Relation: Son Secondary Emergency Contact: [name] Address: [address]          [place], [place] [address] [name] of [place] Phone: [phone] Mobile Phone: [phone] Relation: Son  Code Status:  DNR Goals of care: Advanced Directive information Advanced Directives 07/17/2018  Does Patient Have a Medical Advance Directive? Yes  Type of Advance Directive Out of facility DNR (pink MOST or yellow form)  Does patient want to make changes to medical advance directive? No - Patient declined  Copy of [place] in Chart? -  Would patient like information on creating a medical advance directive? -  Pre-existing out of facility DNR order (yellow form or pink MOST form) Yellow form placed in chart (order not valid for inpatient use);Pink MOST form placed in chart (order not valid for inpatient use)     Chief Complaint  Patient presents with  . Acute Visit    left eye redness     HPI:  Pt is a 85 y.o. female seen today for an acute visit for left eye tearing and redness x 1 day. The patient stated she feels there is something in the left lower eye, she denied pain, itching, change of vision. HPI was provided with assistance of staff, she resides in SNF [facility] for safety and care assistance, taking Memantine 10mg  bid for memory.    Past Medical History:  Diagnosis Date  . Anemia, iron deficiency    takes Ferrous Sulfate daily  . Depression    takes Effexor daily    . Gait disorder 04/21/2014  . GERD (gastroesophageal reflux disease)    takes Omeprazole daily  . Herpes ocular    history of-takes Acyclovir daily  . High cholesterol    takes Atorvastatin daily  . History of hiatal hernia   . Hypertension    takes Metoprolol daily  . Insomnia    takes Xanax nightly  . Osteoarthritis of knee    bilateral  . Sciatic pain    Past Surgical History:  Procedure Laterality Date  . ABDOMINAL HYSTERECTOMY  1995   partial  . CARPAL TUNNEL RELEASE Right 07/21/2007  . CARPAL TUNNEL RELEASE Left 09/24/2007  . COLONOSCOPY    . DESCEMETS STRIPPING AUTOMATED ENDOTHELIAL KERATOPLASTY Left 03/14/2011  . DESCEMETS STRIPPING AUTOMATED ENDOTHELIAL KERATOPLASTY Right 08/20/2012  . EYE SURGERY Bilateral    cataract surgery  . EYE SURGERY Left    corneal transplant  . HERNIA REPAIR    . HIP ARTHROPLASTY Left 06/13/2013   Procedure: ARTHROPLASTY BIPOLAR HIP; Injection left shoulder;  Surgeon: Johnny Bridge, MD;  Location: [facility];  Service: Orthopedics;  Laterality: Left;  . JOINT REPLACEMENT     bilateral knees, left knee  . KNEE ARTHROSCOPY Right 05/11/2001  . KYPHOPLASTY N/A 04/10/2015   Procedure: T11 Kyphoplasty;  Surgeon: Jovita Gamma, MD;  Location: [facility];  Service: Neurosurgery;  Laterality: N/A;  T11 Kyphoplasty  . KYPHOPLASTY N/A 05/06/2015   Procedure: KYPHOPLASTY Thoracic twelve;  Surgeon: Jovita Gamma, MD;  Location:  [facility];  Service: Neurosurgery;  Laterality: N/A;  T12 Kyphoplasty  . LAPAROSCOPIC NISSEN FUNDOPLICATION  4/40/3474  . LUMBAR LAMINECTOMY/DECOMPRESSION MICRODISCECTOMY  08/29/2010   L2-S1  . SHOULDER ARTHROSCOPY WITH ROTATOR CUFF REPAIR AND SUBACROMIAL DECOMPRESSION Right 01/01/2000  . TONSILLECTOMY     as child  . TOTAL HIP REVISION Left 07/08/2014   Procedure: TOTAL HIP REVISION;  Surgeon: Kerin Salen, MD;  Location: [facility] [place];  Service: Orthopedics;  Laterality: Left;  . TOTAL KNEE ARTHROPLASTY Left 05/14/2004  . TOTAL  KNEE ARTHROPLASTY  12/23/2011   Procedure: TOTAL KNEE ARTHROPLASTY;  Surgeon: Lorn Junes, MD;  Location: [facility];  Service: Orthopedics;  Laterality: Right;  DR [place] THIS CASE  . TRIGGER FINGER RELEASE Right 07/21/2007   thumb  . TRIGGER FINGER RELEASE Left 09/24/2007   middle finger  . TRIGGER FINGER RELEASE Right 03/10/2008   ring and little fingers  . TRIGGER FINGER RELEASE Right 09/02/2012   Procedure: RELEASE TRIGGER FINGER/A-1 PULLEY RIGHT INDEX FINGER;  Surgeon: Wynonia Sours, MD;  Location: [place];  Service: Orthopedics;  Laterality: Right;  . TRIGGER FINGER RELEASE Left 12/22/2012   Procedure: RELEASE TRIGGER FINGER/A-1 PULLEY LEFT RING FINGER;  Surgeon: Wynonia Sours, MD;  Location: [facility];  Service: Orthopedics;  Laterality: Left;  Left     Allergies  Allergen Reactions  . Morphine And Related Other (See Comments)    AGITATION, STRANGE DREAMS  . Scopolamine Other (See Comments)    MENTAL CHANGES  . Atorvastatin Other (See Comments)    unknown  . Morphine Other (See Comments)    Disoriented, mood changes  . Nsaids Other (See Comments)    unknown  . Pravachol [Pravastatin Sodium] Other (See Comments)    unknown  . Pravastatin Other (See Comments)    unknown  . Teriparatide Other (See Comments)    unknown    Outpatient Encounter Medications as of 07/17/2018  Medication Sig  . acetaminophen (TYLENOL) 500 MG tablet Take 500 mg by mouth every 6 (six) hours as needed for mild pain or moderate pain.  . Artificial Tear Solution (GENTEAL TEARS) 0.1-0.2-0.3 % SOLN Apply 1 drop to eye at bedtime as needed.  Marland Kitchen aspirin EC 81 MG tablet Take 81 mg by mouth. To be given every 5 days.  . Cholecalciferol (VITAMIN D) 2000 units CAPS Take 2,000 Units by mouth daily.  Marland Kitchen Dextran 70-Hypromellose (ARTIFICIAL TEARS) 0.1-0.3 % SOLN Apply 1 drop to eye 4 (four) times daily.  Marland Kitchen docusate sodium (COLACE) 100 MG capsule Take 1 capsule by mouth  daily as needed for mild constipation.   . feeding supplement (BOOST / RESOURCE BREEZE) LIQD Take 1 Container by mouth 2 (two) times daily. For weight loss   . ferrous sulfate 325 (65 FE) MG tablet Take 325 mg by mouth daily.  . fluticasone furoate-vilanterol (BREO ELLIPTA) 100-25 MCG/INH AEPB Inhale 1 puff into the lungs daily.  Marland Kitchen LORazepam (ATIVAN) 0.5 MG tablet Take 0.125 mg by mouth at bedtime.   . magnesium hydroxide (MILK OF MAGNESIA) 400 MG/5ML suspension Take 15 mLs by mouth at bedtime as needed for mild constipation.   . memantine (NAMENDA) 10 MG tablet Take 10 mg by mouth 2 (two) times daily.  . metoprolol tartrate (LOPRESSOR) 25 MG tablet Take 25 mg by mouth at bedtime.   . metoprolol tartrate (LOPRESSOR) 50 MG tablet Take 50 mg by mouth every morning.  . nitroGLYCERIN (NITROSTAT) 0.4 MG SL  tablet Place 0.4 mg under the tongue every 5 (five) minutes as needed for chest pain.  Marland Kitchen omeprazole (PRILOSEC) 10 MG capsule Take 10 mg by mouth daily.  Marland Kitchen venlafaxine XR (EFFEXOR-XR) 75 MG 24 hr capsule Take 75 mg by mouth daily.   No facility-administered encounter medications on file as of 07/17/2018.    ROS was provided with assistance of staff Review of Systems  Constitutional: Negative for activity change, appetite change, chills, diaphoresis, fatigue, fever and unexpected weight change.  HENT: Positive for hearing loss. Negative for congestion, ear pain, rhinorrhea, sinus pressure, sinus pain, sore throat and voice change.   Eyes: Positive for discharge and redness. Negative for photophobia, pain, itching and visual disturbance.       Left lower eye redness and tearing.   Neurological: Negative for dizziness, facial asymmetry, speech difficulty, weakness and headaches.       Dementia.   Psychiatric/Behavioral: Negative for agitation, behavioral problems, hallucinations and sleep disturbance. The patient is not nervous/anxious.     Immunization History  Administered Date(s) Administered    . Influenza Whole 03/27/2018  . Influenza-Unspecified 04/14/2017  . Pneumococcal Polysaccharide-23 06/14/2013   Pertinent  Health Maintenance Due  Topic Date Due  . PNA vac Low Risk Adult (2 of 2 - PCV13) 06/14/2014  . INFLUENZA VACCINE  Completed  . DEXA SCAN  Completed   Fall Risk  09/19/2017  Falls in the past year? No   Functional Status Survey:    Vitals:   07/17/18 1649  BP: 120/68  Pulse: 74  Resp: 20  Temp: 98.2 F (36.8 C)  SpO2: 95%  Weight: 119 lb 1.6 oz (54 kg)  Height: 5\' 1"  (1.549 m)   Body mass index is 22.5 kg/m. Physical Exam Constitutional:      General: She is not in acute distress.    Appearance: Normal appearance. She is not ill-appearing, toxic-appearing or diaphoretic.  HENT:     Head: Normocephalic and atraumatic.     Nose: Nose normal.     Mouth/Throat:     Mouth: Mucous membranes are moist.  Eyes:     General: No scleral icterus.       Right eye: No discharge.        Left eye: Discharge present.    Extraocular Movements: Extraocular movements intact.     Pupils: Pupils are equal, round, and reactive to light.     Comments: Left lower conjunctival redness, tearing, no foreign objects seen upon my naked eye exam.   Neurological:     General: No focal deficit present.     Mental Status: She is alert. Mental status is at baseline.     Cranial Nerves: No cranial nerve deficit.     Motor: No weakness.     Coordination: Coordination normal.     Gait: Gait abnormal.     Comments: Oriented to person and place.  Psychiatric:        Mood and Affect: Mood normal.        Behavior: Behavior normal.     Labs reviewed: Recent Labs    07/30/17 1113 08/03/17 1851 08/08/17 1135  11/13/17 11/18/17 03/18/18  NA 130* 131* 130*   < > 131* 131*  131* 132*  K 4.3 4.4 4.5   < > 4.3 4.7  4.7 4.4  CL 98* 96* 96*   < > 98 99 97  CO2 21* 26 22   < > 24 26 28   GLUCOSE 94 96 91  --   --   --   --  BUN 14 17 11    < > 12 17  17 12   CREATININE 0.79  0.72 0.67   < > 0.6 0.6  0.6 0.6  CALCIUM 8.9 9.1 9.1   < > 9.0 8.8 9.1   < > = values in this interval not displayed.   Recent Labs    07/30/17 1113 08/03/17 1851  11/04/17 11/13/17 11/18/17  AST 24 23   < > 20 18 18  18   ALT 16 16   < > 15 13 13  13   ALKPHOS 148* 142*   < > 207* 158* 156*  156*  BILITOT 0.3 0.3  --   --   --   --   PROT 6.6 7.5  --   --   --   --   ALBUMIN 3.4* 4.0  --  4.2 4.0 3.5   < > = values in this interval not displayed.   Recent Labs    07/30/17 1113 08/03/17 1851 08/08/17 1135  10/30/17 11/13/17 03/18/18  WBC 8.4 6.8 8.0   < > 5.3 5.4 8.7  NEUTROABS 5.2 3.5 4.8  --   --   --   --   HGB 12.1 13.0 13.1   < > 11.6* 12.9 12.6  HCT 35.8* 38.8 39.5   < > 34* 38 37  MCV 96.5 97.5 97.8  --   --   --   --   PLT 325 375 318   < > 363 369 308   < > = values in this interval not displayed.   Lab Results  Component Value Date   TSH 2.99 11/13/2017   Lab Results  Component Value Date   HGBA1C 5.8 06/29/2013   No results found for: CHOL, HDL, LDLCALC, LDLDIRECT, TRIG, CHOLHDL  Significant Diagnostic Results in last 30 days:  No results found.  Assessment/Plan Conjunctivitis of left eye Injected left lower eye, tearing, no foreign object seen in the left eye  by my naked eye exam, will have artificial tears 2 gtt q2h while awake x 72hours. May consider ophthalmology or ED if worsens.   Vascular dementia Continue SNF [facility] for safety and care assistance, continue Memantine 10mg  bid for memory.      Family/ staff Communication: plan of care reviewed with the patient and charge nurse.   Labs/tests ordered:  none  Time spend 25 minutes

## 2018-07-20 ENCOUNTER — Encounter: Payer: Self-pay | Admitting: Nurse Practitioner

## 2018-07-20 NOTE — Assessment & Plan Note (Signed)
Injected left lower eye, tearing, no foreign object seen in the left eye  by my naked eye exam, will have artificial tears 2 gtt q2h while awake x 72hours. May consider ophthalmology or ED if worsens.

## 2018-07-20 NOTE — Assessment & Plan Note (Signed)
Continue SNF [facility] for safety and care assistance, continue Memantine 10mg bid for memory 

## 2018-07-22 ENCOUNTER — Other Ambulatory Visit: Payer: Self-pay | Admitting: *Deleted

## 2018-07-22 DIAGNOSIS — F418 Other specified anxiety disorders: Secondary | ICD-10-CM

## 2018-07-22 MED ORDER — LORAZEPAM 0.5 MG PO TABS: 0.5000 mg | ORAL_TABLET | Freq: Every day | ORAL | 0 refills | Status: DC

## 2018-07-29 DIAGNOSIS — H02403 Unspecified ptosis of bilateral eyelids: Secondary | ICD-10-CM | POA: Diagnosis not present

## 2018-07-29 DIAGNOSIS — Z961 Presence of intraocular lens: Secondary | ICD-10-CM | POA: Diagnosis not present

## 2018-07-29 DIAGNOSIS — H0221C Cicatricial lagophthalmos, bilateral, upper and lower eyelids: Secondary | ICD-10-CM | POA: Diagnosis not present

## 2018-07-29 DIAGNOSIS — H04123 Dry eye syndrome of bilateral lacrimal glands: Secondary | ICD-10-CM | POA: Diagnosis not present

## 2018-07-29 DIAGNOSIS — Z947 Corneal transplant status: Secondary | ICD-10-CM | POA: Diagnosis not present

## 2018-07-29 DIAGNOSIS — H40003 Preglaucoma, unspecified, bilateral: Secondary | ICD-10-CM | POA: Diagnosis not present

## 2018-07-29 DIAGNOSIS — H3530 Unspecified macular degeneration: Secondary | ICD-10-CM | POA: Diagnosis not present

## 2018-07-31 DIAGNOSIS — R531 Weakness: Secondary | ICD-10-CM | POA: Diagnosis not present

## 2018-07-31 DIAGNOSIS — K5900 Constipation, unspecified: Secondary | ICD-10-CM | POA: Diagnosis not present

## 2018-07-31 DIAGNOSIS — M6281 Muscle weakness (generalized): Secondary | ICD-10-CM | POA: Diagnosis not present

## 2018-07-31 DIAGNOSIS — R2681 Unsteadiness on feet: Secondary | ICD-10-CM | POA: Diagnosis not present

## 2018-07-31 DIAGNOSIS — D509 Iron deficiency anemia, unspecified: Secondary | ICD-10-CM | POA: Diagnosis not present

## 2018-07-31 DIAGNOSIS — Z9181 History of falling: Secondary | ICD-10-CM | POA: Diagnosis not present

## 2018-07-31 DIAGNOSIS — R4189 Other symptoms and signs involving cognitive functions and awareness: Secondary | ICD-10-CM | POA: Diagnosis not present

## 2018-07-31 DIAGNOSIS — M545 Low back pain: Secondary | ICD-10-CM | POA: Diagnosis not present

## 2018-07-31 DIAGNOSIS — I10 Essential (primary) hypertension: Secondary | ICD-10-CM | POA: Diagnosis not present

## 2018-07-31 DIAGNOSIS — J45901 Unspecified asthma with (acute) exacerbation: Secondary | ICD-10-CM | POA: Diagnosis not present

## 2018-08-04 DIAGNOSIS — R2681 Unsteadiness on feet: Secondary | ICD-10-CM | POA: Diagnosis not present

## 2018-08-04 DIAGNOSIS — R531 Weakness: Secondary | ICD-10-CM | POA: Diagnosis not present

## 2018-08-04 DIAGNOSIS — Z9181 History of falling: Secondary | ICD-10-CM | POA: Diagnosis not present

## 2018-08-04 DIAGNOSIS — M6281 Muscle weakness (generalized): Secondary | ICD-10-CM | POA: Diagnosis not present

## 2018-08-04 DIAGNOSIS — M545 Low back pain: Secondary | ICD-10-CM | POA: Diagnosis not present

## 2018-08-04 DIAGNOSIS — R4189 Other symptoms and signs involving cognitive functions and awareness: Secondary | ICD-10-CM | POA: Diagnosis not present

## 2018-08-05 DIAGNOSIS — Z9181 History of falling: Secondary | ICD-10-CM | POA: Diagnosis not present

## 2018-08-05 DIAGNOSIS — R2681 Unsteadiness on feet: Secondary | ICD-10-CM | POA: Diagnosis not present

## 2018-08-05 DIAGNOSIS — M545 Low back pain: Secondary | ICD-10-CM | POA: Diagnosis not present

## 2018-08-05 DIAGNOSIS — M6281 Muscle weakness (generalized): Secondary | ICD-10-CM | POA: Diagnosis not present

## 2018-08-05 DIAGNOSIS — R531 Weakness: Secondary | ICD-10-CM | POA: Diagnosis not present

## 2018-08-05 DIAGNOSIS — R4189 Other symptoms and signs involving cognitive functions and awareness: Secondary | ICD-10-CM | POA: Diagnosis not present

## 2018-08-06 DIAGNOSIS — M6281 Muscle weakness (generalized): Secondary | ICD-10-CM | POA: Diagnosis not present

## 2018-08-06 DIAGNOSIS — R2681 Unsteadiness on feet: Secondary | ICD-10-CM | POA: Diagnosis not present

## 2018-08-06 DIAGNOSIS — R531 Weakness: Secondary | ICD-10-CM | POA: Diagnosis not present

## 2018-08-06 DIAGNOSIS — R4189 Other symptoms and signs involving cognitive functions and awareness: Secondary | ICD-10-CM | POA: Diagnosis not present

## 2018-08-06 DIAGNOSIS — M545 Low back pain: Secondary | ICD-10-CM | POA: Diagnosis not present

## 2018-08-06 DIAGNOSIS — Z9181 History of falling: Secondary | ICD-10-CM | POA: Diagnosis not present

## 2018-08-07 ENCOUNTER — Encounter: Payer: Self-pay | Admitting: Nurse Practitioner

## 2018-08-07 ENCOUNTER — Non-Acute Institutional Stay (SKILLED_NURSING_FACILITY): Payer: Medicare Other | Admitting: Nurse Practitioner

## 2018-08-07 DIAGNOSIS — J42 Unspecified chronic bronchitis: Secondary | ICD-10-CM

## 2018-08-07 DIAGNOSIS — K219 Gastro-esophageal reflux disease without esophagitis: Secondary | ICD-10-CM

## 2018-08-07 DIAGNOSIS — E871 Hypo-osmolality and hyponatremia: Secondary | ICD-10-CM

## 2018-08-07 DIAGNOSIS — I10 Essential (primary) hypertension: Secondary | ICD-10-CM | POA: Diagnosis not present

## 2018-08-07 DIAGNOSIS — F0150 Vascular dementia without behavioral disturbance: Secondary | ICD-10-CM

## 2018-08-07 DIAGNOSIS — F418 Other specified anxiety disorders: Secondary | ICD-10-CM | POA: Diagnosis not present

## 2018-08-07 DIAGNOSIS — K5901 Slow transit constipation: Secondary | ICD-10-CM

## 2018-08-07 DIAGNOSIS — D509 Iron deficiency anemia, unspecified: Secondary | ICD-10-CM | POA: Diagnosis not present

## 2018-08-07 NOTE — Assessment & Plan Note (Signed)
Stable, continue prn daily MOM 11ml.

## 2018-08-07 NOTE — Assessment & Plan Note (Signed)
Blood pressure is controlled, continue Metoprolol 50mg  qam, 25mg  qpm.

## 2018-08-07 NOTE — Assessment & Plan Note (Signed)
Her mood is stable, continue Venlafaxine 75mg  qd, Lorazepam 0.125mg  qhs.

## 2018-08-07 NOTE — Assessment & Plan Note (Signed)
Stable, continue Omeprazole 10mg qd.  

## 2018-08-07 NOTE — Progress Notes (Signed)
Location:  [place] Room Number: 71 Place of Service:  SNF (31) Provider: Marlana Latus  NP  Paulett Kaufhold X, NP  Patient Care Team: Tynesia Harral X, NP as PCP - General (Internal Medicine) [name], MD as Referring Physician (Ophthalmology)  Extended Emergency Contact Information Primary Emergency Contact: Stangeland,James Address: [address]          [place], [place] [address] [name] of [place] Phone: [phone] Mobile Phone: [phone] Relation: Son Secondary Emergency Contact: [name] Address: [address]          [place], [place] [address] [name] of [place] Phone: [phone] Mobile Phone: [phone] Relation: Son  Code Status:  DNR Goals of care: Advanced Directive information Advanced Directives 08/07/2018  Does Patient Have a Medical Advance Directive? Yes  Type of Advance Directive Out of facility DNR (pink MOST or yellow form)  Does patient want to make changes to medical advance directive? No - Patient declined  Copy of [place] in Chart? -  Would patient like information on creating a medical advance directive? -  Pre-existing out of facility DNR order (yellow form or pink MOST form) Yellow form placed in chart (order not valid for inpatient use);Pink MOST form placed in chart (order not valid for inpatient use)     Chief Complaint  Patient presents with  . Medical Management of Chronic Issues    HPI:  Pt is a 85 y.o. female seen today for medical management of chronic diseases.     The patient has history of HTN, blood pressure is in control, on Metoprolol 50mg  qam, 25mg  qpm. Her mood is stable on Venlafaxine 75mg  qd, Lorazepam 0.125mg  hs. GERD stable on Omeprazole 10mg  qd. She resides in [facility] for safety and care assistance, on Memantine 10mg  bid. Constipation, stable on MOM 58ml daily prn. Chronic bronchitis, stable on Breo Ellipta daily. Anemia, last Hgb 12.6 03/18/18,  on Fe. Hyponatremia, last Na 132 03/18/18.    Past Medical History:  Diagnosis Date  . Anemia, iron deficiency    takes Ferrous Sulfate daily  . Depression    takes Effexor daily  . Gait disorder 04/21/2014  . GERD (gastroesophageal reflux disease)    takes Omeprazole daily  . Herpes ocular    history of-takes Acyclovir daily  . High cholesterol    takes Atorvastatin daily  . History of hiatal hernia   . Hypertension    takes Metoprolol daily  . Insomnia    takes Xanax nightly  . Osteoarthritis of knee    bilateral  . Sciatic pain    Past Surgical History:  Procedure Laterality Date  . ABDOMINAL HYSTERECTOMY  1995   partial  . CARPAL TUNNEL RELEASE Right 07/21/2007  . CARPAL TUNNEL RELEASE Left 09/24/2007  . COLONOSCOPY    . DESCEMETS STRIPPING AUTOMATED ENDOTHELIAL KERATOPLASTY Left 03/14/2011  . DESCEMETS STRIPPING AUTOMATED ENDOTHELIAL KERATOPLASTY Right 08/20/2012  . EYE SURGERY Bilateral    cataract surgery  . EYE SURGERY Left    corneal transplant  . HERNIA REPAIR    . HIP ARTHROPLASTY Left 06/13/2013   Procedure: ARTHROPLASTY BIPOLAR HIP; Injection left shoulder;  Surgeon: Johnny Bridge, MD;  Location: [facility];  Service: Orthopedics;  Laterality: Left;  . JOINT REPLACEMENT     bilateral knees, left knee  . KNEE ARTHROSCOPY Right 05/11/2001  . KYPHOPLASTY N/A 04/10/2015   Procedure: T11 Kyphoplasty;  Surgeon: Jovita Gamma, MD;  Location: [facility];  Service:  Neurosurgery;  Laterality: N/A;  T11 Kyphoplasty  . KYPHOPLASTY N/A 05/06/2015   Procedure: KYPHOPLASTY Thoracic twelve;  Surgeon: Jovita Gamma, MD;  Location: [facility];  Service: Neurosurgery;  Laterality: N/A;  T12 Kyphoplasty  . LAPAROSCOPIC NISSEN FUNDOPLICATION  2/77/4128  . LUMBAR LAMINECTOMY/DECOMPRESSION MICRODISCECTOMY  08/29/2010   L2-S1  . SHOULDER ARTHROSCOPY WITH ROTATOR CUFF REPAIR AND SUBACROMIAL DECOMPRESSION Right 01/01/2000  . TONSILLECTOMY     as child  . TOTAL HIP REVISION Left  07/08/2014   Procedure: TOTAL HIP REVISION;  Surgeon: Kerin Salen, MD;  Location: [facility];  Service: Orthopedics;  Laterality: Left;  . TOTAL KNEE ARTHROPLASTY Left 05/14/2004  . TOTAL KNEE ARTHROPLASTY  12/23/2011   Procedure: TOTAL KNEE ARTHROPLASTY;  Surgeon: Lorn Junes, MD;  Location: [facility];  Service: Orthopedics;  Laterality: Right;  DR [place] THIS CASE  . TRIGGER FINGER RELEASE Right 07/21/2007   thumb  . TRIGGER FINGER RELEASE Left 09/24/2007   middle finger  . TRIGGER FINGER RELEASE Right 03/10/2008   ring and little fingers  . TRIGGER FINGER RELEASE Right 09/02/2012   Procedure: RELEASE TRIGGER FINGER/A-1 PULLEY RIGHT INDEX FINGER;  Surgeon: Wynonia Sours, MD;  Location: [facility];  Service: Orthopedics;  Laterality: Right;  . TRIGGER FINGER RELEASE Left 12/22/2012   Procedure: RELEASE TRIGGER FINGER/A-1 PULLEY LEFT RING FINGER;  Surgeon: Wynonia Sours, MD;  Location: [facility];  Service: Orthopedics;  Laterality: Left;  Left     Allergies  Allergen Reactions  . Morphine And Related Other (See Comments)    AGITATION, STRANGE DREAMS  . Scopolamine Other (See Comments)    MENTAL CHANGES  . Atorvastatin Other (See Comments)    unknown  . Morphine Other (See Comments)    Disoriented, mood changes  . Nsaids Other (See Comments)    unknown  . Pravachol [Pravastatin Sodium] Other (See Comments)    unknown  . Pravastatin Other (See Comments)    unknown  . Teriparatide Other (See Comments)    unknown    Outpatient Encounter Medications as of 08/07/2018  Medication Sig  . acetaminophen (TYLENOL) 500 MG tablet Take 500 mg by mouth every 6 (six) hours as needed for mild pain or moderate pain.  Marland Kitchen aspirin EC 81 MG tablet Take 81 mg by mouth. To be given every 5 days.  . Cholecalciferol (VITAMIN D) 2000 units CAPS Take 2,000 Units by mouth daily.  Marland Kitchen Dextran 70-Hypromellose (ARTIFICIAL TEARS) 0.1-0.3 % SOLN Apply 1 drop to eye 4 (four)  times daily.  Marland Kitchen docusate sodium (COLACE) 100 MG capsule Take 1 capsule by mouth daily as needed for mild constipation.   . feeding supplement (BOOST / RESOURCE BREEZE) LIQD Take 1 Container by mouth 2 (two) times daily. For weight loss   . ferrous sulfate 325 (65 FE) MG tablet Take 325 mg by mouth daily.  . fluticasone furoate-vilanterol (BREO ELLIPTA) 100-25 MCG/INH AEPB Inhale 1 puff into the lungs daily.  Marland Kitchen LORazepam (ATIVAN) 0.5 MG tablet Take 0.125 mg by mouth at bedtime. 1/4 tablet = 0.125 mg  . magnesium hydroxide (MILK OF MAGNESIA) 400 MG/5ML suspension Take 15 mLs by mouth at bedtime as needed for mild constipation.   . memantine (NAMENDA) 10 MG tablet Take 10 mg by mouth 2 (two) times daily.  . metoprolol tartrate (LOPRESSOR) 25 MG tablet Take 25 mg by mouth at bedtime.   . metoprolol tartrate (LOPRESSOR) 50 MG tablet Take 50 mg by  mouth every morning.  . nitroGLYCERIN (NITROSTAT) 0.4 MG SL tablet Place 0.4 mg under the tongue every 5 (five) minutes as needed for chest pain.  Marland Kitchen omeprazole (PRILOSEC) 10 MG capsule Take 10 mg by mouth daily.  Marland Kitchen venlafaxine XR (EFFEXOR-XR) 75 MG 24 hr capsule Take 75 mg by mouth daily.  Dema Severin Petrolatum-Mineral Oil (SYSTANE NIGHTTIME) OINT Apply to both eyes at bedtime  . [DISCONTINUED] Artificial Tear Solution (GENTEAL TEARS) 0.1-0.2-0.3 % SOLN Apply 1 drop to eye 4 (four) times daily.   . [DISCONTINUED] LORazepam (ATIVAN) 0.5 MG tablet Take 1 tablet (0.5 mg total) by mouth at bedtime. (Patient taking differently: Take 0.125 mg by mouth at bedtime. 1/4 = 0.125 mg)   No facility-administered encounter medications on file as of 08/07/2018.    ROS was provided with assistance of staff Review of Systems  Constitutional: Negative for activity change, appetite change, chills, diaphoresis, fatigue, fever and unexpected weight change.  HENT: Positive for hearing loss. Negative for congestion and voice change.   Respiratory: Positive for cough. Negative for  shortness of breath and wheezing.        Chronic hacking cough  Cardiovascular: Negative for chest pain, palpitations and leg swelling.  Gastrointestinal: Negative for abdominal distention, abdominal pain, constipation, diarrhea, nausea and vomiting.  Genitourinary: Negative for difficulty urinating, dysuria and urgency.  Musculoskeletal: Positive for arthralgias, back pain and gait problem.  Skin: Negative for color change and pallor.  Neurological: Negative for dizziness, speech difficulty, weakness and headaches.       Dementia  Psychiatric/Behavioral: Positive for confusion. Negative for agitation, behavioral problems, hallucinations and sleep disturbance. The patient is not nervous/anxious.     Immunization History  Administered Date(s) Administered  . Influenza Whole 03/27/2018  . Influenza-Unspecified 04/14/2017  . Pneumococcal Polysaccharide-23 06/14/2013   Pertinent  Health Maintenance Due  Topic Date Due  . PNA vac Low Risk Adult (2 of 2 - PCV13) 06/14/2014  . INFLUENZA VACCINE  Completed  . DEXA SCAN  Completed   Fall Risk  09/19/2017  Falls in the past year? No   Functional Status Survey:    Vitals:   08/07/18 1147  BP: 120/70  Pulse: 72  Resp: 20  Temp: 97.8 F (36.6 C)  SpO2: 95%  Weight: 119 lb 12.8 oz (54.3 kg)  Height: 5\' 1"  (1.549 m)   Body mass index is 22.64 kg/m. Physical Exam Constitutional:      General: She is not in acute distress.    Appearance: Normal appearance. She is normal weight. She is not ill-appearing, toxic-appearing or diaphoretic.  HENT:     Head: Normocephalic and atraumatic.     Nose: Nose normal. No congestion.     Mouth/Throat:     Mouth: Mucous membranes are moist.  Eyes:     Extraocular Movements: Extraocular movements intact.     Pupils: Pupils are equal, round, and reactive to light.  Cardiovascular:     Rate and Rhythm: Normal rate and regular rhythm.     Heart sounds: No murmur.  Pulmonary:     Effort: Pulmonary  effort is normal.     Breath sounds: No wheezing, rhonchi or rales.     Comments: Decreased air entry both lungs.  Abdominal:     General: There is no distension.     Palpations: Abdomen is soft.     Tenderness: There is no abdominal tenderness. There is no guarding or rebound.  Musculoskeletal:     Right lower leg: No edema.  Left lower leg: No edema.     Comments: W/c for mobility.   Skin:    General: Skin is warm and dry.  Neurological:     General: No focal deficit present.     Mental Status: She is alert. Mental status is at baseline.     Cranial Nerves: No cranial nerve deficit.     Motor: No weakness.     Coordination: Coordination normal.     Gait: Gait abnormal.     Comments: Oriented to person and place.      Labs reviewed: Recent Labs    11/13/17 11/18/17 03/18/18  NA 131* 131*  131* 132*  K 4.3 4.7  4.7 4.4  CL 98 99 97  CO2 24 26 28   BUN 12 17  17 12   CREATININE 0.6 0.6  0.6 0.6  CALCIUM 9.0 8.8 9.1   Recent Labs    11/04/17 11/13/17 11/18/17  AST 20 18 18  18   ALT 15 13 13  13   ALKPHOS 207* 158* 156*  156*  ALBUMIN 4.2 4.0 3.5   Recent Labs    10/30/17 11/13/17 03/18/18  WBC 5.3 5.4 8.7  HGB 11.6* 12.9 12.6  HCT 34* 38 37  PLT 363 369 308   Lab Results  Component Value Date   TSH 2.99 11/13/2017   Lab Results  Component Value Date   HGBA1C 5.8 06/29/2013   No results found for: CHOL, HDL, LDLCALC, LDLDIRECT, TRIG, CHOLHDL  Significant Diagnostic Results in last 30 days:  No results found.  Assessment/Plan Hypertension Blood pressure is controlled, continue Metoprolol 50mg  qam, 25mg  qpm.   Chronic bronchitis (HCC) Stable, continue Breo Ellipta daily.   GERD (gastroesophageal reflux disease) Stable, continue Omeprazole 10mg  qd.   Vascular dementia ([place]) Continue SNF [facility] for safety and care assistance. Continue Memantine 10mg  bid.   Constipation Stable, continue prn daily MOM 36ml.   Depression with anxiety Her mood  is stable, continue Venlafaxine 75mg  qd, Lorazepam 0.125mg  qhs.   Hyponatremia Last Na 132 03/18/18, update CMP  Iron deficiency anemia Stable, last Hgb 12.6 03/18/18, update CBC, continue Fe supplement.      Family/ staff Communication: plan of care reviewed with the patient and charge nurse.   Labs/tests ordered:  CBC CMP  Time spend 25 minutes.

## 2018-08-07 NOTE — Assessment & Plan Note (Signed)
Last Na 132 03/18/18, update CMP

## 2018-08-07 NOTE — Assessment & Plan Note (Addendum)
Stable, last Hgb 12.6 03/18/18, update CBC, continue Fe supplement.

## 2018-08-07 NOTE — Assessment & Plan Note (Signed)
Stable, continue Breo Ellipta daily.

## 2018-08-07 NOTE — Assessment & Plan Note (Signed)
Continue [facility] for safety and care assistance. Continue Memantine 10mg  bid.

## 2018-08-11 DIAGNOSIS — M6281 Muscle weakness (generalized): Secondary | ICD-10-CM | POA: Diagnosis not present

## 2018-08-11 DIAGNOSIS — M545 Low back pain: Secondary | ICD-10-CM | POA: Diagnosis not present

## 2018-08-11 DIAGNOSIS — R4189 Other symptoms and signs involving cognitive functions and awareness: Secondary | ICD-10-CM | POA: Diagnosis not present

## 2018-08-11 DIAGNOSIS — D509 Iron deficiency anemia, unspecified: Secondary | ICD-10-CM | POA: Diagnosis not present

## 2018-08-11 DIAGNOSIS — Z9181 History of falling: Secondary | ICD-10-CM | POA: Diagnosis not present

## 2018-08-11 DIAGNOSIS — R531 Weakness: Secondary | ICD-10-CM | POA: Diagnosis not present

## 2018-08-11 DIAGNOSIS — E871 Hypo-osmolality and hyponatremia: Secondary | ICD-10-CM | POA: Diagnosis not present

## 2018-08-11 DIAGNOSIS — R2681 Unsteadiness on feet: Secondary | ICD-10-CM | POA: Diagnosis not present

## 2018-08-11 LAB — CBC AND DIFFERENTIAL
HCT: 36 (ref 36–46)
Hemoglobin: 12.3 (ref 12.0–16.0)
Platelets: 282 (ref 150–399)
WBC: 5.7

## 2018-08-11 LAB — HEPATIC FUNCTION PANEL
ALT: 18 (ref 7–35)
AST: 21 (ref 13–35)
Alkaline Phosphatase: 82 (ref 25–125)
Bilirubin, Total: 0.3

## 2018-08-11 LAB — BASIC METABOLIC PANEL
BUN: 14 (ref 4–21)
Creatinine: 0.7 (ref 0.5–1.1)
Glucose: 96
Potassium: 5 (ref 3.4–5.3)
Sodium: 131 — AB (ref 137–147)

## 2018-08-13 DIAGNOSIS — R531 Weakness: Secondary | ICD-10-CM | POA: Diagnosis not present

## 2018-08-13 DIAGNOSIS — Z9181 History of falling: Secondary | ICD-10-CM | POA: Diagnosis not present

## 2018-08-13 DIAGNOSIS — R4189 Other symptoms and signs involving cognitive functions and awareness: Secondary | ICD-10-CM | POA: Diagnosis not present

## 2018-08-13 DIAGNOSIS — R2681 Unsteadiness on feet: Secondary | ICD-10-CM | POA: Diagnosis not present

## 2018-08-13 DIAGNOSIS — M6281 Muscle weakness (generalized): Secondary | ICD-10-CM | POA: Diagnosis not present

## 2018-08-13 DIAGNOSIS — M545 Low back pain: Secondary | ICD-10-CM | POA: Diagnosis not present

## 2018-08-14 DIAGNOSIS — M545 Low back pain: Secondary | ICD-10-CM | POA: Diagnosis not present

## 2018-08-14 DIAGNOSIS — M6281 Muscle weakness (generalized): Secondary | ICD-10-CM | POA: Diagnosis not present

## 2018-08-14 DIAGNOSIS — Z9181 History of falling: Secondary | ICD-10-CM | POA: Diagnosis not present

## 2018-08-14 DIAGNOSIS — R531 Weakness: Secondary | ICD-10-CM | POA: Diagnosis not present

## 2018-08-14 DIAGNOSIS — R4189 Other symptoms and signs involving cognitive functions and awareness: Secondary | ICD-10-CM | POA: Diagnosis not present

## 2018-08-14 DIAGNOSIS — R2681 Unsteadiness on feet: Secondary | ICD-10-CM | POA: Diagnosis not present

## 2018-08-17 ENCOUNTER — Other Ambulatory Visit: Payer: Self-pay | Admitting: *Deleted

## 2018-08-17 DIAGNOSIS — Z9181 History of falling: Secondary | ICD-10-CM | POA: Diagnosis not present

## 2018-08-17 DIAGNOSIS — R531 Weakness: Secondary | ICD-10-CM | POA: Diagnosis not present

## 2018-08-17 DIAGNOSIS — M6281 Muscle weakness (generalized): Secondary | ICD-10-CM | POA: Diagnosis not present

## 2018-08-17 DIAGNOSIS — R2681 Unsteadiness on feet: Secondary | ICD-10-CM | POA: Diagnosis not present

## 2018-08-17 DIAGNOSIS — M545 Low back pain: Secondary | ICD-10-CM | POA: Diagnosis not present

## 2018-08-17 DIAGNOSIS — R4189 Other symptoms and signs involving cognitive functions and awareness: Secondary | ICD-10-CM | POA: Diagnosis not present

## 2018-08-17 MED ORDER — LORAZEPAM 0.5 MG PO TABS: 0.5000 mg | ORAL_TABLET | Freq: Every day | ORAL | 0 refills | Status: DC

## 2018-08-18 DIAGNOSIS — R531 Weakness: Secondary | ICD-10-CM | POA: Diagnosis not present

## 2018-08-18 DIAGNOSIS — M6281 Muscle weakness (generalized): Secondary | ICD-10-CM | POA: Diagnosis not present

## 2018-08-18 DIAGNOSIS — R4189 Other symptoms and signs involving cognitive functions and awareness: Secondary | ICD-10-CM | POA: Diagnosis not present

## 2018-08-18 DIAGNOSIS — M545 Low back pain: Secondary | ICD-10-CM | POA: Diagnosis not present

## 2018-08-18 DIAGNOSIS — R2681 Unsteadiness on feet: Secondary | ICD-10-CM | POA: Diagnosis not present

## 2018-08-18 DIAGNOSIS — Z9181 History of falling: Secondary | ICD-10-CM | POA: Diagnosis not present

## 2018-08-19 DIAGNOSIS — R4189 Other symptoms and signs involving cognitive functions and awareness: Secondary | ICD-10-CM | POA: Diagnosis not present

## 2018-08-19 DIAGNOSIS — Z9181 History of falling: Secondary | ICD-10-CM | POA: Diagnosis not present

## 2018-08-19 DIAGNOSIS — M6281 Muscle weakness (generalized): Secondary | ICD-10-CM | POA: Diagnosis not present

## 2018-08-19 DIAGNOSIS — R531 Weakness: Secondary | ICD-10-CM | POA: Diagnosis not present

## 2018-08-19 DIAGNOSIS — R2681 Unsteadiness on feet: Secondary | ICD-10-CM | POA: Diagnosis not present

## 2018-08-19 DIAGNOSIS — M545 Low back pain: Secondary | ICD-10-CM | POA: Diagnosis not present

## 2018-08-25 DIAGNOSIS — Z9181 History of falling: Secondary | ICD-10-CM | POA: Diagnosis not present

## 2018-08-25 DIAGNOSIS — M545 Low back pain: Secondary | ICD-10-CM | POA: Diagnosis not present

## 2018-08-25 DIAGNOSIS — I10 Essential (primary) hypertension: Secondary | ICD-10-CM | POA: Diagnosis not present

## 2018-08-25 DIAGNOSIS — D509 Iron deficiency anemia, unspecified: Secondary | ICD-10-CM | POA: Diagnosis not present

## 2018-08-25 DIAGNOSIS — R531 Weakness: Secondary | ICD-10-CM | POA: Diagnosis not present

## 2018-08-25 DIAGNOSIS — J45901 Unspecified asthma with (acute) exacerbation: Secondary | ICD-10-CM | POA: Diagnosis not present

## 2018-08-25 DIAGNOSIS — R4189 Other symptoms and signs involving cognitive functions and awareness: Secondary | ICD-10-CM | POA: Diagnosis not present

## 2018-08-25 DIAGNOSIS — M6281 Muscle weakness (generalized): Secondary | ICD-10-CM | POA: Diagnosis not present

## 2018-08-25 DIAGNOSIS — R2681 Unsteadiness on feet: Secondary | ICD-10-CM | POA: Diagnosis not present

## 2018-08-25 DIAGNOSIS — K5900 Constipation, unspecified: Secondary | ICD-10-CM | POA: Diagnosis not present

## 2018-08-26 DIAGNOSIS — R531 Weakness: Secondary | ICD-10-CM | POA: Diagnosis not present

## 2018-08-26 DIAGNOSIS — M6281 Muscle weakness (generalized): Secondary | ICD-10-CM | POA: Diagnosis not present

## 2018-08-26 DIAGNOSIS — M545 Low back pain: Secondary | ICD-10-CM | POA: Diagnosis not present

## 2018-08-26 DIAGNOSIS — R2681 Unsteadiness on feet: Secondary | ICD-10-CM | POA: Diagnosis not present

## 2018-08-26 DIAGNOSIS — R4189 Other symptoms and signs involving cognitive functions and awareness: Secondary | ICD-10-CM | POA: Diagnosis not present

## 2018-08-26 DIAGNOSIS — Z9181 History of falling: Secondary | ICD-10-CM | POA: Diagnosis not present

## 2018-08-27 DIAGNOSIS — R2681 Unsteadiness on feet: Secondary | ICD-10-CM | POA: Diagnosis not present

## 2018-08-27 DIAGNOSIS — M545 Low back pain: Secondary | ICD-10-CM | POA: Diagnosis not present

## 2018-08-27 DIAGNOSIS — M6281 Muscle weakness (generalized): Secondary | ICD-10-CM | POA: Diagnosis not present

## 2018-08-27 DIAGNOSIS — Z9181 History of falling: Secondary | ICD-10-CM | POA: Diagnosis not present

## 2018-08-27 DIAGNOSIS — R4189 Other symptoms and signs involving cognitive functions and awareness: Secondary | ICD-10-CM | POA: Diagnosis not present

## 2018-08-27 DIAGNOSIS — R531 Weakness: Secondary | ICD-10-CM | POA: Diagnosis not present

## 2018-08-31 DIAGNOSIS — M545 Low back pain: Secondary | ICD-10-CM | POA: Diagnosis not present

## 2018-08-31 DIAGNOSIS — Z9181 History of falling: Secondary | ICD-10-CM | POA: Diagnosis not present

## 2018-08-31 DIAGNOSIS — M6281 Muscle weakness (generalized): Secondary | ICD-10-CM | POA: Diagnosis not present

## 2018-08-31 DIAGNOSIS — R2681 Unsteadiness on feet: Secondary | ICD-10-CM | POA: Diagnosis not present

## 2018-08-31 DIAGNOSIS — R531 Weakness: Secondary | ICD-10-CM | POA: Diagnosis not present

## 2018-08-31 DIAGNOSIS — R4189 Other symptoms and signs involving cognitive functions and awareness: Secondary | ICD-10-CM | POA: Diagnosis not present

## 2018-09-03 DIAGNOSIS — Z9181 History of falling: Secondary | ICD-10-CM | POA: Diagnosis not present

## 2018-09-03 DIAGNOSIS — R2681 Unsteadiness on feet: Secondary | ICD-10-CM | POA: Diagnosis not present

## 2018-09-03 DIAGNOSIS — M6281 Muscle weakness (generalized): Secondary | ICD-10-CM | POA: Diagnosis not present

## 2018-09-03 DIAGNOSIS — R531 Weakness: Secondary | ICD-10-CM | POA: Diagnosis not present

## 2018-09-03 DIAGNOSIS — R4189 Other symptoms and signs involving cognitive functions and awareness: Secondary | ICD-10-CM | POA: Diagnosis not present

## 2018-09-03 DIAGNOSIS — M545 Low back pain: Secondary | ICD-10-CM | POA: Diagnosis not present

## 2018-09-16 ENCOUNTER — Other Ambulatory Visit: Payer: Self-pay | Admitting: Nurse Practitioner

## 2018-09-16 MED ORDER — LORAZEPAM 0.5 MG PO TABS: 0.5000 mg | ORAL_TABLET | Freq: Every day | ORAL | 0 refills | Status: DC

## 2018-09-16 NOTE — Telephone Encounter (Signed)
Received fax refill request from [facility].  Request pended and sent to M. Mast, NP for approval and transmittal to [facility].

## 2018-10-15 ENCOUNTER — Other Ambulatory Visit: Payer: Self-pay | Admitting: *Deleted

## 2018-10-15 MED ORDER — LORAZEPAM 0.5 MG PO TABS: ORAL_TABLET | ORAL | 0 refills | Status: DC

## 2018-10-30 ENCOUNTER — Encounter: Payer: Self-pay | Admitting: Internal Medicine

## 2018-10-30 NOTE — Progress Notes (Signed)
A user error has taken place.

## 2018-11-10 ENCOUNTER — Other Ambulatory Visit: Payer: Self-pay

## 2018-11-10 DIAGNOSIS — R531 Weakness: Secondary | ICD-10-CM | POA: Diagnosis not present

## 2018-11-10 DIAGNOSIS — D509 Iron deficiency anemia, unspecified: Secondary | ICD-10-CM | POA: Diagnosis not present

## 2018-11-10 DIAGNOSIS — K5900 Constipation, unspecified: Secondary | ICD-10-CM | POA: Diagnosis not present

## 2018-11-10 DIAGNOSIS — J42 Unspecified chronic bronchitis: Secondary | ICD-10-CM | POA: Diagnosis not present

## 2018-11-10 DIAGNOSIS — R5383 Other fatigue: Secondary | ICD-10-CM | POA: Diagnosis not present

## 2018-11-10 DIAGNOSIS — R21 Rash and other nonspecific skin eruption: Secondary | ICD-10-CM | POA: Diagnosis not present

## 2018-11-10 DIAGNOSIS — H04123 Dry eye syndrome of bilateral lacrimal glands: Secondary | ICD-10-CM | POA: Diagnosis not present

## 2018-11-10 DIAGNOSIS — M6281 Muscle weakness (generalized): Secondary | ICD-10-CM | POA: Diagnosis not present

## 2018-11-10 DIAGNOSIS — F329 Major depressive disorder, single episode, unspecified: Secondary | ICD-10-CM | POA: Diagnosis not present

## 2018-11-10 DIAGNOSIS — M25552 Pain in left hip: Secondary | ICD-10-CM | POA: Diagnosis not present

## 2018-11-10 DIAGNOSIS — J45901 Unspecified asthma with (acute) exacerbation: Secondary | ICD-10-CM | POA: Diagnosis not present

## 2018-11-10 DIAGNOSIS — R4189 Other symptoms and signs involving cognitive functions and awareness: Secondary | ICD-10-CM | POA: Diagnosis not present

## 2018-11-10 DIAGNOSIS — F418 Other specified anxiety disorders: Secondary | ICD-10-CM | POA: Diagnosis not present

## 2018-11-10 DIAGNOSIS — I10 Essential (primary) hypertension: Secondary | ICD-10-CM | POA: Diagnosis not present

## 2018-11-10 DIAGNOSIS — N3080 Other cystitis without hematuria: Secondary | ICD-10-CM | POA: Diagnosis not present

## 2018-11-10 DIAGNOSIS — R2681 Unsteadiness on feet: Secondary | ICD-10-CM | POA: Diagnosis not present

## 2018-11-10 DIAGNOSIS — B965 Pseudomonas (aeruginosa) (mallei) (pseudomallei) as the cause of diseases classified elsewhere: Secondary | ICD-10-CM | POA: Diagnosis not present

## 2018-11-10 MED ORDER — LORAZEPAM 0.5 MG PO TABS: ORAL_TABLET | ORAL | 0 refills | Status: DC

## 2018-11-11 DIAGNOSIS — M6281 Muscle weakness (generalized): Secondary | ICD-10-CM | POA: Diagnosis not present

## 2018-11-11 DIAGNOSIS — R21 Rash and other nonspecific skin eruption: Secondary | ICD-10-CM | POA: Diagnosis not present

## 2018-11-11 DIAGNOSIS — F329 Major depressive disorder, single episode, unspecified: Secondary | ICD-10-CM | POA: Diagnosis not present

## 2018-11-11 DIAGNOSIS — F418 Other specified anxiety disorders: Secondary | ICD-10-CM | POA: Diagnosis not present

## 2018-11-11 DIAGNOSIS — M25552 Pain in left hip: Secondary | ICD-10-CM | POA: Diagnosis not present

## 2018-11-11 DIAGNOSIS — R2681 Unsteadiness on feet: Secondary | ICD-10-CM | POA: Diagnosis not present

## 2018-11-12 DIAGNOSIS — F418 Other specified anxiety disorders: Secondary | ICD-10-CM | POA: Diagnosis not present

## 2018-11-12 DIAGNOSIS — M6281 Muscle weakness (generalized): Secondary | ICD-10-CM | POA: Diagnosis not present

## 2018-11-12 DIAGNOSIS — R2681 Unsteadiness on feet: Secondary | ICD-10-CM | POA: Diagnosis not present

## 2018-11-12 DIAGNOSIS — F329 Major depressive disorder, single episode, unspecified: Secondary | ICD-10-CM | POA: Diagnosis not present

## 2018-11-12 DIAGNOSIS — R21 Rash and other nonspecific skin eruption: Secondary | ICD-10-CM | POA: Diagnosis not present

## 2018-11-12 DIAGNOSIS — M25552 Pain in left hip: Secondary | ICD-10-CM | POA: Diagnosis not present

## 2018-11-13 DIAGNOSIS — F418 Other specified anxiety disorders: Secondary | ICD-10-CM | POA: Diagnosis not present

## 2018-11-13 DIAGNOSIS — R2681 Unsteadiness on feet: Secondary | ICD-10-CM | POA: Diagnosis not present

## 2018-11-13 DIAGNOSIS — F329 Major depressive disorder, single episode, unspecified: Secondary | ICD-10-CM | POA: Diagnosis not present

## 2018-11-13 DIAGNOSIS — R21 Rash and other nonspecific skin eruption: Secondary | ICD-10-CM | POA: Diagnosis not present

## 2018-11-13 DIAGNOSIS — M6281 Muscle weakness (generalized): Secondary | ICD-10-CM | POA: Diagnosis not present

## 2018-11-13 DIAGNOSIS — M25552 Pain in left hip: Secondary | ICD-10-CM | POA: Diagnosis not present

## 2018-11-16 DIAGNOSIS — R21 Rash and other nonspecific skin eruption: Secondary | ICD-10-CM | POA: Diagnosis not present

## 2018-11-16 DIAGNOSIS — M6281 Muscle weakness (generalized): Secondary | ICD-10-CM | POA: Diagnosis not present

## 2018-11-16 DIAGNOSIS — R2681 Unsteadiness on feet: Secondary | ICD-10-CM | POA: Diagnosis not present

## 2018-11-16 DIAGNOSIS — F329 Major depressive disorder, single episode, unspecified: Secondary | ICD-10-CM | POA: Diagnosis not present

## 2018-11-16 DIAGNOSIS — M25552 Pain in left hip: Secondary | ICD-10-CM | POA: Diagnosis not present

## 2018-11-16 DIAGNOSIS — F418 Other specified anxiety disorders: Secondary | ICD-10-CM | POA: Diagnosis not present

## 2018-11-17 ENCOUNTER — Encounter: Payer: Self-pay | Admitting: Internal Medicine

## 2018-11-17 ENCOUNTER — Non-Acute Institutional Stay (SKILLED_NURSING_FACILITY): Payer: Medicare Other | Admitting: Internal Medicine

## 2018-11-17 DIAGNOSIS — R269 Unspecified abnormalities of gait and mobility: Secondary | ICD-10-CM | POA: Diagnosis not present

## 2018-11-17 DIAGNOSIS — E785 Hyperlipidemia, unspecified: Secondary | ICD-10-CM

## 2018-11-17 DIAGNOSIS — D509 Iron deficiency anemia, unspecified: Secondary | ICD-10-CM

## 2018-11-17 DIAGNOSIS — I10 Essential (primary) hypertension: Secondary | ICD-10-CM | POA: Diagnosis not present

## 2018-11-17 DIAGNOSIS — E871 Hypo-osmolality and hyponatremia: Secondary | ICD-10-CM

## 2018-11-17 DIAGNOSIS — F0150 Vascular dementia without behavioral disturbance: Secondary | ICD-10-CM

## 2018-11-17 NOTE — Progress Notes (Signed)
Location:  [facility] Room Number: 42/B Place of Service:  SNF (31) Provider:Madalene Mickler Rene Kocher,, MD   [name], MD  Patient Care Team: [name], MD as PCP - General (Internal Medicine) [name], MD as Referring Physician (Ophthalmology) Mast, Man X, NP as Nurse Practitioner (Internal Medicine)  Extended Emergency Contact Information Primary Emergency Contact: [name] Address: [address]          [place], [place] [address] [name] of [place] Phone: [phone] Mobile Phone: [phone] Relation: Son Secondary Emergency Contact: [name] Address: [address]          [place], [place] [address] [name] of [place] Phone: [phone] Mobile Phone: [phone] Relation: Son  Code Status: DNR  Goals of care: Advanced Directive information Advanced Directives 11/17/2018  Does Patient Have a Medical Advance Directive? Yes  Type of Advance Directive Out of facility DNR (pink MOST or yellow form)  Does patient want to make changes to medical advance directive? No - Guardian declined  Copy of [place] in Chart? -  Would patient like information on creating a medical advance directive? -  Pre-existing out of facility DNR order (yellow form or pink MOST form) Pink MOST form placed in chart (order not valid for inpatient use);Yellow form placed in chart (order not valid for inpatient use)     Chief Complaint  Patient presents with  . Acute Visit    MS changes     HPI:  Pt is a 85 y.o. female seen today for an acute visit for Depression and Anxiety Patient has a history of Hypertension, anemia, depression, Hyperlipidemia , COPD, hyponatremia, Depression with Anxiety and Dementia  Per nurses patient has been having problems with anxiety for last few weeks.  She has been calling her son to 3-4 times a day because she says she wants to go back to her apartment. She has also been  very aggressive towards the CNA's who take care of her. When I went to see her today she was very anxious she said that 2 years ago she fell and she was tricked into coming to this facility and she had to leave her apartment.  She thinks that now that she is strong she can go back.  She kept repeating herself and got very upset when I started asking her about the orientation questions. Per nurses patient has unstable Gait and Stays Risk for falls. She also need help with her ADLS  Past Medical History:  Diagnosis Date  . Anemia, iron deficiency    takes Ferrous Sulfate daily  . Depression    takes Effexor daily  . Gait disorder 04/21/2014  . GERD (gastroesophageal reflux disease)    takes Omeprazole daily  . Herpes ocular    history of-takes Acyclovir daily  . High cholesterol    takes Atorvastatin daily  . History of hiatal hernia   . Hypertension    takes Metoprolol daily  . Insomnia    takes Xanax nightly  . Osteoarthritis of knee    bilateral  . Sciatic pain    Past Surgical History:  Procedure Laterality Date  . ABDOMINAL HYSTERECTOMY  1995   partial  . CARPAL TUNNEL RELEASE Right 07/21/2007  . CARPAL TUNNEL RELEASE Left 09/24/2007  . COLONOSCOPY    . DESCEMETS STRIPPING AUTOMATED ENDOTHELIAL KERATOPLASTY Left 03/14/2011  . DESCEMETS STRIPPING AUTOMATED ENDOTHELIAL KERATOPLASTY Right 08/20/2012  . EYE SURGERY Bilateral    cataract surgery  .  EYE SURGERY Left    corneal transplant  . HERNIA REPAIR    . HIP ARTHROPLASTY Left 06/13/2013   Procedure: ARTHROPLASTY BIPOLAR HIP; Injection left shoulder;  Surgeon: Johnny Bridge, MD;  Location: [facility];  Service: Orthopedics;  Laterality: Left;  . JOINT REPLACEMENT     bilateral knees, left knee  . KNEE ARTHROSCOPY Right 05/11/2001  . KYPHOPLASTY N/A 04/10/2015   Procedure: T11 Kyphoplasty;  Surgeon: Jovita Gamma, MD;  Location: [facility];  Service: Neurosurgery;  Laterality: N/A;  T11 Kyphoplasty  . KYPHOPLASTY N/A  05/06/2015   Procedure: KYPHOPLASTY Thoracic twelve;  Surgeon: Jovita Gamma, MD;  Location: [facility];  Service: Neurosurgery;  Laterality: N/A;  T12 Kyphoplasty  . LAPAROSCOPIC NISSEN FUNDOPLICATION  0/24/0973  . LUMBAR LAMINECTOMY/DECOMPRESSION MICRODISCECTOMY  08/29/2010   L2-S1  . SHOULDER ARTHROSCOPY WITH ROTATOR CUFF REPAIR AND SUBACROMIAL DECOMPRESSION Right 01/01/2000  . TONSILLECTOMY     as child  . TOTAL HIP REVISION Left 07/08/2014   Procedure: TOTAL HIP REVISION;  Surgeon: Kerin Salen, MD;  Location: [facility];  Service: Orthopedics;  Laterality: Left;  . TOTAL KNEE ARTHROPLASTY Left 05/14/2004  . TOTAL KNEE ARTHROPLASTY  12/23/2011   Procedure: TOTAL KNEE ARTHROPLASTY;  Surgeon: Lorn Junes, MD;  Location: [facility];  Service: Orthopedics;  Laterality: Right;  DR [place] THIS CASE  . TRIGGER FINGER RELEASE Right 07/21/2007   thumb  . TRIGGER FINGER RELEASE Left 09/24/2007   middle finger  . TRIGGER FINGER RELEASE Right 03/10/2008   ring and little fingers  . TRIGGER FINGER RELEASE Right 09/02/2012   Procedure: RELEASE TRIGGER FINGER/A-1 PULLEY RIGHT INDEX FINGER;  Surgeon: Wynonia Sours, MD;  Location: [facility];  Service: Orthopedics;  Laterality: Right;  . TRIGGER FINGER RELEASE Left 12/22/2012   Procedure: RELEASE TRIGGER FINGER/A-1 PULLEY LEFT RING FINGER;  Surgeon: Wynonia Sours, MD;  Location: [facility];  Service: Orthopedics;  Laterality: Left;  Left     Allergies  Allergen Reactions  . Morphine And Related Other (See Comments)    AGITATION, STRANGE DREAMS  . Scopolamine Other (See Comments)    MENTAL CHANGES  . Atorvastatin Other (See Comments)    unknown  . Morphine Other (See Comments)    Disoriented, mood changes  . Nsaids Other (See Comments)    unknown  . Pravachol [Pravastatin Sodium] Other (See Comments)    unknown  . Pravastatin Other (See Comments)    unknown  . Teriparatide Other (See Comments)     unknown    Outpatient Encounter Medications as of 11/17/2018  Medication Sig  . acetaminophen (TYLENOL) 500 MG tablet Take 500 mg by mouth every 6 (six) hours as needed for mild pain or moderate pain. Non aspirin  . aspirin EC 81 MG tablet Take 81 mg by mouth. To be given every 5 days.  . Cholecalciferol (VITAMIN D) 2000 units CAPS Take 2,000 Units by mouth daily.  Marland Kitchen docusate sodium (COLACE) 100 MG capsule Take 1 capsule by mouth daily as needed for mild constipation.   . feeding supplement (BOOST / RESOURCE BREEZE) LIQD Take 1 Container by mouth 2 (two) times daily. For weight loss   . ferrous sulfate 325 (65 FE) MG tablet Take 325 mg by mouth daily.  . fluticasone furoate-vilanterol (BREO ELLIPTA) 100-25 MCG/INH AEPB Inhale 1 puff into the lungs daily.  Marland Kitchen LORazepam (ATIVAN) 0.5 MG tablet Take 1/4 tablet by mouth at bedtime (Patient taking differently:  0.125 mg. Take 1/4 tablet by mouth at bedtime)  . magnesium hydroxide (MILK OF MAGNESIA) 400 MG/5ML suspension Take 15 mLs by mouth at bedtime as needed for mild constipation.   . memantine (NAMENDA) 10 MG tablet Take 10 mg by mouth 2 (two) times daily.  . metoprolol tartrate (LOPRESSOR) 25 MG tablet Take 25 mg by mouth at bedtime.   . metoprolol tartrate (LOPRESSOR) 50 MG tablet Take 50 mg by mouth every morning.  . nitroGLYCERIN (NITROSTAT) 0.4 MG SL tablet Place 0.4 mg under the tongue every 5 (five) minutes as needed for chest pain.  Marland Kitchen omeprazole (PRILOSEC) 10 MG capsule Take 10 mg by mouth daily.  Vladimir Faster Glycol-Propyl Glycol 0.4-0.3 % SOLN Apply 1 drop to eye 4 (four) times daily.  Marland Kitchen venlafaxine XR (EFFEXOR-XR) 75 MG 24 hr capsule Take 75 mg by mouth daily.  Dema Severin Petrolatum-Mineral Oil (SYSTANE NIGHTTIME) OINT Apply to both eyes at bedtime  . [DISCONTINUED] Dextran 70-Hypromellose (ARTIFICIAL TEARS) 0.1-0.3 % SOLN Apply 1 drop to eye 4 (four) times daily.   No facility-administered encounter medications on file as of 11/17/2018.      Review of Systems  Review of Systems  Constitutional: Negative for activity change, appetite change, chills, diaphoresis, fatigue and fever.  HENT: Negative for mouth sores, postnasal drip, rhinorrhea, sinus pain and sore throat.   Respiratory: Negative for apnea, cough, chest tightness, shortness of breath and wheezing.   Cardiovascular: Negative for chest pain, palpitations and leg swelling.  Gastrointestinal: Negative for abdominal distention, abdominal pain, constipation, diarrhea, nausea and vomiting.  Genitourinary: Negative for dysuria and frequency.  Musculoskeletal: Negative for arthralgias, joint swelling and myalgias.  Skin: Negative for rash.  Neurological: Negative for dizziness, syncope, weakness, light-headedness and numbness.  Psychiatric/Behavioral: Negative for behavioral problems, confusion and sleep disturbance.     Immunization History  Administered Date(s) Administered  . Influenza Whole 03/27/2018  . Influenza-Unspecified 04/14/2017  . Pneumococcal Conjugate-13 09/24/2017  . Pneumococcal Polysaccharide-23 06/14/2013  . Td 09/25/2017  . Varicella 06/29/2003   Pertinent  Health Maintenance Due  Topic Date Due  . PNA vac Low Risk Adult (2 of 2 - PCV13) 06/14/2014  . INFLUENZA VACCINE  01/23/2019  . DEXA SCAN  Completed   Fall Risk  09/19/2017  Falls in the past year? No   Functional Status Survey:    Vitals:   11/17/18 1253  BP: 126/66  Pulse: 76  Resp: 20  Temp: 98.5 F (36.9 C)  SpO2: 93%  Weight: 118 lb 6.4 oz (53.7 kg)  Height: 5\' 1"  (1.549 m)   Body mass index is 22.37 kg/m. Physical Exam Vitals signs reviewed.  Constitutional:      Appearance: Normal appearance.  HENT:     Head: Normocephalic.     Nose: Nose normal.     Mouth/Throat:     Mouth: Mucous membranes are moist.     Pharynx: Oropharynx is clear.  Eyes:     Pupils: Pupils are equal, round, and reactive to light.  Neck:     Musculoskeletal: Neck supple.   Cardiovascular:     Rate and Rhythm: Normal rate and regular rhythm.     Pulses: Normal pulses.     Heart sounds: Normal heart sounds.  Pulmonary:     Effort: Pulmonary effort is normal. No respiratory distress.     Breath sounds: Normal breath sounds. No wheezing or rales.  Abdominal:     General: Abdomen is flat. Bowel sounds are normal.  Palpations: Abdomen is soft.  Musculoskeletal:        General: No swelling.  Skin:    General: Skin is warm and dry.  Neurological:     Mental Status: She is alert.     Comments: Patient knew the name of the p;ace and city She did not know her age or year or month Knew the president She was able to transfer herself from Wheelchair to [employer] raise her Left Hand due to Previous Fracture  Psychiatric:        Attention and Perception: Attention normal.        Mood and Affect: Mood is anxious and depressed. Affect is angry.        Speech: Speech is rapid and pressured.        Behavior: Behavior is agitated.        Thought Content: Thought content is delusional.     Labs reviewed: Recent Labs    11/18/17 03/18/18 08/11/18  NA 131*  131* 132* 131*  K 4.7  4.7 4.4 5.0  CL 99 97  --   CO2 26 28  --   BUN 17  17 12 14   CREATININE 0.6  0.6 0.6 0.7  CALCIUM 8.8 9.1  --    Recent Labs    11/18/17 08/11/18  AST 18  18 21   ALT 13  13 18   ALKPHOS 156*  156*  --   ALBUMIN 3.5  --    Recent Labs    03/18/18 08/11/18  WBC 8.7  --   HGB 12.6 12.3  HCT 37 36  PLT 308 282   Lab Results  Component Value Date   TSH 2.99 11/13/2017   Lab Results  Component Value Date   HGBA1C 5.8 06/29/2013   No results found for: CHOL, HDL, LDLCALC, LDLDIRECT, TRIG, CHOLHDL  Significant Diagnostic Results in last 30 days:  No results found.  Assessment/Plan Dementia with Depression and Anxiety Patient was transferred to SNF as she had number of falls in her apartment and worsening memory Her Effexor and Ativan were tapered in 01/20  Patient is now having Worsening of her symptoms Will increase her Effexor to 150 mg Increase her ativan to .5 mg QHS Will repeat Labs including CBC and CMP Continue Namenda Essential hypertension with Tachycardia On Lopressor GERD On Prilosec Gait disorder Working with therapy  Hyponatremia Repeat BMP Last  Sodium was 131 Iron deficiency anemia, On Iron Last Hgb was 12.3   Family/ staff Communication:   Labs/tests ordered:  CMP,CBC  Total time spent in this patient care encounter was 25 _  minutes; greater than 50% of the visit spent counseling patient and staff, reviewing records , Labs and coordinating care for problems addressed at this encounter.

## 2018-11-18 DIAGNOSIS — M25552 Pain in left hip: Secondary | ICD-10-CM | POA: Diagnosis not present

## 2018-11-18 DIAGNOSIS — F418 Other specified anxiety disorders: Secondary | ICD-10-CM | POA: Diagnosis not present

## 2018-11-18 DIAGNOSIS — F329 Major depressive disorder, single episode, unspecified: Secondary | ICD-10-CM | POA: Diagnosis not present

## 2018-11-18 DIAGNOSIS — M6281 Muscle weakness (generalized): Secondary | ICD-10-CM | POA: Diagnosis not present

## 2018-11-18 DIAGNOSIS — R21 Rash and other nonspecific skin eruption: Secondary | ICD-10-CM | POA: Diagnosis not present

## 2018-11-18 DIAGNOSIS — R2681 Unsteadiness on feet: Secondary | ICD-10-CM | POA: Diagnosis not present

## 2018-11-19 DIAGNOSIS — F418 Other specified anxiety disorders: Secondary | ICD-10-CM | POA: Diagnosis not present

## 2018-11-19 DIAGNOSIS — M25552 Pain in left hip: Secondary | ICD-10-CM | POA: Diagnosis not present

## 2018-11-19 DIAGNOSIS — M6281 Muscle weakness (generalized): Secondary | ICD-10-CM | POA: Diagnosis not present

## 2018-11-19 DIAGNOSIS — F329 Major depressive disorder, single episode, unspecified: Secondary | ICD-10-CM | POA: Diagnosis not present

## 2018-11-19 DIAGNOSIS — D509 Iron deficiency anemia, unspecified: Secondary | ICD-10-CM | POA: Diagnosis not present

## 2018-11-19 DIAGNOSIS — R21 Rash and other nonspecific skin eruption: Secondary | ICD-10-CM | POA: Diagnosis not present

## 2018-11-19 DIAGNOSIS — R2681 Unsteadiness on feet: Secondary | ICD-10-CM | POA: Diagnosis not present

## 2018-11-19 DIAGNOSIS — E871 Hypo-osmolality and hyponatremia: Secondary | ICD-10-CM | POA: Diagnosis not present

## 2018-11-19 LAB — BASIC METABOLIC PANEL
BUN: 12 (ref 4–21)
Creatinine: 0.7 (ref 0.5–1.1)
Glucose: 98
Potassium: 4.7 (ref 3.4–5.3)
Sodium: 134 — AB (ref 137–147)

## 2018-11-19 LAB — CBC AND DIFFERENTIAL
HCT: 37 (ref 36–46)
Hemoglobin: 12.4 (ref 12.0–16.0)
Platelets: 289 (ref 150–399)
WBC: 6.3

## 2018-11-19 LAB — HEPATIC FUNCTION PANEL
ALT: 30 (ref 7–35)
AST: 25 (ref 13–35)
Alkaline Phosphatase: 78 (ref 25–125)
Bilirubin, Total: 0.4

## 2018-11-24 DIAGNOSIS — R531 Weakness: Secondary | ICD-10-CM | POA: Diagnosis not present

## 2018-11-24 DIAGNOSIS — R21 Rash and other nonspecific skin eruption: Secondary | ICD-10-CM | POA: Diagnosis not present

## 2018-11-24 DIAGNOSIS — R2681 Unsteadiness on feet: Secondary | ICD-10-CM | POA: Diagnosis not present

## 2018-11-24 DIAGNOSIS — R5383 Other fatigue: Secondary | ICD-10-CM | POA: Diagnosis not present

## 2018-11-24 DIAGNOSIS — F418 Other specified anxiety disorders: Secondary | ICD-10-CM | POA: Diagnosis not present

## 2018-11-24 DIAGNOSIS — H04123 Dry eye syndrome of bilateral lacrimal glands: Secondary | ICD-10-CM | POA: Diagnosis not present

## 2018-11-24 DIAGNOSIS — R278 Other lack of coordination: Secondary | ICD-10-CM | POA: Diagnosis not present

## 2018-11-24 DIAGNOSIS — R4189 Other symptoms and signs involving cognitive functions and awareness: Secondary | ICD-10-CM | POA: Diagnosis not present

## 2018-11-24 DIAGNOSIS — M6281 Muscle weakness (generalized): Secondary | ICD-10-CM | POA: Diagnosis not present

## 2018-11-24 DIAGNOSIS — I10 Essential (primary) hypertension: Secondary | ICD-10-CM | POA: Diagnosis not present

## 2018-11-24 DIAGNOSIS — N3080 Other cystitis without hematuria: Secondary | ICD-10-CM | POA: Diagnosis not present

## 2018-11-24 DIAGNOSIS — D509 Iron deficiency anemia, unspecified: Secondary | ICD-10-CM | POA: Diagnosis not present

## 2018-11-24 DIAGNOSIS — F329 Major depressive disorder, single episode, unspecified: Secondary | ICD-10-CM | POA: Diagnosis not present

## 2018-11-24 DIAGNOSIS — J42 Unspecified chronic bronchitis: Secondary | ICD-10-CM | POA: Diagnosis not present

## 2018-11-24 DIAGNOSIS — Z7389 Other problems related to life management difficulty: Secondary | ICD-10-CM | POA: Diagnosis not present

## 2018-11-24 DIAGNOSIS — G8911 Acute pain due to trauma: Secondary | ICD-10-CM | POA: Diagnosis not present

## 2018-11-24 DIAGNOSIS — K5900 Constipation, unspecified: Secondary | ICD-10-CM | POA: Diagnosis not present

## 2018-11-24 DIAGNOSIS — B965 Pseudomonas (aeruginosa) (mallei) (pseudomallei) as the cause of diseases classified elsewhere: Secondary | ICD-10-CM | POA: Diagnosis not present

## 2018-11-24 DIAGNOSIS — J45901 Unspecified asthma with (acute) exacerbation: Secondary | ICD-10-CM | POA: Diagnosis not present

## 2018-11-24 DIAGNOSIS — M25552 Pain in left hip: Secondary | ICD-10-CM | POA: Diagnosis not present

## 2018-11-24 DIAGNOSIS — Z9181 History of falling: Secondary | ICD-10-CM | POA: Diagnosis not present

## 2018-11-24 DIAGNOSIS — L853 Xerosis cutis: Secondary | ICD-10-CM | POA: Diagnosis not present

## 2018-11-25 DIAGNOSIS — M6281 Muscle weakness (generalized): Secondary | ICD-10-CM | POA: Diagnosis not present

## 2018-11-25 DIAGNOSIS — G8911 Acute pain due to trauma: Secondary | ICD-10-CM | POA: Diagnosis not present

## 2018-11-25 DIAGNOSIS — R2681 Unsteadiness on feet: Secondary | ICD-10-CM | POA: Diagnosis not present

## 2018-11-25 DIAGNOSIS — R278 Other lack of coordination: Secondary | ICD-10-CM | POA: Diagnosis not present

## 2018-11-25 DIAGNOSIS — Z9181 History of falling: Secondary | ICD-10-CM | POA: Diagnosis not present

## 2018-11-25 DIAGNOSIS — Z7389 Other problems related to life management difficulty: Secondary | ICD-10-CM | POA: Diagnosis not present

## 2018-11-26 DIAGNOSIS — Z7389 Other problems related to life management difficulty: Secondary | ICD-10-CM | POA: Diagnosis not present

## 2018-11-26 DIAGNOSIS — R2681 Unsteadiness on feet: Secondary | ICD-10-CM | POA: Diagnosis not present

## 2018-11-26 DIAGNOSIS — M6281 Muscle weakness (generalized): Secondary | ICD-10-CM | POA: Diagnosis not present

## 2018-11-26 DIAGNOSIS — G8911 Acute pain due to trauma: Secondary | ICD-10-CM | POA: Diagnosis not present

## 2018-11-26 DIAGNOSIS — R278 Other lack of coordination: Secondary | ICD-10-CM | POA: Diagnosis not present

## 2018-11-26 DIAGNOSIS — Z9181 History of falling: Secondary | ICD-10-CM | POA: Diagnosis not present

## 2018-11-30 DIAGNOSIS — Z9181 History of falling: Secondary | ICD-10-CM | POA: Diagnosis not present

## 2018-11-30 DIAGNOSIS — Z7389 Other problems related to life management difficulty: Secondary | ICD-10-CM | POA: Diagnosis not present

## 2018-11-30 DIAGNOSIS — R2681 Unsteadiness on feet: Secondary | ICD-10-CM | POA: Diagnosis not present

## 2018-11-30 DIAGNOSIS — M6281 Muscle weakness (generalized): Secondary | ICD-10-CM | POA: Diagnosis not present

## 2018-11-30 DIAGNOSIS — G8911 Acute pain due to trauma: Secondary | ICD-10-CM | POA: Diagnosis not present

## 2018-11-30 DIAGNOSIS — R278 Other lack of coordination: Secondary | ICD-10-CM | POA: Diagnosis not present

## 2018-12-02 ENCOUNTER — Non-Acute Institutional Stay (SKILLED_NURSING_FACILITY): Payer: Medicare Other | Admitting: Nurse Practitioner

## 2018-12-02 ENCOUNTER — Encounter: Payer: Self-pay | Admitting: Nurse Practitioner

## 2018-12-02 DIAGNOSIS — K219 Gastro-esophageal reflux disease without esophagitis: Secondary | ICD-10-CM | POA: Diagnosis not present

## 2018-12-02 DIAGNOSIS — R278 Other lack of coordination: Secondary | ICD-10-CM | POA: Diagnosis not present

## 2018-12-02 DIAGNOSIS — I10 Essential (primary) hypertension: Secondary | ICD-10-CM | POA: Diagnosis not present

## 2018-12-02 DIAGNOSIS — R2681 Unsteadiness on feet: Secondary | ICD-10-CM | POA: Diagnosis not present

## 2018-12-02 DIAGNOSIS — M6281 Muscle weakness (generalized): Secondary | ICD-10-CM | POA: Diagnosis not present

## 2018-12-02 DIAGNOSIS — F418 Other specified anxiety disorders: Secondary | ICD-10-CM

## 2018-12-02 DIAGNOSIS — F0150 Vascular dementia without behavioral disturbance: Secondary | ICD-10-CM

## 2018-12-02 DIAGNOSIS — K5901 Slow transit constipation: Secondary | ICD-10-CM

## 2018-12-02 DIAGNOSIS — G8911 Acute pain due to trauma: Secondary | ICD-10-CM | POA: Diagnosis not present

## 2018-12-02 DIAGNOSIS — D509 Iron deficiency anemia, unspecified: Secondary | ICD-10-CM | POA: Diagnosis not present

## 2018-12-02 DIAGNOSIS — Z9181 History of falling: Secondary | ICD-10-CM | POA: Diagnosis not present

## 2018-12-02 DIAGNOSIS — Z7389 Other problems related to life management difficulty: Secondary | ICD-10-CM | POA: Diagnosis not present

## 2018-12-02 NOTE — Assessment & Plan Note (Signed)
Blood pressure is controlled, continue Metoprolol 25mg  qd.

## 2018-12-02 NOTE — Assessment & Plan Note (Signed)
Continue SNF [facility] for safety and care assistance, continue Memantine 10mg  bid.

## 2018-12-02 NOTE — Assessment & Plan Note (Signed)
not well controlled, anxious at times, continue  Venlafaxine 150mg  qd, Lorazepam 0.25mg  qhs.

## 2018-12-02 NOTE — Assessment & Plan Note (Signed)
Stable, continue Fe 325mg  qd, last Hgb 12.4 11/19/18

## 2018-12-02 NOTE — Assessment & Plan Note (Signed)
Stable, continue Omeprazole 10mg qd.  

## 2018-12-02 NOTE — Progress Notes (Signed)
Location:  [facility] Room Number: 61 Place of Service:  SNF (31) Provider:  Man Otho Darner, NP   [name], MD  Patient Care Team: [name], MD as PCP - General (Internal Medicine) [name], MD as Referring Physician (Ophthalmology) Mast, Man X, NP as Nurse Practitioner (Internal Medicine)  Extended Emergency Contact Information Primary Emergency Contact: [name] Address: [address]          [place], [place] [address] [name] of [place] Phone: [phone] Mobile Phone: [phone] Relation: Son Secondary Emergency Contact: [name] Address: [address]          [place], [place] [address] [name] of [place] Phone: [phone] Mobile Phone: [phone] Relation: Son  Code Status:  DNR Goals of care: Advanced Directive information Advanced Directives 12/02/2018  Does Patient Have a Medical Advance Directive? Yes  Type of Advance Directive Out of facility DNR (pink MOST or yellow form)  Does patient want to make changes to medical advance directive? No - Patient declined  Copy of [place] in Chart? -  Would patient like information on creating a medical advance directive? -  Pre-existing out of facility DNR order (yellow form or pink MOST form) Yellow form placed in chart (order not valid for inpatient use);Pink MOST form placed in chart (order not valid for inpatient use)     Chief Complaint  Patient presents with  . Medical Management of Chronic Issues    Routine Visit     HPI:  Pt is a 85 y.o. female seen today for medical management of chronic diseases.    The patient has history of GERD, stable on Omeprazole 10mg  qd. Depression/anxiety, not well controlled, anxious at times, on Venlafaxine 150mg  qd, Lorazepam 0.25mg  qhs. Constipation, stable, on MOM prn, Colace 100mg  qd prn. HTN, blood pressure is in control, on Metoprolol 25mg  qd. She resides in [facility] for safety  and care assistance, on Memantine 10mg  bid or memory. Hx of anemia, on Fe 325mg  qd, last Hgb 12.4 11/19/18   Past Medical History:  Diagnosis Date  . Anemia, iron deficiency    takes Ferrous Sulfate daily  . Depression    takes Effexor daily  . Gait disorder 04/21/2014  . GERD (gastroesophageal reflux disease)    takes Omeprazole daily  . Herpes ocular    history of-takes Acyclovir daily  . High cholesterol    takes Atorvastatin daily  . History of hiatal hernia   . Hypertension    takes Metoprolol daily  . Insomnia    takes Xanax nightly  . Osteoarthritis of knee    bilateral  . Sciatic pain    Past Surgical History:  Procedure Laterality Date  . ABDOMINAL HYSTERECTOMY  1995   partial  . CARPAL TUNNEL RELEASE Right 07/21/2007  . CARPAL TUNNEL RELEASE Left 09/24/2007  . COLONOSCOPY    . DESCEMETS STRIPPING AUTOMATED ENDOTHELIAL KERATOPLASTY Left 03/14/2011  . DESCEMETS STRIPPING AUTOMATED ENDOTHELIAL KERATOPLASTY Right 08/20/2012  . EYE SURGERY Bilateral    cataract surgery  . EYE SURGERY Left    corneal transplant  . HERNIA REPAIR    . HIP ARTHROPLASTY Left 06/13/2013   Procedure: ARTHROPLASTY BIPOLAR HIP; Injection left shoulder;  Surgeon: Johnny Bridge, MD;  Location: [facility];  Service: Orthopedics;  Laterality: Left;  . JOINT REPLACEMENT     bilateral knees, left knee  . KNEE ARTHROSCOPY Right 05/11/2001  . KYPHOPLASTY N/A 04/10/2015   Procedure: T11 Kyphoplasty;  Surgeon: Jovita Gamma, MD;  Location: [facility] NEURO ORS;  Service: Neurosurgery;  Laterality: N/A;  T11 Kyphoplasty  . KYPHOPLASTY N/A 05/06/2015   Procedure: KYPHOPLASTY Thoracic twelve;  Surgeon: Jovita Gamma, MD;  Location: [facility] NEURO ORS;  Service: Neurosurgery;  Laterality: N/A;  T12 Kyphoplasty  . LAPAROSCOPIC NISSEN FUNDOPLICATION  2/59/5638  . LUMBAR LAMINECTOMY/DECOMPRESSION MICRODISCECTOMY  08/29/2010   L2-S1  . SHOULDER ARTHROSCOPY WITH ROTATOR CUFF REPAIR AND SUBACROMIAL DECOMPRESSION Right  01/01/2000  . TONSILLECTOMY     as child  . TOTAL HIP REVISION Left 07/08/2014   Procedure: TOTAL HIP REVISION;  Surgeon: Kerin Salen, MD;  Location: [facility];  Service: Orthopedics;  Laterality: Left;  . TOTAL KNEE ARTHROPLASTY Left 05/14/2004  . TOTAL KNEE ARTHROPLASTY  12/23/2011   Procedure: TOTAL KNEE ARTHROPLASTY;  Surgeon: Lorn Junes, MD;  Location: [facility];  Service: Orthopedics;  Laterality: Right;  DR [place] THIS CASE  . TRIGGER FINGER RELEASE Right 07/21/2007   thumb  . TRIGGER FINGER RELEASE Left 09/24/2007   middle finger  . TRIGGER FINGER RELEASE Right 03/10/2008   ring and little fingers  . TRIGGER FINGER RELEASE Right 09/02/2012   Procedure: RELEASE TRIGGER FINGER/A-1 PULLEY RIGHT INDEX FINGER;  Surgeon: Wynonia Sours, MD;  Location: [facility];  Service: Orthopedics;  Laterality: Right;  . TRIGGER FINGER RELEASE Left 12/22/2012   Procedure: RELEASE TRIGGER FINGER/A-1 PULLEY LEFT RING FINGER;  Surgeon: Wynonia Sours, MD;  Location: [facility];  Service: Orthopedics;  Laterality: Left;  Left     Allergies  Allergen Reactions  . Morphine And Related Other (See Comments)    AGITATION, STRANGE DREAMS  . Scopolamine Other (See Comments)    MENTAL CHANGES  . Atorvastatin Other (See Comments)    unknown  . Morphine Other (See Comments)    Disoriented, mood changes  . Nsaids Other (See Comments)    unknown  . Pravachol [Pravastatin Sodium] Other (See Comments)    unknown  . Pravastatin Other (See Comments)    unknown  . Teriparatide Other (See Comments)    unknown    Outpatient Encounter Medications as of 12/02/2018  Medication Sig  . acetaminophen (TYLENOL) 500 MG tablet Take 500 mg by mouth every 6 (six) hours as needed for mild pain or moderate pain. Also give 1 500 mg tab by mouth at bedtime   Non Aspirin NOT TO EXCEED 3,000 MG ACETAMINOPHEN IN 24 HRS (SCHEDULED + PRN DOSES).  Marland Kitchen aspirin EC 81 MG tablet Take 81 mg by mouth.  To be given every 5 days.  . Cholecalciferol (VITAMIN D) 2000 units CAPS Take 2,000 Units by mouth daily.  Marland Kitchen docusate sodium (COLACE) 100 MG capsule Take 1 capsule by mouth daily as needed for mild constipation.   . feeding supplement (BOOST / RESOURCE BREEZE) LIQD Take 1 Container by mouth 2 (two) times daily. For weight loss   . ferrous sulfate 325 (65 FE) MG tablet Take 325 mg by mouth daily.  . fluticasone furoate-vilanterol (BREO ELLIPTA) 100-25 MCG/INH AEPB Inhale 1 puff into the lungs daily.  Marland Kitchen LORazepam (ATIVAN) 0.5 MG tablet Take 0.25 mg by mouth at bedtime. 1/2 tab  . magnesium hydroxide (MILK OF MAGNESIA) 400 MG/5ML suspension Take 15 mLs by mouth at bedtime as needed for mild constipation.   . memantine (NAMENDA) 10 MG tablet Take 10 mg by mouth 2 (two) times daily.  . metoprolol tartrate (LOPRESSOR) 25 MG tablet Take 25 mg  by mouth at bedtime.   . nitroGLYCERIN (NITROSTAT) 0.4 MG SL tablet Place 0.4 mg under the tongue every 5 (five) minutes as needed for chest pain.  Marland Kitchen omeprazole (PRILOSEC) 10 MG capsule Take 10 mg by mouth daily.  Vladimir Faster Glycol-Propyl Glycol (SYSTANE ULTRA) 0.4-0.3 % SOLN Apply 1 drop to eye 4 (four) times daily.  Vladimir Faster Glycol-Propyl Glycol 0.4-0.3 % SOLN Apply 1 drop to eye 4 (four) times daily.  Marland Kitchen venlafaxine XR (EFFEXOR-XR) 150 MG 24 hr capsule Take 150 mg by mouth daily with breakfast.  . White Petrolatum-Mineral Oil (SYSTANE NIGHTTIME) OINT Apply to both eyes at bedtime  . [DISCONTINUED] LORazepam (ATIVAN) 0.5 MG tablet Take 1/4 tablet by mouth at bedtime (Patient taking differently: Take 0.25 mg by mouth at bedtime. 1/2 tab)  . [DISCONTINUED] metoprolol tartrate (LOPRESSOR) 50 MG tablet Take 50 mg by mouth every morning.  . [DISCONTINUED] venlafaxine XR (EFFEXOR-XR) 75 MG 24 hr capsule Take 75 mg by mouth daily.   No facility-administered encounter medications on file as of 12/02/2018.    ROS was provided with assistance of staff.  Review of  Systems  Constitutional: Positive for unexpected weight change. Negative for activity change, appetite change, chills, diaphoresis and fever.       #2Ibs weight loss In th past month, f/u dietitian.   HENT: Positive for hearing loss. Negative for congestion and voice change.   Eyes: Negative for visual disturbance.  Respiratory: Positive for cough. Negative for shortness of breath and wheezing.        Chronic occasional hacking cough.   Gastrointestinal: Negative for abdominal distention, abdominal pain, constipation, diarrhea, nausea and vomiting.  Genitourinary: Negative for difficulty urinating, dysuria and urgency.  Musculoskeletal: Positive for arthralgias and gait problem.  Skin: Negative for color change and pallor.  Neurological: Negative for dizziness, speech difficulty, weakness and headaches.       Dementia, w/c for mobility.   Psychiatric/Behavioral: Negative for agitation, behavioral problems, hallucinations and sleep disturbance. The patient is nervous/anxious.     Immunization History  Administered Date(s) Administered  . Influenza Whole 03/27/2018  . Influenza-Unspecified 04/14/2017  . Pneumococcal Conjugate-13 09/24/2017  . Pneumococcal Polysaccharide-23 06/14/2013  . Td 09/25/2017  . Varicella 06/29/2003   Pertinent  Health Maintenance Due  Topic Date Due  . INFLUENZA VACCINE  01/23/2019  . DEXA SCAN  Completed  . PNA vac Low Risk Adult  Completed   Fall Risk  09/19/2017  Falls in the past year? No   Functional Status Survey:    Vitals:   12/02/18 1014  BP: 122/70  Pulse: 78  Resp: 18  Temp: 98 F (36.7 C)  TempSrc: Oral  SpO2: 97%  Weight: 116 lb (52.6 kg)  Height: 5\' 1"  (1.549 m)   Body mass index is 21.92 kg/m. Physical Exam Vitals signs and nursing note reviewed.  Constitutional:      General: She is not in acute distress.    Appearance: Normal appearance. She is normal weight. She is not ill-appearing, toxic-appearing or diaphoretic.   HENT:     Head: Normocephalic and atraumatic.     Nose: Nose normal.     Mouth/Throat:     Mouth: Mucous membranes are moist.  Eyes:     Extraocular Movements: Extraocular movements intact.     Conjunctiva/sclera: Conjunctivae normal.     Pupils: Pupils are equal, round, and reactive to light.  Neck:     Musculoskeletal: Normal range of motion and neck supple.  Cardiovascular:  Rate and Rhythm: Normal rate and regular rhythm.     Heart sounds: No murmur.  Pulmonary:     Effort: Pulmonary effort is normal.     Breath sounds: No wheezing, rhonchi or rales.  Abdominal:     General: There is no distension.     Palpations: Abdomen is soft.     Tenderness: There is no abdominal tenderness. There is no right CVA tenderness, left CVA tenderness, guarding or rebound.  Musculoskeletal:     Right lower leg: No edema.     Left lower leg: No edema.     Comments: W/c to get around. Limited ROM of the left shoulder.   Skin:    General: Skin is warm and dry.  Neurological:     General: No focal deficit present.     Mental Status: She is alert. Mental status is at baseline.     Cranial Nerves: No cranial nerve deficit.     Motor: No weakness.     Coordination: Coordination normal.     Gait: Gait abnormal.     Comments: Oriented to person and her room on unit, followed simple directions.   Psychiatric:        Behavior: Behavior normal.     Comments: Appeared anxious.      Labs reviewed: Recent Labs    03/18/18 08/11/18 11/19/18  NA 132* 131* 134*  K 4.4 5.0 4.7  CL 97  --   --   CO2 28  --   --   BUN 12 14 12   CREATININE 0.6 0.7 0.7  CALCIUM 9.1  --   --    Recent Labs    08/11/18 11/19/18  AST 21 25  ALT 18 30  ALKPHOS 82 78   Recent Labs    03/18/18 08/11/18 11/19/18  WBC 8.7 5.7 6.3  HGB 12.6 12.3 12.4  HCT 37 36 37  PLT 308 282 289   Lab Results  Component Value Date   TSH 2.99 11/13/2017   Lab Results  Component Value Date   HGBA1C 5.8 06/29/2013     Significant Diagnostic Results in last 30 days:   Assessment/Plan GERD (gastroesophageal reflux disease)  Stable, continue  Omeprazole 10mg  qd.  Depression with anxiety not well controlled, anxious at times, continue  Venlafaxine 150mg  qd, Lorazepam 0.25mg  qhs.  Iron deficiency anemia Stable, continue Fe 325mg  qd, last Hgb 12.4 11/19/18   Vascular dementia ([place]) Continue SNF [facility] for safety and care assistance, continue Memantine 10mg  bid.   Slow transit constipation Stable, continue prn MOM and Colace 100mg  qd.   Hypertension Blood pressure is controlled, continue Metoprolol 25mg  qd.      Family/ staff Communication: plan of care reviewed with the patient and charge nurse.   Labs/tests ordered:  none  Time spend 25 minutes.

## 2018-12-02 NOTE — Assessment & Plan Note (Signed)
Stable, continue prn MOM and Colace 100mg  qd.

## 2018-12-03 DIAGNOSIS — G8911 Acute pain due to trauma: Secondary | ICD-10-CM | POA: Diagnosis not present

## 2018-12-03 DIAGNOSIS — Z9181 History of falling: Secondary | ICD-10-CM | POA: Diagnosis not present

## 2018-12-03 DIAGNOSIS — R278 Other lack of coordination: Secondary | ICD-10-CM | POA: Diagnosis not present

## 2018-12-03 DIAGNOSIS — R2681 Unsteadiness on feet: Secondary | ICD-10-CM | POA: Diagnosis not present

## 2018-12-03 DIAGNOSIS — M6281 Muscle weakness (generalized): Secondary | ICD-10-CM | POA: Diagnosis not present

## 2018-12-03 DIAGNOSIS — Z7389 Other problems related to life management difficulty: Secondary | ICD-10-CM | POA: Diagnosis not present

## 2018-12-07 DIAGNOSIS — G8911 Acute pain due to trauma: Secondary | ICD-10-CM | POA: Diagnosis not present

## 2018-12-07 DIAGNOSIS — M6281 Muscle weakness (generalized): Secondary | ICD-10-CM | POA: Diagnosis not present

## 2018-12-07 DIAGNOSIS — Z7389 Other problems related to life management difficulty: Secondary | ICD-10-CM | POA: Diagnosis not present

## 2018-12-07 DIAGNOSIS — R278 Other lack of coordination: Secondary | ICD-10-CM | POA: Diagnosis not present

## 2018-12-07 DIAGNOSIS — Z9181 History of falling: Secondary | ICD-10-CM | POA: Diagnosis not present

## 2018-12-07 DIAGNOSIS — R2681 Unsteadiness on feet: Secondary | ICD-10-CM | POA: Diagnosis not present

## 2018-12-08 DIAGNOSIS — R2681 Unsteadiness on feet: Secondary | ICD-10-CM | POA: Diagnosis not present

## 2018-12-08 DIAGNOSIS — M6281 Muscle weakness (generalized): Secondary | ICD-10-CM | POA: Diagnosis not present

## 2018-12-08 DIAGNOSIS — G8911 Acute pain due to trauma: Secondary | ICD-10-CM | POA: Diagnosis not present

## 2018-12-08 DIAGNOSIS — R278 Other lack of coordination: Secondary | ICD-10-CM | POA: Diagnosis not present

## 2018-12-08 DIAGNOSIS — Z9181 History of falling: Secondary | ICD-10-CM | POA: Diagnosis not present

## 2018-12-08 DIAGNOSIS — Z7389 Other problems related to life management difficulty: Secondary | ICD-10-CM | POA: Diagnosis not present

## 2018-12-10 ENCOUNTER — Other Ambulatory Visit: Payer: Self-pay | Admitting: *Deleted

## 2018-12-10 DIAGNOSIS — M6281 Muscle weakness (generalized): Secondary | ICD-10-CM | POA: Diagnosis not present

## 2018-12-10 DIAGNOSIS — G8911 Acute pain due to trauma: Secondary | ICD-10-CM | POA: Diagnosis not present

## 2018-12-10 DIAGNOSIS — R2681 Unsteadiness on feet: Secondary | ICD-10-CM | POA: Diagnosis not present

## 2018-12-10 DIAGNOSIS — R278 Other lack of coordination: Secondary | ICD-10-CM | POA: Diagnosis not present

## 2018-12-10 DIAGNOSIS — Z7389 Other problems related to life management difficulty: Secondary | ICD-10-CM | POA: Diagnosis not present

## 2018-12-10 DIAGNOSIS — Z9181 History of falling: Secondary | ICD-10-CM | POA: Diagnosis not present

## 2018-12-10 MED ORDER — LORAZEPAM 0.5 MG PO TABS: 0.2500 mg | ORAL_TABLET | Freq: Every day | ORAL | 0 refills | Status: DC

## 2018-12-10 NOTE — Telephone Encounter (Signed)
[facility] Fax Request Pended Rx and sent to [facility] for approval.  

## 2018-12-11 DIAGNOSIS — G8911 Acute pain due to trauma: Secondary | ICD-10-CM | POA: Diagnosis not present

## 2018-12-11 DIAGNOSIS — R278 Other lack of coordination: Secondary | ICD-10-CM | POA: Diagnosis not present

## 2018-12-11 DIAGNOSIS — Z9181 History of falling: Secondary | ICD-10-CM | POA: Diagnosis not present

## 2018-12-11 DIAGNOSIS — M6281 Muscle weakness (generalized): Secondary | ICD-10-CM | POA: Diagnosis not present

## 2018-12-11 DIAGNOSIS — R2681 Unsteadiness on feet: Secondary | ICD-10-CM | POA: Diagnosis not present

## 2018-12-11 DIAGNOSIS — Z7389 Other problems related to life management difficulty: Secondary | ICD-10-CM | POA: Diagnosis not present

## 2018-12-14 DIAGNOSIS — M6281 Muscle weakness (generalized): Secondary | ICD-10-CM | POA: Diagnosis not present

## 2018-12-14 DIAGNOSIS — Z7389 Other problems related to life management difficulty: Secondary | ICD-10-CM | POA: Diagnosis not present

## 2018-12-14 DIAGNOSIS — Z9181 History of falling: Secondary | ICD-10-CM | POA: Diagnosis not present

## 2018-12-14 DIAGNOSIS — G8911 Acute pain due to trauma: Secondary | ICD-10-CM | POA: Diagnosis not present

## 2018-12-14 DIAGNOSIS — R2681 Unsteadiness on feet: Secondary | ICD-10-CM | POA: Diagnosis not present

## 2018-12-14 DIAGNOSIS — R278 Other lack of coordination: Secondary | ICD-10-CM | POA: Diagnosis not present

## 2019-01-02 DIAGNOSIS — Z1159 Encounter for screening for other viral diseases: Secondary | ICD-10-CM | POA: Diagnosis not present

## 2019-01-06 ENCOUNTER — Non-Acute Institutional Stay (SKILLED_NURSING_FACILITY): Payer: Medicare Other | Admitting: Nurse Practitioner

## 2019-01-06 ENCOUNTER — Encounter: Payer: Self-pay | Admitting: Nurse Practitioner

## 2019-01-06 DIAGNOSIS — R000 Tachycardia, unspecified: Secondary | ICD-10-CM

## 2019-01-06 DIAGNOSIS — D509 Iron deficiency anemia, unspecified: Secondary | ICD-10-CM

## 2019-01-06 DIAGNOSIS — F418 Other specified anxiety disorders: Secondary | ICD-10-CM

## 2019-01-06 DIAGNOSIS — F0150 Vascular dementia without behavioral disturbance: Secondary | ICD-10-CM | POA: Diagnosis not present

## 2019-01-06 DIAGNOSIS — K219 Gastro-esophageal reflux disease without esophagitis: Secondary | ICD-10-CM | POA: Diagnosis not present

## 2019-01-06 NOTE — Progress Notes (Signed)
Location:   [facility] Room Number: 42/B Place of Service:  SNF (31) Provider: [facility] Ross Bender NP  [name], MD  Patient Care Team: [name], MD as PCP - General (Internal Medicine) [name], MD as Referring Physician (Ophthalmology) Alixis Harmon X, NP as Nurse Practitioner (Internal Medicine)  Extended Emergency Contact Information Primary Emergency Contact: [name] Address: [address]          [place], [place] [address] [name] of [place] Phone: [phone] Mobile Phone: [phone] Relation: Son Secondary Emergency Contact: [name] Address: [address]          [place], [place] [address] [name] of [place] Phone: [phone] Mobile Phone: [phone] Relation: Son  Code Status:  DNR Goals of care: Advanced Directive information Advanced Directives 01/06/2019  Does Patient Have a Medical Advance Directive? Yes  Type of Advance Directive Out of facility DNR (pink MOST or yellow form)  Does patient want to make changes to medical advance directive? No - Patient declined  Copy of [place] in Chart? -  Would patient like information on creating a medical advance directive? -  Pre-existing out of facility DNR order (yellow form or pink MOST form) Yellow form placed in chart (order not valid for inpatient use);Pink MOST form placed in chart (order not valid for inpatient use)     Chief Complaint  Patient presents with  . Medical Management of Chronic Issues    routine visit     HPI:  Pt is a 85 y.o. female seen today for medical management of chronic diseases.    The patient resides in SN[facility] for safety and care assistance, w/c for mobility, on Memantine 10mg  bid for memory. Her mood is manage with Venlafaxine 165m gqd, Lorazepam 0.25mg  qhs. GERD, stable on Omeprazole 10mg  qd. Tachycardia, managed with Metoprolol 25mg  qd, 50mg  qd, HR 90s at rest. Anemia, stable, on Fe 325mg  qd, last  Hgb 12.4 11/19/18   Past Medical History:  Diagnosis Date  . Anemia, iron deficiency    takes Ferrous Sulfate daily  . Depression    takes Effexor daily  . Gait disorder 04/21/2014  . GERD (gastroesophageal reflux disease)    takes Omeprazole daily  . Herpes ocular    history of-takes Acyclovir daily  . High cholesterol    takes Atorvastatin daily  . History of hiatal hernia   . Hypertension    takes Metoprolol daily  . Insomnia    takes Xanax nightly  . Osteoarthritis of knee    bilateral  . Sciatic pain    Past Surgical History:  Procedure Laterality Date  . ABDOMINAL HYSTERECTOMY  1995   partial  . CARPAL TUNNEL RELEASE Right 07/21/2007  . CARPAL TUNNEL RELEASE Left 09/24/2007  . COLONOSCOPY    . DESCEMETS STRIPPING AUTOMATED ENDOTHELIAL KERATOPLASTY Left 03/14/2011  . DESCEMETS STRIPPING AUTOMATED ENDOTHELIAL KERATOPLASTY Right 08/20/2012  . EYE SURGERY Bilateral    cataract surgery  . EYE SURGERY Left    corneal transplant  . HERNIA REPAIR    . HIP ARTHROPLASTY Left 06/13/2013   Procedure: ARTHROPLASTY BIPOLAR HIP; Injection left shoulder;  Surgeon: Johnny Bridge, MD;  Location: [facility];  Service: Orthopedics;  Laterality: Left;  . JOINT REPLACEMENT     bilateral knees, left knee  . KNEE ARTHROSCOPY Right 05/11/2001  . KYPHOPLASTY N/A 04/10/2015   Procedure: T11 Kyphoplasty;  Surgeon: Jovita Gamma, MD;  Location: [place];  Service: Neurosurgery;  Laterality:  N/A;  T11 Kyphoplasty  . KYPHOPLASTY N/A 05/06/2015   Procedure: KYPHOPLASTY Thoracic twelve;  Surgeon: Jovita Gamma, MD;  Location: [place] [facility];  Service: Neurosurgery;  Laterality: N/A;  T12 Kyphoplasty  . LAPAROSCOPIC NISSEN FUNDOPLICATION  1/61/0960  . LUMBAR LAMINECTOMY/DECOMPRESSION MICRODISCECTOMY  08/29/2010   L2-S1  . SHOULDER ARTHROSCOPY WITH ROTATOR CUFF REPAIR AND SUBACROMIAL DECOMPRESSION Right 01/01/2000  . TONSILLECTOMY     as child  . TOTAL HIP REVISION Left 07/08/2014   Procedure:  TOTAL HIP REVISION;  Surgeon: Kerin Salen, MD;  Location: [facility];  Service: Orthopedics;  Laterality: Left;  . TOTAL KNEE ARTHROPLASTY Left 05/14/2004  . TOTAL KNEE ARTHROPLASTY  12/23/2011   Procedure: TOTAL KNEE ARTHROPLASTY;  Surgeon: Lorn Junes, MD;  Location: [facility];  Service: Orthopedics;  Laterality: Right;  [place] THIS CASE  . TRIGGER FINGER RELEASE Right 07/21/2007   thumb  . TRIGGER FINGER RELEASE Left 09/24/2007   middle finger  . TRIGGER FINGER RELEASE Right 03/10/2008   ring and little fingers  . TRIGGER FINGER RELEASE Right 09/02/2012   Procedure: RELEASE TRIGGER FINGER/A-1 PULLEY RIGHT INDEX FINGER;  Surgeon: Wynonia Sours, MD;  Location: [facility];  Service: Orthopedics;  Laterality: Right;  . TRIGGER FINGER RELEASE Left 12/22/2012   Procedure: RELEASE TRIGGER FINGER/A-1 PULLEY LEFT RING FINGER;  Surgeon: Wynonia Sours, MD;  Location: [facility];  Service: Orthopedics;  Laterality: Left;  Left     Allergies  Allergen Reactions  . Morphine And Related Other (See Comments)    AGITATION, STRANGE DREAMS  . Scopolamine Other (See Comments)    MENTAL CHANGES  . Atorvastatin Other (See Comments)    unknown  . Morphine Other (See Comments)    Disoriented, mood changes  . Nsaids Other (See Comments)    unknown  . Pravachol [Pravastatin Sodium] Other (See Comments)    unknown  . Pravastatin Other (See Comments)    unknown  . Teriparatide Other (See Comments)    unknown    Allergies as of 01/06/2019      Reactions   Morphine And Related Other (See Comments)   AGITATION, STRANGE DREAMS   Scopolamine Other (See Comments)   MENTAL CHANGES   Atorvastatin Other (See Comments)   unknown   Morphine Other (See Comments)   Disoriented, mood changes   Nsaids Other (See Comments)   unknown   Pravachol [pravastatin Sodium] Other (See Comments)   unknown   Pravastatin Other (See Comments)   unknown   Teriparatide Other (See  Comments)   unknown      Medication List       Accurate as of January 06, 2019  4:37 PM. If you have any questions, ask your nurse or doctor.        acetaminophen 500 MG tablet Commonly known as: TYLENOL Take 500 mg by mouth every 6 (six) hours as needed for mild pain or moderate pain. Also give 1 500 mg tab by mouth at bedtime   Non Aspirin NOT TO EXCEED 3,000 MG ACETAMINOPHEN IN 24 HRS (SCHEDULED + PRN DOSES).   aspirin EC 81 MG tablet Take 81 mg by mouth. To be given every 5 days.   Breo Ellipta 100-25 MCG/INH Aepb Generic drug: fluticasone furoate-vilanterol Inhale 1 puff into the lungs daily.   docusate sodium 100 MG capsule Commonly known as: COLACE Take 1 capsule by mouth daily as needed for mild constipation.   feeding supplement Liqd  Commonly known as: BOOST / RESOURCE BREEZE Take 1 Container by mouth 2 (two) times daily. For weight loss   ferrous sulfate 325 (65 FE) MG tablet Take 325 mg by mouth daily.   LORazepam 0.5 MG tablet Commonly known as: ATIVAN Take 0.5 tablets (0.25 mg total) by mouth at bedtime.   magnesium hydroxide 400 MG/5ML suspension Commonly known as: MILK OF MAGNESIA Take 15 mLs by mouth at bedtime as needed for mild constipation.   memantine 10 MG tablet Commonly known as: NAMENDA Take 10 mg by mouth 2 (two) times daily.   metoprolol tartrate 25 MG tablet Commonly known as: LOPRESSOR Take 25 mg by mouth at bedtime.   METOPROLOL TARTRATE PO Take 50 mg by mouth daily.   nitroGLYCERIN 0.4 MG SL tablet Commonly known as: NITROSTAT Place 0.4 mg under the tongue every 5 (five) minutes as needed for chest pain.   omeprazole 10 MG capsule Commonly known as: PRILOSEC Take 10 mg by mouth daily.   Polyethyl Glycol-Propyl Glycol 0.4-0.3 % Soln Apply 1 drop to eye 4 (four) times daily.   Systane Nighttime Oint Apply to both eyes at bedtime   venlafaxine XR 150 MG 24 hr capsule Commonly known as: EFFEXOR-XR Take 150 mg by mouth daily  with breakfast.   Vitamin D 50 MCG (2000 UT) Caps Take 2,000 Units by mouth daily.      ROS was provided with assistance of staff Review of Systems  Constitutional: Positive for unexpected weight change. Negative for activity change, appetite change, chills, diaphoresis, fatigue and fever.       Same weight as 01/2018  HENT: Positive for hearing loss. Negative for congestion and voice change.   Eyes: Negative for visual disturbance.  Respiratory: Negative for cough, shortness of breath and wheezing.   Cardiovascular: Negative for chest pain, palpitations and leg swelling.  Gastrointestinal: Negative for abdominal distention, constipation, diarrhea, nausea and vomiting.  Genitourinary: Negative for difficulty urinating, dysuria and urgency.  Musculoskeletal: Positive for gait problem.  Skin: Negative for color change and pallor.  Neurological: Positive for weakness. Negative for dizziness, speech difficulty and headaches.       Dementia  Psychiatric/Behavioral: Positive for dysphoric mood. Negative for agitation, behavioral problems, hallucinations and sleep disturbance. The patient is not nervous/anxious.     Immunization History  Administered Date(s) Administered  . Influenza Whole 03/27/2018  . Influenza-Unspecified 04/14/2017  . Pneumococcal Conjugate-13 09/24/2017  . Pneumococcal Polysaccharide-23 06/14/2013  . Td 09/25/2017  . Varicella 06/29/2003   Pertinent  Health Maintenance Due  Topic Date Due  . INFLUENZA VACCINE  01/23/2019  . DEXA SCAN  Completed  . PNA vac Low Risk Adult  Completed   Fall Risk  09/19/2017  Falls in the past year? No   Functional Status Survey:    Vitals:   01/06/19 0839  BP: 120/70  Pulse: 72  Resp: 20  Temp: (!) 97.4 F (36.3 C)  SpO2: 96%  Weight: 114 lb 8 oz (51.9 kg)  Height: 5\' 1"  (1.549 m)   Body mass index is 21.63 kg/m. Physical Exam Vitals signs and nursing note reviewed.  Constitutional:      General: She is not in  acute distress.    Appearance: Normal appearance. She is normal weight. She is not toxic-appearing or diaphoretic.  HENT:     Head: Normocephalic and atraumatic.     Nose: Nose normal.     Mouth/Throat:     Mouth: Mucous membranes are moist.  Eyes:  Extraocular Movements: Extraocular movements intact.     Conjunctiva/sclera: Conjunctivae normal.     Pupils: Pupils are equal, round, and reactive to light.  Neck:     Musculoskeletal: Normal range of motion and neck supple.  Cardiovascular:     Rate and Rhythm: Normal rate and regular rhythm.     Heart sounds: No murmur.  Pulmonary:     Effort: Pulmonary effort is normal.     Breath sounds: No wheezing, rhonchi or rales.  Abdominal:     General: There is no distension.     Palpations: Abdomen is soft.     Tenderness: There is no abdominal tenderness. There is no right CVA tenderness, left CVA tenderness, guarding or rebound.  Musculoskeletal:     Right lower leg: No edema.     Left lower leg: No edema.     Comments: Left shoulder limited over head ROM. W/c for mobility.   Skin:    General: Skin is warm and dry.  Neurological:     General: No focal deficit present.     Mental Status: She is alert. Mental status is at baseline.     Cranial Nerves: No cranial nerve deficit.     Motor: No weakness.     Coordination: Coordination normal.     Gait: Gait abnormal.     Deep Tendon Reflexes: Reflexes normal.     Comments: Oriented to self  Psychiatric:        Mood and Affect: Mood normal.        Behavior: Behavior normal.     Labs reviewed: Recent Labs    03/18/18 08/11/18 11/19/18  NA 132* 131* 134*  K 4.4 5.0 4.7  CL 97  --   --   CO2 28  --   --   BUN 12 14 12   CREATININE 0.6 0.7 0.7  CALCIUM 9.1  --   --    Recent Labs    08/11/18 11/19/18  AST 21 25  ALT 18 30  ALKPHOS 82 78   Recent Labs    03/18/18 08/11/18 11/19/18  WBC 8.7 5.7 6.3  HGB 12.6 12.3 12.4  HCT 37 36 37  PLT 308 282 289   Lab Results   Component Value Date   TSH 2.99 11/13/2017   Lab Results  Component Value Date   HGBA1C 5.8 06/29/2013   No results found for: CHOL, HDL, LDLCALC, LDLDIRECT, TRIG, CHOLHDL  Significant Diagnostic Results in last 30 days:  No results found.  Assessment/Plan  Tachycardia HR in 90s at rest, denied chest pain/pressure or palpitation, continue Metoprolol 25mg  qd, 50mg  qd.   GERD (gastroesophageal reflux disease) Stable, continue Omeprazole 71m qd  Vascular dementia ([place]) Continue SNF [facility] for safety and care assistance, continue Memantine 10mg  bid for memory.    Depression with anxiety Her mood is stable, continue Venlafaxine 150mg  qd, Lorazepam 0.25mg  qhs.   Iron deficiency anemia Stable, continue Fe 325mg  qd, last Hgb 12.4 11/19/18   Family/ staff Communication: plan of care reviewed with the patient and charge nurse.   Labs/tests ordered: none  Time spend 25 minutes

## 2019-01-06 NOTE — Assessment & Plan Note (Signed)
Continue SNF [facility] for safety and care assistance, continue Memantine 10mg  bid for memory.

## 2019-01-06 NOTE — Assessment & Plan Note (Signed)
Stable, continue Fe 325mg  qd, last Hgb 12.4 11/19/18

## 2019-01-06 NOTE — Assessment & Plan Note (Signed)
HR in 90s at rest, denied chest pain/pressure or palpitation, continue Metoprolol 25mg  qd, 50mg  qd.

## 2019-01-06 NOTE — Assessment & Plan Note (Signed)
Her mood is stable, continue Venlafaxine 150mg  qd, Lorazepam 0.25mg  qhs.

## 2019-01-06 NOTE — Assessment & Plan Note (Signed)
Stable, continue Omeprazole 47m qd

## 2019-01-07 ENCOUNTER — Other Ambulatory Visit: Payer: Self-pay | Admitting: *Deleted

## 2019-01-07 MED ORDER — LORAZEPAM 0.5 MG PO TABS: 0.2500 mg | ORAL_TABLET | Freq: Every day | ORAL | 0 refills | Status: DC

## 2019-01-07 NOTE — Telephone Encounter (Signed)
Received fax refill order from [place] and sent to [facility] for approval.

## 2019-01-20 DIAGNOSIS — Z03818 Encounter for observation for suspected exposure to other biological agents ruled out: Secondary | ICD-10-CM | POA: Diagnosis not present

## 2019-01-27 DIAGNOSIS — Z1159 Encounter for screening for other viral diseases: Secondary | ICD-10-CM | POA: Diagnosis not present

## 2019-02-02 ENCOUNTER — Non-Acute Institutional Stay (SKILLED_NURSING_FACILITY): Payer: Medicare Other | Admitting: Internal Medicine

## 2019-02-02 ENCOUNTER — Encounter: Payer: Self-pay | Admitting: Internal Medicine

## 2019-02-02 DIAGNOSIS — J42 Unspecified chronic bronchitis: Secondary | ICD-10-CM | POA: Diagnosis not present

## 2019-02-02 DIAGNOSIS — R269 Unspecified abnormalities of gait and mobility: Secondary | ICD-10-CM | POA: Diagnosis not present

## 2019-02-02 DIAGNOSIS — I10 Essential (primary) hypertension: Secondary | ICD-10-CM | POA: Diagnosis not present

## 2019-02-02 DIAGNOSIS — E871 Hypo-osmolality and hyponatremia: Secondary | ICD-10-CM | POA: Diagnosis not present

## 2019-02-02 NOTE — Progress Notes (Signed)
Location:  [facility] Room Number: 17 Place of Service:  SNF (31) Provider:  L,MD   [name], MD  Patient Care Team: [name], MD as PCP - General (Internal Medicine) [name], MD as Referring Physician (Ophthalmology) Mast, Man X, NP as Nurse Practitioner (Internal Medicine)  Extended Emergency Contact Information Primary Emergency Contact: [name] Address: [address]          [place], [place] [address] [name] of [place] Phone: [phone] Mobile Phone: [phone] Relation: Son Secondary Emergency Contact: [name] Address: [address]          [place], [place] [address] [name] of [place] Phone: [phone] Mobile Phone: [phone] Relation: Son  Code Status: DNR  Goals of care: Advanced Directive information Advanced Directives 02/02/2019  Does Patient Have a Medical Advance Directive? Yes  Type of Advance Directive Out of facility DNR (pink MOST or yellow form)  Does patient want to make changes to medical advance directive? No - Patient declined  Copy of [place] in Chart? -  Would patient like information on creating a medical advance directive? -  Pre-existing out of facility DNR order (yellow form or pink MOST form) Yellow form placed in chart (order not valid for inpatient use);Pink MOST form placed in chart (order not valid for inpatient use)     Chief Complaint  Patient presents with  . Medical Management of Chronic Issues    routine visit     HPI:  Pt is a 85 y.o. female seen today for medical management of chronic diseases.   Patient has a history of Hypertension, anemia, depression, Hyperlipidemia , COPD, hyponatremia, Depression with Anxiety and Dementia Patient is a long-term resident of facility. She does not have any acute complaints.  No new nursing issues.  She has lost some weight but overall is doing well in the facility.   Mostly wheelchair dependent and does not walk without assist. Has not had any falls recently Patient's main complaint was that she was treated to come here and she is independent enough to go back to her apartment.  She gets very anxious and upset.  Though her mood has been better  on high-dose of Effexor and Ativan  Past Medical History:  Diagnosis Date  . Anemia, iron deficiency    takes Ferrous Sulfate daily  . Depression    takes Effexor daily  . Gait disorder 04/21/2014  . GERD (gastroesophageal reflux disease)    takes Omeprazole daily  . Herpes ocular    history of-takes Acyclovir daily  . High cholesterol    takes Atorvastatin daily  . History of hiatal hernia   . Hypertension    takes Metoprolol daily  . Insomnia    takes Xanax nightly  . Osteoarthritis of knee    bilateral  . Sciatic pain    Past Surgical History:  Procedure Laterality Date  . ABDOMINAL HYSTERECTOMY  1995   partial  . CARPAL TUNNEL RELEASE Right 07/21/2007  . CARPAL TUNNEL RELEASE Left 09/24/2007  . COLONOSCOPY    . DESCEMETS STRIPPING AUTOMATED ENDOTHELIAL KERATOPLASTY Left 03/14/2011  . DESCEMETS STRIPPING AUTOMATED ENDOTHELIAL KERATOPLASTY Right 08/20/2012  . EYE SURGERY Bilateral    cataract surgery  . EYE SURGERY Left    corneal transplant  . HERNIA REPAIR    . HIP ARTHROPLASTY Left 06/13/2013   Procedure: ARTHROPLASTY BIPOLAR HIP; Injection left shoulder;  Surgeon: Johnny Bridge, MD;  Location: [facility];  Service: Orthopedics;  Laterality: Left;  . JOINT REPLACEMENT     bilateral knees, left knee  . KNEE ARTHROSCOPY Right 05/11/2001  . KYPHOPLASTY N/A 04/10/2015   Procedure: T11 Kyphoplasty;  Surgeon: Jovita Gamma, MD;  Location: [facility];  Service: Neurosurgery;  Laterality: N/A;  T11 Kyphoplasty  . KYPHOPLASTY N/A 05/06/2015   Procedure: KYPHOPLASTY Thoracic twelve;  Surgeon: Jovita Gamma, MD;  Location: [facility];  Service: Neurosurgery;  Laterality: N/A;  T12 Kyphoplasty  .  LAPAROSCOPIC NISSEN FUNDOPLICATION  9/41/7408  . LUMBAR LAMINECTOMY/DECOMPRESSION MICRODISCECTOMY  08/29/2010   L2-S1  . SHOULDER ARTHROSCOPY WITH ROTATOR CUFF REPAIR AND SUBACROMIAL DECOMPRESSION Right 01/01/2000  . TONSILLECTOMY     as child  . TOTAL HIP REVISION Left 07/08/2014   Procedure: TOTAL HIP REVISION;  Surgeon: Kerin Salen, MD;  Location: [facility];  Service: Orthopedics;  Laterality: Left;  . TOTAL KNEE ARTHROPLASTY Left 05/14/2004  . TOTAL KNEE ARTHROPLASTY  12/23/2011   Procedure: TOTAL KNEE ARTHROPLASTY;  Surgeon: Lorn Junes, MD;  Location: [facility];  Service: Orthopedics;  Laterality: Right;  DR [place] THIS CASE  . TRIGGER FINGER RELEASE Right 07/21/2007   thumb  . TRIGGER FINGER RELEASE Left 09/24/2007   middle finger  . TRIGGER FINGER RELEASE Right 03/10/2008   ring and little fingers  . TRIGGER FINGER RELEASE Right 09/02/2012   Procedure: RELEASE TRIGGER FINGER/A-1 PULLEY RIGHT INDEX FINGER;  Surgeon: Wynonia Sours, MD;  Location: [facility];  Service: Orthopedics;  Laterality: Right;  . TRIGGER FINGER RELEASE Left 12/22/2012   Procedure: RELEASE TRIGGER FINGER/A-1 PULLEY LEFT RING FINGER;  Surgeon: Wynonia Sours, MD;  Location: [facility];  Service: Orthopedics;  Laterality: Left;  Left     Allergies  Allergen Reactions  . Morphine And Related Other (See Comments)    AGITATION, STRANGE DREAMS  . Scopolamine Other (See Comments)    MENTAL CHANGES  . Atorvastatin Other (See Comments)    unknown  . Morphine Other (See Comments)    Disoriented, mood changes  . Nsaids Other (See Comments)    unknown  . Pravachol [Pravastatin Sodium] Other (See Comments)    unknown  . Pravastatin Other (See Comments)    unknown  . Teriparatide Other (See Comments)    unknown    Outpatient Encounter Medications as of 02/02/2019  Medication Sig  . acetaminophen (TYLENOL) 500 MG tablet Take 500 mg by mouth every 6 (six) hours as needed for  mild pain or moderate pain. Also give 1 500 mg tab by mouth at bedtime   Non Aspirin NOT TO EXCEED 3,000 MG ACETAMINOPHEN IN 24 HRS (SCHEDULED + PRN DOSES).  Marland Kitchen aspirin EC 81 MG tablet Take 81 mg by mouth. To be given every 5 days.  . Cholecalciferol (VITAMIN D) 2000 units CAPS Take 2,000 Units by mouth daily.  Marland Kitchen docusate sodium (COLACE) 100 MG capsule Take 1 capsule by mouth daily as needed for mild constipation.   . feeding supplement (BOOST / RESOURCE BREEZE) LIQD Take 1 Container by mouth 2 (two) times daily. For weight loss   . ferrous sulfate 325 (65 FE) MG tablet Take 325 mg by mouth daily.  . fluticasone furoate-vilanterol (BREO ELLIPTA) 100-25 MCG/INH AEPB Inhale 1 puff into the lungs daily.  Marland Kitchen LORazepam (ATIVAN) 0.5 MG tablet Take 0.5 tablets (0.25 mg total) by mouth at bedtime.  . magnesium hydroxide (MILK OF MAGNESIA) 400 MG/5ML suspension Take 15 mLs by mouth  at bedtime as needed for mild constipation.   . memantine (NAMENDA) 10 MG tablet Take 10 mg by mouth 2 (two) times daily.  . metoprolol tartrate (LOPRESSOR) 25 MG tablet Take 25 mg by mouth at bedtime.   Marland Kitchen METOPROLOL TARTRATE PO Take 50 mg by mouth daily.  . nitroGLYCERIN (NITROSTAT) 0.4 MG SL tablet Place 0.4 mg under the tongue every 5 (five) minutes as needed for chest pain.  Marland Kitchen omeprazole (PRILOSEC) 10 MG capsule Take 10 mg by mouth daily.  Vladimir Faster Glycol-Propyl Glycol 0.4-0.3 % SOLN Apply 1 drop to eye 4 (four) times daily.  Marland Kitchen venlafaxine XR (EFFEXOR-XR) 150 MG 24 hr capsule Take 150 mg by mouth daily with breakfast.  . White Petrolatum-Mineral Oil (SYSTANE NIGHTTIME) OINT Apply to both eyes at bedtime   No facility-administered encounter medications on file as of 02/02/2019.     Review of Systems  Review of Systems  Constitutional: Negative for activity change, appetite change, chills, diaphoresis, fatigue and fever.  HENT: Negative for mouth sores, postnasal drip, rhinorrhea, sinus pain and sore throat.    Respiratory: Negative for apnea, cough, chest tightness, shortness of breath and wheezing.   Cardiovascular: Negative for chest pain, palpitations and leg swelling.  Gastrointestinal: Negative for abdominal distention, abdominal pain, constipation, diarrhea, nausea and vomiting.  Genitourinary: Negative for dysuria and frequency.  Musculoskeletal: Negative for arthralgias, joint swelling and myalgias.  Skin: Negative for rash.  Neurological: Negative for dizziness, syncope, weakness, light-headedness and numbness.  Psychiatric/Behavioral: Negative for behavioral problems, confusion and sleep disturbance.     Immunization History  Administered Date(s) Administered  . Influenza Whole 03/27/2018  . Influenza-Unspecified 04/14/2017  . Pneumococcal Conjugate-13 09/24/2017  . Pneumococcal Polysaccharide-23 06/14/2013  . Td 09/25/2017  . Varicella 06/29/2003   Pertinent  Health Maintenance Due  Topic Date Due  . INFLUENZA VACCINE  01/23/2019  . DEXA SCAN  Completed  . PNA vac Low Risk Adult  Completed   Fall Risk  09/19/2017  Falls in the past year? No   Functional Status Survey:    Vitals:   02/02/19 1112  BP: 120/62  Pulse: 78  Resp: 20  Temp: (!) 97 F (36.1 C)  SpO2: 90%  Weight: 113 lb 12.8 oz (51.6 kg)  Height: 5\' 1"  (1.549 m)   Body mass index is 21.5 kg/m. Physical Exam  Constitutional: . Well-developed and well-nourished.  HENT:  Head: Normocephalic.  Mouth/Throat: Oropharynx is clear and moist.  Eyes: Pupils are equal, round, and reactive to light.  Neck: Neck supple.  Cardiovascular: Normal rate and normal heart sounds.  No murmur heard. Pulmonary/Chest: Effort normal and breath sounds normal. No respiratory distress. No wheezes. She has no rales.  Abdominal: Soft. Bowel sounds are normal. No distension. There is no tenderness. There is no rebound.  Musculoskeletal: No edema.  Lymphadenopathy: none Neurological: Has Frozen Left shoulder Good Strength in  all other Extremities Alert Follows Commands Wheelchair depenedet Skin: Skin is warm and dry.  Psychiatric: Normal mood and affect. Behavior is normal. Thought content normal.    Labs reviewed: Recent Labs    03/18/18 08/11/18 11/19/18  NA 132* 131* 134*  K 4.4 5.0 4.7  CL 97  --   --   CO2 28  --   --   BUN 12 14 12   CREATININE 0.6 0.7 0.7  CALCIUM 9.1  --   --    Recent Labs    08/11/18 11/19/18  AST 21 25  ALT 18 30  ALKPHOS 82 78   Recent Labs    03/18/18 08/11/18 11/19/18  WBC 8.7 5.7 6.3  HGB 12.6 12.3 12.4  HCT 37 36 37  PLT 308 282 289   Lab Results  Component Value Date   TSH 2.99 11/13/2017   Lab Results  Component Value Date   HGBA1C 5.8 06/29/2013   No results found for: CHOL, HDL, LDLCALC, LDLDIRECT, TRIG, CHOLHDL  Significant Diagnostic Results in last 30 days:  No results found.  Assessment/Plan Essential hypertension with Tachycardia Doing well on Lopressor GERD On Prilosec Gait disorder Works with therapy on PRN basis Hyponatremia Sodium 134 in Last BMP Iron deficiency anemia, Hgb 12.4 Patient does not like her Iron dose to be changed Dementia Continues needs had a level of skilled care On Namenda Depression Continue on Effexor and Ativan Previous GDR has failed H/o COPD On Breo Hyperlipidemia ? Allergic to Statin Family/ staff Communication:   Labs/tests ordered:  BMP and CBC  Total time spent in this patient care encounter was  25_  minutes; greater than 50% of the visit spent counseling patient and staff, reviewing records , Labs and coordinating care for problems addressed at this encounter.

## 2019-02-03 DIAGNOSIS — Z1159 Encounter for screening for other viral diseases: Secondary | ICD-10-CM | POA: Diagnosis not present

## 2019-02-04 DIAGNOSIS — D509 Iron deficiency anemia, unspecified: Secondary | ICD-10-CM | POA: Diagnosis not present

## 2019-02-04 LAB — BASIC METABOLIC PANEL
BUN: 15 (ref 4–21)
Creatinine: 0.6 (ref 0.5–1.1)
Glucose: 91
Potassium: 4.8 (ref 3.4–5.3)
Sodium: 132 — AB (ref 137–147)

## 2019-02-04 LAB — CBC AND DIFFERENTIAL
HCT: 37 (ref 36–46)
Hemoglobin: 12.6 (ref 12.0–16.0)
Platelets: 284 (ref 150–399)
WBC: 5.4

## 2019-02-11 ENCOUNTER — Other Ambulatory Visit: Payer: Self-pay

## 2019-02-11 MED ORDER — LORAZEPAM 0.5 MG PO TABS: 0.2500 mg | ORAL_TABLET | Freq: Every day | ORAL | 0 refills | Status: DC

## 2019-02-24 DIAGNOSIS — H0221C Cicatricial lagophthalmos, bilateral, upper and lower eyelids: Secondary | ICD-10-CM | POA: Diagnosis not present

## 2019-02-24 DIAGNOSIS — Z947 Corneal transplant status: Secondary | ICD-10-CM | POA: Diagnosis not present

## 2019-02-24 DIAGNOSIS — H40003 Preglaucoma, unspecified, bilateral: Secondary | ICD-10-CM | POA: Diagnosis not present

## 2019-02-24 DIAGNOSIS — H3530 Unspecified macular degeneration: Secondary | ICD-10-CM | POA: Diagnosis not present

## 2019-02-24 DIAGNOSIS — H04123 Dry eye syndrome of bilateral lacrimal glands: Secondary | ICD-10-CM | POA: Diagnosis not present

## 2019-02-24 DIAGNOSIS — Z961 Presence of intraocular lens: Secondary | ICD-10-CM | POA: Diagnosis not present

## 2019-03-03 ENCOUNTER — Encounter: Payer: Self-pay | Admitting: *Deleted

## 2019-03-03 ENCOUNTER — Encounter: Payer: Self-pay | Admitting: Nurse Practitioner

## 2019-03-03 LAB — BASIC METABOLIC PANEL
Calcium: 9.3
Carbon Dioxide, Total: 27
Chloride: 98
EGFR (Non-African Amer.): 82

## 2019-03-03 NOTE — Progress Notes (Signed)
Location:  [facility] Room Number: 9 Place of Service:  SNF (31) Provider:  Mast, Lennie Odor  NP  [name], MD  Patient Care Team: [name], MD as PCP - General (Internal Medicine) [name], MD as Referring Physician (Ophthalmology) Mast, Man X, NP as Nurse Practitioner (Internal Medicine)  Extended Emergency Contact Information Primary Emergency Contact: [name] Address: [address]          [place], [place] [address] [name] of [place] Phone: [phone] Mobile Phone: [phone] Relation: Son Secondary Emergency Contact: [name] Address: [address]          [place], [place] [address] [name] of [place] Phone: [phone] Mobile Phone: [phone] Relation: Son  Code Status:  DNR Goals of care: Advanced Directive information Advanced Directives 03/03/2019  Does Patient Have a Medical Advance Directive? Yes  Type of Advance Directive Out of facility DNR (pink MOST or yellow form)  Does patient want to make changes to medical advance directive? No - Patient declined  Copy of [place] in Chart? -  Would patient like information on creating a medical advance directive? -  Pre-existing out of facility DNR order (yellow form or pink MOST form) Yellow form placed in chart (order not valid for inpatient use)     Chief Complaint  Patient presents with  . Medical Management of Chronic Issues    HPI:  Pt is a 85 y.o. female seen today for medical management of chronic diseases.     Past Medical History:  Diagnosis Date  . Anemia, iron deficiency    takes Ferrous Sulfate daily  . Depression    takes Effexor daily  . Gait disorder 04/21/2014  . GERD (gastroesophageal reflux disease)    takes Omeprazole daily  . Herpes ocular    history of-takes Acyclovir daily  . High cholesterol    takes Atorvastatin daily  . History of hiatal hernia   . Hypertension    takes  Metoprolol daily  . Insomnia    takes Xanax nightly  . Osteoarthritis of knee    bilateral  . Sciatic pain    Past Surgical History:  Procedure Laterality Date  . ABDOMINAL HYSTERECTOMY  1995   partial  . CARPAL TUNNEL RELEASE Right 07/21/2007  . CARPAL TUNNEL RELEASE Left 09/24/2007  . COLONOSCOPY    . DESCEMETS STRIPPING AUTOMATED ENDOTHELIAL KERATOPLASTY Left 03/14/2011  . DESCEMETS STRIPPING AUTOMATED ENDOTHELIAL KERATOPLASTY Right 08/20/2012  . EYE SURGERY Bilateral    cataract surgery  . EYE SURGERY Left    corneal transplant  . HERNIA REPAIR    . HIP ARTHROPLASTY Left 06/13/2013   Procedure: ARTHROPLASTY BIPOLAR HIP; Injection left shoulder;  Surgeon: Johnny Bridge, MD;  Location: [facility];  Service: Orthopedics;  Laterality: Left;  . JOINT REPLACEMENT     bilateral knees, left knee  . KNEE ARTHROSCOPY Right 05/11/2001  . KYPHOPLASTY N/A 04/10/2015   Procedure: T11 Kyphoplasty;  Surgeon: Jovita Gamma, MD;  Location: [facility];  Service: Neurosurgery;  Laterality: N/A;  T11 Kyphoplasty  . KYPHOPLASTY N/A 05/06/2015   Procedure: KYPHOPLASTY Thoracic twelve;  Surgeon: Jovita Gamma, MD;  Location: [facility];  Service: Neurosurgery;  Laterality: N/A;  T12 Kyphoplasty  . LAPAROSCOPIC NISSEN FUNDOPLICATION  [id number]  . LUMBAR LAMINECTOMY/DECOMPRESSION MICRODISCECTOMY  08/29/2010   L2-S1  . SHOULDER ARTHROSCOPY WITH ROTATOR CUFF REPAIR AND SUBACROMIAL DECOMPRESSION Right 01/01/2000  . TONSILLECTOMY     as child  .  TOTAL HIP REVISION Left 07/08/2014   Procedure: TOTAL HIP REVISION;  Surgeon: Kerin Salen, MD;  Location: [facility];  Service: Orthopedics;  Laterality: Left;  . TOTAL KNEE ARTHROPLASTY Left 05/14/2004  . TOTAL KNEE ARTHROPLASTY  12/23/2011   Procedure: TOTAL KNEE ARTHROPLASTY;  Surgeon: Lorn Junes, MD;  Location: [facility];  Service: Orthopedics;  Laterality: Right;  DR [place] THIS CASE  . TRIGGER FINGER RELEASE Right 07/21/2007   thumb  .  TRIGGER FINGER RELEASE Left 09/24/2007   middle finger  . TRIGGER FINGER RELEASE Right 03/10/2008   ring and little fingers  . TRIGGER FINGER RELEASE Right 09/02/2012   Procedure: RELEASE TRIGGER FINGER/A-1 PULLEY RIGHT INDEX FINGER;  Surgeon: Wynonia Sours, MD;  Location: [facility];  Service: Orthopedics;  Laterality: Right;  . TRIGGER FINGER RELEASE Left 12/22/2012   Procedure: RELEASE TRIGGER FINGER/A-1 PULLEY LEFT RING FINGER;  Surgeon: Wynonia Sours, MD;  Location: [facility];  Service: Orthopedics;  Laterality: Left;  Left     Allergies  Allergen Reactions  . Morphine And Related Other (See Comments)    AGITATION, STRANGE DREAMS  . Scopolamine Other (See Comments)    MENTAL CHANGES  . Atorvastatin Other (See Comments)    unknown  . Morphine Other (See Comments)    Disoriented, mood changes  . Nsaids Other (See Comments)    unknown  . Pravachol [Pravastatin Sodium] Other (See Comments)    unknown  . Pravastatin Other (See Comments)    unknown  . Teriparatide Other (See Comments)    unknown    Outpatient Encounter Medications as of 03/03/2019  Medication Sig  . acetaminophen (TYLENOL) 500 MG tablet Take 500 mg by mouth every 6 (six) hours as needed for mild pain or moderate pain. Also give 1 500 mg tab by mouth at bedtime   Non Aspirin NOT TO EXCEED 3,000 MG ACETAMINOPHEN IN 24 HRS (SCHEDULED + PRN DOSES).  Marland Kitchen aspirin EC 81 MG tablet Take 81 mg by mouth. To be given every 5 days.  . Cholecalciferol (VITAMIN D) 2000 units CAPS Take 2,000 Units by mouth daily.  Marland Kitchen docusate sodium (COLACE) 100 MG capsule Take 1 capsule by mouth daily as needed for mild constipation.   . feeding supplement (BOOST / RESOURCE BREEZE) LIQD Take 1 Container by mouth 2 (two) times daily. For weight loss   . ferrous sulfate 325 (65 FE) MG tablet Take 325 mg by mouth daily.  . fluticasone furoate-vilanterol (BREO ELLIPTA) 100-25 MCG/INH AEPB Inhale 1 puff into the lungs daily.  .  Glycerin-Hypromellose-PEG 400 (ARTIFICIAL TEARS) 0.2-0.2-1 % SOLN Place 1 drop into both eyes 4 (four) times daily.  Marland Kitchen LORazepam (ATIVAN) 0.5 MG tablet Take 0.5 tablets (0.25 mg total) by mouth at bedtime.  . magnesium hydroxide (MILK OF MAGNESIA) 400 MG/5ML suspension Take 15 mLs by mouth at bedtime as needed for mild constipation.   . memantine (NAMENDA) 10 MG tablet Take 10 mg by mouth 2 (two) times daily.  . metoprolol tartrate (LOPRESSOR) 25 MG tablet Take 25 mg by mouth at bedtime.   Marland Kitchen METOPROLOL TARTRATE PO Take 50 mg by mouth daily.  . nitroGLYCERIN (NITROSTAT) 0.4 MG SL tablet Place 0.4 mg under the tongue every 5 (five) minutes as needed for chest pain.  Marland Kitchen omeprazole (PRILOSEC) 10 MG capsule Take 10 mg by mouth daily.  Marland Kitchen venlafaxine XR (EFFEXOR-XR) 150 MG 24 hr capsule Take 150 mg by mouth daily with  breakfast.  . White Petrolatum-Mineral Oil (SYSTANE NIGHTTIME) OINT Apply to both eyes at bedtime  . [DISCONTINUED] Polyethyl Glycol-Propyl Glycol 0.4-0.3 % SOLN Apply 1 drop to eye 4 (four) times daily.   No facility-administered encounter medications on file as of 03/03/2019.     Review of Systems  Immunization History  Administered Date(s) Administered  . Influenza Whole 03/27/2018  . Influenza-Unspecified 04/14/2017  . Pneumococcal Conjugate-13 09/24/2017  . Pneumococcal Polysaccharide-23 06/14/2013  . Td 09/25/2017  . Varicella 06/29/2003   Pertinent  Health Maintenance Due  Topic Date Due  . INFLUENZA VACCINE  01/23/2019  . DEXA SCAN  Completed  . PNA vac Low Risk Adult  Completed   Fall Risk  09/19/2017  Falls in the past year? No   Functional Status Survey:    Vitals:   03/03/19 1039  BP: 130/72  Pulse: 78  Resp: 20  Temp: (!) 96.9 F (36.1 C)  SpO2: 94%  Weight: 111 lb 8 oz (50.6 kg)  Height: 5\' 1"  (1.549 m)   Body mass index is 21.07 kg/m. Physical Exam  Labs reviewed: Recent Labs    03/18/18 08/11/18 11/19/18 02/04/19  NA 132* 131* 134* 132*  K  4.4 5.0 4.7 4.8  CL 97  --   --  98  CO2 28  --   --  27  BUN 12 14 12 15   CREATININE 0.6 0.7 0.7 0.6  CALCIUM 9.1  --   --  9.3   Recent Labs    08/11/18 11/19/18  AST 21 25  ALT 18 30  ALKPHOS 82 78   Recent Labs    08/11/18 11/19/18 02/04/19  WBC 5.7 6.3 5.4  HGB 12.3 12.4 12.6  HCT 36 37 37  PLT 282 289 284   Lab Results  Component Value Date   TSH 2.99 11/13/2017   Lab Results  Component Value Date   HGBA1C 5.8 06/29/2013   No results found for: CHOL, HDL, LDLCALC, LDLDIRECT, TRIG, CHOLHDL  Significant Diagnostic Results in last 30 days:  No results found.  Assessment/Plan There are no diagnoses linked to this encounter.   Family/ staff Communication:   Labs/tests ordered:     This encounter was created in error - please disregard.

## 2019-03-11 ENCOUNTER — Other Ambulatory Visit: Payer: Self-pay | Admitting: *Deleted

## 2019-03-11 MED ORDER — LORAZEPAM 0.5 MG PO TABS: 0.2500 mg | ORAL_TABLET | Freq: Every day | ORAL | 0 refills | Status: DC

## 2019-03-11 NOTE — Telephone Encounter (Signed)
Received fax request from [facility]. Pended Rx and sent to Dr. Gupta for approval.  

## 2019-03-11 NOTE — Telephone Encounter (Signed)
Sent to me in error. 

## 2019-03-19 ENCOUNTER — Encounter: Payer: Self-pay | Admitting: Nurse Practitioner

## 2019-03-19 ENCOUNTER — Non-Acute Institutional Stay (SKILLED_NURSING_FACILITY): Payer: Medicare Other | Admitting: Nurse Practitioner

## 2019-03-19 DIAGNOSIS — F0150 Vascular dementia without behavioral disturbance: Secondary | ICD-10-CM

## 2019-03-19 DIAGNOSIS — D509 Iron deficiency anemia, unspecified: Secondary | ICD-10-CM | POA: Diagnosis not present

## 2019-03-19 DIAGNOSIS — K219 Gastro-esophageal reflux disease without esophagitis: Secondary | ICD-10-CM | POA: Diagnosis not present

## 2019-03-19 DIAGNOSIS — F418 Other specified anxiety disorders: Secondary | ICD-10-CM | POA: Diagnosis not present

## 2019-03-19 DIAGNOSIS — R000 Tachycardia, unspecified: Secondary | ICD-10-CM | POA: Diagnosis not present

## 2019-03-19 NOTE — Assessment & Plan Note (Signed)
Stable, continue Omeprazole 10mg qd.  

## 2019-03-19 NOTE — Progress Notes (Signed)
Location:  [place] Room Number: [place] of Service:  SNF (31) Provider:  Michaelann Gunnoe, Lennie Odor  NP  [name], MD  Patient Care Team: [name], MD as PCP - General (Internal Medicine) [name], MD as Referring Physician (Ophthalmology) Harim Bi X, NP as Nurse Practitioner (Internal Medicine)  Extended Emergency Contact Information Primary Emergency Contact: [name] Address: [address]          [place],  [address] [name] of [place] Phone: [phone] Mobile Phone: [phone] Relation: Son Secondary Emergency Contact: [name] Address: [address]          [place], [place] [address] [name] of [place] Phone: [phone] Mobile Phone: [phone] Relation: Son  Code Status:  DNR Goals of care: Advanced Directive information Advanced Directives 03/19/2019  Does Patient Have a Medical Advance Directive? Yes  Type of Advance Directive Out of facility DNR (pink MOST or yellow form)  Does patient want to make changes to medical advance directive? No - Patient declined  Copy of [place] in Chart? -  Would patient like information on creating a medical advance directive? -  Pre-existing out of facility DNR order (yellow form or pink MOST form) Yellow form placed in chart (order not valid for inpatient use)     Chief Complaint  Patient presents with   Medical Management of Chronic Issues    HPI:  Pt is a 85 y.o. female seen today for medical management of chronic diseases.    The patient resides in SNF [facility] for safety, care assistance, self propels w/c to get around, on Memantine 10mg  bid for memory. Her mood is stable on Venlafaxine 150mg  qd, Lorazepm 0.25mg  qhs. Tachycardia, heart rate is in control, on Metoprolol 25mg  qd, 50mg  qd. Anemia, stable, on Fe daily. GERD, stable, on Omeprazole 10mg  qd.    Past Medical History:  Diagnosis Date   Anemia, iron  deficiency    takes Ferrous Sulfate daily   Depression    takes Effexor daily   Gait disorder 04/21/2014   GERD (gastroesophageal reflux disease)    takes Omeprazole daily   Herpes ocular    history of-takes Acyclovir daily   High cholesterol    takes Atorvastatin daily   History of hiatal hernia    Hypertension    takes Metoprolol daily   Insomnia    takes Xanax nightly   Osteoarthritis of knee    bilateral   Sciatic pain    Past Surgical History:  Procedure Laterality Date   ABDOMINAL HYSTERECTOMY  1995   partial   CARPAL TUNNEL RELEASE Right 07/21/2007   CARPAL TUNNEL RELEASE Left 09/24/2007   COLONOSCOPY     DESCEMETS STRIPPING AUTOMATED ENDOTHELIAL KERATOPLASTY Left 03/14/2011   DESCEMETS STRIPPING AUTOMATED ENDOTHELIAL KERATOPLASTY Right 08/20/2012   EYE SURGERY Bilateral    cataract surgery   EYE SURGERY Left    corneal transplant   HERNIA REPAIR     HIP ARTHROPLASTY Left 06/13/2013   Procedure: ARTHROPLASTY BIPOLAR HIP; Injection left shoulder;  Surgeon: Johnny Bridge, MD;  Location: [place];  Service: Orthopedics;  Laterality: Left;   JOINT REPLACEMENT     bilateral knees, left knee   KNEE ARTHROSCOPY Right 05/11/2001   KYPHOPLASTY N/A 04/10/2015   Procedure: T11 Kyphoplasty;  Surgeon: Jovita Gamma, MD;  Location: [place] NEURO ORS;  Service: Neurosurgery;  Laterality: N/A;  T11 Kyphoplasty   KYPHOPLASTY N/A 05/06/2015   Procedure: KYPHOPLASTY Thoracic twelve;  Surgeon: Jovita Gamma, MD;  Location: [facility]URO [facility]S;  Service: Neurosurgery;  Laterality: N/A;  T12 Kyphoplasty   LAPAROSCOPIC NISSEN FUNDOPLICATION  [id number]   LUMBAR LAMINECTOMY/DECOMPRESSION MICRODISCECTOMY  08/29/2010   L2-S1   SHOULDER ARTHROSCOPY WITH ROTATOR CUFF REPAIR AND SUBACROMIAL DECOMPRESSION Right 01/01/2000   TONSILLECTOMY     as child   TOTAL HIP REVISION Left 07/08/2014   Procedure: TOTAL HIP REVISION;  Surgeon: Kerin Salen, MD;  Location: [place];  Service:  Orthopedics;  Laterality: Left;   TOTAL KNEE ARTHROPLASTY Left 05/14/2004   TOTAL KNEE ARTHROPLASTY  12/23/2011   Procedure: TOTAL KNEE ARTHROPLASTY;  Surgeon: Lorn Junes, MD;  Location: [place];  Service: Orthopedics;  Laterality: Right;  DR Noemi Chapel WANTS 90 MINUTES FOR THIS CASE   TRIGGER FINGER RELEASE Right 07/21/2007   thumb   TRIGGER FINGER RELEASE Left 09/24/2007   middle finger   TRIGGER FINGER RELEASE Right 03/10/2008   ring and little fingers   TRIGGER FINGER RELEASE Right 09/02/2012   Procedure: RELEASE TRIGGER FINGER/A-1 PULLEY RIGHT INDEX FINGER;  Surgeon: Wynonia Sours, MD;  Location: [place];  Service: Orthopedics;  Laterality: Right;   TRIGGER FINGER RELEASE Left 12/22/2012   Procedure: RELEASE TRIGGER FINGER/A-1 PULLEY LEFT RING FINGER;  Surgeon: Wynonia Sours, MD;  Location: [place];  Service: Orthopedics;  Laterality: Left;  Left     Allergies  Allergen Reactions   Morphine And Related Other (See Comments)    AGITATION, STRANGE DREAMS   Scopolamine Other (See Comments)    MENTAL CHANGES   Atorvastatin Other (See Comments)    unknown   Morphine Other (See Comments)    Disoriented, mood changes   Nsaids Other (See Comments)    unknown   Pravachol [Pravastatin Sodium] Other (See Comments)    unknown   Pravastatin Other (See Comments)    unknown   Teriparatide Other (See Comments)    unknown    Outpatient Encounter Medications as of 03/19/2019  Medication Sig   acetaminophen (TYLENOL) 500 MG tablet Take 500 mg by mouth every 6 (six) hours as needed for mild pain or moderate pain. Also give 1 tablet  500 mg tab by mouth at bedtime   Non Aspirin NOT TO EXCEED 3,000 MG ACETAMINOPHEN IN 24 HRS (SCHEDULED + PRN DOSES).   aspirin EC 81 MG tablet Take 81 mg by mouth. To be given every 5 days.   Cholecalciferol (VITAMIN D) 2000 units CAPS Take 2,000 Units by mouth daily.   docusate sodium (COLACE) 100 MG capsule Take 1  capsule by mouth daily as needed for mild constipation.    feeding supplement (BOOST / RESOURCE BREEZE) LIQD Take 1 Container by mouth 2 (two) times daily. For weight loss    ferrous sulfate 325 (65 FE) MG tablet Take 325 mg by mouth daily.   fluticasone furoate-vilanterol (BREO ELLIPTA) 100-25 MCG/INH AEPB Inhale 1 puff into the lungs daily.   Glycerin-Hypromellose-PEG 400 (ARTIFICIAL TEARS) 0.2-0.2-1 % SOLN Place 1 drop into both eyes 4 (four) times daily.   LORazepam (ATIVAN) 0.5 MG tablet Take 0.5 tablets (0.25 mg total) by mouth at bedtime.   magnesium hydroxide (MILK OF MAGNESIA) 400 MG/5ML suspension Take 15 mLs by mouth at bedtime as needed for mild constipation.    memantine (NAMENDA) 10 MG tablet Take 10 mg by mouth 2 (two) times daily.   metoprolol tartrate (LOPRESSOR) 25 MG tablet Take 25 mg by mouth at bedtime.    METOPROLOL  TARTRATE PO Take 50 mg by mouth daily.   nitroGLYCERIN (NITROSTAT) 0.4 MG SL tablet Place 0.4 mg under the tongue every 5 (five) minutes as needed for chest pain.   omeprazole (PRILOSEC) 10 MG capsule Take 10 mg by mouth daily.   venlafaxine XR (EFFEXOR-XR) 150 MG 24 hr capsule Take 150 mg by mouth daily with breakfast.   White Petrolatum-Mineral Oil (SYSTANE NIGHTTIME) OINT Apply to both eyes at bedtime   No facility-administered encounter medications on file as of 03/19/2019.    ROS was provided with assistance of staff.  Review of Systems  Constitutional: Negative for activity change, appetite change, chills, diaphoresis, fatigue and fever.  HENT: Positive for hearing loss. Negative for congestion and voice change.   Respiratory: Negative for cough, shortness of breath and wheezing.   Cardiovascular: Negative for chest pain, palpitations and leg swelling.  Gastrointestinal: Negative for abdominal distention, abdominal pain, constipation, diarrhea, nausea and vomiting.  Genitourinary: Negative for difficulty urinating, dysuria and urgency.    Musculoskeletal: Positive for gait problem.  Skin: Negative for color change and pallor.  Neurological: Negative for dizziness, speech difficulty and headaches.       Dementia  Psychiatric/Behavioral: Negative for agitation, behavioral problems, hallucinations and sleep disturbance. The patient is not nervous/anxious.     Immunization History  Administered Date(s) Administered   Influenza Whole 03/27/2018   Influenza-Unspecified 04/14/2017   Pneumococcal Conjugate-13 09/24/2017   Pneumococcal Polysaccharide-23 06/14/2013   Td 09/25/2017   Varicella 06/29/2003   Pertinent  Health Maintenance Due  Topic Date Due   INFLUENZA VACCINE  01/23/2019   DEXA SCAN  Completed   PNA vac Low Risk Adult  Completed   Fall Risk  09/19/2017  Falls in the past year? No   Functional Status Survey:    Vitals:   03/19/19 1316  BP: 130/80  Pulse: 82  Resp: 20  Temp: (!) 97 F (36.1 C)  SpO2: 96%  Weight: 111 lb 8 oz (50.6 kg)  Height: 5\' 1"  (1.549 m)   Body mass index is 21.07 kg/m. Physical Exam Vitals signs and nursing note reviewed.  Constitutional:      General: She is not in acute distress.    Appearance: Normal appearance. She is normal weight. She is not ill-appearing, toxic-appearing or diaphoretic.  HENT:     Head: Normocephalic and atraumatic.     Mouth/Throat:     Mouth: Mucous membranes are moist.  Eyes:     Extraocular Movements: Extraocular movements intact.     Conjunctiva/sclera: Conjunctivae normal.     Pupils: Pupils are equal, round, and reactive to light.  Neck:     Musculoskeletal: Normal range of motion and neck supple.  Cardiovascular:     Rate and Rhythm: Normal rate and regular rhythm.     Heart sounds: No murmur.  Pulmonary:     Breath sounds: No wheezing or rales.  Abdominal:     General: Bowel sounds are normal. There is no distension.     Palpations: Abdomen is soft.     Tenderness: There is no abdominal tenderness. There is no right CVA  tenderness, left CVA tenderness, guarding or rebound.  Musculoskeletal:     Right lower leg: No edema.     Left lower leg: No edema.  Skin:    General: Skin is warm and dry.  Neurological:     General: No focal deficit present.     Mental Status: She is alert. Mental status is at baseline.  Cranial Nerves: No cranial nerve deficit.     Motor: No weakness.     Coordination: Coordination normal.     Gait: Gait abnormal.     Comments: Left should decreased ROM-not new. Oriented to self.  Psychiatric:        Mood and Affect: Mood normal.        Behavior: Behavior normal.     Labs reviewed: Recent Labs    08/11/18 11/19/18 02/04/19  NA 131* 134* 132*  K 5.0 4.7 4.8  CL  --   --  98  CO2  --   --  27  BUN 14 12 15   CREATININE 0.7 0.7 0.6  CALCIUM  --   --  9.3   Recent Labs    08/11/18 11/19/18  AST 21 25  ALT 18 30  ALKPHOS 82 78   Recent Labs    08/11/18 11/19/18 02/04/19  WBC 5.7 6.3 5.4  HGB 12.3 12.4 12.6  HCT 36 37 37  PLT 282 289 284   Lab Results  Component Value Date   TSH 2.99 11/13/2017   Lab Results  Component Value Date   HGBA1C 5.8 06/29/2013   No results found for: CHOL, HDL, LDLCALC, LDLDIRECT, TRIG, CHOLHDL  Significant Diagnostic Results in last 30 days:  No results found.  Assessment/Plan Depression with anxiety Her mood is stable, continue Venlafaxine 150mg  qd, Lorazepam 0.25mg  qhs.   Iron deficiency anemia Stable, continue Fe 325mg  qd.   Tachycardia Heart rate is in control, continue Metoprolol 25mg  qd, 50mg  qd.   Vascular dementia ([place]) Continue SNF [facility] for safety, care assistance, Memantine 10mg  bid for memory.   GERD (gastroesophageal reflux disease) Stable, continue Omeprazole 10mg  qd.      Family/ staff Communication: plan of care reviewed with the patient and charge nurse.   Labs/tests ordered:  none  Time spend 25 minutes.

## 2019-03-19 NOTE — Assessment & Plan Note (Signed)
Her mood is stable, continue Venlafaxine 150mg qd, Lorazepam 0.25mg qhs.  

## 2019-03-19 NOTE — Assessment & Plan Note (Signed)
Continue SNF [facility] for safety, care assistance, Memantine 10mg  bid for memory.

## 2019-03-19 NOTE — Assessment & Plan Note (Signed)
Stable, continue Fe 325mg  qd.

## 2019-03-19 NOTE — Assessment & Plan Note (Signed)
Heart rate is in control, continue Metoprolol 25mg  qd, 50mg  qd.

## 2019-03-23 DIAGNOSIS — Z03818 Encounter for observation for suspected exposure to other biological agents ruled out: Secondary | ICD-10-CM | POA: Diagnosis not present

## 2019-03-30 DIAGNOSIS — Z03818 Encounter for observation for suspected exposure to other biological agents ruled out: Secondary | ICD-10-CM | POA: Diagnosis not present

## 2019-04-14 ENCOUNTER — Encounter: Payer: Self-pay | Admitting: Nurse Practitioner

## 2019-04-14 ENCOUNTER — Non-Acute Institutional Stay (SKILLED_NURSING_FACILITY): Payer: Medicare Other | Admitting: Nurse Practitioner

## 2019-04-14 DIAGNOSIS — R269 Unspecified abnormalities of gait and mobility: Secondary | ICD-10-CM

## 2019-04-14 DIAGNOSIS — S0003XA Contusion of scalp, initial encounter: Secondary | ICD-10-CM

## 2019-04-14 DIAGNOSIS — W19XXXA Unspecified fall, initial encounter: Secondary | ICD-10-CM | POA: Diagnosis not present

## 2019-04-14 DIAGNOSIS — F418 Other specified anxiety disorders: Secondary | ICD-10-CM | POA: Diagnosis not present

## 2019-04-14 DIAGNOSIS — F0150 Vascular dementia without behavioral disturbance: Secondary | ICD-10-CM

## 2019-04-14 NOTE — Progress Notes (Signed)
Location:   [facility]   Place of Service:   [facility] Provider: [facility] Paisli Silfies NP  [name], MD  Patient Care Team: [name], MD as PCP - General (Internal Medicine) [name], MD as Referring Physician (Ophthalmology) Meilani Edmundson X, NP as Nurse Practitioner (Internal Medicine)  Extended Emergency Contact Information Primary Emergency Contact: [name] Address: [address]          [place], [place] [address] [name] of [place] Phone: [phone] Mobile Phone: [phone] Relation: Son Secondary Emergency Contact: [name] Address: [address]          [place], [place] [address] [name] of [place] Phone: [phone] Mobile Phone: [phone] Relation: Son  Code Status: DNR Goals of care: Advanced Directive information Advanced Directives 03/19/2019  Does Patient Have a Medical Advance Directive? Yes  Type of Advance Directive Out of facility DNR (pink MOST or yellow form)  Does patient want to make changes to medical advance directive? No - Patient declined  Copy of [place] in Chart? -  Would patient like information on creating a medical advance directive? -  Pre-existing out of facility DNR order (yellow form or pink MOST form) Yellow form placed in chart (order not valid for inpatient use)     Chief Complaint  Patient presents with  . Acute Visit    fall    HPI:  Pt is a 85 y.o. female seen today for an acute visit for reported fall when the patient was found lying on the floor, the patient stated she slipped on her way to bathroom, resulted in a lump on the left top of head. She denied headache, dizziness, change of vision, chest pain/pressure, palpitation, nausea, vomiting, or focal weakness. Hx of dementia, gait abnormality, anxiety, takes Lorazepam 0.'25mg'$  qhs, Memantine '10mg'$  bid, Venlafaxine '150mg'$  qd. She needs assistance for transfer, w/c for mobility.    Past Medical History:  Diagnosis  Date  . Anemia, iron deficiency    takes Ferrous Sulfate daily  . Depression    takes Effexor daily  . Gait disorder 04/21/2014  . GERD (gastroesophageal reflux disease)    takes Omeprazole daily  . Herpes ocular    history of-takes Acyclovir daily  . High cholesterol    takes Atorvastatin daily  . History of hiatal hernia   . Hypertension    takes Metoprolol daily  . Insomnia    takes Xanax nightly  . Osteoarthritis of knee    bilateral  . Sciatic pain    Past Surgical History:  Procedure Laterality Date  . ABDOMINAL HYSTERECTOMY  1995   partial  . CARPAL TUNNEL RELEASE Right 07/21/2007  . CARPAL TUNNEL RELEASE Left 09/24/2007  . COLONOSCOPY    . DESCEMETS STRIPPING AUTOMATED ENDOTHELIAL KERATOPLASTY Left 03/14/2011  . DESCEMETS STRIPPING AUTOMATED ENDOTHELIAL KERATOPLASTY Right 08/20/2012  . EYE SURGERY Bilateral    cataract surgery  . EYE SURGERY Left    corneal transplant  . HERNIA REPAIR    . HIP ARTHROPLASTY Left 06/13/2013   Procedure: ARTHROPLASTY BIPOLAR HIP; Injection left shoulder;  Surgeon: Johnny Bridge, MD;  Location: [facility];  Service: Orthopedics;  Laterality: Left;  . JOINT REPLACEMENT     bilateral knees, left knee  . KNEE ARTHROSCOPY Right 05/11/2001  . KYPHOPLASTY N/A 04/10/2015   Procedure: T11 Kyphoplasty;  Surgeon: Jovita Gamma, MD;  Location: [facility] NEURO ORS;  Service: Neurosurgery;  Laterality: N/A;  T11 Kyphoplasty  . KYPHOPLASTY N/A 05/06/2015  Procedure: KYPHOPLASTY Thoracic twelve;  Surgeon: Jovita Gamma, MD;  Location: [place] NEURO ORS;  Service: Neurosurgery;  Laterality: N/A;  T12 Kyphoplasty  . LAPAROSCOPIC NISSEN FUNDOPLICATION  11/18/7822  . LUMBAR LAMINECTOMY/DECOMPRESSION MICRODISCECTOMY  08/29/2010   L2-S1  . SHOULDER ARTHROSCOPY WITH ROTATOR CUFF REPAIR AND SUBACROMIAL DECOMPRESSION Right 01/01/2000  . TONSILLECTOMY     as child  . TOTAL HIP REVISION Left 07/08/2014   Procedure: TOTAL HIP REVISION;  Surgeon: Kerin Salen, MD;  Location:  [facility];  Service: Orthopedics;  Laterality: Left;  . TOTAL KNEE ARTHROPLASTY Left 05/14/2004  . TOTAL KNEE ARTHROPLASTY  12/23/2011   Procedure: TOTAL KNEE ARTHROPLASTY;  Surgeon: Lorn Junes, MD;  Location: [place];  Service: Orthopedics;  Laterality: Right;  DR Ewing THIS CASE  . TRIGGER FINGER RELEASE Right 07/21/2007   thumb  . TRIGGER FINGER RELEASE Left 09/24/2007   middle finger  . TRIGGER FINGER RELEASE Right 03/10/2008   ring and little fingers  . TRIGGER FINGER RELEASE Right 09/02/2012   Procedure: RELEASE TRIGGER FINGER/A-1 PULLEY RIGHT INDEX FINGER;  Surgeon: Wynonia Sours, MD;  Location: [facility];  Service: Orthopedics;  Laterality: Right;  . TRIGGER FINGER RELEASE Left 12/22/2012   Procedure: RELEASE TRIGGER FINGER/A-1 PULLEY LEFT RING FINGER;  Surgeon: Wynonia Sours, MD;  Location: [facility];  Service: Orthopedics;  Laterality: Left;  Left     Allergies  Allergen Reactions  . Morphine And Related Other (See Comments)    AGITATION, STRANGE DREAMS  . Scopolamine Other (See Comments)    MENTAL CHANGES  . Atorvastatin Other (See Comments)    unknown  . Morphine Other (See Comments)    Disoriented, mood changes  . Nsaids Other (See Comments)    unknown  . Pravachol [Pravastatin Sodium] Other (See Comments)    unknown  . Pravastatin Other (See Comments)    unknown  . Teriparatide Other (See Comments)    unknown    Allergies as of 04/14/2019      Reactions   Morphine And Related Other (See Comments)   AGITATION, STRANGE DREAMS   Scopolamine Other (See Comments)   MENTAL CHANGES   Atorvastatin Other (See Comments)   unknown   Morphine Other (See Comments)   Disoriented, mood changes   Nsaids Other (See Comments)   unknown   Pravachol [pravastatin Sodium] Other (See Comments)   unknown   Pravastatin Other (See Comments)   unknown   Teriparatide Other (See Comments)   unknown      Medication List        Accurate as of April 14, 2019  4:29 PM. If you have any questions, ask your nurse or doctor.        acetaminophen 500 MG tablet Commonly known as: TYLENOL Take 500 mg by mouth every 6 (six) hours as needed for mild pain or moderate pain. Also give 1 tablet  500 mg tab by mouth at bedtime   Non Aspirin NOT TO EXCEED 3,000 MG ACETAMINOPHEN IN 24 HRS (SCHEDULED + PRN DOSES).   Artificial Tears 0.2-0.2-1 % Soln Generic drug: Glycerin-Hypromellose-PEG 400 Place 1 drop into both eyes 4 (four) times daily.   aspirin EC 81 MG tablet Take 81 mg by mouth. To be given every 5 days.   Breo Ellipta 100-25 MCG/INH Aepb Generic drug: fluticasone furoate-vilanterol Inhale 1 puff into the lungs daily.   docusate sodium 100 MG capsule Commonly known as: COLACE Take 1 capsule by  mouth daily as needed for mild constipation.   feeding supplement Liqd Commonly known as: BOOST / RESOURCE BREEZE Take 1 Container by mouth 2 (two) times daily. For weight loss   ferrous sulfate 325 (65 FE) MG tablet Take 325 mg by mouth daily.   LORazepam 0.5 MG tablet Commonly known as: ATIVAN Take 0.5 tablets (0.25 mg total) by mouth at bedtime.   magnesium hydroxide 400 MG/5ML suspension Commonly known as: MILK OF MAGNESIA Take 15 mLs by mouth at bedtime as needed for mild constipation.   memantine 10 MG tablet Commonly known as: NAMENDA Take 10 mg by mouth 2 (two) times daily.   metoprolol tartrate 25 MG tablet Commonly known as: LOPRESSOR Take 25 mg by mouth at bedtime.   METOPROLOL TARTRATE PO Take 50 mg by mouth daily.   nitroGLYCERIN 0.4 MG SL tablet Commonly known as: NITROSTAT Place 0.4 mg under the tongue every 5 (five) minutes as needed for chest pain.   omeprazole 10 MG capsule Commonly known as: PRILOSEC Take 10 mg by mouth daily.   Systane Nighttime Oint Apply to both eyes at bedtime   venlafaxine XR 150 MG 24 hr capsule Commonly known as: EFFEXOR-XR Take 150 mg by mouth daily with  breakfast.   Vitamin D 50 MCG (2000 UT) Caps Take 2,000 Units by mouth daily.      ROS was provided with assistance of staff.  Review of Systems  Constitutional: Negative for activity change, appetite change, chills, diaphoresis and fatigue.  HENT: Positive for hearing loss. Negative for congestion and voice change.   Respiratory: Negative for cough and shortness of breath.   Cardiovascular: Positive for leg swelling. Negative for chest pain and palpitations.  Gastrointestinal: Negative.  Negative for abdominal pain, constipation, diarrhea, nausea and vomiting.  Genitourinary: Negative for difficulty urinating, dysuria and urgency.       Incontinent of urine  Musculoskeletal: Positive for gait problem.  Skin: Positive for wound. Negative for color change and pallor.  Neurological: Negative for dizziness, speech difficulty, weakness and headaches.       Dementia  Psychiatric/Behavioral: Negative for agitation, behavioral problems, hallucinations and sleep disturbance. The patient is not nervous/anxious.     Immunization History  Administered Date(s) Administered  . Influenza Whole 03/27/2018  . Influenza-Unspecified 04/14/2017  . Pneumococcal Conjugate-13 09/24/2017  . Pneumococcal Polysaccharide-23 06/14/2013  . Td 09/25/2017  . Varicella 06/29/2003   Pertinent  Health Maintenance Due  Topic Date Due  . INFLUENZA VACCINE  01/23/2019  . DEXA SCAN  Completed  . PNA vac Low Risk Adult  Completed   Fall Risk  09/19/2017  Falls in the past year? No   Functional Status Survey:    Vitals:   04/14/19 1201  BP: 140/80  Pulse: 77  Temp: (!) 97.1 F (36.2 C)  SpO2: 95%   There is no height or weight on file to calculate BMI. Physical Exam Vitals signs and nursing note reviewed.  Constitutional:      General: She is not in acute distress.    Appearance: Normal appearance. She is not ill-appearing, toxic-appearing or diaphoretic.  HENT:     Head: Normocephalic and  atraumatic.     Nose: Nose normal.     Mouth/Throat:     Mouth: Mucous membranes are moist.  Eyes:     Extraocular Movements: Extraocular movements intact.     Conjunctiva/sclera: Conjunctivae normal.     Pupils: Pupils are equal, round, and reactive to light.  Neck:  Musculoskeletal: Normal range of motion and neck supple.  Cardiovascular:     Rate and Rhythm: Normal rate and regular rhythm.     Heart sounds: No murmur.  Abdominal:     General: Bowel sounds are normal. There is no distension.     Palpations: Abdomen is soft.     Tenderness: There is no abdominal tenderness. There is no right CVA tenderness, left CVA tenderness, guarding or rebound.  Musculoskeletal:     Right lower leg: Edema present.     Left lower leg: Edema present.     Comments: Trace edema BLE  Skin:    General: Skin is warm and dry.     Comments: A small lump left parietal region.   Neurological:     General: No focal deficit present.     Mental Status: She is alert. Mental status is at baseline.     Cranial Nerves: No cranial nerve deficit.     Motor: No weakness.     Coordination: Coordination normal.     Gait: Gait abnormal.     Comments: Oriented to self.   Psychiatric:        Mood and Affect: Mood normal.        Behavior: Behavior normal.     Labs reviewed: Recent Labs    08/11/18 11/19/18 02/04/19  NA 131* 134* 132*  K 5.0 4.7 4.8  CL  --   --  98  CO2  --   --  27  BUN '14 12 15  '$ CREATININE 0.7 0.7 0.6  CALCIUM  --   --  9.3   Recent Labs    08/11/18 11/19/18  AST 21 25  ALT 18 30  ALKPHOS 82 78   Recent Labs    08/11/18 11/19/18 02/04/19  WBC 5.7 6.3 5.4  HGB 12.3 12.4 12.6  HCT 36 37 37  PLT 282 289 284   Lab Results  Component Value Date   TSH 2.99 11/13/2017   Lab Results  Component Value Date   HGBA1C 5.8 06/29/2013   No results found for: CHOL, HDL, LDLCALC, LDLDIRECT, TRIG, CHOLHDL  Significant Diagnostic Results in last 30 days:  No results found.   Assessment/Plan: Fall Slipped and fell on way to bathroom, lack of safety awareness, abnormal gait are contributory, will update CBC/diff, CMP/eGFR. Close supervision for safety.   Scalp contusion Left parietal region, it should heal.   Gait disorder Close supervision, assistance for transfer, continue w/c for mobility.   Depression with anxiety Her mood is stable, continue Venlafaxine '150mg'$  qd, Lorazepam 0.'25mg'$  qhs  Vascular dementia ([place]) Continue SNF [facility] for safety, care assistance, continue Memantine '10mg'$  bid for memory.     Family/ staff Communication: plan of care reviewed with the patient and charge nurse.   Labs/tests ordered:  CBC/diff, CMP/eGFR  Time spend 25 minutes.

## 2019-04-14 NOTE — Assessment & Plan Note (Signed)
Left parietal region, it should heal.

## 2019-04-14 NOTE — Assessment & Plan Note (Signed)
Slipped and fell on way to bathroom, lack of safety awareness, abnormal gait are contributory, will update CBC/diff, CMP/eGFR. Close supervision for safety.

## 2019-04-14 NOTE — Assessment & Plan Note (Signed)
Her mood is stable, continue Venlafaxine 150mg qd, Lorazepam 0.25mg qhs.  

## 2019-04-14 NOTE — Assessment & Plan Note (Signed)
Continue SNF [facility] for safety, care assistance, continue Memantine 10mg  bid for memory.

## 2019-04-14 NOTE — Assessment & Plan Note (Signed)
Close supervision, assistance for transfer, continue w/c for mobility.

## 2019-04-15 DIAGNOSIS — I10 Essential (primary) hypertension: Secondary | ICD-10-CM | POA: Diagnosis not present

## 2019-04-15 DIAGNOSIS — W19XXXA Unspecified fall, initial encounter: Secondary | ICD-10-CM | POA: Diagnosis not present

## 2019-04-15 LAB — BASIC METABOLIC PANEL
BUN: 13 (ref 4–21)
CO2: 27 — AB (ref 13–22)
Chloride: 97 — AB (ref 99–108)
Creatinine: 0.6 (ref 0.5–1.1)
Glucose: 107
Potassium: 4.3 (ref 3.4–5.3)
Sodium: 132 — AB (ref 137–147)

## 2019-04-15 LAB — COMPREHENSIVE METABOLIC PANEL
Albumin: 4 (ref 3.5–5.0)
Calcium: 9 (ref 8.7–10.7)
Globulin: 2.9

## 2019-04-15 LAB — CBC AND DIFFERENTIAL
HCT: 37 (ref 36–46)
Hemoglobin: 12.7 (ref 12.0–16.0)
Platelets: 295 (ref 150–399)
WBC: 7.5

## 2019-04-15 LAB — HEPATIC FUNCTION PANEL
ALT: 27 (ref 7–35)
AST: 26 (ref 13–35)
Alkaline Phosphatase: 95 (ref 25–125)
Bilirubin, Total: 0.5

## 2019-04-15 LAB — CBC: RBC: 3.9 (ref 3.87–5.11)

## 2019-04-16 ENCOUNTER — Other Ambulatory Visit: Payer: Self-pay | Admitting: *Deleted

## 2019-04-16 NOTE — Telephone Encounter (Signed)
Received fax from [facility] Pended Rx and sent to Dr. Gupta for approval.  

## 2019-04-17 MED ORDER — LORAZEPAM 0.5 MG PO TABS: 0.2500 mg | ORAL_TABLET | Freq: Every day | ORAL | 0 refills | Status: DC

## 2019-04-20 DIAGNOSIS — Z03818 Encounter for observation for suspected exposure to other biological agents ruled out: Secondary | ICD-10-CM | POA: Diagnosis not present

## 2019-04-23 ENCOUNTER — Non-Acute Institutional Stay (SKILLED_NURSING_FACILITY): Payer: Medicare Other | Admitting: Nurse Practitioner

## 2019-04-23 ENCOUNTER — Encounter: Payer: Self-pay | Admitting: Nurse Practitioner

## 2019-04-23 DIAGNOSIS — R000 Tachycardia, unspecified: Secondary | ICD-10-CM | POA: Diagnosis not present

## 2019-04-23 DIAGNOSIS — D509 Iron deficiency anemia, unspecified: Secondary | ICD-10-CM

## 2019-04-23 DIAGNOSIS — K219 Gastro-esophageal reflux disease without esophagitis: Secondary | ICD-10-CM | POA: Diagnosis not present

## 2019-04-23 DIAGNOSIS — F418 Other specified anxiety disorders: Secondary | ICD-10-CM | POA: Diagnosis not present

## 2019-04-23 DIAGNOSIS — F0150 Vascular dementia without behavioral disturbance: Secondary | ICD-10-CM | POA: Diagnosis not present

## 2019-04-23 NOTE — Assessment & Plan Note (Signed)
Heart rate is in control, continue Metoprolol 50mg  qd, 25mg  qd.

## 2019-04-23 NOTE — Assessment & Plan Note (Signed)
Stable, continue Omeprazole 10mg qd.  

## 2019-04-23 NOTE — Assessment & Plan Note (Signed)
Her mood is stable, continue Lorazepam 0.25mg  qhs, Effexor 150mg  qd.

## 2019-04-23 NOTE — Progress Notes (Signed)
Location:   SNF [place] Room Number: 18 Place of Service:  SNF (31)SNF [facility] Provider:  ,  NP  [name], MD  Patient Care Team: [name], MD as PCP - General (Internal Medicine) [name], MD as Referring Physician (Ophthalmology) ,  X, NP as Nurse Practitioner (Internal Medicine)  Extended Emergency Contact Information Primary Emergency Contact: [name] Address: [address]          [place], [place] [address] [name] of [place] Phone: [phone] Mobile Phone: [phone] Relation: Son Secondary Emergency Contact: [name] Address: [address]          [place], [place] [address] [name] of [place] Phone: [phone] Mobile Phone: [phone] Relation: Son  Code Status:  DNR Goals of care: Advanced Directive information Advanced Directives 04/23/2019  Does Patient Have a Medical Advance Directive? Yes  Type of Advance Directive Out of facility DNR (pink MOST or yellow form)  Does patient want to make changes to medical advance directive? No - Patient declined  Copy of [place] in Chart? -  Would patient like information on creating a medical advance directive? -  Pre-existing out of facility DNR order (yellow form or pink MOST form) Pink MOST form placed in chart (order not valid for inpatient use)     Chief Complaint  Patient presents with  . Medical agement of Chronic Issues    HPI:  Pt is a 85 y.o. female seen today for medical management of chronic diseases.     The patient resides in SNF [facility] for safety, care assistance, on Memantine 10mg  bid for memory, self propels w/c to get around. Her mood is stable, on Lorazepam 0.25mg  qhs, Effexor 150mg  qd. GERD, stable, on Omeprazole 10mg  qd. Tachycardia, heart rate is in controled, on Metoprolol 25mg  qd, 50mg  qd. Anemia, stable, on Fe 325mg  qd.    Past Medical History:  Diagnosis Date  . Anemia, iron deficiency     takes Ferrous Sulfate daily  . Depression    takes Effexor daily  . Gait disorder 04/21/2014  . GERD (gastroesophageal reflux disease)    takes Omeprazole daily  . Herpes ocular    history of-takes Acyclovir daily  . High cholesterol    takes Atorvastatin daily  . History of hiatal hernia   . Hypertension    takes Metoprolol daily  . Insomnia    takes Xanax nightly  . Osteoarthritis of knee    bilateral  . Sciatic pain    Past Surgical History:  Procedure Laterality Date  . ABDOMINAL HYSTERECTOMY  1995   partial  . CARPAL TUNNEL RELEASE Right 07/21/2007  . CARPAL TUNNEL RELEASE Left 09/24/2007  . COLONOSCOPY    . DESCEMETS STRIPPING AUTOMATED ENDOTHELIAL KERATOPLASTY Left 03/14/2011  . DESCEMETS STRIPPING AUTOMATED ENDOTHELIAL KERATOPLASTY Right 08/20/2012  . EYE SURGERY Bilateral    cataract surgery  . EYE SURGERY Left    corneal transplant  . HERNIA REPAIR    . HIP ARTHROPLASTY Left 06/13/2013   Procedure: ARTHROPLASTY BIPOLAR HIP; Injection left shoulder;  Surgeon: Johnny Bridge, MD;  Location: [place];  Service: Orthopedics;  Laterality: Left;  . JOINT REPLACEMENT     bilateral knees, left knee  . KNEE ARTHROSCOPY Right 05/11/2001  . KYPHOPLASTY N/A 04/10/2015   Procedure: T11 Kyphoplasty;  Surgeon: Jovita Gamma, MD;  Location: [facility];  Service: Neurosurgery;  Laterality: N/A;  T11 Kyphoplasty  . KYPHOPLASTY N/A 05/06/2015   Procedure: KYPHOPLASTY Thoracic  twelve;  Surgeon: Jovita Gamma, MD;  Location: [facility];  Service: Neurosurgery;  Laterality: N/A;  T12 Kyphoplasty  . LAPAROSCOPIC NISSEN FUNDOPLICATION  [id number]  . LUMBAR LAMINECTOMY/DECOMPRESSION MICRODISCECTOMY  08/29/2010   L2-S1  . SHOULDER ARTHROSCOPY WITH ROTATOR CUFF REPAIR AND SUBACROMIAL DECOMPRESSION Right 01/01/2000  . TONSILLECTOMY     as child  . TOTAL HIP REVISION Left 07/08/2014   Procedure: TOTAL HIP REVISION;  Surgeon: Kerin Salen, MD;  Location: [facility];  Service: Orthopedics;   Laterality: Left;  . TOTAL KNEE ARTHROPLASTY Left 05/14/2004  . TOTAL KNEE ARTHROPLASTY  12/23/2011   Procedure: TOTAL KNEE ARTHROPLASTY;  Surgeon: Lorn Junes, MD;  Location: [place];  Service: Orthopedics;  Laterality: Right;  DR [place] A[place] THIS CASE  . TRIGGER FINGER RELEASE Right 07/21/2007   thumb  . TRIGGER FINGER RELEASE Left 09/24/2007   middle finger  . TRIGGER FINGER RELEASE Right 03/10/2008   ring and little fingers  . TRIGGER FINGER RELEASE Right 09/02/2012   Procedure: RELEASE TRIGGER FINGER/A-1 PULLEY RIGHT INDEX FINGER;  Surgeon: Wynonia Sours, MD;  Location: [place];  Service: Orthopedics;  Laterality: Right;  . TRIGGER FINGER RELEASE Left 12/22/2012   Procedure: RELEASE TRIGGER FINGER/A-1 PULLEY LEFT RING FINGER;  Surgeon: Wynonia Sours, MD;  Location: [place];  Service: Orthopedics;  Laterality: Left;  Left     Allergies  Allergen Reactions  . Morphine And Related Other (See Comments)    AGITATION, STRANGE DREAMS  . Scopolamine Other (See Comments)    MENTAL CHANGES  . Atorvastatin Other (See Comments)    unknown  . Morphine Other (See Comments)    Disoriented, mood changes  . Nsaids Other (See Comments)    unknown  . Pravachol [Pravastatin Sodium] Other (See Comments)    unknown  . Pravastatin Other (See Comments)    unknown  . Teriparatide Other (See Comments)    unknown    Allergies as of 04/23/2019      Reactions   Morphine And Related Other (See Comments)   AGITATION, STRANGE DREAMS   Scopolamine Other (See Comments)   MENTAL CHANGES   Atorvastatin Other (See Comments)   unknown   Morphine Other (See Comments)   Disoriented, mood changes   Nsaids Other (See Comments)   unknown   Pravachol [pravastatin Sodium] Other (See Comments)   unknown   Pravastatin Other (See Comments)   unknown   Teriparatide Other (See Comments)   unknown      Medication List       Accurate as of April 23, 2019  11:59 PM. If you have any questions, ask your nurse or doctor.        acetaminophen 500 MG tablet Commonly known as: TYLENOL Take 500 mg by mouth every 6 (six) hours as needed for mild pain or moderate pain. Also give 1 tablet  500 mg tab by mouth at bedtime   Non Aspirin NOT TO EXCEED 3,000 MG ACETAMINOPHEN IN 24 HRS (SCHEDULED + PRN DOSES).   Artificial Tears 0.2-0.2-1 % Soln Generic drug: Glycerin-Hypromellose-PEG 400 Place 1 drop into both eyes 4 (four) times daily.   aspirin EC 81 MG tablet Take 81 mg by mouth. To be given every 5 days.   Breo Ellipta 100-25 MCG/INH Aepb Generic drug: fluticasone furoate-vilanterol Inhale 1 puff into the lungs daily.   docusate sodium 100 MG capsule Commonly known as: COLACE Take 1 capsule by mouth daily as needed  for mild constipation.   feeding supplement Liqd Commonly known as: BOOST / RESOURCE BREEZE Take 1 Container by mouth 2 (two) times daily. For weight loss   ferrous sulfate 325 (65 FE) MG tablet Take 325 mg by mouth daily.   LORazepam 0.5 MG tablet Commonly known as: ATIVAN Take 0.5 tablets (0.25 mg total) by mouth at bedtime.   magnesium hydroxide 400 MG/5ML suspension Commonly known as: MILK OF MAGNESIA Take 15 mLs by mouth at bedtime as needed for mild constipation.   memantine 10 MG tablet Commonly known as: NAMENDA Take 10 mg by mouth 2 (two) times daily.   metoprolol tartrate 25 MG tablet Commonly known as: LOPRESSOR Take 25 mg by mouth at bedtime.   METOPROLOL TARTRATE PO Take 50 mg by mouth daily.   nitroGLYCERIN 0.4 MG SL tablet Commonly known as: NITROSTAT Place 0.4 mg under the tongue every 5 (five) minutes as needed for chest pain.   omeprazole 10 MG capsule Commonly known as: PRILOSEC Take 10 mg by mouth daily.   Systane Nighttime Oint Apply to both eyes at bedtime   venlafaxine XR 150 MG 24 hr capsule Commonly known as: EFFEXOR-XR Take 150 mg by mouth daily with breakfast.   Vitamin D 50 MCG  (2000 UT) Caps Take 2,000 Units by mouth daily.      ROS was provided with assistance of staff.  Review of Systems  Constitutional: Negative for activity change, appetite change, chills, diaphoresis, fatigue, fever and unexpected weight change.  HENT: Positive for hearing loss. Negative for congestion and voice change.   Respiratory: Negative for cough, shortness of breath and wheezing.   Cardiovascular: Negative for chest pain, palpitations and leg swelling.  Gastrointestinal: Negative for abdominal distention, abdominal pain, constipation, diarrhea, nausea and vomiting.  Genitourinary: Negative for difficulty urinating, dysuria and urgency.  Musculoskeletal: Positive for arthralgias and gait problem.  Skin: Negative for color change and pallor.  Neurological: Negative for dizziness, speech difficulty, weakness and headaches.       Dementia  Psychiatric/Behavioral: Negative for agitation, behavioral problems, hallucinations and sleep disturbance. The patient is not nervous/anxious.     Immunization History  Administered Date(s) Administered  . Influenza Whole 03/27/2018  . Influenza-Unspecified 04/14/2017  . Pneumococcal Conjugate-13 09/24/2017  . Pneumococcal Polysaccharide-23 06/14/2013  . Td 09/25/2017  . Varicella 06/29/2003   Pertinent  Health Maintenance Due  Topic Date Due  . INFLUENZA VACCINE  01/23/2019  . DEXA SCAN  Completed  . PNA vac Low Risk Adult  Completed   Fall Risk  09/19/2017  Falls in the past year? No   Functional Status Survey:    Vitals:   04/23/19 1356  BP: 140/70  Pulse: 72  Resp: 18  Temp: (!) 97.4 F (36.3 C)  SpO2: 98%  Weight: 110 lb 12.8 oz (50.3 kg)  Height: 5\' 1"  (1.549 m)   Body mass index is 20.94 kg/m. Physical Exam Vitals signs and nursing note reviewed.  Constitutional:      General: She is not in acute distress.    Appearance: Normal appearance. She is normal weight. She is not ill-appearing or toxic-appearing.  HENT:      Head: Normocephalic and atraumatic.     Nose: Nose normal.     Mouth/Throat:     Mouth: Mucous membranes are moist.  Eyes:     Extraocular Movements: Extraocular movements intact.     Conjunctiva/sclera: Conjunctivae normal.     Pupils: Pupils are equal, round, and reactive to light.  Neck:     Musculoskeletal: Normal range of motion and neck supple.  Cardiovascular:     Rate and Rhythm: Normal rate and regular rhythm.     Heart sounds: No murmur.  Pulmonary:     Breath sounds: No wheezing, rhonchi or rales.  Chest:     Chest wall: No tenderness.  Abdominal:     General: Bowel sounds are normal. There is no distension.     Palpations: Abdomen is soft.     Tenderness: There is no abdominal tenderness. There is no right CVA tenderness, guarding or rebound.  Musculoskeletal:     Right lower leg: No edema.     Left lower leg: No edema.     Comments: Left shoulder limited ROM  Skin:    General: Skin is warm and dry.  Neurological:     General: No focal deficit present.     Mental Status: She is alert. Mental status is at baseline.     Cranial Nerves: No cranial nerve deficit.     Motor: No weakness.     Coordination: Coordination normal.     Gait: Gait abnormal.     Comments: Oriented to self.   Psychiatric:        Mood and Affect: Mood normal.        Behavior: Behavior normal.     Labs reviewed: Recent Labs    08/11/18 11/19/18 02/04/19  NA 131* 134* 132*  K 5.0 4.7 4.8  CL  --   --  98  CO2  --   --  27  BUN 14 12 15   CREATININE 0.7 0.7 0.6  CALCIUM  --   --  9.3   Recent Labs    08/11/18 11/19/18  AST 21 25  ALT 18 30  ALKPHOS 82 78   Recent Labs    08/11/18 11/19/18 02/04/19  WBC 5.7 6.3 5.4  HGB 12.3 12.4 12.6  HCT 36 37 37  PLT 282 289 284   Lab Results  Component Value Date   TSH 2.99 11/13/2017   Lab Results  Component Value Date   HGBA1C 5.8 06/29/2013   No results found for: CHOL, HDL, LDLCALC, LDLDIRECT, TRIG, CHOLHDL  Significant  Diagnostic Results in last 30 days:  No results found.  Assessment/Plan Iron deficiency anemia Stable, continue Fe supplement daily  Tachycardia Heart rate is in control, continue Metoprolol 50mg  qd, 25mg  qd.   GERD (gastroesophageal reflux disease) Stable, continue Omeprazole 10mg  qd.   Vascular dementia ([place]) Stable, continue SNF [facility] for safety, care assistance, self propels w/c to get around, continue Memantine 10mg  bid for memory.   Depression with anxiety Her mood is stable, continue Lorazepam 0.25mg  qhs, Effexor 150mg  qd.      Family/ staff Communication: plan of care reviewed with the patient and charge nurse.   Labs/tests ordered:  none  Time spend 25 minutes.

## 2019-04-23 NOTE — Assessment & Plan Note (Signed)
Stable, continue Fe supplement daily

## 2019-04-23 NOTE — Assessment & Plan Note (Signed)
Stable, continue SNF [facility] for safety, care assistance, self propels w/c to get around, continue Memantine 10mg  bid for memory.

## 2019-04-27 DIAGNOSIS — Z20828 Contact with and (suspected) exposure to other viral communicable diseases: Secondary | ICD-10-CM | POA: Diagnosis not present

## 2019-04-28 ENCOUNTER — Non-Acute Institutional Stay (SKILLED_NURSING_FACILITY): Payer: Medicare Other | Admitting: Nurse Practitioner

## 2019-04-28 DIAGNOSIS — E871 Hypo-osmolality and hyponatremia: Secondary | ICD-10-CM

## 2019-04-28 DIAGNOSIS — H10012 Acute follicular conjunctivitis, left eye: Secondary | ICD-10-CM

## 2019-04-28 DIAGNOSIS — F0150 Vascular dementia without behavioral disturbance: Secondary | ICD-10-CM | POA: Diagnosis not present

## 2019-04-28 DIAGNOSIS — H00014 Hordeolum externum left upper eyelid: Secondary | ICD-10-CM

## 2019-04-28 DIAGNOSIS — R000 Tachycardia, unspecified: Secondary | ICD-10-CM

## 2019-04-28 NOTE — Assessment & Plan Note (Signed)
Will apply Maxitrol tid to the left eye x 5 days, observe.

## 2019-04-28 NOTE — Progress Notes (Signed)
Location:   SNF [place] Room Number: 34 Place of Service:  SNF (31) Provider: [facility] Antwoine Zorn NP  [name], MD  Patient Care Team: [name], MD as PCP - General (Internal Medicine) [name], MD as Referring Physician (Ophthalmology) Beola Vasallo X, NP as Nurse Practitioner (Internal Medicine)  Extended Emergency Contact Information Primary Emergency Contact: [name] Address: [address]          [place], [place] [address] [name] of [place] Phone: [phone] Mobile Phone: [phone] Relation: Son Secondary Emergency Contact: [name] Address: [address]          [place], [place] [address] [name] of [place] Phone: [phone] Mobile Phone: [phone] Relation: Son  Code Status: DNR Goals of care: Advanced Directive information Advanced Directives 04/23/2019  Does Patient Have a Medical Advance Directive? Yes  Type of Advance Directive Out of facility DNR (pink MOST or yellow form)  Does patient want to make changes to medical advance directive? No - Patient declined  Copy of [place] in Chart? -  Would patient like information on creating a medical advance directive? -  Pre-existing out of facility DNR order (yellow form or pink MOST form) Pink MOST form placed in chart (order not valid for inpatient use)     Chief Complaint  Patient presents with  . Acute Visit    right eye redness    HPI:  Pt is a 85 y.o. female seen today for an acute visit for irritated/scratchy sensation of the left eye, a stye upper L eyelid noted, mild injected left conjunctiva present. HPI was provided with assistance of staff. The patient resides in SNF [facility] for safety, care assistance, w/c for mobility, on Memantine 10mg  bid.  Hx of Hyponatremia, Na 130s, last serum Na 132 02/04/19. Tachycardia, paroxysmal, 110s, on Metoprolol. She denied dizziness, change of vision, chest pain/pressure, SOB, palpitation,  or focal weakness.    Past Medical History:  Diagnosis Date  . Anemia, iron deficiency    takes Ferrous Sulfate daily  . Depression    takes Effexor daily  . Gait disorder 04/21/2014  . GERD (gastroesophageal reflux disease)    takes Omeprazole daily  . Herpes ocular    history of-takes Acyclovir daily  . High cholesterol    takes Atorvastatin daily  . History of hiatal hernia   . Hypertension    takes Metoprolol daily  . Insomnia    takes Xanax nightly  . Osteoarthritis of knee    bilateral  . Sciatic pain    Past Surgical History:  Procedure Laterality Date  . ABDOMINAL HYSTERECTOMY  1995   partial  . CARPAL TUNNEL RELEASE Right 07/21/2007  . CARPAL TUNNEL RELEASE Left 09/24/2007  . COLONOSCOPY    . DESCEMETS STRIPPING AUTOMATED ENDOTHELIAL KERATOPLASTY Left 03/14/2011  . DESCEMETS STRIPPING AUTOMATED ENDOTHELIAL KERATOPLASTY Right 08/20/2012  . EYE SURGERY Bilateral    cataract surgery  . EYE SURGERY Left    corneal transplant  . HERNIA REPAIR    . HIP ARTHROPLASTY Left 06/13/2013   Procedure: ARTHROPLASTY BIPOLAR HIP; Injection left shoulder;  Surgeon: Johnny Bridge, MD;  Location: [facility];  Service: Orthopedics;  Laterality: Left;  . JOINT REPLACEMENT     bilateral knees, left knee  . KNEE ARTHROSCOPY Right 05/11/2001  . KYPHOPLASTY N/A 04/10/2015   Procedure: T11 Kyphoplasty;  Surgeon: Jovita Gamma, MD;  Location: [facility];  Service: Neurosurgery;  Laterality: N/A;  T11 Kyphoplasty  .  KYPHOPLASTY N/A 05/06/2015   Procedure: KYPHOPLASTY Thoracic twelve;  Surgeon: Jovita Gamma, MD;  Location: [facility];  Service: Neurosurgery;  Laterality: N/A;  T12 Kyphoplasty  . LAPAROSCOPIC NISSEN FUNDOPLICATION  [id number]  . LUMBAR LAMINECTOMY/DECOMPRESSION MICRODISCECTOMY  08/29/2010   L2-S1  . SHOULDER ARTHROSCOPY WITH ROTATOR CUFF REPAIR AND SUBACROMIAL DECOMPRESSION Right 01/01/2000  . TONSILLECTOMY     as child  . TOTAL HIP REVISION Left 07/08/2014   Procedure:  TOTAL HIP REVISION;  Surgeon: Kerin Salen, MD;  Location: [facility];  Service: Orthopedics;  Laterality: Left;  . TOTAL KNEE ARTHROPLASTY Left 05/14/2004  . TOTAL KNEE ARTHROPLASTY  12/23/2011   Procedure: TOTAL KNEE ARTHROPLASTY;  Surgeon: Lorn Junes, MD;  Location: [facility];  Service: Orthopedics;  Laterality: Right;  DR [place] THIS CASE  . TRIGGER FINGER RELEASE Right 07/21/2007   thumb  . TRIGGER FINGER RELEASE Left 09/24/2007   middle finger  . TRIGGER FINGER RELEASE Right 03/10/2008   ring and little fingers  . TRIGGER FINGER RELEASE Right 09/02/2012   Procedure: RELEASE TRIGGER FINGER/A-1 PULLEY RIGHT INDEX FINGER;  Surgeon: Wynonia Sours, MD;  Location: [facility];  Service: Orthopedics;  Laterality: Right;  . TRIGGER FINGER RELEASE Left 12/22/2012   Procedure: RELEASE TRIGGER FINGER/A-1 PULLEY LEFT RING FINGER;  Surgeon: Wynonia Sours, MD;  Location: [facility];  Service: Orthopedics;  Laterality: Left;  Left     Allergies  Allergen Reactions  . Morphine And Related Other (See Comments)    AGITATION, STRANGE DREAMS  . Scopolamine Other (See Comments)    MENTAL CHANGES  . Atorvastatin Other (See Comments)    unknown  . Morphine Other (See Comments)    Disoriented, mood changes  . Nsaids Other (See Comments)    unknown  . Pravachol [Pravastatin Sodium] Other (See Comments)    unknown  . Pravastatin Other (See Comments)    unknown  . Teriparatide Other (See Comments)    unknown    Allergies as of 04/28/2019      Reactions   Morphine And Related Other (See Comments)   AGITATION, STRANGE DREAMS   Scopolamine Other (See Comments)   MENTAL CHANGES   Atorvastatin Other (See Comments)   unknown   Morphine Other (See Comments)   Disoriented, mood changes   Nsaids Other (See Comments)   unknown   Pravachol [pravastatin Sodium] Other (See Comments)   unknown   Pravastatin Other (See Comments)   unknown   Teriparatide Other (See  Comments)   unknown      Medication List       Accurate as of April 28, 2019 11:59 PM. If you have any questions, ask your nurse or doctor.        acetaminophen 500 MG tablet Commonly known as: TYLENOL Take 500 mg by mouth every 6 (six) hours as needed for mild pain or moderate pain. Also give 1 tablet  500 mg tab by mouth at bedtime   Non Aspirin NOT TO EXCEED 3,000 MG ACETAMINOPHEN IN 24 HRS (SCHEDULED + PRN DOSES).   Artificial Tears 0.2-0.2-1 % Soln Generic drug: Glycerin-Hypromellose-PEG 400 Place 1 drop into both eyes 4 (four) times daily.   aspirin EC 81 MG tablet Take 81 mg by mouth. To be given every 5 days.   Breo Ellipta 100-25 MCG/INH Aepb Generic drug: fluticasone furoate-vilanterol Inhale 1 puff into the lungs daily.   docusate sodium 100 MG capsule Commonly known as: COLACE  Take 1 capsule by mouth daily as needed for mild constipation.   feeding supplement Liqd Commonly known as: BOOST / RESOURCE BREEZE Take 1 Container by mouth 2 (two) times daily. For weight loss   ferrous sulfate 325 (65 FE) MG tablet Take 325 mg by mouth daily.   LORazepam 0.5 MG tablet Commonly known as: ATIVAN Take 0.5 tablets (0.25 mg total) by mouth at bedtime.   magnesium hydroxide 400 MG/5ML suspension Commonly known as: MILK OF MAGNESIA Take 15 mLs by mouth at bedtime as needed for mild constipation.   memantine 10 MG tablet Commonly known as: NAMENDA Take 10 mg by mouth 2 (two) times daily.   metoprolol tartrate 25 MG tablet Commonly known as: LOPRESSOR Take 25 mg by mouth at bedtime.   METOPROLOL TARTRATE PO Take 50 mg by mouth daily.   nitroGLYCERIN 0.4 MG SL tablet Commonly known as: NITROSTAT Place 0.4 mg under the tongue every 5 (five) minutes as needed for chest pain.   omeprazole 10 MG capsule Commonly known as: PRILOSEC Take 10 mg by mouth daily.   Systane Nighttime Oint Apply to both eyes at bedtime   venlafaxine XR 150 MG 24 hr capsule Commonly  known as: EFFEXOR-XR Take 150 mg by mouth daily with breakfast.   Vitamin D 50 MCG (2000 UT) Caps Take 2,000 Units by mouth daily.      ROS was provided with assistance of staff.  Review of Systems  Constitutional: Negative for activity change, appetite change, chills, diaphoresis, fatigue and fever.  HENT: Positive for hearing loss. Negative for congestion and voice change.   Eyes: Positive for redness. Negative for photophobia, pain, discharge, itching and visual disturbance.  Respiratory: Negative for cough and shortness of breath.   Cardiovascular: Negative for leg swelling.  Gastrointestinal: Negative for abdominal distention, abdominal pain, constipation, diarrhea, nausea and vomiting.  Genitourinary: Negative for difficulty urinating, dysuria and urgency.  Musculoskeletal: Positive for gait problem.  Skin: Negative for color change and pallor.  Neurological: Negative for dizziness, speech difficulty, weakness and headaches.       Dementia  Psychiatric/Behavioral: Negative for agitation.    Immunization History  Administered Date(s) Administered  . Influenza Whole 03/27/2018  . Influenza-Unspecified 04/14/2017  . Pneumococcal Conjugate-13 09/24/2017  . Pneumococcal Polysaccharide-23 06/14/2013  . Td 09/25/2017  . Varicella 06/29/2003   Pertinent  Health Maintenance Due  Topic Date Due  . INFLUENZA VACCINE  01/23/2019  . DEXA SCAN  Completed  . PNA vac Low Risk Adult  Completed   Fall Risk  09/19/2017  Falls in the past year? No   Functional Status Survey:    Vitals:   04/28/19 1709  BP: 118/70  Pulse: (!) 118  Resp: (!) 22  Temp: (!) 97.4 F (36.3 C)  SpO2: 95%   There is no height or weight on file to calculate BMI. Physical Exam Vitals signs and nursing note reviewed.  Constitutional:      General: She is not in acute distress.    Appearance: Normal appearance. She is not ill-appearing.  HENT:     Head: Normocephalic and atraumatic.     Nose: Nose  normal.     Mouth/Throat:     Mouth: Mucous membranes are moist.  Eyes:     Extraocular Movements: Extraocular movements intact.     Pupils: Pupils are equal, round, and reactive to light.     Comments: Left upper eyelid stye. Mild injected left conjunctiva, no apparent drainage.   Neck:  Musculoskeletal: Normal range of motion and neck supple.  Cardiovascular:     Rate and Rhythm: Regular rhythm. Tachycardia present.     Heart sounds: No murmur.  Pulmonary:     Breath sounds: No wheezing, rhonchi or rales.  Abdominal:     General: Bowel sounds are normal. There is no distension.     Palpations: Abdomen is soft.     Tenderness: There is no abdominal tenderness. There is no right CVA tenderness, left CVA tenderness, guarding or rebound.  Musculoskeletal:     Right lower leg: No edema.     Left lower leg: No edema.     Comments: Limited left shoulder over head ROM  Skin:    General: Skin is warm and dry.  Neurological:     General: No focal deficit present.     Mental Status: She is alert. Mental status is at baseline.     Motor: No weakness.     Coordination: Coordination normal.     Gait: Gait abnormal.     Comments: Oriented to self.   Psychiatric:        Mood and Affect: Mood normal.        Behavior: Behavior normal.     Labs reviewed: Recent Labs    08/11/18 11/19/18 02/04/19  NA 131* 134* 132*  K 5.0 4.7 4.8  CL  --   --  98  CO2  --   --  27  BUN 14 12 15   CREATININE 0.7 0.7 0.6  CALCIUM  --   --  9.3   Recent Labs    08/11/18 11/19/18  AST 21 25  ALT 18 30  ALKPHOS 82 78   Recent Labs    08/11/18 11/19/18 02/04/19  WBC 5.7 6.3 5.4  HGB 12.3 12.4 12.6  HCT 36 37 37  PLT 282 289 284   Lab Results  Component Value Date   TSH 2.99 11/13/2017   Lab Results  Component Value Date   HGBA1C 5.8 06/29/2013   No results found for: CHOL, HDL, LDLCALC, LDLDIRECT, TRIG, CHOLHDL  Significant Diagnostic Results in last 30 days:  No results found.   Assessment/Plan: Conjunctivitis Will apply Maxitrol tid to the left eye x 5 days, observe.   Hordeolum externum (stye) It should resolve, observe.   Hyponatremia At her baseline last serum Na 132 01/2019  Vascular dementia ([place]) Continue SNF [facility] for safety, care assistance, w/c for mobility, continue Memantine 10mg  bid for memory.   Tachycardia Chronic, asymptomatic presently, continue Metoprolol, observe.     Family/ staff Communication: plan of care reviewed with the patient and charge nurse.   Labs/tests ordered: none  Time spend 25 minutes.

## 2019-04-29 ENCOUNTER — Encounter: Payer: Self-pay | Admitting: Nurse Practitioner

## 2019-04-29 DIAGNOSIS — H00019 Hordeolum externum unspecified eye, unspecified eyelid: Secondary | ICD-10-CM | POA: Insufficient documentation

## 2019-04-29 NOTE — Assessment & Plan Note (Signed)
It should resolve, observe.

## 2019-04-29 NOTE — Assessment & Plan Note (Signed)
At her baseline last serum Na 132 01/2019

## 2019-04-29 NOTE — Assessment & Plan Note (Signed)
Continue SNF [facility] for safety, care assistance, w/c for mobility, continue Memantine 10mg  bid for memory.

## 2019-04-29 NOTE — Assessment & Plan Note (Signed)
Chronic, asymptomatic presently, continue Metoprolol, observe.

## 2019-05-04 DIAGNOSIS — Z20828 Contact with and (suspected) exposure to other viral communicable diseases: Secondary | ICD-10-CM | POA: Diagnosis not present

## 2019-05-10 ENCOUNTER — Other Ambulatory Visit: Payer: Self-pay | Admitting: *Deleted

## 2019-05-10 MED ORDER — LORAZEPAM 0.5 MG PO TABS: 0.2500 mg | ORAL_TABLET | Freq: Every day | ORAL | 0 refills | Status: DC

## 2019-05-10 NOTE — Telephone Encounter (Signed)
Received fax from [facility] Requesting refill Pended Rx and sent to [facility] for approval.

## 2019-05-11 DIAGNOSIS — Z03818 Encounter for observation for suspected exposure to other biological agents ruled out: Secondary | ICD-10-CM | POA: Diagnosis not present

## 2019-05-13 ENCOUNTER — Encounter: Payer: Self-pay | Admitting: Internal Medicine

## 2019-05-13 ENCOUNTER — Non-Acute Institutional Stay (SKILLED_NURSING_FACILITY): Payer: Medicare Other | Admitting: Internal Medicine

## 2019-05-13 DIAGNOSIS — E785 Hyperlipidemia, unspecified: Secondary | ICD-10-CM | POA: Diagnosis not present

## 2019-05-13 DIAGNOSIS — F0150 Vascular dementia without behavioral disturbance: Secondary | ICD-10-CM | POA: Diagnosis not present

## 2019-05-13 DIAGNOSIS — K219 Gastro-esophageal reflux disease without esophagitis: Secondary | ICD-10-CM | POA: Diagnosis not present

## 2019-05-13 DIAGNOSIS — J42 Unspecified chronic bronchitis: Secondary | ICD-10-CM

## 2019-05-13 DIAGNOSIS — F418 Other specified anxiety disorders: Secondary | ICD-10-CM | POA: Diagnosis not present

## 2019-05-13 DIAGNOSIS — I10 Essential (primary) hypertension: Secondary | ICD-10-CM

## 2019-05-13 NOTE — Progress Notes (Signed)
Location:    [facility] Home Room Number: 23 Place of Service:  SNF (31) Provider:  [name], MD  [name], MD  Patient Care Team: [name], MD as PCP - General (Internal Medicine) [name], MD as Referring Physician (Ophthalmology) Mast, Man X, NP as Nurse Practitioner (Internal Medicine)  Extended Emergency Contact Information Primary Emergency Contact: [name] Address: [address]          [place], [place] [address] [name] of [place] Phone: [phone] Mobile Phone: [phone] Relation: Son Secondary Emergency Contact: [name] Address: [address]          [place], [place] [address] [name] of [place] Phone: [phone] Mobile Phone: [phone] Relation: Son  Code Status:  DNR Goals of care: Advanced Directive information Advanced Directives 04/23/2019  Does Patient Have a Medical Advance Directive? Yes  Type of Advance Directive Out of facility DNR (pink MOST or yellow form)  Does patient want to make changes to medical advance directive? No - Patient declined  Copy of [place] in Chart? -  Would patient like information on creating a medical advance directive? -  Pre-existing out of facility DNR order (yellow form or pink MOST form) Pink MOST form placed in chart (order not valid for inpatient use)     Chief Complaint  Patient presents with  . Medical Management of Chronic Issues    HPI:  Pt is a 85 y.o. female seen today for medical management of chronic diseases.   Patient has a history ofHypertension, anemia, depression,Hyperlipidemia,COPD, hyponatremia,Depression with Anxiety and Dementia Patient is a long-term resident of facility.  Patient is stable in the facility and actually doing pretty good She was in a very good mood today and said that she is happy in her room now Per nurses she does sometimes get anxious and tries to leave the facility Her weight is  slightly down but mostly stable Her appetite is good.  She is mostly wheelchair-bound.  She did not have any complaints today  Past Medical History:  Diagnosis Date  . Anemia, iron deficiency    takes Ferrous Sulfate daily  . Depression    takes Effexor daily  . Gait disorder 04/21/2014  . GERD (gastroesophageal reflux disease)    takes Omeprazole daily  . Herpes ocular    history of-takes Acyclovir daily  . High cholesterol    takes Atorvastatin daily  . History of hiatal hernia   . Hypertension    takes Metoprolol daily  . Insomnia    takes Xanax nightly  . Osteoarthritis of knee    bilateral  . Sciatic pain    Past Surgical History:  Procedure Laterality Date  . ABDOMINAL HYSTERECTOMY  1995   partial  . CARPAL TUNNEL RELEASE Right 07/21/2007  . CARPAL TUNNEL RELEASE Left 09/24/2007  . COLONOSCOPY    . DESCEMETS STRIPPING AUTOMATED ENDOTHELIAL KERATOPLASTY Left 03/14/2011  . DESCEMETS STRIPPING AUTOMATED ENDOTHELIAL KERATOPLASTY Right 08/20/2012  . EYE SURGERY Bilateral    cataract surgery  . EYE SURGERY Left    corneal transplant  . HERNIA REPAIR    . HIP ARTHROPLASTY Left 06/13/2013   Procedure: ARTHROPLASTY BIPOLAR HIP; Injection left shoulder;  Surgeon: Johnny Bridge, MD;  Location: [facility];  Service: Orthopedics;  Laterality: Left;  . JOINT REPLACEMENT     bilateral knees, left knee  . KNEE ARTHROSCOPY Right 05/11/2001  . KYPHOPLASTY N/A 04/10/2015   Procedure: T11 Kyphoplasty;  Surgeon: Jovita Gamma,  MD;  Location: [facility];  Service: Neurosurgery;  Laterality: N/A;  T11 Kyphoplasty  . KYPHOPLASTY N/A 05/06/2015   Procedure: KYPHOPLASTY Thoracic twelve;  Surgeon: Jovita Gamma, MD;  Location: [facility];  Service: Neurosurgery;  Laterality: N/A;  T12 Kyphoplasty  . LAPAROSCOPIC NISSEN FUNDOPLICATION  [id number]  . LUMBAR LAMINECTOMY/DECOMPRESSION MICRODISCECTOMY  08/29/2010   L2-S1  . SHOULDER ARTHROSCOPY WITH ROTATOR CUFF REPAIR AND SUBACROMIAL  DECOMPRESSION Right 01/01/2000  . TONSILLECTOMY     as child  . TOTAL HIP REVISION Left 07/08/2014   Procedure: TOTAL HIP REVISION;  Surgeon: Kerin Salen, MD;  Location: [facility];  Service: Orthopedics;  Laterality: Left;  . TOTAL KNEE ARTHROPLASTY Left 05/14/2004  . TOTAL KNEE ARTHROPLASTY  12/23/2011   Procedure: TOTAL KNEE ARTHROPLASTY;  Surgeon: Lorn Junes, MD;  Location: [facility];  Service: Orthopedics;  Laterality: Right;  DR [place] THIS CASE  . TRIGGER FINGER RELEASE Right 07/21/2007   thumb  . TRIGGER FINGER RELEASE Left 09/24/2007   middle finger  . TRIGGER FINGER RELEASE Right 03/10/2008   ring and little fingers  . TRIGGER FINGER RELEASE Right 09/02/2012   Procedure: RELEASE TRIGGER FINGER/A-1 PULLEY RIGHT INDEX FINGER;  Surgeon: Wynonia Sours, MD;  Location: [facility];  Service: Orthopedics;  Laterality: Right;  . TRIGGER FINGER RELEASE Left 12/22/2012   Procedure: RELEASE TRIGGER FINGER/A-1 PULLEY LEFT RING FINGER;  Surgeon: Wynonia Sours, MD;  Location: [facility];  Service: Orthopedics;  Laterality: Left;  Left     Allergies  Allergen Reactions  . Morphine And Related Other (See Comments)    AGITATION, STRANGE DREAMS  . Scopolamine Other (See Comments)    MENTAL CHANGES  . Atorvastatin Other (See Comments)    unknown  . Morphine Other (See Comments)    Disoriented, mood changes  . Nsaids Other (See Comments)    unknown  . Pravachol [Pravastatin Sodium] Other (See Comments)    unknown  . Pravastatin Other (See Comments)    unknown  . Teriparatide Other (See Comments)    unknown    Allergies as of 05/13/2019      Reactions   Morphine And Related Other (See Comments)   AGITATION, STRANGE DREAMS   Scopolamine Other (See Comments)   MENTAL CHANGES   Atorvastatin Other (See Comments)   unknown   Morphine Other (See Comments)   Disoriented, mood changes   Nsaids Other (See Comments)   unknown   Pravachol [pravastatin  Sodium] Other (See Comments)   unknown   Pravastatin Other (See Comments)   unknown   Teriparatide Other (See Comments)   unknown      Medication List       Accurate as of May 13, 2019 10:33 AM. If you have any questions, ask your nurse or doctor.        acetaminophen 500 MG tablet Commonly known as: TYLENOL Take 500 mg by mouth every 6 (six) hours as needed for mild pain or moderate pain. Also give 1 tablet  500 mg tab by mouth at bedtime   Non Aspirin NOT TO EXCEED 3,000 MG ACETAMINOPHEN IN 24 HRS (SCHEDULED + PRN DOSES).   Artificial Tears 0.2-0.2-1 % Soln Generic drug: Glycerin-Hypromellose-PEG 400 Place 1 drop into both eyes 4 (four) times daily.   aspirin EC 81 MG tablet Take 81 mg by mouth. To be given every 5 days.   Breo Ellipta 100-25 MCG/INH Aepb Generic drug: fluticasone furoate-vilanterol Inhale  1 puff into the lungs daily.   docusate sodium 100 MG capsule Commonly known as: COLACE Take 1 capsule by mouth daily as needed for mild constipation.   feeding supplement Liqd Commonly known as: BOOST / RESOURCE BREEZE Take 1 Container by mouth 2 (two) times daily. For weight loss   ferrous sulfate 325 (65 FE) MG tablet Take 325 mg by mouth daily.   LORazepam 0.5 MG tablet Commonly known as: ATIVAN Take 0.5 tablets (0.25 mg total) by mouth at bedtime.   magnesium hydroxide 400 MG/5ML suspension Commonly known as: MILK OF MAGNESIA Take 15 mLs by mouth at bedtime as needed for mild constipation.   memantine 10 MG tablet Commonly known as: NAMENDA Take 10 mg by mouth 2 (two) times daily.   metoprolol tartrate 25 MG tablet Commonly known as: LOPRESSOR Take 25 mg by mouth at bedtime.   METOPROLOL TARTRATE PO Take 50 mg by mouth daily.   nitroGLYCERIN 0.4 MG SL tablet Commonly known as: NITROSTAT Place 0.4 mg under the tongue every 5 (five) minutes as needed for chest pain.   omeprazole 10 MG capsule Commonly known as: PRILOSEC Take 10 mg by mouth  daily.   Systane Nighttime Oint Apply to both eyes at bedtime   venlafaxine XR 150 MG 24 hr capsule Commonly known as: EFFEXOR-XR Take 150 mg by mouth daily with breakfast.   Vitamin D 50 MCG (2000 UT) Caps Take 2,000 Units by mouth daily.       Review of Systems  Review of Systems  Constitutional: Negative for activity change, appetite change, chills, diaphoresis, fatigue and fever.  HENT: Negative for mouth sores, postnasal drip, rhinorrhea, sinus pain and sore throat.   Respiratory: Negative for apnea, cough, chest tightness, shortness of breath and wheezing.   Cardiovascular: Negative for chest pain, palpitations and leg swelling.  Gastrointestinal: Negative for abdominal distention, abdominal pain, constipation, diarrhea, nausea and vomiting.  Genitourinary: Negative for dysuria and frequency.  Musculoskeletal: Negative for arthralgias, joint swelling and myalgias.  Skin: Negative for rash.  Neurological: Negative for dizziness, syncope, weakness, light-headedness and numbness.  Psychiatric/Behavioral: Negative for behavioral problems, confusion and sleep disturbance.     Immunization History  Administered Date(s) Administered  . Influenza Whole 03/27/2018  . Influenza-Unspecified 04/14/2017  . Pneumococcal Conjugate-13 09/24/2017  . Pneumococcal Polysaccharide-23 06/14/2013  . Td 09/25/2017  . Varicella 06/29/2003   Pertinent  Health Maintenance Due  Topic Date Due  . INFLUENZA VACCINE  01/23/2019  . DEXA SCAN  Completed  . PNA vac Low Risk Adult  Completed   Fall Risk  09/19/2017  Falls in the past year? No   Functional Status Survey:    Vitals:   05/13/19 1030  BP: 120/60  Pulse: 67  Resp: 20  Temp: (!) 97.3 F (36.3 C)  SpO2: 95%  Weight: 111 lb 3.2 oz (50.4 kg)  Height: 5\' 1"  (1.549 m)   Body mass index is 21.01 kg/m. Physical Exam Constitutional:. Well-developed and well-nourished.  HENT:  Head: Normocephalic.  Mouth/Throat: Oropharynx is  clear and moist.  Eyes: Pupils are equal, round, and reactive to light.  Neck: Neck supple.  Cardiovascular: Normal rate and normal heart sounds.  No murmur heard. Pulmonary/Chest: Effort normal and breath sounds normal. No respiratory distress. No wheezes. She has no rales.  Abdominal: Soft. Bowel sounds are normal. No distension. There is no tenderness. There is no rebound.  Musculoskeletal: No edema.  Lymphadenopathy: none Neurological: .Has Frozen Left shoulder Good Strength in all other  Extremities Alert Follows Commands Wheelchair dependant  Skin: Skin is warm and dry.  Psychiatric: Normal mood and affect. Behavior is normal. Thought content normal.   Labs reviewed: Recent Labs    11/19/18 02/04/19 04/15/19  NA 134* 132* 132*  K 4.7 4.8 4.3  CL  --  98 97*  CO2  --  27 27*  BUN 12 15 13   CREATININE 0.7 0.6 0.6  CALCIUM  --  9.3 9.0   Recent Labs    08/11/18 11/19/18 04/15/19  AST 21 25 26   ALT 18 30 27   ALKPHOS 82 78 95  ALBUMIN  --   --  4.0   Recent Labs    11/19/18 02/04/19 04/15/19  WBC 6.3 5.4 7.5  HGB 12.4 12.6 12.7  HCT 37 37 37  PLT 289 284 295   Lab Results  Component Value Date   TSH 2.99 11/13/2017   Lab Results  Component Value Date   HGBA1C 5.8 06/29/2013   No results found for: CHOL, HDL, LDLCALC, LDLDIRECT, TRIG, CHOLHDL  Significant Diagnostic Results in last 30 days:  No results found.  Assessment/Plan Essential hypertensionwith Tachycardia On lopressor GERD Doing well on Prilosec Gait disorder Works with therapy on PRN basis Hyponatremia Last Sodium was 132 Iron deficiency anemia, Hgb 12.7 Does not let us change her iron dose Dementia Continues needs had a level of skilled care On [place] Depression Patient has stabilized on Effexor and Ativan Not candidate for GDR as she has failed before H/o COPD On Breo Hyperlipidemia ? Allergic to Statin   Family/ staff Communication:   Labs/tests ordered:    Total time  spent in this patient care encounter was  25_  minutes; greater than 50% of the visit spent counseling patient and staff, reviewing records , Labs and coordinating care for problems addressed at this encounter.

## 2019-06-08 DIAGNOSIS — Z20828 Contact with and (suspected) exposure to other viral communicable diseases: Secondary | ICD-10-CM | POA: Diagnosis not present

## 2019-06-11 ENCOUNTER — Non-Acute Institutional Stay (SKILLED_NURSING_FACILITY): Payer: Medicare Other | Admitting: Nurse Practitioner

## 2019-06-11 ENCOUNTER — Encounter: Payer: Self-pay | Admitting: Nurse Practitioner

## 2019-06-11 DIAGNOSIS — J42 Unspecified chronic bronchitis: Secondary | ICD-10-CM

## 2019-06-11 DIAGNOSIS — E871 Hypo-osmolality and hyponatremia: Secondary | ICD-10-CM

## 2019-06-11 DIAGNOSIS — F339 Major depressive disorder, recurrent, unspecified: Secondary | ICD-10-CM | POA: Diagnosis not present

## 2019-06-11 DIAGNOSIS — D509 Iron deficiency anemia, unspecified: Secondary | ICD-10-CM

## 2019-06-11 DIAGNOSIS — R000 Tachycardia, unspecified: Secondary | ICD-10-CM

## 2019-06-11 DIAGNOSIS — F0150 Vascular dementia without behavioral disturbance: Secondary | ICD-10-CM | POA: Diagnosis not present

## 2019-06-11 DIAGNOSIS — K219 Gastro-esophageal reflux disease without esophagitis: Secondary | ICD-10-CM

## 2019-06-11 NOTE — Progress Notes (Signed)
Location:   [facility] Room Number: 75 Place of Service:  SNF (31) Provider:  Yale Golla NP  [name], MD  Patient Care Team: [name], MD as PCP - General (Internal Medicine) [name], MD as Referring Physician (Ophthalmology) Harlee Pursifull X, NP as Nurse Practitioner (Internal Medicine)  Extended Emergency Contact Information Primary Emergency Contact: [name] Address: [address]          [place], [place] [address] [name] of [place] Phone: [phone] Mobile Phone: [phone] Relation: Son Secondary Emergency Contact: [name] Address: [address]          [place], [place] [address] [name] of [place] Phone: [phone] Mobile Phone: [phone] Relation: Son  Code Status:  DNR Goals of care: Advanced Directive information Advanced Directives 06/11/2019  Does Patient Have a Medical Advance Directive? Yes  Type of Advance Directive Out of facility DNR (pink MOST or yellow form)  Does patient want to make changes to medical advance directive? No - Patient declined  Copy of [place] in Chart? -  Would patient like information on creating a medical advance directive? -  Pre-existing out of facility DNR order (yellow form or pink MOST form) Pink MOST form placed in chart (order not valid for inpatient use)     Chief Complaint  Patient presents with  . Medical Management of Chronic Issues    HPI:  Pt is a 85 y.o. female seen today for medical management of chronic diseases.    The patient resides in SN[facility] for safety, care assistance, w/c for mobility, taking Memantine 10mg  bid for memory. Hx of hyponatremia, Na 132 04/15/19 at her baseline. Her mood is stable, on Effexor 150mg  qd, Lorazepam 0.5mg  qhs. GERD, stable, on Omeprazole 10mg  qd. Heart rate is in control, on Metoprolol 25mg  qd, 50mg  qd. Anemia, stable, taking Fe 325mg  qd.    Past Medical History:    Diagnosis Date  . Anemia, iron deficiency    takes Ferrous Sulfate daily  . Depression    takes Effexor daily  . Gait disorder 04/21/2014  . GERD (gastroesophageal reflux disease)    takes Omeprazole daily  . Herpes ocular    history of-takes Acyclovir daily  . High cholesterol    takes Atorvastatin daily  . History of hiatal hernia   . Hypertension    takes Metoprolol daily  . Insomnia    takes Xanax nightly  . Osteoarthritis of knee    bilateral  . Sciatic pain    Past Surgical History:  Procedure Laterality Date  . ABDOMINAL HYSTERECTOMY  1995   partial  . CARPAL TUNNEL RELEASE Right 07/21/2007  . CARPAL TUNNEL RELEASE Left 09/24/2007  . COLONOSCOPY    . DESCEMETS STRIPPING AUTOMATED ENDOTHELIAL KERATOPLASTY Left 03/14/2011  . DESCEMETS STRIPPING AUTOMATED ENDOTHELIAL KERATOPLASTY Right 08/20/2012  . EYE SURGERY Bilateral    cataract surgery  . EYE SURGERY Left    corneal transplant  . HERNIA REPAIR    . HIP ARTHROPLASTY Left 06/13/2013   Procedure: ARTHROPLASTY BIPOLAR HIP; Injection left shoulder;  Surgeon: Johnny Bridge, MD;  Location: [facility];  Service: Orthopedics;  Laterality: Left;  . JOINT REPLACEMENT     bilateral knees, left knee  . KNEE ARTHROSCOPY Right 05/11/2001  . KYPHOPLASTY N/A 04/10/2015   Procedure: T11 Kyphoplasty;  Surgeon: Jovita Gamma, MD;  Location: [facility];  Service: Neurosurgery;  Laterality: N/A;  T11 Kyphoplasty  . KYPHOPLASTY N/A 05/06/2015  Procedure: KYPHOPLASTY Thoracic twelve;  Surgeon: Jovita Gamma, MD;  Location: [facility];  Service: Neurosurgery;  Laterality: N/A;  T12 Kyphoplasty  . LAPAROSCOPIC NISSEN FUNDOPLICATION  [id number]  . LUMBAR LAMINECTOMY/DECOMPRESSION MICRODISCECTOMY  08/29/2010   L2-S1  . SHOULDER ARTHROSCOPY WITH ROTATOR CUFF REPAIR AND SUBACROMIAL DECOMPRESSION Right 01/01/2000  . TONSILLECTOMY     as child  . TOTAL HIP REVISION Left 07/08/2014   Procedure: TOTAL HIP REVISION;  Surgeon: Kerin Salen,  MD;  Location: [facility];  Service: Orthopedics;  Laterality: Left;  . TOTAL KNEE ARTHROPLASTY Left 05/14/2004  . TOTAL KNEE ARTHROPLASTY  12/23/2011   Procedure: TOTAL KNEE ARTHROPLASTY;  Surgeon: Lorn Junes, MD;  Location: [facility];  Service: Orthopedics;  Laterality: Right;  DR [place] THIS CASE  . TRIGGER FINGER RELEASE Right 07/21/2007   thumb  . TRIGGER FINGER RELEASE Left 09/24/2007   middle finger  . TRIGGER FINGER RELEASE Right 03/10/2008   ring and little fingers  . TRIGGER FINGER RELEASE Right 09/02/2012   Procedure: RELEASE TRIGGER FINGER/A-1 PULLEY RIGHT INDEX FINGER;  Surgeon: Wynonia Sours, MD;  Location: [facility];  Service: Orthopedics;  Laterality: Right;  . TRIGGER FINGER RELEASE Left 12/22/2012   Procedure: RELEASE TRIGGER FINGER/A-1 PULLEY LEFT RING FINGER;  Surgeon: Wynonia Sours, MD;  Location: [facility];  Service: Orthopedics;  Laterality: Left;  Left     Allergies  Allergen Reactions  . Morphine And Related Other (See Comments)    AGITATION, STRANGE DREAMS  . Scopolamine Other (See Comments)    MENTAL CHANGES  . Atorvastatin Other (See Comments)    unknown  . Morphine Other (See Comments)    Disoriented, mood changes  . Nsaids Other (See Comments)    unknown  . Pravachol [Pravastatin Sodium] Other (See Comments)    unknown  . Pravastatin Other (See Comments)    unknown  . Teriparatide Other (See Comments)    unknown    Allergies as of 06/11/2019      Reactions   Morphine And Related Other (See Comments)   AGITATION, STRANGE DREAMS   Scopolamine Other (See Comments)   MENTAL CHANGES   Atorvastatin Other (See Comments)   unknown   Morphine Other (See Comments)   Disoriented, mood changes   Nsaids Other (See Comments)   unknown   Pravachol [pravastatin Sodium] Other (See Comments)   unknown   Pravastatin Other (See Comments)   unknown   Teriparatide Other (See Comments)   unknown      Medication List        Accurate as of June 11, 2019 11:59 PM. If you have any questions, ask your nurse or doctor.        acetaminophen 500 MG tablet Commonly known as: TYLENOL Take 500 mg by mouth every 6 (six) hours as needed for mild pain or moderate pain. Also give 1 tablet  500 mg tab by mouth at bedtime   Non Aspirin NOT TO EXCEED 3,000 MG ACETAMINOPHEN IN 24 HRS (SCHEDULED + PRN DOSES).   Artificial Tears 0.2-0.2-1 % Soln Generic drug: Glycerin-Hypromellose-PEG 400 Place 1 drop into both eyes 4 (four) times daily.   aspirin EC 81 MG tablet Take 81 mg by mouth. To be given every 5 days.   Breo Ellipta 100-25 MCG/INH Aepb Generic drug: fluticasone furoate-vilanterol Inhale 1 puff into the lungs daily.   docusate sodium 100 MG capsule Commonly known as: COLACE Take 1 capsule by mouth  daily as needed for mild constipation.   feeding supplement Liqd Commonly known as: BOOST / RESOURCE BREEZE Take 1 Container by mouth 2 (two) times daily. For weight loss   ferrous sulfate 325 (65 FE) MG tablet Take 325 mg by mouth daily.   LORazepam 0.5 MG tablet Commonly known as: ATIVAN Take 0.5 tablets (0.25 mg total) by mouth at bedtime.   magnesium hydroxide 400 MG/5ML suspension Commonly known as: MILK OF MAGNESIA Take 15 mLs by mouth at bedtime as needed for mild constipation.   memantine 10 MG tablet Commonly known as: NAMENDA Take 10 mg by mouth 2 (two) times daily.   metoprolol tartrate 25 MG tablet Commonly known as: LOPRESSOR Take 25 mg by mouth at bedtime.   METOPROLOL TARTRATE PO Take 50 mg by mouth daily.   nitroGLYCERIN 0.4 MG SL tablet Commonly known as: NITROSTAT Place 0.4 mg under the tongue every 5 (five) minutes as needed for chest pain.   omeprazole 10 MG capsule Commonly known as: PRILOSEC Take 10 mg by mouth daily.   Systane Nighttime Oint Apply to both eyes at bedtime   venlafaxine XR 150 MG 24 hr capsule Commonly known as: EFFEXOR-XR Take 150 mg by mouth  daily with breakfast.   Vitamin D 50 MCG (2000 UT) Caps Take 2,000 Units by mouth daily.      ROS was provided with assistance of staff.  Review of Systems  Constitutional: Negative for activity change, appetite change, chills, diaphoresis, fatigue, fever and unexpected weight change.  HENT: Positive for hearing loss. Negative for congestion and voice change.   Respiratory: Negative for cough, shortness of breath and wheezing.   Cardiovascular: Negative for chest pain, palpitations and leg swelling.  Gastrointestinal: Negative for abdominal distention, abdominal pain, constipation, diarrhea, nausea and vomiting.  Genitourinary: Negative for difficulty urinating, dysuria and urgency.  Musculoskeletal: Positive for gait problem.  Skin: Negative for color change and pallor.  Neurological: Negative for dizziness, facial asymmetry, speech difficulty, weakness and headaches.       Dementia  Psychiatric/Behavioral: Negative for agitation, behavioral problems, hallucinations and sleep disturbance. The patient is not nervous/anxious.     Immunization History  Administered Date(s) Administered  . Influenza Whole 03/27/2018  . Influenza, High Dose Seasonal PF 03/26/2019  . Influenza-Unspecified 04/14/2017  . Pneumococcal Conjugate-13 09/24/2017  . Pneumococcal Polysaccharide-23 06/14/2013  . Td 09/25/2017  . Varicella 06/29/2003   Pertinent  Health Maintenance Due  Topic Date Due  . INFLUENZA VACCINE  Completed  . DEXA SCAN  Completed  . PNA vac Low Risk Adult  Completed   Fall Risk  09/19/2017  Falls in the past year? No   Functional Status Survey:    Vitals:   06/11/19 1422  BP: 130/70  Pulse: 84  Resp: 20  Temp: (!) 97.4 F (36.3 C)  SpO2: 93%  Weight: 111 lb 8 oz (50.6 kg)  Height: 5\' 1"  (1.549 m)   Body mass index is 21.07 kg/m. Physical Exam Vitals and nursing note reviewed.  Constitutional:      General: She is not in acute distress.    Appearance: Normal  appearance. She is not ill-appearing, toxic-appearing or diaphoretic.  HENT:     Head: Normocephalic and atraumatic.     Nose: Nose normal.     Mouth/Throat:     Mouth: Mucous membranes are moist.  Eyes:     Extraocular Movements: Extraocular movements intact.     Conjunctiva/sclera: Conjunctivae normal.     Pupils: Pupils are  equal, round, and reactive to light.  Cardiovascular:     Rate and Rhythm: Normal rate and regular rhythm.     Heart sounds: No murmur.  Pulmonary:     Breath sounds: No wheezing, rhonchi or rales.  Abdominal:     General: Bowel sounds are normal. There is no distension.     Palpations: Abdomen is soft.     Tenderness: There is no abdominal tenderness. There is no right CVA tenderness, left CVA tenderness, guarding or rebound.  Musculoskeletal:     Cervical back: Normal range of motion and neck supple.     Right lower leg: No edema.     Left lower leg: No edema.  Skin:    General: Skin is warm and dry.  Neurological:     General: No focal deficit present.     Mental Status: She is alert. Mental status is at baseline.     Motor: No weakness.     Coordination: Coordination normal.     Gait: Gait abnormal.     Comments: Oriented to self.   Psychiatric:        Mood and Affect: Mood normal.        Behavior: Behavior normal.     Labs reviewed: Recent Labs    11/19/18 0000 02/04/19 0000 04/15/19 0000  NA 134* 132* 132*  K 4.7 4.8 4.3  CL  --  98 97*  CO2  --  27 27*  BUN 12 15 13   CREATININE 0.7 0.6 0.6  CALCIUM  --  9.3 9.0   Recent Labs    08/11/18 0000 11/19/18 0000 04/15/19 0000  AST 21 25 26   ALT 18 30 27   ALKPHOS 82 78 95  ALBUMIN  --   --  4.0   Recent Labs    11/19/18 0000 02/04/19 0000 04/15/19 0000  WBC 6.3 5.4 7.5  HGB 12.4 12.6 12.7  HCT 37 37 37  PLT 289 284 295   Lab Results  Component Value Date   TSH 2.99 11/13/2017   Lab Results  Component Value Date   HGBA1C 5.8 06/29/2013   No results found for: CHOL,  HDL, LDLCALC, LDLDIRECT, TRIG, CHOLHDL  Significant Diagnostic Results in last 30 days:  No results found.  Assessment/Plan GERD (gastroesophageal reflux disease) Stable, continue Omeprazole.   Vascular dementia ([place]) Stable, continue SNF [facility] for safety, care assistance, w/c for mobility, continue Memantine 10mg  bid for memory.   Depression, recurrent ([place]) Stable, continue Effexor, Lorazepam.   Hyponatremia At her baseline, serum Na 132 04/15/19  Iron deficiency anemia Stable, continue Fe daily, Hgb 12.7 04/14/09  Tachycardia Heart rate is in control, continue Metoprolol.   Chronic bronchitis ([place]) Stable, on Breo Ellipta     Family/ staff Communication: plan of care reviewed with the patient and charge nurse.   Labs/tests ordered:  none  Time spend 25 minutes.

## 2019-06-14 ENCOUNTER — Other Ambulatory Visit: Payer: Self-pay | Admitting: *Deleted

## 2019-06-14 ENCOUNTER — Encounter: Payer: Self-pay | Admitting: Nurse Practitioner

## 2019-06-14 MED ORDER — LORAZEPAM 0.5 MG PO TABS: 0.2500 mg | ORAL_TABLET | Freq: Every day | ORAL | 0 refills | Status: DC

## 2019-06-14 NOTE — Assessment & Plan Note (Signed)
Stable, continue SNF [facility] for safety, care assistance, w/c for mobility, continue Memantine 10mg  bid for memory.

## 2019-06-14 NOTE — Assessment & Plan Note (Signed)
Heart rate is in control, continue Metoprolol  

## 2019-06-14 NOTE — Assessment & Plan Note (Signed)
Stable, continue Effexor, Lorazepam.

## 2019-06-14 NOTE — Assessment & Plan Note (Signed)
Stable, continue Omeprazole.  

## 2019-06-14 NOTE — Assessment & Plan Note (Signed)
At her baseline, serum Na 132 04/15/19

## 2019-06-14 NOTE — Telephone Encounter (Signed)
Received Fax refill Request from [facility] and sent to Dr. Lyndel Safe for approval.

## 2019-06-14 NOTE — Assessment & Plan Note (Signed)
Stable, on Breo Ellipta

## 2019-06-14 NOTE — Assessment & Plan Note (Signed)
Stable, continue Fe daily, Hgb 12.7 04/14/09

## 2019-06-15 DIAGNOSIS — Z20828 Contact with and (suspected) exposure to other viral communicable diseases: Secondary | ICD-10-CM | POA: Diagnosis not present

## 2019-06-17 DIAGNOSIS — H00014 Hordeolum externum left upper eyelid: Secondary | ICD-10-CM | POA: Diagnosis not present

## 2019-06-17 DIAGNOSIS — M6281 Muscle weakness (generalized): Secondary | ICD-10-CM | POA: Diagnosis not present

## 2019-06-17 DIAGNOSIS — R000 Tachycardia, unspecified: Secondary | ICD-10-CM | POA: Diagnosis not present

## 2019-06-17 DIAGNOSIS — J1282 Pneumonia due to coronavirus disease 2019: Secondary | ICD-10-CM | POA: Diagnosis not present

## 2019-06-17 DIAGNOSIS — J1289 Other viral pneumonia: Secondary | ICD-10-CM | POA: Diagnosis not present

## 2019-06-17 DIAGNOSIS — F0150 Vascular dementia without behavioral disturbance: Secondary | ICD-10-CM | POA: Diagnosis not present

## 2019-06-17 DIAGNOSIS — N179 Acute kidney failure, unspecified: Secondary | ICD-10-CM | POA: Diagnosis not present

## 2019-06-17 DIAGNOSIS — R5383 Other fatigue: Secondary | ICD-10-CM | POA: Diagnosis not present

## 2019-06-17 DIAGNOSIS — Z9181 History of falling: Secondary | ICD-10-CM | POA: Diagnosis not present

## 2019-06-17 DIAGNOSIS — R531 Weakness: Secondary | ICD-10-CM | POA: Diagnosis not present

## 2019-06-17 DIAGNOSIS — M5416 Radiculopathy, lumbar region: Secondary | ICD-10-CM | POA: Diagnosis not present

## 2019-06-17 DIAGNOSIS — R21 Rash and other nonspecific skin eruption: Secondary | ICD-10-CM | POA: Diagnosis not present

## 2019-06-17 DIAGNOSIS — Z7389 Other problems related to life management difficulty: Secondary | ICD-10-CM | POA: Diagnosis not present

## 2019-06-17 DIAGNOSIS — Z20828 Contact with and (suspected) exposure to other viral communicable diseases: Secondary | ICD-10-CM | POA: Diagnosis not present

## 2019-06-17 DIAGNOSIS — F329 Major depressive disorder, single episode, unspecified: Secondary | ICD-10-CM | POA: Diagnosis not present

## 2019-06-17 DIAGNOSIS — S52025D Nondisplaced fracture of olecranon process without intraarticular extension of left ulna, subsequent encounter for closed fracture with routine healing: Secondary | ICD-10-CM | POA: Diagnosis not present

## 2019-06-17 DIAGNOSIS — E785 Hyperlipidemia, unspecified: Secondary | ICD-10-CM | POA: Diagnosis not present

## 2019-06-17 DIAGNOSIS — K219 Gastro-esophageal reflux disease without esophagitis: Secondary | ICD-10-CM | POA: Diagnosis not present

## 2019-06-17 DIAGNOSIS — L89610 Pressure ulcer of right heel, unstageable: Secondary | ICD-10-CM | POA: Diagnosis not present

## 2019-06-17 DIAGNOSIS — F418 Other specified anxiety disorders: Secondary | ICD-10-CM | POA: Diagnosis not present

## 2019-06-17 DIAGNOSIS — B965 Pseudomonas (aeruginosa) (mallei) (pseudomallei) as the cause of diseases classified elsewhere: Secondary | ICD-10-CM | POA: Diagnosis not present

## 2019-06-17 DIAGNOSIS — F339 Major depressive disorder, recurrent, unspecified: Secondary | ICD-10-CM | POA: Diagnosis not present

## 2019-06-17 DIAGNOSIS — M25552 Pain in left hip: Secondary | ICD-10-CM | POA: Diagnosis not present

## 2019-06-17 DIAGNOSIS — J42 Unspecified chronic bronchitis: Secondary | ICD-10-CM | POA: Diagnosis not present

## 2019-06-17 DIAGNOSIS — R296 Repeated falls: Secondary | ICD-10-CM | POA: Diagnosis not present

## 2019-06-17 DIAGNOSIS — E860 Dehydration: Secondary | ICD-10-CM | POA: Diagnosis not present

## 2019-06-17 DIAGNOSIS — D509 Iron deficiency anemia, unspecified: Secondary | ICD-10-CM | POA: Diagnosis not present

## 2019-06-17 DIAGNOSIS — R1312 Dysphagia, oropharyngeal phase: Secondary | ICD-10-CM | POA: Diagnosis not present

## 2019-06-17 DIAGNOSIS — Z8616 Personal history of COVID-19: Secondary | ICD-10-CM | POA: Diagnosis not present

## 2019-06-17 DIAGNOSIS — R278 Other lack of coordination: Secondary | ICD-10-CM | POA: Diagnosis not present

## 2019-06-17 DIAGNOSIS — L853 Xerosis cutis: Secondary | ICD-10-CM | POA: Diagnosis not present

## 2019-06-17 DIAGNOSIS — R2681 Unsteadiness on feet: Secondary | ICD-10-CM | POA: Diagnosis not present

## 2019-06-17 DIAGNOSIS — G8911 Acute pain due to trauma: Secondary | ICD-10-CM | POA: Diagnosis not present

## 2019-06-17 DIAGNOSIS — U071 COVID-19: Secondary | ICD-10-CM | POA: Diagnosis not present

## 2019-06-17 DIAGNOSIS — E871 Hypo-osmolality and hyponatremia: Secondary | ICD-10-CM | POA: Diagnosis not present

## 2019-06-17 DIAGNOSIS — H04123 Dry eye syndrome of bilateral lacrimal glands: Secondary | ICD-10-CM | POA: Diagnosis not present

## 2019-06-17 DIAGNOSIS — I10 Essential (primary) hypertension: Secondary | ICD-10-CM | POA: Diagnosis not present

## 2019-06-17 DIAGNOSIS — Z20822 Contact with and (suspected) exposure to covid-19: Secondary | ICD-10-CM | POA: Diagnosis not present

## 2019-06-18 ENCOUNTER — Non-Acute Institutional Stay (SKILLED_NURSING_FACILITY): Payer: Medicare Other | Admitting: Internal Medicine

## 2019-06-18 DIAGNOSIS — I10 Essential (primary) hypertension: Secondary | ICD-10-CM

## 2019-06-18 DIAGNOSIS — F0150 Vascular dementia without behavioral disturbance: Secondary | ICD-10-CM | POA: Diagnosis not present

## 2019-06-18 DIAGNOSIS — F339 Major depressive disorder, recurrent, unspecified: Secondary | ICD-10-CM

## 2019-06-18 DIAGNOSIS — U071 COVID-19: Secondary | ICD-10-CM | POA: Diagnosis not present

## 2019-06-18 NOTE — Progress Notes (Signed)
Location: [facility] manager of Service:  SNF (31)  Provider:   Code Status: DNR Goals of Care:  Advanced Directives 06/11/2019  Does Patient Have a Medical Advance Directive? Yes  Type of Advance Directive Out of facility DNR (pink MOST or yellow form)  Does patient want to make changes to medical advance directive? No - Patient declined  Copy of [place] in Chart? -  Would patient like information on creating a medical advance directive? -  Pre-existing out of facility DNR order (yellow form or pink MOST form) Pink MOST form placed in chart (order not valid for inpatient use)     Chief Complaint  Patient presents with  . Acute Visit    HPI: Patient is a 85 y.o. female seen today for an acute visit for Covid Positive  Patient has a history ofHypertension, anemia, depression,Hyperlipidemia,COPD, hyponatremia,Depression with Anxiety and Dementia Patient is a long-term resident of facility.  Patient was seen today as she was Covid Positive during Routine testing of residents. She is asymptomatic Does not have any complains of Fever or SOB. Per nurses they have noticed some Cough. No Change in appetite. No weakness.  Past Medical History:  Diagnosis Date  . Anemia, iron deficiency    takes Ferrous Sulfate daily  . Depression    takes Effexor daily  . Gait disorder 04/21/2014  . GERD (gastroesophageal reflux disease)    takes Omeprazole daily  . Herpes ocular    history of-takes Acyclovir daily  . High cholesterol    takes Atorvastatin daily  . History of hiatal hernia   . Hypertension    takes Metoprolol daily  . Insomnia    takes Xanax nightly  . Osteoarthritis of knee    bilateral  . Sciatic pain     Past Surgical History:  Procedure Laterality Date  . ABDOMINAL HYSTERECTOMY  1995   partial  . CARPAL TUNNEL RELEASE Right 07/21/2007  . CARPAL TUNNEL RELEASE Left 09/24/2007  . COLONOSCOPY    . DESCEMETS STRIPPING  AUTOMATED ENDOTHELIAL KERATOPLASTY Left 03/14/2011  . DESCEMETS STRIPPING AUTOMATED ENDOTHELIAL KERATOPLASTY Right 08/20/2012  . EYE SURGERY Bilateral    cataract surgery  . EYE SURGERY Left    corneal transplant  . HERNIA REPAIR    . HIP ARTHROPLASTY Left 06/13/2013   Procedure: ARTHROPLASTY BIPOLAR HIP; Injection left shoulder;  Surgeon: Johnny Bridge, MD;  Location: [facility];  Service: Orthopedics;  Laterality: Left;  . JOINT REPLACEMENT     bilateral knees, left knee  . KNEE ARTHROSCOPY Right 05/11/2001  . KYPHOPLASTY N/A 04/10/2015   Procedure: T11 Kyphoplasty;  Surgeon: Jovita Gamma, MD;  Location: [place] [facility];  Service: Neurosurgery;  Laterality: N/A;  T11 Kyphoplasty  . KYPHOPLASTY N/A 05/06/2015   Procedure: KYPHOPLASTY Thoracic twelve;  Surgeon: Jovita Gamma, MD;  Location: [facility];  Service: Neurosurgery;  Laterality: N/A;  T12 Kyphoplasty  . LAPAROSCOPIC NISSEN FUNDOPLICATION  [id number]  . LUMBAR LAMINECTOMY/DECOMPRESSION MICRODISCECTOMY  08/29/2010   L2-S1  . SHOULDER ARTHROSCOPY WITH ROTATOR CUFF REPAIR AND SUBACROMIAL DECOMPRESSION Right 01/01/2000  . TONSILLECTOMY     as child  . TOTAL HIP REVISION Left 07/08/2014   Procedure: TOTAL HIP REVISION;  Surgeon: Kerin Salen, MD;  Location: [facility];  Service: Orthopedics;  Laterality: Left;  . TOTAL KNEE ARTHROPLASTY Left 05/14/2004  . TOTAL KNEE ARTHROPLASTY  12/23/2011   Procedure: TOTAL KNEE ARTHROPLASTY;  Surgeon: Lorn Junes, MD;  Location: [place];  Service: Orthopedics;  Laterality: Right;  DR [place] THIS CASE  . TRIGGER FINGER RELEASE Right 07/21/2007   thumb  . TRIGGER FINGER RELEASE Left 09/24/2007   middle finger  . TRIGGER FINGER RELEASE Right 03/10/2008   ring and little fingers  . TRIGGER FINGER RELEASE Right 09/02/2012   Procedure: RELEASE TRIGGER FINGER/A-1 PULLEY RIGHT INDEX FINGER;  Surgeon: [place] Sours, MD;  Location: [place];  Service: Orthopedics;  Laterality:  Right;  . TRIGGER FINGER RELEASE Left 12/22/2012   Procedure: RELEASE TRIGGER FINGER/A-1 PULLEY LEFT RING FINGER;  Surgeon: [place] Sours, MD;  Location: [place];  Service: Orthopedics;  Laterality: Left;  Left     Allergies  Allergen Reactions  . Morphine And Related Other (See Comments)    AGITATION, STRANGE DREAMS  . Scopolamine Other (See Comments)    MENTAL CHANGES  . Atorvastatin Other (See Comments)    unknown  . Morphine Other (See Comments)    Disoriented, mood changes  . Nsaids Other (See Comments)    unknown  . Pravachol [Pravastatin Sodium] Other (See Comments)    unknown  . Pravastatin Other (See Comments)    unknown  . Teriparatide Other (See Comments)    unknown    Outpatient Encounter Medications as of 06/18/2019  Medication Sig  . acetaminophen (TYLENOL) 500 MG tablet Take 500 mg by mouth every 6 (six) hours as needed for mild pain or moderate pain. Also give 1 tablet  500 mg tab by mouth at bedtime   Non Aspirin NOT TO EXCEED 3,000 MG ACETAMINOPHEN IN 24 HRS (SCHEDULED + PRN DOSES).  Marland Kitchen aspirin EC 81 MG tablet Take 81 mg by mouth. To be given every 5 days.  . Cholecalciferol (VITAMIN D) 2000 units CAPS Take 2,000 Units by mouth daily.  Marland Kitchen docusate sodium (COLACE) 100 MG capsule Take 1 capsule by mouth daily as needed for mild constipation.   . feeding supplement (BOOST / RESOURCE BREEZE) LIQD Take 1 Container by mouth 2 (two) times daily. For weight loss   . ferrous sulfate 325 (65 FE) MG tablet Take 325 mg by mouth daily.  . fluticasone furoate-vilanterol (BREO ELLIPTA) 100-25 MCG/INH AEPB Inhale 1 puff into the lungs daily.  . Glycerin-Hypromellose-PEG 400 (ARTIFICIAL TEARS) 0.2-0.2-1 % SOLN Place 1 drop into both eyes 4 (four) times daily.  Marland Kitchen LORazepam (ATIVAN) 0.5 MG tablet Take 0.5 tablets (0.25 mg total) by mouth at bedtime.  . magnesium hydroxide (MILK OF MAGNESIA) 400 MG/5ML suspension Take 15 mLs by mouth at bedtime as needed for mild  constipation.   . memantine (NAMENDA) 10 MG tablet Take 10 mg by mouth 2 (two) times daily.  . metoprolol tartrate (LOPRESSOR) 25 MG tablet Take 25 mg by mouth at bedtime.   Marland Kitchen METOPROLOL TARTRATE PO Take 50 mg by mouth daily.  . nitroGLYCERIN (NITROSTAT) 0.4 MG SL tablet Place 0.4 mg under the tongue every 5 (five) minutes as needed for chest pain.  Marland Kitchen omeprazole (PRILOSEC) 10 MG capsule Take 10 mg by mouth daily.  Marland Kitchen venlafaxine XR (EFFEXOR-XR) 150 MG 24 hr capsule Take 150 mg by mouth daily with breakfast.  . White Petrolatum-Mineral Oil (SYSTANE NIGHTTIME) OINT Apply to both eyes at bedtime   No facility-administered encounter medications on file as of 06/18/2019.    Review of Systems:  Review of Systems  Review of Systems  Constitutional: Negative for activity change, appetite change, chills, diaphoresis, fatigue and fever.  HENT: Negative  for mouth sores, postnasal drip, rhinorrhea, sinus pain and sore throat.   Respiratory: Negative for apnea, cough, chest tightness, shortness of breath and wheezing.   Cardiovascular: Negative for chest pain, palpitations and leg swelling.  Gastrointestinal: Negative for abdominal distention, abdominal pain, constipation, diarrhea, nausea and vomiting.  Genitourinary: Negative for dysuria and frequency.  Musculoskeletal: Negative for arthralgias, joint swelling and myalgias.  Skin: Negative for rash.  Neurological: Negative for dizziness, syncope, weakness, light-headedness and numbness.  Psychiatric/Behavioral: Negative for behavioral problems, confusion and sleep disturbance.     Health Maintenance  Topic Date Due  . TETANUS/TDAP  09/26/2027  . INFLUENZA VACCINE  Completed  . DEXA SCAN  Completed  . PNA vac Low Risk Adult  Completed    Physical Exam: There were no vitals filed for this visit. There is no height or weight on file to calculate BMI. Physical Exam  Constitutional: . Well-developed and well-nourished.  HENT:  Head:  Normocephalic.  Mouth/Throat: Oropharynx is clear and moist.  Eyes: Pupils are equal, round, and reactive to light.  Neck: Neck supple.  Cardiovascular: Normal rate and normal heart sounds.  No murmur heard. Pulmonary/Chest: Effort normal and breath sounds normal. No respiratory distress. No wheezes. She has no rales.  Abdominal: Soft. Bowel sounds are normal. No distension. There is no tenderness. There is no rebound.  Musculoskeletal: No edema.  Lymphadenopathy: none Neurological:  Has Frozen Left shoulder Good Strength in all other Extremities Alert Follows Commands Wheelchair dependant   Skin: Skin is warm and dry.  Psychiatric: Normal mood and affect. Behavior is normal. Thought content normal.    Labs reviewed: Basic Metabolic Panel: Recent Labs    11/19/18 0000 02/04/19 0000 04/15/19 0000  NA 134* 132* 132*  K 4.7 4.8 4.3  CL  --  98 97*  CO2  --  27 27*  BUN 12 15 13   CREATININE 0.7 0.6 0.6  CALCIUM  --  9.3 9.0   Liver Function Tests: Recent Labs    08/11/18 0000 11/19/18 0000 04/15/19 0000  AST 21 25 26   ALT 18 30 27   ALKPHOS 82 78 95  ALBUMIN  --   --  4.0   No results for input(s): LIPASE, AMYLASE in the last 8760 hours. No results for input(s): AMMONIA in the last 8760 hours. CBC: Recent Labs    11/19/18 0000 02/04/19 0000 04/15/19 0000  WBC 6.3 5.4 7.5  HGB 12.4 12.6 12.7  HCT 37 37 37  PLT 289 284 295   Lipid Panel: No results for input(s): CHOL, HDL, LDLCALC, TRIG, CHOLHDL, LDLDIRECT in the last 8760 hours. Lab Results  Component Value Date   HGBA1C 5.8 06/29/2013    Procedures since last visit: No results found.  Assessment/Plan  Covid Positve on Routine testing No Symptoms so far Is now move to separate units Ordered labs BMP,CBC and Ferritin for Mon. Will continue to monitor Temp and POX. Started on Zinc and Vit C Already on Vit D Have d/w the family they want her to be comfortable with no Hospital Transfer  Other  issues are stable  Essential hypertensionwith Tachycardia On lopressor GERD Doing well on Prilosec Gait disorder Workswith therapy on PRN basis Hyponatremia Last Sodium was 132 Iron deficiency anemia, Hgb 12.7 Does not let us change her iron dose Dementia Continues needs had a level of skilled care On Namenda Depression Patient has stabilized on Effexor and Ativan Not candidate for GDR as she has failed before H/o COPD On Breo Hyperlipidemia ?  Allergic to Statin    Labs/tests ordered:  * No order type specified * Next appt:  Visit date not found

## 2019-06-19 ENCOUNTER — Encounter: Payer: Self-pay | Admitting: Internal Medicine

## 2019-06-19 LAB — CBC AND DIFFERENTIAL
HCT: 41 (ref 36–46)
Hemoglobin: 13.8 (ref 12.0–16.0)
Platelets: 234 (ref 150–399)
WBC: 5

## 2019-06-19 LAB — CBC: RBC: 4.41 (ref 3.87–5.11)

## 2019-06-21 ENCOUNTER — Encounter: Payer: Self-pay | Admitting: Internal Medicine

## 2019-06-21 ENCOUNTER — Non-Acute Institutional Stay (SKILLED_NURSING_FACILITY): Payer: Medicare Other | Admitting: Internal Medicine

## 2019-06-21 DIAGNOSIS — I10 Essential (primary) hypertension: Secondary | ICD-10-CM | POA: Diagnosis not present

## 2019-06-21 DIAGNOSIS — U071 COVID-19: Secondary | ICD-10-CM

## 2019-06-21 DIAGNOSIS — J1289 Other viral pneumonia: Secondary | ICD-10-CM

## 2019-06-21 NOTE — Progress Notes (Signed)
Location: [facility] Room Number: [id number] Place of Service:  SNF (31)  Provider:   Code Status:  Goals of Care:  Advanced Directives 06/11/2019  Does Patient Have a Medical Advance Directive? Yes  Type of Advance Directive Out of facility DNR (pink MOST or yellow form)  Does patient want to make changes to medical advance directive? No - Patient declined  Copy of [place] in Chart? -  Would patient like information on creating a medical advance directive? -  Pre-existing out of facility DNR order (yellow form or pink MOST form) Pink MOST form placed in chart (order not valid for inpatient use)     Chief Complaint  Patient presents with  . Acute Visit    HPI: Patient is a 85 y.o. female seen today for an acute visit for Covid Pneumonia Patient has a history ofHypertension, anemia, depression,Hyperlipidemia,COPD, hyponatremia,Depression with Anxiety and Dementia Patient is a long-term resident of facility.  She has tested Positive for Covid in routine Screening Over the weekend patient became hypoxic requiring Oxygen. Chest xray now shows Left Lower Lobe infiltrate.She was started on Dexamethasone. She has not spiked any temp but is lethargic and Sleepy. Appetite is fair. Doe shave cough   Past Medical History:  Diagnosis Date  . Anemia, iron deficiency    takes Ferrous Sulfate daily  . Depression    takes Effexor daily  . Gait disorder 04/21/2014  . GERD (gastroesophageal reflux disease)    takes Omeprazole daily  . Herpes ocular    history of-takes Acyclovir daily  . High cholesterol    takes Atorvastatin daily  . History of hiatal hernia   . Hypertension    takes Metoprolol daily  . Insomnia    takes Xanax nightly  . Osteoarthritis of knee    bilateral  . Sciatic pain     Past Surgical History:  Procedure Laterality Date  . ABDOMINAL HYSTERECTOMY  1995   partial  . CARPAL TUNNEL RELEASE Right 07/21/2007  .  CARPAL TUNNEL RELEASE Left 09/24/2007  . COLONOSCOPY    . DESCEMETS STRIPPING AUTOMATED ENDOTHELIAL KERATOPLASTY Left 03/14/2011  . DESCEMETS STRIPPING AUTOMATED ENDOTHELIAL KERATOPLASTY Right 08/20/2012  . EYE SURGERY Bilateral    cataract surgery  . EYE SURGERY Left    corneal transplant  . HERNIA REPAIR    . HIP ARTHROPLASTY Left 06/13/2013   Procedure: ARTHROPLASTY BIPOLAR HIP; Injection left shoulder;  Surgeon: Johnny Bridge, MD;  Location: [facility];  Service: Orthopedics;  Laterality: Left;  . JOINT REPLACEMENT     bilateral knees, left knee  . KNEE ARTHROSCOPY Right 05/11/2001  . KYPHOPLASTY N/A 04/10/2015   Procedure: T11 Kyphoplasty;  Surgeon: Jovita Gamma, MD;  Location: [place] [facility];  Service: Neurosurgery;  Laterality: N/A;  T11 Kyphoplasty  . KYPHOPLASTY N/A 05/06/2015   Procedure: KYPHOPLASTY Thoracic twelve;  Surgeon: Jovita Gamma, MD;  Location: [facility];  Service: Neurosurgery;  Laterality: N/A;  T12 Kyphoplasty  . LAPAROSCOPIC NISSEN FUNDOPLICATION  [id number]  . LUMBAR LAMINECTOMY/DECOMPRESSION MICRODISCECTOMY  08/29/2010   L2-S1  . SHOULDER ARTHROSCOPY WITH ROTATOR CUFF REPAIR AND SUBACROMIAL DECOMPRESSION Right 01/01/2000  . TONSILLECTOMY     as child  . TOTAL HIP REVISION Left 07/08/2014   Procedure: TOTAL HIP REVISION;  Surgeon: Kerin Salen, MD;  Location: [facility];  Service: Orthopedics;  Laterality: Left;  . TOTAL KNEE ARTHROPLASTY Left 05/14/2004  . TOTAL KNEE ARTHROPLASTY  12/23/2011   Procedure: TOTAL KNEE ARTHROPLASTY;  Surgeon: Lorn Junes, MD;  Location: [place];  Service: Orthopedics;  Laterality: Right;  DR [place] THIS CASE  . TRIGGER FINGER RELEASE Right 07/21/2007   thumb  . TRIGGER FINGER RELEASE Left 09/24/2007   middle finger  . TRIGGER FINGER RELEASE Right 03/10/2008   ring and little fingers  . TRIGGER FINGER RELEASE Right 09/02/2012   Procedure: RELEASE TRIGGER FINGER/A-1 PULLEY RIGHT INDEX FINGER;  Surgeon: [place] Sours, MD;   Location: [facility];  Service: Orthopedics;  Laterality: Right;  . TRIGGER FINGER RELEASE Left 12/22/2012   Procedure: RELEASE TRIGGER FINGER/A-1 PULLEY LEFT RING FINGER;  Surgeon: [place] Sours, MD;  Location: [facility] [facility];  Service: Orthopedics;  Laterality: Left;  Left     Allergies  Allergen Reactions  . Morphine And Related Other (See Comments)    AGITATION, STRANGE DREAMS  . Scopolamine Other (See Comments)    MENTAL CHANGES  . Atorvastatin Other (See Comments)    unknown  . Morphine Other (See Comments)    Disoriented, mood changes  . Nsaids Other (See Comments)    unknown  . Pravachol [Pravastatin Sodium] Other (See Comments)    unknown  . Pravastatin Other (See Comments)    unknown  . Teriparatide Other (See Comments)    unknown    Outpatient Encounter Medications as of 06/21/2019  Medication Sig  . acetaminophen (TYLENOL) 500 MG tablet Take 500 mg by mouth every 6 (six) hours as needed for mild pain or moderate pain. Also give 1 tablet  500 mg tab by mouth at bedtime   Non Aspirin NOT TO EXCEED 3,000 MG ACETAMINOPHEN IN 24 HRS (SCHEDULED + PRN DOSES).  Marland Kitchen aspirin EC 81 MG tablet Take 81 mg by mouth. To be given every 5 days.  . Cholecalciferol (VITAMIN D) 2000 units CAPS Take 2,000 Units by mouth daily.  Marland Kitchen dexamethasone (DECADRON) 6 MG tablet Take 6 mg by mouth daily.  Marland Kitchen docusate sodium (COLACE) 100 MG capsule Take 1 capsule by mouth daily as needed for mild constipation.   . feeding supplement (BOOST / RESOURCE BREEZE) LIQD Take 1 Container by mouth 2 (two) times daily. For weight loss   . ferrous sulfate 325 (65 FE) MG tablet Take 325 mg by mouth daily.  . fluticasone furoate-vilanterol (BREO ELLIPTA) 100-25 MCG/INH AEPB Inhale 1 puff into the lungs daily.  . Glycerin-Hypromellose-PEG 400 (ARTIFICIAL TEARS) 0.2-0.2-1 % SOLN Place 1 drop into both eyes 4 (four) times daily.  Marland Kitchen LORazepam (ATIVAN) 0.5 MG tablet Take 0.5 tablets (0.25 mg total)  by mouth at bedtime.  . magnesium hydroxide (MILK OF MAGNESIA) 400 MG/5ML suspension Take 15 mLs by mouth at bedtime as needed for mild constipation.   . memantine (NAMENDA) 10 MG tablet Take 10 mg by mouth 2 (two) times daily.  . metoprolol tartrate (LOPRESSOR) 25 MG tablet Take 25 mg by mouth at bedtime.   Marland Kitchen METOPROLOL TARTRATE PO Take 50 mg by mouth daily.  . nitroGLYCERIN (NITROSTAT) 0.4 MG SL tablet Place 0.4 mg under the tongue every 5 (five) minutes as needed for chest pain.  Marland Kitchen omeprazole (PRILOSEC) 10 MG capsule Take 10 mg by mouth daily.  . OXYGEN Inhale into the lungs. @@2L  TO KEEP SAT'S GREATER THAN OR EQUAL 88-90% Every Shift  . venlafaxine XR (EFFEXOR-XR) 150 MG 24 hr capsule Take 150 mg by mouth daily with breakfast.  . White Petrolatum-Mineral Oil (SYSTANE NIGHTTIME) OINT Apply to both eyes at bedtime  .  zinc sulfate 220 (50 Zn) MG capsule Take 220 mg by mouth daily.   No facility-administered encounter medications on file as of 06/21/2019.    Review of Systems:  Review of Systems  Unable to perform ROS: Dementia    Health Maintenance  Topic Date Due  . TETANUS/TDAP  09/26/2027  . INFLUENZA VACCINE  Completed  . DEXA SCAN  Completed  . PNA vac Low Risk Adult  Completed    Physical Exam: Vitals:   06/21/19 1453  BP: (!) 158/76  Pulse: 74  Resp: 20  Temp: (!) 97.3 F (36.3 C)  SpO2: 95%  Weight: 111 lb 8 oz (50.6 kg)  Height: 5\' 1"  (1.549 m)   Body mass index is 21.07 kg/m. Physical Exam  Constitutional: Well-developed and well-nourished.  HENT:  Head: Normocephalic.  Mouth/Throat: Oropharynx is clear and moist.  Eyes: Pupils are equal, round, and reactive to light.  Neck: Neck supple.  Cardiovascular: Normal rate and normal heart sounds.  No murmur heard. Pulmonary/Chest: Effort normal and breath sounds normal. No respiratory distress. No wheezes. Has Bilateral Rales  Abdominal: Soft. Bowel sounds are normal. No distension. There is no tenderness.  There is no rebound.  Musculoskeletal: No edema.  Lymphadenopathy: none Neurological: No Focal deficit. Mental status at baseline Skin: Skin is warm and dry.  Psychiatric: Normal mood and affect. Behavior is normal. Thought content normal.    Labs reviewed: Basic Metabolic Panel: Recent Labs    11/19/18 0000 02/04/19 0000 04/15/19 0000  NA 134* 132* 132*  K 4.7 4.8 4.3  CL  --  98 97*  CO2  --  27 27*  BUN 12 15 13   CREATININE 0.7 0.6 0.6  CALCIUM  --  9.3 9.0   Liver Function Tests: Recent Labs    08/11/18 0000 11/19/18 0000 04/15/19 0000  AST 21 25 26   ALT 18 30 27   ALKPHOS 82 78 95  ALBUMIN  --   --  4.0   No results for input(s): LIPASE, AMYLASE in the last 8760 hours. No results for input(s): AMMONIA in the last 8760 hours. CBC: Recent Labs    02/04/19 0000 04/15/19 0000 06/19/19 0000  WBC 5.4 7.5 5.0  HGB 12.6 12.7 13.8  HCT 37 37 41  PLT 284 295 234   Lipid Panel: No results for input(s): CHOL, HDL, LDLCALC, TRIG, CHOLHDL, LDLDIRECT in the last 8760 hours. Lab Results  Component Value Date   HGBA1C 5.8 06/29/2013    Procedures since last visit: No results found.  Assessment/Plan 1. Pneumonia due to COVID-19 virus Chest Xray Positive for Patchy Positive in Left Lower Lobe Started on Dexamethasone for Low POX Now her Pox is Above 90 % on RA Will also start on Doxycyline 7 days Have d/w the family they want her to be comfortable with no Hospital Transfer   2.Decrased Appetite  BUN and Creat are pending Encourage PO fluids   3. Essential hypertension On Lopressor  Other issues GERD Doing well on Prilosec Gait disorder Workswith therapy on PRN basis Hyponatremia Last Sodium was 132 Iron deficiency anemia, Hgb 12.7 Does not let us change her iron dose Dementia Continues needs had a level of skilled care On Namenda Depression Patient has stabilized on Effexor and Ativan Not candidate for GDR as she has failed before H/o  COPD On Breo Hyperlipidemia ? Allergic to Statin Labs/tests ordered:  * No order type specified * Next appt:  Visit date not found

## 2019-06-21 NOTE — Progress Notes (Signed)
Location:    [facility] Room Number: [place]:  SNF [phone]) Provider:  [name], MD  [name], MD  Patient Care Team: [name], MD as PCP - General (Internal Medicine) [name], MD as Referring Physician (Ophthalmology) Mast, Man X, NP as Nurse Practitioner (Internal Medicine)  Extended Emergency Contact Information Primary Emergency Contact: [name] Address: [address]          [place],  [address] [name] of [place] Phone: [phone] Mobile Phone: [phone] Relation: Son Secondary Emergency Contact: [name] Address: [address]          [place], [place] [address] [name] of [place] Phone: [phone] Mobile Phone: [phone] Relation: Son  Code Status:  DNR Goals of care: Advanced Directive information Advanced Directives 06/11/2019  Does Patient Have a Medical Advance Directive? Yes  Type of Advance Directive Out of facility DNR (pink MOST or yellow form)  Does patient want to make changes to medical advance directive? No - Patient declined  Copy of [place] in Chart? -  Would patient like information on creating a medical advance directive? -  Pre-existing out of facility DNR order (yellow form or pink MOST form) Pink MOST form placed in chart (order not valid for inpatient use)     Chief Complaint  Patient presents with  . Acute Visit    HPI:  Pt is a 85 y.o. female seen today for an acute visit for    Past Medical History:  Diagnosis Date  . Anemia, iron deficiency    takes Ferrous Sulfate daily  . Depression    takes Effexor daily  . Gait disorder 04/21/2014  . GERD (gastroesophageal reflux disease)    takes Omeprazole daily  . Herpes ocular    history of-takes Acyclovir daily  . High cholesterol    takes Atorvastatin daily  . History of hiatal hernia   . Hypertension    takes Metoprolol daily  . Insomnia      takes Xanax nightly  . Osteoarthritis of knee    bilateral  . Sciatic pain    Past Surgical History:  Procedure Laterality Date  . ABDOMINAL HYSTERECTOMY  1995   partial  . CARPAL TUNNEL RELEASE Right 07/21/2007  . CARPAL TUNNEL RELEASE Left 09/24/2007  . COLONOSCOPY    . DESCEMETS STRIPPING AUTOMATED ENDOTHELIAL KERATOPLASTY Left 03/14/2011  . DESCEMETS STRIPPING AUTOMATED ENDOTHELIAL KERATOPLASTY Right 08/20/2012  . EYE SURGERY Bilateral    cataract surgery  . EYE SURGERY Left    corneal transplant  . HERNIA REPAIR    . HIP ARTHROPLASTY Left 06/13/2013   Procedure: ARTHROPLASTY BIPOLAR HIP; Injection left shoulder;  Surgeon: Johnny Bridge, MD;  Location: [facility];  Service: Orthopedics;  Laterality: Left;  . JOINT REPLACEMENT     bilateral knees, left knee  . KNEE ARTHROSCOPY Right 05/11/2001  . KYPHOPLASTY N/A 04/10/2015   Procedure: T11 Kyphoplasty;  Surgeon: Jovita Gamma, MD;  Location: [facility];  Service: Neurosurgery;  Laterality: N/A;  T11 Kyphoplasty  . KYPHOPLASTY N/A 05/06/2015   Procedure: KYPHOPLASTY Thoracic twelve;  Surgeon: Jovita Gamma, MD;  Location: [facility];  Service: Neurosurgery;  Laterality: N/A;  T12 Kyphoplasty  . LAPAROSCOPIC NISSEN FUNDOPLICATION  [id number]  . LUMBAR LAMINECTOMY/DECOMPRESSION MICRODISCECTOMY  08/29/2010   L2-S1  . SHOULDER ARTHROSCOPY WITH ROTATOR CUFF REPAIR AND SUBACROMIAL DECOMPRESSION Right 01/01/2000  . TONSILLECTOMY     as child  .  TOTAL HIP REVISION Left 07/08/2014   Procedure: TOTAL HIP REVISION;  Surgeon: Kerin Salen, MD;  Location: [facility];  Service: Orthopedics;  Laterality: Left;  . TOTAL KNEE ARTHROPLASTY Left 05/14/2004  . TOTAL KNEE ARTHROPLASTY  12/23/2011   Procedure: TOTAL KNEE ARTHROPLASTY;  Surgeon: Lorn Junes, MD;  Location: [facility];  Service: Orthopedics;  Laterality: Right;  DR [place] THIS CASE  . TRIGGER FINGER RELEASE Right 07/21/2007   thumb  . TRIGGER FINGER RELEASE Left  09/24/2007   middle finger  . TRIGGER FINGER RELEASE Right 03/10/2008   ring and little fingers  . TRIGGER FINGER RELEASE Right 09/02/2012   Procedure: RELEASE TRIGGER FINGER/A-1 PULLEY RIGHT INDEX FINGER;  Surgeon: Wynonia Sours, MD;  Location: [facility];  Service: Orthopedics;  Laterality: Right;  . TRIGGER FINGER RELEASE Left 12/22/2012   Procedure: RELEASE TRIGGER FINGER/A-1 PULLEY LEFT RING FINGER;  Surgeon: Wynonia Sours, MD;  Location: [facility];  Service: Orthopedics;  Laterality: Left;  Left     Allergies  Allergen Reactions  . Morphine And Related Other (See Comments)    AGITATION, STRANGE DREAMS  . Scopolamine Other (See Comments)    MENTAL CHANGES  . Atorvastatin Other (See Comments)    unknown  . Morphine Other (See Comments)    Disoriented, mood changes  . Nsaids Other (See Comments)    unknown  . Pravachol [Pravastatin Sodium] Other (See Comments)    unknown  . Pravastatin Other (See Comments)    unknown  . Teriparatide Other (See Comments)    unknown    Allergies as of 06/21/2019      Reactions   Morphine And Related Other (See Comments)   AGITATION, STRANGE DREAMS   Scopolamine Other (See Comments)   MENTAL CHANGES   Atorvastatin Other (See Comments)   unknown   Morphine Other (See Comments)   Disoriented, mood changes   Nsaids Other (See Comments)   unknown   Pravachol [pravastatin Sodium] Other (See Comments)   unknown   Pravastatin Other (See Comments)   unknown   Teriparatide Other (See Comments)   unknown      Medication List       Accurate as of June 21, 2019  3:16 PM. If you have any questions, ask your nurse or doctor.        acetaminophen 500 MG tablet Commonly known as: TYLENOL Take 500 mg by mouth every 6 (six) hours as needed for mild pain or moderate pain. Also give 1 tablet  500 mg tab by mouth at bedtime   Non Aspirin NOT TO EXCEED 3,000 MG ACETAMINOPHEN IN 24 HRS (SCHEDULED + PRN DOSES).    Artificial Tears 0.2-0.2-1 % Soln Generic drug: Glycerin-Hypromellose-PEG 400 Place 1 drop into both eyes 4 (four) times daily.   aspirin EC 81 MG tablet Take 81 mg by mouth. To be given every 5 days.   Breo Ellipta 100-25 MCG/INH Aepb Generic drug: fluticasone furoate-vilanterol Inhale 1 puff into the lungs daily.   dexamethasone 6 MG tablet Commonly known as: DECADRON Take 6 mg by mouth daily.   docusate sodium 100 MG capsule Commonly known as: COLACE Take 1 capsule by mouth daily as needed for mild constipation.   feeding supplement Liqd Commonly known as: BOOST / RESOURCE BREEZE Take 1 Container by mouth 2 (two) times daily. For weight loss   ferrous sulfate 325 (65 FE) MG tablet Take 325 mg by mouth daily.  LORazepam 0.5 MG tablet Commonly known as: ATIVAN Take 0.5 tablets (0.25 mg total) by mouth at bedtime.   magnesium hydroxide 400 MG/5ML suspension Commonly known as: MILK OF MAGNESIA Take 15 mLs by mouth at bedtime as needed for mild constipation.   memantine 10 MG tablet Commonly known as: NAMENDA Take 10 mg by mouth 2 (two) times daily.   metoprolol tartrate 25 MG tablet Commonly known as: LOPRESSOR Take 25 mg by mouth at bedtime.   METOPROLOL TARTRATE PO Take 50 mg by mouth daily.   nitroGLYCERIN 0.4 MG SL tablet Commonly known as: NITROSTAT Place 0.4 mg under the tongue every 5 (five) minutes as needed for chest pain.   omeprazole 10 MG capsule Commonly known as: PRILOSEC Take 10 mg by mouth daily.   OXYGEN Inhale into the lungs. @@2L  TO KEEP SAT'S GREATER THAN OR EQUAL 88-90% Every Shift   Systane Nighttime Oint Apply to both eyes at bedtime   venlafaxine XR 150 MG 24 hr capsule Commonly known as: EFFEXOR-XR Take 150 mg by mouth daily with breakfast.   Vitamin D 50 MCG (2000 UT) Caps Take 2,000 Units by mouth daily.   zinc sulfate 220 (50 Zn) MG capsule Take 220 mg by mouth daily.       Review of Systems  Immunization History   Administered Date(s) Administered  . Influenza Whole 03/27/2018  . Influenza, High Dose Seasonal PF 03/26/2019  . Influenza-Unspecified 04/14/2017  . Pneumococcal Conjugate-13 09/24/2017  . Pneumococcal Polysaccharide-23 06/14/2013  . Td 09/25/2017  . Varicella 06/29/2003   Pertinent  Health Maintenance Due  Topic Date Due  . INFLUENZA VACCINE  Completed  . DEXA SCAN  Completed  . PNA vac Low Risk Adult  Completed   Fall Risk  09/19/2017  Falls in the past year? No   Functional Status Survey:    Vitals:   06/21/19 1453  BP: (!) 158/76  Pulse: 74  Resp: 20  Temp: (!) 97.3 F (36.3 C)  SpO2: 95%  Weight: 111 lb 8 oz (50.6 kg)  Height: 5\' 1"  (1.549 m)   Body mass index is 21.07 kg/m. Physical Exam  Labs reviewed: Recent Labs    11/19/18 0000 02/04/19 0000 04/15/19 0000  NA 134* 132* 132*  K 4.7 4.8 4.3  CL  --  98 97*  CO2  --  27 27*  BUN 12 15 13   CREATININE 0.7 0.6 0.6  CALCIUM  --  9.3 9.0   Recent Labs    08/11/18 0000 11/19/18 0000 04/15/19 0000  AST 21 25 26   ALT 18 30 27   ALKPHOS 82 78 95  ALBUMIN  --   --  4.0   Recent Labs    02/04/19 0000 04/15/19 0000 06/19/19 0000  WBC 5.4 7.5 5.0  HGB 12.6 12.7 13.8  HCT 37 37 41  PLT 284 295 234   Lab Results  Component Value Date   TSH 2.99 11/13/2017   Lab Results  Component Value Date   HGBA1C 5.8 06/29/2013   No results found for: CHOL, HDL, LDLCALC, LDLDIRECT, TRIG, CHOLHDL  Significant Diagnostic Results in last 30 days:  No results found.  Assessment/Plan There are no diagnoses linked to this encounter.   Family/ staff Communication:   Labs/tests ordered:

## 2019-06-22 LAB — HEPATIC FUNCTION PANEL
ALT: 10 (ref 7–35)
AST: 14 (ref 13–35)
Alkaline Phosphatase: 77 (ref 25–125)
Bilirubin, Total: 0.3

## 2019-06-22 LAB — BASIC METABOLIC PANEL
BUN: 16 (ref 4–21)
CO2: 24 — AB (ref 13–22)
Chloride: 102 (ref 99–108)
Creatinine: 1.2 — AB (ref 0.5–1.1)
Glucose: 96
Potassium: 4.6 (ref 3.4–5.3)
Sodium: 137 (ref 137–147)

## 2019-06-22 LAB — IRON,TIBC AND FERRITIN PANEL
%SAT: 17
Ferritin: 30
Iron: 52
TIBC: 307

## 2019-06-22 LAB — COMPREHENSIVE METABOLIC PANEL: Calcium: 9.1 (ref 8.7–10.7)

## 2019-06-23 ENCOUNTER — Non-Acute Institutional Stay (SKILLED_NURSING_FACILITY): Payer: Medicare Other | Admitting: Nurse Practitioner

## 2019-06-23 ENCOUNTER — Encounter: Payer: Self-pay | Admitting: Nurse Practitioner

## 2019-06-23 DIAGNOSIS — R000 Tachycardia, unspecified: Secondary | ICD-10-CM | POA: Diagnosis not present

## 2019-06-23 DIAGNOSIS — I10 Essential (primary) hypertension: Secondary | ICD-10-CM | POA: Diagnosis not present

## 2019-06-23 DIAGNOSIS — J1289 Other viral pneumonia: Secondary | ICD-10-CM | POA: Diagnosis not present

## 2019-06-23 DIAGNOSIS — N179 Acute kidney failure, unspecified: Secondary | ICD-10-CM | POA: Diagnosis not present

## 2019-06-23 DIAGNOSIS — E871 Hypo-osmolality and hyponatremia: Secondary | ICD-10-CM | POA: Diagnosis not present

## 2019-06-23 DIAGNOSIS — U071 COVID-19: Secondary | ICD-10-CM | POA: Diagnosis not present

## 2019-06-23 DIAGNOSIS — J42 Unspecified chronic bronchitis: Secondary | ICD-10-CM

## 2019-06-23 DIAGNOSIS — J1282 Pneumonia due to coronavirus disease 2019: Secondary | ICD-10-CM

## 2019-06-23 NOTE — Assessment & Plan Note (Signed)
Will complete Doxy, Dexamethasone, Continue Zinc, Vit D, ASA, observe.

## 2019-06-23 NOTE — Assessment & Plan Note (Signed)
Due to decreased oral intake related COVID PNA, trial of IVF D5 NS 75cc/hr x 2041ml, repeat BMP upon completion of IVF.

## 2019-06-23 NOTE — Assessment & Plan Note (Signed)
Blood pressure is controlled, continue Metoprolol. 

## 2019-06-23 NOTE — Assessment & Plan Note (Signed)
No wheezes, no O2 desaturation presently, continue Breo Ellipta.

## 2019-06-23 NOTE — Assessment & Plan Note (Signed)
Heart rate is in control, continue Metoprolol  

## 2019-06-23 NOTE — Progress Notes (Signed)
Location:   [facility] Room Number: [id number] Place of Service:  SNF (31) Provider: Sharifa Bucholz NP    [name], MD  Patient Care Team: [name], MD as PCP - General (Internal Medicine) [name], MD as Referring Physician (Ophthalmology) Merik Mignano X, NP as Nurse Practitioner (Internal Medicine)  Extended Emergency Contact Information Primary Emergency Contact: [name] Address: [address]          [place], [place] [address] [name] of [place] Phone: [phone] Mobile Phone: [phone] Relation: Son Secondary Emergency Contact: [name] Address: [address]          [place], [place] [address] [name] of [place] Phone: [phone] Mobile Phone: [phone] Relation: Son  Code Status:  DNR Goals of care: Advanced Directive information Advanced Directives 06/11/2019  Does Patient Have a Medical Advance Directive? Yes  Type of Advance Directive Out of facility DNR (pink MOST or yellow form)  Does patient want to make changes to medical advance directive? No - Patient declined  Copy of [place] in Chart? -  Would patient like information on creating a medical advance directive? -  Pre-existing out of facility DNR order (yellow form or pink MOST form) Pink MOST form placed in chart (order not valid for inpatient use)     Chief Complaint  Patient presents with  . Acute Visit    Poor oral intake     HPI:  Pt is a 85 y.o. female seen today for an acute visit for decreased appetite, limited oral intake, feeling tired, she denied nausea, vomiting, indigestion, constipation, diarrhea, or abd pain. COVID PNA, on Dexamethasone 6mg  qd, Doxycycline 100mg  bid, Zinc, Vit D, ASA. Hx of COPD, on Breo Ellipta qd. HTN/tachycardia, heart rate, blood pressure controlled on Metoprolol. 06/22/19 Na 137, K 4.6, Bun 16, creat 1.2. her baseline Na 132, creat 0.65 04/15/19.    Past Medical History:   Diagnosis Date  . Anemia, iron deficiency    takes Ferrous Sulfate daily  . Depression    takes Effexor daily  . Gait disorder 04/21/2014  . GERD (gastroesophageal reflux disease)    takes Omeprazole daily  . Herpes ocular    history of-takes Acyclovir daily  . High cholesterol    takes Atorvastatin daily  . History of hiatal hernia   . Hypertension    takes Metoprolol daily  . Insomnia    takes Xanax nightly  . Osteoarthritis of knee    bilateral  . Sciatic pain    Past Surgical History:  Procedure Laterality Date  . ABDOMINAL HYSTERECTOMY  1995   partial  . CARPAL TUNNEL RELEASE Right 07/21/2007  . CARPAL TUNNEL RELEASE Left 09/24/2007  . COLONOSCOPY    . DESCEMETS STRIPPING AUTOMATED ENDOTHELIAL KERATOPLASTY Left 03/14/2011  . DESCEMETS STRIPPING AUTOMATED ENDOTHELIAL KERATOPLASTY Right 08/20/2012  . EYE SURGERY Bilateral    cataract surgery  . EYE SURGERY Left    corneal transplant  . HERNIA REPAIR    . HIP ARTHROPLASTY Left 06/13/2013   Procedure: ARTHROPLASTY BIPOLAR HIP; Injection left shoulder;  Surgeon: Johnny Bridge, MD;  Location: [facility];  Service: Orthopedics;  Laterality: Left;  . JOINT REPLACEMENT     bilateral knees, left knee  . KNEE ARTHROSCOPY Right 05/11/2001  . KYPHOPLASTY N/A 04/10/2015   Procedure: T11 Kyphoplasty;  Surgeon: Jovita Gamma, MD;  Location: [facility];  Service: Neurosurgery;  Laterality: N/A;  T11 Kyphoplasty  . KYPHOPLASTY N/A 05/06/2015  Procedure: KYPHOPLASTY Thoracic twelve;  Surgeon: Jovita Gamma, MD;  Location: [facility];  Service: Neurosurgery;  Laterality: N/A;  T12 Kyphoplasty  . LAPAROSCOPIC NISSEN FUNDOPLICATION  [id number]  . LUMBAR LAMINECTOMY/DECOMPRESSION MICRODISCECTOMY  08/29/2010   L2-S1  . SHOULDER ARTHROSCOPY WITH ROTATOR CUFF REPAIR AND SUBACROMIAL DECOMPRESSION Right 01/01/2000  . TONSILLECTOMY     as child  . TOTAL HIP REVISION Left 07/08/2014   Procedure: TOTAL HIP REVISION;  Surgeon: Kerin Salen,  MD;  Location: [facility];  Service: Orthopedics;  Laterality: Left;  . TOTAL KNEE ARTHROPLASTY Left 05/14/2004  . TOTAL KNEE ARTHROPLASTY  12/23/2011   Procedure: TOTAL KNEE ARTHROPLASTY;  Surgeon: Lorn Junes, MD;  Location: [facility];  Service: Orthopedics;  Laterality: Right;  DR [place] THIS CASE  . TRIGGER FINGER RELEASE Right 07/21/2007   thumb  . TRIGGER FINGER RELEASE Left 09/24/2007   middle finger  . TRIGGER FINGER RELEASE Right 03/10/2008   ring and little fingers  . TRIGGER FINGER RELEASE Right 09/02/2012   Procedure: RELEASE TRIGGER FINGER/A-1 PULLEY RIGHT INDEX FINGER;  Surgeon: Wynonia Sours, MD;  Location: [facility];  Service: Orthopedics;  Laterality: Right;  . TRIGGER FINGER RELEASE Left 12/22/2012   Procedure: RELEASE TRIGGER FINGER/A-1 PULLEY LEFT RING FINGER;  Surgeon: Wynonia Sours, MD;  Location: [facility];  Service: Orthopedics;  Laterality: Left;  Left     Allergies  Allergen Reactions  . Morphine And Related Other (See Comments)    AGITATION, STRANGE DREAMS  . Scopolamine Other (See Comments)    MENTAL CHANGES  . Atorvastatin Other (See Comments)    unknown  . Morphine Other (See Comments)    Disoriented, mood changes  . Nsaids Other (See Comments)    unknown  . Pravachol [Pravastatin Sodium] Other (See Comments)    unknown  . Pravastatin Other (See Comments)    unknown  . Teriparatide Other (See Comments)    unknown    Allergies as of 06/23/2019      Reactions   Morphine And Related Other (See Comments)   AGITATION, STRANGE DREAMS   Scopolamine Other (See Comments)   MENTAL CHANGES   Atorvastatin Other (See Comments)   unknown   Morphine Other (See Comments)   Disoriented, mood changes   Nsaids Other (See Comments)   unknown   Pravachol [pravastatin Sodium] Other (See Comments)   unknown   Pravastatin Other (See Comments)   unknown   Teriparatide Other (See Comments)   unknown      Medication List        Accurate as of June 23, 2019 11:16 AM. If you have any questions, ask your nurse or doctor.        acetaminophen 500 MG tablet Commonly known as: TYLENOL Take 500 mg by mouth every 6 (six) hours as needed for mild pain or moderate pain. Also give 1 tablet  500 mg tab by mouth at bedtime   Non Aspirin NOT TO EXCEED 3,000 MG ACETAMINOPHEN IN 24 HRS (SCHEDULED + PRN DOSES).   Artificial Tears 0.2-0.2-1 % Soln Generic drug: Glycerin-Hypromellose-PEG 400 Place 1 drop into both eyes 4 (four) times daily.   aspirin EC 81 MG tablet Take 81 mg by mouth. To be given every 5 days.   Breo Ellipta 100-25 MCG/INH Aepb Generic drug: fluticasone furoate-vilanterol Inhale 1 puff into the lungs daily.   dexamethasone 6 MG tablet Commonly known as: DECADRON Take 6 mg by mouth daily.  docusate sodium 100 MG capsule Commonly known as: COLACE Take 1 capsule by mouth daily as needed for mild constipation.   doxycycline 100 MG tablet Commonly known as: VIBRA-TABS Take 100 mg by mouth 2 (two) times daily.   feeding supplement Liqd Commonly known as: BOOST / RESOURCE BREEZE Take 1 Container by mouth 2 (two) times daily. For weight loss   ferrous sulfate 325 (65 FE) MG tablet Take 325 mg by mouth daily.   LORazepam 0.5 MG tablet Commonly known as: ATIVAN Take 0.5 tablets (0.25 mg total) by mouth at bedtime.   magnesium hydroxide 400 MG/5ML suspension Commonly known as: MILK OF MAGNESIA Take 15 mLs by mouth at bedtime as needed for mild constipation.   memantine 10 MG tablet Commonly known as: NAMENDA Take 10 mg by mouth 2 (two) times daily.   metoprolol tartrate 25 MG tablet Commonly known as: LOPRESSOR Take 25 mg by mouth at bedtime.   METOPROLOL TARTRATE PO Take 50 mg by mouth daily.   nitroGLYCERIN 0.4 MG SL tablet Commonly known as: NITROSTAT Place 0.4 mg under the tongue every 5 (five) minutes as needed for chest pain.   omeprazole 10 MG capsule Commonly known  as: PRILOSEC Take 10 mg by mouth daily.   OXYGEN Inhale into the lungs. @@2L  TO KEEP SAT'S GREATER THAN OR EQUAL 88-90% Every Shift   saccharomyces boulardii 250 MG capsule Commonly known as: FLORASTOR Take 250 mg by mouth 2 (two) times daily.   Systane Nighttime Oint Apply to both eyes at bedtime   venlafaxine XR 150 MG 24 hr capsule Commonly known as: EFFEXOR-XR Take 150 mg by mouth daily with breakfast.   Vitamin D 50 MCG (2000 UT) Caps Take 2,000 Units by mouth daily.   zinc sulfate 220 (50 Zn) MG capsule Take 220 mg by mouth daily.      ROS was provided with assistance of staff.  Review of Systems  Constitutional: Positive for activity change, appetite change and fatigue. Negative for chills, diaphoresis and fever.  HENT: Positive for hearing loss. Negative for congestion and voice change.   Respiratory: Positive for cough and shortness of breath. Negative for wheezing.   Cardiovascular: Negative for chest pain, palpitations and leg swelling.  Gastrointestinal: Negative for abdominal distention, abdominal pain, constipation, diarrhea, nausea and vomiting.  Genitourinary: Negative for difficulty urinating, dysuria and urgency.  Musculoskeletal: Positive for gait problem.  Skin: Negative for color change and pallor.  Neurological: Negative for dizziness, speech difficulty, weakness and headaches.       Dementia  Psychiatric/Behavioral: Negative for agitation, behavioral problems, hallucinations and sleep disturbance. The patient is not nervous/anxious.     Immunization History  Administered Date(s) Administered  . Influenza Whole 03/27/2018  . Influenza, High Dose Seasonal PF 03/26/2019  . Influenza-Unspecified 04/14/2017  . Pneumococcal Conjugate-13 09/24/2017  . Pneumococcal Polysaccharide-23 06/14/2013  . Td 09/25/2017  . Varicella 06/29/2003   Pertinent  Health Maintenance Due  Topic Date Due  . INFLUENZA VACCINE  Completed  . DEXA SCAN  Completed  . PNA  vac Low Risk Adult  Completed   Fall Risk  09/19/2017  Falls in the past year? No   Functional Status Survey:    Vitals:   06/23/19 0836  BP: 140/78  Pulse: 82  Resp: 18  Temp: (!) 97 F (36.1 C)  SpO2: 92%  Weight: 111 lb 8 oz (50.6 kg)  Height: 5\' 1"  (1.549 m)   Body mass index is 21.07 kg/m. Physical Exam Vitals and  nursing note reviewed.  Constitutional:      Appearance: She is ill-appearing.  HENT:     Head: Normocephalic and atraumatic.     Nose: Nose normal.     Mouth/Throat:     Mouth: Mucous membranes are dry.  Eyes:     Extraocular Movements: Extraocular movements intact.     Conjunctiva/sclera: Conjunctivae normal.     Pupils: Pupils are equal, round, and reactive to light.  Cardiovascular:     Rate and Rhythm: Normal rate and regular rhythm.     Heart sounds: No murmur.  Pulmonary:     Breath sounds: No wheezing, rhonchi or rales.     Comments: Decreased air entry to both lungs.  Abdominal:     General: Bowel sounds are normal. There is no distension.     Palpations: Abdomen is soft.     Tenderness: There is no abdominal tenderness. There is no right CVA tenderness, left CVA tenderness, guarding or rebound.  Musculoskeletal:     Cervical back: Normal range of motion and neck supple.     Right lower leg: No edema.     Left lower leg: No edema.  Skin:    General: Skin is warm and dry.  Neurological:     General: No focal deficit present.     Mental Status: She is alert. Mental status is at baseline.     Gait: Gait abnormal.     Comments: Oriented to self.   Psychiatric:     Comments: Follows simple directions, answered simple questions. Confused. Falls asleep soon after talked to.      Labs reviewed: Recent Labs    02/04/19 0000 04/15/19 0000 06/22/19 0000  NA 132* 132* 137  K 4.8 4.3 4.6  CL 98 97* 102  CO2 27 27* 24*  BUN 15 13 16   CREATININE 0.6 0.6 1.2*  CALCIUM 9.3 9.0 9.1   Recent Labs    11/19/18 0000 04/15/19 0000 06/22/19  0000  AST 25 26 14   ALT 30 27 10   ALKPHOS 78 95 77  ALBUMIN  --  4.0  --    Recent Labs    02/04/19 0000 04/15/19 0000 06/19/19 0000  WBC 5.4 7.5 5.0  HGB 12.6 12.7 13.8  HCT 37 37 41  PLT 284 295 234   Lab Results  Component Value Date   TSH 2.99 11/13/2017   Lab Results  Component Value Date   HGBA1C 5.8 06/29/2013   No results found for: CHOL, HDL, LDLCALC, LDLDIRECT, TRIG, CHOLHDL  Significant Diagnostic Results in last 30 days:  No results found.  Assessment/Plan Acute renal injury ([place]) Due to decreased oral intake related COVID PNA, trial of IVF D5 NS 75cc/hr x 2019ml, repeat BMP upon completion of IVF.   Pneumonia due to COVID-19 virus Will complete Doxy, Dexamethasone, Continue Zinc, Vit D, ASA, observe.   Chronic bronchitis (H[facility]) No wheezes, no O2 desaturation presently, continue Breo Ellipta.   Tachycardia Heart rate is in control, continue Metoprolol.   Hypertension Blood pressure is controlled, continue Metoprolol.   Hyponatremia Serum Na wnl but increased from her baseline 132 due to decreased oral intake, IVF should restore her sodium to her baseline. Observe.      Family/ staff Communication: plan of care reviewed with the patient and charge nurse.   Labs/tests ordered:  BMP   Time spend 35 minutes.

## 2019-06-23 NOTE — Assessment & Plan Note (Signed)
Serum Na wnl but increased from her baseline 132 due to decreased oral intake, IVF should restore her sodium to her baseline. Observe.

## 2019-06-29 ENCOUNTER — Non-Acute Institutional Stay (SKILLED_NURSING_FACILITY): Payer: Medicare Other | Admitting: Internal Medicine

## 2019-06-29 ENCOUNTER — Encounter: Payer: Self-pay | Admitting: Internal Medicine

## 2019-06-29 DIAGNOSIS — J42 Unspecified chronic bronchitis: Secondary | ICD-10-CM

## 2019-06-29 DIAGNOSIS — J1282 Pneumonia due to coronavirus disease 2019: Secondary | ICD-10-CM | POA: Diagnosis not present

## 2019-06-29 DIAGNOSIS — E785 Hyperlipidemia, unspecified: Secondary | ICD-10-CM | POA: Diagnosis not present

## 2019-06-29 DIAGNOSIS — I10 Essential (primary) hypertension: Secondary | ICD-10-CM

## 2019-06-29 DIAGNOSIS — N179 Acute kidney failure, unspecified: Secondary | ICD-10-CM

## 2019-06-29 DIAGNOSIS — U071 COVID-19: Secondary | ICD-10-CM | POA: Diagnosis not present

## 2019-06-29 DIAGNOSIS — D509 Iron deficiency anemia, unspecified: Secondary | ICD-10-CM | POA: Diagnosis not present

## 2019-06-29 DIAGNOSIS — F0150 Vascular dementia without behavioral disturbance: Secondary | ICD-10-CM | POA: Diagnosis not present

## 2019-06-29 LAB — BASIC METABOLIC PANEL
BUN: 22 — AB (ref 4–21)
CO2: 22 (ref 13–22)
Chloride: 99 (ref 99–108)
Creatinine: 0.6 (ref 0.5–1.1)
Glucose: 127
Potassium: 4.6 (ref 3.4–5.3)
Sodium: 131 — AB (ref 137–147)

## 2019-06-29 LAB — HEPATIC FUNCTION PANEL
ALT: 18 (ref 7–35)
AST: 15 (ref 13–35)
Alkaline Phosphatase: 96 (ref 25–125)
Bilirubin, Total: 0.5

## 2019-06-29 LAB — COMPREHENSIVE METABOLIC PANEL
Albumin: 3.3 — AB (ref 3.5–5.0)
Calcium: 9 (ref 8.7–10.7)
Globulin: 2.8

## 2019-06-29 LAB — CBC AND DIFFERENTIAL
HCT: 38 (ref 36–46)
Hemoglobin: 13.1 (ref 12.0–16.0)
Platelets: 343 (ref 150–399)
WBC: 7.4

## 2019-06-29 LAB — CBC: RBC: 4.11 (ref 3.87–5.11)

## 2019-06-29 NOTE — Progress Notes (Signed)
Location:   [facility] Room Number: [id number] Place of Service:  SNF [phone]) Provider: [name], MD  [name], MD  Patient Care Team: [name], MD as PCP - General (Internal Medicine) [name], MD as Referring Physician (Ophthalmology) Mast, Man X, NP as Nurse Practitioner (Internal Medicine)  Extended Emergency Contact Information Primary Emergency Contact: [name] Address: [address]          [place], [place] [address] [name] of [place] Phone: [phone] Mobile Phone: [phone] Relation: Son Secondary Emergency Contact: [name] Address: [address]          [place], [place] [address] [name] of [place] Phone: [phone] Mobile Phone: [phone] Relation: Son  Code Status:  DNR Goals of care: Advanced Directive information Advanced Directives 06/11/2019  Does Patient Have a Medical Advance Directive? Yes  Type of Advance Directive Out of facility DNR (pink MOST or yellow form)  Does patient want to make changes to medical advance directive? No - Patient declined  Copy of [place] in Chart? -  Would patient like information on creating a medical advance directive? -  Pre-existing out of facility DNR order (yellow form or pink MOST form) Pink MOST form placed in chart (order not valid for inpatient use)     Chief Complaint  Patient presents with  . Acute Visit    Follow up of decreased appetite    HPI:  Pt is a 86 y.o. female seen today for an acute visit for Decreased Appetite, Cough and Drowsy  Patient has a history ofHypertension, anemia, depression,Hyperlipidemia,COPD, hyponatremia,Depression with Anxiety and Dementia Patient is a long-term resident of facility  Got Diagnosed with Covid.And Pneumonia Treated with IV fluids and Doxycyline with Dexamethasone Is doing better. Still has Cough. BUN and Creat today is normal. Appetite still poor.  Has lost 5 lbs Still has Productive Cough. Per Nurses Sleeps most of the day But was awake and responsive to me today Denied any Complains  Past Medical History:  Diagnosis Date  . Anemia, iron deficiency    takes Ferrous Sulfate daily  . Depression    takes Effexor daily  . Gait disorder 04/21/2014  . GERD (gastroesophageal reflux disease)    takes Omeprazole daily  . Herpes ocular    history of-takes Acyclovir daily  . High cholesterol    takes Atorvastatin daily  . History of hiatal hernia   . Hypertension    takes Metoprolol daily  . Insomnia    takes Xanax nightly  . Osteoarthritis of knee    bilateral  . Sciatic pain    Past Surgical History:  Procedure Laterality Date  . ABDOMINAL HYSTERECTOMY  1995   partial  . CARPAL TUNNEL RELEASE Right 07/21/2007  . CARPAL TUNNEL RELEASE Left 09/24/2007  . COLONOSCOPY    . DESCEMETS STRIPPING AUTOMATED ENDOTHELIAL KERATOPLASTY Left 03/14/2011  . DESCEMETS STRIPPING AUTOMATED ENDOTHELIAL KERATOPLASTY Right 08/20/2012  . EYE SURGERY Bilateral    cataract surgery  . EYE SURGERY Left    corneal transplant  . HERNIA REPAIR    . HIP ARTHROPLASTY Left 06/13/2013   Procedure: ARTHROPLASTY BIPOLAR HIP; Injection left shoulder;  Surgeon: Johnny Bridge, MD;  Location: [facility];  Service: Orthopedics;  Laterality: Left;  . JOINT REPLACEMENT     bilateral knees, left knee  . KNEE ARTHROSCOPY Right 05/11/2001  . KYPHOPLASTY N/A 04/10/2015   Procedure: T11 Kyphoplasty;  Surgeon: Jovita Gamma, MD;  Location: [facility]  NEURO ORS;  Service: Neurosurgery;  Laterality: N/A;  T11 Kyphoplasty  . KYPHOPLASTY N/A 05/06/2015   Procedure: KYPHOPLASTY Thoracic twelve;  Surgeon: Jovita Gamma, MD;  Location: [facility] NEURO ORS;  Service: Neurosurgery;  Laterality: N/A;  T12 Kyphoplasty  . LAPAROSCOPIC NISSEN FUNDOPLICATION  [id number]  . LUMBAR LAMINECTOMY/DECOMPRESSION MICRODISCECTOMY  08/29/2010   L2-S1  . SHOULDER ARTHROSCOPY WITH ROTATOR CUFF REPAIR AND  SUBACROMIAL DECOMPRESSION Right 01/01/2000  . TONSILLECTOMY     as child  . TOTAL HIP REVISION Left 07/08/2014   Procedure: TOTAL HIP REVISION;  Surgeon: Kerin Salen, MD;  Location: [facility];  Service: Orthopedics;  Laterality: Left;  . TOTAL KNEE ARTHROPLASTY Left 05/14/2004  . TOTAL KNEE ARTHROPLASTY  12/23/2011   Procedure: TOTAL KNEE ARTHROPLASTY;  Surgeon: Lorn Junes, MD;  Location: [facility];  Service: Orthopedics;  Laterality: Right;  DR [place] THIS CASE  . TRIGGER FINGER RELEASE Right 07/21/2007   thumb  . TRIGGER FINGER RELEASE Left 09/24/2007   middle finger  . TRIGGER FINGER RELEASE Right 03/10/2008   ring and little fingers  . TRIGGER FINGER RELEASE Right 09/02/2012   Procedure: RELEASE TRIGGER FINGER/A-1 PULLEY RIGHT INDEX FINGER;  Surgeon: Wynonia Sours, MD;  Location: [facility];  Service: Orthopedics;  Laterality: Right;  . TRIGGER FINGER RELEASE Left 12/22/2012   Procedure: RELEASE TRIGGER FINGER/A-1 PULLEY LEFT RING FINGER;  Surgeon: Wynonia Sours, MD;  Location: [facility];  Service: Orthopedics;  Laterality: Left;  Left     Allergies  Allergen Reactions  . Morphine And Related Other (See Comments)    AGITATION, STRANGE DREAMS  . Scopolamine Other (See Comments)    MENTAL CHANGES  . Atorvastatin Other (See Comments)    unknown  . Morphine Other (See Comments)    Disoriented, mood changes  . Nsaids Other (See Comments)    unknown  . Pravachol [Pravastatin Sodium] Other (See Comments)    unknown  . Pravastatin Other (See Comments)    unknown  . Teriparatide Other (See Comments)    unknown    Allergies as of 06/29/2019      Reactions   Morphine And Related Other (See Comments)   AGITATION, STRANGE DREAMS   Scopolamine Other (See Comments)   MENTAL CHANGES   Atorvastatin Other (See Comments)   unknown   Morphine Other (See Comments)   Disoriented, mood changes   Nsaids Other (See Comments)   unknown   Pravachol  [pravastatin Sodium] Other (See Comments)   unknown   Pravastatin Other (See Comments)   unknown   Teriparatide Other (See Comments)   unknown      Medication List       Accurate as of June 29, 2019 11:36 AM. If you have any questions, ask your nurse or doctor.        acetaminophen 500 MG tablet Commonly known as: TYLENOL Take 500 mg by mouth every 6 (six) hours as needed for mild pain or moderate pain. Also give 1 tablet  500 mg tab by mouth at bedtime   Non Aspirin NOT TO EXCEED 3,000 MG ACETAMINOPHEN IN 24 HRS (SCHEDULED + PRN DOSES).   Artificial Tears 0.2-0.2-1 % Soln Generic drug: Glycerin-Hypromellose-PEG 400 Place 1 drop into both eyes 4 (four) times daily.   ascorbic acid 500 MG tablet Commonly known as: VITAMIN C Take 500 mg by mouth daily.   aspirin EC 81 MG tablet Take 81 mg by mouth. To be given every  5 days.   Breo Ellipta 100-25 MCG/INH Aepb Generic drug: fluticasone furoate-vilanterol Inhale 1 puff into the lungs daily.   dexamethasone 6 MG tablet Commonly known as: DECADRON Take 6 mg by mouth daily.   docusate sodium 100 MG capsule Commonly known as: COLACE Take 1 capsule by mouth daily as needed for mild constipation.   feeding supplement Liqd Commonly known as: BOOST / RESOURCE BREEZE Take 1 Container by mouth 2 (two) times daily. For weight loss   ferrous sulfate 325 (65 FE) MG tablet Take 325 mg by mouth daily.   LORazepam 0.5 MG tablet Commonly known as: ATIVAN Take 0.5 tablets (0.25 mg total) by mouth at bedtime.   magnesium hydroxide 400 MG/5ML suspension Commonly known as: MILK OF MAGNESIA Take 15 mLs by mouth at bedtime as needed for mild constipation.   memantine 10 MG tablet Commonly known as: NAMENDA Take 10 mg by mouth 2 (two) times daily.   metoprolol tartrate 25 MG tablet Commonly known as: LOPRESSOR Take 25 mg by mouth at bedtime.   METOPROLOL TARTRATE PO Take 50 mg by mouth daily.   nitroGLYCERIN 0.4 MG SL  tablet Commonly known as: NITROSTAT Place 0.4 mg under the tongue every 5 (five) minutes as needed for chest pain.   omeprazole 10 MG capsule Commonly known as: PRILOSEC Take 10 mg by mouth daily.   OXYGEN Inhale into the lungs. @@2L  TO KEEP SAT'S GREATER THAN OR EQUAL 88-90% Every Shift   saccharomyces boulardii 250 MG capsule Commonly known as: FLORASTOR Take 250 mg by mouth 2 (two) times daily.   Systane Nighttime Oint Apply to both eyes at bedtime   venlafaxine XR 150 MG 24 hr capsule Commonly known as: EFFEXOR-XR Take 150 mg by mouth daily with breakfast.   Vitamin D 50 MCG (2000 UT) Caps Take 2,000 Units by mouth daily.   zinc sulfate 220 (50 Zn) MG capsule Take 220 mg by mouth daily.       Review of Systems  Unable to perform ROS: Dementia    Immunization History  Administered Date(s) Administered  . Influenza Whole 03/27/2018  . Influenza, High Dose Seasonal PF 03/26/2019  . Influenza-Unspecified 04/14/2017  . Pneumococcal Conjugate-13 09/24/2017  . Pneumococcal Polysaccharide-23 06/14/2013  . Td 09/25/2017  . Varicella 06/29/2003   Pertinent  Health Maintenance Due  Topic Date Due  . INFLUENZA VACCINE  Completed  . DEXA SCAN  Completed  . PNA vac Low Risk Adult  Completed   Fall Risk  09/19/2017  Falls in the past year? No   Functional Status Survey:    Vitals:   06/29/19 1130  BP: (!) 118/58  Pulse: 86  Resp: 18  Temp: (!) 97.4 F (36.3 C)  SpO2: 91%  Weight: 103 lb 6.4 oz (46.9 kg)  Height: 5\' 1"  (1.549 m)   Body mass index is 19.54 kg/m. Physical Exam  Constitutional:. Well-developed and well-nourished. Alert and responsive HENT:  Head: Normocephalic.  Mouth/Throat: Oropharynx is clear and moist.  Eyes: Pupils are equal, round, and reactive to light.  Neck: Neck supple.  Cardiovascular: Normal rate and normal heart sounds.  No murmur heard. Pulmonary/Chest: Effort normal and breath sounds normal. No respiratory distress. No  wheezes. Few Rales Bilateral.  Abdominal: Soft. Bowel sounds are normal. No distension. There is no tenderness. There is no rebound.  Musculoskeletal: No edema.  Lymphadenopathy: none Neurological: Alert , Responsive though Confused  Skin: Skin is warm and dry.  Psychiatric: Normal mood and affect. Behavior is  normal. Thought content normal.    Labs reviewed: Recent Labs    04/15/19 0000 06/22/19 0000 06/29/19 0000  NA 132* 137 131*  K 4.3 4.6 4.6  CL 97* 102 99  CO2 27* 24* 22  BUN 13 16 22*  CREATININE 0.6 1.2* 0.6  CALCIUM 9.0 9.1 9.0   Recent Labs    04/15/19 0000 06/22/19 0000 06/29/19 0000  AST 26 14 15   ALT 27 10 18   ALKPHOS 95 77 96  ALBUMIN 4.0  --  3.3*   Recent Labs    04/15/19 0000 06/19/19 0000 06/29/19 0000  WBC 7.5 5.0 7.4  HGB 12.7 13.8 13.1  HCT 37 41 38  PLT 295 234 343   Lab Results  Component Value Date   TSH 2.99 11/13/2017   Lab Results  Component Value Date   HGBA1C 5.8 06/29/2013   No results found for: CHOL, HDL, LDLCALC, LDLDIRECT, TRIG, CHOLHDL  Significant Diagnostic Results in last 30 days:  No results found.  Assessment/Plan  Pneumonia due to COVID-19 virus Still has Cough Was treated with Doxycyline and Dexamethasone Will Start on Mucinex On Breo  Doing better. Will keep in Quarantine for Another Week  Chronic bronchitis Continue on Dexa and Breo   Acute renal injury ([facility]) BUN and Creat Back to Normal 22/.59 Sodium is 131 Repeat Bmp in 1 week  Essential hypertension Stable on Lopressor  Vascular dementia without behavioral disturbance (HCC) On Namenda Continue Supportive Care  Iron deficiency anemia Continue on Iron Reduce to 3/week   Family/ staff Communication:   Labs/tests ordered:  BMP in 1 week

## 2019-06-29 NOTE — Progress Notes (Signed)
A user error has taken place.

## 2019-07-02 ENCOUNTER — Non-Acute Institutional Stay (SKILLED_NURSING_FACILITY): Payer: Medicare Other | Admitting: Internal Medicine

## 2019-07-02 ENCOUNTER — Encounter: Payer: Self-pay | Admitting: Internal Medicine

## 2019-07-02 DIAGNOSIS — U071 COVID-19: Secondary | ICD-10-CM | POA: Diagnosis not present

## 2019-07-02 DIAGNOSIS — R5383 Other fatigue: Secondary | ICD-10-CM

## 2019-07-02 DIAGNOSIS — E860 Dehydration: Secondary | ICD-10-CM

## 2019-07-02 DIAGNOSIS — I10 Essential (primary) hypertension: Secondary | ICD-10-CM

## 2019-07-02 DIAGNOSIS — J1282 Pneumonia due to coronavirus disease 2019: Secondary | ICD-10-CM

## 2019-07-02 LAB — CBC AND DIFFERENTIAL
HCT: 41 (ref 36–46)
Hemoglobin: 13.9 (ref 12.0–16.0)
Neutrophils Absolute: 6365
Platelets: 335 (ref 150–399)
WBC: 9.5

## 2019-07-02 LAB — BASIC METABOLIC PANEL
BUN: 28 — AB (ref 4–21)
CO2: 26 — AB (ref 13–22)
Chloride: 100 (ref 99–108)
Creatinine: 0.7 (ref 0.5–1.1)
Glucose: 104
Potassium: 4.5 (ref 3.4–5.3)
Sodium: 133 — AB (ref 137–147)

## 2019-07-02 LAB — CBC: RBC: 4.34 (ref 3.87–5.11)

## 2019-07-02 LAB — COMPREHENSIVE METABOLIC PANEL: Calcium: 8.9 (ref 8.7–10.7)

## 2019-07-02 NOTE — Progress Notes (Signed)
Location:   [facility] Room Number: [place] of Service:  SNF [phone]) Provider:  [name], MD  [name], MD  Patient Care Team: [name], MD as PCP - General (Internal Medicine) [name], MD as Referring Physician (Ophthalmology) Mast, Man X, NP as Nurse Practitioner (Internal Medicine)  Extended Emergency Contact Information Primary Emergency Contact: [name] Address: [address]          [place],  [address] [name] of [place] Phone: [phone] Mobile Phone: [phone] Relation: Son Secondary Emergency Contact: [name] Address: [address]          [place], [place] [address] [name] of [place] Phone: [phone] Mobile Phone: [phone] Relation: Son  Code Status:  DNR Goals of care: Advanced Directive information Advanced Directives 06/11/2019  Does Patient Have a Medical Advance Directive? Yes  Type of Advance Directive Out of facility DNR (pink MOST or yellow form)  Does patient want to make changes to medical advance directive? No - Patient declined  Copy of [place] in Chart? -  Would patient like information on creating a medical advance directive? -  Pre-existing out of facility DNR order (yellow form or pink MOST form) Pink MOST form placed in chart (order not valid for inpatient use)     Chief Complaint  Patient presents with  . Acute Visit    HPI:  Pt is a 86 y.o. female seen today for an acute visit for Decreased Appetite and Lethargic  Patient has a history ofHypertension, anemia, depression,Hyperlipidemia,COPD, hyponatremia,Depression with Anxiety and Dementia Patient is a long-term resident of facility Got Diagnosed with Covid.And Pneumonia Treated with IV fluids and Doxycyline with Dexamethasone Nurses are really concerned about the patient as she is not eating at all.  Today she was also more lethargic. With just opening  her eyes but then go back to sleep. Continues to have cough but no shortness of breath or fever.    Past Medical History:  Diagnosis Date  . Anemia, iron deficiency    takes Ferrous Sulfate daily  . Depression    takes Effexor daily  . Gait disorder 04/21/2014  . GERD (gastroesophageal reflux disease)    takes Omeprazole daily  . Herpes ocular    history of-takes Acyclovir daily  . High cholesterol    takes Atorvastatin daily  . History of hiatal hernia   . Hypertension    takes Metoprolol daily  . Insomnia    takes Xanax nightly  . Osteoarthritis of knee    bilateral  . Sciatic pain    Past Surgical History:  Procedure Laterality Date  . ABDOMINAL HYSTERECTOMY  1995   partial  . CARPAL TUNNEL RELEASE Right 07/21/2007  . CARPAL TUNNEL RELEASE Left 09/24/2007  . COLONOSCOPY    . DESCEMETS STRIPPING AUTOMATED ENDOTHELIAL KERATOPLASTY Left 03/14/2011  . DESCEMETS STRIPPING AUTOMATED ENDOTHELIAL KERATOPLASTY Right 08/20/2012  . EYE SURGERY Bilateral    cataract surgery  . EYE SURGERY Left    corneal transplant  . HERNIA REPAIR    . HIP ARTHROPLASTY Left 06/13/2013   Procedure: ARTHROPLASTY BIPOLAR HIP; Injection left shoulder;  Surgeon: Johnny Bridge, MD;  Location: [facility];  Service: Orthopedics;  Laterality: Left;  . JOINT REPLACEMENT     bilateral knees, left knee  . KNEE ARTHROSCOPY Right 05/11/2001  . KYPHOPLASTY N/A 04/10/2015   Procedure: T11 Kyphoplasty;  Surgeon: Jovita Gamma, MD;  Location: [facility] NEURO ORS;  Service: Neurosurgery;  Laterality: N/A;  T11 Kyphoplasty  . KYPHOPLASTY N/A 05/06/2015   Procedure: KYPHOPLASTY Thoracic twelve;  Surgeon: Jovita Gamma, MD;  Location: [facility] NEURO ORS;  Service: Neurosurgery;  Laterality: N/A;  T12 Kyphoplasty  . LAPAROSCOPIC NISSEN FUNDOPLICATION  [id number]  . LUMBAR LAMINECTOMY/DECOMPRESSION MICRODISCECTOMY  08/29/2010   L2-S1  . SHOULDER ARTHROSCOPY WITH ROTATOR CUFF REPAIR AND SUBACROMIAL DECOMPRESSION Right 01/01/2000  .  TONSILLECTOMY     as child  . TOTAL HIP REVISION Left 07/08/2014   Procedure: TOTAL HIP REVISION;  Surgeon: Kerin Salen, MD;  Location: [facility];  Service: Orthopedics;  Laterality: Left;  . TOTAL KNEE ARTHROPLASTY Left 05/14/2004  . TOTAL KNEE ARTHROPLASTY  12/23/2011   Procedure: TOTAL KNEE ARTHROPLASTY;  Surgeon: Lorn Junes, MD;  Location: [facility];  Service: Orthopedics;  Laterality: Right;  DR [place] THIS CASE  . TRIGGER FINGER RELEASE Right 07/21/2007   thumb  . TRIGGER FINGER RELEASE Left 09/24/2007   middle finger  . TRIGGER FINGER RELEASE Right 03/10/2008   ring and little fingers  . TRIGGER FINGER RELEASE Right 09/02/2012   Procedure: RELEASE TRIGGER FINGER/A-1 PULLEY RIGHT INDEX FINGER;  Surgeon: Wynonia Sours, MD;  Location: [facility];  Service: Orthopedics;  Laterality: Right;  . TRIGGER FINGER RELEASE Left 12/22/2012   Procedure: RELEASE TRIGGER FINGER/A-1 PULLEY LEFT RING FINGER;  Surgeon: Wynonia Sours, MD;  Location: [facility];  Service: Orthopedics;  Laterality: Left;  Left     Allergies  Allergen Reactions  . Morphine And Related Other (See Comments)    AGITATION, STRANGE DREAMS  . Scopolamine Other (See Comments)    MENTAL CHANGES  . Atorvastatin Other (See Comments)    unknown  . Morphine Other (See Comments)    Disoriented, mood changes  . Nsaids Other (See Comments)    unknown  . Pravachol [Pravastatin Sodium] Other (See Comments)    unknown  . Pravastatin Other (See Comments)    unknown  . Teriparatide Other (See Comments)    unknown    Allergies as of 07/02/2019      Reactions   Morphine And Related Other (See Comments)   AGITATION, STRANGE DREAMS   Scopolamine Other (See Comments)   MENTAL CHANGES   Atorvastatin Other (See Comments)   unknown   Morphine Other (See Comments)   Disoriented, mood changes   Nsaids Other (See Comments)   unknown   Pravachol [pravastatin Sodium] Other (See Comments)    unknown   Pravastatin Other (See Comments)   unknown   Teriparatide Other (See Comments)   unknown      Medication List       Accurate as of July 02, 2019 11:59 PM. If you have any questions, ask your nurse or doctor.        STOP taking these medications   dexamethasone 6 MG tablet Commonly known as: DECADRON Stopped by: [name], MD   saccharomyces boulardii 250 MG capsule Commonly known as: FLORASTOR Stopped by: [name], MD     TAKE these medications   acetaminophen 500 MG tablet Commonly known as: TYLENOL Take 500 mg by mouth every 6 (six) hours as needed for mild pain or moderate pain. Also give 1 tablet  500 mg tab by mouth at bedtime   Non Aspirin NOT TO EXCEED 3,000 MG ACETAMINOPHEN IN 24 HRS (SCHEDULED + PRN DOSES).   acetaminophen 500 MG tablet Commonly known as: TYLENOL Take 500 mg by mouth  at bedtime.   Artificial Tears 0.2-0.2-1 % Soln Generic drug: Glycerin-Hypromellose-PEG 400 Place 1 drop into both eyes 4 (four) times daily.   ascorbic acid 500 MG tablet Commonly known as: VITAMIN C Take 500 mg by mouth daily.   aspirin EC 81 MG tablet Take 81 mg by mouth. To be given every 5 days.   Breo Ellipta 100-25 MCG/INH Aepb Generic drug: fluticasone furoate-vilanterol Inhale 1 puff into the lungs daily.   docusate sodium 100 MG capsule Commonly known as: COLACE Take 1 capsule by mouth daily as needed for mild constipation.   feeding supplement Liqd Commonly known as: BOOST / RESOURCE BREEZE Take 1 Container by mouth 2 (two) times daily. For weight loss   ferrous sulfate 325 (65 FE) MG tablet Take 325 mg by mouth daily.   guaiFENesin 600 MG 12 hr tablet Commonly known as: MUCINEX Take by mouth 2 (two) times daily.   LORazepam 0.5 MG tablet Commonly known as: ATIVAN Take 0.5 tablets (0.25 mg total) by mouth at bedtime.   magnesium hydroxide 400 MG/5ML suspension Commonly known as: MILK OF MAGNESIA Take 15 mLs by mouth at bedtime  as needed for mild constipation.   memantine 10 MG tablet Commonly known as: NAMENDA Take 10 mg by mouth 2 (two) times daily.   metoprolol tartrate 25 MG tablet Commonly known as: LOPRESSOR Take 25 mg by mouth at bedtime.   METOPROLOL TARTRATE PO Take 50 mg by mouth daily.   nitroGLYCERIN 0.4 MG SL tablet Commonly known as: NITROSTAT Place 0.4 mg under the tongue every 5 (five) minutes as needed for chest pain.   omeprazole 10 MG capsule Commonly known as: PRILOSEC Take 10 mg by mouth daily.   OXYGEN Inhale into the lungs. @@2L  TO KEEP SAT'S GREATER THAN OR EQUAL 88-90% Every Shift   Systane Nighttime Oint Apply to both eyes at bedtime   venlafaxine XR 150 MG 24 hr capsule Commonly known as: EFFEXOR-XR Take 150 mg by mouth daily with breakfast.   Vitamin D 50 MCG (2000 UT) Caps Take 2,000 Units by mouth daily.   zinc sulfate 220 (50 Zn) MG capsule Take 220 mg by mouth daily.       Review of Systems  Unable to perform ROS: Mental status change    Immunization History  Administered Date(s) Administered  . Influenza Whole 03/27/2018  . Influenza, High Dose Seasonal PF 03/26/2019  . Influenza-Unspecified 04/14/2017  . Pneumococcal Conjugate-13 09/24/2017  . Pneumococcal Polysaccharide-23 06/14/2013  . Td 09/25/2017  . Varicella 06/29/2003   Pertinent  Health Maintenance Due  Topic Date Due  . INFLUENZA VACCINE  Completed  . DEXA SCAN  Completed  . PNA vac Low Risk Adult  Completed   Fall Risk  09/19/2017  Falls in the past year? No   Functional Status Survey:    Vitals:   07/02/19 1419  BP: 130/64  Pulse: 92  Resp: 18  Temp: 98.4 F (36.9 C)  SpO2: 94%  Weight: 103 lb 6.4 oz (46.9 kg)  Height: 5\' 3"  (1.6 m)   Body mass index is 18.32 kg/m. Physical Exam Vitals reviewed.  Constitutional:      Comments: Was lethargic  HENT:     Head: Normocephalic.     Nose: Nose normal.     Mouth/Throat:     Mouth: Mucous membranes are moist.      Pharynx: Oropharynx is clear.  Eyes:     Pupils: Pupils are equal, round, and reactive to light.  Cardiovascular:     Rate and Rhythm: Normal rate and regular rhythm.     Pulses: Normal pulses.  Pulmonary:     Effort: Pulmonary effort is normal.     Breath sounds: Normal breath sounds. No wheezing or rales.  Abdominal:     General: Abdomen is flat. Bowel sounds are normal.     Palpations: Abdomen is soft.  Musculoskeletal:        General: No swelling.     Cervical back: Neck supple.  Skin:    General: Skin is warm and dry.  Neurological:     Comments: No Very Responsive today  Psychiatric:        Mood and Affect: Mood normal.        Thought Content: Thought content normal.        Judgment: Judgment normal.     Labs reviewed: Recent Labs    04/15/19 0000 06/22/19 0000 06/29/19 0000  NA 132* 137 131*  K 4.3 4.6 4.6  CL 97* 102 99  CO2 27* 24* 22  BUN 13 16 22*  CREATININE 0.6 1.2* 0.6  CALCIUM 9.0 9.1 9.0   Recent Labs    04/15/19 0000 06/22/19 0000 06/29/19 0000  AST 26 14 15   ALT 27 10 18   ALKPHOS 95 77 96  ALBUMIN 4.0  --  3.3*   Recent Labs    04/15/19 0000 06/19/19 0000 06/29/19 0000  WBC 7.5 5.0 7.4  HGB 12.7 13.8 13.1  HCT 37 41 38  PLT 295 234 343   Lab Results  Component Value Date   TSH 2.99 11/13/2017   Lab Results  Component Value Date   HGBA1C 5.8 06/29/2013   No results found for: CHOL, HDL, LDLCALC, LDLDIRECT, TRIG, CHOLHDL  Significant Diagnostic Results in last 30 days:  No results found.  Assessment/Plan Dehydration Not taking anything PO Will start her on IV fluids NS 75cc/Hr for 2 litres BUN 28 Creat .74 Sodium 133  Lethargy Labs are normal Most likely due to Covid and Dehydration Will discontinue Ativan, Effexor,Iron, Mucinex,and Namenda HGB 13.9 WBC 4.5 Pneumonia due to COVID-19 virus No Wheezing. RA oxygen more then 90%  Essential hypertension Continue Metoprolol   ACP Son want s her to be comfort  care D/w Nurses for Compassionate care visit with son     Family/ staff Communication:   Labs/tests ordered:  BMP   Total time spent in this patient care encounter was  25_  minutes; greater than 50% of the visit spent counseling  staff, reviewing records , Labs and coordinating care for problems addressed at this encounter.

## 2019-07-05 ENCOUNTER — Non-Acute Institutional Stay (SKILLED_NURSING_FACILITY): Payer: Medicare Other | Admitting: Nurse Practitioner

## 2019-07-05 ENCOUNTER — Encounter: Payer: Self-pay | Admitting: Nurse Practitioner

## 2019-07-05 DIAGNOSIS — F0150 Vascular dementia without behavioral disturbance: Secondary | ICD-10-CM | POA: Diagnosis not present

## 2019-07-05 DIAGNOSIS — F339 Major depressive disorder, recurrent, unspecified: Secondary | ICD-10-CM

## 2019-07-05 DIAGNOSIS — E871 Hypo-osmolality and hyponatremia: Secondary | ICD-10-CM | POA: Diagnosis not present

## 2019-07-05 DIAGNOSIS — K219 Gastro-esophageal reflux disease without esophagitis: Secondary | ICD-10-CM

## 2019-07-05 DIAGNOSIS — U071 COVID-19: Secondary | ICD-10-CM

## 2019-07-05 DIAGNOSIS — R000 Tachycardia, unspecified: Secondary | ICD-10-CM

## 2019-07-05 DIAGNOSIS — J1282 Pneumonia due to coronavirus disease 2019: Secondary | ICD-10-CM

## 2019-07-05 DIAGNOSIS — I10 Essential (primary) hypertension: Secondary | ICD-10-CM | POA: Diagnosis not present

## 2019-07-05 LAB — BASIC METABOLIC PANEL
BUN: 11 (ref 4–21)
CO2: 23 — AB (ref 13–22)
Chloride: 103 (ref 99–108)
Creatinine: 0.6 (ref 0.5–1.1)
Glucose: 143
Potassium: 4.5 (ref 3.4–5.3)
Sodium: 134 — AB (ref 137–147)

## 2019-07-05 LAB — COMPREHENSIVE METABOLIC PANEL: Calcium: 8.5 — AB (ref 8.7–10.7)

## 2019-07-05 NOTE — Progress Notes (Addendum)
Location:   [facility] Room Number: 42 Place of Service:  SNF (31) Provider:  Jacklyn Branan NP  [name], MD  Patient Care Team: [name], MD as PCP - General (Internal Medicine) [name], MD as Referring Physician (Ophthalmology) Kieryn Burtis X, NP as Nurse Practitioner (Internal Medicine)  Extended Emergency Contact Information Primary Emergency Contact: [name] Address: [address]          [place], [place] [address] [name] of [place] Phone: [phone] Mobile Phone: [phone] Relation: Son Secondary Emergency Contact: [name] Address: [address]          [place], [place] [address] [name] of [place] Phone: [phone] Mobile Phone: [phone] Relation: Son  Code Status:  DNR Goals of care: Advanced Directive information Advanced Directives 06/11/2019  Does Patient Have a Medical Advance Directive? Yes  Type of Advance Directive Out of facility DNR (pink MOST or yellow form)  Does patient want to make changes to medical advance directive? No - Patient declined  Copy of [place] in Chart? -  Would patient like information on creating a medical advance directive? -  Pre-existing out of facility DNR order (yellow form or pink MOST form) Pink MOST form placed in chart (order not valid for inpatient use)     Chief Complaint  Patient presents with  . Medical Management of Chronic Issues    HPI:  Pt is a 86 y.o. female seen today for medical management of chronic diseases.    The patient resides in SNF [facility] for safety care assistance. She completed 14 day quarantine for COVID PNA, no O2 desaturation or wheezing, her goal of care is comfort measures.  She received IVF due to poor oral intake, Na 134, K 4.5, Bun 11, creat 0.6 today. She is off Effexor, Memantine, Lorazepam. HTN/heart rate are controlled on Metoprolol. GERD stable, on Omeprazole 10mg  qd. Osteoarthritis,  stable, on Tylenol 500mg  qd.    Past Medical History:  Diagnosis Date  . Anemia, iron deficiency    takes Ferrous Sulfate daily  . Depression    takes Effexor daily  . Gait disorder 04/21/2014  . GERD (gastroesophageal reflux disease)    takes Omeprazole daily  . Herpes ocular    history of-takes Acyclovir daily  . High cholesterol    takes Atorvastatin daily  . History of hiatal hernia   . Hypertension    takes Metoprolol daily  . Insomnia    takes Xanax nightly  . Osteoarthritis of knee    bilateral  . Sciatic pain    Past Surgical History:  Procedure Laterality Date  . ABDOMINAL HYSTERECTOMY  1995   partial  . CARPAL TUNNEL RELEASE Right 07/21/2007  . CARPAL TUNNEL RELEASE Left 09/24/2007  . COLONOSCOPY    . DESCEMETS STRIPPING AUTOMATED ENDOTHELIAL KERATOPLASTY Left 03/14/2011  . DESCEMETS STRIPPING AUTOMATED ENDOTHELIAL KERATOPLASTY Right 08/20/2012  . EYE SURGERY Bilateral    cataract surgery  . EYE SURGERY Left    corneal transplant  . HERNIA REPAIR    . HIP ARTHROPLASTY Left 06/13/2013   Procedure: ARTHROPLASTY BIPOLAR HIP; Injection left shoulder;  Surgeon: Johnny Bridge, MD;  Location: [facility];  Service: Orthopedics;  Laterality: Left;  . JOINT REPLACEMENT     bilateral knees, left knee  . KNEE ARTHROSCOPY Right 05/11/2001  . KYPHOPLASTY N/A 04/10/2015   Procedure: T11 Kyphoplasty;  Surgeon: Jovita Gamma, MD;  Location: [facility]UR[facility] OR[facility];  Service: Neurosurgery;  Laterality:  N/A;  T11 Kyphoplasty  . KYPHOPLASTY N/A 05/06/2015   Procedure: KYPHOPLASTY Thoracic twelve;  Surgeon: Jovita Gamma, MD;  Location: [facility];  Service: Neurosurgery;  Laterality: N/A;  T12 Kyphoplasty  . LAPAROSCOPIC NISSEN FUNDOPLICATION  [id number]  . LUMBAR LAMINECTOMY/DECOMPRESSION MICRODISCECTOMY  08/29/2010   L2-S1  . SHOULDER ARTHROSCOPY WITH ROTATOR CUFF REPAIR AND SUBACROMIAL DECOMPRESSION Right 01/01/2000  . TONSILLECTOMY     as child  . TOTAL HIP REVISION Left 07/08/2014    Procedure: TOTAL HIP REVISION;  Surgeon: Kerin Salen, MD;  Location: [facility];  Service: Orthopedics;  Laterality: Left;  . TOTAL KNEE ARTHROPLASTY Left 05/14/2004  . TOTAL KNEE ARTHROPLASTY  12/23/2011   Procedure: TOTAL KNEE ARTHROPLASTY;  Surgeon: Lorn Junes, MD;  Location: [facility];  Service: Orthopedics;  Laterality: Right;  DR [place] THIS CASE  . TRIGGER FINGER RELEASE Right 07/21/2007   thumb  . TRIGGER FINGER RELEASE Left 09/24/2007   middle finger  . TRIGGER FINGER RELEASE Right 03/10/2008   ring and little fingers  . TRIGGER FINGER RELEASE Right 09/02/2012   Procedure: RELEASE TRIGGER FINGER/A-1 PULLEY RIGHT INDEX FINGER;  Surgeon: Wynonia Sours, MD;  Location: [facility];  Service: Orthopedics;  Laterality: Right;  . TRIGGER FINGER RELEASE Left 12/22/2012   Procedure: RELEASE TRIGGER FINGER/A-1 PULLEY LEFT RING FINGER;  Surgeon: Wynonia Sours, MD;  Location: [facility];  Service: Orthopedics;  Laterality: Left;  Left     Allergies  Allergen Reactions  . Morphine And Related Other (See Comments)    AGITATION, STRANGE DREAMS  . Scopolamine Other (See Comments)    MENTAL CHANGES  . Atorvastatin Other (See Comments)    unknown  . Morphine Other (See Comments)    Disoriented, mood changes  . Nsaids Other (See Comments)    unknown  . Pravachol [Pravastatin Sodium] Other (See Comments)    unknown  . Pravastatin Other (See Comments)    unknown  . Teriparatide Other (See Comments)    unknown    Allergies as of 07/05/2019      Reactions   Morphine And Related Other (See Comments)   AGITATION, STRANGE DREAMS   Scopolamine Other (See Comments)   MENTAL CHANGES   Atorvastatin Other (See Comments)   unknown   Morphine Other (See Comments)   Disoriented, mood changes   Nsaids Other (See Comments)   unknown   Pravachol [pravastatin Sodium] Other (See Comments)   unknown   Pravastatin Other (See Comments)   unknown   Teriparatide  Other (See Comments)   unknown      Medication List       Accurate as of July 05, 2019 11:59 PM. If you have any questions, ask your nurse or doctor.        STOP taking these medications   ferrous sulfate 325 (65 FE) MG tablet Stopped by: Shirl Weir X Monai Hindes, NP   guaiFENesin 600 MG 12 hr tablet Commonly known as: MUCINEX Stopped by: Kammy Klett X Adrian Specht, NP   LORazepam 0.5 MG tablet Commonly known as: ATIVAN Stopped by: Mysha Peeler X Corbet Hanley, NP   memantine 10 MG tablet Commonly known as: NAMENDA Stopped by: Curvin Hunger X Graciella Arment, NP   venlafaxine XR 150 MG 24 hr capsule Commonly known as: EFFEXOR-XR Stopped by: Yoana Staib X Lashaya Kienitz, NP     TAKE these medications   acetaminophen 500 MG tablet Commonly known as: TYLENOL Take 500 mg by mouth every 6 (six) hours as needed  for mild pain or moderate pain. Also give 1 tablet  500 mg tab by mouth at bedtime   Non Aspirin NOT TO EXCEED 3,000 MG ACETAMINOPHEN IN 24 HRS (SCHEDULED + PRN DOSES).   acetaminophen 500 MG tablet Commonly known as: TYLENOL Take 500 mg by mouth at bedtime.   Artificial Tears 0.2-0.2-1 % Soln Generic drug: Glycerin-Hypromellose-PEG 400 Place 1 drop into both eyes 4 (four) times daily.   ascorbic acid 500 MG tablet Commonly known as: VITAMIN C Take 500 mg by mouth daily.   aspirin EC 81 MG tablet Take 81 mg by mouth. To be given every 5 days.   Breo Ellipta 100-25 MCG/INH Aepb Generic drug: fluticasone furoate-vilanterol Inhale 1 puff into the lungs daily.   docusate sodium 100 MG capsule Commonly known as: COLACE Take 1 capsule by mouth daily as needed for mild constipation.   feeding supplement Liqd Commonly known as: BOOST / RESOURCE BREEZE Take 1 Container by mouth 2 (two) times daily. For weight loss   magnesium hydroxide 400 MG/5ML suspension Commonly known as: MILK OF MAGNESIA Take 15 mLs by mouth at bedtime as needed for mild constipation.   metoprolol tartrate 25 MG tablet Commonly known as: LOPRESSOR Take 25 mg by  mouth at bedtime.   METOPROLOL TARTRATE PO Take 50 mg by mouth daily.   nitroGLYCERIN 0.4 MG SL tablet Commonly known as: NITROSTAT Place 0.4 mg under the tongue every 5 (five) minutes as needed for chest pain.   omeprazole 10 MG capsule Commonly known as: PRILOSEC Take 10 mg by mouth daily.   OXYGEN Inhale into the lungs. @@2L  TO KEEP SAT'S GREATER THAN OR EQUAL 88-90% Every Shift   Systane Nighttime Oint Apply to both eyes at bedtime   Vitamin D 50 MCG (2000 UT) Caps Take 2,000 Units by mouth daily.   zinc sulfate 220 (50 Zn) MG capsule Take 220 mg by mouth daily.      ROS was provided with assistance of staff.  Review of Systems  Constitutional: Positive for appetite change, fatigue and unexpected weight change. Negative for activity change, chills, diaphoresis and fever.       Weight loss during COVId PNA  HENT: Positive for hearing loss. Negative for congestion and voice change.   Eyes: Negative for visual disturbance.  Respiratory: Positive for cough and shortness of breath. Negative for wheezing.        Occasional cough, DOE  Cardiovascular: Negative for chest pain, palpitations and leg swelling.  Gastrointestinal: Negative for abdominal distention, abdominal pain, constipation, diarrhea, nausea and vomiting.  Genitourinary: Negative for difficulty urinating, dysuria and urgency.  Musculoskeletal: Positive for arthralgias and gait problem.  Skin: Negative for color change and pallor.  Neurological: Negative for dizziness, speech difficulty, weakness and headaches.       Dementia  Psychiatric/Behavioral: Negative for agitation, behavioral problems, hallucinations and sleep disturbance. The patient is not nervous/anxious.     Immunization History  Administered Date(s) Administered  . Influenza Whole 03/27/2018  . Influenza, High Dose Seasonal PF 03/26/2019  . Influenza-Unspecified 04/14/2017  . Pneumococcal Conjugate-13 09/24/2017  . Pneumococcal  Polysaccharide-23 06/14/2013  . Td 09/25/2017  . Varicella 06/29/2003   Pertinent  Health Maintenance Due  Topic Date Due  . INFLUENZA VACCINE  Completed  . DEXA SCAN  Completed  . PNA vac Low Risk Adult  Completed   Fall Risk  09/19/2017  Falls in the past year? No   Functional Status Survey:    Vitals:   07/05/19  1634  BP: 126/64  Pulse: 84  Resp: 18  Temp: 97.9 F (36.6 C)  SpO2: 95%  Weight: 103 lb 6.4 oz (46.9 kg)  Height: 5\' 3"  (1.6 m)   Body mass index is 18.32 kg/m. Physical Exam Vitals and nursing note reviewed.  Constitutional:      General: She is not in acute distress.    Appearance: Normal appearance. She is not ill-appearing, toxic-appearing or diaphoretic.  HENT:     Head: Normocephalic and atraumatic.     Nose: Nose normal.     Mouth/Throat:     Mouth: Mucous membranes are dry.  Eyes:     Extraocular Movements: Extraocular movements intact.     Conjunctiva/sclera: Conjunctivae normal.     Pupils: Pupils are equal, round, and reactive to light.  Cardiovascular:     Rate and Rhythm: Normal rate and regular rhythm.     Heart sounds: No murmur.  Pulmonary:     Breath sounds: Rales present. No wheezing or rhonchi.     Comments: Bibasilar rales.  Abdominal:     General: Bowel sounds are normal. There is no distension.     Palpations: Abdomen is soft.     Tenderness: There is no abdominal tenderness. There is no right CVA tenderness, left CVA tenderness, guarding or rebound.  Musculoskeletal:     Cervical back: Normal range of motion and neck supple.     Right lower leg: No edema.     Left lower leg: No edema.  Skin:    General: Skin is warm and dry.  Neurological:     General: No focal deficit present.     Mental Status: She is alert. Mental status is at baseline.     Motor: No weakness.     Coordination: Coordination normal.     Gait: Gait abnormal.     Comments: Oriented to self.   Psychiatric:        Mood and Affect: Mood normal.         Behavior: Behavior normal.     Labs reviewed: Recent Labs    06/29/19 0000 07/02/19 0000 07/05/19 0000  NA 131* 133* 134*  K 4.6 4.5 4.5  CL 99 100 103  CO2 22 26* 23*  BUN 22* 28* 11  CREATININE 0.6 0.7 0.6  CALCIUM 9.0 8.9 8.5*   Recent Labs    04/15/19 0000 06/22/19 0000 06/29/19 0000  AST 26 14 15   ALT 27 10 18   ALKPHOS 95 77 96  ALBUMIN 4.0  --  3.3*   Recent Labs    06/19/19 0000 06/29/19 0000 07/02/19 0000  WBC 5.0 7.4 9.5  NEUTROABS  --   --  6,365  HGB 13.8 13.1 13.9  HCT 41 38 41  PLT 234 343 335   Lab Results  Component Value Date   TSH 2.99 11/13/2017   Lab Results  Component Value Date   HGBA1C 5.8 06/29/2013   No results found for: CHOL, HDL, LDLCALC, LDLDIRECT, TRIG, CHOLHDL  Significant Diagnostic Results in last 30 days:  No results found.  Assessment/Plan Hypertension Blood pressure is controlled, continue Metoprolol.   Pneumonia due to COVID-19 virus No wheezing or O2 desaturation. Fully treated with Doxy, Decadron, Mucinex, Breo  Vascular dementia ([place]) Continue SNF [facility] for safety, care assistance, off Memantine, goal of care is comfort measures.   Tachycardia Heart rate is in control, continue Metoprolol.   Depression, recurrent ([place]) Her mood is stable, off Effexor, Lorazepam.   GERD (  gastroesophageal reflux disease) Stable, continue Omeprazole.   Hyponatremia At her baseline, Na 134 07/05/19, risk for dehydration, encourage oral intake.      Family/ staff Communication: plan of care reviewed with the patient and charge nurse.   Labs/tests ordered:  none  Time spend 25 minutes.

## 2019-07-06 ENCOUNTER — Encounter: Payer: Self-pay | Admitting: Nurse Practitioner

## 2019-07-06 NOTE — Assessment & Plan Note (Addendum)
No wheezing or O2 desaturation. Fully treated with Doxy, Decadron, Mucinex, Breo

## 2019-07-06 NOTE — Assessment & Plan Note (Signed)
Her mood is stable, off Effexor, Lorazepam.

## 2019-07-06 NOTE — Assessment & Plan Note (Signed)
Stable, continue Omeprazole.  

## 2019-07-06 NOTE — Assessment & Plan Note (Signed)
Blood pressure is controlled, continue Metoprolol. 

## 2019-07-06 NOTE — Assessment & Plan Note (Signed)
Heart rate is in control, continue Metoprolol  

## 2019-07-06 NOTE — Assessment & Plan Note (Signed)
At her baseline, Na 134 07/05/19, risk for dehydration, encourage oral intake.

## 2019-07-06 NOTE — Assessment & Plan Note (Addendum)
Continue SNF [facility] for safety, care assistance, off Memantine, goal of care is comfort measures.

## 2019-07-23 ENCOUNTER — Encounter: Payer: Self-pay | Admitting: Nurse Practitioner

## 2019-07-23 ENCOUNTER — Non-Acute Institutional Stay (SKILLED_NURSING_FACILITY): Payer: Medicare Other | Admitting: Nurse Practitioner

## 2019-07-23 DIAGNOSIS — I10 Essential (primary) hypertension: Secondary | ICD-10-CM | POA: Diagnosis not present

## 2019-07-23 DIAGNOSIS — L89610 Pressure ulcer of right heel, unstageable: Secondary | ICD-10-CM | POA: Diagnosis not present

## 2019-07-23 DIAGNOSIS — F0150 Vascular dementia without behavioral disturbance: Secondary | ICD-10-CM

## 2019-07-23 DIAGNOSIS — D509 Iron deficiency anemia, unspecified: Secondary | ICD-10-CM

## 2019-07-23 DIAGNOSIS — R000 Tachycardia, unspecified: Secondary | ICD-10-CM

## 2019-07-23 DIAGNOSIS — M5416 Radiculopathy, lumbar region: Secondary | ICD-10-CM

## 2019-07-23 DIAGNOSIS — L89619 Pressure ulcer of right heel, unspecified stage: Secondary | ICD-10-CM | POA: Insufficient documentation

## 2019-07-23 NOTE — Assessment & Plan Note (Signed)
Stable, Hgb 13.9 06/27/19, not takign Iron.

## 2019-07-23 NOTE — Assessment & Plan Note (Signed)
Stable, w/c for mobility, Tylenol daily for pain control.

## 2019-07-23 NOTE — Assessment & Plan Note (Signed)
Blood pressure, heart rate are controlled, continue Metoprolol.

## 2019-07-23 NOTE — Assessment & Plan Note (Signed)
Heart rate is in control, continue Metoprolol  

## 2019-07-23 NOTE — Progress Notes (Signed)
Location:   [place] Room Number: 42 Place of Service:  SNF (31) Provider:  Allante Whitmire NP  [name], MD  Patient Care Team: [name], MD as PCP - General (Internal Medicine) [name], MD as Referring Physician (Ophthalmology) Demonte Dobratz X, NP as Nurse Practitioner (Internal Medicine)  Extended Emergency Contact Information Primary Emergency Contact: [name] Address: [address]          [place], [place] [address] [name] of [place] Phone: [phone] Mobile Phone: [phone] Relation: Son Secondary Emergency Contact: [name] Address: [address]          [place], [place] [address] [name] of [place] Phone: [phone] Mobile Phone: [phone] Relation: Son  Code Status:  DNR Goals of care: Advanced Directive information Advanced Directives 07/23/2019  Does Patient Have a Medical Advance Directive? Yes  Type of Advance Directive Out of facility DNR (pink MOST or yellow form)  Does patient want to make changes to medical advance directive? No - Patient declined  Copy of [place] in Chart? -  Would patient like information on creating a medical advance directive? -  Pre-existing out of facility DNR order (yellow form or pink MOST form) Yellow form placed in chart (order not valid for inpatient use);Pink MOST form placed in chart (order not valid for inpatient use)     Chief Complaint  Patient presents with  . Acute Visit    Right heel pressure ulcer    HPI:  Pt is a 86 y.o. female seen today for an acute visit for right heel blood filled blister, duration and onset are uncertain. The patient resides in SNF [facility] for safety, care assistance, w/c for mobility. OA pain, controlled on Tylenol 500mg  qd. HTN, heart rate are controlled on Metoprolol 25mg  qd, 50mg  qd. Anemia stable, Hgb 13.9 07/02/19   Past Medical History:  Diagnosis Date  . Anemia, iron deficiency    takes Ferrous Sulfate daily  . Depression    takes Effexor daily  . Gait disorder 04/21/2014  . GERD (gastroesophageal reflux disease)    takes Omeprazole daily  . Herpes ocular    history of-takes Acyclovir daily  . High cholesterol    takes Atorvastatin daily  . History of hiatal hernia   . Hypertension    takes Metoprolol daily  . Insomnia    takes Xanax nightly  . Osteoarthritis of knee    bilateral  . Sciatic pain    Past Surgical History:  Procedure Laterality Date  . ABDOMINAL HYSTERECTOMY  1995   partial  . CARPAL TUNNEL RELEASE Right 07/21/2007  . CARPAL TUNNEL RELEASE Left 09/24/2007  . COLONOSCOPY    . DESCEMETS STRIPPING AUTOMATED ENDOTHELIAL KERATOPLASTY Left 03/14/2011  . DESCEMETS STRIPPING AUTOMATED ENDOTHELIAL KERATOPLASTY Right 08/20/2012  . EYE SURGERY Bilateral    cataract surgery  . EYE SURGERY Left    corneal transplant  . HERNIA REPAIR    . HIP ARTHROPLASTY Left 06/13/2013   Procedure: ARTHROPLASTY BIPOLAR HIP; Injection left shoulder;  Surgeon: Johnny Bridge, MD;  Location: [facility];  Service: Orthopedics;  Laterality: Left;  . JOINT REPLACEMENT     bilateral knees, left knee  . KNEE ARTHROSCOPY Right 05/11/2001  . KYPHOPLASTY N/A 04/10/2015   Procedure: T11 Kyphoplasty;  Surgeon: Jovita Gamma, MD;  Location: [facility];  Service: Neurosurgery;  Laterality: N/A;  T11 Kyphoplasty  . KYPHOPLASTY N/A 05/06/2015   Procedure: KYPHOPLASTY Thoracic twelve;  Surgeon:  Jovita Gamma, MD;  Location: [facility];  Service: Neurosurgery;  Laterality: N/A;  T12 Kyphoplasty  . LAPAROSCOPIC NISSEN FUNDOPLICATION  [id number]  . LUMBAR LAMINECTOMY/DECOMPRESSION MICRODISCECTOMY  08/29/2010   L2-S1  . SHOULDER ARTHROSCOPY WITH ROTATOR CUFF REPAIR AND SUBACROMIAL DECOMPRESSION Right 01/01/2000  . TONSILLECTOMY     as child  . TOTAL HIP REVISION Left 07/08/2014   Procedure: TOTAL HIP REVISION;  Surgeon: Kerin Salen, MD;  Location: [facility];  Service: Orthopedics;   Laterality: Left;  . TOTAL KNEE ARTHROPLASTY Left 05/14/2004  . TOTAL KNEE ARTHROPLASTY  12/23/2011   Procedure: TOTAL KNEE ARTHROPLASTY;  Surgeon: Lorn Junes, MD;  Location: [facility];  Service: Orthopedics;  Laterality: Right;  DR [place] THIS CASE  . TRIGGER FINGER RELEASE Right 07/21/2007   thumb  . TRIGGER FINGER RELEASE Left 09/24/2007   middle finger  . TRIGGER FINGER RELEASE Right 03/10/2008   ring and little fingers  . TRIGGER FINGER RELEASE Right 09/02/2012   Procedure: RELEASE TRIGGER FINGER/A-1 PULLEY RIGHT INDEX FINGER;  Surgeon: Wynonia Sours, MD;  Location: [facility];  Service: Orthopedics;  Laterality: Right;  . TRIGGER FINGER RELEASE Left 12/22/2012   Procedure: RELEASE TRIGGER FINGER/A-1 PULLEY LEFT RING FINGER;  Surgeon: Wynonia Sours, MD;  Location: [facility];  Service: Orthopedics;  Laterality: Left;  Left     Allergies  Allergen Reactions  . Morphine And Related Other (See Comments)    AGITATION, STRANGE DREAMS  . Scopolamine Other (See Comments)    MENTAL CHANGES  . Atorvastatin Other (See Comments)    unknown  . Morphine Other (See Comments)    Disoriented, mood changes  . Nsaids Other (See Comments)    unknown  . Pravachol [Pravastatin Sodium] Other (See Comments)    unknown  . Pravastatin Other (See Comments)    unknown  . Teriparatide Other (See Comments)    unknown    Allergies as of 07/23/2019      Reactions   Morphine And Related Other (See Comments)   AGITATION, STRANGE DREAMS   Scopolamine Other (See Comments)   MENTAL CHANGES   Atorvastatin Other (See Comments)   unknown   Morphine Other (See Comments)   Disoriented, mood changes   Nsaids Other (See Comments)   unknown   Pravachol [pravastatin Sodium] Other (See Comments)   unknown   Pravastatin Other (See Comments)   unknown   Teriparatide Other (See Comments)   unknown      Medication List       Accurate as of July 23, 2019  4:13  PM. If you have any questions, ask your nurse or doctor.        acetaminophen 500 MG tablet Commonly known as: TYLENOL Take 500 mg by mouth every 6 (six) hours as needed for mild pain or moderate pain. Also give 1 tablet  500 mg tab by mouth at bedtime   Non Aspirin NOT TO EXCEED 3,000 MG ACETAMINOPHEN IN 24 HRS (SCHEDULED + PRN DOSES).   acetaminophen 500 MG tablet Commonly known as: TYLENOL Take 500 mg by mouth at bedtime.   Artificial Tears 0.2-0.2-1 % Soln Generic drug: Glycerin-Hypromellose-PEG 400 Place 1 drop into both eyes 4 (four) times daily.   ascorbic acid 500 MG tablet Commonly known as: VITAMIN C Take 500 mg by mouth daily.   aspirin EC 81 MG tablet Take 81 mg by mouth daily.   Breo Ellipta 100-25 MCG/INH Aepb Generic drug:  fluticasone furoate-vilanterol Inhale 1 puff into the lungs daily.   docusate sodium 100 MG capsule Commonly known as: COLACE Take 1 capsule by mouth daily as needed for mild constipation.   feeding supplement Liqd Commonly known as: BOOST / RESOURCE BREEZE Take 1 Container by mouth 3 (three) times daily. For weight loss   magnesium hydroxide 400 MG/5ML suspension Commonly known as: MILK OF MAGNESIA Take 15 mLs by mouth at bedtime as needed for mild constipation.   metoprolol tartrate 25 MG tablet Commonly known as: LOPRESSOR Take 25 mg by mouth at bedtime.   METOPROLOL TARTRATE PO Take 50 mg by mouth daily.   nitroGLYCERIN 0.4 MG SL tablet Commonly known as: NITROSTAT Place 0.4 mg under the tongue every 5 (five) minutes as needed for chest pain.   omeprazole 10 MG capsule Commonly known as: PRILOSEC Take 10 mg by mouth daily.   OXYGEN Inhale into the lungs. @@2L  TO KEEP SAT'S GREATER THAN OR EQUAL 88-90% Every Shift   Systane Nighttime Oint Apply to both eyes at bedtime   Vitamin D 50 MCG (2000 UT) Caps Take 2,000 Units by mouth daily.   zinc sulfate 220 (50 Zn) MG capsule Take 220 mg by mouth daily.      ROS was  provided with assistance of staff.  Review of Systems  Constitutional: Negative for activity change, appetite change, chills, diaphoresis, fatigue and fever.  HENT: Positive for hearing loss. Negative for congestion and voice change.   Eyes: Negative for visual disturbance.  Respiratory: Positive for shortness of breath. Negative for cough and wheezing.        DOE  Cardiovascular: Negative for chest pain, palpitations and leg swelling.  Gastrointestinal: Negative for abdominal distention, abdominal pain, constipation, diarrhea, nausea and vomiting.  Genitourinary: Negative for difficulty urinating, dysuria and urgency.  Musculoskeletal: Positive for arthralgias and gait problem.  Skin: Positive for wound. Negative for color change and pallor.       Right heel a dime sized blood filled blister.   Neurological: Negative for dizziness, speech difficulty, weakness and headaches.       Dementia  Psychiatric/Behavioral: Positive for confusion. Negative for agitation, behavioral problems, hallucinations and sleep disturbance. The patient is not nervous/anxious.     Immunization History  Administered Date(s) Administered  . Influenza Whole 03/27/2018  . Influenza, High Dose Seasonal PF 03/26/2019  . Influenza-Unspecified 04/14/2017  . Pneumococcal Conjugate-13 09/24/2017  . Pneumococcal Polysaccharide-23 06/14/2013  . Td 09/25/2017  . Varicella 06/29/2003   Pertinent  Health Maintenance Due  Topic Date Due  . INFLUENZA VACCINE  Completed  . DEXA SCAN  Completed  . PNA vac Low Risk Adult  Completed   Fall Risk  09/19/2017  Falls in the past year? No   Functional Status Survey:    Vitals:   07/23/19 1311  BP: 110/60  Pulse: 70  Resp: 20  Temp: 97.8 F (36.6 C)  SpO2: 95%  Weight: 103 lb 6.4 oz (46.9 kg)  Height: 5\' 3"  (1.6 m)   Body mass index is 18.32 kg/m. Physical Exam Vitals and nursing note reviewed.  Constitutional:      General: She is not in acute distress.     Appearance: Normal appearance. She is not ill-appearing, toxic-appearing or diaphoretic.  HENT:     Head: Normocephalic and atraumatic.     Nose: Nose normal.     Mouth/Throat:     Mouth: Mucous membranes are moist.  Eyes:     Extraocular Movements: Extraocular movements  intact.     Conjunctiva/sclera: Conjunctivae normal.     Pupils: Pupils are equal, round, and reactive to light.  Cardiovascular:     Rate and Rhythm: Normal rate and regular rhythm.     Heart sounds: No murmur.  Pulmonary:     Breath sounds: No wheezing, rhonchi or rales.  Abdominal:     General: Bowel sounds are normal. There is no distension.     Palpations: Abdomen is soft.     Tenderness: There is no abdominal tenderness. There is no right CVA tenderness, left CVA tenderness, guarding or rebound.  Musculoskeletal:     Cervical back: Normal range of motion and neck supple.     Right lower leg: No edema.     Left lower leg: No edema.  Skin:    General: Skin is warm and dry.     Comments: A dime sized blood filled blister @ R heel.   Neurological:     General: No focal deficit present.     Mental Status: She is alert. Mental status is at baseline.     Motor: No weakness.     Coordination: Coordination normal.     Gait: Gait abnormal.     Comments: Oriented to self.   Psychiatric:        Mood and Affect: Mood normal.        Behavior: Behavior normal.     Labs reviewed: Recent Labs    06/29/19 0000 07/02/19 0000 07/05/19 0000  NA 131* 133* 134*  K 4.6 4.5 4.5  CL 99 100 103  CO2 22 26* 23*  BUN 22* 28* 11  CREATININE 0.6 0.7 0.6  CALCIUM 9.0 8.9 8.5*   Recent Labs    04/15/19 0000 06/22/19 0000 06/29/19 0000  AST 26 14 15   ALT 27 10 18   ALKPHOS 95 77 96  ALBUMIN 4.0  --  3.3*   Recent Labs    06/19/19 0000 06/29/19 0000 07/02/19 0000  WBC 5.0 7.4 9.5  NEUTROABS  --   --  6,365  HGB 13.8 13.1 13.9  HCT 41 38 41  PLT 234 343 335   Lab Results  Component Value Date   TSH 2.99  11/13/2017   Lab Results  Component Value Date   HGBA1C 5.8 06/29/2013   No results found for: CHOL, HDL, LDLCALC, LDLDIRECT, TRIG, CHOLHDL  Significant Diagnostic Results in last 30 days:  No results found.  Assessment/Plan Pressure ulcer of right heel 07/23/19 unstageable, blood filled blister, pressure reduction, float heels while in bed, skin prep to strengthen the area.   Vascular dementia ([place]) Continue SNF [facility] for safety, care assistance, w/c for mobility.   Hypertension Blood pressure, heart rate are controlled, continue Metoprolol.   Lumbar radiculopathy, chronic Stable, w/c for mobility, Tylenol daily for pain control.   Tachycardia Heart rate is in control, continue Metoprolol.   Iron deficiency anemia Stable, Hgb 13.9 06/27/19, not takign Iron.      Family/ staff Communication: plan of care reviewed with the patient and charge nurse.   Labs/tests ordered:   none  Time spend 25 minutes.

## 2019-07-23 NOTE — Assessment & Plan Note (Signed)
07/23/19 unstageable, blood filled blister, pressure reduction, float heels while in bed, skin prep to strengthen the area.

## 2019-07-23 NOTE — Assessment & Plan Note (Signed)
Continue SNF [facility] for safety, care assistance, w/c for mobility.

## 2019-07-28 DIAGNOSIS — G8911 Acute pain due to trauma: Secondary | ICD-10-CM | POA: Diagnosis not present

## 2019-07-28 DIAGNOSIS — Z8616 Personal history of COVID-19: Secondary | ICD-10-CM | POA: Diagnosis not present

## 2019-07-28 DIAGNOSIS — M6281 Muscle weakness (generalized): Secondary | ICD-10-CM | POA: Diagnosis not present

## 2019-07-28 DIAGNOSIS — H00014 Hordeolum externum left upper eyelid: Secondary | ICD-10-CM | POA: Diagnosis not present

## 2019-07-28 DIAGNOSIS — Z7389 Other problems related to life management difficulty: Secondary | ICD-10-CM | POA: Diagnosis not present

## 2019-07-28 DIAGNOSIS — M25552 Pain in left hip: Secondary | ICD-10-CM | POA: Diagnosis not present

## 2019-07-28 DIAGNOSIS — F329 Major depressive disorder, single episode, unspecified: Secondary | ICD-10-CM | POA: Diagnosis not present

## 2019-07-28 DIAGNOSIS — R278 Other lack of coordination: Secondary | ICD-10-CM | POA: Diagnosis not present

## 2019-07-28 DIAGNOSIS — R2681 Unsteadiness on feet: Secondary | ICD-10-CM | POA: Diagnosis not present

## 2019-07-28 DIAGNOSIS — R5383 Other fatigue: Secondary | ICD-10-CM | POA: Diagnosis not present

## 2019-07-28 DIAGNOSIS — N179 Acute kidney failure, unspecified: Secondary | ICD-10-CM | POA: Diagnosis not present

## 2019-07-28 DIAGNOSIS — R000 Tachycardia, unspecified: Secondary | ICD-10-CM | POA: Diagnosis not present

## 2019-07-28 DIAGNOSIS — R1312 Dysphagia, oropharyngeal phase: Secondary | ICD-10-CM | POA: Diagnosis not present

## 2019-07-28 DIAGNOSIS — D509 Iron deficiency anemia, unspecified: Secondary | ICD-10-CM | POA: Diagnosis not present

## 2019-07-28 DIAGNOSIS — H04123 Dry eye syndrome of bilateral lacrimal glands: Secondary | ICD-10-CM | POA: Diagnosis not present

## 2019-07-28 DIAGNOSIS — R296 Repeated falls: Secondary | ICD-10-CM | POA: Diagnosis not present

## 2019-07-28 DIAGNOSIS — Z9181 History of falling: Secondary | ICD-10-CM | POA: Diagnosis not present

## 2019-07-28 DIAGNOSIS — I10 Essential (primary) hypertension: Secondary | ICD-10-CM | POA: Diagnosis not present

## 2019-07-28 DIAGNOSIS — J1289 Other viral pneumonia: Secondary | ICD-10-CM | POA: Diagnosis not present

## 2019-07-28 DIAGNOSIS — J42 Unspecified chronic bronchitis: Secondary | ICD-10-CM | POA: Diagnosis not present

## 2019-07-28 DIAGNOSIS — F418 Other specified anxiety disorders: Secondary | ICD-10-CM | POA: Diagnosis not present

## 2019-07-28 DIAGNOSIS — R21 Rash and other nonspecific skin eruption: Secondary | ICD-10-CM | POA: Diagnosis not present

## 2019-07-29 ENCOUNTER — Non-Acute Institutional Stay (SKILLED_NURSING_FACILITY): Payer: Medicare Other | Admitting: Internal Medicine

## 2019-07-29 ENCOUNTER — Encounter: Payer: Self-pay | Admitting: Internal Medicine

## 2019-07-29 DIAGNOSIS — I10 Essential (primary) hypertension: Secondary | ICD-10-CM | POA: Diagnosis not present

## 2019-07-29 DIAGNOSIS — R2681 Unsteadiness on feet: Secondary | ICD-10-CM | POA: Diagnosis not present

## 2019-07-29 DIAGNOSIS — R630 Anorexia: Secondary | ICD-10-CM | POA: Diagnosis not present

## 2019-07-29 DIAGNOSIS — F339 Major depressive disorder, recurrent, unspecified: Secondary | ICD-10-CM

## 2019-07-29 DIAGNOSIS — Z9181 History of falling: Secondary | ICD-10-CM | POA: Diagnosis not present

## 2019-07-29 DIAGNOSIS — Z8616 Personal history of COVID-19: Secondary | ICD-10-CM | POA: Diagnosis not present

## 2019-07-29 DIAGNOSIS — D509 Iron deficiency anemia, unspecified: Secondary | ICD-10-CM

## 2019-07-29 DIAGNOSIS — R1312 Dysphagia, oropharyngeal phase: Secondary | ICD-10-CM | POA: Diagnosis not present

## 2019-07-29 DIAGNOSIS — M6281 Muscle weakness (generalized): Secondary | ICD-10-CM | POA: Diagnosis not present

## 2019-07-29 NOTE — Progress Notes (Signed)
Location:  [place] Room Number: 42B Place of Service:  SNF (31) Provider: Meredith Staggers L. Lyndel Safe, MD   [name], MD  Patient Care Team: [name], MD as PCP - General (Internal Medicine) [name], MD as Referring Physician (Ophthalmology) Mast, Man X, NP as Nurse Practitioner (Internal Medicine)  Extended Emergency Contact Information Primary Emergency Contact: [name] Address: [address]          [place], [place] [address] [name] of [place] Phone: [phone] Mobile Phone: [phone] Relation: Son Secondary Emergency Contact: [name] Address: [address]          [place], [place] [address] [name] of [place] Phone: [phone] Mobile Phone: [phone] Relation: Son  Code Status:  DNR Goals of care: Advanced Directive information Advanced Directives 07/29/2019  Does Patient Have a Medical Advance Directive? Yes  Type of Advance Directive Out of facility DNR (pink MOST or yellow form)  Does patient want to make changes to medical advance directive? No - Patient declined  Copy of [place] in Chart? -  Would patient like information on creating a medical advance directive? -  Pre-existing out of facility DNR order (yellow form or pink MOST form) Yellow form placed in chart (order not valid for inpatient use);Pink MOST form placed in chart (order not valid for inpatient use)     Chief Complaint  Patient presents with  . Medical Management of Chronic Issues    Routine Visit    HPI:  Pt is a 86 y.o. female seen today for medical management of chronic diseases.    Patient has a history ofHypertension, anemia, depression,Hyperlipidemia,COPD, hyponatremia,Depression with Anxiety and Dementia Patient is a long-term resident of facility She Got Diagnosed with Covid.And Pneumonia Treated with IV fluids and Doxycyline with Dexamethasone  She has been stable since  then. But her Main Problem continue to be poor appetite. She lost 10 lbs since the Covid diagnosis. Recently her weight is stable She says she has no taste and no desire to eat. She has not had any cough or SOB. Moves in her wheelchair. Did not have any c/o Weakness  Past Medical History:  Diagnosis Date  . Anemia, iron deficiency    takes Ferrous Sulfate daily  . Depression    takes Effexor daily  . Gait disorder 04/21/2014  . GERD (gastroesophageal reflux disease)    takes Omeprazole daily  . Herpes ocular    history of-takes Acyclovir daily  . High cholesterol    takes Atorvastatin daily  . History of hiatal hernia   . Hypertension    takes Metoprolol daily  . Insomnia    takes Xanax nightly  . Osteoarthritis of knee    bilateral  . Sciatic pain    Past Surgical History:  Procedure Laterality Date  . ABDOMINAL HYSTERECTOMY  1995   partial  . CARPAL TUNNEL RELEASE Right 07/21/2007  . CARPAL TUNNEL RELEASE Left 09/24/2007  . COLONOSCOPY    . DESCEMETS STRIPPING AUTOMATED ENDOTHELIAL KERATOPLASTY Left 03/14/2011  . DESCEMETS STRIPPING AUTOMATED ENDOTHELIAL KERATOPLASTY Right 08/20/2012  . EYE SURGERY Bilateral    cataract surgery  . EYE SURGERY Left    corneal transplant  . HERNIA REPAIR    . HIP ARTHROPLASTY Left 06/13/2013   Procedure: ARTHROPLASTY BIPOLAR HIP; Injection left shoulder;  Surgeon: Johnny Bridge, MD;  Location: [facility];  Service: Orthopedics;  Laterality: Left;  . JOINT REPLACEMENT     bilateral knees, left  knee  . KNEE ARTHROSCOPY Right 05/11/2001  . KYPHOPLASTY N/A 04/10/2015   Procedure: T11 Kyphoplasty;  Surgeon: Jovita Gamma, MD;  Location: [facility];  Service: Neurosurgery;  Laterality: N/A;  T11 Kyphoplasty  . KYPHOPLASTY N/A 05/06/2015   Procedure: KYPHOPLASTY Thoracic twelve;  Surgeon: Jovita Gamma, MD;  Location: [facility];  Service: Neurosurgery;  Laterality: N/A;  T12 Kyphoplasty  . LAPAROSCOPIC NISSEN FUNDOPLICATION  [id number]  .  LUMBAR LAMINECTOMY/DECOMPRESSION MICRODISCECTOMY  08/29/2010   L2-S1  . SHOULDER ARTHROSCOPY WITH ROTATOR CUFF REPAIR AND SUBACROMIAL DECOMPRESSION Right 01/01/2000  . TONSILLECTOMY     as child  . TOTAL HIP REVISION Left 07/08/2014   Procedure: TOTAL HIP REVISION;  Surgeon: Kerin Salen, MD;  Location: [facility];  Service: Orthopedics;  Laterality: Left;  . TOTAL KNEE ARTHROPLASTY Left 05/14/2004  . TOTAL KNEE ARTHROPLASTY  12/23/2011   Procedure: TOTAL KNEE ARTHROPLASTY;  Surgeon: Lorn Junes, MD;  Location: [facility];  Service: Orthopedics;  Laterality: Right;  DR [place] THIS CASE  . TRIGGER FINGER RELEASE Right 07/21/2007   thumb  . TRIGGER FINGER RELEASE Left 09/24/2007   middle finger  . TRIGGER FINGER RELEASE Right 03/10/2008   ring and little fingers  . TRIGGER FINGER RELEASE Right 09/02/2012   Procedure: RELEASE TRIGGER FINGER/A-1 PULLEY RIGHT INDEX FINGER;  Surgeon: Wynonia Sours, MD;  Location: [facility];  Service: Orthopedics;  Laterality: Right;  . TRIGGER FINGER RELEASE Left 12/22/2012   Procedure: RELEASE TRIGGER FINGER/A-1 PULLEY LEFT RING FINGER;  Surgeon: Wynonia Sours, MD;  Location: [facility];  Service: Orthopedics;  Laterality: Left;  Left     Allergies  Allergen Reactions  . Morphine And Related Other (See Comments)    AGITATION, STRANGE DREAMS  . Scopolamine Other (See Comments)    MENTAL CHANGES  . Atorvastatin Other (See Comments)    unknown  . Morphine Other (See Comments)    Disoriented, mood changes  . Nsaids Other (See Comments)    unknown  . Pravachol [Pravastatin Sodium] Other (See Comments)    unknown  . Pravastatin Other (See Comments)    unknown  . Teriparatide Other (See Comments)    unknown    Outpatient Encounter Medications as of 07/29/2019  Medication Sig  . acetaminophen (TYLENOL) 500 MG tablet Take 500 mg by mouth every 6 (six) hours as needed for mild pain or moderate pain. Also give 1 tablet  500  mg tab by mouth at bedtime   Non Aspirin NOT TO EXCEED 3,000 MG ACETAMINOPHEN IN 24 HRS (SCHEDULED + PRN DOSES).  Marland Kitchen acetaminophen (TYLENOL) 500 MG tablet Take 500 mg by mouth at bedtime.  Marland Kitchen ascorbic acid (VITAMIN C) 500 MG tablet Take 500 mg by mouth daily.  Marland Kitchen aspirin EC 81 MG tablet Take 81 mg by mouth daily.   . Cholecalciferol (VITAMIN D) 2000 units CAPS Take 2,000 Units by mouth daily.  Marland Kitchen docusate sodium (COLACE) 100 MG capsule Take 1 capsule by mouth daily as needed for mild constipation.   . feeding supplement (BOOST / RESOURCE BREEZE) LIQD Take 1 Container by mouth 3 (three) times daily. For weight loss   . fluticasone furoate-vilanterol (BREO ELLIPTA) 100-25 MCG/INH AEPB Inhale 1 puff into the lungs daily.  . Glycerin-Hypromellose-PEG 400 (ARTIFICIAL TEARS) 0.2-0.2-1 % SOLN Place 1 drop into both eyes 4 (four) times daily.  . magnesium hydroxide (MILK OF MAGNESIA) 400 MG/5ML suspension Take 15 mLs by mouth at bedtime  as needed for mild constipation.   . metoprolol tartrate (LOPRESSOR) 25 MG tablet Take 25 mg by mouth at bedtime.   . metoprolol tartrate (LOPRESSOR) 50 MG tablet Take 50 mg by mouth daily. DAILY 07:00 AM - 10:00 AM  . nitroGLYCERIN (NITROSTAT) 0.4 MG SL tablet Place 0.4 mg under the tongue every 5 (five) minutes as needed for chest pain.  . NON FORMULARY APPLY VASELINE TO ARMS AND LEGS DAILY WITH AM CARE,CNA TO APPLY Once A Day  . NON FORMULARY Incontinent Care: apply Vaseline to buttocks after each incontinent episode Every Shift  . NON FORMULARY Moisturizing Cream to extremities with morning and evening care Every Shift  . NON FORMULARY Special Instructions: Regular diet with thin liquids  . omeprazole (PRILOSEC) 10 MG capsule Take 10 mg by mouth daily.  Dema Severin Petrolatum-Mineral Oil (SYSTANE NIGHTTIME) OINT Apply to both eyes at bedtime  . zinc sulfate 220 (50 Zn) MG capsule Take 220 mg by mouth daily.  . [DISCONTINUED] METOPROLOL TARTRATE PO Take 50 mg by mouth  daily.  . [DISCONTINUED] OXYGEN Inhale into the lungs. @@2L  TO KEEP SAT'S GREATER THAN OR EQUAL 88-90% Every Shift   No facility-administered encounter medications on file as of 07/29/2019.    Review of Systems  Constitutional: Positive for appetite change.  HENT: Negative.   Respiratory: Negative.   Cardiovascular: Negative.   Genitourinary: Negative.   Musculoskeletal: Negative.   Skin: Negative.   Neurological: Positive for weakness.  Psychiatric/Behavioral: Positive for confusion.    Immunization History  Administered Date(s) Administered  . Influenza Whole 03/27/2018  . Influenza, High Dose Seasonal PF 03/26/2019  . Influenza-Unspecified 04/14/2017  . Pneumococcal Conjugate-13 09/24/2017  . Pneumococcal Polysaccharide-23 06/14/2013  . Td 09/25/2017  . Varicella 06/29/2003   Pertinent  Health Maintenance Due  Topic Date Due  . INFLUENZA VACCINE  Completed  . DEXA SCAN  Completed  . PNA vac Low Risk Adult  Completed   Fall Risk  09/19/2017  Falls in the past year? No   Functional Status Survey:    Vitals:   07/29/19 1502  BP: 120/80  Pulse: 78  Resp: 20  Temp: (!) 97.3 F (36.3 C)  TempSrc: Oral  SpO2: 97%  Weight: 104 lb (47.2 kg)  Height: 5\' 1"  (1.549 m)   Body mass index is 19.65 kg/m. Physical Exam Vitals reviewed.  HENT:     Head: Normocephalic.     Nose: Nose normal.     Mouth/Throat:     Mouth: Mucous membranes are moist.     Pharynx: Oropharynx is clear.  Eyes:     Pupils: Pupils are equal, round, and reactive to light.  Cardiovascular:     Rate and Rhythm: Normal rate.     Pulses: Normal pulses.     Heart sounds: Normal heart sounds.  Pulmonary:     Effort: Pulmonary effort is normal.     Breath sounds: Normal breath sounds.  Abdominal:     General: Abdomen is flat. Bowel sounds are normal.     Palpations: Abdomen is soft.  Musculoskeletal:        General: No swelling.     Cervical back: Normal range of motion and neck supple.    Skin:    General: Skin is warm.  Neurological:     Mental Status: She is alert.     Comments: No Oriented but Mental status at baseline  Psychiatric:        Mood and Affect: Mood normal.  Behavior: Behavior normal.     Labs reviewed: Recent Labs    06/29/19 0000 07/02/19 0000 07/05/19 0000  NA 131* 133* 134*  K 4.6 4.5 4.5  CL 99 100 103  CO2 22 26* 23*  BUN 22* 28* 11  CREATININE 0.6 0.7 0.6  CALCIUM 9.0 8.9 8.5*   Recent Labs    04/15/19 0000 06/22/19 0000 06/29/19 0000  AST 26 14 15   ALT 27 10 18   ALKPHOS 95 77 96  ALBUMIN 4.0  --  3.3*   Recent Labs    06/19/19 0000 06/29/19 0000 07/02/19 0000  WBC 5.0 7.4 9.5  NEUTROABS  --   --  6,365  HGB 13.8 13.1 13.9  HCT 41 38 41  PLT 234 343 335   Lab Results  Component Value Date   TSH 2.99 11/13/2017   Lab Results  Component Value Date   HGBA1C 5.8 06/29/2013     Significant Diagnostic Results in last 30 days:    Assessment/Plan  Poor Appetite Past Covid Will start her on Remeron 7.5 mg Family is thinking of Hospice if she is not better. She is Comfort care Fortunately her weight is stable Oxygen level staying above  90 % on Ra   Essential hypertensionwith Tachycardia Doing well on Lopressor GERD Continue on Prilosec Gait disorder Therapy just discharged her. Wheel chair dependent  Hyponatremia Last Sodium was 134 Iron deficiency anemia, Hgb 12.7 Took off her Iron to see if it helps her appetite Dementia Supportive care Mental status at baseline Namenda discontinued during Acute illness Will not restart yet  Depression Was on Effexor and ativan before was discontinued  Will try Remeron now H/o COPD On Breo Hyperlipidemia ? Allergic to Statin  Family/ staff Communication:   Labs/tests ordered:

## 2019-08-02 DIAGNOSIS — R2681 Unsteadiness on feet: Secondary | ICD-10-CM | POA: Diagnosis not present

## 2019-08-02 DIAGNOSIS — R1312 Dysphagia, oropharyngeal phase: Secondary | ICD-10-CM | POA: Diagnosis not present

## 2019-08-02 DIAGNOSIS — Z9181 History of falling: Secondary | ICD-10-CM | POA: Diagnosis not present

## 2019-08-02 DIAGNOSIS — I10 Essential (primary) hypertension: Secondary | ICD-10-CM | POA: Diagnosis not present

## 2019-08-02 DIAGNOSIS — M6281 Muscle weakness (generalized): Secondary | ICD-10-CM | POA: Diagnosis not present

## 2019-08-02 DIAGNOSIS — Z8616 Personal history of COVID-19: Secondary | ICD-10-CM | POA: Diagnosis not present

## 2019-08-03 DIAGNOSIS — I10 Essential (primary) hypertension: Secondary | ICD-10-CM | POA: Diagnosis not present

## 2019-08-03 DIAGNOSIS — D509 Iron deficiency anemia, unspecified: Secondary | ICD-10-CM | POA: Diagnosis not present

## 2019-08-03 LAB — BASIC METABOLIC PANEL
BUN: 14 (ref 4–21)
CO2: 26 — AB (ref 13–22)
Chloride: 108 (ref 99–108)
Creatinine: 0.6 (ref 0.5–1.1)
Glucose: 91
Potassium: 4.4 (ref 3.4–5.3)
Sodium: 139 (ref 137–147)

## 2019-08-03 LAB — HEPATIC FUNCTION PANEL
ALT: 19 (ref 7–35)
AST: 22 (ref 13–35)
Alkaline Phosphatase: 74 (ref 25–125)
Bilirubin, Total: 0.3

## 2019-08-03 LAB — CBC AND DIFFERENTIAL
HCT: 33 — AB (ref 36–46)
Hemoglobin: 11.2 — AB (ref 12.0–16.0)
Neutrophils Absolute: 1882
Platelets: 303 (ref 150–399)
WBC: 5.3

## 2019-08-03 LAB — COMPREHENSIVE METABOLIC PANEL
Albumin: 2.8 — AB (ref 3.5–5.0)
Calcium: 8.4 — AB (ref 8.7–10.7)
Globulin: 2.4

## 2019-08-03 LAB — CBC: RBC: 3.52 — AB (ref 3.87–5.11)

## 2019-08-04 DIAGNOSIS — I10 Essential (primary) hypertension: Secondary | ICD-10-CM | POA: Diagnosis not present

## 2019-08-04 DIAGNOSIS — Z9181 History of falling: Secondary | ICD-10-CM | POA: Diagnosis not present

## 2019-08-04 DIAGNOSIS — R2681 Unsteadiness on feet: Secondary | ICD-10-CM | POA: Diagnosis not present

## 2019-08-04 DIAGNOSIS — Z8616 Personal history of COVID-19: Secondary | ICD-10-CM | POA: Diagnosis not present

## 2019-08-04 DIAGNOSIS — R1312 Dysphagia, oropharyngeal phase: Secondary | ICD-10-CM | POA: Diagnosis not present

## 2019-08-04 DIAGNOSIS — M6281 Muscle weakness (generalized): Secondary | ICD-10-CM | POA: Diagnosis not present

## 2019-08-05 DIAGNOSIS — R1312 Dysphagia, oropharyngeal phase: Secondary | ICD-10-CM | POA: Diagnosis not present

## 2019-08-05 DIAGNOSIS — I10 Essential (primary) hypertension: Secondary | ICD-10-CM | POA: Diagnosis not present

## 2019-08-05 DIAGNOSIS — Z9181 History of falling: Secondary | ICD-10-CM | POA: Diagnosis not present

## 2019-08-05 DIAGNOSIS — M6281 Muscle weakness (generalized): Secondary | ICD-10-CM | POA: Diagnosis not present

## 2019-08-05 DIAGNOSIS — Z8616 Personal history of COVID-19: Secondary | ICD-10-CM | POA: Diagnosis not present

## 2019-08-05 DIAGNOSIS — R2681 Unsteadiness on feet: Secondary | ICD-10-CM | POA: Diagnosis not present

## 2019-08-10 DIAGNOSIS — Z9181 History of falling: Secondary | ICD-10-CM | POA: Diagnosis not present

## 2019-08-10 DIAGNOSIS — M6281 Muscle weakness (generalized): Secondary | ICD-10-CM | POA: Diagnosis not present

## 2019-08-10 DIAGNOSIS — R1312 Dysphagia, oropharyngeal phase: Secondary | ICD-10-CM | POA: Diagnosis not present

## 2019-08-10 DIAGNOSIS — Z8616 Personal history of COVID-19: Secondary | ICD-10-CM | POA: Diagnosis not present

## 2019-08-10 DIAGNOSIS — I10 Essential (primary) hypertension: Secondary | ICD-10-CM | POA: Diagnosis not present

## 2019-08-10 DIAGNOSIS — R2681 Unsteadiness on feet: Secondary | ICD-10-CM | POA: Diagnosis not present

## 2019-08-21 DIAGNOSIS — Z23 Encounter for immunization: Secondary | ICD-10-CM | POA: Diagnosis not present

## 2019-08-25 ENCOUNTER — Non-Acute Institutional Stay (SKILLED_NURSING_FACILITY): Payer: Medicare Other | Admitting: Nurse Practitioner

## 2019-08-25 ENCOUNTER — Encounter: Payer: Self-pay | Admitting: Nurse Practitioner

## 2019-08-25 DIAGNOSIS — M5416 Radiculopathy, lumbar region: Secondary | ICD-10-CM | POA: Diagnosis not present

## 2019-08-25 DIAGNOSIS — I10 Essential (primary) hypertension: Secondary | ICD-10-CM | POA: Diagnosis not present

## 2019-08-25 DIAGNOSIS — F339 Major depressive disorder, recurrent, unspecified: Secondary | ICD-10-CM | POA: Diagnosis not present

## 2019-08-25 DIAGNOSIS — F0150 Vascular dementia without behavioral disturbance: Secondary | ICD-10-CM | POA: Diagnosis not present

## 2019-08-25 DIAGNOSIS — K219 Gastro-esophageal reflux disease without esophagitis: Secondary | ICD-10-CM

## 2019-08-25 DIAGNOSIS — R000 Tachycardia, unspecified: Secondary | ICD-10-CM

## 2019-08-25 NOTE — Assessment & Plan Note (Signed)
Heart rate is in control, continue Metoprolol  

## 2019-08-25 NOTE — Assessment & Plan Note (Signed)
Stable, continue Omeprazole.  

## 2019-08-25 NOTE — Assessment & Plan Note (Signed)
Stable, continue Tylenol, w/c, activity as tolerated.

## 2019-08-25 NOTE — Assessment & Plan Note (Signed)
Blood pressure is controlled, continue Metoprolol. 

## 2019-08-25 NOTE — Assessment & Plan Note (Signed)
Her mood, weight are stable, continue Mirtazapine.

## 2019-08-25 NOTE — Progress Notes (Signed)
Location:   [facility] Room Number: 42 Place of Service:  SNF (31) Provider:  Chaunce Winkels NP  [name], MD  Patient Care Team: [name], MD as PCP - General (Internal Medicine) [name], MD as Referring Physician (Ophthalmology) Rhyanna Sorce X, NP as Nurse Practitioner (Internal Medicine)  Extended Emergency Contact Information Primary Emergency Contact: [name] Address: [address]          [place], [place] [address] [name] of [place] Phone: [phone] Mobile Phone: [phone] Relation: Son Secondary Emergency Contact: [name] Address: [address]          [place], [place] [address] [name] of [place] Phone: [phone] Mobile Phone: [phone] Relation: Son  Code Status:  DNR Goals of care: Advanced Directive information Advanced Directives 08/25/2019  Does Patient Have a Medical Advance Directive? Yes  Type of Advance Directive Out of facility DNR (pink MOST or yellow form)  Does patient want to make changes to medical advance directive? No - Patient declined  Copy of [place] in Chart? -  Would patient like information on creating a medical advance directive? -  Pre-existing out of facility DNR order (yellow form or pink MOST form) Yellow form placed in chart (order not valid for inpatient use);Pink MOST form placed in chart (order not valid for inpatient use)     Chief Complaint  Patient presents with  . Medical Management of Chronic Issues    HPI:  Pt is a 86 y.o. female seen today for medical management of chronic diseases.    The patient resides in SN[facility] for safety, care assistance, self propels w/c to get around, her mood is stable on Mirtazapine 7.5mg  qd. Hx of GERD, stable, on Omeprazole. Tachycardia, heart rate is in control, on Metoprolol 25mg  qd, 50mg  qd. Lower back pain, stable, on Tylenol 500mg  qd, w/c, activity as tolerated. HTN blood pressure is  controlled on Metoprolol.    Past Medical History:  Diagnosis Date  . Anemia, iron deficiency    takes Ferrous Sulfate daily  . Depression    takes Effexor daily  . Gait disorder 04/21/2014  . GERD (gastroesophageal reflux disease)    takes Omeprazole daily  . Herpes ocular    history of-takes Acyclovir daily  . High cholesterol    takes Atorvastatin daily  . History of hiatal hernia   . Hypertension    takes Metoprolol daily  . Insomnia    takes Xanax nightly  . Osteoarthritis of knee    bilateral  . Sciatic pain    Past Surgical History:  Procedure Laterality Date  . ABDOMINAL HYSTERECTOMY  1995   partial  . CARPAL TUNNEL RELEASE Right 07/21/2007  . CARPAL TUNNEL RELEASE Left 09/24/2007  . COLONOSCOPY    . DESCEMETS STRIPPING AUTOMATED ENDOTHELIAL KERATOPLASTY Left 03/14/2011  . DESCEMETS STRIPPING AUTOMATED ENDOTHELIAL KERATOPLASTY Right 08/20/2012  . EYE SURGERY Bilateral    cataract surgery  . EYE SURGERY Left    corneal transplant  . HERNIA REPAIR    . HIP ARTHROPLASTY Left 06/13/2013   Procedure: ARTHROPLASTY BIPOLAR HIP; Injection left shoulder;  Surgeon: Johnny Bridge, MD;  Location: [facility];  Service: Orthopedics;  Laterality: Left;  . JOINT REPLACEMENT     bilateral knees, left knee  . KNEE ARTHROSCOPY Right 05/11/2001  . KYPHOPLASTY N/A 04/10/2015   Procedure: T11 Kyphoplasty;  Surgeon: Jovita Gamma, MD;  Location: [facility];  Service: Neurosurgery;  Laterality: N/A;  T11 Kyphoplasty  . KYPHOPLASTY N/A 05/06/2015   Procedure: KYPHOPLASTY Thoracic twelve;  Surgeon: Jovita Gamma, MD;  Location: [facility];  Service: Neurosurgery;  Laterality: N/A;  T12 Kyphoplasty  . LAPAROSCOPIC NISSEN FUNDOPLICATION  [id number]  . LUMBAR LAMINECTOMY/DECOMPRESSION MICRODISCECTOMY  08/29/2010   L2-S1  . SHOULDER ARTHROSCOPY WITH ROTATOR CUFF REPAIR AND SUBACROMIAL DECOMPRESSION Right 01/01/2000  . TONSILLECTOMY     as child  . TOTAL HIP REVISION Left 07/08/2014    Procedure: TOTAL HIP REVISION;  Surgeon: Kerin Salen, MD;  Location: [facility];  Service: Orthopedics;  Laterality: Left;  . TOTAL KNEE ARTHROPLASTY Left 05/14/2004  . TOTAL KNEE ARTHROPLASTY  12/23/2011   Procedure: TOTAL KNEE ARTHROPLASTY;  Surgeon: Lorn Junes, MD;  Location: [facility];  Service: Orthopedics;  Laterality: Right;  DR Hewitt THIS CASE  . TRIGGER FINGER RELEASE Right 07/21/2007   thumb  . TRIGGER FINGER RELEASE Left 09/24/2007   middle finger  . TRIGGER FINGER RELEASE Right 03/10/2008   ring and little fingers  . TRIGGER FINGER RELEASE Right 09/02/2012   Procedure: RELEASE TRIGGER FINGER/A-1 PULLEY RIGHT INDEX FINGER;  Surgeon: Wynonia Sours, MD;  Location: [facility];  Service: Orthopedics;  Laterality: Right;  . TRIGGER FINGER RELEASE Left 12/22/2012   Procedure: RELEASE TRIGGER FINGER/A-1 PULLEY LEFT RING FINGER;  Surgeon: Wynonia Sours, MD;  Location: [facility];  Service: Orthopedics;  Laterality: Left;  Left     Allergies  Allergen Reactions  . Morphine And Related Other (See Comments)    AGITATION, STRANGE DREAMS  . Scopolamine Other (See Comments)    MENTAL CHANGES  . Atorvastatin Other (See Comments)    unknown  . Morphine Other (See Comments)    Disoriented, mood changes  . Nsaids Other (See Comments)    unknown  . Pravachol [Pravastatin Sodium] Other (See Comments)    unknown  . Pravastatin Other (See Comments)    unknown  . Teriparatide Other (See Comments)    unknown    Allergies as of 08/25/2019      Reactions   Morphine And Related Other (See Comments)   AGITATION, STRANGE DREAMS   Scopolamine Other (See Comments)   MENTAL CHANGES   Atorvastatin Other (See Comments)   unknown   Morphine Other (See Comments)   Disoriented, mood changes   Nsaids Other (See Comments)   unknown   Pravachol [pravastatin Sodium] Other (See Comments)   unknown   Pravastatin Other (See Comments)   unknown   Teriparatide  Other (See Comments)   unknown      Medication List       Accurate as of August 25, 2019  4:37 PM. If you have any questions, ask your nurse or doctor.        STOP taking these medications   ascorbic acid 500 MG tablet Commonly known as: VITAMIN C Stopped by: Derrell Milanes X Khalis Hittle, NP     TAKE these medications   acetaminophen 500 MG tablet Commonly known as: TYLENOL Take 500 mg by mouth every 6 (six) hours as needed for mild pain or moderate pain. Also give 1 tablet  500 mg tab by mouth at bedtime   Non Aspirin NOT TO EXCEED 3,000 MG ACETAMINOPHEN IN 24 HRS (SCHEDULED + PRN DOSES).   acetaminophen 500 MG tablet Commonly known as: TYLENOL Take 500 mg by mouth at bedtime.   Artificial Tears 0.2-0.2-1 % Soln Generic drug: Glycerin-Hypromellose-PEG 400 Place 1 drop into  both eyes 4 (four) times daily.   aspirin EC 81 MG tablet Take 81 mg by mouth daily.   Breo Ellipta 100-25 MCG/INH Aepb Generic drug: fluticasone furoate-vilanterol Inhale 1 puff into the lungs daily.   cholecalciferol 25 MCG (1000 UNIT) tablet Commonly known as: VITAMIN D3 Take 1,000 Units by mouth daily.   docusate sodium 100 MG capsule Commonly known as: COLACE Take 1 capsule by mouth daily as needed for mild constipation.   feeding supplement Liqd Commonly known as: BOOST / RESOURCE BREEZE Take 1 Container by mouth 3 (three) times daily. For weight loss   magnesium hydroxide 400 MG/5ML suspension Commonly known as: MILK OF MAGNESIA Take 15 mLs by mouth at bedtime as needed for mild constipation.   metoprolol tartrate 25 MG tablet Commonly known as: LOPRESSOR Take 25 mg by mouth at bedtime.   metoprolol tartrate 50 MG tablet Commonly known as: LOPRESSOR Take 50 mg by mouth daily. DAILY 07:00 AM - 10:00 AM   mirtazapine 7.5 MG tablet Commonly known as: REMERON Take 7.5 mg by mouth at bedtime.   nitroGLYCERIN 0.4 MG SL tablet Commonly known as: NITROSTAT Place 0.4 mg under the tongue every 5 (five)  minutes as needed for chest pain.   NON FORMULARY APPLY VASELINE TO ARMS AND LEGS DAILY WITH AM CARE,CNA TO APPLY Once A Day   NON FORMULARY Incontinent Care: apply Vaseline to buttocks after each incontinent episode Every Shift   NON FORMULARY Moisturizing Cream to extremities with morning and evening care Every Shift   NON FORMULARY Special Instructions: Regular diet with thin liquids   omeprazole 10 MG capsule Commonly known as: PRILOSEC Take 10 mg by mouth daily.   Systane Nighttime Oint Apply to both eyes at bedtime      ROS was provided with assistance of staff.  Review of Systems  Constitutional: Negative for activity change, appetite change, chills, diaphoresis, fatigue, fever and unexpected weight change.  HENT: Positive for hearing loss. Negative for congestion and voice change.   Eyes: Negative for visual disturbance.       Chronic left upper lid stye.   Respiratory: Negative for cough, shortness of breath and wheezing.   Cardiovascular: Negative for chest pain, palpitations and leg swelling.  Gastrointestinal: Negative for abdominal distention, abdominal pain, constipation, nausea and vomiting.  Genitourinary: Negative for difficulty urinating, dysuria and urgency.  Musculoskeletal: Positive for arthralgias, back pain and gait problem.  Skin: Negative for color change and pallor.  Neurological: Negative for dizziness, speech difficulty, weakness and headaches.       Dementia  Psychiatric/Behavioral: Positive for confusion. Negative for agitation, behavioral problems and sleep disturbance. The patient is not nervous/anxious.     Immunization History  Administered Date(s) Administered  . Influenza Whole 03/27/2018  . Influenza, High Dose Seasonal PF 03/26/2019  . Influenza-Unspecified 04/14/2017  . Moderna SARS-COVID-2 Vaccination 07/24/2019, 08/21/2019  . Pneumococcal Conjugate-13 09/24/2017  . Pneumococcal Polysaccharide-23 06/14/2013  . Td 09/25/2017    . Varicella 06/29/2003   Pertinent  Health Maintenance Due  Topic Date Due  . INFLUENZA VACCINE  Completed  . DEXA SCAN  Completed  . PNA vac Low Risk Adult  Completed   Fall Risk  09/19/2017  Falls in the past year? No   Functional Status Survey:    Vitals:   08/25/19 1013  BP: 120/60  Pulse: 86  Resp: 20  Temp: 98 F (36.7 C)  SpO2: 96%  Weight: 105 lb 1.6 oz (47.7 kg)  Height: 5\' 1"  (  1.549 m)   Body mass index is 19.86 kg/m. Physical Exam Vitals and nursing note reviewed.  Constitutional:      General: She is not in acute distress.    Appearance: Normal appearance. She is not ill-appearing.  HENT:     Head: Normocephalic and atraumatic.     Mouth/Throat:     Mouth: Mucous membranes are moist.  Eyes:     Extraocular Movements: Extraocular movements intact.     Conjunctiva/sclera: Conjunctivae normal.     Pupils: Pupils are equal, round, and reactive to light.  Cardiovascular:     Rate and Rhythm: Normal rate and regular rhythm.     Heart sounds: No murmur.  Pulmonary:     Breath sounds: No wheezing, rhonchi or rales.  Abdominal:     General: Bowel sounds are normal. There is no distension.     Palpations: Abdomen is soft.     Tenderness: There is no abdominal tenderness. There is no guarding or rebound.  Musculoskeletal:     Cervical back: Normal range of motion and neck supple.     Right lower leg: No edema.     Left lower leg: No edema.  Skin:    General: Skin is warm and dry.  Neurological:     General: No focal deficit present.     Mental Status: She is alert. Mental status is at baseline.     Motor: No weakness.     Coordination: Coordination normal.     Gait: Gait abnormal.     Comments: Oriented to self.   Psychiatric:        Mood and Affect: Mood normal.        Behavior: Behavior normal.     Labs reviewed: Recent Labs    07/02/19 0000 07/05/19 0000 08/03/19 0000  NA 133* 134* 139  K 4.5 4.5 4.4  CL 100 103 108  CO2 26* 23* 26*   BUN 28* 11 14  CREATININE 0.7 0.6 0.6  CALCIUM 8.9 8.5* 8.4*   Recent Labs    04/15/19 0000 04/15/19 0000 06/22/19 0000 06/29/19 0000 08/03/19 0000  AST 26   < > 14 15 22   ALT 27   < > 10 18 19   ALKPHOS 95   < > 77 96 74  ALBUMIN 4.0  --   --  3.3* 2.8*   < > = values in this interval not displayed.   Recent Labs    06/29/19 0000 07/02/19 0000 08/03/19 0000  WBC 7.4 9.5 5.3  NEUTROABS  --  6,365 1,882  HGB 13.1 13.9 11.2*  HCT 38 41 33*  PLT 343 335 303   Lab Results  Component Value Date   TSH 2.99 11/13/2017   Lab Results  Component Value Date   HGBA1C 5.8 06/29/2013   No results found for: CHOL, HDL, LDLCALC, LDLDIRECT, TRIG, CHOLHDL  Significant Diagnostic Results in last 30 days:  No results found.  Assessment/Plan Hypertension Blood pressure is controlled, continue Metoprolol.   GERD (gastroesophageal reflux disease) Stable, continue Omeprazole.   Lumbar radiculopathy, chronic Stable, continue Tylenol, w/c, activity as tolerated.   Vascular dementia [facility]) Functioning well in SNF [facility], continue self propels w/c to get around.   Depression, recurrent ([place]) Her mood, weight are stable, continue Mirtazapine.   Tachycardia Heart rate is in control, continue Metoprolol.      Family/ staff Communication: plan of care reviewed with the patient and charge nurse.   Labs/tests ordered:  none  Time  spend 25 minutes.

## 2019-08-25 NOTE — Assessment & Plan Note (Signed)
Functioning well in SNF [facility], continue self propels w/c to get around.

## 2019-09-08 DIAGNOSIS — Z20828 Contact with and (suspected) exposure to other viral communicable diseases: Secondary | ICD-10-CM | POA: Diagnosis not present

## 2019-09-16 ENCOUNTER — Non-Acute Institutional Stay (SKILLED_NURSING_FACILITY): Payer: Medicare Other | Admitting: Nurse Practitioner

## 2019-09-16 ENCOUNTER — Encounter: Payer: Self-pay | Admitting: Nurse Practitioner

## 2019-09-16 DIAGNOSIS — F339 Major depressive disorder, recurrent, unspecified: Secondary | ICD-10-CM | POA: Diagnosis not present

## 2019-09-16 DIAGNOSIS — M1990 Unspecified osteoarthritis, unspecified site: Secondary | ICD-10-CM | POA: Diagnosis not present

## 2019-09-16 DIAGNOSIS — K219 Gastro-esophageal reflux disease without esophagitis: Secondary | ICD-10-CM

## 2019-09-16 DIAGNOSIS — R000 Tachycardia, unspecified: Secondary | ICD-10-CM | POA: Diagnosis not present

## 2019-09-16 DIAGNOSIS — J42 Unspecified chronic bronchitis: Secondary | ICD-10-CM | POA: Diagnosis not present

## 2019-09-16 DIAGNOSIS — L989 Disorder of the skin and subcutaneous tissue, unspecified: Secondary | ICD-10-CM

## 2019-09-16 DIAGNOSIS — I10 Essential (primary) hypertension: Secondary | ICD-10-CM | POA: Diagnosis not present

## 2019-09-16 DIAGNOSIS — M159 Polyosteoarthritis, unspecified: Secondary | ICD-10-CM | POA: Insufficient documentation

## 2019-09-16 NOTE — Assessment & Plan Note (Signed)
Blood pressure is controlled, continue Metoprolol. 

## 2019-09-16 NOTE — Progress Notes (Signed)
Location:   [facility] Room Number: 42 Place of Service:  SNF (31) Provider:  Marah Park NP  [name], MD  Patient Care Team: [name], MD as PCP - General (Internal Medicine) [name], MD as Referring Physician (Ophthalmology) Carlos Quackenbush X, NP as Nurse Practitioner (Internal Medicine)  Extended Emergency Contact Information Primary Emergency Contact: [name] Address: [address]          [place], [place] [address] [name] of [place] Phone: [phone] Mobile Phone: [phone] Relation: Son Secondary Emergency Contact: [name] Address: [address]          [place], [place] [address] [name] of [place] Phone: [phone] Mobile Phone: [phone] Relation: Son  Code Status:  DNR Goals of care: Advanced Directive information Advanced Directives 09/16/2019  Does Patient Have a Medical Advance Directive? Yes  Type of Advance Directive Out of facility DNR (pink MOST or yellow form)  Does patient want to make changes to medical advance directive? No - Patient declined  Copy of [place] in Chart? -  Would patient like information on creating a medical advance directive? -  Pre-existing out of facility DNR order (yellow form or pink MOST form) Yellow form placed in chart (order not valid for inpatient use);Pink MOST form placed in chart (order not valid for inpatient use)     Chief Complaint  Patient presents with  . Acute Visit    Skin lesion on scalp    HPI:  Pt is a 86 y.o. female seen today for an acute visit for skin lesion on the top of scalp, about a dime sized, appears abrasio, but no active bleeding or s/s of infection, onset and duration are uncertain. The patient denied itching or pain.   The patient resides in SNF [facility] for safety, care assistance, w/c for mobility. Hx of OA in general, on Tylenol 500mg  qhs. COPD, stable, on Breo Ellipta qd, her mood is stable,  on Mirtazapine 7.5mg  qd. GERD stable, on Omeprazole 10mg  qd. Tachycardia, blood pressure are controlled on Metoprolol 25mg  qd, 50mg  qd.    Past Medical History:  Diagnosis Date  . Anemia, iron deficiency    takes Ferrous Sulfate daily  . Depression    takes Effexor daily  . Gait disorder 04/21/2014  . GERD (gastroesophageal reflux disease)    takes Omeprazole daily  . Herpes ocular    history of-takes Acyclovir daily  . High cholesterol    takes Atorvastatin daily  . History of hiatal hernia   . Hypertension    takes Metoprolol daily  . Insomnia    takes Xanax nightly  . Osteoarthritis of knee    bilateral  . Sciatic pain    Past Surgical History:  Procedure Laterality Date  . ABDOMINAL HYSTERECTOMY  1995   partial  . CARPAL TUNNEL RELEASE Right 07/21/2007  . CARPAL TUNNEL RELEASE Left 09/24/2007  . COLONOSCOPY    . DESCEMETS STRIPPING AUTOMATED ENDOTHELIAL KERATOPLASTY Left 03/14/2011  . DESCEMETS STRIPPING AUTOMATED ENDOTHELIAL KERATOPLASTY Right 08/20/2012  . EYE SURGERY Bilateral    cataract surgery  . EYE SURGERY Left    corneal transplant  . HERNIA REPAIR    . HIP ARTHROPLASTY Left 06/13/2013   Procedure: ARTHROPLASTY BIPOLAR HIP; Injection left shoulder;  Surgeon: Johnny Bridge, MD;  Location: [facility];  Service: Orthopedics;  Laterality: Left;  . JOINT REPLACEMENT     bilateral knees, left knee  . KNEE ARTHROSCOPY Right 05/11/2001  .  KYPHOPLASTY N/A 04/10/2015   Procedure: T11 Kyphoplasty;  Surgeon: Jovita Gamma, MD;  Location: [facility];  Service: Neurosurgery;  Laterality: N/A;  T11 Kyphoplasty  . KYPHOPLASTY N/A 05/06/2015   Procedure: KYPHOPLASTY Thoracic twelve;  Surgeon: Jovita Gamma, MD;  Location: [facility];  Service: Neurosurgery;  Laterality: N/A;  T12 Kyphoplasty  . LAPAROSCOPIC NISSEN FUNDOPLICATION  [id number]  . LUMBAR LAMINECTOMY/DECOMPRESSION MICRODISCECTOMY  08/29/2010   L2-S1  . SHOULDER ARTHROSCOPY WITH ROTATOR CUFF REPAIR AND  SUBACROMIAL DECOMPRESSION Right 01/01/2000  . TONSILLECTOMY     as child  . TOTAL HIP REVISION Left 07/08/2014   Procedure: TOTAL HIP REVISION;  Surgeon: Kerin Salen, MD;  Location: [facility];  Service: Orthopedics;  Laterality: Left;  . TOTAL KNEE ARTHROPLASTY Left 05/14/2004  . TOTAL KNEE ARTHROPLASTY  12/23/2011   Procedure: TOTAL KNEE ARTHROPLASTY;  Surgeon: Lorn Junes, MD;  Location: [facility];  Service: Orthopedics;  Laterality: Right;  DR [place] THIS CASE  . TRIGGER FINGER RELEASE Right 07/21/2007   thumb  . TRIGGER FINGER RELEASE Left 09/24/2007   middle finger  . TRIGGER FINGER RELEASE Right 03/10/2008   ring and little fingers  . TRIGGER FINGER RELEASE Right 09/02/2012   Procedure: RELEASE TRIGGER FINGER/A-1 PULLEY RIGHT INDEX FINGER;  Surgeon: Wynonia Sours, MD;  Location: [facility];  Service: Orthopedics;  Laterality: Right;  . TRIGGER FINGER RELEASE Left 12/22/2012   Procedure: RELEASE TRIGGER FINGER/A-1 PULLEY LEFT RING FINGER;  Surgeon: Wynonia Sours, MD;  Location: [facility];  Service: Orthopedics;  Laterality: Left;  Left     Allergies  Allergen Reactions  . Morphine And Related Other (See Comments)    AGITATION, STRANGE DREAMS  . Scopolamine Other (See Comments)    MENTAL CHANGES  . Atorvastatin Other (See Comments)    unknown  . Morphine Other (See Comments)    Disoriented, mood changes  . Nsaids Other (See Comments)    unknown  . Pravachol [Pravastatin Sodium] Other (See Comments)    unknown  . Pravastatin Other (See Comments)    unknown  . Teriparatide Other (See Comments)    unknown    Allergies as of 09/16/2019      Reactions   Morphine And Related Other (See Comments)   AGITATION, STRANGE DREAMS   Scopolamine Other (See Comments)   MENTAL CHANGES   Atorvastatin Other (See Comments)   unknown   Morphine Other (See Comments)   Disoriented, mood changes   Nsaids Other (See Comments)   unknown   Pravachol  [pravastatin Sodium] Other (See Comments)   unknown   Pravastatin Other (See Comments)   unknown   Teriparatide Other (See Comments)   unknown      Medication List       Accurate as of September 16, 2019  4:49 PM. If you have any questions, ask your nurse or doctor.        acetaminophen 500 MG tablet Commonly known as: TYLENOL Take 500 mg by mouth every 6 (six) hours as needed for mild pain or moderate pain. Also give 1 tablet  500 mg tab by mouth at bedtime   Non Aspirin NOT TO EXCEED 3,000 MG ACETAMINOPHEN IN 24 HRS (SCHEDULED + PRN DOSES).   acetaminophen 500 MG tablet Commonly known as: TYLENOL Take 500 mg by mouth at bedtime.   Artificial Tears 0.2-0.2-1 % Soln Generic drug: Glycerin-Hypromellose-PEG 400 Place 1 drop into both eyes 4 (four) times daily.  aspirin EC 81 MG tablet Take 81 mg by mouth daily.   Breo Ellipta 100-25 MCG/INH Aepb Generic drug: fluticasone furoate-vilanterol Inhale 1 puff into the lungs daily.   cholecalciferol 25 MCG (1000 UNIT) tablet Commonly known as: VITAMIN D3 Take 1,000 Units by mouth daily.   docusate sodium 100 MG capsule Commonly known as: COLACE Take 1 capsule by mouth daily as needed for mild constipation.   feeding supplement Liqd Commonly known as: BOOST / RESOURCE BREEZE Take 1 Container by mouth 3 (three) times daily. For weight loss   magnesium hydroxide 400 MG/5ML suspension Commonly known as: MILK OF MAGNESIA Take 15 mLs by mouth at bedtime as needed for mild constipation.   metoprolol tartrate 25 MG tablet Commonly known as: LOPRESSOR Take 25 mg by mouth at bedtime.   metoprolol tartrate 50 MG tablet Commonly known as: LOPRESSOR Take 50 mg by mouth daily. DAILY 07:00 AM - 10:00 AM   mirtazapine 7.5 MG tablet Commonly known as: REMERON Take 7.5 mg by mouth at bedtime.   nitroGLYCERIN 0.4 MG SL tablet Commonly known as: NITROSTAT Place 0.4 mg under the tongue every 5 (five) minutes as needed for chest pain.    NON FORMULARY APPLY VASELINE TO ARMS AND LEGS DAILY WITH AM CARE,CNA TO APPLY Once A Day   NON FORMULARY Incontinent Care: apply Vaseline to buttocks after each incontinent episode Every Shift   NON FORMULARY Moisturizing Cream to extremities with morning and evening care Every Shift   NON FORMULARY Special Instructions: Regular diet with thin liquids   omeprazole 10 MG capsule Commonly known as: PRILOSEC Take 10 mg by mouth daily.   Systane Nighttime Oint Apply to both eyes at bedtime      ROS was provided with assistance of staff.  Review of Systems  Constitutional: Negative for activity change, appetite change, fatigue and fever.  HENT: Positive for hearing loss. Negative for congestion and voice change.   Eyes: Negative for visual disturbance.       Chronic left upper lid stye.   Respiratory: Negative for cough and wheezing.   Cardiovascular: Negative for leg swelling.  Gastrointestinal: Negative for abdominal distention and constipation.  Genitourinary: Negative for difficulty urinating, dysuria and urgency.  Musculoskeletal: Positive for arthralgias, back pain and gait problem.  Skin: Negative for color change.       Skin lesion at top of her head.   Neurological: Negative for speech difficulty, weakness, light-headedness and headaches.       Dementia  Psychiatric/Behavioral: Positive for confusion. Negative for agitation and behavioral problems.    Immunization History  Administered Date(s) Administered  . Influenza Whole 03/27/2018  . Influenza, High Dose Seasonal PF 03/26/2019  . Influenza-Unspecified 04/14/2017  . Moderna SARS-COVID-2 Vaccination 07/24/2019, 08/21/2019  . Pneumococcal Conjugate-13 09/24/2017  . Pneumococcal Polysaccharide-23 06/14/2013  . Td 09/25/2017  . Varicella 06/29/2003   Pertinent  Health Maintenance Due  Topic Date Due  . INFLUENZA VACCINE  Completed  . DEXA SCAN  Completed  . PNA vac Low Risk Adult  Completed   Fall Risk   09/19/2017  Falls in the past year? No   Functional Status Survey:    Vitals:   09/16/19 1319  BP: 120/60  Pulse: 84  Temp: 98.3 F (36.8 C)  SpO2: 95%  Weight: 103 lb (46.7 kg)  Height: 5\' 1"  (1.549 m)   Body mass index is 19.46 kg/m. Physical Exam Vitals and nursing note reviewed.  Constitutional:      General: She  is not in acute distress.    Appearance: Normal appearance. She is not ill-appearing.  HENT:     Head: Normocephalic and atraumatic.     Mouth/Throat:     Mouth: Mucous membranes are moist.  Eyes:     Extraocular Movements: Extraocular movements intact.     Conjunctiva/sclera: Conjunctivae normal.     Pupils: Pupils are equal, round, and reactive to light.  Cardiovascular:     Rate and Rhythm: Normal rate and regular rhythm.     Heart sounds: No murmur.  Pulmonary:     Breath sounds: No rales.  Abdominal:     General: Bowel sounds are normal. There is no distension.     Palpations: Abdomen is soft.     Tenderness: There is no abdominal tenderness.  Musculoskeletal:     Cervical back: Normal range of motion and neck supple.     Right lower leg: No edema.     Left lower leg: No edema.  Skin:    General: Skin is warm and dry.     Comments: A dime sized abrasion appearance skin lesion, no s/s of active bleeding or infection, no pain or itching complained.   Neurological:     General: No focal deficit present.     Mental Status: She is alert. Mental status is at baseline.     Motor: No weakness.     Coordination: Coordination normal.     Gait: Gait abnormal.     Comments: Oriented to self.   Psychiatric:        Mood and Affect: Mood normal.        Behavior: Behavior normal.     Labs reviewed: Recent Labs    07/02/19 0000 07/05/19 0000 08/03/19 0000  NA 133* 134* 139  K 4.5 4.5 4.4  CL 100 103 108  CO2 26* 23* 26*  BUN 28* 11 14  CREATININE 0.7 0.6 0.6  CALCIUM 8.9 8.5* 8.4*   Recent Labs    04/15/19 0000 04/15/19 0000 06/22/19 0000  06/29/19 0000 08/03/19 0000  AST 26   < > 14 15 22   ALT 27   < > 10 18 19   ALKPHOS 95   < > 77 96 74  ALBUMIN 4.0  --   --  3.3* 2.8*   < > = values in this interval not displayed.   Recent Labs    06/29/19 0000 07/02/19 0000 08/03/19 0000  WBC 7.4 9.5 5.3  NEUTROABS  --  6,365 1,882  HGB 13.1 13.9 11.2*  HCT 38 41 33*  PLT 343 335 303   Lab Results  Component Value Date   TSH 2.99 11/13/2017   Lab Results  Component Value Date   HGBA1C 5.8 06/29/2013   No results found for: CHOL, HDL, LDLCALC, LDLDIRECT, TRIG, CHOLHDL  Significant Diagnostic Results in last 30 days:  No results found.  Assessment/Plan Skin lesion of scalp On top of the head, a dime sized skin lesion, no active bleeding or s/s of infection, will refer to dermatology if persists.   Depression, recurrent ([place]) Her mood is stable, continue Mirtazapine.   Tachycardia Heart rate is in control, continue Metoprolol.   Hypertension Blood pressure is controlled, continue Metoprolol.   Chronic bronchitis (HCC) Stable, continue Breo Ellipta  GERD (gastroesophageal reflux disease) Stable, continue Omeprazole.   Osteoarthritis Mild, multiple sites, continue Tylenol qhs.      Family/ staff Communication: plan of care reviewed with the patient and charge nurse.  Labs/tests ordered:  none  Time spend 25 minutes.

## 2019-09-16 NOTE — Assessment & Plan Note (Signed)
On top of the head, a dime sized skin lesion, no active bleeding or s/s of infection, will refer to dermatology if persists.

## 2019-09-16 NOTE — Assessment & Plan Note (Signed)
Stable, continue Omeprazole.  

## 2019-09-16 NOTE — Assessment & Plan Note (Signed)
Mild, multiple sites, continue Tylenol qhs.

## 2019-09-16 NOTE — Assessment & Plan Note (Signed)
Stable, continue Breo Ellipta 

## 2019-09-16 NOTE — Assessment & Plan Note (Signed)
Heart rate is in control, continue Metoprolol  

## 2019-09-16 NOTE — Assessment & Plan Note (Signed)
Her mood is stable, continue Mirtazapine.  °

## 2019-10-04 IMAGING — CR DG ELBOW COMPLETE 3+V*L*
4 series · 4 of 4 positions shown · non-contrast
Comparison: None.

CLINICAL DATA: Left elbow pain, swelling and bruising after falling
in the bathroom.

EXAM:
LEFT ELBOW - COMPLETE 3+ VIEW

[x elbow ap left]
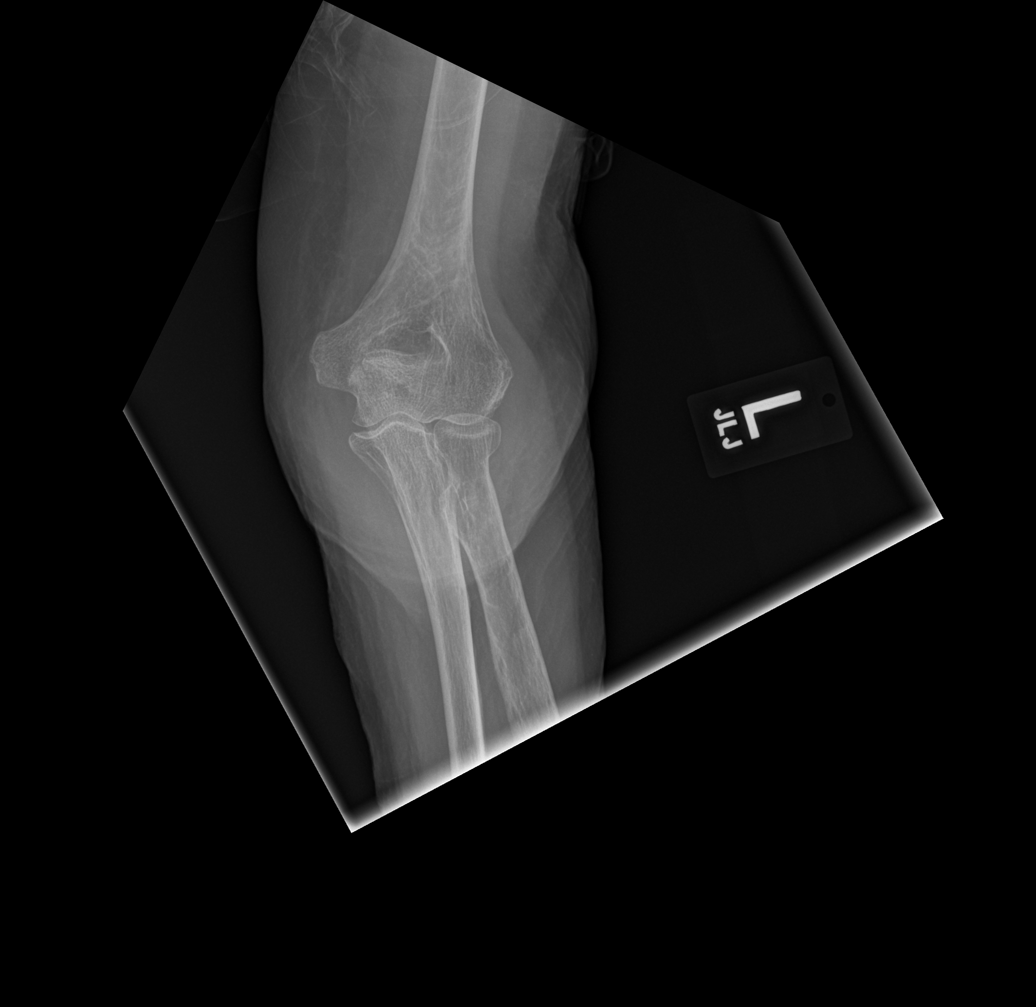

[x elbow obl left (1 of 2)]
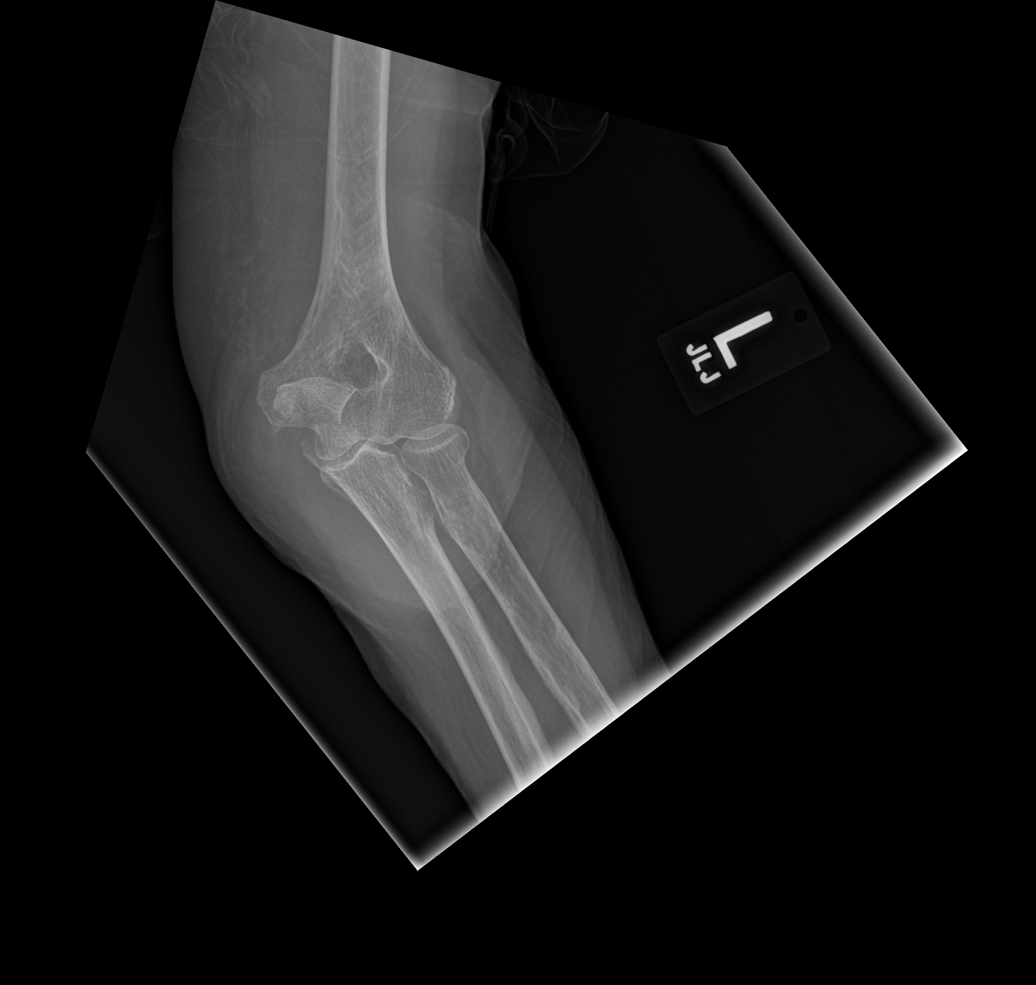

[x elbow obl left (2 of 2)]
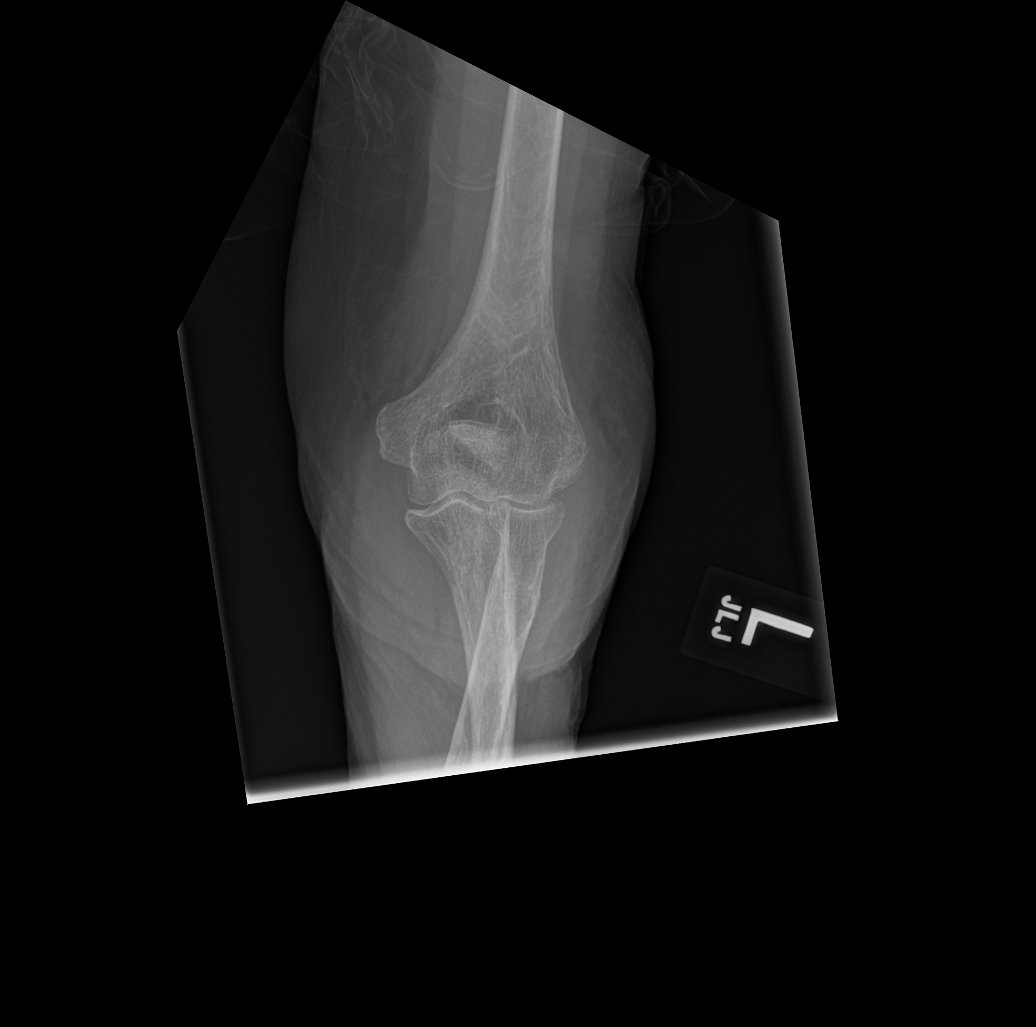

[x elbow lat left]
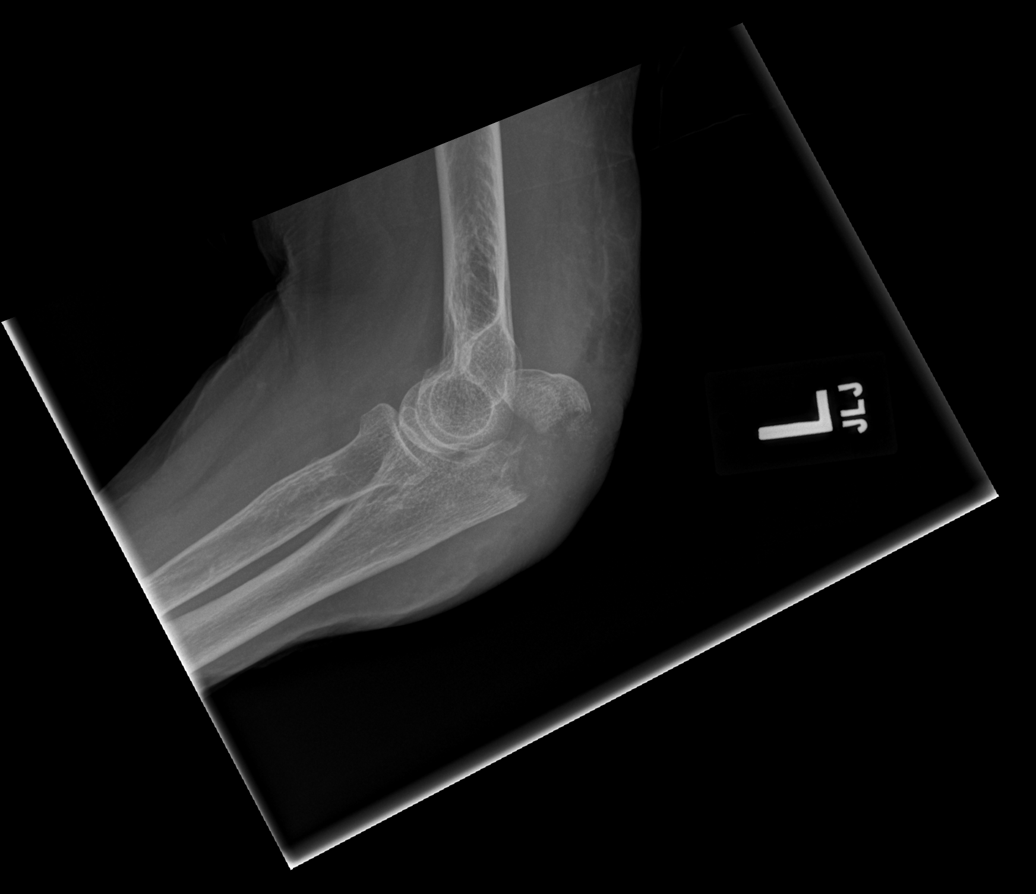

[4 of 4 positions shown; findings below may reference images not displayed]

FINDINGS: Fracture of the olecranon with retraction of the fracture fragment
by 1.5 cm. No fracture of the humerus or radius is seen. Obviously,
there is a joint effusion.
IMPRESSION: Retracted olecranon fracture.

## 2019-10-05 DIAGNOSIS — L814 Other melanin hyperpigmentation: Secondary | ICD-10-CM | POA: Diagnosis not present

## 2019-10-05 DIAGNOSIS — L821 Other seborrheic keratosis: Secondary | ICD-10-CM | POA: Diagnosis not present

## 2019-10-05 DIAGNOSIS — L570 Actinic keratosis: Secondary | ICD-10-CM | POA: Diagnosis not present

## 2019-10-05 DIAGNOSIS — D1801 Hemangioma of skin and subcutaneous tissue: Secondary | ICD-10-CM | POA: Diagnosis not present

## 2019-10-07 ENCOUNTER — Encounter: Payer: Self-pay | Admitting: Nurse Practitioner

## 2019-10-07 ENCOUNTER — Non-Acute Institutional Stay (SKILLED_NURSING_FACILITY): Payer: Medicare Other | Admitting: Nurse Practitioner

## 2019-10-07 DIAGNOSIS — J42 Unspecified chronic bronchitis: Secondary | ICD-10-CM

## 2019-10-07 DIAGNOSIS — F339 Major depressive disorder, recurrent, unspecified: Secondary | ICD-10-CM

## 2019-10-07 DIAGNOSIS — M1990 Unspecified osteoarthritis, unspecified site: Secondary | ICD-10-CM

## 2019-10-07 DIAGNOSIS — I10 Essential (primary) hypertension: Secondary | ICD-10-CM

## 2019-10-07 DIAGNOSIS — K219 Gastro-esophageal reflux disease without esophagitis: Secondary | ICD-10-CM

## 2019-10-07 NOTE — Progress Notes (Signed)
Location:   [facility] Room Number: 58 Place of Service:  SNF (31) Provider:  Duilio Heritage, NP   Patient Care Team: [name], MD as PCP - General (Internal Medicine) [name], MD as Referring Physician (Ophthalmology) Gaetan Spieker X, NP as Nurse Practitioner (Internal Medicine)  Extended Emergency Contact Information Primary Emergency Contact: [name] Address: [address]          [place], [place] [address] [name] of [place] Phone: [phone] Mobile Phone: [phone] Relation: Son Secondary Emergency Contact: [name] Address: [address]          [place], [place] [address] [name] of [place] Phone: [phone] Mobile Phone: [phone] Relation: Son  Code Status:  DNR Goals of care: Advanced Directive information Advanced Directives 10/07/2019  Does Patient Have a Medical Advance Directive? Yes  Type of Advance Directive Out of facility DNR (pink MOST or yellow form)  Does patient want to make changes to medical advance directive? No - Patient declined  Copy of [place] in Chart? -  Would patient like information on creating a medical advance directive? -  Pre-existing out of facility DNR order (yellow form or pink MOST form) Yellow form placed in chart (order not valid for inpatient use);Pink MOST form placed in chart (order not valid for inpatient use)     Chief Complaint  Patient presents with  . Medical Management of Chronic Issues    Routine Visit     HPI:  Pt is a 86 y.o. female seen today for medical management of chronic diseases.    The patient resides in SNF [facility] for safety, care assistance, w/c for mobility. Hx of GERD, stable, on Omeprazole 10mg  qd, her mood is stable, on MIrtazapine 7.5mg  qd. HTN, heart rate are controlled on Metoprolol. COPD, stable, on Breo Ellipta qd. OA, multiple sites aches/pains, stable, on Tylenol 500mg  qd.     Past Medical History:    Diagnosis Date  . Anemia, iron deficiency    takes Ferrous Sulfate daily  . Depression    takes Effexor daily  . Gait disorder 04/21/2014  . GERD (gastroesophageal reflux disease)    takes Omeprazole daily  . Herpes ocular    history of-takes Acyclovir daily  . High cholesterol    takes Atorvastatin daily  . History of hiatal hernia   . Hypertension    takes Metoprolol daily  . Insomnia    takes Xanax nightly  . Osteoarthritis of knee    bilateral  . Sciatic pain    Past Surgical History:  Procedure Laterality Date  . ABDOMINAL HYSTERECTOMY  1995   partial  . CARPAL TUNNEL RELEASE Right 07/21/2007  . CARPAL TUNNEL RELEASE Left 09/24/2007  . COLONOSCOPY    . DESCEMETS STRIPPING AUTOMATED ENDOTHELIAL KERATOPLASTY Left 03/14/2011  . DESCEMETS STRIPPING AUTOMATED ENDOTHELIAL KERATOPLASTY Right 08/20/2012  . EYE SURGERY Bilateral    cataract surgery  . EYE SURGERY Left    corneal transplant  . HERNIA REPAIR    . HIP ARTHROPLASTY Left 06/13/2013   Procedure: ARTHROPLASTY BIPOLAR HIP; Injection left shoulder;  Surgeon: Johnny Bridge, MD;  Location: [facility];  Service: Orthopedics;  Laterality: Left;  . JOINT REPLACEMENT     bilateral knees, left knee  . KNEE ARTHROSCOPY Right 05/11/2001  . KYPHOPLASTY N/A 04/10/2015   Procedure: T11 Kyphoplasty;  Surgeon: Jovita Gamma, MD;  Location: [facility];  Service: Neurosurgery;  Laterality: N/A;  T11 Kyphoplasty  . KYPHOPLASTY  N/A 05/06/2015   Procedure: KYPHOPLASTY Thoracic twelve;  Surgeon: Jovita Gamma, MD;  Location: [facility];  Service: Neurosurgery;  Laterality: N/A;  T12 Kyphoplasty  . LAPAROSCOPIC NISSEN FUNDOPLICATION  [id number]  . LUMBAR LAMINECTOMY/DECOMPRESSION MICRODISCECTOMY  08/29/2010   L2-S1  . SHOULDER ARTHROSCOPY WITH ROTATOR CUFF REPAIR AND SUBACROMIAL DECOMPRESSION Right 01/01/2000  . TONSILLECTOMY     as child  . TOTAL HIP REVISION Left 07/08/2014   Procedure: TOTAL HIP REVISION;  Surgeon: Kerin Salen,  MD;  Location: [facility];  Service: Orthopedics;  Laterality: Left;  . TOTAL KNEE ARTHROPLASTY Left 05/14/2004  . TOTAL KNEE ARTHROPLASTY  12/23/2011   Procedure: TOTAL KNEE ARTHROPLASTY;  Surgeon: Lorn Junes, MD;  Location: [facility];  Service: Orthopedics;  Laterality: Right;  DR [place] THIS CASE  . TRIGGER FINGER RELEASE Right 07/21/2007   thumb  . TRIGGER FINGER RELEASE Left 09/24/2007   middle finger  . TRIGGER FINGER RELEASE Right 03/10/2008   ring and little fingers  . TRIGGER FINGER RELEASE Right 09/02/2012   Procedure: RELEASE TRIGGER FINGER/A-1 PULLEY RIGHT INDEX FINGER;  Surgeon: Wynonia Sours, MD;  Location: [facility];  Service: Orthopedics;  Laterality: Right;  . TRIGGER FINGER RELEASE Left 12/22/2012   Procedure: RELEASE TRIGGER FINGER/A-1 PULLEY LEFT RING FINGER;  Surgeon: Wynonia Sours, MD;  Location: [facility];  Service: Orthopedics;  Laterality: Left;  Left     Allergies  Allergen Reactions  . Morphine And Related Other (See Comments)    AGITATION, STRANGE DREAMS  . Scopolamine Other (See Comments)    MENTAL CHANGES  . Atorvastatin Other (See Comments)    unknown  . Morphine Other (See Comments)    Disoriented, mood changes  . Nsaids Other (See Comments)    unknown  . Pravachol [Pravastatin Sodium] Other (See Comments)    unknown  . Pravastatin Other (See Comments)    unknown  . Teriparatide Other (See Comments)    unknown    Allergies as of 10/07/2019      Reactions   Morphine And Related Other (See Comments)   AGITATION, STRANGE DREAMS   Scopolamine Other (See Comments)   MENTAL CHANGES   Atorvastatin Other (See Comments)   unknown   Morphine Other (See Comments)   Disoriented, mood changes   Nsaids Other (See Comments)   unknown   Pravachol [pravastatin Sodium] Other (See Comments)   unknown   Pravastatin Other (See Comments)   unknown   Teriparatide Other (See Comments)   unknown      Medication List        Accurate as of October 07, 2019 11:59 PM. If you have any questions, ask your nurse or doctor.        STOP taking these medications   NON FORMULARY Stopped by: Myrick Mcnairy X Jahquan Klugh, NP   NON FORMULARY Stopped by: Blessing Zaucha X Jaqwon Manfred, NP     TAKE these medications   acetaminophen 500 MG tablet Commonly known as: TYLENOL Take 500 mg by mouth every 6 (six) hours as needed for mild pain or moderate pain. Also give 1 tablet  500 mg tab by mouth at bedtime   Non Aspirin NOT TO EXCEED 3,000 MG ACETAMINOPHEN IN 24 HRS (SCHEDULED + PRN DOSES).   acetaminophen 500 MG tablet Commonly known as: TYLENOL Take 500 mg by mouth at bedtime.   AQUAPHOR EX Apply 41 % topically as needed. Apply thin layer   Artificial Tears 0.2-0.2-1 %  Soln Generic drug: Glycerin-Hypromellose-PEG 400 Place 1 drop into both eyes 4 (four) times daily.   aspirin EC 81 MG tablet Take 81 mg by mouth daily.   Breo Ellipta 100-25 MCG/INH Aepb Generic drug: fluticasone furoate-vilanterol Inhale 1 puff into the lungs daily.   cholecalciferol 25 MCG (1000 UNIT) tablet Commonly known as: VITAMIN D3 Take 1,000 Units by mouth daily.   docusate sodium 100 MG capsule Commonly known as: COLACE Take 1 capsule by mouth daily as needed for mild constipation.   feeding supplement Liqd Commonly known as: BOOST / RESOURCE BREEZE Take 1 Container by mouth 3 (three) times daily. For weight loss   magnesium hydroxide 400 MG/5ML suspension Commonly known as: MILK OF MAGNESIA Take 15 mLs by mouth at bedtime as needed for mild constipation.   metoprolol tartrate 25 MG tablet Commonly known as: LOPRESSOR Take 25 mg by mouth at bedtime.   metoprolol tartrate 50 MG tablet Commonly known as: LOPRESSOR Take 50 mg by mouth daily. DAILY 07:00 AM - 10:00 AM   mirtazapine 7.5 MG tablet Commonly known as: REMERON Take 7.5 mg by mouth at bedtime.   nitroGLYCERIN 0.4 MG SL tablet Commonly known as: NITROSTAT Place 0.4 mg under the tongue  every 5 (five) minutes as needed for chest pain.   NON FORMULARY APPLY VASELINE TO ARMS AND LEGS DAILY WITH AM CARE,CNA TO APPLY Once A Day   NON FORMULARY Special Instructions: Regular diet with thin liquids   omeprazole 10 MG capsule Commonly known as: PRILOSEC Take 10 mg by mouth daily.   Systane Nighttime Oint Apply to both eyes at bedtime       Review of Systems  Constitutional: Negative for activity change, appetite change, fatigue, fever and unexpected weight change.  HENT: Positive for hearing loss. Negative for congestion and voice change.   Eyes: Positive for visual disturbance.       Chronic left upper lid stye.   Respiratory: Negative for cough and wheezing.   Cardiovascular: Negative for leg swelling.  Gastrointestinal: Negative for abdominal distention and constipation.  Genitourinary: Negative for difficulty urinating, dysuria and urgency.  Musculoskeletal: Positive for arthralgias, back pain and gait problem.  Skin: Negative for color change.  Neurological: Negative for dizziness, speech difficulty and weakness.       Dementia  Psychiatric/Behavioral: Positive for confusion. Negative for agitation and behavioral problems. The patient is not nervous/anxious.     Immunization History  Administered Date(s) Administered  . Influenza Whole 03/27/2018  . Influenza, High Dose Seasonal PF 03/26/2019  . Influenza-Unspecified 04/14/2017  . Moderna SARS-COVID-2 Vaccination 07/24/2019, 08/21/2019  . Pneumococcal Conjugate-13 09/24/2017  . Pneumococcal Polysaccharide-23 06/14/2013  . Td 09/25/2017  . Varicella 06/29/2003   Pertinent  Health Maintenance Due  Topic Date Due  . INFLUENZA VACCINE  01/23/2020  . DEXA SCAN  Completed  . PNA vac Low Risk Adult  Completed   Fall Risk  09/19/2017  Falls in the past year? No   Functional Status Survey:    Vitals:   10/07/19 1543  BP: 120/60  Pulse: 84  Resp: 17  Temp: (!) 97.2 F (36.2 C)  SpO2: 93%  Weight:  109 lb (49.4 kg)  Height: 5\' 1"  (1.549 m)   Body mass index is 20.6 kg/m. Physical Exam Vitals and nursing note reviewed.  Constitutional:      Appearance: Normal appearance.  HENT:     Head: Normocephalic and atraumatic.     Nose: Nose normal.     Mouth/Throat:  Mouth: Mucous membranes are moist.  Eyes:     Extraocular Movements: Extraocular movements intact.     Conjunctiva/sclera: Conjunctivae normal.     Pupils: Pupils are equal, round, and reactive to light.  Cardiovascular:     Rate and Rhythm: Normal rate and regular rhythm.     Heart sounds: No murmur.  Pulmonary:     Breath sounds: No rales.  Abdominal:     General: Bowel sounds are normal. There is no distension.     Palpations: Abdomen is soft.     Tenderness: There is no abdominal tenderness.  Musculoskeletal:     Cervical back: Normal range of motion and neck supple.     Right lower leg: No edema.     Left lower leg: No edema.  Skin:    General: Skin is warm and dry.  Neurological:     General: No focal deficit present.     Mental Status: She is alert. Mental status is at baseline.     Gait: Gait abnormal.     Comments: Oriented to self.   Psychiatric:        Mood and Affect: Mood normal.        Behavior: Behavior normal.     Labs reviewed: Recent Labs    07/02/19 0000 07/05/19 0000 08/03/19 0000  NA 133* 134* 139  K 4.5 4.5 4.4  CL 100 103 108  CO2 26* 23* 26*  BUN 28* 11 14  CREATININE 0.7 0.6 0.6  CALCIUM 8.9 8.5* 8.4*   Recent Labs    04/15/19 0000 04/15/19 0000 06/22/19 0000 06/29/19 0000 08/03/19 0000  AST 26   < > 14 15 22   ALT 27   < > 10 18 19   ALKPHOS 95   < > 77 96 74  ALBUMIN 4.0  --   --  3.3* 2.8*   < > = values in this interval not displayed.   Recent Labs    06/29/19 0000 07/02/19 0000 08/03/19 0000  WBC 7.4 9.5 5.3  NEUTROABS  --  6,365 1,882  HGB 13.1 13.9 11.2*  HCT 38 41 33*  PLT 343 335 303   Lab Results  Component Value Date   TSH 2.99 11/13/2017    Lab Results  Component Value Date   HGBA1C 5.8 06/29/2013   No results found for: CHOL, HDL, LDLCALC, LDLDIRECT, TRIG, CHOLHDL  Significant Diagnostic Results in last 30 days:  No results found.  Assessment/Plan GERD (gastroesophageal reflux disease) Stable, continue Omeprazole.   Depression, recurrent ([place]) Her mood, weight are stable, continue Mirtazapine.   Hypertension Blood pressure is controlled, continue Metoprolol   Chronic bronchitis (HCC) Stable, continue Breo Ellipta qd.   Osteoarthritis No c/o pain or aches, continue Tylenol.      Family/ staff Communication: plan of care reviewed with the patient and charge nurse.   Labs/tests ordered:  none  Time spend 25 minutes.

## 2019-10-08 ENCOUNTER — Encounter: Payer: Self-pay | Admitting: Nurse Practitioner

## 2019-10-08 NOTE — Assessment & Plan Note (Signed)
No c/o pain or aches, continue Tylenol.

## 2019-10-08 NOTE — Assessment & Plan Note (Signed)
Stable, continue Omeprazole.  

## 2019-10-08 NOTE — Assessment & Plan Note (Signed)
Her mood, weight are stable, continue Mirtazapine.

## 2019-10-08 NOTE — Assessment & Plan Note (Signed)
Blood pressure is controlled, continue Metoprolol. 

## 2019-10-08 NOTE — Assessment & Plan Note (Signed)
Stable, continue  Breo Ellipta qd  

## 2019-10-13 ENCOUNTER — Non-Acute Institutional Stay (SKILLED_NURSING_FACILITY): Payer: Medicare Other | Admitting: Nurse Practitioner

## 2019-10-13 ENCOUNTER — Encounter: Payer: Self-pay | Admitting: Nurse Practitioner

## 2019-10-13 DIAGNOSIS — R000 Tachycardia, unspecified: Secondary | ICD-10-CM

## 2019-10-13 DIAGNOSIS — F339 Major depressive disorder, recurrent, unspecified: Secondary | ICD-10-CM | POA: Diagnosis not present

## 2019-10-13 DIAGNOSIS — F0150 Vascular dementia without behavioral disturbance: Secondary | ICD-10-CM | POA: Diagnosis not present

## 2019-10-13 NOTE — Assessment & Plan Note (Signed)
Heart rate is in control, continue Metoprolol  

## 2019-10-13 NOTE — Assessment & Plan Note (Signed)
Her mood is stable, continue Mirtazapine.  °

## 2019-10-13 NOTE — Progress Notes (Signed)
Location:   [facility] Room Number: 73 Place of Service:  SNF (31) Provider: Marlana Latus, NP    Patient Care Team: [name], MD as PCP - General (Internal Medicine) [name], MD as Referring Physician (Ophthalmology) Taeya Theall X, NP as Nurse Practitioner (Internal Medicine)  Extended Emergency Contact Information Primary Emergency Contact: [name] Address: [address]          [place], [place] [address] [name] of [place] Phone: [phone] Mobile Phone: [phone] Relation: Son Secondary Emergency Contact: [name] Address: [address]          [place], [place] [address] [name] of [place] Phone: [phone] Mobile Phone: [phone] Relation: Son  Code Status: DNR Goals of care: Advanced Directive information Advanced Directives 10/13/2019  Does Patient Have a Medical Advance Directive? Yes  Type of Advance Directive Out of facility DNR (pink MOST or yellow form)  Does patient want to make changes to medical advance directive? No - Patient declined  Copy of [place] in Chart? -  Would patient like information on creating a medical advance directive? -  Pre-existing out of facility DNR order (yellow form or pink MOST form) Yellow form placed in chart (order not valid for inpatient use);Pink MOST form placed in chart (order not valid for inpatient use)     Chief Complaint  Patient presents with  . Acute Visit    Memory Issue    HPI:  Pt is a 86 y.o. female seen today for an acute visit for dementia, the patient's HPOA requested the patient be started on a memory preserving medication. Off Memantine 07/02/19 due to lethargy, COVID, dehydration. The patient's physical strength and ADL function have returned to her baseline prior COVID. The patient's mood is stable, on Mirtazapine 7.'5mg'$  qd. Heart rate is in control, on Metoprolol '50mg'$  qd, '25mg'$  qd.    Past Medical  History:  Diagnosis Date  . Anemia, iron deficiency    takes Ferrous Sulfate daily  . Depression    takes Effexor daily  . Gait disorder 04/21/2014  . GERD (gastroesophageal reflux disease)    takes Omeprazole daily  . Herpes ocular    history of-takes Acyclovir daily  . High cholesterol    takes Atorvastatin daily  . History of hiatal hernia   . Hypertension    takes Metoprolol daily  . Insomnia    takes Xanax nightly  . Osteoarthritis of knee    bilateral  . Sciatic pain    Past Surgical History:  Procedure Laterality Date  . ABDOMINAL HYSTERECTOMY  1995   partial  . CARPAL TUNNEL RELEASE Right 07/21/2007  . CARPAL TUNNEL RELEASE Left 09/24/2007  . COLONOSCOPY    . DESCEMETS STRIPPING AUTOMATED ENDOTHELIAL KERATOPLASTY Left 03/14/2011  . DESCEMETS STRIPPING AUTOMATED ENDOTHELIAL KERATOPLASTY Right 08/20/2012  . EYE SURGERY Bilateral    cataract surgery  . EYE SURGERY Left    corneal transplant  . HERNIA REPAIR    . HIP ARTHROPLASTY Left 06/13/2013   Procedure: ARTHROPLASTY BIPOLAR HIP; Injection left shoulder;  Surgeon: Johnny Bridge, MD;  Location: [facility];  Service: Orthopedics;  Laterality: Left;  . JOINT REPLACEMENT     bilateral knees, left knee  . KNEE ARTHROSCOPY Right 05/11/2001  . KYPHOPLASTY N/A 04/10/2015   Procedure: T11 Kyphoplasty;  Surgeon: Jovita Gamma, MD;  Location: [facility];  Service: Neurosurgery;  Laterality: N/A;  T11 Kyphoplasty  . KYPHOPLASTY N/A 05/06/2015   Procedure:  KYPHOPLASTY Thoracic twelve;  Surgeon: Jovita Gamma, MD;  Location: [facility];  Service: Neurosurgery;  Laterality: N/A;  T12 Kyphoplasty  . LAPAROSCOPIC NISSEN FUNDOPLICATION  3/61/4431  . LUMBAR LAMINECTOMY/DECOMPRESSION MICRODISCECTOMY  08/29/2010   L2-S1  . SHOULDER ARTHROSCOPY WITH ROTATOR CUFF REPAIR AND SUBACROMIAL DECOMPRESSION Right 01/01/2000  . TONSILLECTOMY     as child  . TOTAL HIP REVISION Left 07/08/2014   Procedure: TOTAL HIP REVISION;  Surgeon: Kerin Salen, MD;  Location: [facility];  Service: Orthopedics;  Laterality: Left;  . TOTAL KNEE ARTHROPLASTY Left 05/14/2004  . TOTAL KNEE ARTHROPLASTY  12/23/2011   Procedure: TOTAL KNEE ARTHROPLASTY;  Surgeon: Lorn Junes, MD;  Location: [facility];  Service: Orthopedics;  Laterality: Right;  DR [place] THIS CASE  . TRIGGER FINGER RELEASE Right 07/21/2007   thumb  . TRIGGER FINGER RELEASE Left 09/24/2007   middle finger  . TRIGGER FINGER RELEASE Right 03/10/2008   ring and little fingers  . TRIGGER FINGER RELEASE Right 09/02/2012   Procedure: RELEASE TRIGGER FINGER/A-1 PULLEY RIGHT INDEX FINGER;  Surgeon: Wynonia Sours, MD;  Location: [facility];  Service: Orthopedics;  Laterality: Right;  . TRIGGER FINGER RELEASE Left 12/22/2012   Procedure: RELEASE TRIGGER FINGER/A-1 PULLEY LEFT RING FINGER;  Surgeon: Wynonia Sours, MD;  Location: [facility];  Service: Orthopedics;  Laterality: Left;  Left     Allergies  Allergen Reactions  . Morphine And Related Other (See Comments)    AGITATION, STRANGE DREAMS  . Scopolamine Other (See Comments)    MENTAL CHANGES  . Atorvastatin Other (See Comments)    unknown  . Morphine Other (See Comments)    Disoriented, mood changes  . Nsaids Other (See Comments)    unknown  . Pravachol [Pravastatin Sodium] Other (See Comments)    unknown  . Pravastatin Other (See Comments)    unknown  . Teriparatide Other (See Comments)    unknown    Allergies as of 10/13/2019      Reactions   Morphine And Related Other (See Comments)   AGITATION, STRANGE DREAMS   Scopolamine Other (See Comments)   MENTAL CHANGES   Atorvastatin Other (See Comments)   unknown   Morphine Other (See Comments)   Disoriented, mood changes   Nsaids Other (See Comments)   unknown   Pravachol [pravastatin Sodium] Other (See Comments)   unknown   Pravastatin Other (See Comments)   unknown   Teriparatide Other (See Comments)   unknown        Medication List       Accurate as of October 13, 2019 11:59 PM. If you have any questions, ask your nurse or doctor.        STOP taking these medications   feeding supplement Liqd Commonly known as: BOOST / RESOURCE BREEZE Stopped by: Jaelynne Hockley X Chasten Blaze, NP   NON FORMULARY Stopped by: Ronnell Makarewicz X Teniqua Marron, NP     TAKE these medications   acetaminophen 500 MG tablet Commonly known as: TYLENOL Take 500 mg by mouth every 6 (six) hours as needed for mild pain or moderate pain. Also give 1 tablet  500 mg tab by mouth at bedtime   Non Aspirin NOT TO EXCEED 3,000 MG ACETAMINOPHEN IN 24 HRS (SCHEDULED + PRN DOSES).   acetaminophen 500 MG tablet Commonly known as: TYLENOL Take 500 mg by mouth at bedtime.   AQUAPHOR EX Apply 41 % topically as needed. Apply thin layer  Artificial Tears 0.2-0.2-1 % Soln Generic drug: Glycerin-Hypromellose-PEG 400 Place 1 drop into both eyes 4 (four) times daily.   aspirin EC 81 MG tablet Take 81 mg by mouth daily.   Breo Ellipta 100-25 MCG/INH Aepb Generic drug: fluticasone furoate-vilanterol Inhale 1 puff into the lungs daily.   cholecalciferol 25 MCG (1000 UNIT) tablet Commonly known as: VITAMIN D3 Take 1,000 Units by mouth daily.   docusate sodium 100 MG capsule Commonly known as: COLACE Take 1 capsule by mouth daily as needed for mild constipation.   feeding supplement (PRO-STAT SUGAR FREE 64) Liqd Take 30 mLs by mouth 3 (three) times daily with meals.   magnesium hydroxide 400 MG/5ML suspension Commonly known as: MILK OF MAGNESIA Take 15 mLs by mouth at bedtime as needed for mild constipation.   metoprolol tartrate 25 MG tablet Commonly known as: LOPRESSOR Take 25 mg by mouth at bedtime.   metoprolol tartrate 50 MG tablet Commonly known as: LOPRESSOR Take 50 mg by mouth daily. DAILY 07:00 AM - 10:00 AM   mirtazapine 7.5 MG tablet Commonly known as: REMERON Take 7.5 mg by mouth at bedtime.   nitroGLYCERIN 0.4 MG SL tablet Commonly known as:  NITROSTAT Place 0.4 mg under the tongue every 5 (five) minutes as needed for chest pain.   NON FORMULARY Special Instructions: Regular diet with thin liquids   omeprazole 10 MG capsule Commonly known as: PRILOSEC Take 10 mg by mouth daily.   Systane Nighttime Oint Apply to both eyes at bedtime       Review of Systems  Constitutional: Negative for activity change, appetite change, fatigue and fever.  HENT: Positive for hearing loss. Negative for congestion and voice change.   Eyes: Negative for visual disturbance.  Respiratory: Negative for cough and wheezing.   Cardiovascular: Negative for leg swelling.  Gastrointestinal: Negative for abdominal distention and constipation.  Genitourinary: Negative for difficulty urinating, dysuria and urgency.  Musculoskeletal: Positive for arthralgias and gait problem. Negative for back pain.  Skin: Negative for color change.  Neurological: Negative for dizziness, speech difficulty and weakness.       Dementia  Psychiatric/Behavioral: Positive for confusion. Negative for agitation, behavioral problems and sleep disturbance. The patient is not nervous/anxious.     Immunization History  Administered Date(s) Administered  . Influenza Whole 03/27/2018  . Influenza, High Dose Seasonal PF 03/26/2019  . Influenza-Unspecified 04/14/2017  . Moderna SARS-COVID-2 Vaccination 07/24/2019, 08/21/2019  . Pneumococcal Conjugate-13 09/24/2017  . Pneumococcal Polysaccharide-23 06/14/2013  . Td 09/25/2017  . Varicella 06/29/2003   Pertinent  Health Maintenance Due  Topic Date Due  . INFLUENZA VACCINE  01/23/2020  . DEXA SCAN  Completed  . PNA vac Low Risk Adult  Completed   Fall Risk  09/19/2017  Falls in the past year? No   Functional Status Survey:    Vitals:   10/13/19 1431  BP: 120/80  Pulse: 74  Resp: 17  Temp: 98 F (36.7 C)  SpO2: 95%  Weight: 115 lb (52.2 kg)  Height: '5\' 1"'$  (1.549 m)   Body mass index is 21.73 kg/m. Physical  Exam Vitals and nursing note reviewed.  Constitutional:      Appearance: Normal appearance.  HENT:     Head: Normocephalic and atraumatic.     Nose: Nose normal.     Mouth/Throat:     Mouth: Mucous membranes are moist.  Eyes:     Extraocular Movements: Extraocular movements intact.     Conjunctiva/sclera: Conjunctivae normal.  Pupils: Pupils are equal, round, and reactive to light.  Cardiovascular:     Rate and Rhythm: Normal rate and regular rhythm.     Heart sounds: No murmur.  Pulmonary:     Breath sounds: No rales.  Abdominal:     General: Bowel sounds are normal. There is no distension.     Palpations: Abdomen is soft.     Tenderness: There is no abdominal tenderness.  Musculoskeletal:     Cervical back: Normal range of motion and neck supple.     Right lower leg: No edema.     Left lower leg: No edema.  Skin:    General: Skin is warm and dry.  Neurological:     General: No focal deficit present.     Mental Status: She is alert. Mental status is at baseline.     Gait: Gait abnormal.     Comments: Oriented to self.   Psychiatric:        Mood and Affect: Mood normal.        Behavior: Behavior normal.     Labs reviewed: Recent Labs    07/02/19 0000 07/05/19 0000 08/03/19 0000  NA 133* 134* 139  K 4.5 4.5 4.4  CL 100 103 108  CO2 26* 23* 26*  BUN 28* 11 14  CREATININE 0.7 0.6 0.6  CALCIUM 8.9 8.5* 8.4*   Recent Labs    04/15/19 0000 04/15/19 0000 06/22/19 0000 06/29/19 0000 08/03/19 0000  AST 26   < > '14 15 22  '$ ALT 27   < > '10 18 19  '$ ALKPHOS 95   < > 77 96 74  ALBUMIN 4.0  --   --  3.3* 2.8*   < > = values in this interval not displayed.   Recent Labs    06/29/19 0000 07/02/19 0000 08/03/19 0000  WBC 7.4 9.5 5.3  NEUTROABS  --  6,365 1,882  HGB 13.1 13.9 11.2*  HCT 38 41 33*  PLT 343 335 303   Lab Results  Component Value Date   TSH 2.99 11/13/2017   Lab Results  Component Value Date   HGBA1C 5.8 06/29/2013   No results found  for: CHOL, HDL, LDLCALC, LDLDIRECT, TRIG, CHOLHDL  Significant Diagnostic Results in last 30 days:  No results found.  Assessment/Plan: Vascular dementia ([place]) Will resumed Memantine with starter pk. Will update CBC/diff, CMP/eGFR, TSH  Depression, recurrent (HCC) Her mood is stable, continue Mirtazapine.   Tachycardia Heart rate is in control, continue Metoprolol.     Family/ staff Communication: plan of care reviewed with the patient and charge nurse.   Labs/tests ordered: CBC/diff, CMP/eGFR, TSH  Time spend 25 minutes.

## 2019-10-13 NOTE — Assessment & Plan Note (Addendum)
Will resumed Memantine with starter pk. Will update CBC/diff, CMP/eGFR, TSH

## 2019-10-14 ENCOUNTER — Encounter: Payer: Self-pay | Admitting: Nurse Practitioner

## 2019-10-14 DIAGNOSIS — I10 Essential (primary) hypertension: Secondary | ICD-10-CM | POA: Diagnosis not present

## 2019-10-14 DIAGNOSIS — D509 Iron deficiency anemia, unspecified: Secondary | ICD-10-CM | POA: Diagnosis not present

## 2019-10-14 LAB — BASIC METABOLIC PANEL
BUN: 19 (ref 4–21)
CO2: 28 — AB (ref 13–22)
Chloride: 105 (ref 99–108)
Creatinine: 0.7 (ref 0.5–1.1)
Glucose: 90
Potassium: 4.9 (ref 3.4–5.3)
Sodium: 139 (ref 137–147)

## 2019-10-14 LAB — CBC AND DIFFERENTIAL
HCT: 37 (ref 36–46)
Hemoglobin: 11.9 — AB (ref 12.0–16.0)
Neutrophils Absolute: 2040
Platelets: 270 (ref 150–399)
WBC: 5

## 2019-10-14 LAB — HEPATIC FUNCTION PANEL
ALT: 23 (ref 7–35)
AST: 26 (ref 13–35)
Alkaline Phosphatase: 66 (ref 25–125)
Bilirubin, Total: 0.3

## 2019-10-14 LAB — COMPREHENSIVE METABOLIC PANEL
Albumin: 3.6 (ref 3.5–5.0)
Calcium: 9.2 (ref 8.7–10.7)
GFR calc Af Amer: 87
GFR calc non Af Amer: 75
Globulin: 2.7

## 2019-10-14 LAB — TSH: TSH: 2.68 (ref 0.41–5.90)

## 2019-10-14 LAB — CBC: RBC: 3.8 — AB (ref 3.87–5.11)

## 2019-10-15 DIAGNOSIS — H0221C Cicatricial lagophthalmos, bilateral, upper and lower eyelids: Secondary | ICD-10-CM | POA: Diagnosis not present

## 2019-10-15 DIAGNOSIS — H40003 Preglaucoma, unspecified, bilateral: Secondary | ICD-10-CM | POA: Diagnosis not present

## 2019-10-15 DIAGNOSIS — H04123 Dry eye syndrome of bilateral lacrimal glands: Secondary | ICD-10-CM | POA: Diagnosis not present

## 2019-10-15 DIAGNOSIS — H3530 Unspecified macular degeneration: Secondary | ICD-10-CM | POA: Diagnosis not present

## 2019-10-15 DIAGNOSIS — Z961 Presence of intraocular lens: Secondary | ICD-10-CM | POA: Diagnosis not present

## 2019-10-15 DIAGNOSIS — Z947 Corneal transplant status: Secondary | ICD-10-CM | POA: Diagnosis not present

## 2019-10-18 ENCOUNTER — Encounter: Payer: Self-pay | Admitting: Nurse Practitioner

## 2019-10-28 ENCOUNTER — Non-Acute Institutional Stay (SKILLED_NURSING_FACILITY): Payer: Medicare Other | Admitting: Internal Medicine

## 2019-10-28 ENCOUNTER — Encounter: Payer: Self-pay | Admitting: Internal Medicine

## 2019-10-28 DIAGNOSIS — F0150 Vascular dementia without behavioral disturbance: Secondary | ICD-10-CM | POA: Diagnosis not present

## 2019-10-28 DIAGNOSIS — I10 Essential (primary) hypertension: Secondary | ICD-10-CM

## 2019-10-28 DIAGNOSIS — F339 Major depressive disorder, recurrent, unspecified: Secondary | ICD-10-CM | POA: Diagnosis not present

## 2019-10-28 DIAGNOSIS — D509 Iron deficiency anemia, unspecified: Secondary | ICD-10-CM | POA: Diagnosis not present

## 2019-10-28 NOTE — Progress Notes (Signed)
Location:  [facility] Room Number: 42-B Place of Service:  SNF [phone]) Provider:  [name], MD  Patient Care Team: [name], MD as PCP - General (Internal Medicine) [name], MD as Referring Physician (Ophthalmology) Mast, Man X, NP as Nurse Practitioner (Internal Medicine)  Extended Emergency Contact Information Primary Emergency Contact: [name] Address: [address]          [place], [place] [address] [name] of [place] Phone: [phone] Mobile Phone: [phone] Relation: Son Secondary Emergency Contact: [name] Address: [address]          [place], [place] [address] [name] of [place] Phone: [phone] Mobile Phone: [phone] Relation: Son  Code Status:  DNR Goals of care: Advanced Directive information Advanced Directives 10/28/2019  Does Patient Have a Medical Advance Directive? Yes  Type of Advance Directive Out of facility DNR (pink MOST or yellow form)  Does patient want to make changes to medical advance directive? No - Patient declined  Copy of [place] in Chart? -  Would patient like information on creating a medical advance directive? -  Pre-existing out of facility DNR order (yellow form or pink MOST form) Pink MOST form placed in chart (order not valid for inpatient use)     Chief Complaint  Patient presents with  . Medical Management of Chronic Issues    Routine [facility] visit    HPI:  Pt is a 86 y.o. female seen today for medical management of chronic diseases.   Patient has a history ofHypertension, anemia, depression,Hyperlipidemia,COPD, hyponatremia,Depression with Anxiety and Dementia And Covid Pneumonia  Patient is a long-term resident of facility  Doing well. Is stable. Weight is stable. Appetite is good. Wheels around in her Wheelchair. Mood is stable. No New Nursing issues. Was started on Namenda few weeks  ago   Past Medical History:  Diagnosis Date  . Anemia, iron deficiency    takes Ferrous Sulfate daily  . Depression    takes Effexor daily  . Gait disorder 04/21/2014  . GERD (gastroesophageal reflux disease)    takes Omeprazole daily  . Herpes ocular    history of-takes Acyclovir daily  . High cholesterol    takes Atorvastatin daily  . History of hiatal hernia   . Hypertension    takes Metoprolol daily  . Insomnia    takes Xanax nightly  . Osteoarthritis of knee    bilateral  . Sciatic pain    Past Surgical History:  Procedure Laterality Date  . ABDOMINAL HYSTERECTOMY  1995   partial  . CARPAL TUNNEL RELEASE Right 07/21/2007  . CARPAL TUNNEL RELEASE Left 09/24/2007  . COLONOSCOPY    . DESCEMETS STRIPPING AUTOMATED ENDOTHELIAL KERATOPLASTY Left 03/14/2011  . DESCEMETS STRIPPING AUTOMATED ENDOTHELIAL KERATOPLASTY Right 08/20/2012  . EYE SURGERY Bilateral    cataract surgery  . EYE SURGERY Left    corneal transplant  . HERNIA REPAIR    . HIP ARTHROPLASTY Left 06/13/2013   Procedure: ARTHROPLASTY BIPOLAR HIP; Injection left shoulder;  Surgeon: Johnny Bridge, MD;  Location: [facility];  Service: Orthopedics;  Laterality: Left;  . JOINT REPLACEMENT     bilateral knees, left knee  . KNEE ARTHROSCOPY Right 05/11/2001  . KYPHOPLASTY N/A 04/10/2015   Procedure: T11 Kyphoplasty;  Surgeon: Jovita Gamma, MD;  Location: [facility];  Service: Neurosurgery;  Laterality: N/A;  T11 Kyphoplasty  . KYPHOPLASTY N/A 05/06/2015   Procedure: KYPHOPLASTY Thoracic twelve;  Surgeon:  Jovita Gamma, MD;  Location: [facility];  Service: Neurosurgery;  Laterality: N/A;  T12 Kyphoplasty  . LAPAROSCOPIC NISSEN FUNDOPLICATION  [id number]  . LUMBAR LAMINECTOMY/DECOMPRESSION MICRODISCECTOMY  08/29/2010   L2-S1  . SHOULDER ARTHROSCOPY WITH ROTATOR CUFF REPAIR AND SUBACROMIAL DECOMPRESSION Right 01/01/2000  . TONSILLECTOMY     as child  . TOTAL HIP REVISION Left 07/08/2014   Procedure: TOTAL HIP  REVISION;  Surgeon: Kerin Salen, MD;  Location: [facility];  Service: Orthopedics;  Laterality: Left;  . TOTAL KNEE ARTHROPLASTY Left 05/14/2004  . TOTAL KNEE ARTHROPLASTY  12/23/2011   Procedure: TOTAL KNEE ARTHROPLASTY;  Surgeon: Lorn Junes, MD;  Location: [facility];  Service: Orthopedics;  Laterality: Right;  DR [place] THIS CASE  . TRIGGER FINGER RELEASE Right 07/21/2007   thumb  . TRIGGER FINGER RELEASE Left 09/24/2007   middle finger  . TRIGGER FINGER RELEASE Right 03/10/2008   ring and little fingers  . TRIGGER FINGER RELEASE Right 09/02/2012   Procedure: RELEASE TRIGGER FINGER/A-1 PULLEY RIGHT INDEX FINGER;  Surgeon: Wynonia Sours, MD;  Location: [facility];  Service: Orthopedics;  Laterality: Right;  . TRIGGER FINGER RELEASE Left 12/22/2012   Procedure: RELEASE TRIGGER FINGER/A-1 PULLEY LEFT RING FINGER;  Surgeon: Wynonia Sours, MD;  Location: [facility];  Service: Orthopedics;  Laterality: Left;  Left     Allergies  Allergen Reactions  . Morphine And Related Other (See Comments)    AGITATION, STRANGE DREAMS  . Scopolamine Other (See Comments)    MENTAL CHANGES  . Atorvastatin Other (See Comments)    unknown  . Morphine Other (See Comments)    Disoriented, mood changes  . Nsaids Other (See Comments)    unknown  . Pravachol [Pravastatin Sodium] Other (See Comments)    unknown  . Pravastatin Other (See Comments)    unknown  . Teriparatide Other (See Comments)    unknown    Outpatient Encounter Medications as of 10/28/2019  Medication Sig  . acetaminophen (TYLENOL) 500 MG tablet Take 500 mg by mouth every 6 (six) hours as needed for mild pain or moderate pain. Also give 1 tablet  500 mg tab by mouth at bedtime   Non Aspirin NOT TO EXCEED 3,000 MG ACETAMINOPHEN IN 24 HRS (SCHEDULED + PRN DOSES).  Marland Kitchen acetaminophen (TYLENOL) 500 MG tablet Take 500 mg by mouth at bedtime.  . Amino Acids-Protein Hydrolys (FEEDING SUPPLEMENT, PRO-STAT SUGAR  FREE 64,) LIQD Take 30 mLs by mouth 3 (three) times daily with meals.  Marland Kitchen aspirin EC 81 MG tablet Take 81 mg by mouth daily.   . cholecalciferol (VITAMIN D3) 25 MCG (1000 UNIT) tablet Take 1,000 Units by mouth daily.   Marland Kitchen docusate sodium (COLACE) 100 MG capsule Take 1 capsule by mouth daily as needed for mild constipation.   . Emollient (AQUAPHOR EX) Apply 41 % topically daily as needed. Apply thin layer   . fluticasone furoate-vilanterol (BREO ELLIPTA) 100-25 MCG/INH AEPB Inhale 1 puff into the lungs daily.  . Glycerin-Hypromellose-PEG 400 (ARTIFICIAL TEARS) 0.2-0.2-1 % SOLN Place 1 drop into both eyes 4 (four) times daily.  . magnesium hydroxide (MILK OF MAGNESIA) 400 MG/5ML suspension Take 15 mLs by mouth at bedtime as needed for mild constipation.   Derrill Memo ON 10/29/2019] memantine (NAMENDA) 10 MG tablet Take 10 mg by mouth daily.  Derrill Memo ON 10/29/2019] memantine (NAMENDA) 5 MG tablet Take 5 mg by mouth every evening. 4PM  . memantine (NAMENDA)  5 MG tablet Take 5 mg by mouth 2 (two) times daily.  . metoprolol tartrate (LOPRESSOR) 25 MG tablet Take 25 mg by mouth at bedtime.   . metoprolol tartrate (LOPRESSOR) 50 MG tablet Take 50 mg by mouth daily. DAILY 07:00 AM - 10:00 AM  . mirtazapine (REMERON) 7.5 MG tablet Take 7.5 mg by mouth at bedtime.  . nitroGLYCERIN (NITROSTAT) 0.4 MG SL tablet Place 0.4 mg under the tongue every 5 (five) minutes as needed for chest pain.  . NON FORMULARY Special Instructions: Regular diet with thin liquids  . omeprazole (PRILOSEC) 10 MG capsule Take 10 mg by mouth daily.  Dema Severin Petrolatum-Mineral Oil (SYSTANE NIGHTTIME) OINT Apply to both eyes at bedtime   No facility-administered encounter medications on file as of 10/28/2019.    Review of Systems  Unable to perform ROS: Dementia    Immunization History  Administered Date(s) Administered  . Influenza Whole 03/27/2018  . Influenza, High Dose Seasonal PF 03/26/2019  . Influenza-Unspecified 04/14/2017  .  Moderna SARS-COVID-2 Vaccination 07/24/2019, 08/21/2019  . Pneumococcal Conjugate-13 09/24/2017  . Pneumococcal Polysaccharide-23 06/14/2013  . Td 09/25/2017  . Varicella 06/29/2003   Pertinent  Health Maintenance Due  Topic Date Due  . INFLUENZA VACCINE  01/23/2020  . DEXA SCAN  Completed  . PNA vac Low Risk Adult  Completed   Fall Risk  09/19/2017  Falls in the past year? No   Functional Status Survey:    Vitals:   10/28/19 1404  BP: 114/60  Pulse: 68  Resp: 20  Temp: (!) 97.3 F (36.3 C)  TempSrc: Oral  SpO2: 94%  Weight: 106 lb 9.6 oz (48.4 kg)  Height: 5\' 1"  (1.549 m)   Body mass index is 20.14 kg/m. Physical Exam Constitutional:  Well-developed and well-nourished.  HENT:  Head: Normocephalic.  Mouth/Throat: Oropharynx is clear and moist.  Eyes: Pupils are equal, round, and reactive to light.  Neck: Neck supple.  Cardiovascular: Normal rate and normal heart sounds.  No murmur heard. Pulmonary/Chest: Effort normal and breath sounds normal. No respiratory distress. No wheezes. She has no rales.  Abdominal: Soft. Bowel sounds are normal. No distension. There is no tenderness. There is no rebound.  Musculoskeletal: No edema.  Lymphadenopathy: none Neurological: Wheelchair Bound.  Skin: Skin is warm and dry.  Psychiatric: Normal mood and affect. Behavior is normal. Thought content normal.   Labs reviewed: Recent Labs    07/02/19 0000 07/05/19 0000 08/03/19 0000  NA 133* 134* 139  K 4.5 4.5 4.4  CL 100 103 108  CO2 26* 23* 26*  BUN 28* 11 14  CREATININE 0.7 0.6 0.6  CALCIUM 8.9 8.5* 8.4*   Recent Labs    04/15/19 0000 04/15/19 0000 06/22/19 0000 06/29/19 0000 08/03/19 0000  AST 26   < > 14 15 22   ALT 27   < > 10 18 19   ALKPHOS 95   < > 77 96 74  ALBUMIN 4.0  --   --  3.3* 2.8*   < > = values in this interval not displayed.   Recent Labs    06/29/19 0000 07/02/19 0000 08/03/19 0000  WBC 7.4 9.5 5.3  NEUTROABS  --  6,365 1,882  HGB 13.1  13.9 11.2*  HCT 38 41 33*  PLT 343 335 303   Lab Results  Component Value Date   TSH 2.99 11/13/2017   Lab Results  Component Value Date   HGBA1C 5.8 06/29/2013   No results found for: CHOL,  HDL, LDLCALC, LDLDIRECT, TRIG, CHOLHDL  Significant Diagnostic Results in last 30 days:  No results found.  Assessment/Plan Essential hypertensionwith Tachycardia Controlled on Lopressor  GERD On Prilosec  Gait disorder Wheelchair Bound No falls recently  Iron deficiency anemia, Off iron right now due to Poor Appetite would need to restart if Hgb drops more Dementia Namenda just started back Supportive care Depression On Remeron Was on Effexor and ativan before was discontinued  H/o COPD On Breo     Family/ staff Communication:   Labs/tests ordered:    Total time spent in this patient care encounter was  25_  minutes; greater than 50% of the visit spent counseling patient and staff, reviewing records , Labs and coordinating care for problems addressed at this encounter.

## 2019-11-02 DIAGNOSIS — L570 Actinic keratosis: Secondary | ICD-10-CM | POA: Diagnosis not present

## 2019-11-02 DIAGNOSIS — D1801 Hemangioma of skin and subcutaneous tissue: Secondary | ICD-10-CM | POA: Diagnosis not present

## 2019-11-02 DIAGNOSIS — L814 Other melanin hyperpigmentation: Secondary | ICD-10-CM | POA: Diagnosis not present

## 2019-11-02 DIAGNOSIS — L821 Other seborrheic keratosis: Secondary | ICD-10-CM | POA: Diagnosis not present

## 2019-11-24 ENCOUNTER — Encounter: Payer: Self-pay | Admitting: Nurse Practitioner

## 2019-11-24 DIAGNOSIS — M5416 Radiculopathy, lumbar region: Secondary | ICD-10-CM

## 2019-11-24 DIAGNOSIS — I10 Essential (primary) hypertension: Secondary | ICD-10-CM

## 2019-11-24 DIAGNOSIS — F0150 Vascular dementia without behavioral disturbance: Secondary | ICD-10-CM

## 2019-11-24 DIAGNOSIS — K219 Gastro-esophageal reflux disease without esophagitis: Secondary | ICD-10-CM

## 2019-11-24 DIAGNOSIS — D509 Iron deficiency anemia, unspecified: Secondary | ICD-10-CM

## 2019-11-24 DIAGNOSIS — R000 Tachycardia, unspecified: Secondary | ICD-10-CM

## 2019-11-24 DIAGNOSIS — F339 Major depressive disorder, recurrent, unspecified: Secondary | ICD-10-CM

## 2019-11-24 NOTE — Assessment & Plan Note (Signed)
Stable, uses w/c for mobility, continue Tylenol 500mg  qhs.

## 2019-11-24 NOTE — Assessment & Plan Note (Signed)
Her mood is stable, continue Mirtazapine.  °

## 2019-11-24 NOTE — Progress Notes (Signed)
Location:  [place] Room Number: 1 Place of Service:  SNF (31) Provider:  Samir Ishaq Darlina Rumpf, NP  Patient Care Team: [name], MD as PCP - General (Internal Medicine) [name], MD as Referring Physician (Ophthalmology) Giannah Zavadil X, NP as Nurse Practitioner (Internal Medicine)  Extended Emergency Contact Information Primary Emergency Contact: [name] Address: [address]          [place], [place] [address] [name] of [place] Phone: [phone] Mobile Phone: [phone] Relation: Son Secondary Emergency Contact: [name] Address: [address]          [place], [place] [address] [name] of [place] Phone: [phone] Mobile Phone: [phone] Relation: Son  Code Status:  DNR Goals of care: Advanced Directive information Advanced Directives 10/28/2019  Does Patient Have a Medical Advance Directive? Yes  Type of Advance Directive Out of facility DNR (pink MOST or yellow form)  Does patient want to make changes to medical advance directive? No - Patient declined  Copy of [place] in Chart? -  Would patient like information on creating a medical advance directive? -  Pre-existing out of facility DNR order (yellow form or pink MOST form) Pink MOST form placed in chart (order not valid for inpatient use)     Chief Complaint  Patient presents with  . Medical Management of Chronic Issues    Routine [facility] visit    HPI:  Pt is an 86 y.o. female seen today for medical management of chronic diseases.      Past Medical History:  Diagnosis Date  . Anemia, iron deficiency    takes Ferrous Sulfate daily  . Depression    takes Effexor daily  . Gait disorder 04/21/2014  . GERD (gastroesophageal reflux disease)    takes Omeprazole daily  . Herpes ocular    history of-takes Acyclovir daily  . High cholesterol    takes Atorvastatin daily  . History of hiatal hernia     . Hypertension    takes Metoprolol daily  . Insomnia    takes Xanax nightly  . Osteoarthritis of knee    bilateral  . Sciatic pain    Past Surgical History:  Procedure Laterality Date  . ABDOMINAL HYSTERECTOMY  1995   partial  . CARPAL TUNNEL RELEASE Right 07/21/2007  . CARPAL TUNNEL RELEASE Left 09/24/2007  . COLONOSCOPY    . DESCEMETS STRIPPING AUTOMATED ENDOTHELIAL KERATOPLASTY Left 03/14/2011  . DESCEMETS STRIPPING AUTOMATED ENDOTHELIAL KERATOPLASTY Right 08/20/2012  . EYE SURGERY Bilateral    cataract surgery  . EYE SURGERY Left    corneal transplant  . HERNIA REPAIR    . HIP ARTHROPLASTY Left 06/13/2013   Procedure: ARTHROPLASTY BIPOLAR HIP; Injection left shoulder;  Surgeon: Johnny Bridge, MD;  Location: [facility];  Service: Orthopedics;  Laterality: Left;  . JOINT REPLACEMENT     bilateral knees, left knee  . KNEE ARTHROSCOPY Right 05/11/2001  . KYPHOPLASTY N/A 04/10/2015   Procedure: T11 Kyphoplasty;  Surgeon: Jovita Gamma, MD;  Location: [facility];  Service: Neurosurgery;  Laterality: N/A;  T11 Kyphoplasty  . KYPHOPLASTY N/A 05/06/2015   Procedure: KYPHOPLASTY Thoracic twelve;  Surgeon: Jovita Gamma, MD;  Location: [facility];  Service: Neurosurgery;  Laterality: N/A;  T12 Kyphoplasty  . LAPAROSCOPIC NISSEN FUNDOPLICATION  [id number]  . LUMBAR LAMINECTOMY/DECOMPRESSION MICRODISCECTOMY  08/29/2010   L2-S1  . SHOULDER ARTHROSCOPY WITH ROTATOR CUFF REPAIR AND SUBACROMIAL DECOMPRESSION Right 01/01/2000  . TONSILLECTOMY  as child  . TOTAL HIP REVISION Left 07/08/2014   Procedure: TOTAL HIP REVISION;  Surgeon: Kerin Salen, MD;  Location: [facility];  Service: Orthopedics;  Laterality: Left;  . TOTAL KNEE ARTHROPLASTY Left 05/14/2004  . TOTAL KNEE ARTHROPLASTY  12/23/2011   Procedure: TOTAL KNEE ARTHROPLASTY;  Surgeon: Lorn Junes, MD;  Location: [facility];  Service: Orthopedics;  Laterality: Right;  DR [place] THIS CASE  . TRIGGER FINGER RELEASE Right  07/21/2007   thumb  . TRIGGER FINGER RELEASE Left 09/24/2007   middle finger  . TRIGGER FINGER RELEASE Right 03/10/2008   ring and little fingers  . TRIGGER FINGER RELEASE Right 09/02/2012   Procedure: RELEASE TRIGGER FINGER/A-1 PULLEY RIGHT INDEX FINGER;  Surgeon: Wynonia Sours, MD;  Location: [facility];  Service: Orthopedics;  Laterality: Right;  . TRIGGER FINGER RELEASE Left 12/22/2012   Procedure: RELEASE TRIGGER FINGER/A-1 PULLEY LEFT RING FINGER;  Surgeon: Wynonia Sours, MD;  Location: [facility];  Service: Orthopedics;  Laterality: Left;  Left     Allergies  Allergen Reactions  . Morphine And Related Other (See Comments)    AGITATION, STRANGE DREAMS  . Scopolamine Other (See Comments)    MENTAL CHANGES  . Atorvastatin Other (See Comments)    unknown  . Morphine Other (See Comments)    Disoriented, mood changes  . Nsaids Other (See Comments)    unknown  . Pravachol [Pravastatin Sodium] Other (See Comments)    unknown  . Pravastatin Other (See Comments)    unknown  . Teriparatide Other (See Comments)    unknown    Outpatient Encounter Medications as of 11/24/2019  Medication Sig  . acetaminophen (TYLENOL) 500 MG tablet Take 500 mg by mouth every 6 (six) hours as needed for mild pain or moderate pain. Also give 1 tablet  500 mg tab by mouth at bedtime   Non Aspirin NOT TO EXCEED 3,000 MG ACETAMINOPHEN IN 24 HRS (SCHEDULED + PRN DOSES).  Marland Kitchen acetaminophen (TYLENOL) 500 MG tablet Take 500 mg by mouth at bedtime.  . Amino Acids-Protein Hydrolys (FEEDING SUPPLEMENT, PRO-STAT SUGAR FREE 64,) LIQD Take 30 mLs by mouth 3 (three) times daily with meals.  Marland Kitchen aspirin EC 81 MG tablet Take 81 mg by mouth daily.   . cholecalciferol (VITAMIN D3) 25 MCG (1000 UNIT) tablet Take 1,000 Units by mouth daily.   Marland Kitchen docusate sodium (COLACE) 100 MG capsule Take 1 capsule by mouth daily as needed for mild constipation.   . Emollient (AQUAPHOR EX) Apply 41 % topically daily as  needed. Apply thin layer   . fluticasone furoate-vilanterol (BREO ELLIPTA) 100-25 MCG/INH AEPB Inhale 1 puff into the lungs daily.  . Glycerin-Hypromellose-PEG 400 (ARTIFICIAL TEARS) 0.2-0.2-1 % SOLN Place 1 drop into both eyes 4 (four) times daily.  . magnesium hydroxide (MILK OF MAGNESIA) 400 MG/5ML suspension Take 15 mLs by mouth at bedtime as needed for mild constipation.   . memantine (NAMENDA) 10 MG tablet Take 10 mg by mouth daily.  . metoprolol tartrate (LOPRESSOR) 25 MG tablet Take 25 mg by mouth at bedtime.   . metoprolol tartrate (LOPRESSOR) 50 MG tablet Take 50 mg by mouth daily. DAILY 07:00 AM - 10:00 AM  . mirtazapine (REMERON) 7.5 MG tablet Take 7.5 mg by mouth at bedtime.  . nitroGLYCERIN (NITROSTAT) 0.4 MG SL tablet Place 0.4 mg under the tongue every 5 (five) minutes as needed for chest pain.  . NON FORMULARY Special Instructions:  Regular diet with thin liquids  . omeprazole (PRILOSEC) 10 MG capsule Take 10 mg by mouth daily.  Dema Severin Petrolatum-Mineral Oil (SYSTANE NIGHTTIME) OINT Apply to both eyes at bedtime   No facility-administered encounter medications on file as of 11/24/2019.    Review of Systems  Immunization History  Administered Date(s) Administered  . Influenza Whole 03/27/2018  . Influenza, High Dose Seasonal PF 03/26/2019  . Influenza-Unspecified 04/14/2017  . Moderna SARS-COVID-2 Vaccination 07/24/2019, 08/21/2019  . Pneumococcal Conjugate-13 09/24/2017  . Pneumococcal Polysaccharide-23 06/14/2013  . Td 09/25/2017  . Varicella 06/29/2003   Pertinent  Health Maintenance Due  Topic Date Due  . INFLUENZA VACCINE  01/23/2020  . DEXA SCAN  Completed  . PNA vac Low Risk Adult  Completed   Fall Risk  09/19/2017  Falls in the past year? No   Functional Status Survey:    Vitals:   11/24/19 1543  BP: 126/68  Pulse: 62  Resp: 15  Temp: 97.6 F (36.4 C)  TempSrc: Oral  SpO2: 94%  Weight: 108 lb 11.2 oz (49.3 kg)  Height: 5\' 1"  (1.549 m)   Body  mass index is 20.54 kg/m. Physical Exam  Labs reviewed: Recent Labs    07/05/19 0000 08/03/19 0000 10/14/19 0000  NA 134* 139 139  K 4.5 4.4 4.9  CL 103 108 105  CO2 23* 26* 28*  BUN 11 14 19   CREATININE 0.6 0.6 0.7  CALCIUM 8.5* 8.4* 9.2   Recent Labs    06/29/19 0000 08/03/19 0000 10/14/19 0000  AST 15 22 26   ALT 18 19 23   ALKPHOS 96 74 66  ALBUMIN 3.3* 2.8* 3.6   Recent Labs    07/02/19 0000 08/03/19 0000 10/14/19 0000  WBC 9.5 5.3 5.0  NEUTROABS 6,365 1,882 2,040  HGB 13.9 11.2* 11.9*  HCT 41 33* 37  PLT 335 303 270   Lab Results  Component Value Date   TSH 2.68 10/14/2019   Lab Results  Component Value Date   HGBA1C 5.8 06/29/2013   No results found for: CHOL, HDL, LDLCALC, LDLDIRECT, TRIG, CHOLHDL  Significant Diagnostic Results in last 30 days:  No results found.  Assessment/Plan There are no diagnoses linked to this encounter.   Family/ staff Communication:   Labs/tests ordered:      This encounter was created in error - please disregard.

## 2019-11-24 NOTE — Assessment & Plan Note (Signed)
Functioning well in [facility], self propels w/c for mobility, continue Memantine for memory.

## 2019-11-24 NOTE — Assessment & Plan Note (Signed)
Heart rate is in control, continue Metoprolol  

## 2019-11-24 NOTE — Progress Notes (Signed)
Location:   SNF [place] Room Number: 43 Place of Service:  SNF (31) Provider: [facility] Wajiha Versteeg NP  [name], MD  Patient Care Team: [name], MD as PCP - General (Internal Medicine) [name], MD as Referring Physician (Ophthalmology) Depaul Arizpe X, NP as Nurse Practitioner (Internal Medicine)  Extended Emergency Contact Information Primary Emergency Contact: [name] Address: [address]          [place], [place] [address] [name] of [place] Phone: [phone] Mobile Phone: [phone] Relation: Son Secondary Emergency Contact: [name] Address: [address]          [place], [place] [address] [name] of [place] Phone: [phone] Mobile Phone: [phone] Relation: Son  Code Status:  DNR Goals of care: Advanced Directive information Advanced Directives 10/28/2019  Does Patient Have a Medical Advance Directive? Yes  Type of Advance Directive Out of facility DNR (pink MOST or yellow form)  Does patient want to make changes to medical advance directive? No - Patient declined  Copy of [place] in Chart? -  Would patient like information on creating a medical advance directive? -  Pre-existing out of facility DNR order (yellow form or pink MOST form) Pink MOST form placed in chart (order not valid for inpatient use)     Chief Complaint  Patient presents with  . Medical Management of Chronic Issues    Routine [facility] visit    HPI:  Pt is a 86 y.o. female seen today for medical management of chronic diseases.    The patient self propels w/c to get around in SNF [facility], on Memantine 10mg  qd for memory, her mood is stable, on Mirtazapine 7.5mg  qd. Hx of GERD, stable, on Omeprazole 10mg  qd. Heart rate is in control, on Metoprolol. Chronic lower back pain, stable, on Tylenol 500mg  qd   Past Medical History:  Diagnosis Date  . Anemia, iron deficiency    takes Ferrous Sulfate  daily  . Depression    takes Effexor daily  . Gait disorder 04/21/2014  . GERD (gastroesophageal reflux disease)    takes Omeprazole daily  . Herpes ocular    history of-takes Acyclovir daily  . High cholesterol    takes Atorvastatin daily  . History of hiatal hernia   . Hypertension    takes Metoprolol daily  . Insomnia    takes Xanax nightly  . Osteoarthritis of knee    bilateral  . Sciatic pain    Past Surgical History:  Procedure Laterality Date  . ABDOMINAL HYSTERECTOMY  1995   partial  . CARPAL TUNNEL RELEASE Right 07/21/2007  . CARPAL TUNNEL RELEASE Left 09/24/2007  . COLONOSCOPY    . DESCEMETS STRIPPING AUTOMATED ENDOTHELIAL KERATOPLASTY Left 03/14/2011  . DESCEMETS STRIPPING AUTOMATED ENDOTHELIAL KERATOPLASTY Right 08/20/2012  . EYE SURGERY Bilateral    cataract surgery  . EYE SURGERY Left    corneal transplant  . HERNIA REPAIR    . HIP ARTHROPLASTY Left 06/13/2013   Procedure: ARTHROPLASTY BIPOLAR HIP; Injection left shoulder;  Surgeon: Johnny Bridge, MD;  Location: [facility];  Service: Orthopedics;  Laterality: Left;  . JOINT REPLACEMENT     bilateral knees, left knee  . KNEE ARTHROSCOPY Right 05/11/2001  . KYPHOPLASTY N/A 04/10/2015   Procedure: T11 Kyphoplasty;  Surgeon: Jovita Gamma, MD;  Location: [facility];  Service: Neurosurgery;  Laterality: N/A;  T11 Kyphoplasty  . KYPHOPLASTY N/A 05/06/2015   Procedure: KYPHOPLASTY Thoracic twelve;  Surgeon:  Jovita Gamma, MD;  Location: [facility];  Service: Neurosurgery;  Laterality: N/A;  T12 Kyphoplasty  . LAPAROSCOPIC NISSEN FUNDOPLICATION  [id number]  . LUMBAR LAMINECTOMY/DECOMPRESSION MICRODISCECTOMY  08/29/2010   L2-S1  . SHOULDER ARTHROSCOPY WITH ROTATOR CUFF REPAIR AND SUBACROMIAL DECOMPRESSION Right 01/01/2000  . TONSILLECTOMY     as child  . TOTAL HIP REVISION Left 07/08/2014   Procedure: TOTAL HIP REVISION;  Surgeon: Kerin Salen, MD;  Location: [facility];  Service: Orthopedics;  Laterality: Left;  . TOTAL  KNEE ARTHROPLASTY Left 05/14/2004  . TOTAL KNEE ARTHROPLASTY  12/23/2011   Procedure: TOTAL KNEE ARTHROPLASTY;  Surgeon: Lorn Junes, MD;  Location: [facility];  Service: Orthopedics;  Laterality: Right;  DR [place] THIS CASE  . TRIGGER FINGER RELEASE Right 07/21/2007   thumb  . TRIGGER FINGER RELEASE Left 09/24/2007   middle finger  . TRIGGER FINGER RELEASE Right 03/10/2008   ring and little fingers  . TRIGGER FINGER RELEASE Right 09/02/2012   Procedure: RELEASE TRIGGER FINGER/A-1 PULLEY RIGHT INDEX FINGER;  Surgeon: Wynonia Sours, MD;  Location: [facility];  Service: Orthopedics;  Laterality: Right;  . TRIGGER FINGER RELEASE Left 12/22/2012   Procedure: RELEASE TRIGGER FINGER/A-1 PULLEY LEFT RING FINGER;  Surgeon: Wynonia Sours, MD;  Location: [facility];  Service: Orthopedics;  Laterality: Left;  Left     Allergies  Allergen Reactions  . Morphine And Related Other (See Comments)    AGITATION, STRANGE DREAMS  . Scopolamine Other (See Comments)    MENTAL CHANGES  . Atorvastatin Other (See Comments)    unknown  . Morphine Other (See Comments)    Disoriented, mood changes  . Nsaids Other (See Comments)    unknown  . Pravachol [Pravastatin Sodium] Other (See Comments)    unknown  . Pravastatin Other (See Comments)    unknown  . Teriparatide Other (See Comments)    unknown    Allergies as of 11/24/2019      Reactions   Morphine And Related Other (See Comments)   AGITATION, STRANGE DREAMS   Scopolamine Other (See Comments)   MENTAL CHANGES   Atorvastatin Other (See Comments)   unknown   Morphine Other (See Comments)   Disoriented, mood changes   Nsaids Other (See Comments)   unknown   Pravachol [pravastatin Sodium] Other (See Comments)   unknown   Pravastatin Other (See Comments)   unknown   Teriparatide Other (See Comments)   unknown      Medication List       Accurate as of November 24, 2019 11:59 PM. If you have any questions,  ask your nurse or doctor.        acetaminophen 500 MG tablet Commonly known as: TYLENOL Take 500 mg by mouth every 6 (six) hours as needed for mild pain or moderate pain. Also give 1 tablet  500 mg tab by mouth at bedtime   Non Aspirin NOT TO EXCEED 3,000 MG ACETAMINOPHEN IN 24 HRS (SCHEDULED + PRN DOSES).   acetaminophen 500 MG tablet Commonly known as: TYLENOL Take 500 mg by mouth at bedtime.   AQUAPHOR EX Apply 41 % topically daily as needed. Apply thin layer   Artificial Tears 0.2-0.2-1 % Soln Generic drug: Glycerin-Hypromellose-PEG 400 Place 1 drop into both eyes 4 (four) times daily.   aspirin EC 81 MG tablet Take 81 mg by mouth daily.   Breo Ellipta 100-25 MCG/INH Aepb Generic drug: fluticasone furoate-vilanterol Inhale 1 puff  into the lungs daily.   cholecalciferol 25 MCG (1000 UNIT) tablet Commonly known as: VITAMIN D3 Take 1,000 Units by mouth daily.   docusate sodium 100 MG capsule Commonly known as: COLACE Take 1 capsule by mouth daily as needed for mild constipation.   feeding supplement (PRO-STAT SUGAR FREE 64) Liqd Take 30 mLs by mouth 3 (three) times daily with meals.   magnesium hydroxide 400 MG/5ML suspension Commonly known as: MILK OF MAGNESIA Take 15 mLs by mouth at bedtime as needed for mild constipation.   memantine 10 MG tablet Commonly known as: NAMENDA Take 10 mg by mouth daily.   metoprolol tartrate 25 MG tablet Commonly known as: LOPRESSOR Take 25 mg by mouth at bedtime.   metoprolol tartrate 50 MG tablet Commonly known as: LOPRESSOR Take 50 mg by mouth daily. DAILY 07:00 AM - 10:00 AM   mirtazapine 7.5 MG tablet Commonly known as: REMERON Take 7.5 mg by mouth at bedtime.   nitroGLYCERIN 0.4 MG SL tablet Commonly known as: NITROSTAT Place 0.4 mg under the tongue every 5 (five) minutes as needed for chest pain.   NON FORMULARY Special Instructions: Regular diet with thin liquids   omeprazole 10 MG capsule Commonly known as:  PRILOSEC Take 10 mg by mouth daily.   Systane Nighttime Oint Apply to both eyes at bedtime       Review of Systems  Constitutional: Negative for fatigue, fever and unexpected weight change.  HENT: Positive for hearing loss. Negative for congestion and voice change.   Eyes: Negative for visual disturbance.  Respiratory: Negative for cough and wheezing.   Cardiovascular: Negative for leg swelling.  Gastrointestinal: Negative for abdominal distention and constipation.  Genitourinary: Negative for difficulty urinating, dysuria and urgency.  Musculoskeletal: Positive for arthralgias, back pain and gait problem.  Skin: Negative for color change.  Neurological: Negative for speech difficulty, weakness, light-headedness and headaches.       Dementia  Psychiatric/Behavioral: Positive for confusion. Negative for behavioral problems and sleep disturbance. The patient is not nervous/anxious.     Immunization History  Administered Date(s) Administered  . Influenza Whole 03/27/2018  . Influenza, High Dose Seasonal PF 03/26/2019  . Influenza-Unspecified 04/14/2017  . Moderna SARS-COVID-2 Vaccination 07/24/2019, 08/21/2019  . Pneumococcal Conjugate-13 09/24/2017  . Pneumococcal Polysaccharide-23 06/14/2013  . Td 09/25/2017  . Varicella 06/29/2003   Pertinent  Health Maintenance Due  Topic Date Due  . INFLUENZA VACCINE  01/23/2020  . DEXA SCAN  Completed  . PNA vac Low Risk Adult  Completed   Fall Risk  09/19/2017  Falls in the past year? No   Functional Status Survey:    Vitals:   11/24/19 1543  BP: 126/68  Pulse: 62  Resp: 15  Temp: 97.6 F (36.4 C)  TempSrc: Oral  SpO2: 94%  Weight: 108 lb 11.2 oz (49.3 kg)  Height: 5\' 1"  (1.549 m)   Body mass index is 20.54 kg/m. Physical Exam Vitals and nursing note reviewed.  Constitutional:      Appearance: Normal appearance.  HENT:     Head: Normocephalic and atraumatic.     Mouth/Throat:     Mouth: Mucous membranes are  moist.  Eyes:     Extraocular Movements: Extraocular movements intact.     Conjunctiva/sclera: Conjunctivae normal.     Pupils: Pupils are equal, round, and reactive to light.  Cardiovascular:     Rate and Rhythm: Normal rate and regular rhythm.     Heart sounds: No murmur.  Pulmonary:  Breath sounds: No rales.  Abdominal:     General: Bowel sounds are normal.     Palpations: Abdomen is soft.     Tenderness: There is no abdominal tenderness.  Musculoskeletal:     Cervical back: Normal range of motion and neck supple.     Right lower leg: No edema.     Left lower leg: No edema.  Skin:    General: Skin is warm and dry.  Neurological:     General: No focal deficit present.     Mental Status: She is alert. Mental status is at baseline.     Gait: Gait abnormal.     Comments: Oriented to self.   Psychiatric:        Mood and Affect: Mood normal.        Behavior: Behavior normal.     Labs reviewed: Recent Labs    07/05/19 0000 08/03/19 0000 10/14/19 0000  NA 134* 139 139  K 4.5 4.4 4.9  CL 103 108 105  CO2 23* 26* 28*  BUN 11 14 19   CREATININE 0.6 0.6 0.7  CALCIUM 8.5* 8.4* 9.2   Recent Labs    06/29/19 0000 08/03/19 0000 10/14/19 0000  AST 15 22 26   ALT 18 19 23   ALKPHOS 96 74 66  ALBUMIN 3.3* 2.8* 3.6   Recent Labs    07/02/19 0000 08/03/19 0000 10/14/19 0000  WBC 9.5 5.3 5.0  NEUTROABS 6,365 1,882 2,040  HGB 13.9 11.2* 11.9*  HCT 41 33* 37  PLT 335 303 270   Lab Results  Component Value Date   TSH 2.68 10/14/2019   Lab Results  Component Value Date   HGBA1C 5.8 06/29/2013   No results found for: CHOL, HDL, LDLCALC, LDLDIRECT, TRIG, CHOLHDL  Significant Diagnostic Results in last 30 days:  No results found.  Assessment/Plan  Hypertension Blood pressure is controlled, continue Metoprolol.   GERD (gastroesophageal reflux disease) Stable, continue Famotidine.   Lumbar radiculopathy, chronic Stable, uses w/c for mobility, continue  Tylenol 500mg  qhs.   Vascular dementia [facility]) Functioning well in SN[facility], self propels w/c for mobility, continue Memantine for memory.   Depression, recurrent ([place]) Her mood is stable, continue Mirtazapine.   Tachycardia Heart rate is in control, continue Metoprolol.   Iron deficiency anemia 10/14/19 Hgb 11.9, off Iron, update CBC   Family/ staff Communication: plan of care reviewed with the patient and charge nurse.   Labs/tests ordered:  CBC  Time spend 25 minutes.

## 2019-11-24 NOTE — Assessment & Plan Note (Signed)
10/14/19 Hgb 11.9, off Iron, update CBC

## 2019-11-24 NOTE — Assessment & Plan Note (Signed)
Blood pressure is controlled, continue Metoprolol. 

## 2019-11-24 NOTE — Assessment & Plan Note (Signed)
Stable, continue Famotidine.  

## 2019-11-25 ENCOUNTER — Encounter: Payer: Self-pay | Admitting: Nurse Practitioner

## 2019-11-25 LAB — CBC AND DIFFERENTIAL
HCT: 35 — AB (ref 36–46)
Hemoglobin: 11.6 — AB (ref 12.0–16.0)
Neutrophils Absolute: 1545
Platelets: 228 (ref 150–399)
WBC: 5

## 2019-11-25 LAB — CBC: RBC: 3.66 — AB (ref 3.87–5.11)

## 2019-12-02 ENCOUNTER — Non-Acute Institutional Stay (SKILLED_NURSING_FACILITY): Payer: Medicare Other | Admitting: Nurse Practitioner

## 2019-12-02 ENCOUNTER — Encounter: Payer: Self-pay | Admitting: Nurse Practitioner

## 2019-12-02 DIAGNOSIS — D509 Iron deficiency anemia, unspecified: Secondary | ICD-10-CM | POA: Diagnosis not present

## 2019-12-02 DIAGNOSIS — M1990 Unspecified osteoarthritis, unspecified site: Secondary | ICD-10-CM | POA: Diagnosis not present

## 2019-12-02 DIAGNOSIS — I10 Essential (primary) hypertension: Secondary | ICD-10-CM

## 2019-12-02 DIAGNOSIS — K219 Gastro-esophageal reflux disease without esophagitis: Secondary | ICD-10-CM

## 2019-12-02 DIAGNOSIS — F0150 Vascular dementia without behavioral disturbance: Secondary | ICD-10-CM | POA: Diagnosis not present

## 2019-12-02 DIAGNOSIS — F339 Major depressive disorder, recurrent, unspecified: Secondary | ICD-10-CM

## 2019-12-02 DIAGNOSIS — R000 Tachycardia, unspecified: Secondary | ICD-10-CM

## 2019-12-02 NOTE — Assessment & Plan Note (Signed)
Heart rate is controlled, continue Metoprolol.

## 2019-12-02 NOTE — Assessment & Plan Note (Signed)
Stable, continue Omeprazole.  

## 2019-12-02 NOTE — Assessment & Plan Note (Signed)
Blood pressure is controlled, continue Metoprolol. 

## 2019-12-02 NOTE — Assessment & Plan Note (Signed)
Her mood/weight are stable, continue MIrtazapine.

## 2019-12-02 NOTE — Assessment & Plan Note (Signed)
Stable, lower back pain at night, continue Tylenol 500mg  nightly

## 2019-12-02 NOTE — Assessment & Plan Note (Signed)
Tolerated Memantine well, continue SNF [facility] for safety, care assistance.

## 2019-12-02 NOTE — Progress Notes (Signed)
Location:   [facility], Civil Service of Service:  SNF (31)SNF [place] #42B Provider: Marlana Latus, NP    Patient Care Team: [name], MD as PCP - General (Internal Medicine) [name], MD as Referring Physician (Ophthalmology) Melquisedec Journey X, NP as Nurse Practitioner (Internal Medicine)  Extended Emergency Contact Information Primary Emergency Contact: [name] Address: [address]          [place], [place] [address] [name] of [place] Phone: [phone] Mobile Phone: [phone] Relation: Son Secondary Emergency Contact: [name] Address: [address]          [place], [place] [address] [name] of [place] Phone: [phone] Mobile Phone: [phone] Relation: Son  Code Status: DNR Goals of care: Advanced Directive information Advanced Directives 10/28/2019  Does Patient Have a Medical Advance Directive? Yes  Type of Advance Directive Out of facility DNR (pink MOST or yellow form)  Does patient want to make changes to medical advance directive? No - Patient declined  Copy of [place] in Chart? -  Would patient like information on creating a medical advance directive? -  Pre-existing out of facility DNR order (yellow form or pink MOST form) Pink MOST form placed in chart (order not valid for inpatient use)     Chief Complaint  Patient presents with  . Medical Management of Chronic Issues    HPI:  Pt is a 86 y.o. female seen today for manage chronic medical issues.    The patient's mood is stable, on Mirtazapine 7.5mg  qd. Heart rate is in control, on Metoprolol 50mg  qd, 25mg  qd. OA pain is controlled, on Tylenol 500mg  qhs. Her memory is preserved on Mamantine 10mg  qd. GERD, stable, on Omeprazole 10mg  qd.    Past Medical History:  Diagnosis Date  . Anemia, iron deficiency    takes Ferrous Sulfate daily  . Depression    takes Effexor daily  . Gait disorder 04/21/2014  . GERD (gastroesophageal  reflux disease)    takes Omeprazole daily  . Herpes ocular    history of-takes Acyclovir daily  . High cholesterol    takes Atorvastatin daily  . History of hiatal hernia   . Hypertension    takes Metoprolol daily  . Insomnia    takes Xanax nightly  . Osteoarthritis of knee    bilateral  . Sciatic pain    Past Surgical History:  Procedure Laterality Date  . ABDOMINAL HYSTERECTOMY  1995   partial  . CARPAL TUNNEL RELEASE Right 07/21/2007  . CARPAL TUNNEL RELEASE Left 09/24/2007  . COLONOSCOPY    . DESCEMETS STRIPPING AUTOMATED ENDOTHELIAL KERATOPLASTY Left 03/14/2011  . DESCEMETS STRIPPING AUTOMATED ENDOTHELIAL KERATOPLASTY Right 08/20/2012  . EYE SURGERY Bilateral    cataract surgery  . EYE SURGERY Left    corneal transplant  . HERNIA REPAIR    . HIP ARTHROPLASTY Left 06/13/2013   Procedure: ARTHROPLASTY BIPOLAR HIP; Injection left shoulder;  Surgeon: Johnny Bridge, MD;  Location: [facility];  Service: Orthopedics;  Laterality: Left;  . JOINT REPLACEMENT     bilateral knees, left knee  . KNEE ARTHROSCOPY Right 05/11/2001  . KYPHOPLASTY N/A 04/10/2015   Procedure: T11 Kyphoplasty;  Surgeon: Jovita Gamma, MD;  Location: [facility];  Service: Neurosurgery;  Laterality: N/A;  T11 Kyphoplasty  . KYPHOPLASTY N/A 05/06/2015   Procedure: KYPHOPLASTY Thoracic twelve;  Surgeon: Jovita Gamma, MD;  Location: [facility];  Service: Neurosurgery;  Laterality: N/A;  T12 Kyphoplasty  .  LAPAROSCOPIC NISSEN FUNDOPLICATION  3/32/9518  . LUMBAR LAMINECTOMY/DECOMPRESSION MICRODISCECTOMY  08/29/2010   L2-S1  . SHOULDER ARTHROSCOPY WITH ROTATOR CUFF REPAIR AND SUBACROMIAL DECOMPRESSION Right 01/01/2000  . TONSILLECTOMY     as child  . TOTAL HIP REVISION Left 07/08/2014   Procedure: TOTAL HIP REVISION;  Surgeon: Kerin Salen, MD;  Location: [facility];  Service: Orthopedics;  Laterality: Left;  . TOTAL KNEE ARTHROPLASTY Left 05/14/2004  . TOTAL KNEE ARTHROPLASTY  12/23/2011   Procedure: TOTAL KNEE  ARTHROPLASTY;  Surgeon: Lorn Junes, MD;  Location: [facility];  Service: Orthopedics;  Laterality: Right;  DR [place] THIS CASE  . TRIGGER FINGER RELEASE Right 07/21/2007   thumb  . TRIGGER FINGER RELEASE Left 09/24/2007   middle finger  . TRIGGER FINGER RELEASE Right 03/10/2008   ring and little fingers  . TRIGGER FINGER RELEASE Right 09/02/2012   Procedure: RELEASE TRIGGER FINGER/A-1 PULLEY RIGHT INDEX FINGER;  Surgeon: Wynonia Sours, MD;  Location: [facility];  Service: Orthopedics;  Laterality: Right;  . TRIGGER FINGER RELEASE Left 12/22/2012   Procedure: RELEASE TRIGGER FINGER/A-1 PULLEY LEFT RING FINGER;  Surgeon: Wynonia Sours, MD;  Location: [facility];  Service: Orthopedics;  Laterality: Left;  Left     Allergies  Allergen Reactions  . Morphine And Related Other (See Comments)    AGITATION, STRANGE DREAMS  . Scopolamine Other (See Comments)    MENTAL CHANGES  . Atorvastatin Other (See Comments)    unknown  . Morphine Other (See Comments)    Disoriented, mood changes  . Nsaids Other (See Comments)    unknown  . Pravachol [Pravastatin Sodium] Other (See Comments)    unknown  . Pravastatin Other (See Comments)    unknown  . Teriparatide Other (See Comments)    unknown    Allergies as of 12/02/2019      Reactions   Morphine And Related Other (See Comments)   AGITATION, STRANGE DREAMS   Scopolamine Other (See Comments)   MENTAL CHANGES   Atorvastatin Other (See Comments)   unknown   Morphine Other (See Comments)   Disoriented, mood changes   Nsaids Other (See Comments)   unknown   Pravachol [pravastatin Sodium] Other (See Comments)   unknown   Pravastatin Other (See Comments)   unknown   Teriparatide Other (See Comments)   unknown      Medication List       Accurate as of December 02, 2019  2:07 PM. If you have any questions, ask your nurse or doctor.        acetaminophen 500 MG tablet Commonly known as:  TYLENOL Take 500 mg by mouth every 6 (six) hours as needed for mild pain or moderate pain. Also give 1 tablet  500 mg tab by mouth at bedtime   Non Aspirin NOT TO EXCEED 3,000 MG ACETAMINOPHEN IN 24 HRS (SCHEDULED + PRN DOSES).   acetaminophen 500 MG tablet Commonly known as: TYLENOL Take 500 mg by mouth at bedtime.   AQUAPHOR EX Apply 41 % topically daily as needed. Apply thin layer   Artificial Tears 0.2-0.2-1 % Soln Generic drug: Glycerin-Hypromellose-PEG 400 Place 1 drop into both eyes 4 (four) times daily.   aspirin EC 81 MG tablet Take 81 mg by mouth daily.   Breo Ellipta 100-25 MCG/INH Aepb Generic drug: fluticasone furoate-vilanterol Inhale 1 puff into the lungs daily.   cholecalciferol 25 MCG (1000 UNIT) tablet Commonly known as: VITAMIN D3 Take  1,000 Units by mouth daily.   docusate sodium 100 MG capsule Commonly known as: COLACE Take 1 capsule by mouth daily as needed for mild constipation.   feeding supplement (PRO-STAT SUGAR FREE 64) Liqd Take 30 mLs by mouth 3 (three) times daily with meals.   magnesium hydroxide 400 MG/5ML suspension Commonly known as: MILK OF MAGNESIA Take 15 mLs by mouth at bedtime as needed for mild constipation.   memantine 10 MG tablet Commonly known as: NAMENDA Take 10 mg by mouth daily.   metoprolol tartrate 25 MG tablet Commonly known as: LOPRESSOR Take 25 mg by mouth at bedtime.   metoprolol tartrate 50 MG tablet Commonly known as: LOPRESSOR Take 50 mg by mouth daily. DAILY 07:00 AM - 10:00 AM   mirtazapine 7.5 MG tablet Commonly known as: REMERON Take 7.5 mg by mouth at bedtime.   nitroGLYCERIN 0.4 MG SL tablet Commonly known as: NITROSTAT Place 0.4 mg under the tongue every 5 (five) minutes as needed for chest pain.   NON FORMULARY Special Instructions: Regular diet with thin liquids   omeprazole 10 MG capsule Commonly known as: PRILOSEC Take 10 mg by mouth daily.   Systane Nighttime Oint Apply to both eyes at  bedtime       Review of Systems  Constitutional: Negative for fatigue, fever and unexpected weight change.  HENT: Positive for hearing loss. Negative for congestion and voice change.   Eyes: Negative for visual disturbance.  Respiratory: Negative for cough and wheezing.   Cardiovascular: Negative for leg swelling.  Gastrointestinal: Negative for abdominal distention and constipation.  Genitourinary: Negative for difficulty urinating, dysuria and urgency.  Musculoskeletal: Positive for arthralgias, back pain and gait problem.  Skin: Negative for color change.  Neurological: Negative for speech difficulty, weakness, light-headedness and headaches.       Dementia  Psychiatric/Behavioral: Positive for confusion. Negative for behavioral problems and sleep disturbance. The patient is not nervous/anxious.     Immunization History  Administered Date(s) Administered  . Influenza Whole 03/27/2018  . Influenza, High Dose Seasonal PF 03/26/2019  . Influenza-Unspecified 04/14/2017  . Moderna SARS-COVID-2 Vaccination 07/24/2019, 08/21/2019  . Pneumococcal Conjugate-13 09/24/2017  . Pneumococcal Polysaccharide-23 06/14/2013  . Td 09/25/2017  . Varicella 06/29/2003   Pertinent  Health Maintenance Due  Topic Date Due  . INFLUENZA VACCINE  01/23/2020  . DEXA SCAN  Completed  . PNA vac Low Risk Adult  Completed   Fall Risk  09/19/2017  Falls in the past year? No   Functional Status Survey:    Vitals:   12/02/19 1356  BP: 122/64  Pulse: 65  Resp: 15  Temp: 97.6 F (36.4 C)   There is no height or weight on file to calculate BMI. Physical Exam Vitals and nursing note reviewed.  Constitutional:      Appearance: Normal appearance.  HENT:     Head: Normocephalic and atraumatic.     Mouth/Throat:     Mouth: Mucous membranes are moist.  Eyes:     Extraocular Movements: Extraocular movements intact.     Conjunctiva/sclera: Conjunctivae normal.     Pupils: Pupils are equal, round,  and reactive to light.  Cardiovascular:     Rate and Rhythm: Normal rate and regular rhythm.     Heart sounds: No murmur heard.   Pulmonary:     Breath sounds: No rales.  Abdominal:     General: Bowel sounds are normal.     Palpations: Abdomen is soft.     Tenderness: There  is no abdominal tenderness.  Musculoskeletal:     Cervical back: Normal range of motion and neck supple.     Right lower leg: No edema.     Left lower leg: No edema.  Skin:    General: Skin is warm and dry.  Neurological:     General: No focal deficit present.     Mental Status: She is alert. Mental status is at baseline.     Gait: Gait abnormal.     Comments: Oriented to self.   Psychiatric:        Mood and Affect: Mood normal.        Behavior: Behavior normal.     Labs reviewed: Recent Labs    07/05/19 0000 08/03/19 0000 10/14/19 0000  NA 134* 139 139  K 4.5 4.4 4.9  CL 103 108 105  CO2 23* 26* 28*  BUN 11 14 19   CREATININE 0.6 0.6 0.7  CALCIUM 8.5* 8.4* 9.2   Recent Labs    06/29/19 0000 08/03/19 0000 10/14/19 0000  AST 15 22 26   ALT 18 19 23   ALKPHOS 96 74 66  ALBUMIN 3.3* 2.8* 3.6   Recent Labs    07/02/19 0000 08/03/19 0000 10/14/19 0000  WBC 9.5 5.3 5.0  NEUTROABS 6,365 1,882 2,040  HGB 13.9 11.2* 11.9*  HCT 41 33* 37  PLT 335 303 270   Lab Results  Component Value Date   TSH 2.68 10/14/2019   Lab Results  Component Value Date   HGBA1C 5.8 06/29/2013   No results found for: CHOL, HDL, LDLCALC, LDLDIRECT, TRIG, CHOLHDL  Significant Diagnostic Results in last 30 days:  No results found.  Assessment/Plan: Hypertension Blood pressure is controlled, continue Metoprolol.   GERD (gastroesophageal reflux disease) Stable, continue Omeprazole.   Vascular dementia ([place]) Tolerated Memantine well, continue SNF [facility] for safety, care assistance.   Osteoarthritis Stable, lower back pain at night, continue Tylenol 500mg  nightly  Depression, recurrent (HCC) Her  mood/weight are stable, continue MIrtazapine.   Iron deficiency anemia Off Fe 10/14/19, Hgb is stable, Hgb 11.9 10/14/19, then 11.6 11/25/19  Tachycardia Heart rate is controlled, continue Metoprolol.     Family/ staff Communication: plan of care reviewed with the patient and charge nurse.   Labs/tests ordered: CBC done 11/25/19  Time spend 25 minutes.

## 2019-12-02 NOTE — Assessment & Plan Note (Signed)
Off Fe 10/14/19, Hgb is stable, Hgb 11.9 10/14/19, then 11.6 11/25/19

## 2019-12-29 ENCOUNTER — Encounter: Payer: Self-pay | Admitting: Nurse Practitioner

## 2019-12-29 ENCOUNTER — Non-Acute Institutional Stay (SKILLED_NURSING_FACILITY): Payer: Medicare Other | Admitting: Nurse Practitioner

## 2019-12-29 DIAGNOSIS — J42 Unspecified chronic bronchitis: Secondary | ICD-10-CM | POA: Diagnosis not present

## 2019-12-29 DIAGNOSIS — R000 Tachycardia, unspecified: Secondary | ICD-10-CM

## 2019-12-29 DIAGNOSIS — K219 Gastro-esophageal reflux disease without esophagitis: Secondary | ICD-10-CM | POA: Diagnosis not present

## 2019-12-29 DIAGNOSIS — F0150 Vascular dementia without behavioral disturbance: Secondary | ICD-10-CM

## 2019-12-29 DIAGNOSIS — M1990 Unspecified osteoarthritis, unspecified site: Secondary | ICD-10-CM | POA: Diagnosis not present

## 2019-12-29 DIAGNOSIS — F339 Major depressive disorder, recurrent, unspecified: Secondary | ICD-10-CM

## 2019-12-29 NOTE — Assessment & Plan Note (Signed)
Stable, continue  Breo Ellipta qd  

## 2019-12-29 NOTE — Progress Notes (Signed)
Location:   SNF [place] Room Number: 42B Place of Service:  SNF (31) Provider: [facility] Yancarlos Berthold NP  [name], MD  Patient Care Team: [name], MD as PCP - General (Internal Medicine) [name], MD as Referring Physician (Ophthalmology) Dustina Scoggin X, NP as Nurse Practitioner (Internal Medicine)  Extended Emergency Contact Information Primary Emergency Contact: [name] Address: [address]          [place], [place] [address] [name] of [place] Phone: [phone] Mobile Phone: [phone] Relation: Son Secondary Emergency Contact: [name] Address: [address]          [place], [place] [address] [name] of [place] Phone: [phone] Mobile Phone: [phone] Relation: Son  Code Status:  DNR Goals of care: Advanced Directive information Advanced Directives 10/28/2019  Does Patient Have a Medical Advance Directive? Yes  Type of Advance Directive Out of facility DNR (pink MOST or yellow form)  Does patient want to make changes to medical advance directive? No - Patient declined  Copy of [place] in Chart? -  Would patient like information on creating a medical advance directive? -  Pre-existing out of facility DNR order (yellow form or pink MOST form) Pink MOST form placed in chart (order not valid for inpatient use)     Chief Complaint  Patient presents with  . Medical Management of Chronic Issues    HPI:  Pt is a 86 y.o. female seen today for medical management of chronic diseases.    The patient's mood is stable, on Mirtazapine 7.5mg  qd.   Heart rate is in control, on Metoprolol 50mg  qd, 25mg  qd.    OA pain is controlled, on Tylenol 500mg  qhs and prn.   Her memory is preserved on Mamantine 10mg  qd.    GERD, stable, on Omeprazole 10mg  qd.   COPD/chronic bronchitis, takes Breo Ellipta qd  Past Medical History:  Diagnosis Date  . Anemia, iron deficiency    takes Ferrous Sulfate  daily  . Depression    takes Effexor daily  . Gait disorder 04/21/2014  . GERD (gastroesophageal reflux disease)    takes Omeprazole daily  . Herpes ocular    history of-takes Acyclovir daily  . High cholesterol    takes Atorvastatin daily  . History of hiatal hernia   . Hypertension    takes Metoprolol daily  . Insomnia    takes Xanax nightly  . Osteoarthritis of knee    bilateral  . Sciatic pain    Past Surgical History:  Procedure Laterality Date  . ABDOMINAL HYSTERECTOMY  1995   partial  . CARPAL TUNNEL RELEASE Right 07/21/2007  . CARPAL TUNNEL RELEASE Left 09/24/2007  . COLONOSCOPY    . DESCEMETS STRIPPING AUTOMATED ENDOTHELIAL KERATOPLASTY Left 03/14/2011  . DESCEMETS STRIPPING AUTOMATED ENDOTHELIAL KERATOPLASTY Right 08/20/2012  . EYE SURGERY Bilateral    cataract surgery  . EYE SURGERY Left    corneal transplant  . HERNIA REPAIR    . HIP ARTHROPLASTY Left 06/13/2013   Procedure: ARTHROPLASTY BIPOLAR HIP; Injection left shoulder;  Surgeon: Johnny Bridge, MD;  Location: [facility];  Service: Orthopedics;  Laterality: Left;  . JOINT REPLACEMENT     bilateral knees, left knee  . KNEE ARTHROSCOPY Right 05/11/2001  . KYPHOPLASTY N/A 04/10/2015   Procedure: T11 Kyphoplasty;  Surgeon: Jovita Gamma, MD;  Location: [facility];  Service: Neurosurgery;  Laterality: N/A;  T11 Kyphoplasty  . KYPHOPLASTY N/A 05/06/2015   Procedure: KYPHOPLASTY Thoracic  twelve;  Surgeon: Jovita Gamma, MD;  Location: [facility];  Service: Neurosurgery;  Laterality: N/A;  T12 Kyphoplasty  . LAPAROSCOPIC NISSEN FUNDOPLICATION  4/76/5465  . LUMBAR LAMINECTOMY/DECOMPRESSION MICRODISCECTOMY  08/29/2010   L2-S1  . SHOULDER ARTHROSCOPY WITH ROTATOR CUFF REPAIR AND SUBACROMIAL DECOMPRESSION Right 01/01/2000  . TONSILLECTOMY     as child  . TOTAL HIP REVISION Left 07/08/2014   Procedure: TOTAL HIP REVISION;  Surgeon: Kerin Salen, MD;  Location: [facility];  Service: Orthopedics;  Laterality: Left;  . TOTAL  KNEE ARTHROPLASTY Left 05/14/2004  . TOTAL KNEE ARTHROPLASTY  12/23/2011   Procedure: TOTAL KNEE ARTHROPLASTY;  Surgeon: Lorn Junes, MD;  Location: [facility];  Service: Orthopedics;  Laterality: Right;  DR [place] THIS CASE  . TRIGGER FINGER RELEASE Right 07/21/2007   thumb  . TRIGGER FINGER RELEASE Left 09/24/2007   middle finger  . TRIGGER FINGER RELEASE Right 03/10/2008   ring and little fingers  . TRIGGER FINGER RELEASE Right 09/02/2012   Procedure: RELEASE TRIGGER FINGER/A-1 PULLEY RIGHT INDEX FINGER;  Surgeon: Wynonia Sours, MD;  Location: [facility];  Service: Orthopedics;  Laterality: Right;  . TRIGGER FINGER RELEASE Left 12/22/2012   Procedure: RELEASE TRIGGER FINGER/A-1 PULLEY LEFT RING FINGER;  Surgeon: Wynonia Sours, MD;  Location: [facility];  Service: Orthopedics;  Laterality: Left;  Left     Allergies  Allergen Reactions  . Morphine And Related Other (See Comments)    AGITATION, STRANGE DREAMS  . Scopolamine Other (See Comments)    MENTAL CHANGES  . Atorvastatin Other (See Comments)    unknown  . Morphine Other (See Comments)    Disoriented, mood changes  . Nsaids Other (See Comments)    unknown  . Pravachol [Pravastatin Sodium] Other (See Comments)    unknown  . Pravastatin Other (See Comments)    unknown  . Teriparatide Other (See Comments)    unknown    Allergies as of 12/29/2019      Reactions   Morphine And Related Other (See Comments)   AGITATION, STRANGE DREAMS   Scopolamine Other (See Comments)   MENTAL CHANGES   Atorvastatin Other (See Comments)   unknown   Morphine Other (See Comments)   Disoriented, mood changes   Nsaids Other (See Comments)   unknown   Pravachol [pravastatin Sodium] Other (See Comments)   unknown   Pravastatin Other (See Comments)   unknown   Teriparatide Other (See Comments)   unknown      Medication List       Accurate as of December 29, 2019 11:59 PM. If you have any questions,  ask your nurse or doctor.        acetaminophen 500 MG tablet Commonly known as: TYLENOL Take 500 mg by mouth every 6 (six) hours as needed for mild pain or moderate pain. Also give 1 tablet  500 mg tab by mouth at bedtime   Non Aspirin NOT TO EXCEED 3,000 MG ACETAMINOPHEN IN 24 HRS (SCHEDULED + PRN DOSES).   acetaminophen 500 MG tablet Commonly known as: TYLENOL Take 500 mg by mouth at bedtime.   AQUAPHOR EX Apply 41 % topically daily as needed. Apply thin layer   Artificial Tears 0.2-0.2-1 % Soln Generic drug: Glycerin-Hypromellose-PEG 400 Place 1 drop into both eyes 4 (four) times daily.   aspirin EC 81 MG tablet Take 81 mg by mouth daily.   Breo Ellipta 100-25 MCG/INH Aepb Generic drug: fluticasone furoate-vilanterol  Inhale 1 puff into the lungs daily.   cholecalciferol 25 MCG (1000 UNIT) tablet Commonly known as: VITAMIN D3 Take 1,000 Units by mouth daily.   docusate sodium 100 MG capsule Commonly known as: COLACE Take 1 capsule by mouth daily as needed for mild constipation.   feeding supplement (PRO-STAT SUGAR FREE 64) Liqd Take 30 mLs by mouth 3 (three) times daily with meals.   magnesium hydroxide 400 MG/5ML suspension Commonly known as: MILK OF MAGNESIA Take 15 mLs by mouth at bedtime as needed for mild constipation.   memantine 10 MG tablet Commonly known as: NAMENDA Take 10 mg by mouth daily.   metoprolol tartrate 25 MG tablet Commonly known as: LOPRESSOR Take 25 mg by mouth at bedtime.   metoprolol tartrate 50 MG tablet Commonly known as: LOPRESSOR Take 50 mg by mouth daily. DAILY 07:00 AM - 10:00 AM   mirtazapine 7.5 MG tablet Commonly known as: REMERON Take 7.5 mg by mouth at bedtime.   nitroGLYCERIN 0.4 MG SL tablet Commonly known as: NITROSTAT Place 0.4 mg under the tongue every 5 (five) minutes as needed for chest pain.   NON FORMULARY Special Instructions: Regular diet with thin liquids   omeprazole 10 MG capsule Commonly known as:  PRILOSEC Take 10 mg by mouth daily.   Systane Nighttime Oint Apply to both eyes at bedtime       Review of Systems  Constitutional: Negative for activity change, appetite change and fever.  HENT: Positive for hearing loss. Negative for congestion and voice change.   Eyes: Negative for visual disturbance.  Respiratory: Negative for cough and wheezing.   Cardiovascular: Negative for leg swelling.  Gastrointestinal: Negative for abdominal pain and constipation.  Genitourinary: Negative for difficulty urinating, dysuria and urgency.  Musculoskeletal: Positive for arthralgias, back pain and gait problem.  Skin: Negative for color change.  Neurological: Negative for dizziness, speech difficulty and weakness.       Dementia  Psychiatric/Behavioral: Positive for confusion. Negative for behavioral problems and sleep disturbance. The patient is not nervous/anxious.     Immunization History  Administered Date(s) Administered  . Influenza Whole 03/27/2018  . Influenza, High Dose Seasonal PF 03/26/2019  . Influenza-Unspecified 04/14/2017  . Moderna SARS-COVID-2 Vaccination 07/24/2019, 08/21/2019  . Pneumococcal Conjugate-13 09/24/2017  . Pneumococcal Polysaccharide-23 06/14/2013  . Td 09/25/2017  . Varicella 06/29/2003   Pertinent  Health Maintenance Due  Topic Date Due  . INFLUENZA VACCINE  01/23/2020  . DEXA SCAN  Completed  . PNA vac Low Risk Adult  Completed   Fall Risk  09/19/2017  Falls in the past year? No   Functional Status Survey:    Vitals:   12/29/19 1546  BP: 118/72  Pulse: 62  Resp: 18  Temp: 97.6 F (36.4 C)  SpO2: 94%   There is no height or weight on file to calculate BMI. Physical Exam Vitals and nursing note reviewed.  Constitutional:      Appearance: Normal appearance.  HENT:     Head: Normocephalic and atraumatic.  Eyes:     Extraocular Movements: Extraocular movements intact.     Conjunctiva/sclera: Conjunctivae normal.     Pupils: Pupils are  equal, round, and reactive to light.  Cardiovascular:     Rate and Rhythm: Normal rate and regular rhythm.     Heart sounds: No murmur heard.   Pulmonary:     Breath sounds: No rales.  Abdominal:     Palpations: Abdomen is soft.     Tenderness: There  is no abdominal tenderness.  Musculoskeletal:     Cervical back: Normal range of motion.     Right lower leg: No edema.     Left lower leg: No edema.  Skin:    General: Skin is warm and dry.  Neurological:     General: No focal deficit present.     Mental Status: She is alert. Mental status is at baseline.     Gait: Gait abnormal.     Comments: Oriented to self.   Psychiatric:        Mood and Affect: Mood normal.        Behavior: Behavior normal.     Labs reviewed: Recent Labs    07/05/19 0000 08/03/19 0000 10/14/19 0000  NA 134* 139 139  K 4.5 4.4 4.9  CL 103 108 105  CO2 23* 26* 28*  BUN 11 14 19   CREATININE 0.6 0.6 0.7  CALCIUM 8.5* 8.4* 9.2   Recent Labs    06/29/19 0000 08/03/19 0000 10/14/19 0000  AST 15 22 26   ALT 18 19 23   ALKPHOS 96 74 66  ALBUMIN 3.3* 2.8* 3.6   Recent Labs    07/02/19 0000 08/03/19 0000 10/14/19 0000  WBC 9.5 5.3 5.0  NEUTROABS 6,365 1,882 2,040  HGB 13.9 11.2* 11.9*  HCT 41 33* 37  PLT 335 303 270   Lab Results  Component Value Date   TSH 2.68 10/14/2019   Lab Results  Component Value Date   HGBA1C 5.8 06/29/2013   No results found for: CHOL, HDL, LDLCALC, LDLDIRECT, TRIG, CHOLHDL  Significant Diagnostic Results in last 30 days:  No results found.  Assessment/Plan  Chronic bronchitis (HCC) Stable, continue Breo Ellipta qd  GERD (gastroesophageal reflux disease) Stable, continue Omeprazole.   Vascular dementia ([place]) No behavioral issues, continue SN[facility] for safety, care assistance, continue Memantine.   Osteoarthritis Multiples sites, degenerative and s/p traumatic events, continue Tylenol for pain, w/c for mobility.   Depression, recurrent ([place]) Her  mood is stable, continue Mirtazapine.   Tachycardia Sinus tachycardia, heart rate is controlled, continue Metoprolol   Family/ staff Communication: plan of care reviewed with the patient and charge nurse.   Labs/tests ordered:  none  Time spend 25 minutes.

## 2019-12-29 NOTE — Assessment & Plan Note (Signed)
Sinus tachycardia, heart rate is controlled, continue Metoprolol

## 2019-12-29 NOTE — Assessment & Plan Note (Signed)
Her mood is stable, continue Mirtazapine.

## 2019-12-29 NOTE — Assessment & Plan Note (Signed)
No behavioral issues, continue SN[facility] for safety, care assistance, continue Memantine.

## 2019-12-29 NOTE — Assessment & Plan Note (Signed)
Multiples sites, degenerative and s/p traumatic events, continue Tylenol for pain, w/c for mobility.

## 2019-12-29 NOTE — Assessment & Plan Note (Signed)
Stable, continue Omeprazole.  

## 2020-01-26 ENCOUNTER — Encounter: Payer: Self-pay | Admitting: Nurse Practitioner

## 2020-01-26 ENCOUNTER — Non-Acute Institutional Stay (SKILLED_NURSING_FACILITY): Payer: Medicare Other | Admitting: Nurse Practitioner

## 2020-01-26 DIAGNOSIS — R000 Tachycardia, unspecified: Secondary | ICD-10-CM

## 2020-01-26 DIAGNOSIS — F0150 Vascular dementia without behavioral disturbance: Secondary | ICD-10-CM

## 2020-01-26 DIAGNOSIS — M1990 Unspecified osteoarthritis, unspecified site: Secondary | ICD-10-CM | POA: Diagnosis not present

## 2020-01-26 DIAGNOSIS — J42 Unspecified chronic bronchitis: Secondary | ICD-10-CM | POA: Diagnosis not present

## 2020-01-26 DIAGNOSIS — F339 Major depressive disorder, recurrent, unspecified: Secondary | ICD-10-CM

## 2020-01-26 DIAGNOSIS — K219 Gastro-esophageal reflux disease without esophagitis: Secondary | ICD-10-CM

## 2020-01-26 NOTE — Assessment & Plan Note (Signed)
Stable, continue Omeprazole.  

## 2020-01-26 NOTE — Progress Notes (Addendum)
Location:   [facility] Room Number: 42B Place of Service:  SNF (31) Provider:  Marlana Latus, NP  [name], MD  Patient Care Team: [name], MD as PCP - General (Internal Medicine) [name], MD as Referring Physician (Ophthalmology) Areyanna Figeroa X, NP as Nurse Practitioner (Internal Medicine)  Extended Emergency Contact Information Primary Emergency Contact: [name] Address: [address]          [place], [place] [address] [name] of [place] Phone: [phone] Mobile Phone: [phone] Relation: Son Secondary Emergency Contact: [name] Address: [address]          [place],  [address] [name] of [place] Phone: [phone] Mobile Phone: [phone] Relation: Son  Code Status:  DNR Goals of care: Advanced Directive information Advanced Directives 02/29/2020  Does Patient Have a Medical Advance Directive? Yes  Type of Paramedic of [place];Out of facility DNR (pink MOST or yellow form)  Does patient want to make changes to medical advance directive? No - Patient declined  Copy of [place] in Chart? -  Would patient like information on creating a medical advance directive? -  Pre-existing out of facility DNR order (yellow form or pink MOST form) Pink MOST form placed in chart (order not valid for inpatient use);Yellow form placed in chart (order not valid for inpatient use)     Chief Complaint  Patient presents with   Medical Management of Chronic Issues    Routine follow up visit.    HPI:  Pt is a 86 y.o. female seen today for medical management of chronic diseases.    Sleeps/eats at her baseline, takesMirtazapine 7.5mg  qd.              Heart rate is in control, on Metoprolol 50mg  qd, 25mg  qd.                 OA pain is controlled, on Tylenol 500mg  qhs and prn.              Her memory is preserved on Mamantine 10mg  qd.                 GERD, stable, on Omeprazole 10mg  qd.            COPD/chronic bronchitis, takes Breo Ellipta qd   Past Medical History:  Diagnosis Date   Anemia, iron deficiency    takes Ferrous Sulfate daily   Depression    takes Effexor daily   Gait disorder 04/21/2014   GERD (gastroesophageal reflux disease)    takes Omeprazole daily   Herpes ocular    history of-takes Acyclovir daily   High cholesterol    takes Atorvastatin daily   History of hiatal hernia    Hypertension    takes Metoprolol daily   Insomnia    takes Xanax nightly   Osteoarthritis of knee    bilateral   Sciatic pain    Past Surgical History:  Procedure Laterality Date   ABDOMINAL HYSTERECTOMY  1995   partial   CARPAL TUNNEL RELEASE Right 07/21/2007   CARPAL TUNNEL RELEASE Left 09/24/2007   COLONOSCOPY     DESCEMETS STRIPPING AUTOMATED ENDOTHELIAL KERATOPLASTY Left 03/14/2011   DESCEMETS STRIPPING AUTOMATED ENDOTHELIAL KERATOPLASTY Right 08/20/2012   EYE SURGERY Bilateral    cataract surgery   EYE SURGERY Left    corneal transplant   HERNIA REPAIR     HIP ARTHROPLASTY Left 06/13/2013   Procedure: ARTHROPLASTY BIPOLAR  HIP; Injection left shoulder;  Surgeon: Johnny Bridge, MD;  Location: [facility];  Service: Orthopedics;  Laterality: Left;   JOINT REPLACEMENT     bilateral knees, left knee   KNEE ARTHROSCOPY Right 05/11/2001   KYPHOPLASTY N/A 04/10/2015   Procedure: T11 Kyphoplasty;  Surgeon: Jovita Gamma, MD;  Location: [facility];  Service: Neurosurgery;  Laterality: N/A;  T11 Kyphoplasty   KYPHOPLASTY N/A 05/06/2015   Procedure: KYPHOPLASTY Thoracic twelve;  Surgeon: Jovita Gamma, MD;  Location: [facility];  Service: Neurosurgery;  Laterality: N/A;  T12 Kyphoplasty   LAPAROSCOPIC NISSEN FUNDOPLICATION  9/48/5462   LUMBAR LAMINECTOMY/DECOMPRESSION MICRODISCECTOMY  08/29/2010   L2-S1   SHOULDER ARTHROSCOPY WITH ROTATOR CUFF REPAIR AND SUBACROMIAL DECOMPRESSION Right 01/01/2000    TONSILLECTOMY     as child   TOTAL HIP REVISION Left 07/08/2014   Procedure: TOTAL HIP REVISION;  Surgeon: Kerin Salen, MD;  Location: [facility];  Service: Orthopedics;  Laterality: Left;   TOTAL KNEE ARTHROPLASTY Left 05/14/2004   TOTAL KNEE ARTHROPLASTY  12/23/2011   Procedure: TOTAL KNEE ARTHROPLASTY;  Surgeon: Lorn Junes, MD;  Location: [facility];  Service: Orthopedics;  Laterality: Right;  DR Noemi Chapel WANTS 90 MINUTES FOR THIS CASE   TRIGGER FINGER RELEASE Right 07/21/2007   thumb   TRIGGER FINGER RELEASE Left 09/24/2007   middle finger   TRIGGER FINGER RELEASE Right 03/10/2008   ring and little fingers   TRIGGER FINGER RELEASE Right 09/02/2012   Procedure: RELEASE TRIGGER FINGER/A-1 PULLEY RIGHT INDEX FINGER;  Surgeon: Wynonia Sours, MD;  Location: [facility];  Service: Orthopedics;  Laterality: Right;   TRIGGER FINGER RELEASE Left 12/22/2012   Procedure: RELEASE TRIGGER FINGER/A-1 PULLEY LEFT RING FINGER;  Surgeon: Wynonia Sours, MD;  Location: [facility];  Service: Orthopedics;  Laterality: Left;  Left     Allergies  Allergen Reactions   Morphine And Related Other (See Comments)    AGITATION, STRANGE DREAMS   Scopolamine Other (See Comments)    MENTAL CHANGES   Atorvastatin Other (See Comments)    unknown   Morphine Other (See Comments)    Disoriented, mood changes   Nsaids Other (See Comments)    unknown   Pravachol [Pravastatin Sodium] Other (See Comments)    unknown   Pravastatin Other (See Comments)    unknown   Teriparatide Other (See Comments)    unknown    Allergies as of 01/26/2020      Reactions   Morphine And Related Other (See Comments)   AGITATION, STRANGE DREAMS   Scopolamine Other (See Comments)   MENTAL CHANGES   Atorvastatin Other (See Comments)   unknown   Morphine Other (See Comments)   Disoriented, mood changes   Nsaids Other (See Comments)   unknown   Pravachol [pravastatin Sodium] Other (See Comments)    unknown   Pravastatin Other (See Comments)   unknown   Teriparatide Other (See Comments)   unknown      Medication List       Accurate as of January 26, 2020 11:59 PM. If you have any questions, ask your nurse or doctor.        STOP taking these medications   docusate sodium 100 MG capsule Commonly known as: COLACE Stopped by: Helina Hullum X Vir Whetstine, NP   feeding supplement (PRO-STAT SUGAR FREE 64) Liqd Stopped by: Dream Nodal X Jayvan Mcshan, NP     TAKE these medications   acetaminophen 500 MG tablet Commonly known as: TYLENOL  Take 500 mg by mouth at bedtime. What changed: Another medication with the same name was removed. Continue taking this medication, and follow the directions you see here. Changed by: Yizel Canby X Paetyn Pietrzak, NP   AQUAPHOR EX Apply 41 % topically daily as needed. Apply thin layer   Artificial Tears 0.2-0.2-1 % Soln Generic drug: Glycerin-Hypromellose-PEG 400 Place 1 drop into both eyes 4 (four) times daily.   aspirin EC 81 MG tablet Take 81 mg by mouth daily.   Breo Ellipta 100-25 MCG/INH Aepb Generic drug: fluticasone furoate-vilanterol Inhale 1 puff into the lungs daily.   cholecalciferol 25 MCG (1000 UNIT) tablet Commonly known as: VITAMIN D3 Take 1,000 Units by mouth daily.   magnesium hydroxide 400 MG/5ML suspension Commonly known as: MILK OF MAGNESIA Take 15 mLs by mouth at bedtime as needed for mild constipation.   memantine 10 MG tablet Commonly known as: NAMENDA Take 10 mg by mouth 2 (two) times daily.   metoprolol tartrate 25 MG tablet Commonly known as: LOPRESSOR Take 25 mg by mouth at bedtime.   metoprolol tartrate 50 MG tablet Commonly known as: LOPRESSOR Take 50 mg by mouth daily. DAILY 07:00 AM - 10:00 AM   mirtazapine 15 MG tablet Commonly known as: REMERON Take 7.5 mg by mouth at bedtime.   nitroGLYCERIN 0.4 MG SL tablet Commonly known as: NITROSTAT Place 0.4 mg under the tongue every 5 (five) minutes as needed for chest pain.   NON  FORMULARY Special Instructions: Regular diet with thin liquids   omeprazole 10 MG capsule Commonly known as: PRILOSEC Take 10 mg by mouth daily.   Systane Nighttime Oint Apply to both eyes at bedtime       Review of Systems  Constitutional: Negative for fatigue, fever and unexpected weight change.  HENT: Positive for hearing loss. Negative for congestion and voice change.   Eyes: Negative for visual disturbance.  Respiratory: Negative for cough and wheezing.   Cardiovascular: Negative for leg swelling.  Gastrointestinal: Negative for abdominal distention and constipation.  Genitourinary: Negative for dysuria and urgency.  Musculoskeletal: Positive for arthralgias, back pain and gait problem.  Skin: Negative for color change.  Neurological: Negative for speech difficulty, weakness and headaches.       Dementia  Psychiatric/Behavioral: Positive for confusion. Negative for agitation and behavioral problems. The patient is not nervous/anxious.     Immunization History  Administered Date(s) Administered   Influenza Whole 03/27/2018   Influenza, High Dose Seasonal PF 03/26/2019   Influenza-Unspecified 04/14/2017, 04/05/2020   Moderna SARS-COVID-2 Vaccination 07/24/2019, 08/21/2019   Pneumococcal Conjugate-13 09/24/2017   Pneumococcal Polysaccharide-23 06/14/2013   Td 09/25/2017   Varicella 06/29/2003   Pertinent  Health Maintenance Due  Topic Date Due   INFLUENZA VACCINE  Completed   DEXA SCAN  Completed   PNA vac Low Risk Adult  Completed   Fall Risk  09/19/2017  Falls in the past year? No   Functional Status Survey:    Vitals:   01/26/20 1144  BP: (!) 116/59  Pulse: 61  Resp: 20  Temp: (!) 97.3 F (36.3 C)  SpO2: 95%  Weight: 150 lb (68 kg)  Height: 5\' 1"  (1.549 m)   Body mass index is 28.34 kg/m. Physical Exam Vitals and nursing note reviewed.  Constitutional:      Appearance: Normal appearance.  HENT:     Head: Normocephalic and atraumatic.   Eyes:     Extraocular Movements: Extraocular movements intact.     Conjunctiva/sclera: Conjunctivae normal.  Pupils: Pupils are equal, round, and reactive to light.  Cardiovascular:     Rate and Rhythm: Normal rate and regular rhythm.     Heart sounds: No murmur heard.   Pulmonary:     Breath sounds: No rales.  Abdominal:     General: Bowel sounds are normal.     Palpations: Abdomen is soft.     Tenderness: There is no abdominal tenderness.  Musculoskeletal:     Cervical back: Normal range of motion and neck supple.     Right lower leg: No edema.     Left lower leg: No edema.  Skin:    General: Skin is warm and dry.  Neurological:     General: No focal deficit present.     Mental Status: She is alert. Mental status is at baseline.     Gait: Gait abnormal.     Comments: Oriented to self.   Psychiatric:        Mood and Affect: Mood normal.     Labs reviewed: Recent Labs    07/05/19 0000 08/03/19 0000 10/14/19 0000  NA 134* 139 139  K 4.5 4.4 4.9  CL 103 108 105  CO2 23* 26* 28*  BUN 11 14 19   CREATININE 0.6 0.6 0.7  CALCIUM 8.5* 8.4* 9.2   Recent Labs    06/29/19 0000 08/03/19 0000 10/14/19 0000  AST 15 22 26   ALT 18 19 23   ALKPHOS 96 74 66  ALBUMIN 3.3* 2.8* 3.6   Recent Labs    08/03/19 0000 10/14/19 0000 11/25/19 0000  WBC 5.3 5.0 5.0  NEUTROABS 1,882 2,040 1,545  HGB 11.2* 11.9* 11.6*  HCT 33* 37 35*  PLT 303 270 228   Lab Results  Component Value Date   TSH 2.68 10/14/2019   Lab Results  Component Value Date   HGBA1C 5.8 06/29/2013   No results found for: CHOL, HDL, LDLCALC, LDLDIRECT, TRIG, CHOLHDL  Significant Diagnostic Results in last 30 days:  No results found.  Assessment/Plan Chronic bronchitis (HCC) Stable, continue Breo Ellipta  GERD (gastroesophageal reflux disease) Stable, continue Omeprazole.   Vascular dementia ([place]) No behavioral issues, continue Mamentine   Osteoarthritis In general, continue Tylenol, w/c  for mobility.   Depression, recurrent ([place]) Mood, weight(#150Ibs ? erroneous measurement), sleep at her baseline, continue Mirtazapine.   Tachycardia Heart rate is in control, continue Metoprolol.      Family/ staff Communication: plan of care reviewed with the patient and charge nurse.   Labs/tests ordered: none   Time spend 25 minutes.

## 2020-01-26 NOTE — Assessment & Plan Note (Addendum)
Mood, weight(#150Ibs ? erroneous measurement), sleep at her baseline, continue Mirtazapine.

## 2020-01-26 NOTE — Assessment & Plan Note (Signed)
No behavioral issues, continue Mamentine

## 2020-01-26 NOTE — Assessment & Plan Note (Signed)
Heart rate is in control, continue Metoprolol  

## 2020-01-26 NOTE — Assessment & Plan Note (Signed)
Stable, continue Breo Ellipta

## 2020-01-26 NOTE — Assessment & Plan Note (Signed)
In general, continue Tylenol, w/c for mobility.

## 2020-02-29 ENCOUNTER — Encounter: Payer: Self-pay | Admitting: Internal Medicine

## 2020-02-29 ENCOUNTER — Non-Acute Institutional Stay (SKILLED_NURSING_FACILITY): Payer: Medicare Other | Admitting: Internal Medicine

## 2020-02-29 DIAGNOSIS — F339 Major depressive disorder, recurrent, unspecified: Secondary | ICD-10-CM | POA: Diagnosis not present

## 2020-02-29 DIAGNOSIS — F0150 Vascular dementia without behavioral disturbance: Secondary | ICD-10-CM | POA: Diagnosis not present

## 2020-02-29 DIAGNOSIS — D509 Iron deficiency anemia, unspecified: Secondary | ICD-10-CM | POA: Diagnosis not present

## 2020-02-29 DIAGNOSIS — I10 Essential (primary) hypertension: Secondary | ICD-10-CM | POA: Diagnosis not present

## 2020-02-29 NOTE — Progress Notes (Signed)
Location:   [facility] Room Number: [place] of Service:  SNF [phone]) Provider:  Veleta Miners, MD  [name], MD  Patient Care Team: [name], MD as PCP - General (Internal Medicine) [name], MD as Referring Physician (Ophthalmology) Mast, Man X, NP as Nurse Practitioner (Internal Medicine)  Extended Emergency Contact Information Primary Emergency Contact: [name] Address: [address]          [place],  [address] [name] of [place] Phone: [phone] Mobile Phone: [phone] Relation: Son Secondary Emergency Contact: [name] Address: [address]          [place], [place] [address] [name] of [place] Phone: [phone] Mobile Phone: [phone] Relation: Son  Code Status:  DNR Goals of care: Advanced Directive information Advanced Directives 02/29/2020  Does Patient Have a Medical Advance Directive? Yes  Type of Paramedic of [place];Out of facility DNR (pink MOST or yellow form)  Does patient want to make changes to medical advance directive? No - Patient declined  Copy of [place] in Chart? -  Would patient like information on creating a medical advance directive? -  Pre-existing out of facility DNR order (yellow form or pink MOST form) Pink MOST form placed in chart (order not valid for inpatient use);Yellow form placed in chart (order not valid for inpatient use)     Chief Complaint  Patient presents with  . Medical Management of Chronic Issues    Routine follow up visit  . Best Practice Recommendations    Flu vaccine    HPI:  Pt is a 86 y.o. female seen today for medical management of chronic diseases.   Patient has a history ofHypertension, anemia, depression,Hyperlipidemia,COPD, hyponatremia,Depression with Anxiety and Dementia And Covid Pneumonia  Patient is a long-term resident of facility  Doing well. Weight is  good. Appetite is good. Behavior is stable No New Nursing issues  Past Medical History:  Diagnosis Date  . Anemia, iron deficiency    takes Ferrous Sulfate daily  . Depression    takes Effexor daily  . Gait disorder 04/21/2014  . GERD (gastroesophageal reflux disease)    takes Omeprazole daily  . Herpes ocular    history of-takes Acyclovir daily  . High cholesterol    takes Atorvastatin daily  . History of hiatal hernia   . Hypertension    takes Metoprolol daily  . Insomnia    takes Xanax nightly  . Osteoarthritis of knee    bilateral  . Sciatic pain    Past Surgical History:  Procedure Laterality Date  . ABDOMINAL HYSTERECTOMY  1995   partial  . CARPAL TUNNEL RELEASE Right 07/21/2007  . CARPAL TUNNEL RELEASE Left 09/24/2007  . COLONOSCOPY    . DESCEMETS STRIPPING AUTOMATED ENDOTHELIAL KERATOPLASTY Left 03/14/2011  . DESCEMETS STRIPPING AUTOMATED ENDOTHELIAL KERATOPLASTY Right 08/20/2012  . EYE SURGERY Bilateral    cataract surgery  . EYE SURGERY Left    corneal transplant  . HERNIA REPAIR    . HIP ARTHROPLASTY Left 06/13/2013   Procedure: ARTHROPLASTY BIPOLAR HIP; Injection left shoulder;  Surgeon: Johnny Bridge, MD;  Location: [facility];  Service: Orthopedics;  Laterality: Left;  . JOINT REPLACEMENT     bilateral knees, left knee  . KNEE ARTHROSCOPY Right 05/11/2001  . KYPHOPLASTY N/A 04/10/2015   Procedure: T11 Kyphoplasty;  Surgeon: Jovita Gamma, MD;  Location: [facility];  Service: Neurosurgery;  Laterality: N/A;  T11 Kyphoplasty  .  KYPHOPLASTY N/A 05/06/2015   Procedure: KYPHOPLASTY Thoracic twelve;  Surgeon: Jovita Gamma, MD;  Location: [facility];  Service: Neurosurgery;  Laterality: N/A;  T12 Kyphoplasty  . LAPAROSCOPIC NISSEN FUNDOPLICATION  1/50/5697  . LUMBAR LAMINECTOMY/DECOMPRESSION MICRODISCECTOMY  08/29/2010   L2-S1  . SHOULDER ARTHROSCOPY WITH ROTATOR CUFF REPAIR AND SUBACROMIAL DECOMPRESSION Right 01/01/2000  . TONSILLECTOMY     as child  .  TOTAL HIP REVISION Left 07/08/2014   Procedure: TOTAL HIP REVISION;  Surgeon: Kerin Salen, MD;  Location: [facility];  Service: Orthopedics;  Laterality: Left;  . TOTAL KNEE ARTHROPLASTY Left 05/14/2004  . TOTAL KNEE ARTHROPLASTY  12/23/2011   Procedure: TOTAL KNEE ARTHROPLASTY;  Surgeon: Lorn Junes, MD;  Location: [facility];  Service: Orthopedics;  Laterality: Right;  DR [place] THIS CASE  . TRIGGER FINGER RELEASE Right 07/21/2007   thumb  . TRIGGER FINGER RELEASE Left 09/24/2007   middle finger  . TRIGGER FINGER RELEASE Right 03/10/2008   ring and little fingers  . TRIGGER FINGER RELEASE Right 09/02/2012   Procedure: RELEASE TRIGGER FINGER/A-1 PULLEY RIGHT INDEX FINGER;  Surgeon: Wynonia Sours, MD;  Location: [facility];  Service: Orthopedics;  Laterality: Right;  . TRIGGER FINGER RELEASE Left 12/22/2012   Procedure: RELEASE TRIGGER FINGER/A-1 PULLEY LEFT RING FINGER;  Surgeon: Wynonia Sours, MD;  Location: [facility];  Service: Orthopedics;  Laterality: Left;  Left     Allergies  Allergen Reactions  . Morphine And Related Other (See Comments)    AGITATION, STRANGE DREAMS  . Scopolamine Other (See Comments)    MENTAL CHANGES  . Atorvastatin Other (See Comments)    unknown  . Morphine Other (See Comments)    Disoriented, mood changes  . Nsaids Other (See Comments)    unknown  . Pravachol [Pravastatin Sodium] Other (See Comments)    unknown  . Pravastatin Other (See Comments)    unknown  . Teriparatide Other (See Comments)    unknown    Allergies as of 02/29/2020      Reactions   Morphine And Related Other (See Comments)   AGITATION, STRANGE DREAMS   Scopolamine Other (See Comments)   MENTAL CHANGES   Atorvastatin Other (See Comments)   unknown   Morphine Other (See Comments)   Disoriented, mood changes   Nsaids Other (See Comments)   unknown   Pravachol [pravastatin Sodium] Other (See Comments)   unknown   Pravastatin Other (See  Comments)   unknown   Teriparatide Other (See Comments)   unknown      Medication List       Accurate as of February 29, 2020  9:00 AM. If you have any questions, ask your nurse or doctor.        STOP taking these medications   AQUAPHOR EX Stopped by: [name], MD     TAKE these medications   acetaminophen 500 MG tablet Commonly known as: TYLENOL Take 500 mg by mouth at bedtime.   Artificial Tears 0.2-0.2-1 % Soln Generic drug: Glycerin-Hypromellose-PEG 400 Place 1 drop into both eyes 4 (four) times daily.   aspirin EC 81 MG tablet Take 81 mg by mouth daily.   Breo Ellipta 100-25 MCG/INH Aepb Generic drug: fluticasone furoate-vilanterol Inhale 1 puff into the lungs daily.   cholecalciferol 25 MCG (1000 UNIT) tablet Commonly known as: VITAMIN D3 Take 1,000 Units by mouth daily.   magnesium hydroxide 400 MG/5ML suspension Commonly known as: MILK OF  MAGNESIA Take 15 mLs by mouth at bedtime as needed for mild constipation.   memantine 10 MG tablet Commonly known as: NAMENDA Take 10 mg by mouth 2 (two) times daily.   metoprolol tartrate 25 MG tablet Commonly known as: LOPRESSOR Take 25 mg by mouth at bedtime.   metoprolol tartrate 50 MG tablet Commonly known as: LOPRESSOR Take 50 mg by mouth daily. DAILY 07:00 AM - 10:00 AM   mirtazapine 15 MG tablet Commonly known as: REMERON Take 7.5 mg by mouth at bedtime.   nitroGLYCERIN 0.4 MG SL tablet Commonly known as: NITROSTAT Place 0.4 mg under the tongue every 5 (five) minutes as needed for chest pain.   NON FORMULARY Special Instructions: Regular diet with thin liquids   omeprazole 10 MG capsule Commonly known as: PRILOSEC Take 10 mg by mouth daily.   Systane Nighttime Oint Apply to both eyes at bedtime       Review of Systems  Unable to perform ROS: Dementia    Immunization History  Administered Date(s) Administered  . Influenza Whole 03/27/2018  . Influenza, High Dose Seasonal PF  03/26/2019  . Influenza-Unspecified 04/14/2017  . Moderna SARS-COVID-2 Vaccination 07/24/2019, 08/21/2019  . Pneumococcal Conjugate-13 09/24/2017  . Pneumococcal Polysaccharide-23 06/14/2013  . Td 09/25/2017  . Varicella 06/29/2003   Pertinent  Health Maintenance Due  Topic Date Due  . INFLUENZA VACCINE  01/23/2020  . DEXA SCAN  Completed  . PNA vac Low Risk Adult  Completed   Fall Risk  09/19/2017  Falls in the past year? No   Functional Status Survey:    Vitals:   02/29/20 0846  BP: 132/67  Pulse: 75  Resp: 20  Temp: 98.4 F (36.9 C)  SpO2: 93%  Weight: 108 lb 11.2 oz (49.3 kg)  Height: 5\' 1"  (1.549 m)   Body mass index is 20.54 kg/m. Physical Exam Vitals reviewed.  Constitutional:      Appearance: Normal appearance.  HENT:     Head: Normocephalic.     Nose: Nose normal.     Mouth/Throat:     Mouth: Mucous membranes are moist.     Pharynx: Oropharynx is clear.  Eyes:     Pupils: Pupils are equal, round, and reactive to light.  Cardiovascular:     Rate and Rhythm: Normal rate and regular rhythm.     Pulses: Normal pulses.     Heart sounds: Normal heart sounds.  Pulmonary:     Effort: Pulmonary effort is normal.     Breath sounds: Normal breath sounds.  Abdominal:     General: Abdomen is flat. Bowel sounds are normal.     Palpations: Abdomen is soft.  Musculoskeletal:        General: No swelling.     Cervical back: Neck supple.  Skin:    General: Skin is warm.  Neurological:     General: No focal deficit present.     Mental Status: She is alert.  Psychiatric:        Mood and Affect: Mood normal.        Thought Content: Thought content normal.     Labs reviewed: Recent Labs    07/05/19 0000 08/03/19 0000 10/14/19 0000  NA 134* 139 139  K 4.5 4.4 4.9  CL 103 108 105  CO2 23* 26* 28*  BUN 11 14 19   CREATININE 0.6 0.6 0.7  CALCIUM 8.5* 8.4* 9.2   Recent Labs    06/29/19 0000 08/03/19 0000 10/14/19 0000  AST 15  22 26  ALT 18 19 23     ALKPHOS 96 74 66  ALBUMIN 3.3* 2.8* 3.6   Recent Labs    07/02/19 0000 08/03/19 0000 10/14/19 0000  WBC 9.5 5.3 5.0  NEUTROABS 6,365 1,882 2,040  HGB 13.9 11.2* 11.9*  HCT 41 33* 37  PLT 335 303 270   Lab Results  Component Value Date   TSH 2.68 10/14/2019   Lab Results  Component Value Date   HGBA1C 5.8 06/29/2013   No results found for: CHOL, HDL, LDLCALC, LDLDIRECT, TRIG, CHOLHDL  Significant Diagnostic Results in last 30 days:  No results found.  Assessment/Plan  Essential hypertension Stable on Lopressor Iron deficiency anemia, unspecified iron deficiency anemia type Hgb Stable off iron Depression, recurrent (HCC) On Remeron Vascular dementia without behavioral disturbance ([place]) Doing well on Namenda COPD On Breo GERD On Prilosec   Family/ staff Communication:   Labs/tests ordered:

## 2020-03-16 DIAGNOSIS — R296 Repeated falls: Secondary | ICD-10-CM | POA: Diagnosis not present

## 2020-03-16 DIAGNOSIS — F339 Major depressive disorder, recurrent, unspecified: Secondary | ICD-10-CM | POA: Diagnosis not present

## 2020-03-16 DIAGNOSIS — M6281 Muscle weakness (generalized): Secondary | ICD-10-CM | POA: Diagnosis not present

## 2020-03-16 DIAGNOSIS — F0150 Vascular dementia without behavioral disturbance: Secondary | ICD-10-CM | POA: Diagnosis not present

## 2020-03-16 DIAGNOSIS — J42 Unspecified chronic bronchitis: Secondary | ICD-10-CM | POA: Diagnosis not present

## 2020-03-16 DIAGNOSIS — N3946 Mixed incontinence: Secondary | ICD-10-CM | POA: Diagnosis not present

## 2020-03-16 DIAGNOSIS — R29898 Other symptoms and signs involving the musculoskeletal system: Secondary | ICD-10-CM | POA: Diagnosis not present

## 2020-03-16 DIAGNOSIS — R000 Tachycardia, unspecified: Secondary | ICD-10-CM | POA: Diagnosis not present

## 2020-03-16 DIAGNOSIS — Z9181 History of falling: Secondary | ICD-10-CM | POA: Diagnosis not present

## 2020-03-16 DIAGNOSIS — L989 Disorder of the skin and subcutaneous tissue, unspecified: Secondary | ICD-10-CM | POA: Diagnosis not present

## 2020-03-16 DIAGNOSIS — K219 Gastro-esophageal reflux disease without esophagitis: Secondary | ICD-10-CM | POA: Diagnosis not present

## 2020-03-16 DIAGNOSIS — R300 Dysuria: Secondary | ICD-10-CM | POA: Diagnosis not present

## 2020-03-16 DIAGNOSIS — G548 Other nerve root and plexus disorders: Secondary | ICD-10-CM | POA: Diagnosis not present

## 2020-03-16 DIAGNOSIS — Z20822 Contact with and (suspected) exposure to covid-19: Secondary | ICD-10-CM | POA: Diagnosis not present

## 2020-03-16 DIAGNOSIS — R079 Chest pain, unspecified: Secondary | ICD-10-CM | POA: Diagnosis not present

## 2020-03-16 DIAGNOSIS — F329 Major depressive disorder, single episode, unspecified: Secondary | ICD-10-CM | POA: Diagnosis not present

## 2020-03-16 DIAGNOSIS — M1990 Unspecified osteoarthritis, unspecified site: Secondary | ICD-10-CM | POA: Diagnosis not present

## 2020-03-16 DIAGNOSIS — D509 Iron deficiency anemia, unspecified: Secondary | ICD-10-CM | POA: Diagnosis not present

## 2020-03-16 DIAGNOSIS — R1312 Dysphagia, oropharyngeal phase: Secondary | ICD-10-CM | POA: Diagnosis not present

## 2020-03-17 DIAGNOSIS — J42 Unspecified chronic bronchitis: Secondary | ICD-10-CM | POA: Diagnosis not present

## 2020-03-17 DIAGNOSIS — L989 Disorder of the skin and subcutaneous tissue, unspecified: Secondary | ICD-10-CM | POA: Diagnosis not present

## 2020-03-17 DIAGNOSIS — R1312 Dysphagia, oropharyngeal phase: Secondary | ICD-10-CM | POA: Diagnosis not present

## 2020-03-17 DIAGNOSIS — N3946 Mixed incontinence: Secondary | ICD-10-CM | POA: Diagnosis not present

## 2020-03-17 DIAGNOSIS — F339 Major depressive disorder, recurrent, unspecified: Secondary | ICD-10-CM | POA: Diagnosis not present

## 2020-03-17 DIAGNOSIS — M1990 Unspecified osteoarthritis, unspecified site: Secondary | ICD-10-CM | POA: Diagnosis not present

## 2020-03-20 DIAGNOSIS — L989 Disorder of the skin and subcutaneous tissue, unspecified: Secondary | ICD-10-CM | POA: Diagnosis not present

## 2020-03-20 DIAGNOSIS — R1312 Dysphagia, oropharyngeal phase: Secondary | ICD-10-CM | POA: Diagnosis not present

## 2020-03-20 DIAGNOSIS — F339 Major depressive disorder, recurrent, unspecified: Secondary | ICD-10-CM | POA: Diagnosis not present

## 2020-03-20 DIAGNOSIS — J42 Unspecified chronic bronchitis: Secondary | ICD-10-CM | POA: Diagnosis not present

## 2020-03-20 DIAGNOSIS — M1990 Unspecified osteoarthritis, unspecified site: Secondary | ICD-10-CM | POA: Diagnosis not present

## 2020-03-20 DIAGNOSIS — N3946 Mixed incontinence: Secondary | ICD-10-CM | POA: Diagnosis not present

## 2020-03-21 DIAGNOSIS — N3946 Mixed incontinence: Secondary | ICD-10-CM | POA: Diagnosis not present

## 2020-03-21 DIAGNOSIS — R1312 Dysphagia, oropharyngeal phase: Secondary | ICD-10-CM | POA: Diagnosis not present

## 2020-03-21 DIAGNOSIS — J42 Unspecified chronic bronchitis: Secondary | ICD-10-CM | POA: Diagnosis not present

## 2020-03-21 DIAGNOSIS — L989 Disorder of the skin and subcutaneous tissue, unspecified: Secondary | ICD-10-CM | POA: Diagnosis not present

## 2020-03-21 DIAGNOSIS — F339 Major depressive disorder, recurrent, unspecified: Secondary | ICD-10-CM | POA: Diagnosis not present

## 2020-03-21 DIAGNOSIS — M1990 Unspecified osteoarthritis, unspecified site: Secondary | ICD-10-CM | POA: Diagnosis not present

## 2020-03-22 DIAGNOSIS — L989 Disorder of the skin and subcutaneous tissue, unspecified: Secondary | ICD-10-CM | POA: Diagnosis not present

## 2020-03-22 DIAGNOSIS — F339 Major depressive disorder, recurrent, unspecified: Secondary | ICD-10-CM | POA: Diagnosis not present

## 2020-03-22 DIAGNOSIS — R1312 Dysphagia, oropharyngeal phase: Secondary | ICD-10-CM | POA: Diagnosis not present

## 2020-03-22 DIAGNOSIS — M1990 Unspecified osteoarthritis, unspecified site: Secondary | ICD-10-CM | POA: Diagnosis not present

## 2020-03-22 DIAGNOSIS — N3946 Mixed incontinence: Secondary | ICD-10-CM | POA: Diagnosis not present

## 2020-03-22 DIAGNOSIS — J42 Unspecified chronic bronchitis: Secondary | ICD-10-CM | POA: Diagnosis not present

## 2020-03-23 DIAGNOSIS — R1312 Dysphagia, oropharyngeal phase: Secondary | ICD-10-CM | POA: Diagnosis not present

## 2020-03-23 DIAGNOSIS — L989 Disorder of the skin and subcutaneous tissue, unspecified: Secondary | ICD-10-CM | POA: Diagnosis not present

## 2020-03-23 DIAGNOSIS — N3946 Mixed incontinence: Secondary | ICD-10-CM | POA: Diagnosis not present

## 2020-03-23 DIAGNOSIS — M1990 Unspecified osteoarthritis, unspecified site: Secondary | ICD-10-CM | POA: Diagnosis not present

## 2020-03-23 DIAGNOSIS — J42 Unspecified chronic bronchitis: Secondary | ICD-10-CM | POA: Diagnosis not present

## 2020-03-23 DIAGNOSIS — F339 Major depressive disorder, recurrent, unspecified: Secondary | ICD-10-CM | POA: Diagnosis not present

## 2020-03-27 DIAGNOSIS — Z9181 History of falling: Secondary | ICD-10-CM | POA: Diagnosis not present

## 2020-03-27 DIAGNOSIS — R296 Repeated falls: Secondary | ICD-10-CM | POA: Diagnosis not present

## 2020-03-27 DIAGNOSIS — M6281 Muscle weakness (generalized): Secondary | ICD-10-CM | POA: Diagnosis not present

## 2020-03-27 DIAGNOSIS — R000 Tachycardia, unspecified: Secondary | ICD-10-CM | POA: Diagnosis not present

## 2020-03-27 DIAGNOSIS — N3946 Mixed incontinence: Secondary | ICD-10-CM | POA: Diagnosis not present

## 2020-03-27 DIAGNOSIS — J42 Unspecified chronic bronchitis: Secondary | ICD-10-CM | POA: Diagnosis not present

## 2020-03-27 DIAGNOSIS — R1312 Dysphagia, oropharyngeal phase: Secondary | ICD-10-CM | POA: Diagnosis not present

## 2020-03-27 DIAGNOSIS — D509 Iron deficiency anemia, unspecified: Secondary | ICD-10-CM | POA: Diagnosis not present

## 2020-03-27 DIAGNOSIS — R29898 Other symptoms and signs involving the musculoskeletal system: Secondary | ICD-10-CM | POA: Diagnosis not present

## 2020-03-27 DIAGNOSIS — R300 Dysuria: Secondary | ICD-10-CM | POA: Diagnosis not present

## 2020-03-27 DIAGNOSIS — F0150 Vascular dementia without behavioral disturbance: Secondary | ICD-10-CM | POA: Diagnosis not present

## 2020-03-27 DIAGNOSIS — R079 Chest pain, unspecified: Secondary | ICD-10-CM | POA: Diagnosis not present

## 2020-03-27 DIAGNOSIS — G548 Other nerve root and plexus disorders: Secondary | ICD-10-CM | POA: Diagnosis not present

## 2020-03-27 DIAGNOSIS — Z20822 Contact with and (suspected) exposure to covid-19: Secondary | ICD-10-CM | POA: Diagnosis not present

## 2020-03-27 DIAGNOSIS — L989 Disorder of the skin and subcutaneous tissue, unspecified: Secondary | ICD-10-CM | POA: Diagnosis not present

## 2020-03-27 DIAGNOSIS — K219 Gastro-esophageal reflux disease without esophagitis: Secondary | ICD-10-CM | POA: Diagnosis not present

## 2020-03-27 DIAGNOSIS — F329 Major depressive disorder, single episode, unspecified: Secondary | ICD-10-CM | POA: Diagnosis not present

## 2020-03-28 DIAGNOSIS — M6281 Muscle weakness (generalized): Secondary | ICD-10-CM | POA: Diagnosis not present

## 2020-03-28 DIAGNOSIS — N3946 Mixed incontinence: Secondary | ICD-10-CM | POA: Diagnosis not present

## 2020-03-28 DIAGNOSIS — L989 Disorder of the skin and subcutaneous tissue, unspecified: Secondary | ICD-10-CM | POA: Diagnosis not present

## 2020-03-28 DIAGNOSIS — J42 Unspecified chronic bronchitis: Secondary | ICD-10-CM | POA: Diagnosis not present

## 2020-03-28 DIAGNOSIS — R1312 Dysphagia, oropharyngeal phase: Secondary | ICD-10-CM | POA: Diagnosis not present

## 2020-03-28 DIAGNOSIS — R29898 Other symptoms and signs involving the musculoskeletal system: Secondary | ICD-10-CM | POA: Diagnosis not present

## 2020-03-29 DIAGNOSIS — L989 Disorder of the skin and subcutaneous tissue, unspecified: Secondary | ICD-10-CM | POA: Diagnosis not present

## 2020-03-29 DIAGNOSIS — R29898 Other symptoms and signs involving the musculoskeletal system: Secondary | ICD-10-CM | POA: Diagnosis not present

## 2020-03-29 DIAGNOSIS — R1312 Dysphagia, oropharyngeal phase: Secondary | ICD-10-CM | POA: Diagnosis not present

## 2020-03-29 DIAGNOSIS — N3946 Mixed incontinence: Secondary | ICD-10-CM | POA: Diagnosis not present

## 2020-03-29 DIAGNOSIS — J42 Unspecified chronic bronchitis: Secondary | ICD-10-CM | POA: Diagnosis not present

## 2020-03-29 DIAGNOSIS — M6281 Muscle weakness (generalized): Secondary | ICD-10-CM | POA: Diagnosis not present

## 2020-03-30 DIAGNOSIS — M6281 Muscle weakness (generalized): Secondary | ICD-10-CM | POA: Diagnosis not present

## 2020-03-30 DIAGNOSIS — N3946 Mixed incontinence: Secondary | ICD-10-CM | POA: Diagnosis not present

## 2020-03-30 DIAGNOSIS — L989 Disorder of the skin and subcutaneous tissue, unspecified: Secondary | ICD-10-CM | POA: Diagnosis not present

## 2020-03-30 DIAGNOSIS — R1312 Dysphagia, oropharyngeal phase: Secondary | ICD-10-CM | POA: Diagnosis not present

## 2020-03-30 DIAGNOSIS — R29898 Other symptoms and signs involving the musculoskeletal system: Secondary | ICD-10-CM | POA: Diagnosis not present

## 2020-03-30 DIAGNOSIS — J42 Unspecified chronic bronchitis: Secondary | ICD-10-CM | POA: Diagnosis not present

## 2020-03-31 DIAGNOSIS — J42 Unspecified chronic bronchitis: Secondary | ICD-10-CM | POA: Diagnosis not present

## 2020-03-31 DIAGNOSIS — R29898 Other symptoms and signs involving the musculoskeletal system: Secondary | ICD-10-CM | POA: Diagnosis not present

## 2020-03-31 DIAGNOSIS — N3946 Mixed incontinence: Secondary | ICD-10-CM | POA: Diagnosis not present

## 2020-03-31 DIAGNOSIS — R1312 Dysphagia, oropharyngeal phase: Secondary | ICD-10-CM | POA: Diagnosis not present

## 2020-03-31 DIAGNOSIS — L989 Disorder of the skin and subcutaneous tissue, unspecified: Secondary | ICD-10-CM | POA: Diagnosis not present

## 2020-03-31 DIAGNOSIS — M6281 Muscle weakness (generalized): Secondary | ICD-10-CM | POA: Diagnosis not present

## 2020-04-03 DIAGNOSIS — L989 Disorder of the skin and subcutaneous tissue, unspecified: Secondary | ICD-10-CM | POA: Diagnosis not present

## 2020-04-03 DIAGNOSIS — M6281 Muscle weakness (generalized): Secondary | ICD-10-CM | POA: Diagnosis not present

## 2020-04-03 DIAGNOSIS — N3946 Mixed incontinence: Secondary | ICD-10-CM | POA: Diagnosis not present

## 2020-04-03 DIAGNOSIS — J42 Unspecified chronic bronchitis: Secondary | ICD-10-CM | POA: Diagnosis not present

## 2020-04-03 DIAGNOSIS — R29898 Other symptoms and signs involving the musculoskeletal system: Secondary | ICD-10-CM | POA: Diagnosis not present

## 2020-04-03 DIAGNOSIS — R1312 Dysphagia, oropharyngeal phase: Secondary | ICD-10-CM | POA: Diagnosis not present

## 2020-04-04 DIAGNOSIS — M6281 Muscle weakness (generalized): Secondary | ICD-10-CM | POA: Diagnosis not present

## 2020-04-04 DIAGNOSIS — R29898 Other symptoms and signs involving the musculoskeletal system: Secondary | ICD-10-CM | POA: Diagnosis not present

## 2020-04-04 DIAGNOSIS — N3946 Mixed incontinence: Secondary | ICD-10-CM | POA: Diagnosis not present

## 2020-04-04 DIAGNOSIS — R1312 Dysphagia, oropharyngeal phase: Secondary | ICD-10-CM | POA: Diagnosis not present

## 2020-04-04 DIAGNOSIS — L989 Disorder of the skin and subcutaneous tissue, unspecified: Secondary | ICD-10-CM | POA: Diagnosis not present

## 2020-04-04 DIAGNOSIS — J42 Unspecified chronic bronchitis: Secondary | ICD-10-CM | POA: Diagnosis not present

## 2020-04-05 DIAGNOSIS — J42 Unspecified chronic bronchitis: Secondary | ICD-10-CM | POA: Diagnosis not present

## 2020-04-05 DIAGNOSIS — R29898 Other symptoms and signs involving the musculoskeletal system: Secondary | ICD-10-CM | POA: Diagnosis not present

## 2020-04-05 DIAGNOSIS — L989 Disorder of the skin and subcutaneous tissue, unspecified: Secondary | ICD-10-CM | POA: Diagnosis not present

## 2020-04-05 DIAGNOSIS — N3946 Mixed incontinence: Secondary | ICD-10-CM | POA: Diagnosis not present

## 2020-04-05 DIAGNOSIS — R1312 Dysphagia, oropharyngeal phase: Secondary | ICD-10-CM | POA: Diagnosis not present

## 2020-04-05 DIAGNOSIS — M6281 Muscle weakness (generalized): Secondary | ICD-10-CM | POA: Diagnosis not present

## 2020-04-06 DIAGNOSIS — L989 Disorder of the skin and subcutaneous tissue, unspecified: Secondary | ICD-10-CM | POA: Diagnosis not present

## 2020-04-06 DIAGNOSIS — M6281 Muscle weakness (generalized): Secondary | ICD-10-CM | POA: Diagnosis not present

## 2020-04-06 DIAGNOSIS — J42 Unspecified chronic bronchitis: Secondary | ICD-10-CM | POA: Diagnosis not present

## 2020-04-06 DIAGNOSIS — N3946 Mixed incontinence: Secondary | ICD-10-CM | POA: Diagnosis not present

## 2020-04-06 DIAGNOSIS — R1312 Dysphagia, oropharyngeal phase: Secondary | ICD-10-CM | POA: Diagnosis not present

## 2020-04-06 DIAGNOSIS — R29898 Other symptoms and signs involving the musculoskeletal system: Secondary | ICD-10-CM | POA: Diagnosis not present

## 2020-04-10 DIAGNOSIS — N3946 Mixed incontinence: Secondary | ICD-10-CM | POA: Diagnosis not present

## 2020-04-10 DIAGNOSIS — M6281 Muscle weakness (generalized): Secondary | ICD-10-CM | POA: Diagnosis not present

## 2020-04-10 DIAGNOSIS — R1312 Dysphagia, oropharyngeal phase: Secondary | ICD-10-CM | POA: Diagnosis not present

## 2020-04-10 DIAGNOSIS — L989 Disorder of the skin and subcutaneous tissue, unspecified: Secondary | ICD-10-CM | POA: Diagnosis not present

## 2020-04-10 DIAGNOSIS — R29898 Other symptoms and signs involving the musculoskeletal system: Secondary | ICD-10-CM | POA: Diagnosis not present

## 2020-04-10 DIAGNOSIS — J42 Unspecified chronic bronchitis: Secondary | ICD-10-CM | POA: Diagnosis not present

## 2020-04-12 DIAGNOSIS — N3946 Mixed incontinence: Secondary | ICD-10-CM | POA: Diagnosis not present

## 2020-04-12 DIAGNOSIS — R29898 Other symptoms and signs involving the musculoskeletal system: Secondary | ICD-10-CM | POA: Diagnosis not present

## 2020-04-12 DIAGNOSIS — L989 Disorder of the skin and subcutaneous tissue, unspecified: Secondary | ICD-10-CM | POA: Diagnosis not present

## 2020-04-12 DIAGNOSIS — J42 Unspecified chronic bronchitis: Secondary | ICD-10-CM | POA: Diagnosis not present

## 2020-04-12 DIAGNOSIS — R1312 Dysphagia, oropharyngeal phase: Secondary | ICD-10-CM | POA: Diagnosis not present

## 2020-04-12 DIAGNOSIS — M6281 Muscle weakness (generalized): Secondary | ICD-10-CM | POA: Diagnosis not present

## 2020-04-13 DIAGNOSIS — M6281 Muscle weakness (generalized): Secondary | ICD-10-CM | POA: Diagnosis not present

## 2020-04-13 DIAGNOSIS — L989 Disorder of the skin and subcutaneous tissue, unspecified: Secondary | ICD-10-CM | POA: Diagnosis not present

## 2020-04-13 DIAGNOSIS — N3946 Mixed incontinence: Secondary | ICD-10-CM | POA: Diagnosis not present

## 2020-04-13 DIAGNOSIS — J42 Unspecified chronic bronchitis: Secondary | ICD-10-CM | POA: Diagnosis not present

## 2020-04-13 DIAGNOSIS — R29898 Other symptoms and signs involving the musculoskeletal system: Secondary | ICD-10-CM | POA: Diagnosis not present

## 2020-04-13 DIAGNOSIS — R1312 Dysphagia, oropharyngeal phase: Secondary | ICD-10-CM | POA: Diagnosis not present

## 2020-04-14 DIAGNOSIS — M6281 Muscle weakness (generalized): Secondary | ICD-10-CM | POA: Diagnosis not present

## 2020-04-14 DIAGNOSIS — R29898 Other symptoms and signs involving the musculoskeletal system: Secondary | ICD-10-CM | POA: Diagnosis not present

## 2020-04-14 DIAGNOSIS — J42 Unspecified chronic bronchitis: Secondary | ICD-10-CM | POA: Diagnosis not present

## 2020-04-14 DIAGNOSIS — R1312 Dysphagia, oropharyngeal phase: Secondary | ICD-10-CM | POA: Diagnosis not present

## 2020-04-14 DIAGNOSIS — N3946 Mixed incontinence: Secondary | ICD-10-CM | POA: Diagnosis not present

## 2020-04-14 DIAGNOSIS — L989 Disorder of the skin and subcutaneous tissue, unspecified: Secondary | ICD-10-CM | POA: Diagnosis not present

## 2020-04-17 ENCOUNTER — Encounter: Payer: Self-pay | Admitting: Nurse Practitioner

## 2020-04-17 ENCOUNTER — Non-Acute Institutional Stay (SKILLED_NURSING_FACILITY): Payer: Medicare Other | Admitting: Nurse Practitioner

## 2020-04-17 DIAGNOSIS — L989 Disorder of the skin and subcutaneous tissue, unspecified: Secondary | ICD-10-CM | POA: Diagnosis not present

## 2020-04-17 DIAGNOSIS — F339 Major depressive disorder, recurrent, unspecified: Secondary | ICD-10-CM | POA: Diagnosis not present

## 2020-04-17 DIAGNOSIS — R29898 Other symptoms and signs involving the musculoskeletal system: Secondary | ICD-10-CM | POA: Diagnosis not present

## 2020-04-17 DIAGNOSIS — F0150 Vascular dementia without behavioral disturbance: Secondary | ICD-10-CM | POA: Diagnosis not present

## 2020-04-17 DIAGNOSIS — M1990 Unspecified osteoarthritis, unspecified site: Secondary | ICD-10-CM | POA: Diagnosis not present

## 2020-04-17 DIAGNOSIS — J42 Unspecified chronic bronchitis: Secondary | ICD-10-CM | POA: Diagnosis not present

## 2020-04-17 DIAGNOSIS — R000 Tachycardia, unspecified: Secondary | ICD-10-CM

## 2020-04-17 DIAGNOSIS — M6281 Muscle weakness (generalized): Secondary | ICD-10-CM | POA: Diagnosis not present

## 2020-04-17 DIAGNOSIS — R1312 Dysphagia, oropharyngeal phase: Secondary | ICD-10-CM | POA: Diagnosis not present

## 2020-04-17 DIAGNOSIS — K219 Gastro-esophageal reflux disease without esophagitis: Secondary | ICD-10-CM | POA: Diagnosis not present

## 2020-04-17 DIAGNOSIS — N3946 Mixed incontinence: Secondary | ICD-10-CM | POA: Diagnosis not present

## 2020-04-17 NOTE — Assessment & Plan Note (Signed)
GERD, stable, on Omeprazole 10mg  qd.

## 2020-04-17 NOTE — Assessment & Plan Note (Signed)
OA pain is controlled, on Tylenol 500mg qhs and prn. 

## 2020-04-17 NOTE — Assessment & Plan Note (Signed)
Her memory is preserved on Mamantine 10mg  qd.

## 2020-04-17 NOTE — Progress Notes (Signed)
Location:   [facility] Room Number: 42 Place of Service:  SNF (31) Provider:  Monque Haggar, Lennie Odor NP  [name], MD  Patient Care Team: [name], MD as PCP - General (Internal Medicine) [name], MD as Referring Physician (Ophthalmology) Alayiah Fontes X, NP as Nurse Practitioner (Internal Medicine)  Extended Emergency Contact Information Primary Emergency Contact: [name] Address: [address]          [place], [place] [address] [name] of [place] Phone: [phone] Mobile Phone: [phone] Relation: Son Secondary Emergency Contact: [name] Address: [address]          [place], [place] [address] [name] of [place] Phone: [phone] Mobile Phone: [phone] Relation: Son  Code Status:  DNR Goals of care: Advanced Directive information Advanced Directives 02/29/2020  Does Patient Have a Medical Advance Directive? Yes  Type of Paramedic of [place];Out of facility DNR (pink MOST or yellow form)  Does patient want to make changes to medical advance directive? No - Patient declined  Copy of [place] in Chart? -  Would patient like information on creating a medical advance directive? -  Pre-existing out of facility DNR order (yellow form or pink MOST form) Pink MOST form placed in chart (order not valid for inpatient use);Yellow form placed in chart (order not valid for inpatient use)     Chief Complaint  Patient presents with  . Medical Management of Chronic Issues    HPI:  Pt is a 86 y.o. female seen today for medical management of chronic diseases.    Sleeps/eats at her baseline, takesMirtazapine 7.5mg  qd.  Heart rate is in control, on Metoprolol 50mg  qd, 25mg  qd.  OA pain is controlled, on Tylenol 500mg  qhs and prn. Her memory is preserved on Mamantine 10mg  qd.  GERD, stable, on Omeprazole  10mg  qd. COPD/chronic bronchitis, takes Breo Ellipta qd     Past Medical History:  Diagnosis Date  . Anemia, iron deficiency    takes Ferrous Sulfate daily  . Depression    takes Effexor daily  . Gait disorder 04/21/2014  . GERD (gastroesophageal reflux disease)    takes Omeprazole daily  . Herpes ocular    history of-takes Acyclovir daily  . High cholesterol    takes Atorvastatin daily  . History of hiatal hernia   . Hypertension    takes Metoprolol daily  . Insomnia    takes Xanax nightly  . Osteoarthritis of knee    bilateral  . Sciatic pain    Past Surgical History:  Procedure Laterality Date  . ABDOMINAL HYSTERECTOMY  1995   partial  . CARPAL TUNNEL RELEASE Right 07/21/2007  . CARPAL TUNNEL RELEASE Left 09/24/2007  . COLONOSCOPY    . DESCEMETS STRIPPING AUTOMATED ENDOTHELIAL KERATOPLASTY Left 03/14/2011  . DESCEMETS STRIPPING AUTOMATED ENDOTHELIAL KERATOPLASTY Right 08/20/2012  . EYE SURGERY Bilateral    cataract surgery  . EYE SURGERY Left    corneal transplant  . HERNIA REPAIR    . HIP ARTHROPLASTY Left 06/13/2013   Procedure: ARTHROPLASTY BIPOLAR HIP; Injection left shoulder;  Surgeon: Johnny Bridge, MD;  Location: [facility];  Service: Orthopedics;  Laterality: Left;  . JOINT REPLACEMENT     bilateral knees, left knee  . KNEE ARTHROSCOPY Right 05/11/2001  . KYPHOPLASTY N/A 04/10/2015   Procedure: T11 Kyphoplasty;  Surgeon: Jovita Gamma, MD;  Location: [facility];  Service: Neurosurgery;  Laterality: N/A;  T11 Kyphoplasty  . KYPHOPLASTY  N/A 05/06/2015   Procedure: KYPHOPLASTY Thoracic twelve;  Surgeon: Jovita Gamma, MD;  Location: [facility];  Service: Neurosurgery;  Laterality: N/A;  T12 Kyphoplasty  . LAPAROSCOPIC NISSEN FUNDOPLICATION  9/60/4540  . LUMBAR LAMINECTOMY/DECOMPRESSION MICRODISCECTOMY  08/29/2010   L2-S1  . SHOULDER ARTHROSCOPY WITH ROTATOR CUFF REPAIR AND SUBACROMIAL DECOMPRESSION Right 01/01/2000  . TONSILLECTOMY     as child   . TOTAL HIP REVISION Left 07/08/2014   Procedure: TOTAL HIP REVISION;  Surgeon: Kerin Salen, MD;  Location: [facility];  Service: Orthopedics;  Laterality: Left;  . TOTAL KNEE ARTHROPLASTY Left 05/14/2004  . TOTAL KNEE ARTHROPLASTY  12/23/2011   Procedure: TOTAL KNEE ARTHROPLASTY;  Surgeon: Lorn Junes, MD;  Location: [facility];  Service: Orthopedics;  Laterality: Right;  DR [place] THIS CASE  . TRIGGER FINGER RELEASE Right 07/21/2007   thumb  . TRIGGER FINGER RELEASE Left 09/24/2007   middle finger  . TRIGGER FINGER RELEASE Right 03/10/2008   ring and little fingers  . TRIGGER FINGER RELEASE Right 09/02/2012   Procedure: RELEASE TRIGGER FINGER/A-1 PULLEY RIGHT INDEX FINGER;  Surgeon: Wynonia Sours, MD;  Location: [facility];  Service: Orthopedics;  Laterality: Right;  . TRIGGER FINGER RELEASE Left 12/22/2012   Procedure: RELEASE TRIGGER FINGER/A-1 PULLEY LEFT RING FINGER;  Surgeon: Wynonia Sours, MD;  Location: [facility];  Service: Orthopedics;  Laterality: Left;  Left     Allergies  Allergen Reactions  . Morphine And Related Other (See Comments)    AGITATION, STRANGE DREAMS  . Scopolamine Other (See Comments)    MENTAL CHANGES  . Atorvastatin Other (See Comments)    unknown  . Morphine Other (See Comments)    Disoriented, mood changes  . Nsaids Other (See Comments)    unknown  . Pravachol [Pravastatin Sodium] Other (See Comments)    unknown  . Pravastatin Other (See Comments)    unknown  . Teriparatide Other (See Comments)    unknown    Allergies as of 04/17/2020      Reactions   Morphine And Related Other (See Comments)   AGITATION, STRANGE DREAMS   Scopolamine Other (See Comments)   MENTAL CHANGES   Atorvastatin Other (See Comments)   unknown   Morphine Other (See Comments)   Disoriented, mood changes   Nsaids Other (See Comments)   unknown   Pravachol [pravastatin Sodium] Other (See Comments)   unknown   Pravastatin Other  (See Comments)   unknown   Teriparatide Other (See Comments)   unknown      Medication List       Accurate as of April 17, 2020 11:59 PM. If you have any questions, ask your nurse or doctor.        acetaminophen 500 MG tablet Commonly known as: TYLENOL Take 500 mg by mouth at bedtime.   Artificial Tears 0.2-0.2-1 % Soln Generic drug: Glycerin-Hypromellose-PEG 400 Place 1 drop into both eyes 4 (four) times daily.   aspirin EC 81 MG tablet Take 81 mg by mouth daily.   Breo Ellipta 100-25 MCG/INH Aepb Generic drug: fluticasone furoate-vilanterol Inhale 1 puff into the lungs daily.   cholecalciferol 25 MCG (1000 UNIT) tablet Commonly known as: VITAMIN D3 Take 1,000 Units by mouth daily.   magnesium hydroxide 400 MG/5ML suspension Commonly known as: MILK OF MAGNESIA Take 15 mLs by mouth at bedtime as needed for mild constipation.   memantine 10 MG tablet Commonly known as: NAMENDA Take 10  mg by mouth 2 (two) times daily.   metoprolol tartrate 25 MG tablet Commonly known as: LOPRESSOR Take 25 mg by mouth at bedtime.   metoprolol tartrate 50 MG tablet Commonly known as: LOPRESSOR Take 50 mg by mouth daily. DAILY 07:00 AM - 10:00 AM   mirtazapine 15 MG tablet Commonly known as: REMERON Take 7.5 mg by mouth at bedtime.   nitroGLYCERIN 0.4 MG SL tablet Commonly known as: NITROSTAT Place 0.4 mg under the tongue every 5 (five) minutes as needed for chest pain.   NON FORMULARY Special Instructions: Regular diet with thin liquids   omeprazole 10 MG capsule Commonly known as: PRILOSEC Take 10 mg by mouth daily.   Systane Nighttime Oint Apply to both eyes at bedtime       Review of Systems  Constitutional: Negative for activity change, fatigue, fever and unexpected weight change.  HENT: Positive for hearing loss. Negative for congestion and voice change.   Eyes: Negative for visual disturbance.  Respiratory: Negative for cough.   Cardiovascular: Negative  for leg swelling.  Gastrointestinal: Negative for abdominal pain and constipation.  Genitourinary: Negative for difficulty urinating, dysuria and urgency.  Musculoskeletal: Positive for arthralgias, back pain and gait problem.  Skin: Negative for color change.  Neurological: Negative for speech difficulty, weakness, light-headedness and headaches.       Dementia  Psychiatric/Behavioral: Positive for confusion. Negative for behavioral problems and sleep disturbance. The patient is not nervous/anxious.     Immunization History  Administered Date(s) Administered  . Influenza Whole 03/27/2018  . Influenza, High Dose Seasonal PF 03/26/2019  . Influenza-Unspecified 04/14/2017, 04/05/2020  . Moderna SARS-COVID-2 Vaccination 07/24/2019, 08/21/2019  . Pneumococcal Conjugate-13 09/24/2017  . Pneumococcal Polysaccharide-23 06/14/2013  . Td 09/25/2017  . Varicella 06/29/2003   Pertinent  Health Maintenance Due  Topic Date Due  . INFLUENZA VACCINE  Completed  . DEXA SCAN  Completed  . PNA vac Low Risk Adult  Completed   Fall Risk  09/19/2017  Falls in the past year? No   Functional Status Survey:    Vitals:   04/17/20 1009  BP: 120/74  Pulse: 74  Resp: 18  Temp: 98.2 F (36.8 C)  SpO2: 94%  Weight: 111 lb 14.4 oz (50.8 kg)  Height: 5\' 1"  (1.549 m)   Body mass index is 21.14 kg/m. Physical Exam Vitals and nursing note reviewed.  Constitutional:      Appearance: Normal appearance.  HENT:     Head: Normocephalic and atraumatic.     Mouth/Throat:     Mouth: Mucous membranes are moist.  Eyes:     Extraocular Movements: Extraocular movements intact.     Conjunctiva/sclera: Conjunctivae normal.     Pupils: Pupils are equal, round, and reactive to light.  Cardiovascular:     Rate and Rhythm: Normal rate and regular rhythm.     Heart sounds: No murmur heard.   Pulmonary:     Breath sounds: No wheezing or rales.  Abdominal:     Palpations: Abdomen is soft.     Tenderness:  There is no abdominal tenderness.  Musculoskeletal:     Cervical back: Normal range of motion.     Right lower leg: No edema.     Left lower leg: No edema.  Skin:    General: Skin is warm and dry.  Neurological:     General: No focal deficit present.     Mental Status: She is alert. Mental status is at baseline.     Gait:  Gait abnormal.     Comments: Oriented to self.   Psychiatric:        Mood and Affect: Mood normal.        Behavior: Behavior normal.     Labs reviewed: Recent Labs    07/05/19 0000 08/03/19 0000 10/14/19 0000  NA 134* 139 139  K 4.5 4.4 4.9  CL 103 108 105  CO2 23* 26* 28*  BUN 11 14 19   CREATININE 0.6 0.6 0.7  CALCIUM 8.5* 8.4* 9.2   Recent Labs    06/29/19 0000 08/03/19 0000 10/14/19 0000  AST 15 22 26   ALT 18 19 23   ALKPHOS 96 74 66  ALBUMIN 3.3* 2.8* 3.6   Recent Labs    08/03/19 0000 10/14/19 0000 11/25/19 0000  WBC 5.3 5.0 5.0  NEUTROABS 1,882 2,040 1,545  HGB 11.2* 11.9* 11.6*  HCT 33* 37 35*  PLT 303 270 228   Lab Results  Component Value Date   TSH 2.68 10/14/2019   Lab Results  Component Value Date   HGBA1C 5.8 06/29/2013   No results found for: CHOL, HDL, LDLCALC, LDLDIRECT, TRIG, CHOLHDL  Significant Diagnostic Results in last 30 days:  No results found.  Assessment/Plan Chronic bronchitis (HCC) COPD/chronic bronchitis, takes Breo Ellipta qd   GERD (gastroesophageal reflux disease) GERD, stable, on Omeprazole 10mg  qd.  Vascular dementia ([place]) Her memory is preserved on Mamantine 10mg  qd.    Osteoarthritis OA pain is controlled, on Tylenol 500mg  qhs and prn.  Tachycardia Heart rate is in control, on Metoprolol 50mg  qd, 25mg  qd.    Depression, recurrent ([place]) Sleeps/eats at her baseline, takesMirtazapine 7.5mg  qd.       Family/ staff Communication: plan of care reviewed with the patient and charge nurse.   Labs/tests ordered:  CBC/diff, CMP/eGFR, TSH   Time spend 35 minutes.

## 2020-04-17 NOTE — Assessment & Plan Note (Signed)
Sleeps/eats at her baseline, takesMirtazapine 7.5mg qd.   

## 2020-04-17 NOTE — Assessment & Plan Note (Signed)
Heart rate is in control, on Metoprolol 50mg qd, 25mg qd.  

## 2020-04-17 NOTE — Assessment & Plan Note (Signed)
COPD/chronic bronchitis, takes Breo Ellipta qd

## 2020-04-18 ENCOUNTER — Encounter: Payer: Self-pay | Admitting: Nurse Practitioner

## 2020-04-19 DIAGNOSIS — M6281 Muscle weakness (generalized): Secondary | ICD-10-CM | POA: Diagnosis not present

## 2020-04-19 DIAGNOSIS — J42 Unspecified chronic bronchitis: Secondary | ICD-10-CM | POA: Diagnosis not present

## 2020-04-19 DIAGNOSIS — R29898 Other symptoms and signs involving the musculoskeletal system: Secondary | ICD-10-CM | POA: Diagnosis not present

## 2020-04-19 DIAGNOSIS — R1312 Dysphagia, oropharyngeal phase: Secondary | ICD-10-CM | POA: Diagnosis not present

## 2020-04-19 DIAGNOSIS — L989 Disorder of the skin and subcutaneous tissue, unspecified: Secondary | ICD-10-CM | POA: Diagnosis not present

## 2020-04-19 DIAGNOSIS — N3946 Mixed incontinence: Secondary | ICD-10-CM | POA: Diagnosis not present

## 2020-04-20 DIAGNOSIS — R1312 Dysphagia, oropharyngeal phase: Secondary | ICD-10-CM | POA: Diagnosis not present

## 2020-04-20 DIAGNOSIS — N3946 Mixed incontinence: Secondary | ICD-10-CM | POA: Diagnosis not present

## 2020-04-20 DIAGNOSIS — R29898 Other symptoms and signs involving the musculoskeletal system: Secondary | ICD-10-CM | POA: Diagnosis not present

## 2020-04-20 DIAGNOSIS — M6281 Muscle weakness (generalized): Secondary | ICD-10-CM | POA: Diagnosis not present

## 2020-04-20 DIAGNOSIS — J42 Unspecified chronic bronchitis: Secondary | ICD-10-CM | POA: Diagnosis not present

## 2020-04-20 DIAGNOSIS — L989 Disorder of the skin and subcutaneous tissue, unspecified: Secondary | ICD-10-CM | POA: Diagnosis not present

## 2020-04-21 DIAGNOSIS — R29898 Other symptoms and signs involving the musculoskeletal system: Secondary | ICD-10-CM | POA: Diagnosis not present

## 2020-04-21 DIAGNOSIS — M6281 Muscle weakness (generalized): Secondary | ICD-10-CM | POA: Diagnosis not present

## 2020-04-21 DIAGNOSIS — N3946 Mixed incontinence: Secondary | ICD-10-CM | POA: Diagnosis not present

## 2020-04-21 DIAGNOSIS — J42 Unspecified chronic bronchitis: Secondary | ICD-10-CM | POA: Diagnosis not present

## 2020-04-21 DIAGNOSIS — L989 Disorder of the skin and subcutaneous tissue, unspecified: Secondary | ICD-10-CM | POA: Diagnosis not present

## 2020-04-21 DIAGNOSIS — R1312 Dysphagia, oropharyngeal phase: Secondary | ICD-10-CM | POA: Diagnosis not present

## 2020-04-25 DIAGNOSIS — R29898 Other symptoms and signs involving the musculoskeletal system: Secondary | ICD-10-CM | POA: Diagnosis not present

## 2020-04-25 DIAGNOSIS — F329 Major depressive disorder, single episode, unspecified: Secondary | ICD-10-CM | POA: Diagnosis not present

## 2020-04-25 DIAGNOSIS — R1312 Dysphagia, oropharyngeal phase: Secondary | ICD-10-CM | POA: Diagnosis not present

## 2020-04-25 DIAGNOSIS — N3946 Mixed incontinence: Secondary | ICD-10-CM | POA: Diagnosis not present

## 2020-04-25 DIAGNOSIS — K219 Gastro-esophageal reflux disease without esophagitis: Secondary | ICD-10-CM | POA: Diagnosis not present

## 2020-04-25 DIAGNOSIS — R000 Tachycardia, unspecified: Secondary | ICD-10-CM | POA: Diagnosis not present

## 2020-04-25 DIAGNOSIS — F0150 Vascular dementia without behavioral disturbance: Secondary | ICD-10-CM | POA: Diagnosis not present

## 2020-04-25 DIAGNOSIS — D509 Iron deficiency anemia, unspecified: Secondary | ICD-10-CM | POA: Diagnosis not present

## 2020-04-25 DIAGNOSIS — G548 Other nerve root and plexus disorders: Secondary | ICD-10-CM | POA: Diagnosis not present

## 2020-04-25 DIAGNOSIS — R296 Repeated falls: Secondary | ICD-10-CM | POA: Diagnosis not present

## 2020-04-25 DIAGNOSIS — R300 Dysuria: Secondary | ICD-10-CM | POA: Diagnosis not present

## 2020-04-25 DIAGNOSIS — Z20822 Contact with and (suspected) exposure to covid-19: Secondary | ICD-10-CM | POA: Diagnosis not present

## 2020-04-25 DIAGNOSIS — R079 Chest pain, unspecified: Secondary | ICD-10-CM | POA: Diagnosis not present

## 2020-04-25 DIAGNOSIS — M6281 Muscle weakness (generalized): Secondary | ICD-10-CM | POA: Diagnosis not present

## 2020-04-25 DIAGNOSIS — Z9181 History of falling: Secondary | ICD-10-CM | POA: Diagnosis not present

## 2020-04-26 DIAGNOSIS — M6281 Muscle weakness (generalized): Secondary | ICD-10-CM | POA: Diagnosis not present

## 2020-04-26 DIAGNOSIS — R296 Repeated falls: Secondary | ICD-10-CM | POA: Diagnosis not present

## 2020-04-26 DIAGNOSIS — R1312 Dysphagia, oropharyngeal phase: Secondary | ICD-10-CM | POA: Diagnosis not present

## 2020-04-26 DIAGNOSIS — N3946 Mixed incontinence: Secondary | ICD-10-CM | POA: Diagnosis not present

## 2020-04-26 DIAGNOSIS — R29898 Other symptoms and signs involving the musculoskeletal system: Secondary | ICD-10-CM | POA: Diagnosis not present

## 2020-04-26 DIAGNOSIS — R000 Tachycardia, unspecified: Secondary | ICD-10-CM | POA: Diagnosis not present

## 2020-04-27 DIAGNOSIS — R000 Tachycardia, unspecified: Secondary | ICD-10-CM | POA: Diagnosis not present

## 2020-04-27 DIAGNOSIS — R29898 Other symptoms and signs involving the musculoskeletal system: Secondary | ICD-10-CM | POA: Diagnosis not present

## 2020-04-27 DIAGNOSIS — R296 Repeated falls: Secondary | ICD-10-CM | POA: Diagnosis not present

## 2020-04-27 DIAGNOSIS — N3946 Mixed incontinence: Secondary | ICD-10-CM | POA: Diagnosis not present

## 2020-04-27 DIAGNOSIS — M6281 Muscle weakness (generalized): Secondary | ICD-10-CM | POA: Diagnosis not present

## 2020-04-27 DIAGNOSIS — R1312 Dysphagia, oropharyngeal phase: Secondary | ICD-10-CM | POA: Diagnosis not present

## 2020-04-28 DIAGNOSIS — N3946 Mixed incontinence: Secondary | ICD-10-CM | POA: Diagnosis not present

## 2020-04-28 DIAGNOSIS — R29898 Other symptoms and signs involving the musculoskeletal system: Secondary | ICD-10-CM | POA: Diagnosis not present

## 2020-04-28 DIAGNOSIS — R296 Repeated falls: Secondary | ICD-10-CM | POA: Diagnosis not present

## 2020-04-28 DIAGNOSIS — R1312 Dysphagia, oropharyngeal phase: Secondary | ICD-10-CM | POA: Diagnosis not present

## 2020-04-28 DIAGNOSIS — M6281 Muscle weakness (generalized): Secondary | ICD-10-CM | POA: Diagnosis not present

## 2020-04-28 DIAGNOSIS — R000 Tachycardia, unspecified: Secondary | ICD-10-CM | POA: Diagnosis not present

## 2020-05-01 DIAGNOSIS — M6281 Muscle weakness (generalized): Secondary | ICD-10-CM | POA: Diagnosis not present

## 2020-05-01 DIAGNOSIS — N3946 Mixed incontinence: Secondary | ICD-10-CM | POA: Diagnosis not present

## 2020-05-01 DIAGNOSIS — R29898 Other symptoms and signs involving the musculoskeletal system: Secondary | ICD-10-CM | POA: Diagnosis not present

## 2020-05-01 DIAGNOSIS — R296 Repeated falls: Secondary | ICD-10-CM | POA: Diagnosis not present

## 2020-05-01 DIAGNOSIS — R000 Tachycardia, unspecified: Secondary | ICD-10-CM | POA: Diagnosis not present

## 2020-05-01 DIAGNOSIS — R1312 Dysphagia, oropharyngeal phase: Secondary | ICD-10-CM | POA: Diagnosis not present

## 2020-05-02 DIAGNOSIS — N3946 Mixed incontinence: Secondary | ICD-10-CM | POA: Diagnosis not present

## 2020-05-02 DIAGNOSIS — R1312 Dysphagia, oropharyngeal phase: Secondary | ICD-10-CM | POA: Diagnosis not present

## 2020-05-02 DIAGNOSIS — R296 Repeated falls: Secondary | ICD-10-CM | POA: Diagnosis not present

## 2020-05-02 DIAGNOSIS — R29898 Other symptoms and signs involving the musculoskeletal system: Secondary | ICD-10-CM | POA: Diagnosis not present

## 2020-05-02 DIAGNOSIS — R000 Tachycardia, unspecified: Secondary | ICD-10-CM | POA: Diagnosis not present

## 2020-05-02 DIAGNOSIS — M6281 Muscle weakness (generalized): Secondary | ICD-10-CM | POA: Diagnosis not present

## 2020-05-03 DIAGNOSIS — R1312 Dysphagia, oropharyngeal phase: Secondary | ICD-10-CM | POA: Diagnosis not present

## 2020-05-03 DIAGNOSIS — N3946 Mixed incontinence: Secondary | ICD-10-CM | POA: Diagnosis not present

## 2020-05-03 DIAGNOSIS — R29898 Other symptoms and signs involving the musculoskeletal system: Secondary | ICD-10-CM | POA: Diagnosis not present

## 2020-05-03 DIAGNOSIS — R000 Tachycardia, unspecified: Secondary | ICD-10-CM | POA: Diagnosis not present

## 2020-05-03 DIAGNOSIS — R296 Repeated falls: Secondary | ICD-10-CM | POA: Diagnosis not present

## 2020-05-03 DIAGNOSIS — M6281 Muscle weakness (generalized): Secondary | ICD-10-CM | POA: Diagnosis not present

## 2020-05-04 DIAGNOSIS — R000 Tachycardia, unspecified: Secondary | ICD-10-CM | POA: Diagnosis not present

## 2020-05-04 DIAGNOSIS — R1312 Dysphagia, oropharyngeal phase: Secondary | ICD-10-CM | POA: Diagnosis not present

## 2020-05-04 DIAGNOSIS — N3946 Mixed incontinence: Secondary | ICD-10-CM | POA: Diagnosis not present

## 2020-05-04 DIAGNOSIS — R29898 Other symptoms and signs involving the musculoskeletal system: Secondary | ICD-10-CM | POA: Diagnosis not present

## 2020-05-04 DIAGNOSIS — R296 Repeated falls: Secondary | ICD-10-CM | POA: Diagnosis not present

## 2020-05-04 DIAGNOSIS — M6281 Muscle weakness (generalized): Secondary | ICD-10-CM | POA: Diagnosis not present

## 2020-05-05 DIAGNOSIS — R29898 Other symptoms and signs involving the musculoskeletal system: Secondary | ICD-10-CM | POA: Diagnosis not present

## 2020-05-05 DIAGNOSIS — M6281 Muscle weakness (generalized): Secondary | ICD-10-CM | POA: Diagnosis not present

## 2020-05-05 DIAGNOSIS — R000 Tachycardia, unspecified: Secondary | ICD-10-CM | POA: Diagnosis not present

## 2020-05-05 DIAGNOSIS — R1312 Dysphagia, oropharyngeal phase: Secondary | ICD-10-CM | POA: Diagnosis not present

## 2020-05-05 DIAGNOSIS — N3946 Mixed incontinence: Secondary | ICD-10-CM | POA: Diagnosis not present

## 2020-05-05 DIAGNOSIS — R296 Repeated falls: Secondary | ICD-10-CM | POA: Diagnosis not present

## 2020-05-08 DIAGNOSIS — R000 Tachycardia, unspecified: Secondary | ICD-10-CM | POA: Diagnosis not present

## 2020-05-08 DIAGNOSIS — R1312 Dysphagia, oropharyngeal phase: Secondary | ICD-10-CM | POA: Diagnosis not present

## 2020-05-08 DIAGNOSIS — M6281 Muscle weakness (generalized): Secondary | ICD-10-CM | POA: Diagnosis not present

## 2020-05-08 DIAGNOSIS — N3946 Mixed incontinence: Secondary | ICD-10-CM | POA: Diagnosis not present

## 2020-05-08 DIAGNOSIS — R29898 Other symptoms and signs involving the musculoskeletal system: Secondary | ICD-10-CM | POA: Diagnosis not present

## 2020-05-08 DIAGNOSIS — R296 Repeated falls: Secondary | ICD-10-CM | POA: Diagnosis not present

## 2020-05-10 DIAGNOSIS — R296 Repeated falls: Secondary | ICD-10-CM | POA: Diagnosis not present

## 2020-05-10 DIAGNOSIS — R29898 Other symptoms and signs involving the musculoskeletal system: Secondary | ICD-10-CM | POA: Diagnosis not present

## 2020-05-10 DIAGNOSIS — R1312 Dysphagia, oropharyngeal phase: Secondary | ICD-10-CM | POA: Diagnosis not present

## 2020-05-10 DIAGNOSIS — N3946 Mixed incontinence: Secondary | ICD-10-CM | POA: Diagnosis not present

## 2020-05-10 DIAGNOSIS — R000 Tachycardia, unspecified: Secondary | ICD-10-CM | POA: Diagnosis not present

## 2020-05-10 DIAGNOSIS — M6281 Muscle weakness (generalized): Secondary | ICD-10-CM | POA: Diagnosis not present

## 2020-05-11 ENCOUNTER — Non-Acute Institutional Stay (SKILLED_NURSING_FACILITY): Payer: Medicare Other | Admitting: Nurse Practitioner

## 2020-05-11 ENCOUNTER — Encounter: Payer: Self-pay | Admitting: Nurse Practitioner

## 2020-05-11 DIAGNOSIS — K219 Gastro-esophageal reflux disease without esophagitis: Secondary | ICD-10-CM

## 2020-05-11 DIAGNOSIS — M1990 Unspecified osteoarthritis, unspecified site: Secondary | ICD-10-CM

## 2020-05-11 DIAGNOSIS — J42 Unspecified chronic bronchitis: Secondary | ICD-10-CM | POA: Diagnosis not present

## 2020-05-11 DIAGNOSIS — R000 Tachycardia, unspecified: Secondary | ICD-10-CM

## 2020-05-11 DIAGNOSIS — F339 Major depressive disorder, recurrent, unspecified: Secondary | ICD-10-CM | POA: Diagnosis not present

## 2020-05-11 DIAGNOSIS — F0150 Vascular dementia without behavioral disturbance: Secondary | ICD-10-CM | POA: Diagnosis not present

## 2020-05-11 NOTE — Assessment & Plan Note (Signed)
OA pain is controlled, on Tylenol 500mg qhs and prn. 

## 2020-05-11 NOTE — Assessment & Plan Note (Signed)
Sleeps/eats at her baseline, takesMirtazapine 7.5mg qd.   

## 2020-05-11 NOTE — Assessment & Plan Note (Signed)
Her memory is preserved on Mamantine 10mg  qd.

## 2020-05-11 NOTE — Assessment & Plan Note (Signed)
Heart rate is in control, on Metoprolol 50mg qd, 25mg qd.  

## 2020-05-11 NOTE — Assessment & Plan Note (Signed)
COPD/chronic bronchitis, takes Breo Ellipta qd

## 2020-05-11 NOTE — Assessment & Plan Note (Signed)
GERD, stable, on Omeprazole 10mg  qd.

## 2020-05-11 NOTE — Progress Notes (Signed)
Location:   [facility] Room Number: [place] of Service:  SNF [phone]) Provider:  Marlana Latus NP  [name], MD  Patient Care Team: [name], MD as PCP - General (Internal Medicine) [name], MD as Referring Physician (Ophthalmology) Malaquias Lenker X, NP as Nurse Practitioner (Internal Medicine)  Extended Emergency Contact Information Primary Emergency Contact: [name] Address: [address]          [place], [place] [address] [name] of [place] Phone: [phone] Mobile Phone: [phone] Relation: Son Secondary Emergency Contact: [name] Address: [address]          [place], [place] [address] [name] of [place] Phone: [phone] Mobile Phone: [phone] Relation: Son  Code Status:  DNR Goals of care: Advanced Directive information Advanced Directives 05/11/2020  Does Patient Have a Medical Advance Directive? Yes  Type of Advance Directive Out of facility DNR (pink MOST or yellow form);Healthcare Power of Attorney  Does patient want to make changes to medical advance directive? No - Patient declined  Copy of [place] in Chart? Yes - validated most recent copy scanned in chart (See row information)  Would patient like information on creating a medical advance directive? -  Pre-existing out of facility DNR order (yellow form or pink MOST form) Pink MOST form placed in chart (order not valid for inpatient use);Yellow form placed in chart (order not valid for inpatient use)     Chief Complaint  Patient presents with  . Medical Management of Chronic Issues    HPI:  Pt is a 86 y.o. female seen today for medical management of chronic diseases.    Sleeps/eats at her baseline, takesMirtazapine 7.5mg  qd.  Heart rate is in control, on Metoprolol 50mg  qd, 25mg  qd.  OA pain is controlled, on Tylenol 500mg  qhs and prn. Her memory is  preserved on Mamantine 10mg  qd. No behavioral issues, self propels w/c to get around.  GERD, stable, on Omeprazole 10mg  qd. COPD/chronic bronchitis, takes Breo Ellipta qd                Past Medical History:  Diagnosis Date  . Anemia, iron deficiency    takes Ferrous Sulfate daily  . Depression    takes Effexor daily  . Gait disorder 04/21/2014  . GERD (gastroesophageal reflux disease)    takes Omeprazole daily  . Herpes ocular    history of-takes Acyclovir daily  . High cholesterol    takes Atorvastatin daily  . History of hiatal hernia   . Hypertension    takes Metoprolol daily  . Insomnia    takes Xanax nightly  . Osteoarthritis of knee    bilateral  . Sciatic pain    Past Surgical History:  Procedure Laterality Date  . ABDOMINAL HYSTERECTOMY  1995   partial  . CARPAL TUNNEL RELEASE Right 07/21/2007  . CARPAL TUNNEL RELEASE Left 09/24/2007  . COLONOSCOPY    . DESCEMETS STRIPPING AUTOMATED ENDOTHELIAL KERATOPLASTY Left 03/14/2011  . DESCEMETS STRIPPING AUTOMATED ENDOTHELIAL KERATOPLASTY Right 08/20/2012  . EYE SURGERY Bilateral    cataract surgery  . EYE SURGERY Left    corneal transplant  . HERNIA REPAIR    . HIP ARTHROPLASTY Left 06/13/2013   Procedure: ARTHROPLASTY BIPOLAR HIP; Injection left shoulder;  Surgeon: Johnny Bridge, MD;  Location: [facility];  Service: Orthopedics;  Laterality: Left;  . JOINT REPLACEMENT     bilateral knees, left knee  . KNEE ARTHROSCOPY Right 05/11/2001  .  KYPHOPLASTY N/A 04/10/2015   Procedure: T11 Kyphoplasty;  Surgeon: Jovita Gamma, MD;  Location: [facility];  Service: Neurosurgery;  Laterality: N/A;  T11 Kyphoplasty  . KYPHOPLASTY N/A 05/06/2015   Procedure: KYPHOPLASTY Thoracic twelve;  Surgeon: Jovita Gamma, MD;  Location: [facility];  Service: Neurosurgery;  Laterality: N/A;  T12 Kyphoplasty  . LAPAROSCOPIC NISSEN FUNDOPLICATION  3/50/0938  . LUMBAR LAMINECTOMY/DECOMPRESSION MICRODISCECTOMY   08/29/2010   L2-S1  . SHOULDER ARTHROSCOPY WITH ROTATOR CUFF REPAIR AND SUBACROMIAL DECOMPRESSION Right 01/01/2000  . TONSILLECTOMY     as child  . TOTAL HIP REVISION Left 07/08/2014   Procedure: TOTAL HIP REVISION;  Surgeon: Kerin Salen, MD;  Location: [facility];  Service: Orthopedics;  Laterality: Left;  . TOTAL KNEE ARTHROPLASTY Left 05/14/2004  . TOTAL KNEE ARTHROPLASTY  12/23/2011   Procedure: TOTAL KNEE ARTHROPLASTY;  Surgeon: Lorn Junes, MD;  Location: [facility];  Service: Orthopedics;  Laterality: Right;  DR [place] THIS CASE  . TRIGGER FINGER RELEASE Right 07/21/2007   thumb  . TRIGGER FINGER RELEASE Left 09/24/2007   middle finger  . TRIGGER FINGER RELEASE Right 03/10/2008   ring and little fingers  . TRIGGER FINGER RELEASE Right 09/02/2012   Procedure: RELEASE TRIGGER FINGER/A-1 PULLEY RIGHT INDEX FINGER;  Surgeon: Wynonia Sours, MD;  Location: [facility];  Service: Orthopedics;  Laterality: Right;  . TRIGGER FINGER RELEASE Left 12/22/2012   Procedure: RELEASE TRIGGER FINGER/A-1 PULLEY LEFT RING FINGER;  Surgeon: Wynonia Sours, MD;  Location: [facility];  Service: Orthopedics;  Laterality: Left;  Left     Allergies  Allergen Reactions  . Morphine And Related Other (See Comments)    AGITATION, STRANGE DREAMS  . Scopolamine Other (See Comments)    MENTAL CHANGES  . Atorvastatin Other (See Comments)    unknown  . Morphine Other (See Comments)    Disoriented, mood changes  . Nsaids Other (See Comments)    unknown  . Pravachol [Pravastatin Sodium] Other (See Comments)    unknown  . Pravastatin Other (See Comments)    unknown  . Teriparatide Other (See Comments)    unknown    Allergies as of 05/11/2020      Reactions   Morphine And Related Other (See Comments)   AGITATION, STRANGE DREAMS   Scopolamine Other (See Comments)   MENTAL CHANGES   Atorvastatin Other (See Comments)   unknown   Morphine Other (See Comments)    Disoriented, mood changes   Nsaids Other (See Comments)   unknown   Pravachol [pravastatin Sodium] Other (See Comments)   unknown   Pravastatin Other (See Comments)   unknown   Teriparatide Other (See Comments)   unknown      Medication List       Accurate as of May 11, 2020 11:59 PM. If you have any questions, ask your nurse or doctor.        acetaminophen 500 MG tablet Commonly known as: TYLENOL Take 500 mg by mouth at bedtime.   Artificial Tears 0.2-0.2-1 % Soln Generic drug: Glycerin-Hypromellose-PEG 400 Place 1 drop into both eyes 4 (four) times daily.   aspirin EC 81 MG tablet Take 81 mg by mouth daily.   Breo Ellipta 100-25 MCG/INH Aepb Generic drug: fluticasone furoate-vilanterol Inhale 1 puff into the lungs daily.   cholecalciferol 25 MCG (1000 UNIT) tablet Commonly known as: VITAMIN D3 Take 1,000 Units by mouth daily.   magnesium hydroxide 400 MG/5ML suspension  Commonly known as: MILK OF MAGNESIA Take 15 mLs by mouth at bedtime as needed for mild constipation.   memantine 10 MG tablet Commonly known as: NAMENDA Take 10 mg by mouth 2 (two) times daily.   metoprolol tartrate 25 MG tablet Commonly known as: LOPRESSOR Take 25 mg by mouth at bedtime.   metoprolol tartrate 50 MG tablet Commonly known as: LOPRESSOR Take 50 mg by mouth daily. DAILY 07:00 AM - 10:00 AM   mirtazapine 15 MG tablet Commonly known as: REMERON Take 7.5 mg by mouth at bedtime.   nitroGLYCERIN 0.4 MG SL tablet Commonly known as: NITROSTAT Place 0.4 mg under the tongue every 5 (five) minutes as needed for chest pain.   NON FORMULARY Special Instructions: Regular diet with thin liquids   omeprazole 10 MG capsule Commonly known as: PRILOSEC Take 10 mg by mouth daily.   Systane Nighttime Oint Apply to both eyes at bedtime       Review of Systems  Constitutional: Negative for fatigue, fever and unexpected weight change.       Gradually has gained a pound to two in  the past 2-3 months.   HENT: Positive for hearing loss. Negative for congestion and voice change.   Eyes: Negative for visual disturbance.  Respiratory: Negative for cough.   Cardiovascular: Negative for leg swelling.  Gastrointestinal: Negative for abdominal pain and constipation.  Genitourinary: Negative for difficulty urinating, dysuria and urgency.  Musculoskeletal: Positive for arthralgias, back pain and gait problem.  Skin: Negative for color change.  Neurological: Negative for speech difficulty, weakness, light-headedness and headaches.       Dementia  Psychiatric/Behavioral: Positive for confusion. Negative for behavioral problems and sleep disturbance. The patient is not nervous/anxious.     Immunization History  Administered Date(s) Administered  . Influenza Whole 03/27/2018  . Influenza, High Dose Seasonal PF 03/26/2019  . Influenza-Unspecified 04/14/2017, 04/05/2020  . Moderna SARS-COVID-2 Vaccination 07/24/2019, 08/21/2019  . Pneumococcal Conjugate-13 09/24/2017  . Pneumococcal Polysaccharide-23 06/14/2013  . Td 09/25/2017  . Varicella 06/29/2003   Pertinent  Health Maintenance Due  Topic Date Due  . INFLUENZA VACCINE  Completed  . DEXA SCAN  Completed  . PNA vac Low Risk Adult  Completed   Fall Risk  09/19/2017  Falls in the past year? No   Functional Status Survey:    Vitals:   05/11/20 0944  BP: 120/86  Pulse: 86  Resp: 18  Temp: 97.8 F (36.6 C)  SpO2: 92%  Weight: 112 lb 11.2 oz (51.1 kg)  Height: 5\' 1"  (1.549 m)   Body mass index is 21.29 kg/m. Physical Exam Vitals and nursing note reviewed.  Constitutional:      Appearance: Normal appearance.  HENT:     Head: Normocephalic and atraumatic.     Mouth/Throat:     Mouth: Mucous membranes are moist.  Eyes:     Extraocular Movements: Extraocular movements intact.     Conjunctiva/sclera: Conjunctivae normal.     Pupils: Pupils are equal, round, and reactive to light.  Cardiovascular:     Rate  and Rhythm: Normal rate and regular rhythm.     Heart sounds: No murmur heard.   Pulmonary:     Breath sounds: No wheezing or rales.  Abdominal:     Palpations: Abdomen is soft.     Tenderness: There is no abdominal tenderness.  Musculoskeletal:     Cervical back: Normal range of motion.     Right lower leg: No edema.  Left lower leg: No edema.  Skin:    General: Skin is warm and dry.  Neurological:     General: No focal deficit present.     Mental Status: She is alert. Mental status is at baseline.     Gait: Gait abnormal.     Comments: Oriented to self.   Psychiatric:        Mood and Affect: Mood normal.        Behavior: Behavior normal.     Labs reviewed: Recent Labs    07/05/19 0000 08/03/19 0000 10/14/19 0000  NA 134* 139 139  K 4.5 4.4 4.9  CL 103 108 105  CO2 23* 26* 28*  BUN 11 14 19   CREATININE 0.6 0.6 0.7  CALCIUM 8.5* 8.4* 9.2   Recent Labs    06/29/19 0000 08/03/19 0000 10/14/19 0000  AST 15 22 26   ALT 18 19 23   ALKPHOS 96 74 66  ALBUMIN 3.3* 2.8* 3.6   Recent Labs    08/03/19 0000 10/14/19 0000 11/25/19 0000  WBC 5.3 5.0 5.0  NEUTROABS 1,882 2,040 1,545  HGB 11.2* 11.9* 11.6*  HCT 33* 37 35*  PLT 303 270 228   Lab Results  Component Value Date   TSH 2.68 10/14/2019   Lab Results  Component Value Date   HGBA1C 5.8 06/29/2013   No results found for: CHOL, HDL, LDLCALC, LDLDIRECT, TRIG, CHOLHDL  Significant Diagnostic Results in last 30 days:  No results found.  Assessment/Plan Chronic bronchitis (HCC) COPD/chronic bronchitis, takes Breo Ellipta qd  GERD (gastroesophageal reflux disease) GERD, stable, on Omeprazole 10mg  qd.  Vascular dementia ([place]) Her memory is preserved on Mamantine 10mg  qd.    Osteoarthritis OA pain is controlled, on Tylenol 500mg  qhs and prn.   Tachycardia Heart rate is in control, on Metoprolol 50mg  qd, 25mg  qd.   Depression, recurrent ([place]) Sleeps/eats at her baseline,  takesMirtazapine 7.5mg  qd.     Family/ staff Communication: plan of care reviewed with the patient and charge nurse.   Labs/tests ordered:  none  Time spend 35 minutes.

## 2020-05-12 ENCOUNTER — Encounter: Payer: Self-pay | Admitting: Nurse Practitioner

## 2020-05-16 DIAGNOSIS — R29898 Other symptoms and signs involving the musculoskeletal system: Secondary | ICD-10-CM | POA: Diagnosis not present

## 2020-05-16 DIAGNOSIS — M6281 Muscle weakness (generalized): Secondary | ICD-10-CM | POA: Diagnosis not present

## 2020-05-16 DIAGNOSIS — N3946 Mixed incontinence: Secondary | ICD-10-CM | POA: Diagnosis not present

## 2020-05-16 DIAGNOSIS — R000 Tachycardia, unspecified: Secondary | ICD-10-CM | POA: Diagnosis not present

## 2020-05-16 DIAGNOSIS — R296 Repeated falls: Secondary | ICD-10-CM | POA: Diagnosis not present

## 2020-05-16 DIAGNOSIS — R1312 Dysphagia, oropharyngeal phase: Secondary | ICD-10-CM | POA: Diagnosis not present

## 2020-05-17 DIAGNOSIS — R000 Tachycardia, unspecified: Secondary | ICD-10-CM | POA: Diagnosis not present

## 2020-05-17 DIAGNOSIS — R29898 Other symptoms and signs involving the musculoskeletal system: Secondary | ICD-10-CM | POA: Diagnosis not present

## 2020-05-17 DIAGNOSIS — M6281 Muscle weakness (generalized): Secondary | ICD-10-CM | POA: Diagnosis not present

## 2020-05-17 DIAGNOSIS — R1312 Dysphagia, oropharyngeal phase: Secondary | ICD-10-CM | POA: Diagnosis not present

## 2020-05-17 DIAGNOSIS — R296 Repeated falls: Secondary | ICD-10-CM | POA: Diagnosis not present

## 2020-05-17 DIAGNOSIS — N3946 Mixed incontinence: Secondary | ICD-10-CM | POA: Diagnosis not present

## 2020-05-22 DIAGNOSIS — M6281 Muscle weakness (generalized): Secondary | ICD-10-CM | POA: Diagnosis not present

## 2020-05-22 DIAGNOSIS — R1312 Dysphagia, oropharyngeal phase: Secondary | ICD-10-CM | POA: Diagnosis not present

## 2020-05-22 DIAGNOSIS — N3946 Mixed incontinence: Secondary | ICD-10-CM | POA: Diagnosis not present

## 2020-05-22 DIAGNOSIS — R29898 Other symptoms and signs involving the musculoskeletal system: Secondary | ICD-10-CM | POA: Diagnosis not present

## 2020-05-22 DIAGNOSIS — R000 Tachycardia, unspecified: Secondary | ICD-10-CM | POA: Diagnosis not present

## 2020-05-22 DIAGNOSIS — R296 Repeated falls: Secondary | ICD-10-CM | POA: Diagnosis not present

## 2020-05-23 DIAGNOSIS — R29898 Other symptoms and signs involving the musculoskeletal system: Secondary | ICD-10-CM | POA: Diagnosis not present

## 2020-05-23 DIAGNOSIS — R296 Repeated falls: Secondary | ICD-10-CM | POA: Diagnosis not present

## 2020-05-23 DIAGNOSIS — M6281 Muscle weakness (generalized): Secondary | ICD-10-CM | POA: Diagnosis not present

## 2020-05-23 DIAGNOSIS — R1312 Dysphagia, oropharyngeal phase: Secondary | ICD-10-CM | POA: Diagnosis not present

## 2020-05-23 DIAGNOSIS — R000 Tachycardia, unspecified: Secondary | ICD-10-CM | POA: Diagnosis not present

## 2020-05-23 DIAGNOSIS — N3946 Mixed incontinence: Secondary | ICD-10-CM | POA: Diagnosis not present

## 2020-06-20 ENCOUNTER — Non-Acute Institutional Stay (SKILLED_NURSING_FACILITY): Payer: Medicare Other | Admitting: Internal Medicine

## 2020-06-20 ENCOUNTER — Encounter: Payer: Self-pay | Admitting: Internal Medicine

## 2020-06-20 DIAGNOSIS — F339 Major depressive disorder, recurrent, unspecified: Secondary | ICD-10-CM

## 2020-06-20 DIAGNOSIS — J42 Unspecified chronic bronchitis: Secondary | ICD-10-CM | POA: Diagnosis not present

## 2020-06-20 DIAGNOSIS — I10 Essential (primary) hypertension: Secondary | ICD-10-CM | POA: Diagnosis not present

## 2020-06-20 DIAGNOSIS — F0150 Vascular dementia without behavioral disturbance: Secondary | ICD-10-CM | POA: Diagnosis not present

## 2020-06-20 DIAGNOSIS — D509 Iron deficiency anemia, unspecified: Secondary | ICD-10-CM

## 2020-06-20 DIAGNOSIS — K219 Gastro-esophageal reflux disease without esophagitis: Secondary | ICD-10-CM

## 2020-06-20 NOTE — Progress Notes (Signed)
Location:   [facility] Room Number: 38 B Place of Service:  SNF [phone]) Provider:  Einar Crow, MD  Mahlon Gammon, MD  Patient Care Team: Mahlon Gammon, MD as PCP - General (Internal Medicine) Holli Humbles, MD as Referring Physician (Ophthalmology) Mast, Man X, NP as Nurse Practitioner (Internal Medicine)  Extended Emergency Contact Information Primary Emergency Contact: Clarisa Schools Address: [address]          [place], [place] [address] Darden Amber of [place] Home Phone: [phone] Mobile Phone: [phone] Relation: Son Secondary Emergency Contact: Marolyn Haller Address: [address]          [place], [place] [address] Darden Amber of [place] Home Phone: [phone] Mobile Phone: [phone] Relation: Son  Code Status:  DNR Goals of care: Advanced Directive information Advanced Directives 06/20/2020  Does Patient Have a Medical Advance Directive? Yes  Type of Estate agent of [place];Out of facility DNR (pink MOST or yellow form)  Does patient want to make changes to medical advance directive? No - Patient declined  Copy of Healthcare Power of Attorney in Chart? Yes - validated most recent copy scanned in chart (See row information)  Would patient like information on creating a medical advance directive? -  Pre-existing out of facility DNR order (yellow form or pink MOST form) Pink MOST form placed in chart (order not valid for inpatient use);Yellow form placed in chart (order not valid for inpatient use)     Chief Complaint  Patient presents with  . Medical Management of Chronic Issues    Routine follow up visit.    HPI:  Pt is a 86 y.o. female seen today for medical management of chronic diseases.    Patient has a history ofHypertension, anemia, depression,Hyperlipidemia,COPD, hyponatremia,Depression with Anxiety and DementiaAnd Covid Pneumonia  Patient is a long-term resident of  facility  Doing well. Appetite is good Weight is good Mood is stable No New nursing issues Moves around in Wheelchair  Past Medical History:  Diagnosis Date  . Anemia, iron deficiency    takes Ferrous Sulfate daily  . Depression    takes Effexor daily  . Gait disorder 04/21/2014  . GERD (gastroesophageal reflux disease)    takes Omeprazole daily  . Herpes ocular    history of-takes Acyclovir daily  . High cholesterol    takes Atorvastatin daily  . History of hiatal hernia   . Hypertension    takes Metoprolol daily  . Insomnia    takes Xanax nightly  . Osteoarthritis of knee    bilateral  . Sciatic pain    Past Surgical History:  Procedure Laterality Date  . ABDOMINAL HYSTERECTOMY  1995   partial  . CARPAL TUNNEL RELEASE Right 07/21/2007  . CARPAL TUNNEL RELEASE Left 09/24/2007  . COLONOSCOPY    . DESCEMETS STRIPPING AUTOMATED ENDOTHELIAL KERATOPLASTY Left 03/14/2011  . DESCEMETS STRIPPING AUTOMATED ENDOTHELIAL KERATOPLASTY Right 08/20/2012  . EYE SURGERY Bilateral    cataract surgery  . EYE SURGERY Left    corneal transplant  . HERNIA REPAIR    . HIP ARTHROPLASTY Left 06/13/2013   Procedure: ARTHROPLASTY BIPOLAR HIP; Injection left shoulder;  Surgeon: Eulas Post, MD;  Location: [facility] OR;  Service: Orthopedics;  Laterality: Left;  . JOINT REPLACEMENT     bilateral knees, left knee  . KNEE ARTHROSCOPY Right 05/11/2001  . KYPHOPLASTY N/A 04/10/2015   Procedure: T11 Kyphoplasty;  Surgeon: Shirlean Kelly, MD;  Location: [facility];  Service: Neurosurgery;  Laterality:  N/A;  T11 Kyphoplasty  . KYPHOPLASTY N/A 05/06/2015   Procedure: KYPHOPLASTY Thoracic twelve;  Surgeon: Jovita Gamma, MD;  Location: [place] [facility];  Service: Neurosurgery;  Laterality: N/A;  T12 Kyphoplasty  . LAPAROSCOPIC NISSEN FUNDOPLICATION  [id number]  . LUMBAR LAMINECTOMY/DECOMPRESSION MICRODISCECTOMY  08/29/2010   L2-S1  . SHOULDER ARTHROSCOPY WITH ROTATOR CUFF REPAIR AND SUBACROMIAL  DECOMPRESSION Right 01/01/2000  . TONSILLECTOMY     as child  . TOTAL HIP REVISION Left 07/08/2014   Procedure: TOTAL HIP REVISION;  Surgeon: Kerin Salen, MD;  Location: [place];  Service: Orthopedics;  Laterality: Left;  . TOTAL KNEE ARTHROPLASTY Left 05/14/2004  . TOTAL KNEE ARTHROPLASTY  12/23/2011   Procedure: TOTAL KNEE ARTHROPLASTY;  Surgeon: Lorn Junes, MD;  Location: [place];  Service: Orthopedics;  Laterality: Right;  DR [place] THIS CASE  . TRIGGER FINGER RELEASE Right 07/21/2007   thumb  . TRIGGER FINGER RELEASE Left 09/24/2007   middle finger  . TRIGGER FINGER RELEASE Right 03/10/2008   ring and little fingers  . TRIGGER FINGER RELEASE Right 09/02/2012   Procedure: RELEASE TRIGGER FINGER/A-1 PULLEY RIGHT INDEX FINGER;  Surgeon: Wynonia Sours, MD;  Location: [place];  Service: Orthopedics;  Laterality: Right;  . TRIGGER FINGER RELEASE Left 12/22/2012   Procedure: RELEASE TRIGGER FINGER/A-1 PULLEY LEFT RING FINGER;  Surgeon: Wynonia Sours, MD;  Location: [place];  Service: Orthopedics;  Laterality: Left;  Left     Allergies  Allergen Reactions  . Morphine And Related Other (See Comments)    AGITATION, STRANGE DREAMS  . Scopolamine Other (See Comments)    MENTAL CHANGES  . Atorvastatin Other (See Comments)    unknown  . Morphine Other (See Comments)    Disoriented, mood changes  . Nsaids Other (See Comments)    unknown  . Pravachol [Pravastatin Sodium] Other (See Comments)    unknown  . Pravastatin Other (See Comments)    unknown  . Teriparatide Other (See Comments)    unknown    Allergies as of 06/20/2020      Reactions   Morphine And Related Other (See Comments)   AGITATION, STRANGE DREAMS   Scopolamine Other (See Comments)   MENTAL CHANGES   Atorvastatin Other (See Comments)   unknown   Morphine Other (See Comments)   Disoriented, mood changes   Nsaids Other (See Comments)   unknown   Pravachol [pravastatin  Sodium] Other (See Comments)   unknown   Pravastatin Other (See Comments)   unknown   Teriparatide Other (See Comments)   unknown      Medication List       Accurate as of June 20, 2020 10:45 AM. If you have any questions, ask your nurse or doctor.        acetaminophen 500 MG tablet Commonly known as: TYLENOL Take 500 mg by mouth at bedtime.   Artificial Tears 0.2-0.2-1 % Soln Generic drug: Glycerin-Hypromellose-PEG 400 Place 1 drop into both eyes 4 (four) times daily.   aspirin EC 81 MG tablet Take 81 mg by mouth daily.   cholecalciferol 25 MCG (1000 UNIT) tablet Commonly known as: VITAMIN D3 Take 1,000 Units by mouth daily.   fluticasone furoate-vilanterol 100-25 MCG/INH Aepb Commonly known as: BREO ELLIPTA Inhale 1 puff into the lungs daily.   magnesium hydroxide 400 MG/5ML suspension Commonly known as: MILK OF MAGNESIA Take 15 mLs by mouth at bedtime as needed for mild constipation.   memantine 10  MG tablet Commonly known as: NAMENDA Take 10 mg by mouth 2 (two) times daily.   metoprolol tartrate 25 MG tablet Commonly known as: LOPRESSOR Take 25 mg by mouth at bedtime.   metoprolol tartrate 50 MG tablet Commonly known as: LOPRESSOR Take 50 mg by mouth daily. DAILY 07:00 AM - 10:00 AM   mirtazapine 15 MG tablet Commonly known as: REMERON Take 7.5 mg by mouth at bedtime.   nitroGLYCERIN 0.4 MG SL tablet Commonly known as: NITROSTAT Place 0.4 mg under the tongue every 5 (five) minutes as needed for chest pain.   NON FORMULARY Special Instructions: Regular diet with thin liquids   omeprazole 10 MG capsule Commonly known as: PRILOSEC Take 10 mg by mouth daily.   Systane Nighttime Oint Apply to both eyes at bedtime       Review of Systems  Unable to perform ROS: Dementia    Immunization History  Administered Date(s) Administered  . Influenza Whole 03/27/2018  . Influenza, High Dose Seasonal PF 03/26/2019  . Influenza-Unspecified  04/14/2017, 04/05/2020  . Moderna Sars-Covid-2 Vaccination 07/24/2019, 08/21/2019, 05/02/2020  . Pneumococcal Conjugate-13 09/24/2017  . Pneumococcal Polysaccharide-23 06/14/2013  . Td 09/25/2017  . Varicella 06/29/2003   Pertinent  Health Maintenance Due  Topic Date Due  . INFLUENZA VACCINE  Completed  . DEXA SCAN  Completed  . PNA vac Low Risk Adult  Completed   Fall Risk  09/19/2017  Falls in the past year? No   Functional Status Survey:    Vitals:   06/20/20 1028  BP: 130/82  Pulse: 76  Resp: 20  Temp: 98.2 F (36.8 C)  SpO2: 95%  Weight: 113 lb 8 oz (51.5 kg)  Height: 5\' 1"  (1.549 m)   Body mass index is 21.45 kg/m. Physical Exam Vitals reviewed.  Constitutional:      Appearance: Normal appearance.  HENT:     Head: Normocephalic.     Nose: Nose normal.     Mouth/Throat:     Mouth: Mucous membranes are moist.     Pharynx: Oropharynx is clear.  Eyes:     Pupils: Pupils are equal, round, and reactive to light.  Cardiovascular:     Rate and Rhythm: Normal rate and regular rhythm.     Pulses: Normal pulses.  Pulmonary:     Effort: Pulmonary effort is normal.     Breath sounds: Normal breath sounds.  Abdominal:     General: Abdomen is flat. Bowel sounds are normal.     Palpations: Abdomen is soft.  Musculoskeletal:        General: No swelling.     Cervical back: Neck supple.  Skin:    General: Skin is warm.  Neurological:     General: No focal deficit present.     Mental Status: She is alert.     Comments: Stays in Veterinary surgeon commands. Alert but not Oriented  Psychiatric:        Mood and Affect: Mood normal.        Thought Content: Thought content normal.     Labs reviewed: Recent Labs    07/05/19 0000 08/03/19 0000 10/14/19 0000  NA 134* 139 139  K 4.5 4.4 4.9  CL 103 108 105  CO2 23* 26* 28*  BUN 11 14 19   CREATININE 0.6 0.6 0.7  CALCIUM 8.5* 8.4* 9.2   Recent Labs    06/29/19 0000 08/03/19 0000 10/14/19 0000  AST 15 22  26   ALT 18 19 23   ALKPHOS  96 74 66  ALBUMIN 3.3* 2.8* 3.6   Recent Labs    08/03/19 0000 10/14/19 0000 11/25/19 0000  WBC 5.3 5.0 5.0  NEUTROABS 1,882 2,040 1,545  HGB 11.2* 11.9* 11.6*  HCT 33* 37 35*  PLT 303 270 228   Lab Results  Component Value Date   TSH 2.68 10/14/2019   Lab Results  Component Value Date   HGBA1C 5.8 06/29/2013   No results found for: CHOL, HDL, LDLCALC, LDLDIRECT, TRIG, CHOLHDL  Significant Diagnostic Results in last 30 days:  No results found.  Assessment/Plan Essential hypertension Stable on Lopressor Vascular dementia without behavioral disturbance (HCC) Namenda and supportive care Depression, recurrent (HCC) On Remeron Chronic bronchitis, unspecified chronic bronchitis type (HCC) Continue Breo Gastroesophageal reflux disease,  On Prilosec Iron deficiency anemia, unspecified iron deficiency anemia type Off  Iron Hgb stable    Family/ staff Communication:   Labs/tests ordered:

## 2020-07-13 ENCOUNTER — Non-Acute Institutional Stay (SKILLED_NURSING_FACILITY): Payer: Medicare Other | Admitting: Nurse Practitioner

## 2020-07-13 ENCOUNTER — Encounter: Payer: Self-pay | Admitting: Nurse Practitioner

## 2020-07-13 DIAGNOSIS — R000 Tachycardia, unspecified: Secondary | ICD-10-CM

## 2020-07-13 DIAGNOSIS — M1990 Unspecified osteoarthritis, unspecified site: Secondary | ICD-10-CM | POA: Diagnosis not present

## 2020-07-13 DIAGNOSIS — F339 Major depressive disorder, recurrent, unspecified: Secondary | ICD-10-CM

## 2020-07-13 DIAGNOSIS — K219 Gastro-esophageal reflux disease without esophagitis: Secondary | ICD-10-CM | POA: Diagnosis not present

## 2020-07-13 DIAGNOSIS — J42 Unspecified chronic bronchitis: Secondary | ICD-10-CM | POA: Diagnosis not present

## 2020-07-13 DIAGNOSIS — F0150 Vascular dementia without behavioral disturbance: Secondary | ICD-10-CM | POA: Diagnosis not present

## 2020-07-13 NOTE — Assessment & Plan Note (Signed)
OA pain is controlled, on Tylenol 500mg qhs and prn. 

## 2020-07-13 NOTE — Assessment & Plan Note (Signed)
GERD, stable, on Omeprazole 10mg  qd.Hgb 11.6 11/25/19

## 2020-07-13 NOTE — Progress Notes (Signed)
Location:   SNF [place] Room Number: 42B Place of Service:  SNF (31) Provider: [facility] Annagrace Carr NP  [name], MD  Patient Care Team: [name], MD as PCP - General (Internal Medicine) [name], MD as Referring Physician (Ophthalmology) Aashrith Eves X, NP as Nurse Practitioner (Internal Medicine)  Extended Emergency Contact Information Primary Emergency Contact: [name] Address: [address]          [place], [place] [address] [name] of [place] Phone: [phone] Mobile Phone: [phone] Relation: Son Secondary Emergency Contact: [name] Address: [address]          [place], [place] [address] [name] of [place] Phone: [phone] Mobile Phone: [phone] Relation: Son  Code Status:  DNR Goals of care: Advanced Directive information Advanced Directives 06/20/2020  Does Patient Have a Medical Advance Directive? Yes  Type of Paramedic of [place];Out of facility DNR (pink MOST or yellow form)  Does patient want to make changes to medical advance directive? No - Patient declined  Copy of [place] in Chart? Yes - validated most recent copy scanned in chart (See row information)  Would patient like information on creating a medical advance directive? -  Pre-existing out of facility DNR order (yellow form or pink MOST form) Pink MOST form placed in chart (order not valid for inpatient use);Yellow form placed in chart (order not valid for inpatient use)     Chief Complaint  Patient presents with  . Medical Management of Chronic Issues    Routine follow up visit.    HPI:  Pt is a 87 y.o. female seen today for medical management of chronic diseases.    Sleeps/eats at her baseline, takesMirtazapine 7.5mg  qd.  Heart rate is in control, on Metoprolol 50mg  qd, 25mg  qd.  OA pain is controlled, on Tylenol 500mg  qhs and prn. Her  memory is preserved on Mamantine 10mg  qd. No behavioral issues, self propels w/c to get around. TSH 2.68 10/14/19 GERD, stable, on Omeprazole 10mg  qd.Hgb 11.6 11/25/19 COPD/chronic bronchitis, takes Breo Ellipta qd   Past Medical History:  Diagnosis Date  . Anemia, iron deficiency    takes Ferrous Sulfate daily  . Depression    takes Effexor daily  . Gait disorder 04/21/2014  . GERD (gastroesophageal reflux disease)    takes Omeprazole daily  . Herpes ocular    history of-takes Acyclovir daily  . High cholesterol    takes Atorvastatin daily  . History of hiatal hernia   . Hypertension    takes Metoprolol daily  . Insomnia    takes Xanax nightly  . Osteoarthritis of knee    bilateral  . Sciatic pain    Past Surgical History:  Procedure Laterality Date  . ABDOMINAL HYSTERECTOMY  1995   partial  . CARPAL TUNNEL RELEASE Right 07/21/2007  . CARPAL TUNNEL RELEASE Left 09/24/2007  . COLONOSCOPY    . DESCEMETS STRIPPING AUTOMATED ENDOTHELIAL KERATOPLASTY Left 03/14/2011  . DESCEMETS STRIPPING AUTOMATED ENDOTHELIAL KERATOPLASTY Right 08/20/2012  . EYE SURGERY Bilateral    cataract surgery  . EYE SURGERY Left    corneal transplant  . HERNIA REPAIR    . HIP ARTHROPLASTY Left 06/13/2013   Procedure: ARTHROPLASTY BIPOLAR HIP; Injection left shoulder;  Surgeon: Johnny Bridge, MD;  Location: [place];  Service: Orthopedics;  Laterality: Left;  . JOINT REPLACEMENT     bilateral knees, left knee  . KNEE ARTHROSCOPY Right 05/11/2001  . KYPHOPLASTY N/A 04/10/2015  Procedure: T11 Kyphoplasty;  Surgeon: Jovita Gamma, MD;  Location: [place] NEUR[facility] ORS;  Service: Neurosurgery;  Laterality: N/A;  T11 Kyphoplasty  . KYPHOPLASTY N/A 05/06/2015   Procedure: KYPHOPLASTY Thoracic twelve;  Surgeon: Jovita Gamma, MD;  Location: [place] [facility] OR[facility];  Service: Neurosurgery;  Laterality: N/A;  T12 Kyphoplasty  . LAPAROSCOPIC NISSEN FUNDOPLICATION  3/81/8299  . LUMBAR  LAMINECTOMY/DECOMPRESSION MICRODISCECTOMY  08/29/2010   L2-S1  . SHOULDER ARTHROSCOPY WITH ROTATOR CUFF REPAIR AND SUBACROMIAL DECOMPRESSION Right 01/01/2000  . TONSILLECTOMY     as child  . TOTAL HIP REVISION Left 07/08/2014   Procedure: TOTAL HIP REVISION;  Surgeon: Kerin Salen, MD;  Location: [place];  Service: Orthopedics;  Laterality: Left;  . TOTAL KNEE ARTHROPLASTY Left 05/14/2004  . TOTAL KNEE ARTHROPLASTY  12/23/2011   Procedure: TOTAL KNEE ARTHROPLASTY;  Surgeon: Lorn Junes, MD;  Location: [place];  Service: Orthopedics;  Laterality: Right;  DR [place] THIS CASE  . TRIGGER FINGER RELEASE Right 07/21/2007   thumb  . TRIGGER FINGER RELEASE Left 09/24/2007   middle finger  . TRIGGER FINGER RELEASE Right 03/10/2008   ring and little fingers  . TRIGGER FINGER RELEASE Right 09/02/2012   Procedure: RELEASE TRIGGER FINGER/A-1 PULLEY RIGHT INDEX FINGER;  Surgeon: Wynonia Sours, MD;  Location: [place];  Service: Orthopedics;  Laterality: Right;  . TRIGGER FINGER RELEASE Left 12/22/2012   Procedure: RELEASE TRIGGER FINGER/A-1 PULLEY LEFT RING FINGER;  Surgeon: Wynonia Sours, MD;  Location: [place] [facility];  Service: Orthopedics;  Laterality: Left;  Left     Allergies  Allergen Reactions  . Morphine And Related Other (See Comments)    AGITATION, STRANGE DREAMS  . Scopolamine Other (See Comments)    MENTAL CHANGES  . Atorvastatin Other (See Comments)    unknown  . Morphine Other (See Comments)    Disoriented, mood changes  . Nsaids Other (See Comments)    unknown  . Pravachol [Pravastatin Sodium] Other (See Comments)    unknown  . Pravastatin Other (See Comments)    unknown  . Teriparatide Other (See Comments)    unknown    Allergies as of 07/13/2020      Reactions   Morphine And Related Other (See Comments)   AGITATION, STRANGE DREAMS   Scopolamine Other (See Comments)   MENTAL CHANGES   Atorvastatin Other (See Comments)   unknown    Morphine Other (See Comments)   Disoriented, mood changes   Nsaids Other (See Comments)   unknown   Pravachol [pravastatin Sodium] Other (See Comments)   unknown   Pravastatin Other (See Comments)   unknown   Teriparatide Other (See Comments)   unknown      Medication List       Accurate as of July 13, 2020 11:59 PM. If you have any questions, ask your nurse or doctor.        acetaminophen 500 MG tablet Commonly known as: TYLENOL Take 500 mg by mouth at bedtime.   Artificial Tears 0.2-0.2-1 % Soln Generic drug: Glycerin-Hypromellose-PEG 400 Place 1 drop into both eyes 4 (four) times daily.   aspirin EC 81 MG tablet Take 81 mg by mouth daily.   cholecalciferol 25 MCG (1000 UNIT) tablet Commonly known as: VITAMIN D3 Take 1,000 Units by mouth daily.   fluticasone furoate-vilanterol 100-25 MCG/INH Aepb Commonly known as: BREO ELLIPTA Inhale 1 puff into the lungs daily.   magnesium hydroxide 400 MG/5ML suspension Commonly known as: MILK  OF MAGNESIA Take 15 mLs by mouth at bedtime as needed for mild constipation.   memantine 10 MG tablet Commonly known as: NAMENDA Take 10 mg by mouth 2 (two) times daily.   metoprolol tartrate 25 MG tablet Commonly known as: LOPRESSOR Take 25 mg by mouth at bedtime.   metoprolol tartrate 50 MG tablet Commonly known as: LOPRESSOR Take 50 mg by mouth daily. DAILY 07:00 AM - 10:00 AM   mirtazapine 15 MG tablet Commonly known as: REMERON Take 7.5 mg by mouth at bedtime.   nitroGLYCERIN 0.4 MG SL tablet Commonly known as: NITROSTAT Place 0.4 mg under the tongue every 5 (five) minutes as needed for chest pain.   NON FORMULARY Special Instructions: Regular diet with thin liquids   omeprazole 10 MG capsule Commonly known as: PRILOSEC Take 10 mg by mouth daily.   Systane Nighttime Oint Apply to both eyes at bedtime       Review of Systems  Constitutional: Negative for fatigue, fever and unexpected weight change.        Gradually has gained a pound to two in the past 2-3 months.   HENT: Positive for hearing loss. Negative for congestion and voice change.   Eyes: Negative for visual disturbance.  Respiratory: Negative for cough.   Cardiovascular: Negative for leg swelling.  Gastrointestinal: Negative for abdominal pain and constipation.  Genitourinary: Negative for difficulty urinating, dysuria and urgency.  Musculoskeletal: Positive for arthralgias, back pain and gait problem.  Skin: Negative for color change.  Neurological: Negative for speech difficulty, weakness, light-headedness and headaches.       Dementia  Psychiatric/Behavioral: Positive for confusion. Negative for behavioral problems and sleep disturbance. The patient is not nervous/anxious.     Immunization History  Administered Date(s) Administered  . Influenza Whole 03/27/2018  . Influenza, High Dose Seasonal PF 03/26/2019  . Influenza-Unspecified 04/14/2017, 04/05/2020  . Moderna Sars-Covid-2 Vaccination 07/24/2019, 08/21/2019, 05/02/2020  . Pneumococcal Conjugate-13 09/24/2017  . Pneumococcal Polysaccharide-23 06/14/2013  . Td 09/25/2017  . Varicella 06/29/2003   Pertinent  Health Maintenance Due  Topic Date Due  . INFLUENZA VACCINE  Completed  . DEXA SCAN  Completed  . PNA vac Low Risk Adult  Completed   Fall Risk  09/19/2017  Falls in the past year? No   Functional Status Survey:    Vitals:   07/13/20 1500  BP: 130/66  Pulse: 65  Resp: 20  Temp: 98.4 F (36.9 C)  SpO2: 94%  Weight: 115 lb 11.2 oz (52.5 kg)  Height: 5\' 1"  (1.549 m)   Body mass index is 21.86 kg/m. Physical Exam Vitals and nursing note reviewed.  Constitutional:      Appearance: Normal appearance.  HENT:     Head: Normocephalic and atraumatic.     Mouth/Throat:     Mouth: Mucous membranes are moist.  Eyes:     Extraocular Movements: Extraocular movements intact.     Conjunctiva/sclera: Conjunctivae normal.     Pupils: Pupils are equal, round,  and reactive to light.  Cardiovascular:     Rate and Rhythm: Normal rate and regular rhythm.     Heart sounds: No murmur heard.   Pulmonary:     Breath sounds: No wheezing or rales.  Abdominal:     Palpations: Abdomen is soft.     Tenderness: There is no abdominal tenderness.  Musculoskeletal:     Cervical back: Normal range of motion.     Right lower leg: No edema.     Left lower  leg: No edema.  Skin:    General: Skin is warm and dry.  Neurological:     General: No focal deficit present.     Mental Status: She is alert. Mental status is at baseline.     Gait: Gait abnormal.     Comments: Oriented to self.   Psychiatric:        Mood and Affect: Mood normal.        Behavior: Behavior normal.     Labs reviewed: Recent Labs    08/03/19 0000 10/14/19 0000  NA 139 139  K 4.4 4.9  CL 108 105  CO2 26* 28*  BUN 14 19  CREATININE 0.6 0.7  CALCIUM 8.4* 9.2   Recent Labs    08/03/19 0000 10/14/19 0000  AST 22 26  ALT 19 23  ALKPHOS 74 66  ALBUMIN 2.8* 3.6   Recent Labs    08/03/19 0000 10/14/19 0000 11/25/19 0000  WBC 5.3 5.0 5.0  NEUTROABS 1,882 2,040 1,545  HGB 11.2* 11.9* 11.6*  HCT 33* 37 35*  PLT 303 270 228   Lab Results  Component Value Date   TSH 2.68 10/14/2019   Lab Results  Component Value Date   HGBA1C 5.8 06/29/2013   No results found for: CHOL, HDL, LDLCALC, LDLDIRECT, TRIG, CHOLHDL  Significant Diagnostic Results in last 30 days:  No results found.  Assessment/Plan  GERD (gastroesophageal reflux disease) GERD, stable, on Omeprazole 10mg  qd.Hgb 11.6 11/25/19  Chronic bronchitis (HCC) COPD/chronic bronchitis, takes Breo Ellipta qd    Vascular dementia ([place]) Her memory is preserved on Mamantine 10mg  qd. No behavioral issues, self propels w/c to get around.   Osteoarthritis OA pain is controlled, on Tylenol 500mg  qhs and prn.  Tachycardia Heart rate is in control, on Metoprolol 50mg  qd, 25mg  qd.   Depression, recurrent  (HCC) Sleeps/eats at her baseline, takesMirtazapine 7.5mg  qd.    Family/ staff Communication: plan of care reviewed with the patient and charge nurse.   Labs/tests ordered:  none  Time spend 35 minutes.

## 2020-07-13 NOTE — Assessment & Plan Note (Signed)
Sleeps/eats at her baseline, takesMirtazapine 7.5mg qd.   

## 2020-07-13 NOTE — Assessment & Plan Note (Signed)
COPD/chronic bronchitis, takes Breo Ellipta qd  

## 2020-07-13 NOTE — Assessment & Plan Note (Signed)
Heart rate is in control, on Metoprolol 50mg qd, 25mg qd.  

## 2020-07-13 NOTE — Assessment & Plan Note (Signed)
Her memory is preserved on Mamantine 10mg  qd. No behavioral issues, self propels w/c to get around.

## 2020-07-14 ENCOUNTER — Encounter: Payer: Self-pay | Admitting: Nurse Practitioner

## 2020-08-09 ENCOUNTER — Encounter: Payer: Self-pay | Admitting: Nurse Practitioner

## 2020-08-09 ENCOUNTER — Non-Acute Institutional Stay (SKILLED_NURSING_FACILITY): Payer: Medicare Other | Admitting: Nurse Practitioner

## 2020-08-09 DIAGNOSIS — K219 Gastro-esophageal reflux disease without esophagitis: Secondary | ICD-10-CM | POA: Diagnosis not present

## 2020-08-09 DIAGNOSIS — F0150 Vascular dementia without behavioral disturbance: Secondary | ICD-10-CM

## 2020-08-09 DIAGNOSIS — R000 Tachycardia, unspecified: Secondary | ICD-10-CM

## 2020-08-09 DIAGNOSIS — J410 Simple chronic bronchitis: Secondary | ICD-10-CM | POA: Diagnosis not present

## 2020-08-09 DIAGNOSIS — M5416 Radiculopathy, lumbar region: Secondary | ICD-10-CM | POA: Diagnosis not present

## 2020-08-09 DIAGNOSIS — F339 Major depressive disorder, recurrent, unspecified: Secondary | ICD-10-CM

## 2020-08-09 NOTE — Assessment & Plan Note (Signed)
Her memory is preserved on Mamantine 10mg qd.No behavioral issues, self propels w/c to get around.TSH 2.68 10/14/19  

## 2020-08-09 NOTE — Assessment & Plan Note (Signed)
Sleeps/eats at her baseline, takesMirtazapine 7.5mg qd.   

## 2020-08-09 NOTE — Assessment & Plan Note (Signed)
Stable, continue  Breo Ellipta qd

## 2020-08-09 NOTE — Assessment & Plan Note (Signed)
stable, on Omeprazole 10mg qd.Hgb 11.6 11/25/19  

## 2020-08-09 NOTE — Assessment & Plan Note (Signed)
Heart rate is in control, on Metoprolol 50mg qd, 25mg qd.  

## 2020-08-09 NOTE — Assessment & Plan Note (Signed)
Better since the patient using w/c for mobility.  Continue  Tylenol 500mg  qhs and prn.

## 2020-08-09 NOTE — Progress Notes (Signed)
Location:   [facility] Room Number: 42 Place of Service:  SNF (31) Provider:  Elanore Talcott, Lennie Odor NP  [name], MD  Patient Care Team: [name], MD as PCP - General (Internal Medicine) [name], MD as Referring Physician (Ophthalmology) Indonesia Mckeough X, NP as Nurse Practitioner (Internal Medicine)  Extended Emergency Contact Information Primary Emergency Contact: [name] Address: [address]          [place],  [address] [name] of [place] Phone: [phone] Mobile Phone: [phone] Relation: Son Secondary Emergency Contact: [name] Address: [address]          [place], [place] [address] [name] of [place] Phone: [phone] Mobile Phone: [phone] Relation: Son  Code Status:  DNR Goals of care: Advanced Directive information Advanced Directives 08/09/2020  Does Patient Have a Medical Advance Directive? Yes  Type of Advance Directive Out of facility DNR (pink MOST or yellow form);Healthcare Power of Attorney  Does patient want to make changes to medical advance directive? No - Guardian declined  Copy of [place] in Chart? Yes - validated most recent copy scanned in chart (See row information)  Would patient like information on creating a medical advance directive? -  Pre-existing out of facility DNR order (yellow form or pink MOST form) Pink MOST form placed in chart (order not valid for inpatient use);Yellow form placed in chart (order not valid for inpatient use)     Chief Complaint  Patient presents with  . Medical Management of Chronic Issues    HPI:  Pt is a 87 y.o. female seen today for medical management of chronic diseases.     Sleeps/eats at her baseline, takesMirtazapine 7.5mg  qd.  Heart rate is in control, on Metoprolol 50mg  qd, 25mg  qd.  OA pain is controlled, on Tylenol 500mg  qhs and prn. Her memory is  preserved on Mamantine 10mg  qd.No behavioral issues, self propels w/c to get around. TSH 2.68 10/14/19 GERD, stable, on Omeprazole 10mg  qd.Hgb 11.6 11/25/19 COPD/chronic bronchitis, takes Breo Ellipta qd  Past Medical History:  Diagnosis Date  . Anemia, iron deficiency    takes Ferrous Sulfate daily  . Depression    takes Effexor daily  . Gait disorder 04/21/2014  . GERD (gastroesophageal reflux disease)    takes Omeprazole daily  . Herpes ocular    history of-takes Acyclovir daily  . High cholesterol    takes Atorvastatin daily  . History of hiatal hernia   . Hypertension    takes Metoprolol daily  . Insomnia    takes Xanax nightly  . Osteoarthritis of knee    bilateral  . Sciatic pain    Past Surgical History:  Procedure Laterality Date  . ABDOMINAL HYSTERECTOMY  1995   partial  . CARPAL TUNNEL RELEASE Right 07/21/2007  . CARPAL TUNNEL RELEASE Left 09/24/2007  . COLONOSCOPY    . DESCEMETS STRIPPING AUTOMATED ENDOTHELIAL KERATOPLASTY Left 03/14/2011  . DESCEMETS STRIPPING AUTOMATED ENDOTHELIAL KERATOPLASTY Right 08/20/2012  . EYE SURGERY Bilateral    cataract surgery  . EYE SURGERY Left    corneal transplant  . HERNIA REPAIR    . HIP ARTHROPLASTY Left 06/13/2013   Procedure: ARTHROPLASTY BIPOLAR HIP; Injection left shoulder;  Surgeon: Johnny Bridge, MD;  Location: [facility];  Service: Orthopedics;  Laterality: Left;  . JOINT REPLACEMENT     bilateral knees, left knee  . KNEE ARTHROSCOPY Right 05/11/2001  . KYPHOPLASTY N/A 04/10/2015   Procedure: T11 Kyphoplasty;  Surgeon: Jovita Gamma, MD;  Location: [facility]UR[facility];  Service: Neurosurgery;  Laterality: N/A;  T11 Kyphoplasty  . KYPHOPLASTY N/A 05/06/2015   Procedure: KYPHOPLASTY Thoracic twelve;  Surgeon: Jovita Gamma, MD;  Location: [facility] NEURO ORS;  Service: Neurosurgery;  Laterality: N/A;  T12 Kyphoplasty  . LAPAROSCOPIC NISSEN FUNDOPLICATION  2/77/8242  . LUMBAR LAMINECTOMY/DECOMPRESSION  MICRODISCECTOMY  08/29/2010   L2-S1  . SHOULDER ARTHROSCOPY WITH ROTATOR CUFF REPAIR AND SUBACROMIAL DECOMPRESSION Right 01/01/2000  . TONSILLECTOMY     as child  . TOTAL HIP REVISION Left 07/08/2014   Procedure: TOTAL HIP REVISION;  Surgeon: Kerin Salen, MD;  Location: [facility];  Service: Orthopedics;  Laterality: Left;  . TOTAL KNEE ARTHROPLASTY Left 05/14/2004  . TOTAL KNEE ARTHROPLASTY  12/23/2011   Procedure: TOTAL KNEE ARTHROPLASTY;  Surgeon: Lorn Junes, MD;  Location: [facility];  Service: Orthopedics;  Laterality: Right;  DR [place] THIS CASE  . TRIGGER FINGER RELEASE Right 07/21/2007   thumb  . TRIGGER FINGER RELEASE Left 09/24/2007   middle finger  . TRIGGER FINGER RELEASE Right 03/10/2008   ring and little fingers  . TRIGGER FINGER RELEASE Right 09/02/2012   Procedure: RELEASE TRIGGER FINGER/A-1 PULLEY RIGHT INDEX FINGER;  Surgeon: Wynonia Sours, MD;  Location: [facility];  Service: Orthopedics;  Laterality: Right;  . TRIGGER FINGER RELEASE Left 12/22/2012   Procedure: RELEASE TRIGGER FINGER/A-1 PULLEY LEFT RING FINGER;  Surgeon: Wynonia Sours, MD;  Location: [facility];  Service: Orthopedics;  Laterality: Left;  Left     Allergies  Allergen Reactions  . Morphine And Related Other (See Comments)    AGITATION, STRANGE DREAMS  . Scopolamine Other (See Comments)    MENTAL CHANGES  . Atorvastatin Other (See Comments)    unknown  . Morphine Other (See Comments)    Disoriented, mood changes  . Nsaids Other (See Comments)    unknown  . Pravachol [Pravastatin Sodium] Other (See Comments)    unknown  . Pravastatin Other (See Comments)    unknown  . Teriparatide Other (See Comments)    unknown    Allergies as of 08/09/2020      Reactions   Morphine And Related Other (See Comments)   AGITATION, STRANGE DREAMS   Scopolamine Other (See Comments)   MENTAL CHANGES   Atorvastatin Other (See Comments)   unknown   Morphine Other (See  Comments)   Disoriented, mood changes   Nsaids Other (See Comments)   unknown   Pravachol [pravastatin Sodium] Other (See Comments)   unknown   Pravastatin Other (See Comments)   unknown   Teriparatide Other (See Comments)   unknown      Medication List       Accurate as of August 09, 2020  1:58 PM. If you have any questions, ask your nurse or doctor.        acetaminophen 500 MG tablet Commonly known as: TYLENOL Take 500 mg by mouth at bedtime.   Artificial Tears 0.2-0.2-1 % Soln Generic drug: Glycerin-Hypromellose-PEG 400 Place 1 drop into both eyes 4 (four) times daily.   aspirin EC 81 MG tablet Take 81 mg by mouth daily.   cholecalciferol 25 MCG (1000 UNIT) tablet Commonly known as: VITAMIN D3 Take 1,000 Units by mouth daily.   fluticasone furoate-vilanterol 100-25 MCG/INH Aepb Commonly known as: BREO ELLIPTA Inhale 1 puff into the lungs daily.   magnesium hydroxide 400 MG/5ML suspension Commonly known as: MILK OF MAGNESIA Take  15 mLs by mouth at bedtime as needed for mild constipation.   memantine 10 MG tablet Commonly known as: NAMENDA Take 10 mg by mouth 2 (two) times daily.   metoprolol tartrate 25 MG tablet Commonly known as: LOPRESSOR Take 25 mg by mouth at bedtime.   metoprolol tartrate 50 MG tablet Commonly known as: LOPRESSOR Take 50 mg by mouth daily. DAILY 07:00 AM - 10:00 AM   mirtazapine 15 MG tablet Commonly known as: REMERON Take 7.5 mg by mouth at bedtime.   nitroGLYCERIN 0.4 MG SL tablet Commonly known as: NITROSTAT Place 0.4 mg under the tongue every 5 (five) minutes as needed for chest pain.   NON FORMULARY Special Instructions: Regular diet with thin liquids   omeprazole 10 MG capsule Commonly known as: PRILOSEC Take 10 mg by mouth daily.   Systane Nighttime Oint Apply to both eyes at bedtime       Review of Systems  Constitutional: Negative for activity change, fever and unexpected weight change.       Gradually  has gained a pound to two in the past 2-3 months.   HENT: Positive for hearing loss. Negative for congestion and voice change.   Eyes: Negative for visual disturbance.  Respiratory: Negative for cough.   Cardiovascular: Negative for leg swelling.  Gastrointestinal: Negative for abdominal pain and constipation.  Genitourinary: Negative for dysuria and urgency.  Musculoskeletal: Positive for arthralgias, back pain and gait problem.  Skin: Negative for color change.  Neurological: Negative for dizziness, speech difficulty and weakness.       Dementia  Psychiatric/Behavioral: Positive for confusion. Negative for behavioral problems and sleep disturbance. The patient is not nervous/anxious.     Immunization History  Administered Date(s) Administered  . Influenza Whole 03/27/2018  . Influenza, High Dose Seasonal PF 03/26/2019  . Influenza-Unspecified 04/14/2017, 04/05/2020  . Moderna Sars-Covid-2 Vaccination 07/24/2019, 08/21/2019, 05/02/2020  . Pneumococcal Conjugate-13 09/24/2017  . Pneumococcal Polysaccharide-23 06/14/2013  . Td 09/25/2017  . Varicella 06/29/2003   Pertinent  Health Maintenance Due  Topic Date Due  . INFLUENZA VACCINE  Completed  . DEXA SCAN  Completed  . PNA vac Low Risk Adult  Completed   Fall Risk  09/19/2017  Falls in the past year? No   Functional Status Survey:    Vitals:   08/09/20 0923  BP: 130/87  Pulse: 86  Resp: 20  Temp: 98 F (36.7 C)  SpO2: 94%  Weight: 114 lb 14.4 oz (52.1 kg)  Height: 5\' 1"  (1.549 m)   Body mass index is 21.71 kg/m. Physical Exam Vitals and nursing note reviewed.  Constitutional:      Appearance: Normal appearance.  HENT:     Head: Normocephalic and atraumatic.     Mouth/Throat:     Mouth: Mucous membranes are moist.  Eyes:     Extraocular Movements: Extraocular movements intact.     Conjunctiva/sclera: Conjunctivae normal.     Pupils: Pupils are equal, round, and reactive to light.  Cardiovascular:     Rate  and Rhythm: Normal rate and regular rhythm.     Heart sounds: No murmur heard.   Pulmonary:     Effort: Pulmonary effort is normal.     Breath sounds: No rales.  Abdominal:     Palpations: Abdomen is soft.     Tenderness: There is no abdominal tenderness.  Musculoskeletal:     Cervical back: Normal range of motion and neck supple.     Right lower leg: No edema.  Left lower leg: No edema.  Skin:    General: Skin is warm and dry.  Neurological:     General: No focal deficit present.     Mental Status: She is alert. Mental status is at baseline.     Gait: Gait abnormal.     Comments: Oriented to self.   Psychiatric:        Mood and Affect: Mood normal.        Behavior: Behavior normal.     Labs reviewed: Recent Labs    10/14/19 0000  NA 139  K 4.9  CL 105  CO2 28*  BUN 19  CREATININE 0.7  CALCIUM 9.2   Recent Labs    10/14/19 0000  AST 26  ALT 23  ALKPHOS 66  ALBUMIN 3.6   Recent Labs    10/14/19 0000 11/25/19 0000  WBC 5.0 5.0  NEUTROABS 2,040 1,545  HGB 11.9* 11.6*  HCT 37 35*  PLT 270 228   Lab Results  Component Value Date   TSH 2.68 10/14/2019   Lab Results  Component Value Date   HGBA1C 5.8 06/29/2013   No results found for: CHOL, HDL, LDLCALC, LDLDIRECT, TRIG, CHOLHDL  Significant Diagnostic Results in last 30 days:  No results found.  Assessment/Plan Vascular dementia [facility]) Her memory is preserved on Mamantine 10mg  qd.No behavioral issues, self propels w/c to get around. TSH 2.68 10/14/19   GERD (gastroesophageal reflux disease) stable, on Omeprazole 10mg  qd.Hgb 11.6 11/25/19   Chronic bronchitis (HCC) Stable, continue  Breo Ellipta qd   Lumbar radiculopathy, chronic Better since the patient using w/c for mobility.  Continue  Tylenol 500mg  qhs and prn.   Tachycardia Heart rate is in control, on Metoprolol 50mg  qd, 25mg  qd.  Depression, recurrent (HCC) Sleeps/eats at her baseline, takesMirtazapine 7.5mg  qd.      Family/ staff Communication: plan of care reviewed with the patient and charge nurse.   Labs/tests ordered:  none  Time spend 35 minutes.

## 2020-09-12 ENCOUNTER — Non-Acute Institutional Stay (SKILLED_NURSING_FACILITY): Payer: Medicare Other | Admitting: Internal Medicine

## 2020-09-12 ENCOUNTER — Encounter: Payer: Self-pay | Admitting: Internal Medicine

## 2020-09-12 DIAGNOSIS — D509 Iron deficiency anemia, unspecified: Secondary | ICD-10-CM | POA: Diagnosis not present

## 2020-09-12 DIAGNOSIS — F0150 Vascular dementia without behavioral disturbance: Secondary | ICD-10-CM

## 2020-09-12 DIAGNOSIS — F339 Major depressive disorder, recurrent, unspecified: Secondary | ICD-10-CM

## 2020-09-12 DIAGNOSIS — I10 Essential (primary) hypertension: Secondary | ICD-10-CM | POA: Diagnosis not present

## 2020-09-12 NOTE — Progress Notes (Signed)
Location:   [facility] Room Number: 42 Place of Service:  SNF [phone]) Provider:  Veleta Miners MD  [name], MD  Patient Care Team: [name], MD as PCP - General (Internal Medicine) [name], MD as Referring Physician (Ophthalmology) Mast, Man X, NP as Nurse Practitioner (Internal Medicine)  Extended Emergency Contact Information Primary Emergency Contact: [name] Address: [address]          [place], [place] [address] [name] of [place] Phone: [phone] Mobile Phone: [phone] Relation: Son Secondary Emergency Contact: [name] Address: [address]          [place], [place] [address] [name] of [place] Phone: [phone] Mobile Phone: [phone] Relation: Son  Code Status:  DNR Goals of care: Advanced Directive information Advanced Directives 09/12/2020  Does Patient Have a Medical Advance Directive? Yes  Type of Advance Directive Out of facility DNR (pink MOST or yellow form);Healthcare Power of Attorney  Does patient want to make changes to medical advance directive? -  Copy of [place] in Chart? Yes - validated most recent copy scanned in chart (See row information)  Would patient like information on creating a medical advance directive? -  Pre-existing out of facility DNR order (yellow form or pink MOST form) Pink MOST form placed in chart (order not valid for inpatient use);Yellow form placed in chart (order not valid for inpatient use)     Chief Complaint  Patient presents with  . Medical Management of Chronic Issues    HPI:  Pt is a 87 y.o. female seen today for medical management of chronic diseases.    Patient has a history ofHypertension, anemia, depression,Hyperlipidemia,COPD, hyponatremia,Depression with Anxiety and DementiaAnd Covid Pneumonia  Patient is a long-term resident of facility  Doing well  Weight stable No New Nursing  issues Moves around in the [place] in her Wheelchair  Past Medical History:  Diagnosis Date  . Anemia, iron deficiency    takes Ferrous Sulfate daily  . Depression    takes Effexor daily  . Gait disorder 04/21/2014  . GERD (gastroesophageal reflux disease)    takes Omeprazole daily  . Herpes ocular    history of-takes Acyclovir daily  . High cholesterol    takes Atorvastatin daily  . History of hiatal hernia   . Hypertension    takes Metoprolol daily  . Insomnia    takes Xanax nightly  . Osteoarthritis of knee    bilateral  . Sciatic pain    Past Surgical History:  Procedure Laterality Date  . ABDOMINAL HYSTERECTOMY  1995   partial  . CARPAL TUNNEL RELEASE Right 07/21/2007  . CARPAL TUNNEL RELEASE Left 09/24/2007  . COLONOSCOPY    . DESCEMETS STRIPPING AUTOMATED ENDOTHELIAL KERATOPLASTY Left 03/14/2011  . DESCEMETS STRIPPING AUTOMATED ENDOTHELIAL KERATOPLASTY Right 08/20/2012  . EYE SURGERY Bilateral    cataract surgery  . EYE SURGERY Left    corneal transplant  . HERNIA REPAIR    . HIP ARTHROPLASTY Left 06/13/2013   Procedure: ARTHROPLASTY BIPOLAR HIP; Injection left shoulder;  Surgeon: Johnny Bridge, MD;  Location: [facility];  Service: Orthopedics;  Laterality: Left;  . JOINT REPLACEMENT     bilateral knees, left knee  . KNEE ARTHROSCOPY Right 05/11/2001  . KYPHOPLASTY N/A 04/10/2015   Procedure: T11 Kyphoplasty;  Surgeon: Jovita Gamma, MD;  Location: [facility] NEURO ORS;  Service: Neurosurgery;  Laterality: N/A;  T11 Kyphoplasty  . KYPHOPLASTY N/A 05/06/2015   Procedure:  KYPHOPLASTY Thoracic twelve;  Surgeon: Jovita Gamma, MD;  Location: [facility] NEURO ORS;  Service: Neurosurgery;  Laterality: N/A;  T12 Kyphoplasty  . LAPAROSCOPIC NISSEN FUNDOPLICATION  2/87/6811  . LUMBAR LAMINECTOMY/DECOMPRESSION MICRODISCECTOMY  08/29/2010   L2-S1  . SHOULDER ARTHROSCOPY WITH ROTATOR CUFF REPAIR AND SUBACROMIAL DECOMPRESSION Right 01/01/2000  . TONSILLECTOMY     as child  . TOTAL  HIP REVISION Left 07/08/2014   Procedure: TOTAL HIP REVISION;  Surgeon: Kerin Salen, MD;  Location: [facility];  Service: Orthopedics;  Laterality: Left;  . TOTAL KNEE ARTHROPLASTY Left 05/14/2004  . TOTAL KNEE ARTHROPLASTY  12/23/2011   Procedure: TOTAL KNEE ARTHROPLASTY;  Surgeon: Lorn Junes, MD;  Location: [facility];  Service: Orthopedics;  Laterality: Right;  DR [place] THIS CASE  . TRIGGER FINGER RELEASE Right 07/21/2007   thumb  . TRIGGER FINGER RELEASE Left 09/24/2007   middle finger  . TRIGGER FINGER RELEASE Right 03/10/2008   ring and little fingers  . TRIGGER FINGER RELEASE Right 09/02/2012   Procedure: RELEASE TRIGGER FINGER/A-1 PULLEY RIGHT INDEX FINGER;  Surgeon: Wynonia Sours, MD;  Location: [facility];  Service: Orthopedics;  Laterality: Right;  . TRIGGER FINGER RELEASE Left 12/22/2012   Procedure: RELEASE TRIGGER FINGER/A-1 PULLEY LEFT RING FINGER;  Surgeon: Wynonia Sours, MD;  Location: [facility];  Service: Orthopedics;  Laterality: Left;  Left     Allergies  Allergen Reactions  . Morphine And Related Other (See Comments)    AGITATION, STRANGE DREAMS  . Scopolamine Other (See Comments)    MENTAL CHANGES  . Atorvastatin Other (See Comments)    unknown  . Morphine Other (See Comments)    Disoriented, mood changes  . Nsaids Other (See Comments)    unknown  . Pravachol [Pravastatin Sodium] Other (See Comments)    unknown  . Pravastatin Other (See Comments)    unknown  . Teriparatide Other (See Comments)    unknown    Allergies as of 09/12/2020      Reactions   Morphine And Related Other (See Comments)   AGITATION, STRANGE DREAMS   Scopolamine Other (See Comments)   MENTAL CHANGES   Atorvastatin Other (See Comments)   unknown   Morphine Other (See Comments)   Disoriented, mood changes   Nsaids Other (See Comments)   unknown   Pravachol [pravastatin Sodium] Other (See Comments)   unknown   Pravastatin Other (See  Comments)   unknown   Teriparatide Other (See Comments)   unknown      Medication List       Accurate as of September 12, 2020 11:00 AM. If you have any questions, ask your nurse or doctor.        acetaminophen 500 MG tablet Commonly known as: TYLENOL Take 500 mg by mouth at bedtime.   Artificial Tears 0.2-0.2-1 % Soln Generic drug: Glycerin-Hypromellose-PEG 400 Place 1 drop into both eyes 4 (four) times daily.   aspirin EC 81 MG tablet Take 81 mg by mouth daily.   cholecalciferol 25 MCG (1000 UNIT) tablet Commonly known as: VITAMIN D3 Take 1,000 Units by mouth daily.   fluticasone furoate-vilanterol 100-25 MCG/INH Aepb Commonly known as: BREO ELLIPTA Inhale 1 puff into the lungs daily.   magnesium hydroxide 400 MG/5ML suspension Commonly known as: MILK OF MAGNESIA Take 15 mLs by mouth at bedtime as needed for mild constipation.   memantine 10 MG tablet Commonly known as: NAMENDA Take 10 mg by mouth 2 (  two) times daily.   metoprolol tartrate 25 MG tablet Commonly known as: LOPRESSOR Take 25 mg by mouth at bedtime.   metoprolol tartrate 50 MG tablet Commonly known as: LOPRESSOR Take 50 mg by mouth daily. DAILY 07:00 AM - 10:00 AM   mirtazapine 15 MG tablet Commonly known as: REMERON Take 7.5 mg by mouth at bedtime.   nitroGLYCERIN 0.4 MG SL tablet Commonly known as: NITROSTAT Place 0.4 mg under the tongue every 5 (five) minutes as needed for chest pain.   NON FORMULARY Special Instructions: Regular diet with thin liquids   omeprazole 10 MG capsule Commonly known as: PRILOSEC Take 10 mg by mouth daily.   Systane Nighttime Oint Apply to both eyes at bedtime       Review of Systems  Unable to perform ROS: Dementia    Immunization History  Administered Date(s) Administered  . Influenza Whole 03/27/2018  . Influenza, High Dose Seasonal PF 03/26/2019  . Influenza-Unspecified 04/14/2017, 04/05/2020  . Moderna Sars-Covid-2 Vaccination 07/24/2019,  08/21/2019, 05/02/2020  . Pneumococcal Conjugate-13 09/24/2017  . Pneumococcal Polysaccharide-23 06/14/2013  . Td 09/25/2017  . Varicella 06/29/2003   Pertinent  Health Maintenance Due  Topic Date Due  . INFLUENZA VACCINE  Completed  . DEXA SCAN  Completed  . PNA vac Low Risk Adult  Completed   Fall Risk  09/19/2017  Falls in the past year? No   Functional Status Survey:    Vitals:   09/12/20 1023  BP: 115/72  Pulse: 100  Resp: 16  Temp: 97.9 F (36.6 C)  SpO2: 92%  Weight: 116 lb 6.4 oz (52.8 kg)  Height: 5\' 1"  (1.549 m)   Body mass index is 21.99 kg/m. Physical Exam  Constitutional: Well-developed and well-nourished.  HENT:  Head: Normocephalic.  Mouth/Throat: Oropharynx is clear and moist.  Eyes: Pupils are equal, round, and reactive to light.  Neck: Neck supple.  Cardiovascular: Normal rate and normal heart sounds.  No murmur heard. Pulmonary/Chest: Effort normal and breath sounds normal. No respiratory distress. No wheezes. She has no rales.  Abdominal: Soft. Bowel sounds are normal. No distension. There is no tenderness. There is no rebound.  Musculoskeletal: No edema.  Lymphadenopathy: none Neurological:Stays in Wheelchair Follows commands. Alert but not Oriented  Skin: Skin is warm and dry.  Psychiatric: Normal mood and affect. Behavior is normal. Thought content normal.    Labs reviewed: Recent Labs    10/14/19 0000  NA 139  K 4.9  CL 105  CO2 28*  BUN 19  CREATININE 0.7  CALCIUM 9.2   Recent Labs    10/14/19 0000  AST 26  ALT 23  ALKPHOS 66  ALBUMIN 3.6   Recent Labs    10/14/19 0000 11/25/19 0000  WBC 5.0 5.0  NEUTROABS 2,040 1,545  HGB 11.9* 11.6*  HCT 37 35*  PLT 270 228   Lab Results  Component Value Date   TSH 2.68 10/14/2019   Lab Results  Component Value Date   HGBA1C 5.8 06/29/2013   No results found for: CHOL, HDL, LDLCALC, LDLDIRECT, TRIG, CHOLHDL  Significant Diagnostic Results in last 30 days:  No results  found.  Assessment/Plan Essential hypertension Stable on Lopressor  Vascular dementia without behavioral disturbance (HCC) Namenda and Supportive care Depression, recurrent (HCC) On Remeron Iron deficiency anemia, unspecified iron deficiency anemia type Off Iron Repeat CBC GERD On Prilosec Chronic Bronchitis On Breo   Family/ staff Communication:   Labs/tests ordered:  CBC,CMP,TSH

## 2020-09-14 LAB — CBC AND DIFFERENTIAL
HCT: 35 — AB (ref 36–46)
Hemoglobin: 11.6 — AB (ref 12.0–16.0)
Platelets: 233 (ref 150–399)
WBC: 5.4

## 2020-09-14 LAB — HEPATIC FUNCTION PANEL
ALT: 16 (ref 7–35)
AST: 18 (ref 13–35)
Alkaline Phosphatase: 63 (ref 25–125)
Bilirubin, Total: 0.3

## 2020-09-14 LAB — TSH: TSH: 2.65 (ref 0.41–5.90)

## 2020-09-14 LAB — BASIC METABOLIC PANEL
BUN: 17 (ref 4–21)
CO2: 27 — AB (ref 13–22)
Chloride: 101 (ref 99–108)
Creatinine: 0.7 (ref <=1.1)
Glucose: 89
Potassium: 4.5 (ref 3.4–5.3)
Sodium: 135 — AB (ref 137–147)

## 2020-09-14 LAB — COMPREHENSIVE METABOLIC PANEL
Albumin: 3.4 — AB (ref 3.5–5.0)
Calcium: 8.7 (ref 8.7–10.7)
GFR calc Af Amer: 92
GFR calc non Af Amer: 79
Globulin: 2.4

## 2020-09-14 LAB — CBC: RBC: 3.64 — AB (ref 3.87–5.11)

## 2020-10-11 ENCOUNTER — Non-Acute Institutional Stay (SKILLED_NURSING_FACILITY): Payer: Medicare Other | Admitting: Nurse Practitioner

## 2020-10-11 ENCOUNTER — Encounter: Payer: Self-pay | Admitting: Nurse Practitioner

## 2020-10-11 DIAGNOSIS — M1990 Unspecified osteoarthritis, unspecified site: Secondary | ICD-10-CM

## 2020-10-11 DIAGNOSIS — R000 Tachycardia, unspecified: Secondary | ICD-10-CM

## 2020-10-11 DIAGNOSIS — F0150 Vascular dementia without behavioral disturbance: Secondary | ICD-10-CM

## 2020-10-11 DIAGNOSIS — K219 Gastro-esophageal reflux disease without esophagitis: Secondary | ICD-10-CM

## 2020-10-11 DIAGNOSIS — J410 Simple chronic bronchitis: Secondary | ICD-10-CM | POA: Diagnosis not present

## 2020-10-11 DIAGNOSIS — F339 Major depressive disorder, recurrent, unspecified: Secondary | ICD-10-CM

## 2020-10-11 NOTE — Assessment & Plan Note (Signed)
OA pain is controlled, on Tylenol 500mg  qhs and prn.

## 2020-10-11 NOTE — Assessment & Plan Note (Signed)
Her memory is preserved on Mamantine 10mg  qd.No behavioral issues, self propels w/c to get around.TSH 2.68 10/14/19

## 2020-10-11 NOTE — Assessment & Plan Note (Signed)
Heart rate is in control, on Metoprolol 50mg  qd, 25mg  qd.

## 2020-10-11 NOTE — Assessment & Plan Note (Signed)
COPD/chronic bronchitis, takes Breo Ellipta qd

## 2020-10-11 NOTE — Progress Notes (Signed)
Location:   SNF [place] Room Number: 35 Place of Service:  SNF (31) Provider: [facility] Ridhaan Dreibelbis NP  [name], MD  Patient Care Team: [name], MD as PCP - General (Internal Medicine) [name], MD as Referring Physician (Ophthalmology) Terran Klinke X, NP as Nurse Practitioner (Internal Medicine)  Extended Emergency Contact Information Primary Emergency Contact: [name] Address: [address]          [place], [place] [address] [name] of [place] Phone: [phone] Mobile Phone: [phone] Relation: Son Secondary Emergency Contact: [name] Address: [address]          [place], [place] [address] [name] of [place] Phone: [phone] Mobile Phone: [phone] Relation: Son  Code Status:  DNR Goals of care: Advanced Directive information Advanced Directives 09/12/2020  Does Patient Have a Medical Advance Directive? Yes  Type of Advance Directive Out of facility DNR (pink MOST or yellow form);Healthcare Power of Attorney  Does patient want to make changes to medical advance directive? -  Copy of [place] in Chart? Yes - validated most recent copy scanned in chart (See row information)  Would patient like information on creating a medical advance directive? -  Pre-existing out of facility DNR order (yellow form or pink MOST form) Pink MOST form placed in chart (order not valid for inpatient use);Yellow form placed in chart (order not valid for inpatient use)     Chief Complaint  Patient presents with  . Medical Management of Chronic Issues    HPI:  Pt is a 87 y.o. female seen today for medical management of chronic diseases.    Sleeps/eats at her baseline, takesMirtazapine 7.5mg  qd.  Heart rate is in control, on Metoprolol 50mg  qd, 25mg  qd.  OA pain is controlled, on Tylenol 500mg  qhs and prn. Her memory is preserved on Mamantine 10mg  qd.No behavioral  issues, self propels w/c to get around.TSH 2.65 09/21/20 GERD, stable, on Omeprazole 10mg  qd.Hgb 12.4 09/21/20 COPD/chronic bronchitis, takes Breo Ellipta qd   Past Medical History:  Diagnosis Date  . Anemia, iron deficiency    takes Ferrous Sulfate daily  . Depression    takes Effexor daily  . Gait disorder 04/21/2014  . GERD (gastroesophageal reflux disease)    takes Omeprazole daily  . Herpes ocular    history of-takes Acyclovir daily  . High cholesterol    takes Atorvastatin daily  . History of hiatal hernia   . Hypertension    takes Metoprolol daily  . Insomnia    takes Xanax nightly  . Osteoarthritis of knee    bilateral  . Sciatic pain    Past Surgical History:  Procedure Laterality Date  . ABDOMINAL HYSTERECTOMY  1995   partial  . CARPAL TUNNEL RELEASE Right 07/21/2007  . CARPAL TUNNEL RELEASE Left 09/24/2007  . COLONOSCOPY    . DESCEMETS STRIPPING AUTOMATED ENDOTHELIAL KERATOPLASTY Left 03/14/2011  . DESCEMETS STRIPPING AUTOMATED ENDOTHELIAL KERATOPLASTY Right 08/20/2012  . EYE SURGERY Bilateral    cataract surgery  . EYE SURGERY Left    corneal transplant  . HERNIA REPAIR    . HIP ARTHROPLASTY Left 06/13/2013   Procedure: ARTHROPLASTY BIPOLAR HIP; Injection left shoulder;  Surgeon: Johnny Bridge, MD;  Location: [place];  Service: Orthopedics;  Laterality: Left;  . JOINT REPLACEMENT     bilateral knees, left knee  . KNEE ARTHROSCOPY Right 05/11/2001  . KYPHOPLASTY N/A 04/10/2015   Procedure: T11 Kyphoplasty;  Surgeon: Jovita Gamma, MD;  Location:  [place] [facility];  Service: Neurosurgery;  Laterality: N/A;  T11 Kyphoplasty  . KYPHOPLASTY N/A 05/06/2015   Procedure: KYPHOPLASTY Thoracic twelve;  Surgeon: Jovita Gamma, MD;  Location: [place] [facility];  Service: Neurosurgery;  Laterality: N/A;  T12 Kyphoplasty  . LAPAROSCOPIC NISSEN FUNDOPLICATION  9/92/4268  . LUMBAR LAMINECTOMY/DECOMPRESSION MICRODISCECTOMY  08/29/2010   L2-S1  . SHOULDER  ARTHROSCOPY WITH ROTATOR CUFF REPAIR AND SUBACROMIAL DECOMPRESSION Right 01/01/2000  . TONSILLECTOMY     as child  . TOTAL HIP REVISION Left 07/08/2014   Procedure: TOTAL HIP REVISION;  Surgeon: Kerin Salen, MD;  Location: [place];  Service: Orthopedics;  Laterality: Left;  . TOTAL KNEE ARTHROPLASTY Left 05/14/2004  . TOTAL KNEE ARTHROPLASTY  12/23/2011   Procedure: TOTAL KNEE ARTHROPLASTY;  Surgeon: Lorn Junes, MD;  Location: [place];  Service: Orthopedics;  Laterality: Right;  DR [place] THIS CASE  . TRIGGER FINGER RELEASE Right 07/21/2007   thumb  . TRIGGER FINGER RELEASE Left 09/24/2007   middle finger  . TRIGGER FINGER RELEASE Right 03/10/2008   ring and little fingers  . TRIGGER FINGER RELEASE Right 09/02/2012   Procedure: RELEASE TRIGGER FINGER/A-1 PULLEY RIGHT INDEX FINGER;  Surgeon: Wynonia Sours, MD;  Location: [place];  Service: Orthopedics;  Laterality: Right;  . TRIGGER FINGER RELEASE Left 12/22/2012   Procedure: RELEASE TRIGGER FINGER/A-1 PULLEY LEFT RING FINGER;  Surgeon: Wynonia Sours, MD;  Location: [place];  Service: Orthopedics;  Laterality: Left;  Left     Allergies  Allergen Reactions  . Morphine And Related Other (See Comments)    AGITATION, STRANGE DREAMS  . Scopolamine Other (See Comments)    MENTAL CHANGES  . Atorvastatin Other (See Comments)    unknown  . Morphine Other (See Comments)    Disoriented, mood changes  . Nsaids Other (See Comments)    unknown  . Pravachol [Pravastatin Sodium] Other (See Comments)    unknown  . Pravastatin Other (See Comments)    unknown  . Teriparatide Other (See Comments)    unknown    Allergies as of 10/11/2020      Reactions   Morphine And Related Other (See Comments)   AGITATION, STRANGE DREAMS   Scopolamine Other (See Comments)   MENTAL CHANGES   Atorvastatin Other (See Comments)   unknown   Morphine Other (See Comments)   Disoriented, mood changes   Nsaids Other  (See Comments)   unknown   Pravachol [pravastatin Sodium] Other (See Comments)   unknown   Pravastatin Other (See Comments)   unknown   Teriparatide Other (See Comments)   unknown      Medication List       Accurate as of October 11, 2020 11:59 PM. If you have any questions, ask your nurse or doctor.        acetaminophen 500 MG tablet Commonly known as: TYLENOL Take 500 mg by mouth at bedtime.   Artificial Tears 0.2-0.2-1 % Soln Generic drug: Glycerin-Hypromellose-PEG 400 Place 1 drop into both eyes 4 (four) times daily.   aspirin EC 81 MG tablet Take 81 mg by mouth daily.   cholecalciferol 25 MCG (1000 UNIT) tablet Commonly known as: VITAMIN D3 Take 1,000 Units by mouth daily.   fluticasone furoate-vilanterol 100-25 MCG/INH Aepb Commonly known as: BREO ELLIPTA Inhale 1 puff into the lungs daily.   magnesium hydroxide 400 MG/5ML suspension Commonly known as: MILK OF MAGNESIA Take 15 mLs by mouth at bedtime as  needed for mild constipation.   memantine 10 MG tablet Commonly known as: NAMENDA Take 10 mg by mouth 2 (two) times daily.   metoprolol tartrate 25 MG tablet Commonly known as: LOPRESSOR Take 25 mg by mouth at bedtime.   metoprolol tartrate 50 MG tablet Commonly known as: LOPRESSOR Take 50 mg by mouth daily. DAILY 07:00 AM - 10:00 AM   mirtazapine 15 MG tablet Commonly known as: REMERON Take 7.5 mg by mouth at bedtime.   nitroGLYCERIN 0.4 MG SL tablet Commonly known as: NITROSTAT Place 0.4 mg under the tongue every 5 (five) minutes as needed for chest pain.   NON FORMULARY Special Instructions: Regular diet with thin liquids   omeprazole 10 MG capsule Commonly known as: PRILOSEC Take 10 mg by mouth daily.   Systane Nighttime Oint Apply to both eyes at bedtime       Review of Systems  Constitutional: Negative for activity change, appetite change and fever.       Gradually has gained a pound to two in the past 2-3 months.   HENT: Positive  for hearing loss. Negative for congestion and voice change.   Eyes: Negative for visual disturbance.  Respiratory: Negative for cough.   Cardiovascular: Negative for leg swelling.  Gastrointestinal: Negative for abdominal pain and constipation.  Genitourinary: Negative for dysuria and urgency.  Musculoskeletal: Positive for arthralgias, back pain and gait problem.  Skin: Negative for color change.  Neurological: Negative for dizziness, speech difficulty and weakness.       Dementia  Psychiatric/Behavioral: Positive for confusion. Negative for behavioral problems and sleep disturbance. The patient is not nervous/anxious.     Immunization History  Administered Date(s) Administered  . Influenza Whole 03/27/2018  . Influenza, High Dose Seasonal PF 03/26/2019  . Influenza-Unspecified 04/14/2017, 04/05/2020  . Moderna Sars-Covid-2 Vaccination 07/24/2019, 08/21/2019, 05/02/2020  . Pneumococcal Conjugate-13 09/24/2017  . Pneumococcal Polysaccharide-23 06/14/2013  . Td 09/25/2017  . Varicella 06/29/2003   Pertinent  Health Maintenance Due  Topic Date Due  . INFLUENZA VACCINE  01/22/2021  . DEXA SCAN  Completed  . PNA vac Low Risk Adult  Completed   Fall Risk  09/19/2017  Falls in the past year? No   Functional Status Survey:    Vitals:   10/11/20 1640  BP: 122/69  Pulse: 66  Resp: 20  Temp: (!) 97.4 F (36.3 C)  SpO2: 95%   There is no height or weight on file to calculate BMI. Physical Exam Vitals and nursing note reviewed.  Constitutional:      Appearance: Normal appearance.  HENT:     Head: Normocephalic and atraumatic.  Eyes:     Extraocular Movements: Extraocular movements intact.     Conjunctiva/sclera: Conjunctivae normal.     Pupils: Pupils are equal, round, and reactive to light.  Cardiovascular:     Rate and Rhythm: Normal rate and regular rhythm.     Heart sounds: No murmur heard.   Pulmonary:     Effort: Pulmonary effort is normal.     Breath sounds:  No rales.  Abdominal:     Palpations: Abdomen is soft.     Tenderness: There is no abdominal tenderness.  Musculoskeletal:     Cervical back: Normal range of motion and neck supple.     Right lower leg: No edema.     Left lower leg: No edema.  Skin:    General: Skin is warm and dry.  Neurological:     General: No focal deficit present.  Mental Status: She is alert. Mental status is at baseline.     Gait: Gait abnormal.     Comments: Oriented to self.   Psychiatric:        Mood and Affect: Mood normal.        Behavior: Behavior normal.     Labs reviewed: No results for input(s): NA, K, CL, CO2, GLUCOSE, BUN, CREATININE, CALCIUM, MG, PHOS in the last 8760 hours. No results for input(s): AST, ALT, ALKPHOS, BILITOT, PROT, ALBUMIN in the last 8760 hours. Recent Labs    11/25/19 0000  WBC 5.0  NEUTROABS 1,545  HGB 11.6*  HCT 35*  PLT 228   Lab Results  Component Value Date   TSH 2.68 10/14/2019   Lab Results  Component Value Date   HGBA1C 5.8 06/29/2013   No results found for: CHOL, HDL, LDLCALC, LDLDIRECT, TRIG, CHOLHDL  Significant Diagnostic Results in last 30 days:  No results found.  Assessment/Plan  Chronic bronchitis (HCC) COPD/chronic bronchitis, takes Breo Ellipta qd   GERD (gastroesophageal reflux disease) stable, on Omeprazole 10mg  qd.Hgb 11.6 11/25/19   Vascular dementia ([place]) Her memory is preserved on Mamantine 10mg  qd.No behavioral issues, self propels w/c to get around.TSH 2.68 10/14/19   Osteoarthritis OA pain is controlled, on Tylenol 500mg  qhs and prn.  Tachycardia Heart rate is in control, on Metoprolol 50mg  qd, 25mg  qd.   Depression, recurrent (HCC) Sleeps/eats at her baseline, takesMirtazapine 7.5mg  qd.     Family/ staff Communication: plan of care reviewed with the patient and charge nurse.   Labs/tests ordered: none  Time spend 35 minutes.

## 2020-10-11 NOTE — Assessment & Plan Note (Signed)
stable, on Omeprazole 10mg  qd.Hgb 11.6 11/25/19

## 2020-10-11 NOTE — Assessment & Plan Note (Signed)
Sleeps/eats at her baseline, takesMirtazapine 7.5mg  qd.

## 2020-10-13 ENCOUNTER — Encounter: Payer: Self-pay | Admitting: Nurse Practitioner

## 2020-11-03 ENCOUNTER — Encounter: Payer: Self-pay | Admitting: Nurse Practitioner

## 2020-11-03 ENCOUNTER — Non-Acute Institutional Stay (SKILLED_NURSING_FACILITY): Payer: Medicare Other | Admitting: Nurse Practitioner

## 2020-11-03 DIAGNOSIS — Z66 Do not resuscitate: Secondary | ICD-10-CM

## 2020-11-03 DIAGNOSIS — J410 Simple chronic bronchitis: Secondary | ICD-10-CM | POA: Diagnosis not present

## 2020-11-03 DIAGNOSIS — M159 Polyosteoarthritis, unspecified: Secondary | ICD-10-CM

## 2020-11-03 DIAGNOSIS — F339 Major depressive disorder, recurrent, unspecified: Secondary | ICD-10-CM

## 2020-11-03 DIAGNOSIS — F0150 Vascular dementia without behavioral disturbance: Secondary | ICD-10-CM

## 2020-11-03 DIAGNOSIS — K219 Gastro-esophageal reflux disease without esophagitis: Secondary | ICD-10-CM | POA: Diagnosis not present

## 2020-11-03 DIAGNOSIS — R000 Tachycardia, unspecified: Secondary | ICD-10-CM

## 2020-11-03 NOTE — Assessment & Plan Note (Signed)
Sleeps/eats at her baseline, takesMirtazapine 7.5mg qd.   

## 2020-11-03 NOTE — Assessment & Plan Note (Signed)
OA pain is controlled, on Tylenol 500mg qhs and prn. 

## 2020-11-03 NOTE — Assessment & Plan Note (Signed)
Heart rate is in control, on Metoprolol 50mg qd, 25mg qd.  

## 2020-11-03 NOTE — Progress Notes (Signed)
Location:   [place] Room Number: [phone] Place of Service:  SNF (31) Provider:  Charna Neeb Otho Darner, NP    Patient Care Team: [name], MD as PCP - General (Internal Medicine) [name], MD as Referring Physician (Ophthalmology) Ely Spragg X, NP as Nurse Practitioner (Internal Medicine)  Extended Emergency Contact Information Primary Emergency Contact: [name] Address: [address]          [place], [place] [address] [name] of [place] Phone: [phone] Mobile Phone: [phone] Relation: Son Secondary Emergency Contact: [name] Address: [address]          [place], [place] [address] [name] of [place] Phone: [phone] Mobile Phone: [phone] Relation: Son  Code Status:  DNR Goals of care: Advanced Directive information Advanced Directives 11/03/2020  Does Patient Have a Medical Advance Directive? Yes  Type of Paramedic of [place];Out of facility DNR (pink MOST or yellow form)  Does patient want to make changes to medical advance directive? No - Patient declined  Copy of [place] in Chart? Yes - validated most recent copy scanned in chart (See row information)  Would patient like information on creating a medical advance directive? -  Pre-existing out of facility DNR order (yellow form or pink MOST form) Yellow form placed in chart (order not valid for inpatient use);Pink MOST form placed in chart (order not valid for inpatient use)     Chief Complaint  Patient presents with  . Medical Management of Chronic Issues    Routine follow up.     HPI:  Pt is a 87 y.o. female seen today for medical management of chronic diseases.      Sleeps/eats at her baseline, takesMirtazapine 7.5mg  qd.  Heart rate is in control, on Metoprolol 50mg  qd, 25mg  qd.  OA pain is controlled, on Tylenol 500mg  qhs and prn. Her memory is  preserved on Mamantine 10mg  qd.No behavioral issues, self propels w/c to get around.TSH 2.65 09/21/20 GERD, stable, on Omeprazole 10mg  qd.Hgb 12.4 09/21/20 COPD/chronic bronchitis, takes Breo Ellipta qd   Past Medical History:  Diagnosis Date  . Anemia, iron deficiency    takes Ferrous Sulfate daily  . Depression    takes Effexor daily  . Gait disorder 04/21/2014  . GERD (gastroesophageal reflux disease)    takes Omeprazole daily  . Herpes ocular    history of-takes Acyclovir daily  . High cholesterol    takes Atorvastatin daily  . History of hiatal hernia   . Hypertension    takes Metoprolol daily  . Insomnia    takes Xanax nightly  . Osteoarthritis of knee    bilateral  . Sciatic pain    Past Surgical History:  Procedure Laterality Date  . ABDOMINAL HYSTERECTOMY  1995   partial  . CARPAL TUNNEL RELEASE Right 07/21/2007  . CARPAL TUNNEL RELEASE Left 09/24/2007  . COLONOSCOPY    . DESCEMETS STRIPPING AUTOMATED ENDOTHELIAL KERATOPLASTY Left 03/14/2011  . DESCEMETS STRIPPING AUTOMATED ENDOTHELIAL KERATOPLASTY Right 08/20/2012  . EYE SURGERY Bilateral    cataract surgery  . EYE SURGERY Left    corneal transplant  . HERNIA REPAIR    . HIP ARTHROPLASTY Left 06/13/2013   Procedure: ARTHROPLASTY BIPOLAR HIP; Injection left shoulder;  Surgeon: Johnny Bridge, MD;  Location: [facility];  Service: Orthopedics;  Laterality: Left;  . JOINT REPLACEMENT     bilateral knees, left knee  . KNEE ARTHROSCOPY Right 05/11/2001  . KYPHOPLASTY N/A 04/10/2015  Procedure: T11 Kyphoplasty;  Surgeon: Jovita Gamma, MD;  Location: [facility];  Service: Neurosurgery;  Laterality: N/A;  T11 Kyphoplasty  . KYPHOPLASTY N/A 05/06/2015   Procedure: KYPHOPLASTY Thoracic twelve;  Surgeon: Jovita Gamma, MD;  Location: [facility];  Service: Neurosurgery;  Laterality: N/A;  T12 Kyphoplasty  . LAPAROSCOPIC NISSEN FUNDOPLICATION  9/70/2637  . LUMBAR LAMINECTOMY/DECOMPRESSION  MICRODISCECTOMY  08/29/2010   L2-S1  . SHOULDER ARTHROSCOPY WITH ROTATOR CUFF REPAIR AND SUBACROMIAL DECOMPRESSION Right 01/01/2000  . TONSILLECTOMY     as child  . TOTAL HIP REVISION Left 07/08/2014   Procedure: TOTAL HIP REVISION;  Surgeon: Kerin Salen, MD;  Location: [facility];  Service: Orthopedics;  Laterality: Left;  . TOTAL KNEE ARTHROPLASTY Left 05/14/2004  . TOTAL KNEE ARTHROPLASTY  12/23/2011   Procedure: TOTAL KNEE ARTHROPLASTY;  Surgeon: Lorn Junes, MD;  Location: [facility];  Service: Orthopedics;  Laterality: Right;  DR [place] THIS CASE  . TRIGGER FINGER RELEASE Right 07/21/2007   thumb  . TRIGGER FINGER RELEASE Left 09/24/2007   middle finger  . TRIGGER FINGER RELEASE Right 03/10/2008   ring and little fingers  . TRIGGER FINGER RELEASE Right 09/02/2012   Procedure: RELEASE TRIGGER FINGER/A-1 PULLEY RIGHT INDEX FINGER;  Surgeon: Wynonia Sours, MD;  Location: [facility];  Service: Orthopedics;  Laterality: Right;  . TRIGGER FINGER RELEASE Left 12/22/2012   Procedure: RELEASE TRIGGER FINGER/A-1 PULLEY LEFT RING FINGER;  Surgeon: Wynonia Sours, MD;  Location: [facility];  Service: Orthopedics;  Laterality: Left;  Left     Allergies  Allergen Reactions  . Morphine And Related Other (See Comments)    AGITATION, STRANGE DREAMS  . Scopolamine Other (See Comments)    MENTAL CHANGES  . Atorvastatin Other (See Comments)    unknown  . Morphine Other (See Comments)    Disoriented, mood changes  . Nsaids Other (See Comments)    unknown  . Pravachol [Pravastatin Sodium] Other (See Comments)    unknown  . Pravastatin Other (See Comments)    unknown  . Teriparatide Other (See Comments)    unknown    Allergies as of 11/03/2020      Reactions   Morphine And Related Other (See Comments)   AGITATION, STRANGE DREAMS   Scopolamine Other (See Comments)   MENTAL CHANGES   Atorvastatin Other (See Comments)   unknown   Morphine Other (See  Comments)   Disoriented, mood changes   Nsaids Other (See Comments)   unknown   Pravachol [pravastatin Sodium] Other (See Comments)   unknown   Pravastatin Other (See Comments)   unknown   Teriparatide Other (See Comments)   unknown      Medication List       Accurate as of Nov 03, 2020 11:59 PM. If you have any questions, ask your nurse or doctor.        STOP taking these medications   NON FORMULARY Stopped by: Randolf Sansoucie X Vedika Dumlao, NP     TAKE these medications   acetaminophen 500 MG tablet Commonly known as: TYLENOL Take 500 mg by mouth at bedtime.   Artificial Tears 0.2-0.2-1 % Soln Generic drug: Glycerin-Hypromellose-PEG 400 Place 1 drop into both eyes 4 (four) times daily.   aspirin EC 81 MG tablet Take 81 mg by mouth daily.   cholecalciferol 25 MCG (1000 UNIT) tablet Commonly known as: VITAMIN D3 Take 1,000 Units by mouth daily.   fluticasone furoate-vilanterol 100-25 MCG/INH Aepb  Commonly known as: BREO ELLIPTA Inhale 1 puff into the lungs daily.   magnesium hydroxide 400 MG/5ML suspension Commonly known as: MILK OF MAGNESIA Take 15 mLs by mouth at bedtime as needed for mild constipation.   memantine 10 MG tablet Commonly known as: NAMENDA Take 10 mg by mouth 2 (two) times daily.   metoprolol tartrate 25 MG tablet Commonly known as: LOPRESSOR Take 25 mg by mouth at bedtime.   metoprolol tartrate 50 MG tablet Commonly known as: LOPRESSOR Take 50 mg by mouth daily. DAILY 07:00 AM - 10:00 AM   mirtazapine 15 MG tablet Commonly known as: REMERON Take 7.5 mg by mouth at bedtime.   nitroGLYCERIN 0.4 MG SL tablet Commonly known as: NITROSTAT Place 0.4 mg under the tongue every 5 (five) minutes as needed for chest pain.   omeprazole 10 MG capsule Commonly known as: PRILOSEC Take 10 mg by mouth daily.   Systane Nighttime Oint Apply to both eyes at bedtime       Review of Systems  Constitutional: Negative for fatigue, fever and unexpected weight  change.       Gradually has gained a pound to two in the past 2-3 months.   HENT: Positive for hearing loss. Negative for congestion and voice change.   Eyes: Negative for visual disturbance.  Respiratory: Negative for cough.   Cardiovascular: Negative for leg swelling.  Gastrointestinal: Negative for abdominal pain and constipation.  Genitourinary: Negative for dysuria and urgency.  Musculoskeletal: Positive for arthralgias, back pain and gait problem.  Skin: Negative for color change.  Neurological: Negative for speech difficulty, weakness and headaches.       Dementia  Psychiatric/Behavioral: Positive for confusion. Negative for behavioral problems and sleep disturbance. The patient is not nervous/anxious.     Immunization History  Administered Date(s) Administered  . Influenza Whole 03/27/2018  . Influenza, High Dose Seasonal PF 03/26/2019  . Influenza-Unspecified 04/14/2017, 04/05/2020  . Moderna Sars-Covid-2 Vaccination 07/24/2019, 08/21/2019, 05/02/2020  . Pneumococcal Conjugate-13 09/24/2017  . Pneumococcal Polysaccharide-23 06/14/2013  . Td 09/25/2017  . Varicella 06/29/2003   Pertinent  Health Maintenance Due  Topic Date Due  . INFLUENZA VACCINE  01/22/2021  . DEXA SCAN  Completed  . PNA vac Low Risk Adult  Completed   Fall Risk  09/19/2017  Falls in the past year? No   Functional Status Survey:    Vitals:   11/03/20 0914  BP: 130/65  Pulse: 72  Resp: 16  Temp: 97.7 F (36.5 C)  SpO2: 94%  Weight: 116 lb (52.6 kg)  Height: 5\' 1"  (1.549 m)   Body mass index is 21.92 kg/m. Physical Exam Vitals and nursing note reviewed.  Constitutional:      Appearance: Normal appearance.  HENT:     Head: Normocephalic and atraumatic.  Eyes:     Extraocular Movements: Extraocular movements intact.     Conjunctiva/sclera: Conjunctivae normal.     Pupils: Pupils are equal, round, and reactive to light.  Cardiovascular:     Rate and Rhythm: Normal rate and regular  rhythm.     Heart sounds: No murmur heard.   Pulmonary:     Effort: Pulmonary effort is normal.     Breath sounds: No rales.  Abdominal:     Palpations: Abdomen is soft.     Tenderness: There is no abdominal tenderness.  Musculoskeletal:     Cervical back: Normal range of motion and neck supple.     Right lower leg: No edema.  Left lower leg: No edema.  Skin:    General: Skin is warm and dry.  Neurological:     General: No focal deficit present.     Mental Status: She is alert. Mental status is at baseline.     Gait: Gait abnormal.     Comments: Oriented to self.   Psychiatric:        Mood and Affect: Mood normal.        Behavior: Behavior normal.     Labs reviewed: Recent Labs    09/14/20 0000  NA 135*  K 4.5  CL 101  CO2 27*  BUN 17  CREATININE 0.7  CALCIUM 8.7   Recent Labs    09/14/20 0000 09/14/20 2227  AST  --  18  ALT  --  16  ALKPHOS  --  63  ALBUMIN 3.4*  --    Recent Labs    11/25/19 0000 09/14/20 2227  WBC 5.0 5.4  NEUTROABS 1,545  --   HGB 11.6* 11.6*  HCT 35* 35*  PLT 228 233   Lab Results  Component Value Date   TSH 2.65 09/14/2020   Lab Results  Component Value Date   HGBA1C 5.8 06/29/2013   No results found for: CHOL, HDL, LDLCALC, LDLDIRECT, TRIG, CHOLHDL  Significant Diagnostic Results in last 30 days:  No results found.  Assessment/Plan Chronic bronchitis (HCC) Stable, bronchitis, takes Breo Ellipta qd   GERD (gastroesophageal reflux disease) stable, on Omeprazole 10mg  qd.Hgb 12.4 09/21/20   Vascular dementia ([place]) Her memory is preserved on Mamantine 10mg  qd.No behavioral issues, self propels w/c to get around.TSH 2.65 09/21/20   Osteoarthritis, multiple sites OA pain is controlled, on Tylenol 500mg  qhs and prn.  Tachycardia Heart rate is in control, on Metoprolol 50mg  qd, 25mg  qd.   Depression, recurrent (HCC) Sleeps/eats at her baseline, takesMirtazapine 7.5mg  qd.       Family/  staff Communication: plan of care reviewed with the patient and charge nurse.   Labs/tests ordered:  none  Time spend 35 minutes.

## 2020-11-03 NOTE — Assessment & Plan Note (Signed)
Her memory is preserved on Mamantine 10mg qd. No behavioral issues, self propels w/c to get around. TSH 2.65 09/21/20 

## 2020-11-03 NOTE — Assessment & Plan Note (Signed)
Stable, bronchitis, takes Breo Ellipta qd

## 2020-11-03 NOTE — Assessment & Plan Note (Signed)
stable, on Omeprazole 10mg qd. Hgb 12.4 09/21/20 

## 2020-11-07 ENCOUNTER — Encounter: Payer: Self-pay | Admitting: Nurse Practitioner

## 2020-12-12 ENCOUNTER — Non-Acute Institutional Stay (SKILLED_NURSING_FACILITY): Payer: Medicare Other | Admitting: Internal Medicine

## 2020-12-12 ENCOUNTER — Encounter: Payer: Self-pay | Admitting: Internal Medicine

## 2020-12-12 DIAGNOSIS — I10 Essential (primary) hypertension: Secondary | ICD-10-CM

## 2020-12-12 DIAGNOSIS — D509 Iron deficiency anemia, unspecified: Secondary | ICD-10-CM | POA: Diagnosis not present

## 2020-12-12 DIAGNOSIS — F0150 Vascular dementia without behavioral disturbance: Secondary | ICD-10-CM

## 2020-12-12 DIAGNOSIS — K219 Gastro-esophageal reflux disease without esophagitis: Secondary | ICD-10-CM

## 2020-12-12 DIAGNOSIS — F339 Major depressive disorder, recurrent, unspecified: Secondary | ICD-10-CM

## 2020-12-12 DIAGNOSIS — J42 Unspecified chronic bronchitis: Secondary | ICD-10-CM

## 2020-12-12 NOTE — Progress Notes (Signed)
Location:   [facility] Room Number: 42 Place of Service:  SNF [phone]) Provider:  Veleta Miners MD  [name], MD  Patient Care Team: [name], MD as PCP - General (Internal Medicine) [name], MD as Referring Physician (Ophthalmology) Mast, Man X, NP as Nurse Practitioner (Internal Medicine)  Extended Emergency Contact Information Primary Emergency Contact: [name] Address: [address]          [place], [place] [address] [name] of [place] Phone: [phone] Mobile Phone: [phone] Relation: Son Secondary Emergency Contact: [name] Address: [address]          [place], [place] [address] [name] of [place] Phone: [phone] Mobile Phone: [phone] Relation: Son  Code Status:  DNR Goals of care: Advanced Directive information Advanced Directives 12/12/2020  Does Patient Have a Medical Advance Directive? Yes  Type of Paramedic of [place];Out of facility DNR (pink MOST or yellow form)  Does patient want to make changes to medical advance directive? No - Patient declined  Copy of [place] in Chart? Yes - validated most recent copy scanned in chart (See row information)  Would patient like information on creating a medical advance directive? -  Pre-existing out of facility DNR order (yellow form or pink MOST form) Yellow form placed in chart (order not valid for inpatient use);Pink MOST form placed in chart (order not valid for inpatient use)     Chief Complaint  Patient presents with   Acute Visit   Medical Management of Chronic Issues    Shingrix    HPI:  Pt is a 87 y.o. female seen today for medical management of chronic diseases.    Patient has a history of Hypertension, anemia, depression, Hyperlipidemia , COPD, hyponatremia,Depression with Anxiety and Dementia And Covid Pneumonia  Doing well. Has gained some weight Goes around in  her Wheelchair No New Nursing issues    Past Medical History:  Diagnosis Date   Anemia, iron deficiency    takes Ferrous Sulfate daily   Depression    takes Effexor daily   Gait disorder 04/21/2014   GERD (gastroesophageal reflux disease)    takes Omeprazole daily   Herpes ocular    history of-takes Acyclovir daily   High cholesterol    takes Atorvastatin daily   History of hiatal hernia    Hypertension    takes Metoprolol daily   Insomnia    takes Xanax nightly   Osteoarthritis of knee    bilateral   Sciatic pain    Past Surgical History:  Procedure Laterality Date   ABDOMINAL HYSTERECTOMY  1995   partial   CARPAL TUNNEL RELEASE Right 07/21/2007   CARPAL TUNNEL RELEASE Left 09/24/2007   COLONOSCOPY     DESCEMETS STRIPPING AUTOMATED ENDOTHELIAL KERATOPLASTY Left 03/14/2011   DESCEMETS STRIPPING AUTOMATED ENDOTHELIAL KERATOPLASTY Right 08/20/2012   EYE SURGERY Bilateral    cataract surgery   EYE SURGERY Left    corneal transplant   HERNIA REPAIR     HIP ARTHROPLASTY Left 06/13/2013   Procedure: ARTHROPLASTY BIPOLAR HIP; Injection left shoulder;  Surgeon: Johnny Bridge, MD;  Location: [facility];  Service: Orthopedics;  Laterality: Left;   JOINT REPLACEMENT     bilateral knees, left knee   KNEE ARTHROSCOPY Right 05/11/2001   KYPHOPLASTY N/A 04/10/2015   Procedure: T11 Kyphoplasty;  Surgeon: Jovita Gamma, MD;  Location: [facility];  Service: Neurosurgery;  Laterality: N/A;  T11 Kyphoplasty  KYPHOPLASTY N/A 05/06/2015   Procedure: KYPHOPLASTY Thoracic twelve;  Surgeon: Jovita Gamma, MD;  Location: [facility];  Service: Neurosurgery;  Laterality: N/A;  T12 Kyphoplasty   LAPAROSCOPIC NISSEN FUNDOPLICATION  3/83/3383   LUMBAR LAMINECTOMY/DECOMPRESSION MICRODISCECTOMY  08/29/2010   L2-S1   SHOULDER ARTHROSCOPY WITH ROTATOR CUFF REPAIR AND SUBACROMIAL DECOMPRESSION Right 01/01/2000   TONSILLECTOMY     as child   TOTAL HIP REVISION Left 07/08/2014   Procedure: TOTAL HIP  REVISION;  Surgeon: Kerin Salen, MD;  Location: [facility];  Service: Orthopedics;  Laterality: Left;   TOTAL KNEE ARTHROPLASTY Left 05/14/2004   TOTAL KNEE ARTHROPLASTY  12/23/2011   Procedure: TOTAL KNEE ARTHROPLASTY;  Surgeon: Lorn Junes, MD;  Location: [facility];  Service: Orthopedics;  Laterality: Right;  DR Noemi Chapel WANTS 90 MINUTES FOR THIS CASE   TRIGGER FINGER RELEASE Right 07/21/2007   thumb   TRIGGER FINGER RELEASE Left 09/24/2007   middle finger   TRIGGER FINGER RELEASE Right 03/10/2008   ring and little fingers   TRIGGER FINGER RELEASE Right 09/02/2012   Procedure: RELEASE TRIGGER FINGER/A-1 PULLEY RIGHT INDEX FINGER;  Surgeon: Wynonia Sours, MD;  Location: [facility];  Service: Orthopedics;  Laterality: Right;   TRIGGER FINGER RELEASE Left 12/22/2012   Procedure: RELEASE TRIGGER FINGER/A-1 PULLEY LEFT RING FINGER;  Surgeon: Wynonia Sours, MD;  Location: [facility];  Service: Orthopedics;  Laterality: Left;  Left     Allergies  Allergen Reactions   Morphine And Related Other (See Comments)    AGITATION, STRANGE DREAMS   Scopolamine Other (See Comments)    MENTAL CHANGES   Atorvastatin Other (See Comments)    unknown   Morphine Other (See Comments)    Disoriented, mood changes   Nsaids Other (See Comments)    unknown   Pravachol [Pravastatin Sodium] Other (See Comments)    unknown   Pravastatin Other (See Comments)    unknown   Teriparatide Other (See Comments)    unknown    Allergies as of 12/12/2020       Reactions   Morphine And Related Other (See Comments)   AGITATION, STRANGE DREAMS   Scopolamine Other (See Comments)   MENTAL CHANGES   Atorvastatin Other (See Comments)   unknown   Morphine Other (See Comments)   Disoriented, mood changes   Nsaids Other (See Comments)   unknown   Pravachol [pravastatin Sodium] Other (See Comments)   unknown   Pravastatin Other (See Comments)   unknown   Teriparatide Other (See Comments)   unknown         Medication List        Accurate as of December 12, 2020  3:48 PM. If you have any questions, ask your nurse or doctor.          acetaminophen 500 MG tablet Commonly known as: TYLENOL Take 500 mg by mouth at bedtime.   Artificial Tears 0.2-0.2-1 % Soln Generic drug: Glycerin-Hypromellose-PEG 400 Place 1 drop into both eyes 4 (four) times daily.   aspirin EC 81 MG tablet Take 81 mg by mouth daily.   cholecalciferol 25 MCG (1000 UNIT) tablet Commonly known as: VITAMIN D3 Take 1,000 Units by mouth daily.   fluticasone furoate-vilanterol 100-25 MCG/INH Aepb Commonly known as: BREO ELLIPTA Inhale 1 puff into the lungs daily.   magnesium hydroxide 400 MG/5ML suspension Commonly known as: MILK OF MAGNESIA Take 15 mLs by mouth at bedtime as needed for mild constipation.   memantine  10 MG tablet Commonly known as: NAMENDA Take 10 mg by mouth 2 (two) times daily.   metoprolol tartrate 25 MG tablet Commonly known as: LOPRESSOR Take 25 mg by mouth at bedtime.   metoprolol tartrate 50 MG tablet Commonly known as: LOPRESSOR Take 50 mg by mouth daily. DAILY 07:00 AM - 10:00 AM   mirtazapine 15 MG tablet Commonly known as: REMERON Take 7.5 mg by mouth at bedtime.   nitroGLYCERIN 0.4 MG SL tablet Commonly known as: NITROSTAT Place 0.4 mg under the tongue every 5 (five) minutes as needed for chest pain.   omeprazole 10 MG capsule Commonly known as: PRILOSEC Take 10 mg by mouth daily.   Systane Nighttime Oint Apply to both eyes at bedtime        Review of Systems  Unable to perform ROS: Dementia   Immunization History  Administered Date(s) Administered   Influenza Whole 03/27/2018   Influenza, High Dose Seasonal PF 03/26/2019   Influenza-Unspecified 04/14/2017, 04/05/2020   Moderna SARS-COV2 Booster Vaccination 11/21/2020   Moderna Sars-Covid-2 Vaccination 07/24/2019, 08/21/2019, 05/02/2020   Pneumococcal Conjugate-13 09/24/2017   Pneumococcal  Polysaccharide-23 06/14/2013   Td 09/25/2017   Varicella 06/29/2003   Pertinent  Health Maintenance Due  Topic Date Due   INFLUENZA VACCINE  01/22/2021   DEXA SCAN  Completed   PNA vac Low Risk Adult  Completed   Fall Risk  09/19/2017  Falls in the past year? No   Functional Status Survey:    Vitals:   12/12/20 1544  BP: (!) 107/58  Pulse: 92  Resp: 19  Temp: 97.9 F (36.6 C)  SpO2: 97%  Weight: 120 lb (54.4 kg)  Height: 5\' 1"  (1.549 m)   Body mass index is 22.67 kg/m. Physical Exam Constitutional:  Well-developed and well-nourished.  HENT:  Head: Normocephalic.  Mouth/Throat: Oropharynx is clear and moist.  Eyes: Pupils are equal, round, and reactive to light.  Neck: Neck supple.  Cardiovascular: Normal rate and normal heart sounds.  No murmur heard. Pulmonary/Chest: Effort normal and breath sounds normal. No respiratory distress. No wheezes. She has no rales.  Abdominal: Soft. Bowel sounds are normal. No distension. There is no tenderness. There is no rebound.  Musculoskeletal: No edema.  Lymphadenopathy: none Neurological: Stays in Veterinary surgeon commands. Alert but not Oriented  Skin: Skin is warm and dry.  Psychiatric: Normal mood and affect. Behavior is normal. Thought content normal.   Labs reviewed: Recent Labs    09/14/20 0000  NA 135*  K 4.5  CL 101  CO2 27*  BUN 17  CREATININE 0.7  CALCIUM 8.7   Recent Labs    09/14/20 0000 09/14/20 2227  AST  --  18  ALT  --  16  ALKPHOS  --  63  ALBUMIN 3.4*  --    Recent Labs    09/14/20 2227  WBC 5.4  HGB 11.6*  HCT 35*  PLT 233   Lab Results  Component Value Date   TSH 2.65 09/14/2020   Lab Results  Component Value Date   HGBA1C 5.8 06/29/2013   No results found for: CHOL, HDL, LDLCALC, LDLDIRECT, TRIG, CHOLHDL  Significant Diagnostic Results in last 30 days:  No results found.  Assessment/Plan Essential hypertension BP running low Will change Lopressor to 25 mg  BID Vascular dementia without behavioral disturbance (HCC) On Namenda Doing well Depression, recurrent (HCC) On Remeron Iron deficiency anemia, unspecified iron deficiency anemia type Hgb stable off Iron Gastroesophageal reflux disease, unspecified whether esophagitis  present On Prilosec Chronic bronchitis, unspecified chronic bronchitis type ([place]) On Breo   Family/ staff Communication:   Labs/tests ordered:

## 2020-12-28 ENCOUNTER — Non-Acute Institutional Stay (SKILLED_NURSING_FACILITY): Payer: Medicare Other | Admitting: Nurse Practitioner

## 2020-12-28 ENCOUNTER — Encounter: Payer: Self-pay | Admitting: Nurse Practitioner

## 2020-12-28 DIAGNOSIS — J410 Simple chronic bronchitis: Secondary | ICD-10-CM

## 2020-12-28 DIAGNOSIS — M159 Polyosteoarthritis, unspecified: Secondary | ICD-10-CM

## 2020-12-28 DIAGNOSIS — H0014 Chalazion left upper eyelid: Secondary | ICD-10-CM | POA: Diagnosis not present

## 2020-12-28 DIAGNOSIS — F339 Major depressive disorder, recurrent, unspecified: Secondary | ICD-10-CM | POA: Diagnosis not present

## 2020-12-28 DIAGNOSIS — R000 Tachycardia, unspecified: Secondary | ICD-10-CM

## 2020-12-28 DIAGNOSIS — F0150 Vascular dementia without behavioral disturbance: Secondary | ICD-10-CM

## 2020-12-28 DIAGNOSIS — K219 Gastro-esophageal reflux disease without esophagitis: Secondary | ICD-10-CM

## 2020-12-28 NOTE — Assessment & Plan Note (Signed)
stable, on Omeprazole 10mg  qd. Hgb 12.4 09/21/20

## 2020-12-28 NOTE — Assessment & Plan Note (Signed)
OA pain is controlled, on Tylenol 500mg  qhs and prn.

## 2020-12-28 NOTE — Progress Notes (Signed)
Location:   [facility] Room Number: [phone] Place of Service:  SNF (31) Provider:  Theressa Piedra Otho Darner, NP    Patient Care Team: [name], MD as PCP - General (Internal Medicine) [name], MD as Referring Physician (Ophthalmology) Ashtyn Freilich X, NP as Nurse Practitioner (Internal Medicine)  Extended Emergency Contact Information Primary Emergency Contact: [name] Address: [address]          [place], [place] [address] [name] of [place] Phone: [phone] Mobile Phone: [phone] Relation: Son Secondary Emergency Contact: [name] Address: [address]          [place],  [address] [name] of [place] Phone: [phone] Mobile Phone: [phone] Relation: Son  Code Status:  DNR Goals of care: Advanced Directive information Advanced Directives 12/28/2020  Does Patient Have a Medical Advance Directive? Yes  Type of Paramedic of [place];Out of facility DNR (pink MOST or yellow form)  Does patient want to make changes to medical advance directive? No - Patient declined  Copy of [place] in Chart? Yes - validated most recent copy scanned in chart (See row information)  Would patient like information on creating a medical advance directive? -  Pre-existing out of facility DNR order (yellow form or pink MOST form) Yellow form placed in chart (order not valid for inpatient use)     Chief Complaint  Patient presents with   Acute Visit    Patient reported left upper eyelid appears red with lump    HPI:  Pt is a 87 y.o. female seen today for an acute visit for left upper eyelid appears red with lump. The patient denied pain when palpated or change of vision.   Sleeps/eats at her baseline, takesMirtazapine 7.5mg  qd.             Heart rate is in control, on Metoprolol 25mg  bid             OA pain is controlled, on Tylenol 500mg  qhs and prn.              Her  memory is preserved on Mamantine 10mg  qd. No behavioral issues, self propels w/c to get around. TSH 2.65 09/21/20             GERD, stable, on Omeprazole 10mg  qd. Hgb 12.4 09/21/20             COPD/chronic bronchitis, takes Breo Ellipta qd  Past Medical History:  Diagnosis Date   Anemia, iron deficiency    takes Ferrous Sulfate daily   Depression    takes Effexor daily   Gait disorder 04/21/2014   GERD (gastroesophageal reflux disease)    takes Omeprazole daily   Herpes ocular    history of-takes Acyclovir daily   High cholesterol    takes Atorvastatin daily   History of hiatal hernia    Hypertension    takes Metoprolol daily   Insomnia    takes Xanax nightly   Osteoarthritis of knee    bilateral   Sciatic pain    Past Surgical History:  Procedure Laterality Date   ABDOMINAL HYSTERECTOMY  1995   partial   CARPAL TUNNEL RELEASE Right 07/21/2007   CARPAL TUNNEL RELEASE Left 09/24/2007   COLONOSCOPY     DESCEMETS STRIPPING AUTOMATED ENDOTHELIAL KERATOPLASTY Left 03/14/2011   DESCEMETS STRIPPING AUTOMATED ENDOTHELIAL KERATOPLASTY Right 08/20/2012   EYE SURGERY Bilateral    cataract surgery   EYE SURGERY Left  corneal transplant   HERNIA REPAIR     HIP ARTHROPLASTY Left 06/13/2013   Procedure: ARTHROPLASTY BIPOLAR HIP; Injection left shoulder;  Surgeon: Johnny Bridge, MD;  Location: [facility];  Service: Orthopedics;  Laterality: Left;   JOINT REPLACEMENT     bilateral knees, left knee   KNEE ARTHROSCOPY Right 05/11/2001   KYPHOPLASTY N/A 04/10/2015   Procedure: T11 Kyphoplasty;  Surgeon: Jovita Gamma, MD;  Location: [facility];  Service: Neurosurgery;  Laterality: N/A;  T11 Kyphoplasty   KYPHOPLASTY N/A 05/06/2015   Procedure: KYPHOPLASTY Thoracic twelve;  Surgeon: Jovita Gamma, MD;  Location: [facility];  Service: Neurosurgery;  Laterality: N/A;  T12 Kyphoplasty   LAPAROSCOPIC NISSEN FUNDOPLICATION  5/36/6440   LUMBAR LAMINECTOMY/DECOMPRESSION MICRODISCECTOMY   08/29/2010   L2-S1   SHOULDER ARTHROSCOPY WITH ROTATOR CUFF REPAIR AND SUBACROMIAL DECOMPRESSION Right 01/01/2000   TONSILLECTOMY     as child   TOTAL HIP REVISION Left 07/08/2014   Procedure: TOTAL HIP REVISION;  Surgeon: Kerin Salen, MD;  Location: [facility];  Service: Orthopedics;  Laterality: Left;   TOTAL KNEE ARTHROPLASTY Left 05/14/2004   TOTAL KNEE ARTHROPLASTY  12/23/2011   Procedure: TOTAL KNEE ARTHROPLASTY;  Surgeon: Lorn Junes, MD;  Location: [facility];  Service: Orthopedics;  Laterality: Right;  DR Noemi Chapel WANTS 90 MINUTES FOR THIS CASE   TRIGGER FINGER RELEASE Right 07/21/2007   thumb   TRIGGER FINGER RELEASE Left 09/24/2007   middle finger   TRIGGER FINGER RELEASE Right 03/10/2008   ring and little fingers   TRIGGER FINGER RELEASE Right 09/02/2012   Procedure: RELEASE TRIGGER FINGER/A-1 PULLEY RIGHT INDEX FINGER;  Surgeon: Wynonia Sours, MD;  Location: [facility];  Service: Orthopedics;  Laterality: Right;   TRIGGER FINGER RELEASE Left 12/22/2012   Procedure: RELEASE TRIGGER FINGER/A-1 PULLEY LEFT RING FINGER;  Surgeon: Wynonia Sours, MD;  Location: [facility];  Service: Orthopedics;  Laterality: Left;  Left     Allergies  Allergen Reactions   Morphine And Related Other (See Comments)    AGITATION, STRANGE DREAMS   Scopolamine Other (See Comments)    MENTAL CHANGES   Atorvastatin Other (See Comments)    unknown   Morphine Other (See Comments)    Disoriented, mood changes   Nsaids Other (See Comments)    unknown   Pravachol [Pravastatin Sodium] Other (See Comments)    unknown   Pravastatin Other (See Comments)    unknown   Teriparatide Other (See Comments)    unknown    Allergies as of 12/28/2020       Reactions   Morphine And Related Other (See Comments)   AGITATION, STRANGE DREAMS   Scopolamine Other (See Comments)   MENTAL CHANGES   Atorvastatin Other (See Comments)   unknown   Morphine Other (See Comments)   Disoriented, mood changes    Nsaids Other (See Comments)   unknown   Pravachol [pravastatin Sodium] Other (See Comments)   unknown   Pravastatin Other (See Comments)   unknown   Teriparatide Other (See Comments)   unknown        Medication List        Accurate as of December 28, 2020  3:42 PM. If you have any questions, ask your nurse or doctor.          acetaminophen 500 MG tablet Commonly known as: TYLENOL Take 500 mg by mouth at bedtime.   Artificial Tears 0.2-0.2-1 % Soln Generic drug: Glycerin-Hypromellose-PEG 400 Place  1 drop into both eyes 4 (four) times daily.   aspirin EC 81 MG tablet Take 81 mg by mouth daily.   cholecalciferol 25 MCG (1000 UNIT) tablet Commonly known as: VITAMIN D3 Take 1,000 Units by mouth daily.   fluticasone furoate-vilanterol 100-25 MCG/INH Aepb Commonly known as: BREO ELLIPTA Inhale 1 puff into the lungs daily.   magnesium hydroxide 400 MG/5ML suspension Commonly known as: MILK OF MAGNESIA Take 15 mLs by mouth at bedtime as needed for mild constipation.   memantine 10 MG tablet Commonly known as: NAMENDA Take 10 mg by mouth 2 (two) times daily.   metoprolol tartrate 25 MG tablet Commonly known as: LOPRESSOR Take 25 mg by mouth 2 (two) times daily.   mirtazapine 15 MG tablet Commonly known as: REMERON Take 7.5 mg by mouth at bedtime.   nitroGLYCERIN 0.4 MG SL tablet Commonly known as: NITROSTAT Place 0.4 mg under the tongue every 5 (five) minutes as needed for chest pain.   omeprazole 10 MG capsule Commonly known as: PRILOSEC Take 10 mg by mouth daily.   Systane Nighttime Oint Apply to both eyes at bedtime        Review of Systems  Constitutional:  Negative for activity change, appetite change and fever.       Gradually has gained a pound to two in the past 2-3 months.   HENT:  Positive for hearing loss. Negative for congestion and voice change.   Eyes:  Negative for pain, discharge, redness, itching and visual disturbance.       Left  upper eyelid small lump.   Respiratory:  Negative for cough.   Cardiovascular:  Negative for leg swelling.  Gastrointestinal:  Negative for abdominal pain and constipation.  Genitourinary:  Negative for dysuria and urgency.  Musculoskeletal:  Positive for arthralgias, back pain and gait problem.  Skin:  Negative for color change.  Neurological:  Negative for speech difficulty, weakness and headaches.       Dementia  Psychiatric/Behavioral:  Positive for confusion. Negative for behavioral problems and sleep disturbance. The patient is not nervous/anxious.    Immunization History  Administered Date(s) Administered   Influenza Whole 03/27/2018   Influenza, High Dose Seasonal PF 03/26/2019   Influenza-Unspecified 04/14/2017, 04/05/2020   Moderna SARS-COV2 Booster Vaccination 11/21/2020   Moderna Sars-Covid-2 Vaccination 07/24/2019, 08/21/2019, 05/02/2020   Pneumococcal Conjugate-13 09/24/2017   Pneumococcal Polysaccharide-23 06/14/2013   Td 09/25/2017   Varicella 06/29/2003   Pertinent  Health Maintenance Due  Topic Date Due   INFLUENZA VACCINE  01/22/2021   DEXA SCAN  Completed   PNA vac Low Risk Adult  Completed   Fall Risk  09/19/2017  Falls in the past year? No   Functional Status Survey:    Vitals:   12/28/20 1415  BP: 134/74  Pulse: 80  Resp: 18  Temp: 98.1 F (36.7 C)  SpO2: 97%  Weight: 111 lb 11.2 oz (50.7 kg)  Height: 5\' 1"  (1.549 m)   Body mass index is 21.11 kg/m. Physical Exam Vitals and nursing note reviewed.  Constitutional:      Appearance: Normal appearance.  HENT:     Head: Normocephalic and atraumatic.  Eyes:     General:        Right eye: No discharge.        Left eye: No discharge.     Extraocular Movements: Extraocular movements intact.     Conjunctiva/sclera: Conjunctivae normal.     Pupils: Pupils are equal, round, and reactive to light.  Comments: 2 small slightly red painless cysts like lumps left upper eyelid.   Cardiovascular:      Rate and Rhythm: Normal rate and regular rhythm.     Heart sounds: No murmur heard. Pulmonary:     Effort: Pulmonary effort is normal.     Breath sounds: No rales.  Abdominal:     Palpations: Abdomen is soft.     Tenderness: There is no abdominal tenderness.  Musculoskeletal:     Cervical back: Normal range of motion and neck supple.     Right lower leg: No edema.     Left lower leg: No edema.  Skin:    General: Skin is warm and dry.  Neurological:     General: No focal deficit present.     Mental Status: She is alert. Mental status is at baseline.     Gait: Gait abnormal.     Comments: Oriented to self.   Psychiatric:        Mood and Affect: Mood normal.        Behavior: Behavior normal.    Labs reviewed: Recent Labs    09/14/20 0000  NA 135*  K 4.5  CL 101  CO2 27*  BUN 17  CREATININE 0.7  CALCIUM 8.7   Recent Labs    09/14/20 0000 09/14/20 2227  AST  --  18  ALT  --  16  ALKPHOS  --  63  ALBUMIN 3.4*  --    Recent Labs    09/14/20 2227  WBC 5.4  HGB 11.6*  HCT 35*  PLT 233   Lab Results  Component Value Date   TSH 2.65 09/14/2020   Lab Results  Component Value Date   HGBA1C 5.8 06/29/2013   No results found for: CHOL, HDL, LDLCALC, LDLDIRECT, TRIG, CHOLHDL  Significant Diagnostic Results in last 30 days:  No results found.  Assessment/Plan [place] left upper eyelid eft upper eyelid appears red with lump. The patient denied pain when palpated or change of vision. Will warm compress 72min/each qid x 1wk, 1/2 baby shampoo +1/2 warm water to cleans eyelids/eyelashes qam.   Depression, recurrent (HCC) Sleeps/eats at her baseline, takesMirtazapine 7.5mg  qd.  Tachycardia Heart rate is in control, on Metoprolol 25mg  bid  Osteoarthritis, multiple sites OA pain is controlled, on Tylenol 500mg  qhs and prn.   Vascular dementia [facility]) Her memory is preserved on Mamantine 10mg  qd. No behavioral issues, self propels w/c to get around. TSH 2.65  09/21/20  GERD (gastroesophageal reflux disease) stable, on Omeprazole 10mg  qd. Hgb 12.4 09/21/20  Chronic bronchitis (HCC) COPD/chronic bronchitis, takes Breo Ellipta qd    Family/ staff Communication: plan of care reviewed with the patient and charge nurse.   Labs/tests ordered:  none  Time spend 35 minutes.

## 2020-12-28 NOTE — Assessment & Plan Note (Signed)
eft upper eyelid appears red with lump. The patient denied pain when palpated or change of vision. Will warm compress 52min/each qid x 1wk, 1/2 baby shampoo +1/2 warm water to cleans eyelids/eyelashes qam.

## 2020-12-28 NOTE — Assessment & Plan Note (Signed)
Sleeps/eats at her baseline, takesMirtazapine 7.5mg  qd.

## 2020-12-28 NOTE — Assessment & Plan Note (Signed)
COPD/chronic bronchitis, takes Breo Ellipta qd

## 2020-12-28 NOTE — Assessment & Plan Note (Signed)
Her memory is preserved on Mamantine 10mg  qd. No behavioral issues, self propels w/c to get around. TSH 2.65 09/21/20

## 2020-12-28 NOTE — Assessment & Plan Note (Signed)
Heart rate is in control, on Metoprolol 25mg  bid

## 2021-01-05 ENCOUNTER — Encounter: Payer: Self-pay | Admitting: Nurse Practitioner

## 2021-01-05 ENCOUNTER — Non-Acute Institutional Stay (SKILLED_NURSING_FACILITY): Payer: Medicare Other | Admitting: Nurse Practitioner

## 2021-01-05 DIAGNOSIS — R000 Tachycardia, unspecified: Secondary | ICD-10-CM

## 2021-01-05 DIAGNOSIS — F339 Major depressive disorder, recurrent, unspecified: Secondary | ICD-10-CM | POA: Diagnosis not present

## 2021-01-05 DIAGNOSIS — F0150 Vascular dementia without behavioral disturbance: Secondary | ICD-10-CM

## 2021-01-05 DIAGNOSIS — M159 Polyosteoarthritis, unspecified: Secondary | ICD-10-CM

## 2021-01-05 DIAGNOSIS — H0014 Chalazion left upper eyelid: Secondary | ICD-10-CM | POA: Diagnosis not present

## 2021-01-05 DIAGNOSIS — J410 Simple chronic bronchitis: Secondary | ICD-10-CM

## 2021-01-05 DIAGNOSIS — K219 Gastro-esophageal reflux disease without esophagitis: Secondary | ICD-10-CM

## 2021-01-05 NOTE — Assessment & Plan Note (Signed)
Heart rate is in control, on Metoprolol 25mg  bid

## 2021-01-05 NOTE — Assessment & Plan Note (Signed)
stable, on Omeprazole 10mg  qd. Hgb 12.4 09/21/20

## 2021-01-05 NOTE — Assessment & Plan Note (Signed)
OA pain is controlled, on Tylenol 500mg  qhs and prn.

## 2021-01-05 NOTE — Assessment & Plan Note (Signed)
Her memory is preserved on Mamantine 10mg  qd. No behavioral issues, self propels w/c to get around. TSH 2.65 09/21/20

## 2021-01-05 NOTE — Progress Notes (Signed)
Location:   [facility] Room Number: [phone] Place of Service:  SNF (31) Provider:  Erin Obando Otho Darner, NP    Patient Care Team: [name], MD as PCP - General (Internal Medicine) [name], MD as Referring Physician (Ophthalmology) Jadiel Schmieder X, NP as Nurse Practitioner (Internal Medicine)  Extended Emergency Contact Information Primary Emergency Contact: [name] Address: [address]          [place], [place] [address] [name] of [place] Phone: [phone] Mobile Phone: [phone] Relation: Son Secondary Emergency Contact: [name] Address: [address]          [place], [place] [address] [name] of [place] Phone: [phone] Mobile Phone: [phone] Relation: Son  Code Status:  DNR Goals of care: Advanced Directive information Advanced Directives 01/05/2021  Does Patient Have a Medical Advance Directive? Yes  Type of Advance Directive [place]  Does patient want to make changes to medical advance directive? No - Patient declined  Copy of [place] in Chart? Yes - validated most recent copy scanned in chart (See row information)  Would patient like information on creating a medical advance directive? -  Pre-existing out of facility DNR order (yellow form or pink MOST form) Yellow form placed in chart (order not valid for inpatient use);Pink MOST form placed in chart (order not valid for inpatient use)     Chief Complaint  Patient presents with   Medical Management of Chronic Issues    Routine follow up   Health Maintenance    Discuss need for shingles vaccine    HPI:  Pt is a 87 y.o. female seen today for medical management of chronic diseases.     left upper eyelid appears red with lump. The patient denied pain when palpated or change of vision.              Sleeps/eats at her baseline, takesMirtazapine 7.5mg  qd.             Heart rate is in control, on  Metoprolol 25mg  bid             OA pain is controlled, on Tylenol 500mg  qhs and prn.              Her memory is preserved on Mamantine 10mg  qd. No behavioral issues, self propels w/c to get around. TSH 2.65 09/21/20             GERD, stable, on Omeprazole 10mg  qd. Hgb 12.4 09/21/20             COPD/chronic bronchitis, takes Breo Ellipta qd  Past Medical History:  Diagnosis Date   Anemia, iron deficiency    takes Ferrous Sulfate daily   Depression    takes Effexor daily   Gait disorder 04/21/2014   GERD (gastroesophageal reflux disease)    takes Omeprazole daily   Herpes ocular    history of-takes Acyclovir daily   High cholesterol    takes Atorvastatin daily   History of hiatal hernia    Hypertension    takes Metoprolol daily   Insomnia    takes Xanax nightly   Osteoarthritis of knee    bilateral   Sciatic pain    Past Surgical History:  Procedure Laterality Date   ABDOMINAL HYSTERECTOMY  1995   partial   CARPAL TUNNEL RELEASE Right 07/21/2007   CARPAL TUNNEL RELEASE Left 09/24/2007   COLONOSCOPY     DESCEMETS STRIPPING AUTOMATED ENDOTHELIAL  KERATOPLASTY Left 03/14/2011   DESCEMETS STRIPPING AUTOMATED ENDOTHELIAL KERATOPLASTY Right 08/20/2012   EYE SURGERY Bilateral    cataract surgery   EYE SURGERY Left    corneal transplant   HERNIA REPAIR     HIP ARTHROPLASTY Left 06/13/2013   Procedure: ARTHROPLASTY BIPOLAR HIP; Injection left shoulder;  Surgeon: Johnny Bridge, MD;  Location: [facility];  Service: Orthopedics;  Laterality: Left;   JOINT REPLACEMENT     bilateral knees, left knee   KNEE ARTHROSCOPY Right 05/11/2001   KYPHOPLASTY N/A 04/10/2015   Procedure: T11 Kyphoplasty;  Surgeon: Jovita Gamma, MD;  Location: [facility];  Service: Neurosurgery;  Laterality: N/A;  T11 Kyphoplasty   KYPHOPLASTY N/A 05/06/2015   Procedure: KYPHOPLASTY Thoracic twelve;  Surgeon: Jovita Gamma, MD;  Location: [facility];  Service: Neurosurgery;  Laterality: N/A;  T12 Kyphoplasty    LAPAROSCOPIC NISSEN FUNDOPLICATION  5/80/9983   LUMBAR LAMINECTOMY/DECOMPRESSION MICRODISCECTOMY  08/29/2010   L2-S1   SHOULDER ARTHROSCOPY WITH ROTATOR CUFF REPAIR AND SUBACROMIAL DECOMPRESSION Right 01/01/2000   TONSILLECTOMY     as child   TOTAL HIP REVISION Left 07/08/2014   Procedure: TOTAL HIP REVISION;  Surgeon: Kerin Salen, MD;  Location: [facility];  Service: Orthopedics;  Laterality: Left;   TOTAL KNEE ARTHROPLASTY Left 05/14/2004   TOTAL KNEE ARTHROPLASTY  12/23/2011   Procedure: TOTAL KNEE ARTHROPLASTY;  Surgeon: Lorn Junes, MD;  Location: [facility];  Service: Orthopedics;  Laterality: Right;  DR Noemi Chapel WANTS 90 MINUTES FOR THIS CASE   TRIGGER FINGER RELEASE Right 07/21/2007   thumb   TRIGGER FINGER RELEASE Left 09/24/2007   middle finger   TRIGGER FINGER RELEASE Right 03/10/2008   ring and little fingers   TRIGGER FINGER RELEASE Right 09/02/2012   Procedure: RELEASE TRIGGER FINGER/A-1 PULLEY RIGHT INDEX FINGER;  Surgeon: Wynonia Sours, MD;  Location: [facility];  Service: Orthopedics;  Laterality: Right;   TRIGGER FINGER RELEASE Left 12/22/2012   Procedure: RELEASE TRIGGER FINGER/A-1 PULLEY LEFT RING FINGER;  Surgeon: Wynonia Sours, MD;  Location: [facility];  Service: Orthopedics;  Laterality: Left;  Left     Allergies  Allergen Reactions   Morphine And Related Other (See Comments)    AGITATION, STRANGE DREAMS   Scopolamine Other (See Comments)    MENTAL CHANGES   Atorvastatin Other (See Comments)    unknown   Morphine Other (See Comments)    Disoriented, mood changes   Nsaids Other (See Comments)    unknown   Pravachol [Pravastatin Sodium] Other (See Comments)    unknown   Pravastatin Other (See Comments)    unknown   Teriparatide Other (See Comments)    unknown    Allergies as of 01/05/2021       Reactions   Morphine And Related Other (See Comments)   AGITATION, STRANGE DREAMS   Scopolamine Other (See Comments)   MENTAL CHANGES    Atorvastatin Other (See Comments)   unknown   Morphine Other (See Comments)   Disoriented, mood changes   Nsaids Other (See Comments)   unknown   Pravachol [pravastatin Sodium] Other (See Comments)   unknown   Pravastatin Other (See Comments)   unknown   Teriparatide Other (See Comments)   unknown        Medication List        Accurate as of January 05, 2021 11:59 PM. If you have any questions, ask your nurse or doctor.  acetaminophen 500 MG tablet Commonly known as: TYLENOL Take 500 mg by mouth at bedtime.   Artificial Tears 0.2-0.2-1 % Soln Generic drug: Glycerin-Hypromellose-PEG 400 Place 1 drop into both eyes 4 (four) times daily.   aspirin EC 81 MG tablet Take 81 mg by mouth daily.   cholecalciferol 25 MCG (1000 UNIT) tablet Commonly known as: VITAMIN D3 Take 1,000 Units by mouth daily.   fluticasone furoate-vilanterol 100-25 MCG/INH Aepb Commonly known as: BREO ELLIPTA Inhale 1 puff into the lungs daily.   magnesium hydroxide 400 MG/5ML suspension Commonly known as: MILK OF MAGNESIA Take 15 mLs by mouth at bedtime as needed for mild constipation.   memantine 10 MG tablet Commonly known as: NAMENDA Take 10 mg by mouth 2 (two) times daily.   metoprolol tartrate 25 MG tablet Commonly known as: LOPRESSOR Take 25 mg by mouth 2 (two) times daily.   mirtazapine 15 MG tablet Commonly known as: REMERON Take 7.5 mg by mouth at bedtime.   nitroGLYCERIN 0.4 MG SL tablet Commonly known as: NITROSTAT Place 0.4 mg under the tongue every 5 (five) minutes as needed for chest pain.   omeprazole 10 MG capsule Commonly known as: PRILOSEC Take 10 mg by mouth daily.   Systane Nighttime Oint Apply to both eyes at bedtime        Review of Systems  Constitutional:  Negative for activity change, appetite change and fever.  HENT:  Positive for hearing loss. Negative for congestion and voice change.   Eyes:  Negative for visual disturbance.       Left  upper eyelid small bump  Respiratory:  Negative for cough.   Cardiovascular:  Negative for leg swelling.  Gastrointestinal:  Negative for abdominal pain and constipation.  Genitourinary:  Negative for dysuria and urgency.  Musculoskeletal:  Positive for arthralgias, back pain and gait problem.  Skin:  Negative for color change.  Neurological:  Negative for speech difficulty, weakness and headaches.       Dementia  Psychiatric/Behavioral:  Positive for confusion. Negative for behavioral problems and sleep disturbance. The patient is not nervous/anxious.    Immunization History  Administered Date(s) Administered   Influenza Whole 03/27/2018   Influenza, High Dose Seasonal PF 03/26/2019   Influenza-Unspecified 04/14/2017, 04/05/2020   Moderna SARS-COV2 Booster Vaccination 11/21/2020   Moderna Sars-Covid-2 Vaccination 07/24/2019, 08/21/2019, 05/02/2020   Pneumococcal Conjugate-13 09/24/2017   Pneumococcal Polysaccharide-23 06/14/2013   Td 09/25/2017   Varicella 06/29/2003   Pertinent  Health Maintenance Due  Topic Date Due   INFLUENZA VACCINE  01/22/2021   DEXA SCAN  Completed   PNA vac Low Risk Adult  Completed   Fall Risk  09/19/2017  Falls in the past year? No   Functional Status Survey:    Vitals:   01/05/21 1029  BP: 118/68  Pulse: 99  Resp: 18  Temp: 98.5 F (36.9 C)  SpO2: 95%  Weight: 111 lb 12.8 oz (50.7 kg)  Height: 5\' 1"  (1.549 m)   Body mass index is 21.12 kg/m. Physical Exam Vitals and nursing note reviewed.  Constitutional:      Appearance: Normal appearance.  HENT:     Head: Normocephalic and atraumatic.  Eyes:     Extraocular Movements: Extraocular movements intact.     Conjunctiva/sclera: Conjunctivae normal.     Pupils: Pupils are equal, round, and reactive to light.     Comments: 2 small slightly red painless cysts like bumps left upper eyelid.   Cardiovascular:     Rate and  Rhythm: Normal rate and regular rhythm.     Heart sounds: No murmur  heard. Pulmonary:     Effort: Pulmonary effort is normal.     Breath sounds: No rales.  Abdominal:     Palpations: Abdomen is soft.     Tenderness: There is no abdominal tenderness.  Musculoskeletal:     Cervical back: Normal range of motion and neck supple.     Right lower leg: No edema.     Left lower leg: No edema.  Skin:    General: Skin is warm and dry.  Neurological:     General: No focal deficit present.     Mental Status: She is alert. Mental status is at baseline.     Gait: Gait abnormal.     Comments: Oriented to self.   Psychiatric:        Mood and Affect: Mood normal.        Behavior: Behavior normal.    Labs reviewed: Recent Labs    09/14/20 0000  NA 135*  K 4.5  CL 101  CO2 27*  BUN 17  CREATININE 0.7  CALCIUM 8.7   Recent Labs    09/14/20 0000 09/14/20 2227  AST  --  18  ALT  --  16  ALKPHOS  --  63  ALBUMIN 3.4*  --    Recent Labs    09/14/20 2227  WBC 5.4  HGB 11.6*  HCT 35*  PLT 233   Lab Results  Component Value Date   TSH 2.65 09/14/2020   Lab Results  Component Value Date   HGBA1C 5.8 06/29/2013   No results found for: CHOL, HDL, LDLCALC, LDLDIRECT, TRIG, CHOLHDL  Significant Diagnostic Results in last 30 days:  No results found.  Assessment/Plan Osteoarthritis, multiple sites OA pain is controlled, on Tylenol 500mg  qhs and prn.   [place] left upper eyelid left upper eyelid appears red with lump. The patient denied pain when palpated or change of vision.   Depression, recurrent ([place]) Sleeps/eats at her baseline, takesMirtazapine 7.5mg  qd.  Tachycardia Heart rate is in control, on Metoprolol 25mg  bid  Vascular dementia ([place]) Her memory is preserved on Mamantine 10mg  qd. No behavioral issues, self propels w/c to get around. TSH 2.65 09/21/20  GERD (gastroesophageal reflux disease) stable, on Omeprazole 10mg  qd. Hgb 12.4 09/21/20  Chronic bronchitis (HCC) COPD/chronic bronchitis, takes Breo Ellipta  qd    Family/ staff Communication: plan of care reviewed with the patient and charge nurse.   Labs/tests ordered:  none  Time spend 35 minutes.

## 2021-01-05 NOTE — Assessment & Plan Note (Signed)
Sleeps/eats at her baseline, takesMirtazapine 7.5mg  qd.

## 2021-01-05 NOTE — Assessment & Plan Note (Signed)
COPD/chronic bronchitis, takes Breo Ellipta qd

## 2021-01-05 NOTE — Assessment & Plan Note (Signed)
left upper eyelid appears red with lump. The patient denied pain when palpated or change of vision.

## 2021-02-07 ENCOUNTER — Non-Acute Institutional Stay (SKILLED_NURSING_FACILITY): Payer: Medicare Other | Admitting: Nurse Practitioner

## 2021-02-07 ENCOUNTER — Encounter: Payer: Self-pay | Admitting: Nurse Practitioner

## 2021-02-07 DIAGNOSIS — R000 Tachycardia, unspecified: Secondary | ICD-10-CM

## 2021-02-07 DIAGNOSIS — I10 Essential (primary) hypertension: Secondary | ICD-10-CM

## 2021-02-07 DIAGNOSIS — F339 Major depressive disorder, recurrent, unspecified: Secondary | ICD-10-CM

## 2021-02-07 DIAGNOSIS — F0150 Vascular dementia without behavioral disturbance: Secondary | ICD-10-CM

## 2021-02-07 DIAGNOSIS — M159 Polyosteoarthritis, unspecified: Secondary | ICD-10-CM

## 2021-02-07 DIAGNOSIS — J410 Simple chronic bronchitis: Secondary | ICD-10-CM

## 2021-02-07 DIAGNOSIS — K219 Gastro-esophageal reflux disease without esophagitis: Secondary | ICD-10-CM

## 2021-02-07 NOTE — Assessment & Plan Note (Signed)
COPD/chronic bronchitis, takes Breo Ellipta qd

## 2021-02-07 NOTE — Assessment & Plan Note (Signed)
Elevated Sbp in 160s, isolated event, asymptomatic, continue to observe.

## 2021-02-07 NOTE — Assessment & Plan Note (Signed)
Her memory is preserved on Mamantine 10mg  qd. No behavioral issues, self propels w/c to get around. TSH 2.65 09/21/20

## 2021-02-07 NOTE — Assessment & Plan Note (Signed)
Sleeps/eats at her baseline, takesMirtazapine 7.5mg  qd.

## 2021-02-07 NOTE — Assessment & Plan Note (Signed)
OA pain is controlled, on Tylenol '500mg'$  qhs

## 2021-02-07 NOTE — Assessment & Plan Note (Signed)
Heart rate is in control, on Metoprolol 25mg  bid

## 2021-02-07 NOTE — Assessment & Plan Note (Signed)
stable, on Omeprazole 10mg  qd. Hgb 12.4 09/21/20

## 2021-02-07 NOTE — Progress Notes (Signed)
Location:   [facility] Room Number: [phone] Place of Service:  SNF (31) Provider:  Keilana Morlock Otho Darner, NP  [name], MD  Patient Care Team: [name], MD as PCP - General (Internal Medicine) [name], MD as Referring Physician (Ophthalmology) Kyndal Gloster X, NP as Nurse Practitioner (Internal Medicine)  Extended Emergency Contact Information Primary Emergency Contact: [name] Address: [address]          [place], [place] [address] [name] of [place] Phone: [phone] Mobile Phone: [phone] Relation: Son Secondary Emergency Contact: [name] Address: [address]          [place], [place] [address] [name] of [place] Phone: [phone] Mobile Phone: [phone] Relation: Son  Code Status:  DNR Goals of care: Advanced Directive information Advanced Directives 02/07/2021  Does Patient Have a Medical Advance Directive? Yes  Type of Paramedic of [place];Out of facility DNR (pink MOST or yellow form)  Does patient want to make changes to medical advance directive? No - Patient declined  Copy of [place] in Chart? Yes - validated most recent copy scanned in chart (See row information)  Would patient like information on creating a medical advance directive? -  Pre-existing out of facility DNR order (yellow form or pink MOST form) Yellow form placed in chart (order not valid for inpatient use);Pink MOST form placed in chart (order not valid for inpatient use)     Chief Complaint  Patient presents with   Medical Management of Chronic Issues    Routine follow up    Health Maintenance    Discuss need for shingles vaccine and updated influenza vaccine.     HPI:  Pt is a 87 y.o. female seen today for medical management of chronic diseases.      Sleeps/eats at her baseline, takesMirtazapine 7.'5mg'$  qd.             Heart rate is in control, on Metoprolol '25mg'$   bid             OA pain is controlled, on Tylenol '500mg'$  qhs              Her memory is preserved on Mamantine '10mg'$  qd. No behavioral issues, self propels w/c to get around. TSH 2.65 09/21/20             GERD, stable, on Omeprazole '10mg'$  qd. Hgb 12.4 09/21/20             COPD/chronic bronchitis, takes Breo Ellipta qd  Past Medical History:  Diagnosis Date   Anemia, iron deficiency    takes Ferrous Sulfate daily   Depression    takes Effexor daily   Gait disorder 04/21/2014   GERD (gastroesophageal reflux disease)    takes Omeprazole daily   Herpes ocular    history of-takes Acyclovir daily   High cholesterol    takes Atorvastatin daily   History of hiatal hernia    Hypertension    takes Metoprolol daily   Insomnia    takes Xanax nightly   Osteoarthritis of knee    bilateral   Sciatic pain    Past Surgical History:  Procedure Laterality Date   ABDOMINAL HYSTERECTOMY  1995   partial   CARPAL TUNNEL RELEASE Right 07/21/2007   CARPAL TUNNEL RELEASE Left 09/24/2007   COLONOSCOPY     DESCEMETS STRIPPING AUTOMATED ENDOTHELIAL KERATOPLASTY Left 03/14/2011   DESCEMETS STRIPPING AUTOMATED ENDOTHELIAL KERATOPLASTY Right 08/20/2012   EYE  SURGERY Bilateral    cataract surgery   EYE SURGERY Left    corneal transplant   HERNIA REPAIR     HIP ARTHROPLASTY Left 06/13/2013   Procedure: ARTHROPLASTY BIPOLAR HIP; Injection left shoulder;  Surgeon: Johnny Bridge, MD;  Location: [facility];  Service: Orthopedics;  Laterality: Left;   JOINT REPLACEMENT     bilateral knees, left knee   KNEE ARTHROSCOPY Right 05/11/2001   KYPHOPLASTY N/A 04/10/2015   Procedure: T11 Kyphoplasty;  Surgeon: Jovita Gamma, MD;  Location: [facility];  Service: Neurosurgery;  Laterality: N/A;  T11 Kyphoplasty   KYPHOPLASTY N/A 05/06/2015   Procedure: KYPHOPLASTY Thoracic twelve;  Surgeon: Jovita Gamma, MD;  Location: [facility];  Service: Neurosurgery;  Laterality: N/A;  T12 Kyphoplasty   LAPAROSCOPIC NISSEN  FUNDOPLICATION  [id number]   LUMBAR LAMINECTOMY/DECOMPRESSION MICRODISCECTOMY  08/29/2010   L2-S1   SHOULDER ARTHROSCOPY WITH ROTATOR CUFF REPAIR AND SUBACROMIAL DECOMPRESSION Right 01/01/2000   TONSILLECTOMY     as child   TOTAL HIP REVISION Left 07/08/2014   Procedure: TOTAL HIP REVISION;  Surgeon: Kerin Salen, MD;  Location: [facility];  Service: Orthopedics;  Laterality: Left;   TOTAL KNEE ARTHROPLASTY Left 05/14/2004   TOTAL KNEE ARTHROPLASTY  12/23/2011   Procedure: TOTAL KNEE ARTHROPLASTY;  Surgeon: Lorn Junes, MD;  Location: [facility];  Service: Orthopedics;  Laterality: Right;  DR Noemi Chapel WANTS 90 MINUTES FOR THIS CASE   TRIGGER FINGER RELEASE Right 07/21/2007   thumb   TRIGGER FINGER RELEASE Left 09/24/2007   middle finger   TRIGGER FINGER RELEASE Right 03/10/2008   ring and little fingers   TRIGGER FINGER RELEASE Right 09/02/2012   Procedure: RELEASE TRIGGER FINGER/A-1 PULLEY RIGHT INDEX FINGER;  Surgeon: Wynonia Sours, MD;  Location: [facility];  Service: Orthopedics;  Laterality: Right;   TRIGGER FINGER RELEASE Left 12/22/2012   Procedure: RELEASE TRIGGER FINGER/A-1 PULLEY LEFT RING FINGER;  Surgeon: Wynonia Sours, MD;  Location: [facility];  Service: Orthopedics;  Laterality: Left;  Left     Allergies  Allergen Reactions   Morphine And Related Other (See Comments)    AGITATION, STRANGE DREAMS   Scopolamine Other (See Comments)    MENTAL CHANGES   Atorvastatin Other (See Comments)    unknown   Morphine Other (See Comments)    Disoriented, mood changes   Nsaids Other (See Comments)    unknown   Pravachol [Pravastatin Sodium] Other (See Comments)    unknown   Pravastatin Other (See Comments)    unknown   Teriparatide Other (See Comments)    unknown    Allergies as of 02/07/2021       Reactions   Morphine And Related Other (See Comments)   AGITATION, STRANGE DREAMS   Scopolamine Other (See Comments)   MENTAL CHANGES   Atorvastatin Other (See  Comments)   unknown   Morphine Other (See Comments)   Disoriented, mood changes   Nsaids Other (See Comments)   unknown   Pravachol [pravastatin Sodium] Other (See Comments)   unknown   Pravastatin Other (See Comments)   unknown   Teriparatide Other (See Comments)   unknown        Medication List        Accurate as of February 07, 2021 11:59 PM. If you have any questions, ask your nurse or doctor.          acetaminophen 500 MG tablet Commonly known as: TYLENOL Take 500 mg by mouth  at bedtime.   Artificial Tears 0.2-0.2-1 % Soln Generic drug: Glycerin-Hypromellose-PEG 400 Place 1 drop into both eyes 4 (four) times daily.   aspirin EC 81 MG tablet Take 81 mg by mouth daily.   cholecalciferol 25 MCG (1000 UNIT) tablet Commonly known as: VITAMIN D3 Take 1,000 Units by mouth daily.   fluticasone furoate-vilanterol 100-25 MCG/INH Aepb Commonly known as: BREO ELLIPTA Inhale 1 puff into the lungs daily.   magnesium hydroxide 400 MG/5ML suspension Commonly known as: MILK OF MAGNESIA Take 15 mLs by mouth at bedtime as needed for mild constipation.   memantine 10 MG tablet Commonly known as: NAMENDA Take 10 mg by mouth 2 (two) times daily.   metoprolol tartrate 25 MG tablet Commonly known as: LOPRESSOR Take 25 mg by mouth 2 (two) times daily.   mirtazapine 15 MG tablet Commonly known as: REMERON Take 7.5 mg by mouth at bedtime.   nitroGLYCERIN 0.4 MG SL tablet Commonly known as: NITROSTAT Place 0.4 mg under the tongue every 5 (five) minutes as needed for chest pain.   omeprazole 10 MG capsule Commonly known as: PRILOSEC Take 10 mg by mouth daily.   Systane Nighttime Oint Apply to both eyes at bedtime        Review of Systems  Constitutional:  Negative for fatigue, fever and unexpected weight change.  HENT:  Positive for hearing loss. Negative for congestion and trouble swallowing.   Eyes:  Negative for visual disturbance.  Respiratory:  Negative for  cough.   Cardiovascular:  Negative for leg swelling.  Gastrointestinal:  Negative for abdominal pain and constipation.  Genitourinary:  Negative for dysuria and urgency.  Musculoskeletal:  Positive for arthralgias, back pain and gait problem.  Skin:  Negative for color change.  Neurological:  Negative for speech difficulty, weakness and light-headedness.       Dementia  Psychiatric/Behavioral:  Positive for confusion. Negative for behavioral problems and sleep disturbance. The patient is not nervous/anxious.    Immunization History  Administered Date(s) Administered   Influenza Whole 03/27/2018   Influenza, High Dose Seasonal PF 03/26/2019   Influenza-Unspecified 04/14/2017, 04/05/2020   Moderna SARS-COV2 Booster Vaccination 11/21/2020   Moderna Sars-Covid-2 Vaccination 07/24/2019, 08/21/2019, 05/02/2020   Pneumococcal Conjugate-13 09/24/2017   Pneumococcal Polysaccharide-23 06/14/2013   Td 09/25/2017   Varicella 06/29/2003   Pertinent  Health Maintenance Due  Topic Date Due   INFLUENZA VACCINE  01/22/2021   DEXA SCAN  Completed   PNA vac Low Risk Adult  Completed   Fall Risk  09/19/2017  Falls in the past year? No   Functional Status Survey:    Vitals:   02/07/21 1453  BP: (!) 160/83  Pulse: 84  Resp: 18  Temp: (!) 97.5 F (36.4 C)  SpO2: 95%  Weight: 112 lb 8 oz (51 kg)  Height: '5\' 1"'$  (1.549 m)   Body mass index is 21.26 kg/m. Physical Exam Vitals and nursing note reviewed.  Constitutional:      Appearance: Normal appearance.  HENT:     Head: Normocephalic and atraumatic.  Eyes:     Extraocular Movements: Extraocular movements intact.     Conjunctiva/sclera: Conjunctivae normal.     Pupils: Pupils are equal, round, and reactive to light.  Cardiovascular:     Rate and Rhythm: Normal rate and regular rhythm.     Heart sounds: No murmur heard. Pulmonary:     Effort: Pulmonary effort is normal.     Breath sounds: No rales.  Abdominal:  Palpations:  Abdomen is soft.     Tenderness: There is no abdominal tenderness.  Musculoskeletal:     Cervical back: Normal range of motion and neck supple.     Right lower leg: No edema.     Left lower leg: No edema.  Skin:    General: Skin is warm and dry.  Neurological:     General: No focal deficit present.     Mental Status: She is alert. Mental status is at baseline.     Gait: Gait abnormal.     Comments: Oriented to self.   Psychiatric:        Mood and Affect: Mood normal.        Behavior: Behavior normal.    Labs reviewed: Recent Labs    09/14/20 0000  NA 135*  K 4.5  CL 101  CO2 27*  BUN 17  CREATININE 0.7  CALCIUM 8.7   Recent Labs    09/14/20 0000 09/14/20 2227  AST  --  18  ALT  --  16  ALKPHOS  --  63  ALBUMIN 3.4*  --    Recent Labs    09/14/20 2227  WBC 5.4  HGB 11.6*  HCT 35*  PLT 233   Lab Results  Component Value Date   TSH 2.65 09/14/2020   Lab Results  Component Value Date   HGBA1C 5.8 06/29/2013   No results found for: CHOL, HDL, LDLCALC, LDLDIRECT, TRIG, CHOLHDL  Significant Diagnostic Results in last 30 days:  No results found.  Assessment/Plan Hypertension Elevated Sbp in 160s, isolated event, asymptomatic, continue to observe.   Tachycardia Heart rate is in control, on Metoprolol '25mg'$  bid  Depression, recurrent (HCC) Sleeps/eats at her baseline, takesMirtazapine 7.'5mg'$  qd.  Osteoarthritis, multiple sites OA pain is controlled, on Tylenol '500mg'$  qhs   Vascular dementia ([place]) Her memory is preserved on Mamantine '10mg'$  qd. No behavioral issues, self propels w/c to get around. TSH 2.65 09/21/20  GERD (gastroesophageal reflux disease) stable, on Omeprazole '10mg'$  qd. Hgb 12.4 09/21/20  Chronic bronchitis (HCC) COPD/chronic bronchitis, takes Breo Ellipta qd    Family/ staff Communication: plan of care reviewed with the patient and charge nurse.   Labs/tests ordered:  none  Time spend 35 minutes.

## 2021-02-08 ENCOUNTER — Encounter: Payer: Self-pay | Admitting: Nurse Practitioner

## 2021-03-12 ENCOUNTER — Non-Acute Institutional Stay (SKILLED_NURSING_FACILITY): Payer: Medicare Other | Admitting: Internal Medicine

## 2021-03-12 ENCOUNTER — Encounter: Payer: Self-pay | Admitting: Internal Medicine

## 2021-03-12 DIAGNOSIS — K219 Gastro-esophageal reflux disease without esophagitis: Secondary | ICD-10-CM

## 2021-03-12 DIAGNOSIS — D509 Iron deficiency anemia, unspecified: Secondary | ICD-10-CM

## 2021-03-12 DIAGNOSIS — F0150 Vascular dementia without behavioral disturbance: Secondary | ICD-10-CM

## 2021-03-12 DIAGNOSIS — F339 Major depressive disorder, recurrent, unspecified: Secondary | ICD-10-CM

## 2021-03-12 DIAGNOSIS — I10 Essential (primary) hypertension: Secondary | ICD-10-CM | POA: Diagnosis not present

## 2021-03-12 DIAGNOSIS — J410 Simple chronic bronchitis: Secondary | ICD-10-CM

## 2021-03-12 NOTE — Progress Notes (Signed)
Location:   [facility] Room Number: 42 Place of Service:  SNF [phone]) Provider:  Veleta Miners MD   [name], MD  Patient Care Team: [name], MD as PCP - General (Internal Medicine) [name], MD as Referring Physician (Ophthalmology) Mast, Man X, NP as Nurse Practitioner (Internal Medicine)  Extended Emergency Contact Information Primary Emergency Contact: [name] Address: [address]          [place], [place] [address] [name] of [place] Phone: [phone] Mobile Phone: [phone] Relation: Son Secondary Emergency Contact: [name] Address: [address]          [place], [place] [address] [name] of [place] Phone: [phone] Mobile Phone: [phone] Relation: Son  Code Status:  DNR Goals of care: Advanced Directive information Advanced Directives 03/12/2021  Does Patient Have a Medical Advance Directive? Yes  Type of Paramedic of [place];Out of facility DNR (pink MOST or yellow form)  Does patient want to make changes to medical advance directive? No - Patient declined  Copy of [place] in Chart? Yes - validated most recent copy scanned in chart (See row information)  Would patient like information on creating a medical advance directive? -  Pre-existing out of facility DNR order (yellow form or pink MOST form) Yellow form placed in chart (order not valid for inpatient use);Pink MOST form placed in chart (order not valid for inpatient use)     Chief Complaint  Patient presents with   Medical Management of Chronic Issues   Quality Metric Gaps    Shingrix, flu shot    HPI:  Pt is a 87 y.o. female seen today for medical management of chronic diseases.    Patient has a history of Hypertension, anemia, depression, Hyperlipidemia , COPD, hyponatremia,Depression with Anxiety and Dementia And Covid Pneumonia  Continues to stay stable No  New isues  Weight at baseline Uses Wheelchair  No Falls    Past Medical History:  Diagnosis Date   Anemia, iron deficiency    takes Ferrous Sulfate daily   Depression    takes Effexor daily   Gait disorder 04/21/2014   GERD (gastroesophageal reflux disease)    takes Omeprazole daily   Herpes ocular    history of-takes Acyclovir daily   High cholesterol    takes Atorvastatin daily   History of hiatal hernia    Hypertension    takes Metoprolol daily   Insomnia    takes Xanax nightly   Osteoarthritis of knee    bilateral   Sciatic pain    Past Surgical History:  Procedure Laterality Date   ABDOMINAL HYSTERECTOMY  1995   partial   CARPAL TUNNEL RELEASE Right 07/21/2007   CARPAL TUNNEL RELEASE Left 09/24/2007   COLONOSCOPY     DESCEMETS STRIPPING AUTOMATED ENDOTHELIAL KERATOPLASTY Left 03/14/2011   DESCEMETS STRIPPING AUTOMATED ENDOTHELIAL KERATOPLASTY Right 08/20/2012   EYE SURGERY Bilateral    cataract surgery   EYE SURGERY Left    corneal transplant   HERNIA REPAIR     HIP ARTHROPLASTY Left 06/13/2013   Procedure: ARTHROPLASTY BIPOLAR HIP; Injection left shoulder;  Surgeon: Johnny Bridge, MD;  Location: [facility];  Service: Orthopedics;  Laterality: Left;   JOINT REPLACEMENT     bilateral knees, left knee   KNEE ARTHROSCOPY Right 05/11/2001   KYPHOPLASTY N/A 04/10/2015   Procedure: T11 Kyphoplasty;  Surgeon: Jovita Gamma, MD;  Location: [facility];  Service: Neurosurgery;  Laterality: N/A;  T11 Kyphoplasty   KYPHOPLASTY N/A 05/06/2015   Procedure: KYPHOPLASTY Thoracic twelve;  Surgeon: Jovita Gamma, MD;  Location: [facility];  Service: Neurosurgery;  Laterality: N/A;  T12 Kyphoplasty   LAPAROSCOPIC NISSEN FUNDOPLICATION  [id number]   LUMBAR LAMINECTOMY/DECOMPRESSION MICRODISCECTOMY  08/29/2010   L2-S1   SHOULDER ARTHROSCOPY WITH ROTATOR CUFF REPAIR AND SUBACROMIAL DECOMPRESSION Right 01/01/2000   TONSILLECTOMY     as child   TOTAL HIP REVISION Left 07/08/2014    Procedure: TOTAL HIP REVISION;  Surgeon: Kerin Salen, MD;  Location: [facility];  Service: Orthopedics;  Laterality: Left;   TOTAL KNEE ARTHROPLASTY Left 05/14/2004   TOTAL KNEE ARTHROPLASTY  12/23/2011   Procedure: TOTAL KNEE ARTHROPLASTY;  Surgeon: Lorn Junes, MD;  Location: [facility];  Service: Orthopedics;  Laterality: Right;  DR Noemi Chapel WANTS 90 MINUTES FOR THIS CASE   TRIGGER FINGER RELEASE Right 07/21/2007   thumb   TRIGGER FINGER RELEASE Left 09/24/2007   middle finger   TRIGGER FINGER RELEASE Right 03/10/2008   ring and little fingers   TRIGGER FINGER RELEASE Right 09/02/2012   Procedure: RELEASE TRIGGER FINGER/A-1 PULLEY RIGHT INDEX FINGER;  Surgeon: Wynonia Sours, MD;  Location: [facility];  Service: Orthopedics;  Laterality: Right;   TRIGGER FINGER RELEASE Left 12/22/2012   Procedure: RELEASE TRIGGER FINGER/A-1 PULLEY LEFT RING FINGER;  Surgeon: Wynonia Sours, MD;  Location: [facility];  Service: Orthopedics;  Laterality: Left;  Left     Allergies  Allergen Reactions   Morphine And Related Other (See Comments)    AGITATION, STRANGE DREAMS   Scopolamine Other (See Comments)    MENTAL CHANGES   Atorvastatin Other (See Comments)    unknown   Morphine Other (See Comments)    Disoriented, mood changes   Nsaids Other (See Comments)    unknown   Pravachol [Pravastatin Sodium] Other (See Comments)    unknown   Pravastatin Other (See Comments)    unknown   Teriparatide Other (See Comments)    unknown    Allergies as of 03/12/2021       Reactions   Morphine And Related Other (See Comments)   AGITATION, STRANGE DREAMS   Scopolamine Other (See Comments)   MENTAL CHANGES   Atorvastatin Other (See Comments)   unknown   Morphine Other (See Comments)   Disoriented, mood changes   Nsaids Other (See Comments)   unknown   Pravachol [pravastatin Sodium] Other (See Comments)   unknown   Pravastatin Other (See Comments)   unknown   Teriparatide Other (See  Comments)   unknown        Medication List        Accurate as of March 12, 2021  2:11 PM. If you have any questions, ask your nurse or doctor.          acetaminophen 500 MG tablet Commonly known as: TYLENOL Take 500 mg by mouth at bedtime.   Artificial Tears 0.2-0.2-1 % Soln Generic drug: Glycerin-Hypromellose-PEG 400 Place 1 drop into both eyes 4 (four) times daily.   aspirin EC 81 MG tablet Take 81 mg by mouth daily.   cholecalciferol 25 MCG (1000 UNIT) tablet Commonly known as: VITAMIN D3 Take 1,000 Units by mouth daily.   fluticasone furoate-vilanterol 100-25 MCG/INH Aepb Commonly known as: BREO ELLIPTA Inhale 1 puff into the lungs daily.   lactose free nutrition Liqd Take 237 mLs by mouth daily.   magnesium hydroxide 400 MG/5ML suspension Commonly known  as: MILK OF MAGNESIA Take 15 mLs by mouth at bedtime as needed for mild constipation.   memantine 10 MG tablet Commonly known as: NAMENDA Take 10 mg by mouth 2 (two) times daily.   metoprolol tartrate 25 MG tablet Commonly known as: LOPRESSOR Take 25 mg by mouth 2 (two) times daily.   mirtazapine 15 MG tablet Commonly known as: REMERON Take 7.5 mg by mouth at bedtime.   nitroGLYCERIN 0.4 MG SL tablet Commonly known as: NITROSTAT Place 0.4 mg under the tongue every 5 (five) minutes as needed for chest pain.   omeprazole 10 MG capsule Commonly known as: PRILOSEC Take 10 mg by mouth daily.   Systane Nighttime Oint Apply to both eyes at bedtime        Review of Systems  Unable to perform ROS: Dementia   Immunization History  Administered Date(s) Administered   Influenza Whole 03/27/2018   Influenza, High Dose Seasonal PF 03/26/2019   Influenza-Unspecified 04/14/2017, 04/05/2020   Moderna SARS-COV2 Booster Vaccination 11/21/2020   Moderna Sars-Covid-2 Vaccination 07/24/2019, 08/21/2019, 05/02/2020   Pneumococcal Conjugate-13 09/24/2017   Pneumococcal Polysaccharide-23 06/14/2013   Td  09/25/2017   Varicella 06/29/2003   Pertinent  Health Maintenance Due  Topic Date Due   INFLUENZA VACCINE  01/22/2021   DEXA SCAN  Completed   Fall Risk  09/19/2017  Falls in the past year? No   Functional Status Survey:    Vitals:   03/12/21 1354  BP: 130/60  Pulse: 73  Resp: 18  Temp: (!) 97.4 F (36.3 C)  SpO2: 94%  Weight: 112 lb 14.4 oz (51.2 kg)  Height: '5\' 1"'$  (1.549 m)   Body mass index is 21.33 kg/m. Physical Exam Vitals reviewed.  Constitutional:      Appearance: Normal appearance.  HENT:     Head: Normocephalic.     Nose: Nose normal.     Mouth/Throat:     Mouth: Mucous membranes are moist.     Pharynx: Oropharynx is clear.  Eyes:     Pupils: Pupils are equal, round, and reactive to light.  Cardiovascular:     Rate and Rhythm: Normal rate and regular rhythm.     Pulses: Normal pulses.  Pulmonary:     Effort: Pulmonary effort is normal.     Breath sounds: Normal breath sounds.  Abdominal:     General: Abdomen is flat. Bowel sounds are normal.     Palpations: Abdomen is soft.  Musculoskeletal:        General: No swelling.     Cervical back: Neck supple.  Skin:    General: Skin is warm.  Neurological:     General: No focal deficit present.     Mental Status: She is alert.     Comments: Alert Follows Commands Not oriented. Stays in her Wheelchair  Psychiatric:        Mood and Affect: Mood normal.        Thought Content: Thought content normal.    Labs reviewed: Recent Labs    09/14/20 0000  NA 135*  K 4.5  CL 101  CO2 27*  BUN 17  CREATININE 0.7  CALCIUM 8.7   Recent Labs    09/14/20 0000 09/14/20 2227  AST  --  18  ALT  --  16  ALKPHOS  --  63  ALBUMIN 3.4*  --    Recent Labs    09/14/20 2227  WBC 5.4  HGB 11.6*  HCT 35*  PLT 233  Lab Results  Component Value Date   TSH 2.65 09/14/2020   Lab Results  Component Value Date   HGBA1C 5.8 06/29/2013   No results found for: CHOL, HDL, LDLCALC, LDLDIRECT, TRIG,  CHOLHDL  Significant Diagnostic Results in last 30 days:  No results found.  Assessment/Plan Primary hypertension BP table on Lopressor Vascular dementia without behavioral disturbance (HCC) On Namenda and Aspirin Depression, recurrent (HCC) Continue Low dose Remeron No GDR Iron deficiency anemia,  HGB stable off iron Gastroesophageal reflux disease, On Prilosec Simple chronic bronchitis (HCC) On Breo   Family/ staff Communication:   Labs/tests ordered:

## 2021-04-05 ENCOUNTER — Encounter: Payer: Self-pay | Admitting: Nurse Practitioner

## 2021-04-05 ENCOUNTER — Non-Acute Institutional Stay (SKILLED_NURSING_FACILITY): Payer: Medicare Other | Admitting: Nurse Practitioner

## 2021-04-05 DIAGNOSIS — J410 Simple chronic bronchitis: Secondary | ICD-10-CM | POA: Diagnosis not present

## 2021-04-05 DIAGNOSIS — M159 Polyosteoarthritis, unspecified: Secondary | ICD-10-CM | POA: Diagnosis not present

## 2021-04-05 DIAGNOSIS — F339 Major depressive disorder, recurrent, unspecified: Secondary | ICD-10-CM

## 2021-04-05 DIAGNOSIS — F01C18 Vascular dementia, severe, with other behavioral disturbance: Secondary | ICD-10-CM | POA: Diagnosis not present

## 2021-04-05 DIAGNOSIS — K219 Gastro-esophageal reflux disease without esophagitis: Secondary | ICD-10-CM

## 2021-04-05 DIAGNOSIS — I10 Essential (primary) hypertension: Secondary | ICD-10-CM

## 2021-04-05 DIAGNOSIS — R000 Tachycardia, unspecified: Secondary | ICD-10-CM

## 2021-04-05 NOTE — Assessment & Plan Note (Signed)
Heart rate is in control, on Metoprolol 25mg  bid

## 2021-04-05 NOTE — Progress Notes (Signed)
Location:   SNF [facility] Room Number: 42B Place of Service:  SNF (31) Provider: [facility] Daeton Kluth NP  [name], MD  Patient Care Team: [name], MD as PCP - General (Internal Medicine) [name], MD as Referring Physician (Ophthalmology) Aamani Moose X, NP as Nurse Practitioner (Internal Medicine)  Extended Emergency Contact Information Primary Emergency Contact: [name] Address: [address]          [place], [place] [address] [name] of [place] Phone: [phone] Mobile Phone: [phone] Relation: Son Secondary Emergency Contact: [name] Address: [address]          [place], [place] [address] [name] of [place] Phone: [phone] Mobile Phone: [phone] Relation: Son  Code Status:  DNR Goals of care: Advanced Directive information Advanced Directives 03/12/2021  Does Patient Have a Medical Advance Directive? Yes  Type of Paramedic of [place];Out of facility DNR (pink MOST or yellow form)  Does patient want to make changes to medical advance directive? No - Patient declined  Copy of [place] in Chart? Yes - validated most recent copy scanned in chart (See row information)  Would patient like information on creating a medical advance directive? -  Pre-existing out of facility DNR order (yellow form or pink MOST form) Yellow form placed in chart (order not valid for inpatient use);Pink MOST form placed in chart (order not valid for inpatient use)     Chief Complaint  Patient presents with  . Medical Management of Chronic Issues    HPI:  Pt is a 87 y.o. female seen today for medical management of chronic diseases.       Sleeps/eats at her baseline, takesMirtazapine 7.5mg  qd.  HTN, takes Metoprolol.              Heart rate is in control, on Metoprolol 25mg  bid             OA pain is controlled, on Tylenol 500mg  qhs              Her memory is preserved on  Mamantine 10mg  qd. No behavioral issues, self propels w/c to get around. TSH 2.65 09/21/20             GERD, stable, on Omeprazole 10mg  qd. Hgb 12.4 09/21/20  Chronic bronchitis, takes Breo             COPD/chronic bronchitis, takes Breo Ellipta qd  Past Medical History:  Diagnosis Date  . Anemia, iron deficiency    takes Ferrous Sulfate daily  . Depression    takes Effexor daily  . Gait disorder 04/21/2014  . GERD (gastroesophageal reflux disease)    takes Omeprazole daily  . Herpes ocular    history of-takes Acyclovir daily  . High cholesterol    takes Atorvastatin daily  . History of hiatal hernia   . Hypertension    takes Metoprolol daily  . Insomnia    takes Xanax nightly  . Osteoarthritis of knee    bilateral  . Sciatic pain    Past Surgical History:  Procedure Laterality Date  . ABDOMINAL HYSTERECTOMY  1995   partial  . CARPAL TUNNEL RELEASE Right 07/21/2007  . CARPAL TUNNEL RELEASE Left 09/24/2007  . COLONOSCOPY    . DESCEMETS STRIPPING AUTOMATED ENDOTHELIAL KERATOPLASTY Left 03/14/2011  . DESCEMETS STRIPPING AUTOMATED ENDOTHELIAL KERATOPLASTY Right 08/20/2012  . EYE SURGERY Bilateral    cataract surgery  . EYE SURGERY Left    corneal  transplant  . HERNIA REPAIR    . HIP ARTHROPLASTY Left 06/13/2013   Procedure: ARTHROPLASTY BIPOLAR HIP; Injection left shoulder;  Surgeon: Johnny Bridge, MD;  Location: [facility];  Service: Orthopedics;  Laterality: Left;  . JOINT REPLACEMENT     bilateral knees, left knee  . KNEE ARTHROSCOPY Right 05/11/2001  . KYPHOPLASTY N/A 04/10/2015   Procedure: T11 Kyphoplasty;  Surgeon: Jovita Gamma, MD;  Location: [facility];  Service: Neurosurgery;  Laterality: N/A;  T11 Kyphoplasty  . KYPHOPLASTY N/A 05/06/2015   Procedure: KYPHOPLASTY Thoracic twelve;  Surgeon: Jovita Gamma, MD;  Location: [facility];  Service: Neurosurgery;  Laterality: N/A;  T12 Kyphoplasty  . LAPAROSCOPIC NISSEN FUNDOPLICATION  1/61/0960  . LUMBAR  LAMINECTOMY/DECOMPRESSION MICRODISCECTOMY  08/29/2010   L2-S1  . SHOULDER ARTHROSCOPY WITH ROTATOR CUFF REPAIR AND SUBACROMIAL DECOMPRESSION Right 01/01/2000  . TONSILLECTOMY     as child  . TOTAL HIP REVISION Left 07/08/2014   Procedure: TOTAL HIP REVISION;  Surgeon: Kerin Salen, MD;  Location: [facility];  Service: Orthopedics;  Laterality: Left;  . TOTAL KNEE ARTHROPLASTY Left 05/14/2004  . TOTAL KNEE ARTHROPLASTY  12/23/2011   Procedure: TOTAL KNEE ARTHROPLASTY;  Surgeon: Lorn Junes, MD;  Location: [facility];  Service: Orthopedics;  Laterality: Right;  DR [place] THIS CASE  . TRIGGER FINGER RELEASE Right 07/21/2007   thumb  . TRIGGER FINGER RELEASE Left 09/24/2007   middle finger  . TRIGGER FINGER RELEASE Right 03/10/2008   ring and little fingers  . TRIGGER FINGER RELEASE Right 09/02/2012   Procedure: RELEASE TRIGGER FINGER/A-1 PULLEY RIGHT INDEX FINGER;  Surgeon: Wynonia Sours, MD;  Location: [facility];  Service: Orthopedics;  Laterality: Right;  . TRIGGER FINGER RELEASE Left 12/22/2012   Procedure: RELEASE TRIGGER FINGER/A-1 PULLEY LEFT RING FINGER;  Surgeon: Wynonia Sours, MD;  Location: [facility];  Service: Orthopedics;  Laterality: Left;  Left     Allergies  Allergen Reactions  . Morphine And Related Other (See Comments)    AGITATION, STRANGE DREAMS  . Scopolamine Other (See Comments)    MENTAL CHANGES  . Atorvastatin Other (See Comments)    unknown  . Morphine Other (See Comments)    Disoriented, mood changes  . Nsaids Other (See Comments)    unknown  . Pravachol [Pravastatin Sodium] Other (See Comments)    unknown  . Pravastatin Other (See Comments)    unknown  . Teriparatide Other (See Comments)    unknown    Allergies as of 04/05/2021       Reactions   Morphine And Related Other (See Comments)   AGITATION, STRANGE DREAMS   Scopolamine Other (See Comments)   MENTAL CHANGES   Atorvastatin Other (See Comments)   unknown    Morphine Other (See Comments)   Disoriented, mood changes   Nsaids Other (See Comments)   unknown   Pravachol [pravastatin Sodium] Other (See Comments)   unknown   Pravastatin Other (See Comments)   unknown   Teriparatide Other (See Comments)   unknown        Medication List        Accurate as of April 05, 2021 11:59 PM. If you have any questions, ask your nurse or doctor.          acetaminophen 500 MG tablet Commonly known as: TYLENOL Take 500 mg by mouth at bedtime.   Artificial Tears 0.2-0.2-1 % Soln Generic drug: Glycerin-Hypromellose-PEG 400 Place 1 drop  into both eyes 4 (four) times daily.   aspirin EC 81 MG tablet Take 81 mg by mouth daily.   cholecalciferol 25 MCG (1000 UNIT) tablet Commonly known as: VITAMIN D3 Take 1,000 Units by mouth daily.   fluticasone furoate-vilanterol 100-25 MCG/INH Aepb Commonly known as: BREO ELLIPTA Inhale 1 puff into the lungs daily.   lactose free nutrition Liqd Take 237 mLs by mouth daily.   magnesium hydroxide 400 MG/5ML suspension Commonly known as: MILK OF MAGNESIA Take 15 mLs by mouth at bedtime as needed for mild constipation.   memantine 10 MG tablet Commonly known as: NAMENDA Take 10 mg by mouth 2 (two) times daily.   metoprolol tartrate 25 MG tablet Commonly known as: LOPRESSOR Take 25 mg by mouth 2 (two) times daily.   mirtazapine 15 MG tablet Commonly known as: REMERON Take 7.5 mg by mouth at bedtime.   nitroGLYCERIN 0.4 MG SL tablet Commonly known as: NITROSTAT Place 0.4 mg under the tongue every 5 (five) minutes as needed for chest pain.   omeprazole 10 MG capsule Commonly known as: PRILOSEC Take 10 mg by mouth daily.   Systane Nighttime Oint Apply to both eyes at bedtime        Review of Systems  Constitutional:  Negative for fatigue, fever and unexpected weight change.  HENT:  Positive for hearing loss. Negative for congestion and trouble swallowing.   Eyes:  Negative for visual  disturbance.  Respiratory:  Negative for cough.   Cardiovascular:  Negative for leg swelling.  Gastrointestinal:  Negative for abdominal pain and constipation.  Genitourinary:  Negative for dysuria and urgency.  Musculoskeletal:  Positive for arthralgias, back pain and gait problem.  Skin:  Negative for color change.  Neurological:  Negative for speech difficulty, weakness and light-headedness.       Dementia  Psychiatric/Behavioral:  Positive for confusion. Negative for behavioral problems and sleep disturbance. The patient is not nervous/anxious.    Immunization History  Administered Date(s) Administered  . Influenza Whole 03/27/2018  . Influenza, High Dose Seasonal PF 03/26/2019  . Influenza-Unspecified 04/14/2017, 04/05/2020  . Moderna SARS-COV2 Booster Vaccination 11/21/2020  . Moderna Sars-Covid-2 Vaccination 07/24/2019, 08/21/2019, 05/02/2020  . Pneumococcal Conjugate-13 09/24/2017  . Pneumococcal Polysaccharide-23 06/14/2013  . Td 09/25/2017  . Varicella 06/29/2003   Pertinent  Health Maintenance Due  Topic Date Due  . INFLUENZA VACCINE  01/22/2021  . DEXA SCAN  Completed   Fall Risk  09/19/2017  Falls in the past year? No   Functional Status Survey:    Vitals:   04/05/21 1229  BP: 127/69  Pulse: 73  Resp: 18  Temp: (!) 96.7 F (35.9 C)  SpO2: 95%   There is no height or weight on file to calculate BMI. Physical Exam Vitals and nursing note reviewed.  Constitutional:      Appearance: Normal appearance.  HENT:     Head: Normocephalic and atraumatic.  Eyes:     Extraocular Movements: Extraocular movements intact.     Conjunctiva/sclera: Conjunctivae normal.     Pupils: Pupils are equal, round, and reactive to light.  Cardiovascular:     Rate and Rhythm: Normal rate and regular rhythm.     Heart sounds: No murmur heard. Pulmonary:     Effort: Pulmonary effort is normal.     Breath sounds: No rales.  Abdominal:     Palpations: Abdomen is soft.      Tenderness: There is no abdominal tenderness.  Musculoskeletal:     Cervical back:  Normal range of motion and neck supple.     Right lower leg: No edema.     Left lower leg: No edema.  Skin:    General: Skin is warm and dry.  Neurological:     General: No focal deficit present.     Mental Status: She is alert. Mental status is at baseline.     Gait: Gait abnormal.     Comments: Oriented to self.   Psychiatric:        Mood and Affect: Mood normal.        Behavior: Behavior normal.    Labs reviewed: Recent Labs    09/14/20 0000  NA 135*  K 4.5  CL 101  CO2 27*  BUN 17  CREATININE 0.7  CALCIUM 8.7   Recent Labs    09/14/20 0000 09/14/20 2227  AST  --  18  ALT  --  16  ALKPHOS  --  63  ALBUMIN 3.4*  --    Recent Labs    09/14/20 2227  WBC 5.4  HGB 11.6*  HCT 35*  PLT 233   Lab Results  Component Value Date   TSH 2.65 09/14/2020   Lab Results  Component Value Date   HGBA1C 5.8 06/29/2013   No results found for: CHOL, HDL, LDLCALC, LDLDIRECT, TRIG, CHOLHDL  Significant Diagnostic Results in last 30 days:  No results found.  Assessment/Plan  GERD (gastroesophageal reflux disease) stable, on Omeprazole 10mg  qd. Hgb 12.4 09/21/20  Chronic bronchitis (HCC) takes Breo  Vascular dementia [facility]) Her memory is preserved on Mamantine 10mg  qd. No behavioral issues, self propels w/c to get around. TSH 2.65 09/21/20  Osteoarthritis, multiple sites is controlled, on Tylenol 500mg  qhs   Tachycardia Heart rate is in control, on Metoprolol 25mg  bid  Depression, recurrent (HCC) Sleeps/eats at her baseline, takesMirtazapine 7.5mg  qd.  Hypertension Blood pressure is controlled, continue Metoprolol.    Family/ staff Communication: plan of care reviewed with the patient and charge nurse.   Labs/tests ordered:  none  Time spend 35 minutes.

## 2021-04-05 NOTE — Assessment & Plan Note (Signed)
Sleeps/eats at her baseline, takesMirtazapine 7.5mg  qd.

## 2021-04-05 NOTE — Assessment & Plan Note (Signed)
Her memory is preserved on Mamantine 10mg  qd. No behavioral issues, self propels w/c to get around. TSH 2.65 09/21/20

## 2021-04-05 NOTE — Assessment & Plan Note (Signed)
stable, on Omeprazole 10mg  qd. Hgb 12.4 09/21/20

## 2021-04-05 NOTE — Assessment & Plan Note (Signed)
Blood pressure is controlled, continue Metoprolol. 

## 2021-04-05 NOTE — Assessment & Plan Note (Signed)
is controlled, on Tylenol 500mg  qhs

## 2021-04-05 NOTE — Assessment & Plan Note (Signed)
takes [employer]

## 2021-04-06 ENCOUNTER — Encounter: Payer: Self-pay | Admitting: Nurse Practitioner

## 2021-04-09 ENCOUNTER — Encounter: Payer: Self-pay | Admitting: Nurse Practitioner

## 2021-05-07 ENCOUNTER — Non-Acute Institutional Stay (SKILLED_NURSING_FACILITY): Payer: Medicare Other | Admitting: Nurse Practitioner

## 2021-05-07 ENCOUNTER — Encounter: Payer: Self-pay | Admitting: Nurse Practitioner

## 2021-05-07 DIAGNOSIS — R000 Tachycardia, unspecified: Secondary | ICD-10-CM

## 2021-05-07 DIAGNOSIS — I10 Essential (primary) hypertension: Secondary | ICD-10-CM

## 2021-05-07 DIAGNOSIS — F339 Major depressive disorder, recurrent, unspecified: Secondary | ICD-10-CM | POA: Diagnosis not present

## 2021-05-07 DIAGNOSIS — M159 Polyosteoarthritis, unspecified: Secondary | ICD-10-CM | POA: Diagnosis not present

## 2021-05-07 DIAGNOSIS — K219 Gastro-esophageal reflux disease without esophagitis: Secondary | ICD-10-CM

## 2021-05-07 DIAGNOSIS — F01C18 Vascular dementia, severe, with other behavioral disturbance: Secondary | ICD-10-CM

## 2021-05-07 DIAGNOSIS — J410 Simple chronic bronchitis: Secondary | ICD-10-CM

## 2021-05-07 NOTE — Progress Notes (Signed)
Location:   [facility] Room Number: [place] of Service:  SNF (31) Provider:  Tnia Anglada X, NP  [name], MD  Patient Care Team: [name], MD as PCP - General (Internal Medicine) [name], MD as Referring Physician (Ophthalmology) Orien Mayhall X, NP as Nurse Practitioner (Internal Medicine)  Extended Emergency Contact Information Primary Emergency Contact: [name] Address: [address]          [place], [place] [address] [name] of [place] Phone: [phone] Mobile Phone: [phone] Relation: Son Secondary Emergency Contact: [name] Address: [address]          [place], [place] [address] [name] of [place] Phone: [phone] Mobile Phone: [phone] Relation: Son  Code Status:  DNR Goals of care: Advanced Directive information Advanced Directives 05/07/2021  Does Patient Have a Medical Advance Directive? Yes  Type of Paramedic of [place];Out of facility DNR (pink MOST or yellow form)  Does patient want to make changes to medical advance directive? No - Patient declined  Copy of [place] in Chart? Yes - validated most recent copy scanned in chart (See row information)  Would patient like information on creating a medical advance directive? -  Pre-existing out of facility DNR order (yellow form or pink MOST form) Yellow form placed in chart (order not valid for inpatient use);Pink MOST form placed in chart (order not valid for inpatient use)     Chief Complaint  Patient presents with   Medical Management of Chronic Issues    Routine Visit   Quality Metric Gaps    Shingrix    HPI:  Pt is a 87 y.o. female seen today for medical management of chronic diseases.       Sleeps/eats at her baseline, weight loss about #2-3Ibs in the past a mouth or two, takes Mirtazapine 7.5mg  qd.             HTN, takes Metoprolol.              Heart rate is  in control, on Metoprolol.              OA pain is controlled, on Tylenol.               Her memory is preserved on Mamantine. No behavioral issues, self propels w/c to get around. TSH 2.65 09/21/20             GERD, stable, on Omeprazole, Hgb 12.4 09/21/20             Chronic bronchitis, takes Breo             COPD/chronic bronchitis, takes Breo Ellipta qd   Past Medical History:  Diagnosis Date   Anemia, iron deficiency    takes Ferrous Sulfate daily   Depression    takes Effexor daily   Gait disorder 04/21/2014   GERD (gastroesophageal reflux disease)    takes Omeprazole daily   Herpes ocular    history of-takes Acyclovir daily   High cholesterol    takes Atorvastatin daily   History of hiatal hernia    Hypertension    takes Metoprolol daily   Insomnia    takes Xanax nightly   Osteoarthritis of knee    bilateral   Sciatic pain    Past Surgical History:  Procedure Laterality Date   ABDOMINAL HYSTERECTOMY  1995   partial   CARPAL TUNNEL RELEASE Right 07/21/2007  CARPAL TUNNEL RELEASE Left 09/24/2007   COLONOSCOPY     DESCEMETS STRIPPING AUTOMATED ENDOTHELIAL KERATOPLASTY Left 03/14/2011   DESCEMETS STRIPPING AUTOMATED ENDOTHELIAL KERATOPLASTY Right 08/20/2012   EYE SURGERY Bilateral    cataract surgery   EYE SURGERY Left    corneal transplant   HERNIA REPAIR     HIP ARTHROPLASTY Left 06/13/2013   Procedure: ARTHROPLASTY BIPOLAR HIP; Injection left shoulder;  Surgeon: Johnny Bridge, MD;  Location: [facility];  Service: Orthopedics;  Laterality: Left;   JOINT REPLACEMENT     bilateral knees, left knee   KNEE ARTHROSCOPY Right 05/11/2001   KYPHOPLASTY N/A 04/10/2015   Procedure: T11 Kyphoplasty;  Surgeon: Jovita Gamma, MD;  Location: [facility];  Service: Neurosurgery;  Laterality: N/A;  T11 Kyphoplasty   KYPHOPLASTY N/A 05/06/2015   Procedure: KYPHOPLASTY Thoracic twelve;  Surgeon: Jovita Gamma, MD;  Location: [facility];  Service: Neurosurgery;  Laterality: N/A;   T12 Kyphoplasty   LAPAROSCOPIC NISSEN FUNDOPLICATION  9/47/0962   LUMBAR LAMINECTOMY/DECOMPRESSION MICRODISCECTOMY  08/29/2010   L2-S1   SHOULDER ARTHROSCOPY WITH ROTATOR CUFF REPAIR AND SUBACROMIAL DECOMPRESSION Right 01/01/2000   TONSILLECTOMY     as child   TOTAL HIP REVISION Left 07/08/2014   Procedure: TOTAL HIP REVISION;  Surgeon: Kerin Salen, MD;  Location: [facility];  Service: Orthopedics;  Laterality: Left;   TOTAL KNEE ARTHROPLASTY Left 05/14/2004   TOTAL KNEE ARTHROPLASTY  12/23/2011   Procedure: TOTAL KNEE ARTHROPLASTY;  Surgeon: Lorn Junes, MD;  Location: [facility];  Service: Orthopedics;  Laterality: Right;  DR Noemi Chapel WANTS 90 MINUTES FOR THIS CASE   TRIGGER FINGER RELEASE Right 07/21/2007   thumb   TRIGGER FINGER RELEASE Left 09/24/2007   middle finger   TRIGGER FINGER RELEASE Right 03/10/2008   ring and little fingers   TRIGGER FINGER RELEASE Right 09/02/2012   Procedure: RELEASE TRIGGER FINGER/A-1 PULLEY RIGHT INDEX FINGER;  Surgeon: Wynonia Sours, MD;  Location: [facility];  Service: Orthopedics;  Laterality: Right;   TRIGGER FINGER RELEASE Left 12/22/2012   Procedure: RELEASE TRIGGER FINGER/A-1 PULLEY LEFT RING FINGER;  Surgeon: Wynonia Sours, MD;  Location: [facility];  Service: Orthopedics;  Laterality: Left;  Left     Allergies  Allergen Reactions   Morphine And Related Other (See Comments)    AGITATION, STRANGE DREAMS   Scopolamine Other (See Comments)    MENTAL CHANGES   Atorvastatin Other (See Comments)    unknown   Morphine Other (See Comments)    Disoriented, mood changes   Nsaids Other (See Comments)    unknown   Pravachol [Pravastatin Sodium] Other (See Comments)    unknown   Pravastatin Other (See Comments)    unknown   Teriparatide Other (See Comments)    unknown    Allergies as of 05/07/2021       Reactions   Morphine And Related Other (See Comments)   AGITATION, STRANGE DREAMS   Scopolamine Other (See Comments)   MENTAL  CHANGES   Atorvastatin Other (See Comments)   unknown   Morphine Other (See Comments)   Disoriented, mood changes   Nsaids Other (See Comments)   unknown   Pravachol [pravastatin Sodium] Other (See Comments)   unknown   Pravastatin Other (See Comments)   unknown   Teriparatide Other (See Comments)   unknown        Medication List        Accurate as of May 07, 2021 11:59 PM. If  you have any questions, ask your nurse or doctor.          acetaminophen 500 MG tablet Commonly known as: TYLENOL Take 500 mg by mouth at bedtime.   Artificial Tears 0.2-0.2-1 % Soln Generic drug: Glycerin-Hypromellose-PEG 400 Place 1 drop into both eyes 4 (four) times daily.   aspirin EC 81 MG tablet Take 81 mg by mouth daily.   cholecalciferol 25 MCG (1000 UNIT) tablet Commonly known as: VITAMIN D3 Take 1,000 Units by mouth daily.   fluticasone furoate-vilanterol 100-25 MCG/INH Aepb Commonly known as: BREO ELLIPTA Inhale 1 puff into the lungs daily.   lactose free nutrition Liqd Take 237 mLs by mouth daily.   magnesium hydroxide 400 MG/5ML suspension Commonly known as: MILK OF MAGNESIA Take 15 mLs by mouth at bedtime as needed for mild constipation.   memantine 10 MG tablet Commonly known as: NAMENDA Take 10 mg by mouth 2 (two) times daily.   metoprolol tartrate 25 MG tablet Commonly known as: LOPRESSOR Take 25 mg by mouth 2 (two) times daily.   mirtazapine 15 MG tablet Commonly known as: REMERON Take 7.5 mg by mouth at bedtime.   nitroGLYCERIN 0.4 MG SL tablet Commonly known as: NITROSTAT Place 0.4 mg under the tongue every 5 (five) minutes as needed for chest pain.   omeprazole 10 MG capsule Commonly known as: PRILOSEC Take 10 mg by mouth daily.   Systane Nighttime Oint Apply to both eyes at bedtime      ROS was provided with assistance of staff.   Review of Systems  Constitutional:  Positive for unexpected weight change. Negative for fatigue and fever.        Weight loss about #2-3 Ibs in the past a month or two  HENT:  Positive for hearing loss. Negative for congestion and trouble swallowing.   Eyes:  Negative for visual disturbance.  Respiratory:  Negative for cough.   Cardiovascular:  Negative for leg swelling.  Gastrointestinal:  Negative for abdominal pain and constipation.  Genitourinary:  Negative for dysuria and urgency.  Musculoskeletal:  Positive for arthralgias, back pain and gait problem.  Skin:  Negative for color change.  Neurological:  Negative for speech difficulty, weakness and light-headedness.       Dementia  Psychiatric/Behavioral:  Positive for confusion. Negative for behavioral problems and sleep disturbance. The patient is not nervous/anxious.    Immunization History  Administered Date(s) Administered   Influenza Whole 03/27/2018   Influenza, High Dose Seasonal PF 03/26/2019   Influenza-Unspecified 04/14/2017, 04/05/2020, 04/11/2021   Moderna SARS-COV2 Booster Vaccination 11/21/2020   Moderna Sars-Covid-2 Vaccination 07/24/2019, 08/21/2019, 05/02/2020   Pfizer Covid-19 Vaccine Bivalent Booster 34yrs & up 03/13/2021   Pneumococcal Conjugate-13 09/24/2017   Pneumococcal Polysaccharide-23 06/14/2013   Td 09/25/2017   Varicella 06/29/2003   Pertinent  Health Maintenance Due  Topic Date Due   INFLUENZA VACCINE  Completed   DEXA SCAN  Completed   Fall Risk 09/19/2017  Falls in the past year? No   Functional Status Survey:    Vitals:   05/07/21 1414  BP: (!) 150/79  Pulse: 65  Resp: 18  Temp: 97.7 F (36.5 C)  SpO2: 97%  Weight: 109 lb 14.4 oz (49.9 kg)  Height: 5\' 1"  (1.549 m)   Body mass index is 20.77 kg/m. Physical Exam Vitals and nursing note reviewed.  Constitutional:      Appearance: Normal appearance.  HENT:     Head: Normocephalic and atraumatic.  Eyes:     Extraocular  Movements: Extraocular movements intact.     Conjunctiva/sclera: Conjunctivae normal.     Pupils: Pupils are equal,  round, and reactive to light.  Cardiovascular:     Rate and Rhythm: Normal rate and regular rhythm.     Heart sounds: No murmur heard. Pulmonary:     Effort: Pulmonary effort is normal.     Breath sounds: No rales.  Abdominal:     Palpations: Abdomen is soft.     Tenderness: There is no abdominal tenderness.  Musculoskeletal:     Cervical back: Normal range of motion and neck supple.     Right lower leg: No edema.     Left lower leg: No edema.  Skin:    General: Skin is warm and dry.  Neurological:     General: No focal deficit present.     Mental Status: She is alert. Mental status is at baseline.     Gait: Gait abnormal.     Comments: Oriented to self.   Psychiatric:        Mood and Affect: Mood normal.        Behavior: Behavior normal.    Labs reviewed: Recent Labs    09/14/20 0000  NA 135*  K 4.5  CL 101  CO2 27*  BUN 17  CREATININE 0.7  CALCIUM 8.7   Recent Labs    09/14/20 0000 09/14/20 2227  AST  --  18  ALT  --  16  ALKPHOS  --  63  ALBUMIN 3.4*  --    Recent Labs    09/14/20 2227  WBC 5.4  HGB 11.6*  HCT 35*  PLT 233   Lab Results  Component Value Date   TSH 2.65 09/14/2020   Lab Results  Component Value Date   HGBA1C 5.8 06/29/2013   No results found for: CHOL, HDL, LDLCALC, LDLDIRECT, TRIG, CHOLHDL  Significant Diagnostic Results in last 30 days:  No results found.  Assessment/Plan Depression, recurrent ([place]) Sleeps/eats at her baseline, weight loss about #2-3Ibs in the past a mouth or two, takes Mirtazapine 7.5mg  qd. F/u dietitian.   Hypertension Blood pressure is controlled, continue Metoprolol.   Tachycardia Heart rate is in control, continue Metoprolol.   Osteoarthritis, multiple sites In general, continue Tylenol.   Vascular dementia ([place]) on Mamantine. No behavioral issues, self propels w/c to get around. TSH 2.65 09/21/20  GERD (gastroesophageal reflux disease)  stable, on Omeprazole, Hgb 12.4 09/21/20  Chronic  bronchitis (HCC) Stable,  takes Programmer, applications Communication: plan of care reviewed with the patient and charge nurse.   Labs/tests ordered:  none  Time spend 35 minutes.

## 2021-05-10 NOTE — Assessment & Plan Note (Signed)
stable, on Omeprazole, Hgb 12.4 09/21/20

## 2021-05-10 NOTE — Assessment & Plan Note (Signed)
on Mamantine. No behavioral issues, self propels w/c to get around. TSH 2.65 09/21/20

## 2021-05-10 NOTE — Assessment & Plan Note (Signed)
Sleeps/eats at her baseline, weight loss about #2-3Ibs in the past a mouth or two, takes Mirtazapine 7.5mg  qd. F/u dietitian.

## 2021-05-10 NOTE — Assessment & Plan Note (Signed)
Heart rate is in control, continue Metoprolol  

## 2021-05-10 NOTE — Assessment & Plan Note (Signed)
In general, continue Tylenol.

## 2021-05-10 NOTE — Assessment & Plan Note (Signed)
Blood pressure is controlled, continue Metoprolol. 

## 2021-05-10 NOTE — Assessment & Plan Note (Signed)
Stable,  takes [employer]

## 2021-05-25 ENCOUNTER — Non-Acute Institutional Stay (SKILLED_NURSING_FACILITY): Payer: Medicare Other | Admitting: Nurse Practitioner

## 2021-05-25 ENCOUNTER — Encounter: Payer: Self-pay | Admitting: Nurse Practitioner

## 2021-05-25 DIAGNOSIS — L03116 Cellulitis of left lower limb: Secondary | ICD-10-CM

## 2021-05-25 DIAGNOSIS — J410 Simple chronic bronchitis: Secondary | ICD-10-CM

## 2021-05-25 DIAGNOSIS — K219 Gastro-esophageal reflux disease without esophagitis: Secondary | ICD-10-CM

## 2021-05-25 DIAGNOSIS — M159 Polyosteoarthritis, unspecified: Secondary | ICD-10-CM

## 2021-05-25 DIAGNOSIS — F339 Major depressive disorder, recurrent, unspecified: Secondary | ICD-10-CM

## 2021-05-25 DIAGNOSIS — R000 Tachycardia, unspecified: Secondary | ICD-10-CM

## 2021-05-25 DIAGNOSIS — I10 Essential (primary) hypertension: Secondary | ICD-10-CM

## 2021-05-25 DIAGNOSIS — F01C18 Vascular dementia, severe, with other behavioral disturbance: Secondary | ICD-10-CM

## 2021-05-25 NOTE — Assessment & Plan Note (Signed)
1/3 left lower leg redness, swelling, tenderness, and warmth, developed from previous skin abrasion which is scabbed over. No pain in calf with the left foot dorsiflexion, DP pulse present.  Doxycycline 100mg  bid/Florastor bid x 7 days. Observe.

## 2021-05-25 NOTE — Assessment & Plan Note (Signed)
No behavioral issues, SNF [facility] for supportive care,  on Mamantine. Self propels w/c to get around. TSH 2.65 09/21/20

## 2021-05-25 NOTE — Assessment & Plan Note (Signed)
Stable, takes Breo Ellipta qd

## 2021-05-25 NOTE — Assessment & Plan Note (Signed)
Blood pressure is controlled, continue Metoprolol. 

## 2021-05-25 NOTE — Assessment & Plan Note (Signed)
Stable, continue Mirtazapine.  

## 2021-05-25 NOTE — Assessment & Plan Note (Signed)
Mild, Tylenol is effective.

## 2021-05-25 NOTE — Progress Notes (Signed)
Location:   [facility] Room Number: [place] of Service:  SNF [phone]) Provider: Lennie Odor Stasia Somero NP  [name], MD  Patient Care Team: [name], MD as PCP - General (Internal Medicine) [name], MD as Referring Physician (Ophthalmology) Gelene Recktenwald X, NP as Nurse Practitioner (Internal Medicine)  Extended Emergency Contact Information Primary Emergency Contact: [name] Address: [address]          [place], [place] [address] [name] of [place] Phone: [phone] Mobile Phone: [phone] Relation: Son Secondary Emergency Contact: [name] Address: [address]          [place], [place] [address] [name] of [place] Phone: [phone] Mobile Phone: [phone] Relation: Son  Code Status: DNR Goals of care: Advanced Directive information Advanced Directives 05/25/2021  Does Patient Have a Medical Advance Directive? Yes  Type of Paramedic of [place];Out of facility DNR (pink MOST or yellow form)  Does patient want to make changes to medical advance directive? No - Patient declined  Copy of [place] in Chart? Yes - validated most recent copy scanned in chart (See row information)  Would patient like information on creating a medical advance directive? -  Pre-existing out of facility DNR order (yellow form or pink MOST form) Yellow form placed in chart (order not valid for inpatient use);Pink MOST form placed in chart (order not valid for inpatient use)     Chief Complaint  Patient presents with   Acute Visit    Left leg redness     HPI:  Pt is a 87 y.o. female seen today for an acute visit for 1/3 left lower leg redness, swelling, tenderness, and warmth, developed from previous skin abrasion which is scabbed over. No pain in calf with the left foot dorsiflexion, DP pulse present.   Sleeps/eats at her baseline, weight loss about #2-3Ibs in the past a  mouth or two, takes Mirtazapine 7.5mg  qd.             HTN, takes Metoprolol.              Heart rate is in control, on Metoprolol.              OA pain is controlled, on Tylenol.               Her memory is preserved on Mamantine. No behavioral issues, self propels w/c to get around. TSH 2.65 09/21/20             GERD, stable, on Omeprazole, Hgb 12.4 09/21/20             Chronic bronchitis, takes Breo             COPD/chronic bronchitis, takes Breo Ellipta qd   Past Medical History:  Diagnosis Date   Anemia, iron deficiency    takes Ferrous Sulfate daily   Depression    takes Effexor daily   Gait disorder 04/21/2014   GERD (gastroesophageal reflux disease)    takes Omeprazole daily   Herpes ocular    history of-takes Acyclovir daily   High cholesterol    takes Atorvastatin daily   History of hiatal hernia    Hypertension    takes Metoprolol daily   Insomnia    takes Xanax nightly   Osteoarthritis of knee    bilateral   Sciatic pain    Past Surgical History:  Procedure Laterality Date   ABDOMINAL HYSTERECTOMY  1995  partial   CARPAL TUNNEL RELEASE Right 07/21/2007   CARPAL TUNNEL RELEASE Left 09/24/2007   COLONOSCOPY     DESCEMETS STRIPPING AUTOMATED ENDOTHELIAL KERATOPLASTY Left 03/14/2011   DESCEMETS STRIPPING AUTOMATED ENDOTHELIAL KERATOPLASTY Right 08/20/2012   EYE SURGERY Bilateral    cataract surgery   EYE SURGERY Left    corneal transplant   HERNIA REPAIR     HIP ARTHROPLASTY Left 06/13/2013   Procedure: ARTHROPLASTY BIPOLAR HIP; Injection left shoulder;  Surgeon: Johnny Bridge, MD;  Location: [facility];  Service: Orthopedics;  Laterality: Left;   JOINT REPLACEMENT     bilateral knees, left knee   KNEE ARTHROSCOPY Right 05/11/2001   KYPHOPLASTY N/A 04/10/2015   Procedure: T11 Kyphoplasty;  Surgeon: Jovita Gamma, MD;  Location: [facility];  Service: Neurosurgery;  Laterality: N/A;  T11 Kyphoplasty   KYPHOPLASTY N/A 05/06/2015   Procedure: KYPHOPLASTY Thoracic  twelve;  Surgeon: Jovita Gamma, MD;  Location: [facility];  Service: Neurosurgery;  Laterality: N/A;  T12 Kyphoplasty   LAPAROSCOPIC NISSEN FUNDOPLICATION  12/08/735   LUMBAR LAMINECTOMY/DECOMPRESSION MICRODISCECTOMY  08/29/2010   L2-S1   SHOULDER ARTHROSCOPY WITH ROTATOR CUFF REPAIR AND SUBACROMIAL DECOMPRESSION Right 01/01/2000   TONSILLECTOMY     as child   TOTAL HIP REVISION Left 07/08/2014   Procedure: TOTAL HIP REVISION;  Surgeon: Kerin Salen, MD;  Location: [facility];  Service: Orthopedics;  Laterality: Left;   TOTAL KNEE ARTHROPLASTY Left 05/14/2004   TOTAL KNEE ARTHROPLASTY  12/23/2011   Procedure: TOTAL KNEE ARTHROPLASTY;  Surgeon: Lorn Junes, MD;  Location: [facility];  Service: Orthopedics;  Laterality: Right;  DR Noemi Chapel WANTS 90 MINUTES FOR THIS CASE   TRIGGER FINGER RELEASE Right 07/21/2007   thumb   TRIGGER FINGER RELEASE Left 09/24/2007   middle finger   TRIGGER FINGER RELEASE Right 03/10/2008   ring and little fingers   TRIGGER FINGER RELEASE Right 09/02/2012   Procedure: RELEASE TRIGGER FINGER/A-1 PULLEY RIGHT INDEX FINGER;  Surgeon: Wynonia Sours, MD;  Location: [facility];  Service: Orthopedics;  Laterality: Right;   TRIGGER FINGER RELEASE Left 12/22/2012   Procedure: RELEASE TRIGGER FINGER/A-1 PULLEY LEFT RING FINGER;  Surgeon: Wynonia Sours, MD;  Location: [facility];  Service: Orthopedics;  Laterality: Left;  Left     Allergies  Allergen Reactions   Morphine And Related Other (See Comments)    AGITATION, STRANGE DREAMS   Scopolamine Other (See Comments)    MENTAL CHANGES   Atorvastatin Other (See Comments)    unknown   Morphine Other (See Comments)    Disoriented, mood changes   Nsaids Other (See Comments)    unknown   Pravachol [Pravastatin Sodium] Other (See Comments)    unknown   Pravastatin Other (See Comments)    unknown   Teriparatide Other (See Comments)    unknown    Allergies as of 05/25/2021       Reactions   Morphine  And Related Other (See Comments)   AGITATION, STRANGE DREAMS   Scopolamine Other (See Comments)   MENTAL CHANGES   Atorvastatin Other (See Comments)   unknown   Morphine Other (See Comments)   Disoriented, mood changes   Nsaids Other (See Comments)   unknown   Pravachol [pravastatin Sodium] Other (See Comments)   unknown   Pravastatin Other (See Comments)   unknown   Teriparatide Other (See Comments)   unknown        Medication List  Accurate as of May 25, 2021 11:59 PM. If you have any questions, ask your nurse or doctor.          acetaminophen 500 MG tablet Commonly known as: TYLENOL Take 500 mg by mouth at bedtime.   Artificial Tears 0.2-0.2-1 % Soln Generic drug: Glycerin-Hypromellose-PEG 400 Place 1 drop into both eyes 4 (four) times daily.   aspirin EC 81 MG tablet Take 81 mg by mouth daily.   cholecalciferol 25 MCG (1000 UNIT) tablet Commonly known as: VITAMIN D3 Take 1,000 Units by mouth daily.   fluticasone furoate-vilanterol 100-25 MCG/INH Aepb Commonly known as: BREO ELLIPTA Inhale 1 puff into the lungs daily.   lactose free nutrition Liqd Take 237 mLs by mouth daily.   magnesium hydroxide 400 MG/5ML suspension Commonly known as: MILK OF MAGNESIA Take 15 mLs by mouth at bedtime as needed for mild constipation.   memantine 10 MG tablet Commonly known as: NAMENDA Take 10 mg by mouth 2 (two) times daily.   metoprolol tartrate 25 MG tablet Commonly known as: LOPRESSOR Take 25 mg by mouth 2 (two) times daily.   mirtazapine 15 MG tablet Commonly known as: REMERON Take 7.5 mg by mouth at bedtime.   nitroGLYCERIN 0.4 MG SL tablet Commonly known as: NITROSTAT Place 0.4 mg under the tongue every 5 (five) minutes as needed for chest pain.   omeprazole 10 MG capsule Commonly known as: PRILOSEC Take 10 mg by mouth daily.   Systane Nighttime Oint Apply to both eyes at bedtime        Review of Systems  Constitutional:  Negative  for activity change, appetite change, fever and unexpected weight change.       Weight loss about #2-3 Ibs in the past a month or two  HENT:  Positive for hearing loss. Negative for congestion and trouble swallowing.   Eyes:  Negative for visual disturbance.  Respiratory:  Negative for cough.   Cardiovascular:  Positive for leg swelling.  Gastrointestinal:  Negative for abdominal pain and constipation.  Genitourinary:  Negative for dysuria and urgency.  Musculoskeletal:  Positive for arthralgias, back pain and gait problem.  Skin:  Positive for wound.  Neurological:  Negative for speech difficulty, weakness and light-headedness.       Dementia  Psychiatric/Behavioral:  Positive for confusion. Negative for behavioral problems and sleep disturbance. The patient is not nervous/anxious.    Immunization History  Administered Date(s) Administered   Influenza Whole 03/27/2018   Influenza, High Dose Seasonal PF 03/26/2019   Influenza-Unspecified 04/14/2017, 04/05/2020, 04/11/2021   Moderna SARS-COV2 Booster Vaccination 11/21/2020   Moderna Sars-Covid-2 Vaccination 07/24/2019, 08/21/2019, 05/02/2020   Pfizer Covid-19 Vaccine Bivalent Booster 36yrs & up 03/13/2021   Pneumococcal Conjugate-13 09/24/2017   Pneumococcal Polysaccharide-23 06/14/2013   Td 09/25/2017   Varicella 06/29/2003   Pertinent  Health Maintenance Due  Topic Date Due   INFLUENZA VACCINE  Completed   DEXA SCAN  Completed   Fall Risk 09/19/2017  Falls in the past year? No   Functional Status Survey:    Vitals:   05/25/21 1444  BP: 126/60  Pulse: 74  Resp: (!) 22  Temp: 98 F (36.7 C)  SpO2: 94%  Weight: 111 lb 3.2 oz (50.4 kg)  Height: 5\' 1"  (1.549 m)   Body mass index is 21.01 kg/m. Physical Exam Vitals and nursing note reviewed.  Constitutional:      Appearance: Normal appearance.  HENT:     Head: Normocephalic and atraumatic.  Eyes:  Extraocular Movements: Extraocular movements intact.      Conjunctiva/sclera: Conjunctivae normal.     Pupils: Pupils are equal, round, and reactive to light.  Cardiovascular:     Rate and Rhythm: Normal rate and regular rhythm.     Heart sounds: No murmur heard. Pulmonary:     Effort: Pulmonary effort is normal.     Breath sounds: No rales.  Abdominal:     Palpations: Abdomen is soft.     Tenderness: There is no abdominal tenderness.  Musculoskeletal:     Cervical back: Normal range of motion and neck supple.     Right lower leg: Edema present.     Left lower leg: No edema.  Skin:    General: Skin is warm and dry.     Findings: Erythema present.     Comments: 1/3 left lower leg redness, swelling, tenderness, and warmth, developed from previous skin abrasion which is scabbed over. No pain in calf with the left foot dorsiflexion, DP pulse present.    Neurological:     General: No focal deficit present.     Mental Status: She is alert. Mental status is at baseline.     Gait: Gait abnormal.     Comments: Oriented to self.   Psychiatric:        Mood and Affect: Mood normal.        Behavior: Behavior normal.    Labs reviewed: Recent Labs    09/14/20 0000  NA 135*  K 4.5  CL 101  CO2 27*  BUN 17  CREATININE 0.7  CALCIUM 8.7   Recent Labs    09/14/20 0000 09/14/20 2227  AST  --  18  ALT  --  16  ALKPHOS  --  63  ALBUMIN 3.4*  --    Recent Labs    09/14/20 2227  WBC 5.4  HGB 11.6*  HCT 35*  PLT 233   Lab Results  Component Value Date   TSH 2.65 09/14/2020   Lab Results  Component Value Date   HGBA1C 5.8 06/29/2013   No results found for: CHOL, HDL, LDLCALC, LDLDIRECT, TRIG, CHOLHDL  Significant Diagnostic Results in last 30 days:  No results found.  Assessment/Plan: Cellulitis of left leg without foot 1/3 left lower leg redness, swelling, tenderness, and warmth, developed from previous skin abrasion which is scabbed over. No pain in calf with the left foot dorsiflexion, DP pulse present.  Doxycycline 100mg   bid/Florastor bid x 7 days. Observe.   Hypertension Blood pressure is controlled, continue Metoprolol.   Chronic bronchitis (HCC) Stable, takes Breo Ellipta qd  GERD (gastroesophageal reflux disease) stable, on Omeprazole, Hgb 12.4 09/21/20  Vascular dementia (HCC) No behavioral issues, SNF [facility] for supportive care,  on Mamantine. Self propels w/c to get around. TSH 2.65 09/21/20  Osteoarthritis, multiple sites Mild, Tylenol is effective.   Depression, recurrent ([place]) Stable, continue Mirtazapine.   Tachycardia Heart rate is in control, continue Metoprolol.     Family/ staff Communication: plan of care reviewed with the patient and charge nurse.   Labs/tests ordered:  none  Time spend 35 minutes.

## 2021-05-25 NOTE — Assessment & Plan Note (Signed)
Heart rate is in control, continue Metoprolol  

## 2021-05-25 NOTE — Assessment & Plan Note (Signed)
stable, on Omeprazole, Hgb 12.4 09/21/20

## 2021-05-29 ENCOUNTER — Encounter: Payer: Self-pay | Admitting: Nurse Practitioner

## 2021-05-29 ENCOUNTER — Non-Acute Institutional Stay (SKILLED_NURSING_FACILITY): Payer: Medicare Other | Admitting: Internal Medicine

## 2021-05-29 ENCOUNTER — Encounter: Payer: Self-pay | Admitting: Internal Medicine

## 2021-05-29 DIAGNOSIS — L03116 Cellulitis of left lower limb: Secondary | ICD-10-CM

## 2021-05-29 DIAGNOSIS — J410 Simple chronic bronchitis: Secondary | ICD-10-CM

## 2021-05-29 DIAGNOSIS — F339 Major depressive disorder, recurrent, unspecified: Secondary | ICD-10-CM

## 2021-05-29 DIAGNOSIS — F01C18 Vascular dementia, severe, with other behavioral disturbance: Secondary | ICD-10-CM

## 2021-05-29 DIAGNOSIS — I10 Essential (primary) hypertension: Secondary | ICD-10-CM | POA: Diagnosis not present

## 2021-05-29 DIAGNOSIS — K219 Gastro-esophageal reflux disease without esophagitis: Secondary | ICD-10-CM | POA: Diagnosis not present

## 2021-05-29 DIAGNOSIS — D509 Iron deficiency anemia, unspecified: Secondary | ICD-10-CM

## 2021-05-29 NOTE — Progress Notes (Signed)
Location:   [facility] Room Number: 42 Place of Service:  SNF [phone]) Provider:  Veleta Miners MD  [name], MD  Patient Care Team: [name], MD as PCP - General (Internal Medicine) [name], MD as Referring Physician (Ophthalmology) Mast, Man X, NP as Nurse Practitioner (Internal Medicine)  Extended Emergency Contact Information Primary Emergency Contact: [name] Address: [address]          [place], [place] [address] [name] of [place] Phone: [phone] Mobile Phone: [phone] Relation: Son Secondary Emergency Contact: [name] Address: [address]          [place], [place] [address] [name] of [place] Phone: [phone] Mobile Phone: [phone] Relation: Son  Code Status:  DNR Goals of care: Advanced Directive information Advanced Directives 05/29/2021  Does Patient Have a Medical Advance Directive? Yes  Type of Paramedic of [place];Out of facility DNR (pink MOST or yellow form)  Does patient want to make changes to medical advance directive? No - Patient declined  Copy of [place] in Chart? Yes - validated most recent copy scanned in chart (See row information)  Would patient like information on creating a medical advance directive? -  Pre-existing out of facility DNR order (yellow form or pink MOST form) Yellow form placed in chart (order not valid for inpatient use);Pink MOST form placed in chart (order not valid for inpatient use)     Chief Complaint  Patient presents with   Medical Management of Chronic Issues   Quality Metric Gaps    Shingrix    HPI:  Pt is a 87 y.o. female seen today for medical management of chronic diseases.    Patient has a history of Hypertension, anemia, depression, Hyperlipidemia , COPD, hyponatremia,Depression with Anxiety and Dementia And Covid Pneumonia   Recent diagnosis of Cellulitis of Left  Lower leg Treated with Doxycyline Leg Much better still tender  She is stable. No new Nursing issues. No Behavior issues Her weight is stable Walks with her walker No Falls Wt Readings from Last 3 Encounters:  05/29/21 111 lb 3.2 oz (50.4 kg)  05/25/21 111 lb 3.2 oz (50.4 kg)  05/07/21 109 lb 14.4 oz (49.9 kg)   Past Medical History:  Diagnosis Date   Anemia, iron deficiency    takes Ferrous Sulfate daily   Depression    takes Effexor daily   Gait disorder 04/21/2014   GERD (gastroesophageal reflux disease)    takes Omeprazole daily   Herpes ocular    history of-takes Acyclovir daily   High cholesterol    takes Atorvastatin daily   History of hiatal hernia    Hypertension    takes Metoprolol daily   Insomnia    takes Xanax nightly   Osteoarthritis of knee    bilateral   Sciatic pain    Past Surgical History:  Procedure Laterality Date   ABDOMINAL HYSTERECTOMY  1995   partial   CARPAL TUNNEL RELEASE Right 07/21/2007   CARPAL TUNNEL RELEASE Left 09/24/2007   COLONOSCOPY     DESCEMETS STRIPPING AUTOMATED ENDOTHELIAL KERATOPLASTY Left 03/14/2011   DESCEMETS STRIPPING AUTOMATED ENDOTHELIAL KERATOPLASTY Right 08/20/2012   EYE SURGERY Bilateral    cataract surgery   EYE SURGERY Left    corneal transplant   HERNIA REPAIR     HIP ARTHROPLASTY Left 06/13/2013   Procedure: ARTHROPLASTY BIPOLAR HIP; Injection left shoulder;  Surgeon: Johnny Bridge, MD;  Location: [facility];  Service: Orthopedics;  Laterality: Left;   JOINT REPLACEMENT     bilateral knees, left knee   KNEE ARTHROSCOPY Right 05/11/2001   KYPHOPLASTY N/A 04/10/2015   Procedure: T11 Kyphoplasty;  Surgeon: Jovita Gamma, MD;  Location: [facility];  Service: Neurosurgery;  Laterality: N/A;  T11 Kyphoplasty   KYPHOPLASTY N/A 05/06/2015   Procedure: KYPHOPLASTY Thoracic twelve;  Surgeon: Jovita Gamma, MD;  Location: [facility];  Service: Neurosurgery;  Laterality: N/A;  T12 Kyphoplasty   LAPAROSCOPIC NISSEN  FUNDOPLICATION  9/51/8841   LUMBAR LAMINECTOMY/DECOMPRESSION MICRODISCECTOMY  08/29/2010   L2-S1   SHOULDER ARTHROSCOPY WITH ROTATOR CUFF REPAIR AND SUBACROMIAL DECOMPRESSION Right 01/01/2000   TONSILLECTOMY     as child   TOTAL HIP REVISION Left 07/08/2014   Procedure: TOTAL HIP REVISION;  Surgeon: Kerin Salen, MD;  Location: [facility];  Service: Orthopedics;  Laterality: Left;   TOTAL KNEE ARTHROPLASTY Left 05/14/2004   TOTAL KNEE ARTHROPLASTY  12/23/2011   Procedure: TOTAL KNEE ARTHROPLASTY;  Surgeon: Lorn Junes, MD;  Location: [facility];  Service: Orthopedics;  Laterality: Right;  DR Noemi Chapel WANTS 90 MINUTES FOR THIS CASE   TRIGGER FINGER RELEASE Right 07/21/2007   thumb   TRIGGER FINGER RELEASE Left 09/24/2007   middle finger   TRIGGER FINGER RELEASE Right 03/10/2008   ring and little fingers   TRIGGER FINGER RELEASE Right 09/02/2012   Procedure: RELEASE TRIGGER FINGER/A-1 PULLEY RIGHT INDEX FINGER;  Surgeon: Wynonia Sours, MD;  Location: [facility];  Service: Orthopedics;  Laterality: Right;   TRIGGER FINGER RELEASE Left 12/22/2012   Procedure: RELEASE TRIGGER FINGER/A-1 PULLEY LEFT RING FINGER;  Surgeon: Wynonia Sours, MD;  Location: [facility];  Service: Orthopedics;  Laterality: Left;  Left     Allergies  Allergen Reactions   Morphine And Related Other (See Comments)    AGITATION, STRANGE DREAMS   Scopolamine Other (See Comments)    MENTAL CHANGES   Atorvastatin Other (See Comments)    unknown   Morphine Other (See Comments)    Disoriented, mood changes   Nsaids Other (See Comments)    unknown   Pravachol [Pravastatin Sodium] Other (See Comments)    unknown   Pravastatin Other (See Comments)    unknown   Teriparatide Other (See Comments)    unknown    Allergies as of 05/29/2021       Reactions   Morphine And Related Other (See Comments)   AGITATION, STRANGE DREAMS   Scopolamine Other (See Comments)   MENTAL CHANGES   Atorvastatin Other (See  Comments)   unknown   Morphine Other (See Comments)   Disoriented, mood changes   Nsaids Other (See Comments)   unknown   Pravachol [pravastatin Sodium] Other (See Comments)   unknown   Pravastatin Other (See Comments)   unknown   Teriparatide Other (See Comments)   unknown        Medication List        Accurate as of May 29, 2021  2:14 PM. If you have any questions, ask your nurse or doctor.          acetaminophen 500 MG tablet Commonly known as: TYLENOL Take 500 mg by mouth at bedtime.   Artificial Tears 0.2-0.2-1 % Soln Generic drug: Glycerin-Hypromellose-PEG 400 Place 1 drop into both eyes 4 (four) times daily.   aspirin EC 81 MG tablet Take 81 mg by mouth daily.   cholecalciferol 25 MCG (1000 UNIT) tablet Commonly known as: VITAMIN  D3 Take 1,000 Units by mouth daily.   doxycycline 100 MG tablet Commonly known as: VIBRA-TABS Take 100 mg by mouth 2 (two) times daily.   fluticasone furoate-vilanterol 100-25 MCG/INH Aepb Commonly known as: BREO ELLIPTA Inhale 1 puff into the lungs daily.   lactose free nutrition Liqd Take 237 mLs by mouth daily.   magnesium hydroxide 400 MG/5ML suspension Commonly known as: MILK OF MAGNESIA Take 15 mLs by mouth at bedtime as needed for mild constipation.   memantine 10 MG tablet Commonly known as: NAMENDA Take 10 mg by mouth 2 (two) times daily.   metoprolol tartrate 25 MG tablet Commonly known as: LOPRESSOR Take 25 mg by mouth 2 (two) times daily.   mirtazapine 15 MG tablet Commonly known as: REMERON Take 7.5 mg by mouth at bedtime.   nitroGLYCERIN 0.4 MG SL tablet Commonly known as: NITROSTAT Place 0.4 mg under the tongue every 5 (five) minutes as needed for chest pain.   omeprazole 10 MG capsule Commonly known as: PRILOSEC Take 10 mg by mouth daily.   saccharomyces boulardii 250 MG capsule Commonly known as: FLORASTOR Take 250 mg by mouth 2 (two) times daily.   Systane Nighttime Oint Apply to  both eyes at bedtime        Review of Systems  Unable to perform ROS: Dementia   Immunization History  Administered Date(s) Administered   Influenza Whole 03/27/2018   Influenza, High Dose Seasonal PF 03/26/2019   Influenza-Unspecified 04/14/2017, 04/05/2020, 04/11/2021   Moderna SARS-COV2 Booster Vaccination 11/21/2020   Moderna Sars-Covid-2 Vaccination 07/24/2019, 08/21/2019, 05/02/2020   Pfizer Covid-19 Vaccine Bivalent Booster 73yrs & up 03/13/2021   Pneumococcal Conjugate-13 09/24/2017   Pneumococcal Polysaccharide-23 06/14/2013   Td 09/25/2017   Varicella 06/29/2003   Pertinent  Health Maintenance Due  Topic Date Due   INFLUENZA VACCINE  Completed   DEXA SCAN  Completed   Fall Risk 09/19/2017  Falls in the past year? No   Functional Status Survey:    Vitals:   05/29/21 1327  BP: 114/70  Pulse: 60  Resp: 18  Temp: 97.8 F (36.6 C)  SpO2: 95%  Weight: 111 lb 3.2 oz (50.4 kg)  Height: 5\' 1"  (1.549 m)   Body mass index is 21.01 kg/m. Physical Exam Vitals reviewed.  Constitutional:      Appearance: Normal appearance.  HENT:     Head: Normocephalic.     Nose: Nose normal.     Mouth/Throat:     Mouth: Mucous membranes are moist.     Pharynx: Oropharynx is clear.  Eyes:     Pupils: Pupils are equal, round, and reactive to light.  Cardiovascular:     Rate and Rhythm: Normal rate and regular rhythm.     Pulses: Normal pulses.     Heart sounds: Normal heart sounds. No murmur heard. Pulmonary:     Effort: Pulmonary effort is normal.     Breath sounds: Normal breath sounds.  Abdominal:     General: Abdomen is flat. Bowel sounds are normal.     Palpations: Abdomen is soft.  Musculoskeletal:        General: No swelling.     Cervical back: Neck supple.  Skin:    General: Skin is warm.     Comments: Left Lower leg  Not red some swelling and tender  Neurological:     General: No focal deficit present.     Mental Status: She is alert.  Psychiatric:  Mood and Affect: Mood normal.        Thought Content: Thought content normal.    Labs reviewed: Recent Labs    09/14/20 0000  NA 135*  K 4.5  CL 101  CO2 27*  BUN 17  CREATININE 0.7  CALCIUM 8.7   Recent Labs    09/14/20 0000 09/14/20 2227  AST  --  18  ALT  --  16  ALKPHOS  --  63  ALBUMIN 3.4*  --    Recent Labs    09/14/20 2227  WBC 5.4  HGB 11.6*  HCT 35*  PLT 233   Lab Results  Component Value Date   TSH 2.65 09/14/2020   Lab Results  Component Value Date   HGBA1C 5.8 06/29/2013   No results found for: CHOL, HDL, LDLCALC, LDLDIRECT, TRIG, CHOLHDL  Significant Diagnostic Results in last 30 days:  No results found.  Assessment/Plan 1. Cellulitis of left leg without foot Finished treatment with Doxycyline Will continue to monitor Looks better  2. Primary hypertension On Lopressor  3. Simple chronic bronchitis ([place]) Continue Breo  4. Gastroesophageal reflux disease, unspecified whether esophagitis present Continue Prilosec  5. Severe vascular dementia with other behavioral disturbance Stays good on Namenda  6. Depression, recurrent ([place]) On Remeron  7. Iron deficiency anemia, unspecified iron deficiency anemia type Continue Iron    Family/ staff Communication:   Labs/tests ordered:  CBC,CMP

## 2021-06-08 LAB — COMPREHENSIVE METABOLIC PANEL
Albumin: 3.2 — AB (ref 3.5–5.0)
Calcium: 8.6 — AB (ref 8.7–10.7)
Globulin: 2.3

## 2021-06-08 LAB — CBC AND DIFFERENTIAL
HCT: 33 — AB (ref 36–46)
Hemoglobin: 11.3 — AB (ref 12.0–16.0)
Platelets: 220 (ref 150–399)
WBC: 5.4

## 2021-06-08 LAB — BASIC METABOLIC PANEL
BUN: 12 (ref 4–21)
CO2: 27 — AB (ref 13–22)
Chloride: 100 (ref 99–108)
Creatinine: 0.6 (ref 0.5–1.1)
Glucose: 82
Potassium: 4.5 (ref 3.4–5.3)
Sodium: 134 — AB (ref 137–147)

## 2021-06-08 LAB — CBC: RBC: 3.55 — AB (ref 3.87–5.11)

## 2021-07-06 ENCOUNTER — Non-Acute Institutional Stay (SKILLED_NURSING_FACILITY): Payer: Medicare Other | Admitting: Nurse Practitioner

## 2021-07-06 ENCOUNTER — Encounter: Payer: Self-pay | Admitting: Nurse Practitioner

## 2021-07-06 DIAGNOSIS — I10 Essential (primary) hypertension: Secondary | ICD-10-CM

## 2021-07-06 DIAGNOSIS — E871 Hypo-osmolality and hyponatremia: Secondary | ICD-10-CM

## 2021-07-06 DIAGNOSIS — J410 Simple chronic bronchitis: Secondary | ICD-10-CM

## 2021-07-06 DIAGNOSIS — F339 Major depressive disorder, recurrent, unspecified: Secondary | ICD-10-CM

## 2021-07-06 DIAGNOSIS — R000 Tachycardia, unspecified: Secondary | ICD-10-CM | POA: Diagnosis not present

## 2021-07-06 DIAGNOSIS — F01C18 Vascular dementia, severe, with other behavioral disturbance: Secondary | ICD-10-CM

## 2021-07-06 DIAGNOSIS — K219 Gastro-esophageal reflux disease without esophagitis: Secondary | ICD-10-CM

## 2021-07-06 DIAGNOSIS — M159 Polyosteoarthritis, unspecified: Secondary | ICD-10-CM

## 2021-07-06 NOTE — Progress Notes (Signed)
Location:  [place] Room Number: N042-B Place of Service:  SNF (31) Provider:  Marlana Latus, NP   Patient Care Team: [name], MD as PCP - General (Internal Medicine) [name], MD as Referring Physician (Ophthalmology) Cashae Weich X, NP as Nurse Practitioner (Internal Medicine)  Extended Emergency Contact Information Primary Emergency Contact: [name] Address: [address]          [place], [place] [address] [name] of [place] Phone: [phone] Mobile Phone: [phone] Relation: Son Secondary Emergency Contact: [name] Address: [address]          [place], [place] [address] [name] of [place] Phone: [phone] Mobile Phone: [phone] Relation: Son  Code Status:  DNR Goals of care: Advanced Directive information Advanced Directives 07/06/2021  Does Patient Have a Medical Advance Directive? Yes  Type of Paramedic of [place];Out of facility DNR (pink MOST or yellow form)  Does patient want to make changes to medical advance directive? No - Patient declined  Copy of [place] in Chart? Yes - validated most recent copy scanned in chart (See row information)  Would patient like information on creating a medical advance directive? -  Pre-existing out of facility DNR order (yellow form or pink MOST form) Yellow form placed in chart (order not valid for inpatient use);Pink MOST form placed in chart (order not valid for inpatient use)     Chief Complaint  Patient presents with   Medical Management of Chronic Issues    Routine visit and discuss need for shingrix or post pone if patient refuses     HPI:  Pt is a 88 y.o. female seen today for medical management of chronic diseases.      Sleeps/eats at her baseline, wt is stable,  takes Mirtazapine 7.5mg  qd.             HTN, takes Metoprolol. Bun/creat 12/0.6 06/08/21  Hyponatremia, Na 134 06/08/21              Heart rate is in control, on Metoprolol.              OA pain is controlled, on Tylenol.               Her memory is preserved on Mamantine. No behavioral issues, self propels w/c to get around. TSH 2.65 09/21/20             GERD, stable, on Omeprazole, Hgb 11.3 06/08/21             COPD/chronic bronchitis, takes Breo Ellipta qd  Past Medical History:  Diagnosis Date   Anemia, iron deficiency    takes Ferrous Sulfate daily   Depression    takes Effexor daily   Gait disorder 04/21/2014   GERD (gastroesophageal reflux disease)    takes Omeprazole daily   Herpes ocular    history of-takes Acyclovir daily   High cholesterol    takes Atorvastatin daily   History of hiatal hernia    Hypertension    takes Metoprolol daily   Insomnia    takes Xanax nightly   Osteoarthritis of knee    bilateral   Sciatic pain    Past Surgical History:  Procedure Laterality Date   ABDOMINAL HYSTERECTOMY  1995   partial   CARPAL TUNNEL RELEASE Right 07/21/2007   CARPAL TUNNEL RELEASE Left 09/24/2007   COLONOSCOPY     DESCEMETS STRIPPING AUTOMATED ENDOTHELIAL KERATOPLASTY Left 03/14/2011   DESCEMETS  STRIPPING AUTOMATED ENDOTHELIAL KERATOPLASTY Right 08/20/2012   EYE SURGERY Bilateral    cataract surgery   EYE SURGERY Left    corneal transplant   HERNIA REPAIR     HIP ARTHROPLASTY Left 06/13/2013   Procedure: ARTHROPLASTY BIPOLAR HIP; Injection left shoulder;  Surgeon: Johnny Bridge, MD;  Location: [place];  Service: Orthopedics;  Laterality: Left;   JOINT REPLACEMENT     bilateral knees, left knee   KNEE ARTHROSCOPY Right 05/11/2001   KYPHOPLASTY N/A 04/10/2015   Procedure: T11 Kyphoplasty;  Surgeon: Jovita Gamma, MD;  Location: [facility];  Service: Neurosurgery;  Laterality: N/A;  T11 Kyphoplasty   KYPHOPLASTY N/A 05/06/2015   Procedure: KYPHOPLASTY Thoracic twelve;  Surgeon: Jovita Gamma, MD;  Location: [place] [facility];  Service: Neurosurgery;  Laterality: N/A;  T12 Kyphoplasty    LAPAROSCOPIC NISSEN FUNDOPLICATION  02/19/9370   LUMBAR LAMINECTOMY/DECOMPRESSION MICRODISCECTOMY  08/29/2010   L2-S1   SHOULDER ARTHROSCOPY WITH ROTATOR CUFF REPAIR AND SUBACROMIAL DECOMPRESSION Right 01/01/2000   TONSILLECTOMY     as child   TOTAL HIP REVISION Left 07/08/2014   Procedure: TOTAL HIP REVISION;  Surgeon: Kerin Salen, MD;  Location: [facility];  Service: Orthopedics;  Laterality: Left;   TOTAL KNEE ARTHROPLASTY Left 05/14/2004   TOTAL KNEE ARTHROPLASTY  12/23/2011   Procedure: TOTAL KNEE ARTHROPLASTY;  Surgeon: Lorn Junes, MD;  Location: [facility];  Service: Orthopedics;  Laterality: Right;  DR Noemi Chapel WANTS 90 MINUTES FOR THIS CASE   TRIGGER FINGER RELEASE Right 07/21/2007   thumb   TRIGGER FINGER RELEASE Left 09/24/2007   middle finger   TRIGGER FINGER RELEASE Right 03/10/2008   ring and little fingers   TRIGGER FINGER RELEASE Right 09/02/2012   Procedure: RELEASE TRIGGER FINGER/A-1 PULLEY RIGHT INDEX FINGER;  Surgeon: Wynonia Sours, MD;  Location: [facility];  Service: Orthopedics;  Laterality: Right;   TRIGGER FINGER RELEASE Left 12/22/2012   Procedure: RELEASE TRIGGER FINGER/A-1 PULLEY LEFT RING FINGER;  Surgeon: Wynonia Sours, MD;  Location: [facility];  Service: Orthopedics;  Laterality: Left;  Left     Allergies  Allergen Reactions   Morphine And Related Other (See Comments)    AGITATION, STRANGE DREAMS   Scopolamine Other (See Comments)    MENTAL CHANGES   Atorvastatin Other (See Comments)    unknown   Morphine Other (See Comments)    Disoriented, mood changes   Nsaids Other (See Comments)    unknown   Pravachol [Pravastatin Sodium] Other (See Comments)    unknown   Pravastatin Other (See Comments)    unknown   Teriparatide Other (See Comments)    unknown    Outpatient Encounter Medications as of 07/06/2021  Medication Sig   acetaminophen (TYLENOL) 500 MG tablet Take 500 mg by mouth at bedtime.   aspirin EC 81 MG tablet Take 81 mg by  mouth daily.    cholecalciferol (VITAMIN D3) 25 MCG (1000 UNIT) tablet Take 1,000 Units by mouth daily.    fluticasone furoate-vilanterol (BREO ELLIPTA) 100-25 MCG/INH AEPB Inhale 1 puff into the lungs daily.   Glycerin-Hypromellose-PEG 400 (ARTIFICIAL TEARS) 0.2-0.2-1 % SOLN Place 1 drop into both eyes 4 (four) times daily.   magnesium hydroxide (MILK OF MAGNESIA) 400 MG/5ML suspension Take 15 mLs by mouth at bedtime as needed for mild constipation.    memantine (NAMENDA) 10 MG tablet Take 10 mg by mouth 2 (two) times daily.    metoprolol tartrate (LOPRESSOR) 25 MG tablet Take 25  mg by mouth 2 (two) times daily.   mirtazapine (REMERON) 7.5 MG tablet Take 7.5 mg by mouth at bedtime.   nitroGLYCERIN (NITROSTAT) 0.4 MG SL tablet Place 0.4 mg under the tongue every 5 (five) minutes as needed for chest pain.   omeprazole (PRILOSEC) 10 MG capsule Take 10 mg by mouth daily.   White Petrolatum-Mineral Oil (SYSTANE NIGHTTIME) OINT Apply to both eyes at bedtime   [DISCONTINUED] lactose free nutrition (BOOST) LIQD Take 237 mLs by mouth daily.   [DISCONTINUED] mirtazapine (REMERON) 15 MG tablet Take 7.5 mg by mouth at bedtime.    No facility-administered encounter medications on file as of 07/06/2021.    Review of Systems  Unable to perform ROS: Dementia   Immunization History  Administered Date(s) Administered   Influenza Whole 03/27/2018   Influenza, High Dose Seasonal PF 03/26/2019   Influenza-Unspecified 04/14/2017, 04/05/2020, 04/11/2021   Moderna SARS-COV2 Booster Vaccination 11/21/2020   Moderna Sars-Covid-2 Vaccination 07/24/2019, 08/21/2019, 05/02/2020   Pfizer Covid-19 Vaccine Bivalent Booster 39yrs & up 03/13/2021   Pneumococcal Conjugate-13 09/24/2017   Pneumococcal Polysaccharide-23 06/14/2013   Td 09/25/2017   Varicella 06/29/2003   Pertinent  Health Maintenance Due  Topic Date Due   INFLUENZA VACCINE  Completed   DEXA SCAN  Completed   Fall Risk 09/19/2017  Falls in the past  year? No   Functional Status Survey:    Vitals:   07/06/21 0957  BP: 124/68  Pulse: 66  Resp: 18  Temp: 98 F (36.7 C)  SpO2: 95%  Weight: 110 lb 14.4 oz (50.3 kg)  Height: 5\' 1"  (1.549 m)   Body mass index is 20.95 kg/m. Physical Exam Vitals and nursing note reviewed.  Constitutional:      Appearance: Normal appearance.  HENT:     Head: Normocephalic and atraumatic.  Eyes:     Extraocular Movements: Extraocular movements intact.     Conjunctiva/sclera: Conjunctivae normal.     Pupils: Pupils are equal, round, and reactive to light.  Cardiovascular:     Rate and Rhythm: Normal rate and regular rhythm.     Heart sounds: No murmur heard. Pulmonary:     Effort: Pulmonary effort is normal.     Breath sounds: No rales.  Abdominal:     Palpations: Abdomen is soft.     Tenderness: There is no abdominal tenderness.  Musculoskeletal:     Cervical back: Normal range of motion and neck supple.     Right lower leg: No edema.     Left lower leg: No edema.  Skin:    General: Skin is warm and dry.     Findings: No erythema.     Comments:    Neurological:     General: No focal deficit present.     Mental Status: She is alert. Mental status is at baseline.     Gait: Gait abnormal.     Comments: Oriented to self.   Psychiatric:        Mood and Affect: Mood normal.        Behavior: Behavior normal.    Labs reviewed: Recent Labs    09/14/20 0000 06/08/21 0000  NA 135* 134*  K 4.5 4.5  CL 101 100  CO2 27* 27*  BUN 17 12  CREATININE 0.7 0.6  CALCIUM 8.7 8.6*   Recent Labs    09/14/20 0000 09/14/20 2227 06/08/21 0000  AST  --  18  --   ALT  --  16  --  ALKPHOS  --  63  --   ALBUMIN 3.4*  --  3.2*   Recent Labs    09/14/20 2227 06/08/21 0000  WBC 5.4 5.4  HGB 11.6* 11.3*  HCT 35* 33*  PLT 233 220   Lab Results  Component Value Date   TSH 2.65 09/14/2020   Lab Results  Component Value Date   HGBA1C 5.8 06/29/2013   No results found for: CHOL,  HDL, LDLCALC, LDLDIRECT, TRIG, CHOLHDL  Significant Diagnostic Results in last 30 days:  No results found.  Assessment/Plan Hyponatremia Not new, at baseline, last  Na 134 06/08/21(down to 127 2019)  Tachycardia Heart rate is in control, continue Metoprolol.   Hypertension Blood pressure is controlled,  takes Metoprolol. Bun/creat 12/0.6 06/08/21  Depression, recurrent (HCC) Sleeps/eats at her baseline, wt is stable,  takes Mirtazapine 7.5mg  qd.  Osteoarthritis, multiple sites Uses w/c for mobility, takes Tylenol.   Vascular dementia [facility])  Her memory is preserved on Mamantine. No behavioral issues, self propels w/c to get around. TSH 2.65 09/21/20  GERD (gastroesophageal reflux disease) stable, on Omeprazole, Hgb 11.3 06/08/21  Chronic bronchitis ([place])  takes Breo Ellipta qd     Family/ staff Communication: plan of care reviewed with the patient and charge nurse.   Labs/tests ordered: none  Time spend 35 minutes.

## 2021-07-09 ENCOUNTER — Encounter: Payer: Self-pay | Admitting: Nurse Practitioner

## 2021-07-09 NOTE — Assessment & Plan Note (Signed)
takes Breo Ellipta qd

## 2021-07-09 NOTE — Assessment & Plan Note (Signed)
Sleeps/eats at her baseline, wt is stable,  takes Mirtazapine 7.5mg  qd.

## 2021-07-09 NOTE — Assessment & Plan Note (Signed)
Blood pressure is controlled,  takes Metoprolol. Bun/creat 12/0.6 06/08/21

## 2021-07-09 NOTE — Assessment & Plan Note (Signed)
Uses w/c for mobility, takes Tylenol.

## 2021-07-09 NOTE — Assessment & Plan Note (Signed)
Her memory is preserved on Mamantine. No behavioral issues, self propels w/c to get around. TSH 2.65 09/21/20

## 2021-07-09 NOTE — Assessment & Plan Note (Signed)
Heart rate is in control, continue Metoprolol  

## 2021-07-09 NOTE — Assessment & Plan Note (Signed)
stable, on Omeprazole, Hgb 11.3 06/08/21

## 2021-07-09 NOTE — Assessment & Plan Note (Signed)
Not new, at baseline, last  Na 134 06/08/21(down to 127 2019)

## 2021-07-27 ENCOUNTER — Non-Acute Institutional Stay (SKILLED_NURSING_FACILITY): Payer: Medicare Other | Admitting: Nurse Practitioner

## 2021-07-27 DIAGNOSIS — E871 Hypo-osmolality and hyponatremia: Secondary | ICD-10-CM

## 2021-07-27 DIAGNOSIS — F339 Major depressive disorder, recurrent, unspecified: Secondary | ICD-10-CM

## 2021-07-27 DIAGNOSIS — J410 Simple chronic bronchitis: Secondary | ICD-10-CM

## 2021-07-27 DIAGNOSIS — R000 Tachycardia, unspecified: Secondary | ICD-10-CM

## 2021-07-27 DIAGNOSIS — M5416 Radiculopathy, lumbar region: Secondary | ICD-10-CM

## 2021-07-27 DIAGNOSIS — K219 Gastro-esophageal reflux disease without esophagitis: Secondary | ICD-10-CM | POA: Diagnosis not present

## 2021-07-27 DIAGNOSIS — F01C18 Vascular dementia, severe, with other behavioral disturbance: Secondary | ICD-10-CM

## 2021-07-27 DIAGNOSIS — I10 Essential (primary) hypertension: Secondary | ICD-10-CM

## 2021-07-27 NOTE — Progress Notes (Signed)
Location:   SNF [place] Room Number: 42B Place of Service:  SNF (31) Provider: [facility] [name]  [name], MD  Patient Care Team: [name], MD as PCP - General (Internal Medicine) [name], MD as Referring Physician (Ophthalmology) [name], NP as Nurse Practitioner (Internal Medicine)  Extended Emergency Contact Information Primary Emergency Contact: [name] Address: [address]          [place], [place] [address] [name] Phone: [phone] Mobile Phone: [phone] Relation: Son Secondary Emergency Contact: [name] Address: [address]          [place], [place] [address] [name] Phone: [phone] Mobile Phone: [phone] Relation: Son  Code Status:  DNR Goals of care: Advanced Directive information Advanced Directives 07/06/2021  Does Patient Have a Medical Advance Directive? Yes  Type of Paramedic of [place];Out of facility DNR (pink MOST or yellow form)  Does patient want to make changes to medical advance directive? No - Patient declined  Copy of [place] in Chart? Yes - validated most recent copy scanned in chart (See row information)  Would patient like information on creating a medical advance directive? -  Pre-existing out of facility DNR order (yellow form or pink MOST form) Yellow form placed in chart (order not valid for inpatient use);Pink MOST form placed in chart (order not valid for inpatient use)     Chief Complaint  Patient presents with   Medical Management of Chronic Issues    HPI:  Pt is a 88 y.o. female seen today for medical management of chronic diseases.                 Sleeps/eats at her baseline, wt is stable,  takes Mirtazapine 7.5mg  qd.             HTN, takes Metoprolol. Bun/creat 12/0.6 06/08/21             Hyponatremia, Na 134 06/08/21             Heart rate is in control, on Metoprolol.               OA pain is controlled, on Tylenol, uses w/c for mobility              Her memory is preserved on Mamantine. No behavioral issues, self propels w/c to get around. TSH 2.65 09/21/20             GERD, stable, on Omeprazole, Hgb 11.3 06/08/21             COPD/chronic bronchitis, takes Breo Ellipta qd   Past Medical History:  Diagnosis Date   Anemia, iron deficiency    takes Ferrous Sulfate daily   Depression    takes Effexor daily   Gait disorder 04/21/2014   GERD (gastroesophageal reflux disease)    takes Omeprazole daily   Herpes ocular    history of-takes Acyclovir daily   High cholesterol    takes Atorvastatin daily   History of hiatal hernia    Hypertension    takes Metoprolol daily   Insomnia    takes Xanax nightly   Osteoarthritis of knee    bilateral   Sciatic pain    Past Surgical History:  Procedure Laterality Date   ABDOMINAL HYSTERECTOMY  1995   partial   CARPAL TUNNEL RELEASE Right 07/21/2007   CARPAL TUNNEL RELEASE Left 09/24/2007   COLONOSCOPY  DESCEMETS STRIPPING AUTOMATED ENDOTHELIAL KERATOPLASTY Left 03/14/2011   DESCEMETS STRIPPING AUTOMATED ENDOTHELIAL KERATOPLASTY Right 08/20/2012   EYE SURGERY Bilateral    cataract surgery   EYE SURGERY Left    corneal transplant   HERNIA REPAIR     HIP ARTHROPLASTY Left 06/13/2013   Procedure: ARTHROPLASTY BIPOLAR HIP; Injection left shoulder;  Surgeon: Johnny Bridge, MD;  Location: [place];  Service: Orthopedics;  Laterality: Left;   JOINT REPLACEMENT     bilateral knees, left knee   KNEE ARTHROSCOPY Right 05/11/2001   KYPHOPLASTY N/A 04/10/2015   Procedure: T11 Kyphoplasty;  Surgeon: Jovita Gamma, MD;  Location: [place] [facility];  Service: Neurosurgery;  Laterality: N/A;  T11 Kyphoplasty   KYPHOPLASTY N/A 05/06/2015   Procedure: KYPHOPLASTY Thoracic twelve;  Surgeon: Jovita Gamma, MD;  Location: [place] [facility];  Service: Neurosurgery;  Laterality: N/A;  T12 Kyphoplasty   LAPAROSCOPIC NISSEN FUNDOPLICATION   01/10/9469   LUMBAR LAMINECTOMY/DECOMPRESSION MICRODISCECTOMY  08/29/2010   L2-S1   SHOULDER ARTHROSCOPY WITH ROTATOR CUFF REPAIR AND SUBACROMIAL DECOMPRESSION Right 01/01/2000   TONSILLECTOMY     as child   TOTAL HIP REVISION Left 07/08/2014   Procedure: TOTAL HIP REVISION;  Surgeon: Kerin Salen, MD;  Location: [facility];  Service: Orthopedics;  Laterality: Left;   TOTAL KNEE ARTHROPLASTY Left 05/14/2004   TOTAL KNEE ARTHROPLASTY  12/23/2011   Procedure: TOTAL KNEE ARTHROPLASTY;  Surgeon: Lorn Junes, MD;  Location: [place];  Service: Orthopedics;  Laterality: Right;  DR Noemi Chapel WANTS 90 MINUTES FOR THIS CASE   TRIGGER FINGER RELEASE Right 07/21/2007   thumb   TRIGGER FINGER RELEASE Left 09/24/2007   middle finger   TRIGGER FINGER RELEASE Right 03/10/2008   ring and little fingers   TRIGGER FINGER RELEASE Right 09/02/2012   Procedure: RELEASE TRIGGER FINGER/A-1 PULLEY RIGHT INDEX FINGER;  Surgeon: Wynonia Sours, MD;  Location: [place];  Service: Orthopedics;  Laterality: Right;   TRIGGER FINGER RELEASE Left 12/22/2012   Procedure: RELEASE TRIGGER FINGER/A-1 PULLEY LEFT RING FINGER;  Surgeon: Wynonia Sours, MD;  Location: [facility];  Service: Orthopedics;  Laterality: Left;  Left     Allergies  Allergen Reactions   Morphine And Related Other (See Comments)    AGITATION, STRANGE DREAMS   Scopolamine Other (See Comments)    MENTAL CHANGES   Atorvastatin Other (See Comments)    unknown   Morphine Other (See Comments)    Disoriented, mood changes   Nsaids Other (See Comments)    unknown   Pravachol [Pravastatin Sodium] Other (See Comments)    unknown   Pravastatin Other (See Comments)    unknown   Teriparatide Other (See Comments)    unknown    Allergies as of 07/27/2021       Reactions   Morphine And Related Other (See Comments)   AGITATION, STRANGE DREAMS   Scopolamine Other (See Comments)   MENTAL CHANGES   Atorvastatin Other (See Comments)   unknown    Morphine Other (See Comments)   Disoriented, mood changes   Nsaids Other (See Comments)   unknown   Pravachol [pravastatin Sodium] Other (See Comments)   unknown   Pravastatin Other (See Comments)   unknown   Teriparatide Other (See Comments)   unknown        Medication List        Accurate as of July 27, 2021 11:59 PM. If you have any questions, ask your nurse or doctor.  acetaminophen 500 MG tablet Commonly known as: TYLENOL Take 500 mg by mouth at bedtime.   Artificial Tears 0.2-0.2-1 % Soln Generic drug: Glycerin-Hypromellose-PEG 400 Place 1 drop into both eyes 4 (four) times daily.   aspirin EC 81 MG tablet Take 81 mg by mouth daily.   cholecalciferol 25 MCG (1000 UNIT) tablet Commonly known as: VITAMIN D3 Take 1,000 Units by mouth daily.   fluticasone furoate-vilanterol 100-25 MCG/INH Aepb Commonly known as: BREO ELLIPTA Inhale 1 puff into the lungs daily.   magnesium hydroxide 400 MG/5ML suspension Commonly known as: MILK OF MAGNESIA Take 15 mLs by mouth at bedtime as needed for mild constipation.   memantine 10 MG tablet Commonly known as: NAMENDA Take 10 mg by mouth 2 (two) times daily.   metoprolol tartrate 25 MG tablet Commonly known as: LOPRESSOR Take 25 mg by mouth 2 (two) times daily.   mirtazapine 7.5 MG tablet Commonly known as: REMERON Take 7.5 mg by mouth at bedtime.   nitroGLYCERIN 0.4 MG SL tablet Commonly known as: NITROSTAT Place 0.4 mg under the tongue every 5 (five) minutes as needed for chest pain.   omeprazole 10 MG capsule Commonly known as: PRILOSEC Take 10 mg by mouth daily.   Systane Nighttime Oint Apply to both eyes at bedtime        Review of Systems  Unable to perform ROS: Dementia   Immunization History  Administered Date(s) Administered   Influenza Whole 03/27/2018   Influenza, High Dose Seasonal PF 03/26/2019   Influenza-Unspecified 04/14/2017, 04/05/2020, 04/11/2021   Moderna SARS-COV2  Booster Vaccination 11/21/2020   Moderna Sars-Covid-2 Vaccination 07/24/2019, 08/21/2019, 05/02/2020   Pfizer Covid-19 Vaccine Bivalent Booster 48yrs & up 03/13/2021   Pneumococcal Conjugate-13 09/24/2017   Pneumococcal Polysaccharide-23 06/14/2013   Td 09/25/2017   Varicella 06/29/2003   Pertinent  Health Maintenance Due  Topic Date Due   INFLUENZA VACCINE  Completed   DEXA SCAN  Completed   Fall Risk 09/19/2017  Falls in the past year? No   Functional Status Survey:    Vitals:   07/27/21 1208  BP: 116/63  Pulse: 81  Resp: 18  Temp: (!) 97.5 F (36.4 C)  SpO2: 93%  Weight: 109 lb (49.4 kg)   Body mass index is 20.6 kg/m. Physical Exam Vitals and nursing note reviewed.  Constitutional:      Appearance: Normal appearance.  HENT:     Head: Normocephalic and atraumatic.     Mouth/Throat:     Mouth: Mucous membranes are moist.  Eyes:     Extraocular Movements: Extraocular movements intact.     Conjunctiva/sclera: Conjunctivae normal.     Pupils: Pupils are equal, round, and reactive to light.  Cardiovascular:     Rate and Rhythm: Normal rate and regular rhythm.     Heart sounds: No murmur heard. Pulmonary:     Effort: Pulmonary effort is normal.     Breath sounds: No rales.  Abdominal:     Palpations: Abdomen is soft.     Tenderness: There is no abdominal tenderness.  Musculoskeletal:     Cervical back: Normal range of motion and neck supple.     Right lower leg: No edema.     Left lower leg: No edema.  Skin:    General: Skin is warm and dry.     Findings: No erythema.     Comments:    Neurological:     General: No focal deficit present.     Mental Status: She is  alert. Mental status is at baseline.     Gait: Gait abnormal.     Comments: Oriented to self.   Psychiatric:        Mood and Affect: Mood normal.        Behavior: Behavior normal.    Labs reviewed: Recent Labs    09/14/20 0000 06/08/21 0000  NA 135* 134*  K 4.5 4.5  CL 101 100  CO2  27* 27*  BUN 17 12  CREATININE 0.7 0.6  CALCIUM 8.7 8.6*   Recent Labs    09/14/20 0000 09/14/20 2227 06/08/21 0000  AST  --  18  --   ALT  --  16  --   ALKPHOS  --  63  --   ALBUMIN 3.4*  --  3.2*   Recent Labs    09/14/20 2227 06/08/21 0000  WBC 5.4 5.4  HGB 11.6* 11.3*  HCT 35* 33*  PLT 233 220   Lab Results  Component Value Date   TSH 2.65 09/14/2020   Lab Results  Component Value Date   HGBA1C 5.8 06/29/2013   No results found for: CHOL, HDL, LDLCALC, LDLDIRECT, TRIG, CHOLHDL  Significant Diagnostic Results in last 30 days:  No results found.  Assessment/Plan  GERD (gastroesophageal reflux disease) stable, on Omeprazole, Hgb 11.3 06/08/21  Chronic bronchitis (HCC) Stable,  takes Breo Ellipta qd  Vascular dementia [facility]) Her memory is preserved on Mamantine. No behavioral issues, self propels w/c to get around. TSH 2.65 09/21/20  Lumbar radiculopathy, chronic pain is controlled, on Tylenol, uses w/c for mobility   Tachycardia Heart rte is in control, continue Metoprolol.   Hyponatremia  Na 134 06/08/21  Hypertension Blood pressure is controlled, takes Metoprolol. Bun/creat 12/0.6 06/08/21  Depression, recurrent (HCC) Her mood is stable, in her usual state of mentation, wt is stable,  takes Mirtazapine 7.5mg  qd.   Family/ staff Communication: plan of care reviewed with the patient and charge nurse.   Labs/tests ordered:  none  Time spend 35 minutes.

## 2021-07-27 NOTE — Assessment & Plan Note (Signed)
Blood pressure is controlled, takes Metoprolol. Bun/creat 12/0.6 06/08/21

## 2021-07-27 NOTE — Assessment & Plan Note (Signed)
Her memory is preserved on Mamantine. No behavioral issues, self propels w/c to get around. TSH 2.65 09/21/20

## 2021-07-27 NOTE — Assessment & Plan Note (Signed)
pain is controlled, on Tylenol, uses w/c for mobility

## 2021-07-27 NOTE — Assessment & Plan Note (Signed)
Heart rte is in control, continue Metoprolol.

## 2021-07-27 NOTE — Assessment & Plan Note (Signed)
Na 134 06/08/21

## 2021-07-27 NOTE — Assessment & Plan Note (Signed)
stable, on Omeprazole, Hgb 11.3 06/08/21

## 2021-07-27 NOTE — Assessment & Plan Note (Signed)
Her mood is stable, in her usual state of mentation, wt is stable,  takes Mirtazapine 7.5mg  qd.

## 2021-07-27 NOTE — Assessment & Plan Note (Signed)
Stable,  takes Breo Ellipta qd

## 2021-07-30 ENCOUNTER — Encounter: Payer: Self-pay | Admitting: Nurse Practitioner

## 2021-08-16 ENCOUNTER — Encounter: Payer: Self-pay | Admitting: Nurse Practitioner

## 2021-08-16 ENCOUNTER — Non-Acute Institutional Stay (INDEPENDENT_AMBULATORY_CARE_PROVIDER_SITE_OTHER): Payer: Medicare Other | Admitting: Nurse Practitioner

## 2021-08-16 DIAGNOSIS — Z0000 Encounter for general adult medical examination without abnormal findings: Secondary | ICD-10-CM

## 2021-08-16 DIAGNOSIS — F01C18 Vascular dementia, severe, with other behavioral disturbance: Secondary | ICD-10-CM | POA: Diagnosis not present

## 2021-08-16 NOTE — Progress Notes (Addendum)
Subjective:   Kristin Solomon is a 88 y.o. female who presents for Medicare Annual (Subsequent) preventive examination at [facility] Cardiac Risk Factors include: advanced age (>35men, >33 women)     Objective:    Today's Vitals   08/16/21 1037  BP: 108/64  Pulse: 62  Resp: 18  Temp: 98.2 F (36.8 C)  SpO2: 95%  Weight: 109 lb 3.2 oz (49.5 kg)  Height: 5\' 1"  (1.549 m)   Body mass index is 20.63 kg/m.  Advanced Directives 08/16/2021 07/06/2021 05/29/2021 05/25/2021 05/07/2021 03/12/2021 02/07/2021  Does Patient Have a Medical Advance Directive? Yes Yes Yes Yes Yes Yes Yes  Type of Advance Directive Out of facility DNR (pink MOST or yellow form);Living will;Healthcare Power of [place];Out of facility DNR (pink MOST or yellow form) [place];Out of facility DNR (pink MOST or yellow form) [place];Out of facility DNR (pink MOST or yellow form) [place];Out of facility DNR (pink MOST or yellow form) [place];Out of facility DNR (pink MOST or yellow form) [place];Out of facility DNR (pink MOST or yellow form)  Does patient want to make changes to medical advance directive? No - Patient declined No - Patient declined No - Patient declined No - Patient declined No - Patient declined No - Patient declined No - Patient declined  Copy of [place] in Chart? Yes - validated most recent copy scanned in chart (See row information) Yes - validated most recent copy scanned in chart (See row information) Yes - validated most recent copy scanned in chart (See row information) Yes - validated most recent copy scanned in chart (See row information) Yes - validated most recent copy scanned in chart (See row information) Yes - validated most recent copy scanned in chart (See row information) Yes - validated most recent copy scanned in chart (See row information)   Would patient like information on creating a medical advance directive? - - - - - - -  Pre-existing out of facility DNR order (yellow form or pink MOST form) Pink MOST form placed in chart (order not valid for inpatient use);Yellow form placed in chart (order not valid for inpatient use) Yellow form placed in chart (order not valid for inpatient use);Pink MOST form placed in chart (order not valid for inpatient use) Yellow form placed in chart (order not valid for inpatient use);Pink MOST form placed in chart (order not valid for inpatient use) Yellow form placed in chart (order not valid for inpatient use);Pink MOST form placed in chart (order not valid for inpatient use) Yellow form placed in chart (order not valid for inpatient use);Pink MOST form placed in chart (order not valid for inpatient use) Yellow form placed in chart (order not valid for inpatient use);Pink MOST form placed in chart (order not valid for inpatient use) Yellow form placed in chart (order not valid for inpatient use);Pink MOST form placed in chart (order not valid for inpatient use)    Current Medications (verified) Outpatient Encounter Medications as of 08/16/2021  Medication Sig   acetaminophen (TYLENOL) 500 MG tablet Take 500 mg by mouth at bedtime.   aspirin EC 81 MG tablet Take 81 mg by mouth daily.    cholecalciferol (VITAMIN D3) 25 MCG (1000 UNIT) tablet Take 1,000 Units by mouth daily.    fluticasone furoate-vilanterol (BREO ELLIPTA) 100-25 MCG/INH AEPB Inhale 1 puff into the lungs daily.   Glycerin-Hypromellose-PEG 400 (ARTIFICIAL  TEARS) 0.2-0.2-1 % SOLN Place 1 drop into both eyes 4 (four) times daily.   magnesium hydroxide (MILK OF MAGNESIA) 400 MG/5ML suspension Take 15 mLs by mouth at bedtime as needed for mild constipation.    memantine (NAMENDA) 10 MG tablet Take 10 mg by mouth 2 (two) times daily.    metoprolol tartrate (LOPRESSOR) 25 MG tablet Take 25 mg by mouth 2 (two) times daily.   mirtazapine (REMERON)  7.5 MG tablet Take 7.5 mg by mouth at bedtime.   nitroGLYCERIN (NITROSTAT) 0.4 MG SL tablet Place 0.4 mg under the tongue every 5 (five) minutes as needed for chest pain.   omeprazole (PRILOSEC) 10 MG capsule Take 10 mg by mouth daily.   White Petrolatum-Mineral Oil (SYSTANE NIGHTTIME) OINT Apply to both eyes at bedtime   No facility-administered encounter medications on file as of 08/16/2021.    Allergies (verified) Morphine and related, Scopolamine, Atorvastatin, Morphine, Nsaids, Pravachol [pravastatin sodium], Pravastatin, and Teriparatide   History: Past Medical History:  Diagnosis Date   Anemia, iron deficiency    takes Ferrous Sulfate daily   Depression    takes Effexor daily   Gait disorder 04/21/2014   GERD (gastroesophageal reflux disease)    takes Omeprazole daily   Herpes ocular    history of-takes Acyclovir daily   High cholesterol    takes Atorvastatin daily   History of hiatal hernia    Hypertension    takes Metoprolol daily   Insomnia    takes Xanax nightly   Osteoarthritis of knee    bilateral   Sciatic pain    Past Surgical History:  Procedure Laterality Date   ABDOMINAL HYSTERECTOMY  1995   partial   CARPAL TUNNEL RELEASE Right 07/21/2007   CARPAL TUNNEL RELEASE Left 09/24/2007   COLONOSCOPY     DESCEMETS STRIPPING AUTOMATED ENDOTHELIAL KERATOPLASTY Left 03/14/2011   DESCEMETS STRIPPING AUTOMATED ENDOTHELIAL KERATOPLASTY Right 08/20/2012   EYE SURGERY Bilateral    cataract surgery   EYE SURGERY Left    corneal transplant   HERNIA REPAIR     HIP ARTHROPLASTY Left 06/13/2013   Procedure: ARTHROPLASTY BIPOLAR HIP; Injection left shoulder;  Surgeon: Johnny Bridge, MD;  Location: [place];  Service: Orthopedics;  Laterality: Left;   JOINT REPLACEMENT     bilateral knees, left knee   KNEE ARTHROSCOPY Right 05/11/2001   KYPHOPLASTY N/A 04/10/2015   Procedure: T11 Kyphoplasty;  Surgeon: Jovita Gamma, MD;  Location: [place] [facility];  Service: Neurosurgery;   Laterality: N/A;  T11 Kyphoplasty   KYPHOPLASTY N/A 05/06/2015   Procedure: KYPHOPLASTY Thoracic twelve;  Surgeon: Jovita Gamma, MD;  Location: [place] [facility];  Service: Neurosurgery;  Laterality: N/A;  T12 Kyphoplasty   LAPAROSCOPIC NISSEN FUNDOPLICATION  0/96/2836   LUMBAR LAMINECTOMY/DECOMPRESSION MICRODISCECTOMY  08/29/2010   L2-S1   SHOULDER ARTHROSCOPY WITH ROTATOR CUFF REPAIR AND SUBACROMIAL DECOMPRESSION Right 01/01/2000   TONSILLECTOMY     as child   TOTAL HIP REVISION Left 07/08/2014   Procedure: TOTAL HIP REVISION;  Surgeon: Kerin Salen, MD;  Location: [place];  Service: Orthopedics;  Laterality: Left;   TOTAL KNEE ARTHROPLASTY Left 05/14/2004   TOTAL KNEE ARTHROPLASTY  12/23/2011   Procedure: TOTAL KNEE ARTHROPLASTY;  Surgeon: Lorn Junes, MD;  Location: [place];  Service: Orthopedics;  Laterality: Right;  DR Noemi Chapel WANTS 90 MINUTES FOR THIS CASE   TRIGGER FINGER RELEASE Right 07/21/2007   thumb   TRIGGER FINGER RELEASE Left 09/24/2007   middle finger   TRIGGER FINGER  RELEASE Right 03/10/2008   ring and little fingers   TRIGGER FINGER RELEASE Right 09/02/2012   Procedure: RELEASE TRIGGER FINGER/A-1 PULLEY RIGHT INDEX FINGER;  Surgeon: Wynonia Sours, MD;  Location: [place];  Service: Orthopedics;  Laterality: Right;   TRIGGER FINGER RELEASE Left 12/22/2012   Procedure: RELEASE TRIGGER FINGER/A-1 PULLEY LEFT RING FINGER;  Surgeon: Wynonia Sours, MD;  Location: [place];  Service: Orthopedics;  Laterality: Left;  Left    Family History  Problem Relation Age of Onset   Heart attack Mother    Parkinsonism Father    Diabetes Brother    Social History   Socioeconomic History   Marital status: Married    Spouse name: Not on file   Number of children: Not on file   Years of education: Not on file   Highest education level: Not on file  Occupational History   Not on file  Tobacco Use   Smoking status: Former   Smokeless tobacco: Never   Tobacco  comments:    quit smoking 20-30 yr. ago  Vaping Use   Vaping Use: Never used  Substance and Sexual Activity   Alcohol use: No   Drug use: Yes   Sexual activity: Not Currently  Other Topics Concern   Not on file  Social History Narrative   Not on file   Social Determinants of Health   Financial Resource Strain: Not on file  Food Insecurity: Not on file  Transportation Needs: Not on file  Physical Activity: Not on file  Stress: Not on file  Social Connections: Not on file    Tobacco Counseling Counseling given: Not Answered Tobacco comments: quit smoking 20-30 yr. ago   Clinical Intake:  Pre-visit preparation completed: Yes  Pain : No/denies pain     BMI - recorded: 20.63 Nutritional Status: BMI of 19-24  Normal Nutritional Risks: None Diabetes: No  How often do you need to have someone help you when you read instructions, pamphlets, or other written materials from your doctor or pharmacy?: 4 - Often What is the last grade level you completed in school?: college  Diabetic?no  Interpreter Needed?: No  Information entered by :: Britain Anagnos Bretta Bang NP   Activities of Daily Living In your present state of health, do you have any difficulty performing the following activities: 08/16/2021  Hearing? Y  Vision? N  Difficulty concentrating or making decisions? Y  Walking or climbing stairs? Y  Dressing or bathing? Y  Doing errands, shopping? Y  Preparing Food and eating ? N  Using the Toilet? N  In the past six months, have you accidently leaked urine? Y  Do you have problems with loss of bowel control? N  Managing your Medications? Y  Managing your Finances? Y  Housekeeping or managing your Housekeeping? Y  Some recent data might be hidden    Patient Care Team: [name], MD as PCP - General (Internal Medicine) [name], MD as Referring Physician (Ophthalmology) Minah Axelrod X, NP as Nurse Practitioner (Internal Medicine)  Indicate any recent  Medical Services you may have received from other than [facility] providers in the past year (date may be approximate).     Assessment:   This is a routine wellness examination for [place].  Hearing/Vision screen No results found.  Dietary issues and exercise activities discussed: Current Exercise Habits: The patient does not participate in regular exercise at present, Exercise limited by: orthopedic condition(s);psychological condition(s)   Goals Addressed  This Visit's Progress    function       Evidence-based guidance:  Emphasize the importance of physical activity and aerobic exercise as included in treatment plan; assess barriers to adherence; consider patient's abilities and preferences.  Encourage gradual increase in activity or exercise instead of stopping if pain occurs.  Reinforce individual therapy exercise prescription, such as strengthening, stabilization and stretching programs.  Promote optimal body mechanics to stabilize the spine with lifting and functional activity.  Encourage activity and mobility modifications to facilitate optimal function, such as using a log roll for bed mobility or dressing from a seated position.  Reinforce individual adaptive equipment recommendations to limit excessive spinal movements, such as a Systems analyst.  Assess adequacy of sleep; encourage use of sleep hygiene techniques, such as bedtime routine; use of white noise; dark, cool bedroom; avoiding daytime naps, heavy meals or exercise before bedtime.  Promote positions and modification to optimize sleep and sexual activity; consider pillows or positioning devices to assist in maintaining neutral spine.  Explore options for applying ergonomic principles at work and home, such as frequent position changes, using ergonomically designed equipment and working at optimal height.  Promote modifications to increase comfort with driving such as lumbar support, optimizing seat and  steering wheel position, using cruise control and taking frequent rest stops to stretch and walk.   Notes:        Depression Screen PHQ 2/9 Scores 09/19/2017  PHQ - 2 Score 0    Fall Risk Fall Risk  09/19/2017  Falls in the past year? No    FALL RISK PREVENTION PERTAINING TO THE HOME:  Any stairs in or around the home? Yes  If so, are there any without handrails? No  Home free of loose throw rugs in walkways, pet beds, electrical cords, etc? Yes  Adequate lighting in your home to reduce risk of falls? Yes   ASSISTIVE DEVICES UTILIZED TO PREVENT FALLS:  Life alert? No  Use of a cane, walker or w/c? Yes  Grab bars in the bathroom? Yes  Shower chair or bench in shower? Yes  Elevated toilet seat or a handicapped toilet? Yes   TIMED UP AND GO:  Was the test performed? No . No longer ambulatory   Cognitive Function: MMSE - Mini Mental State Exam 08/28/2021 10/06/2017 09/19/2017  Orientation to time 0 0 0  Orientation to Place 3 5 4   Registration 3 3 3   Attention/ Calculation 3 0 3  Recall 0 0 0  Language- name 2 objects 2 2 2   Language- repeat 1 1 1   Language- follow 3 step command 3 3 3   Language- read & follow direction 1 1 1   Write a sentence 1 1 0  Copy design 0 1 0  Total score 17 17 17         Immunizations Immunization History  Administered Date(s) Administered   Influenza Whole 03/27/2018   Influenza, High Dose Seasonal PF 03/26/2019   Influenza-Unspecified 04/14/2017, 04/05/2020, 04/11/2021   Moderna SARS-COV2 Booster Vaccination 11/21/2020   Moderna Sars-Covid-2 Vaccination 07/24/2019, 08/21/2019, 05/02/2020   Pfizer Covid-19 Vaccine Bivalent Booster 38yrs & up 03/13/2021   Pneumococcal Conjugate-13 09/24/2017   Pneumococcal Polysaccharide-23 06/14/2013   Td 09/25/2017   Varicella 06/29/2003    TDAP status: Up to date  Flu Vaccine status: Up to date  Pneumococcal vaccine status: Up to date  Covid-19 vaccine status: Completed vaccines  Qualifies  for Shingles Vaccine? Yes   Zostavax completed Yes   Shingrix Completed?: No.  Education has been provided regarding the importance of this vaccine. Patient has been advised to call insurance company to determine out of pocket expense if they have not yet received this vaccine. Advised may also receive vaccine at local pharmacy or Health Dept. Verbalized acceptance and understanding.  Screening Tests Health Maintenance  Topic Date Due   Zoster Vaccines- Shingrix (1 of 2) Never done   TETANUS/TDAP  09/26/2027   Pneumonia Vaccine 33+ Years old  Completed   INFLUENZA VACCINE  Completed   DEXA SCAN  Completed   COVID-19 Vaccine  Completed   HPV VACCINES  Aged Out    Health Maintenance  Health Maintenance Due  Topic Date Due   Zoster Vaccines- Shingrix (1 of 2) Never done    Colorectal cancer screening: No longer required.   Mammogram status: No longer required due to aged out.  Bone Density status: Ordered DEXA. Pt provided with contact info and advised to call to schedule appt.  Lung Cancer Screening: (Low Dose CT Chest recommended if Age 46-80 years, 30 pack-year currently smoking OR have quit w/in 15years.) does not qualify.   Additional Screening:  Hepatitis C Screening: does not qualify  Vision Screening: Recommended annual ophthalmology exams for early detection of glaucoma and other disorders of the eye. Is the patient up to date with their annual eye exam?  No  Who is the provider or what is the name of the office in which the patient attends annual eye exams? Will refer If pt is not established with a provider, would they like to be referred to a provider to establish care? No .   Dental Screening: Recommended annual dental exams for proper oral hygiene  Community Resource Referral / Chronic Care Management: CRR required this visit?  No   CCM required this visit?  No      Plan:   Recommended Eye Exam, MMSE, Shingrix.   I have personally reviewed and noted  the following in the patients chart:   Medical and social history Use of alcohol, tobacco or illicit drugs  Current medications and supplements including opioid prescriptions.  Functional ability and status Nutritional status Physical activity Advanced directives List of other physicians Hospitalizations, surgeries, and ER visits in previous 12 months Vitals Screenings to include cognitive, depression, and falls Referrals and appointments  In addition, I have reviewed and discussed with patient certain preventive protocols, quality metrics, and best practice recommendations. A written personalized care plan for preventive services as well as general preventive health recommendations were provided to patient.     Wynette Jersey X Sinjin Amero, NP   08/28/2021

## 2021-10-15 ENCOUNTER — Non-Acute Institutional Stay (SKILLED_NURSING_FACILITY): Payer: Medicare Other | Admitting: Nurse Practitioner

## 2021-10-15 ENCOUNTER — Encounter: Payer: Self-pay | Admitting: Nurse Practitioner

## 2021-10-15 DIAGNOSIS — M159 Polyosteoarthritis, unspecified: Secondary | ICD-10-CM | POA: Diagnosis not present

## 2021-10-15 DIAGNOSIS — E871 Hypo-osmolality and hyponatremia: Secondary | ICD-10-CM

## 2021-10-15 DIAGNOSIS — F01C18 Vascular dementia, severe, with other behavioral disturbance: Secondary | ICD-10-CM

## 2021-10-15 DIAGNOSIS — I10 Essential (primary) hypertension: Secondary | ICD-10-CM | POA: Diagnosis not present

## 2021-10-15 DIAGNOSIS — R000 Tachycardia, unspecified: Secondary | ICD-10-CM

## 2021-10-15 DIAGNOSIS — K219 Gastro-esophageal reflux disease without esophagitis: Secondary | ICD-10-CM

## 2021-10-15 DIAGNOSIS — J410 Simple chronic bronchitis: Secondary | ICD-10-CM

## 2021-10-15 NOTE — Progress Notes (Signed)
?Location:  [facility] ?Nursing Home Room Number: NO/42/B ?Place of Service:  SNF (31) ?Provider:  Elenora Fender, NP ? ?[name], MD ? ?Patient Care Team: ?[name], MD as PCP - General (Internal Medicine) ?[name], MD as Referring Physician (Ophthalmology) ?Caleyah Jr X, NP as Nurse Practitioner (Internal Medicine) ? ?Extended Emergency Contact Information ?Primary Emergency Contact: Scallan,James ?Address: [address] ?         [place], [place] [address] [place] ?Home Phone: [phone] ?Mobile Phone: [phone] ?Relation: Son ?Secondary Emergency Contact: Marro,Robert ?Address: [address] ?         [place], [place] [address] [place] ?Home Phone: [phone] ?Mobile Phone: [phone] ?Relation: Son ? ?Code Status:  DNR ?Goals of care: Advanced Directive information ? ?  10/15/2021  ? 11:08 AM  ?Advanced Directives  ?Does Patient Have a Medical Advance Directive? Yes  ?Type of Paramedic of [place];Living will  ?Does patient want to make changes to medical advance directive? No - Patient declined  ?Copy of [place] in Chart? Yes - validated most recent copy scanned in chart (See row information)  ?Pre-existing out of facility DNR order (yellow form or pink MOST form) Yellow form placed in chart (order not valid for inpatient use);Pink MOST form placed in chart (order not valid for inpatient use)  ? ? ? ?Chief Complaint  ?Patient presents with  ?? Medical Management of Chronic Issues  ?  Patient is here for routine chronic condition F/U  ? ? ?HPI:  ?Pt is a 88 y.o. female seen today for managing chronic medication conditions.  ?Sleeps/eats at her baseline, wt is stable,  takes Mirtazapine 7.'5mg'$  qd. ?            HTN, takes Metoprolol. Bun/creat 12/0.6 06/08/21 ?            Hyponatremia, Na 134 06/08/21 ?            Heart rate is in control, on Metoprolol.  ?            OA pain is controlled, on  Tylenol, uses w/c for mobility  ?            Her memory is preserved on Mamantine. No behavioral issues, self propels w/c to get around. TSH 2.65 09/21/20 ?            GERD, stable, on Omeprazole, Hgb 11.3 06/08/21 ?            COPD/chronic bronchitis, takes Breo Ellipta qd ? ? ?Past Medical History:  ?Diagnosis Date  ?? Anemia, iron deficiency   ? takes Ferrous Sulfate daily  ?? Depression   ? takes Effexor daily  ?? Gait disorder 04/21/2014  ?? GERD (gastroesophageal reflux disease)   ? takes Omeprazole daily  ?? Herpes ocular   ? history of-takes Acyclovir daily  ?? High cholesterol   ? takes Atorvastatin daily  ?? History of hiatal hernia   ?? Hypertension   ? takes Metoprolol daily  ?? Insomnia   ? takes Xanax nightly  ?? Osteoarthritis of knee   ? bilateral  ?? Sciatic pain   ? ?Past Surgical History:  ?Procedure Laterality Date  ?? ABDOMINAL HYSTERECTOMY  1995  ? partial  ?? CARPAL TUNNEL RELEASE Right 07/21/2007  ?? CARPAL TUNNEL RELEASE Left 09/24/2007  ?? COLONOSCOPY    ?? DESCEMETS STRIPPING AUTOMATED ENDOTHELIAL KERATOPLASTY Left 03/14/2011  ?? DESCEMETS STRIPPING AUTOMATED ENDOTHELIAL  KERATOPLASTY Right 08/20/2012  ?? EYE SURGERY Bilateral   ? cataract surgery  ?? EYE SURGERY Left   ? corneal transplant  ?? HERNIA REPAIR    ?? HIP ARTHROPLASTY Left 06/13/2013  ? Procedure: ARTHROPLASTY BIPOLAR HIP; Injection left shoulder;  Surgeon: Johnny Bridge, MD;  Location: [place];  Service: Orthopedics;  Laterality: Left;  ?? JOINT REPLACEMENT    ? bilateral knees, left knee  ?? KNEE ARTHROSCOPY Right 05/11/2001  ?? KYPHOPLASTY N/A 04/10/2015  ? Procedure: T11 Kyphoplasty;  Surgeon: Jovita Gamma, MD;  Location: [place] [facility];  Service: Neurosurgery;  Laterality: N/A;  T11 Kyphoplasty  ?? KYPHOPLASTY N/A 05/06/2015  ? Procedure: KYPHOPLASTY Thoracic twelve;  Surgeon: Jovita Gamma, MD;  Location: [place] [facility];  Service: Neurosurgery;  Laterality: N/A;  T12 Kyphoplasty  ?? LAPAROSCOPIC NISSEN FUNDOPLICATION   1/61/0960  ?? LUMBAR LAMINECTOMY/DECOMPRESSION MICRODISCECTOMY  08/29/2010  ? L2-S1  ?? SHOULDER ARTHROSCOPY WITH ROTATOR CUFF REPAIR AND SUBACROMIAL DECOMPRESSION Right 01/01/2000  ?? TONSILLECTOMY    ? as child  ?? TOTAL HIP REVISION Left 07/08/2014  ? Procedure: TOTAL HIP REVISION;  Surgeon: Kerin Salen, MD;  Location: [place];  Service: Orthopedics;  Laterality: Left;  ?? TOTAL KNEE ARTHROPLASTY Left 05/14/2004  ?? TOTAL KNEE ARTHROPLASTY  12/23/2011  ? Procedure: TOTAL KNEE ARTHROPLASTY;  Surgeon: Lorn Junes, MD;  Location: [place];  Service: Orthopedics;  Laterality: Right;  DR [place] THIS CASE  ?? TRIGGER FINGER RELEASE Right 07/21/2007  ? thumb  ?? TRIGGER FINGER RELEASE Left 09/24/2007  ? middle finger  ?? TRIGGER FINGER RELEASE Right 03/10/2008  ? ring and little fingers  ?? TRIGGER FINGER RELEASE Right 09/02/2012  ? Procedure: RELEASE TRIGGER FINGER/A-1 PULLEY RIGHT INDEX FINGER;  Surgeon: Wynonia Sours, MD;  Location: [place];  Service: Orthopedics;  Laterality: Right;  ?? TRIGGER FINGER RELEASE Left 12/22/2012  ? Procedure: RELEASE TRIGGER FINGER/A-1 PULLEY LEFT RING FINGER;  Surgeon: Wynonia Sours, MD;  Location: [place];  Service: Orthopedics;  Laterality: Left;  Left ?  ? ? ?Allergies  ?Allergen Reactions  ?? Morphine And Related Other (See Comments)  ?  AGITATION, STRANGE DREAMS  ?? Scopolamine Other (See Comments)  ?  MENTAL CHANGES  ?? Atorvastatin Other (See Comments)  ?  unknown  ?? Morphine Other (See Comments)  ?  Disoriented, mood changes  ?? Nsaids Other (See Comments)  ?  unknown  ?? Pravachol [Pravastatin Sodium] Other (See Comments)  ?  unknown  ?? Pravastatin Other (See Comments)  ?  unknown  ?? Teriparatide Other (See Comments)  ?  unknown  ? ? ?Outpatient Encounter Medications as of 10/15/2021  ?Medication Sig  ?? acetaminophen (TYLENOL) 500 MG tablet Take 500 mg by mouth at bedtime.  ?? aspirin EC 81 MG tablet Take 81 mg by mouth daily.   ??  cholecalciferol (VITAMIN D3) 25 MCG (1000 UNIT) tablet Take 1,000 Units by mouth daily.   ?? fluticasone furoate-vilanterol (BREO ELLIPTA) 100-25 MCG/INH AEPB Inhale 1 puff into the lungs daily.  ?? Glycerin-Hypromellose-PEG 400 (ARTIFICIAL TEARS) 0.2-0.2-1 % SOLN Place 1 drop into both eyes 4 (four) times daily.  ?? magnesium hydroxide (MILK OF MAGNESIA) 400 MG/5ML suspension Take 15 mLs by mouth at bedtime as needed for mild constipation.   ?? memantine (NAMENDA) 10 MG tablet Take 10 mg by mouth 2 (two) times daily.   ?? metoprolol tartrate (LOPRESSOR) 25 MG tablet Take 25 mg by mouth  2 (two) times daily.  ?? mirtazapine (REMERON) 7.5 MG tablet Take 7.5 mg by mouth at bedtime.  ?? nitroGLYCERIN (NITROSTAT) 0.4 MG SL tablet Place 0.4 mg under the tongue every 5 (five) minutes as needed for chest pain.  ?? omeprazole (PRILOSEC) 10 MG capsule Take 10 mg by mouth daily.  ?? White Petrolatum-[place] Oil (SYSTANE NIGHTTIME) OINT Apply to both eyes at bedtime  ? ?No facility-administered encounter medications on file as of 10/15/2021.  ? ?ROS was provided with assistance of staff.  ?Review of Systems  ?Constitutional:  Negative for activity change, fever and unexpected weight change.  ?HENT:  Positive for hearing loss. Negative for congestion and trouble swallowing.   ?Eyes:  Negative for visual disturbance.  ?Respiratory:  Negative for cough.   ?Cardiovascular:  Negative for leg swelling.  ?Gastrointestinal:  Negative for abdominal pain and constipation.  ?Genitourinary:  Negative for dysuria and urgency.  ?Musculoskeletal:  Positive for arthralgias, back pain and gait problem.  ?Skin:  Positive for wound.  ?Neurological:  Negative for speech difficulty, weakness and light-headedness.  ?     Dementia  ?Psychiatric/Behavioral:  Positive for confusion. Negative for behavioral problems and sleep disturbance. The patient is not nervous/anxious.   ? ?Immunization History  ?Administered Date(s) Administered  ?? Influenza Whole  03/27/2018  ?? Influenza, High Dose Seasonal PF 03/26/2019  ?? Influenza-Unspecified 04/14/2017, 04/05/2020, 04/11/2021  ?? Moderna SARS-COV2 Booster Vaccination 11/21/2020  ?? Moderna Sars-Covid-2 Vaccinatio

## 2021-10-16 ENCOUNTER — Encounter: Payer: Self-pay | Admitting: Nurse Practitioner

## 2021-10-16 NOTE — Assessment & Plan Note (Signed)
Her memory is preserved on Mam[place]. No behavioral issues, self propels w/c to get around. TSH 2.65 09/21/20 ?

## 2021-10-16 NOTE — Assessment & Plan Note (Signed)
takes Breo Ellipta qd ?

## 2021-10-16 NOTE — Assessment & Plan Note (Signed)
Blood pressure is controlled, continue Metoprolol. 

## 2021-10-16 NOTE — Assessment & Plan Note (Signed)
Heart rate is in control, continue Metoprolol  

## 2021-10-16 NOTE — Assessment & Plan Note (Signed)
pain is controlled, on Tylenol, uses w/c for mobility  ?

## 2021-10-16 NOTE — Assessment & Plan Note (Signed)
Na 134 06/08/21 ?

## 2021-10-16 NOTE — Assessment & Plan Note (Signed)
stable, on Omeprazole, Hgb 11.3 06/08/21 ?

## 2021-10-29 ENCOUNTER — Non-Acute Institutional Stay (SKILLED_NURSING_FACILITY): Payer: Medicare Other | Admitting: Adult Health

## 2021-10-29 ENCOUNTER — Encounter: Payer: Self-pay | Admitting: Adult Health

## 2021-10-29 DIAGNOSIS — R630 Anorexia: Secondary | ICD-10-CM | POA: Diagnosis not present

## 2021-10-29 NOTE — Progress Notes (Signed)
? ?Location:  [facility] ?Nursing Home Room Number: 42-B ?Place of Service:  SNF (31) ?Provider:  Durenda Age, DNP, FNP-BC ? ?Patient Care Team: ?[name], MD as PCP - General (Internal Medicine) ?[name], MD as Referring Physician (Ophthalmology) ?Mast, Man X, NP as Nurse Practitioner (Internal Medicine) ? ?Extended Emergency Contact Information ?Primary Emergency Contact: Oo,James ?Address: [address] ?         [place], [place] [address] [place] ?Home Phone: [phone] ?Mobile Phone: [phone] ?Relation: Son ?Secondary Emergency Contact: Pacholski,Robert ?Address: [address] ?         [place], [place] [address] [place] ?Home Phone: [phone] ?Mobile Phone: [phone] ?Relation: Son ? ?Code Status:  DNR ? ?Goals of care: Advanced Directive information ? ?  10/29/2021  ?  4:11 PM  ?Advanced Directives  ?Does Patient Have a Medical Advance Directive? Yes  ?Type of Paramedic of [place];Out of facility DNR (pink MOST or yellow form)  ?Does patient want to make changes to medical advance directive? No - Patient declined  ?Copy of [place] in Chart? Yes - validated most recent copy scanned in chart (See row information)  ?Pre-existing out of facility DNR order (yellow form or pink MOST form) Pink MOST/Yellow Form most recent copy in chart - Physician notified to receive inpatient order  ? ? ? ?Chief Complaint  ?Patient presents with  ? Acute Visit  ?  Poor appetite   ? ? ?HPI:  ?Pt is a 88 y.o. female seen today for an acute visit regarding poor appetite. She was seen in her room today. She was noted to be holding a bar of chocolate which she took from her drawer. She is alert to herself. She denies pain. Staff reported that she consumes less than 25% of her breakfast and lunch. Review of weights showed that she had 1.3 lb weight loss compared to last month but weighs 108.9 lbs last 3/23  and this month's weight is 108.5 lbs, which is almost same.  ? ? ?Past Medical History:  ?Diagnosis Date  ? Anemia, iron deficiency   ? takes Ferrous Sulfate daily  ? Depression   ? takes Effexor daily  ? Gait disorder 04/21/2014  ? GERD (gastroesophageal reflux disease)   ? takes Omeprazole daily  ? Herpes ocular   ? history of-takes Acyclovir daily  ? High cholesterol   ? takes Atorvastatin daily  ? History of hiatal hernia   ? Hypertension   ? takes Metoprolol daily  ? Insomnia   ? takes Xanax nightly  ? Osteoarthritis of knee   ? bilateral  ? Sciatic pain   ? ?Past Surgical History:  ?Procedure Laterality Date  ? ABDOMINAL HYSTERECTOMY  1995  ? partial  ? CARPAL TUNNEL RELEASE Right 07/21/2007  ? CARPAL TUNNEL RELEASE Left 09/24/2007  ? COLONOSCOPY    ? DESCEMETS STRIPPING AUTOMATED ENDOTHELIAL KERATOPLASTY Left 03/14/2011  ? DESCEMETS STRIPPING AUTOMATED ENDOTHELIAL KERATOPLASTY Right 08/20/2012  ? EYE SURGERY Bilateral   ? cataract surgery  ? EYE SURGERY Left   ? corneal transplant  ? HERNIA REPAIR    ? HIP ARTHROPLASTY Left 06/13/2013  ? Procedure: ARTHROPLASTY BIPOLAR HIP; Injection left shoulder;  Surgeon: Johnny Bridge, MD;  Location: [facility];  Service: Orthopedics;  Laterality: Left;  ? JOINT REPLACEMENT    ? bilateral knees, left knee  ? KNEE ARTHROSCOPY Right 05/11/2001  ? KYPHOPLASTY N/A 04/10/2015  ? Procedure: T11  Kyphoplasty;  Surgeon: Jovita Gamma, MD;  Location: [facility];  Service: Neurosurgery;  Laterality: N/A;  T11 Kyphoplasty  ? KYPHOPLASTY N/A 05/06/2015  ? Procedure: KYPHOPLASTY Thoracic twelve;  Surgeon: Jovita Gamma, MD;  Location: [facility];  Service: Neurosurgery;  Laterality: N/A;  T12 Kyphoplasty  ? LAPAROSCOPIC NISSEN FUNDOPLICATION  6/60/6301  ? LUMBAR LAMINECTOMY/DECOMPRESSION MICRODISCECTOMY  08/29/2010  ? L2-S1  ? SHOULDER ARTHROSCOPY WITH ROTATOR CUFF REPAIR AND SUBACROMIAL DECOMPRESSION Right 01/01/2000  ? TONSILLECTOMY    ? as child  ? TOTAL HIP REVISION Left 07/08/2014  ?  Procedure: TOTAL HIP REVISION;  Surgeon: Kerin Salen, MD;  Location: [facility];  Service: Orthopedics;  Laterality: Left;  ? TOTAL KNEE ARTHROPLASTY Left 05/14/2004  ? TOTAL KNEE ARTHROPLASTY  12/23/2011  ? Procedure: TOTAL KNEE ARTHROPLASTY;  Surgeon: Lorn Junes, MD;  Location: [facility];  Service: Orthopedics;  Laterality: Right;  DR Orlando THIS CASE  ? TRIGGER FINGER RELEASE Right 07/21/2007  ? thumb  ? TRIGGER FINGER RELEASE Left 09/24/2007  ? middle finger  ? TRIGGER FINGER RELEASE Right 03/10/2008  ? ring and little fingers  ? TRIGGER FINGER RELEASE Right 09/02/2012  ? Procedure: RELEASE TRIGGER FINGER/A-1 PULLEY RIGHT INDEX FINGER;  Surgeon: Wynonia Sours, MD;  Location: [facility];  Service: Orthopedics;  Laterality: Right;  ? TRIGGER FINGER RELEASE Left 12/22/2012  ? Procedure: RELEASE TRIGGER FINGER/A-1 PULLEY LEFT RING FINGER;  Surgeon: Wynonia Sours, MD;  Location: [facility];  Service: Orthopedics;  Laterality: Left;  Left ?  ? ? ?Allergies  ?Allergen Reactions  ? Morphine And Related Other (See Comments)  ?  AGITATION, STRANGE DREAMS  ? Scopolamine Other (See Comments)  ?  MENTAL CHANGES  ? Atorvastatin Other (See Comments)  ?  unknown  ? Morphine Other (See Comments)  ?  Disoriented, mood changes  ? Nsaids Other (See Comments)  ?  unknown  ? Pravachol [Pravastatin Sodium] Other (See Comments)  ?  unknown  ? Pravastatin Other (See Comments)  ?  unknown  ? Teriparatide Other (See Comments)  ?  unknown  ? ? ?Outpatient Encounter Medications as of 10/29/2021  ?Medication Sig  ? acetaminophen (TYLENOL) 500 MG tablet Take 500 mg by mouth at bedtime.  ? aspirin EC 81 MG tablet Take 81 mg by mouth daily.   ? carbamide peroxide (DEBROX) 6.5 % OTIC solution 5 drops at bedtime.  ? cholecalciferol (VITAMIN D3) 25 MCG (1000 UNIT) tablet Take 1,000 Units by mouth daily.   ? Glycerin-Hypromellose-PEG 400 (ARTIFICIAL TEARS) 0.2-0.2-1 % SOLN Place 1 drop into both eyes 4 (four)  times daily.  ? magnesium hydroxide (MILK OF MAGNESIA) 400 MG/5ML suspension Take 15 mLs by mouth at bedtime as needed for mild constipation.   ? memantine (NAMENDA) 10 MG tablet Take 10 mg by mouth 2 (two) times daily.   ? metoprolol tartrate (LOPRESSOR) 25 MG tablet Take 25 mg by mouth 2 (two) times daily.  ? mirtazapine (REMERON) 7.5 MG tablet Take 7.5 mg by mouth at bedtime.  ? nitroGLYCERIN (NITROSTAT) 0.4 MG SL tablet Place 0.4 mg under the tongue every 5 (five) minutes as needed for chest pain.  ? omeprazole (PRILOSEC) 10 MG capsule Take 10 mg by mouth daily.  ? White Petrolatum-Mineral Oil (SYSTANE NIGHTTIME) OINT Apply to both eyes at bedtime  ? [DISCONTINUED] fluticasone furoate-vilanterol (BREO ELLIPTA) 100-25 MCG/INH AEPB Inhale 1 puff into the lungs daily.  ? ?No facility-administered encounter  medications on file as of 10/29/2021.  ? ? ?Review of Systems  ?Constitutional:  Positive for appetite change. Negative for chills, fatigue and fever.  ?HENT:  Negative for congestion, hearing loss, rhinorrhea and sore throat.   ?     Hard of hearing  ?Eyes: Negative.   ?Respiratory:  Negative for cough, shortness of breath and wheezing.   ?Cardiovascular:  Negative for chest pain, palpitations and leg swelling.  ?Gastrointestinal:  Negative for abdominal pain, constipation, diarrhea, nausea and vomiting.  ?Genitourinary:  Negative for dysuria.  ?Musculoskeletal:  Negative for arthralgias, back pain and myalgias.  ?Skin:  Negative for color change, rash and wound.  ?Neurological:  Negative for dizziness, weakness and headaches.  ?Psychiatric/Behavioral:  Negative for behavioral problems. The patient is not nervous/anxious.    ? ? ? ?Immunization History  ?Administered Date(s) Administered  ? Influenza Whole 03/27/2018  ? Influenza, High Dose Seasonal PF 03/26/2019  ? Influenza-Unspecified 04/14/2017, 04/05/2020, 04/11/2021  ? Moderna SARS-COV2 Booster Vaccination 11/21/2020  ? Moderna Sars-Covid-2 Vaccination  07/24/2019, 08/21/2019, 05/02/2020  ? Pension scheme manager 80yr & up 03/13/2021  ? Pneumococcal Conjugate-13 09/24/2017  ? Pneumococcal Polysaccharide-23 06/14/2013  ? Td 09/25/2017  ? VJuliann Pulse

## 2021-10-30 LAB — CBC AND DIFFERENTIAL
HCT: 34 — AB (ref 36–46)
Hemoglobin: 11.3 — AB (ref 12.0–16.0)
Neutrophils Absolute: 1208
Platelets: 229 10*3/uL (ref 150–400)
WBC: 5.7

## 2021-10-30 LAB — BASIC METABOLIC PANEL
BUN: 13 (ref 4–21)
CO2: 24 — AB (ref 13–22)
Chloride: 101 (ref 99–108)
Creatinine: 0.8 (ref 0.5–1.1)
Glucose: 87
Potassium: 4.2 mEq/L (ref 3.5–5.1)
Sodium: 134 — AB (ref 137–147)

## 2021-10-30 LAB — COMPREHENSIVE METABOLIC PANEL: Calcium: 8.5 — AB (ref 8.7–10.7)

## 2021-10-30 LAB — CBC: RBC: 3.65 — AB (ref 3.87–5.11)

## 2021-11-09 ENCOUNTER — Non-Acute Institutional Stay (SKILLED_NURSING_FACILITY): Payer: Medicare Other | Admitting: Orthopedic Surgery

## 2021-11-09 ENCOUNTER — Encounter: Payer: Self-pay | Admitting: Orthopedic Surgery

## 2021-11-09 DIAGNOSIS — F01C18 Vascular dementia, severe, with other behavioral disturbance: Secondary | ICD-10-CM | POA: Diagnosis not present

## 2021-11-09 DIAGNOSIS — I10 Essential (primary) hypertension: Secondary | ICD-10-CM

## 2021-11-09 DIAGNOSIS — S61512A Laceration without foreign body of left wrist, initial encounter: Secondary | ICD-10-CM | POA: Diagnosis not present

## 2021-11-09 DIAGNOSIS — J410 Simple chronic bronchitis: Secondary | ICD-10-CM

## 2021-11-09 DIAGNOSIS — E871 Hypo-osmolality and hyponatremia: Secondary | ICD-10-CM

## 2021-11-09 DIAGNOSIS — K219 Gastro-esophageal reflux disease without esophagitis: Secondary | ICD-10-CM

## 2021-11-09 DIAGNOSIS — H6123 Impacted cerumen, bilateral: Secondary | ICD-10-CM

## 2021-11-09 DIAGNOSIS — M159 Polyosteoarthritis, unspecified: Secondary | ICD-10-CM

## 2021-11-09 DIAGNOSIS — R630 Anorexia: Secondary | ICD-10-CM | POA: Diagnosis not present

## 2021-11-09 NOTE — Progress Notes (Signed)
Location:  [facility] Room Number: N042/B Place of Service:  SNF [phone]) Provider: Yvonna Alanis, NP   Patient Care Team: [name], MD as PCP - General (Internal Medicine) [name], MD as Referring Physician (Ophthalmology) Mast, Man X, NP as Nurse Practitioner (Internal Medicine)  Extended Emergency Contact Information Primary Emergency Contact: [name] Address: [address]          [place], [place] [address] [name] of [place] Phone: [phone] Mobile Phone: [phone] Relation: Son Secondary Emergency Contact: [name] Address: [address]          [place], [place] [address] [name] of [place] Phone: [phone] Mobile Phone: [phone] Relation: Son  Code Status:  DNR Goals of care: Advanced Directive information    11/09/2021    1:48 PM  Advanced Directives  Does Patient Have a Medical Advance Directive? Yes  Type of Paramedic of [place];Out of facility DNR (pink MOST or yellow form)  Does patient want to make changes to medical advance directive? No - Patient declined  Copy of [place] in Chart? Yes - validated most recent copy scanned in chart (See row information)  Pre-existing out of facility DNR order (yellow form or pink MOST form) Pink MOST form placed in chart (order not valid for inpatient use)     Chief Complaint  Patient presents with   Medical Management of Chronic Issues    Routine visit.    Quality Metric Gaps    Discuss the need for Shingrix vaccine or post pone if patient refuses.     HPI:  Pt is a 88 y.o. female seen today for medical management of chronic diseases.    She currently resides on the skilled nursing unit at [facility]. PMH: HTN, HLD, PVC's, dysphagia, GERD, constipation, vascular dementia, OA, depression and abnormal gait.   Vascular dementia- increased agitation/combative/ exit seeking behavior  05/18, ambulates with wheelchair, remains on Namenda Fall with injury- 05/18 skin tear to left wrist/hand Poor appetite- see weight trends below, remains on Remeron HTN- BUN/creat 13/0.8 10/30/2021, remains on metoprolol COPD/chronic bronchitis- not on oxygen, off Breo Hyponatremia- Na+ 134 (05/09)> 134 (12/16) GERD- hgb 11.3 10/30/2021, remains on omeprazole OA- involves multiple joints, remains on tylenol     Past Medical History:  Diagnosis Date   Anemia, iron deficiency    takes Ferrous Sulfate daily   Depression    takes Effexor daily   Gait disorder 04/21/2014   GERD (gastroesophageal reflux disease)    takes Omeprazole daily   Herpes ocular    history of-takes Acyclovir daily   High cholesterol    takes Atorvastatin daily   History of hiatal hernia    Hypertension    takes Metoprolol daily   Insomnia    takes Xanax nightly   Osteoarthritis of knee    bilateral   Sciatic pain    Past Surgical History:  Procedure Laterality Date   ABDOMINAL HYSTERECTOMY  1995   partial   CARPAL TUNNEL RELEASE Right 07/21/2007   CARPAL TUNNEL RELEASE Left 09/24/2007   COLONOSCOPY     DESCEMETS STRIPPING AUTOMATED ENDOTHELIAL KERATOPLASTY Left 03/14/2011   DESCEMETS STRIPPING AUTOMATED ENDOTHELIAL KERATOPLASTY Right 08/20/2012   EYE SURGERY Bilateral    cataract surgery   EYE SURGERY Left    corneal transplant   HERNIA REPAIR     HIP ARTHROPLASTY Left 06/13/2013   Procedure: ARTHROPLASTY BIPOLAR HIP; Injection left shoulder;  Surgeon: Vonna Kotyk  Llana Aliment, MD;  Location: [facility];  Service: Orthopedics;  Laterality: Left;   JOINT REPLACEMENT     bilateral knees, left knee   KNEE ARTHROSCOPY Right 05/11/2001   KYPHOPLASTY N/A 04/10/2015   Procedure: T11 Kyphoplasty;  Surgeon: Jovita Gamma, MD;  Location: [facility];  Service: Neurosurgery;  Laterality: N/A;  T11 Kyphoplasty   KYPHOPLASTY N/A 05/06/2015   Procedure: KYPHOPLASTY Thoracic twelve;  Surgeon: Jovita Gamma, MD;  Location:  [facility];  Service: Neurosurgery;  Laterality: N/A;  T12 Kyphoplasty   LAPAROSCOPIC NISSEN FUNDOPLICATION  1/51/7616   LUMBAR LAMINECTOMY/DECOMPRESSION MICRODISCECTOMY  08/29/2010   L2-S1   SHOULDER ARTHROSCOPY WITH ROTATOR CUFF REPAIR AND SUBACROMIAL DECOMPRESSION Right 01/01/2000   TONSILLECTOMY     as child   TOTAL HIP REVISION Left 07/08/2014   Procedure: TOTAL HIP REVISION;  Surgeon: Kerin Salen, MD;  Location: [facility];  Service: Orthopedics;  Laterality: Left;   TOTAL KNEE ARTHROPLASTY Left 05/14/2004   TOTAL KNEE ARTHROPLASTY  12/23/2011   Procedure: TOTAL KNEE ARTHROPLASTY;  Surgeon: Lorn Junes, MD;  Location: [facility];  Service: Orthopedics;  Laterality: Right;  DR Noemi Chapel WANTS 90 MINUTES FOR THIS CASE   TRIGGER FINGER RELEASE Right 07/21/2007   thumb   TRIGGER FINGER RELEASE Left 09/24/2007   middle finger   TRIGGER FINGER RELEASE Right 03/10/2008   ring and little fingers   TRIGGER FINGER RELEASE Right 09/02/2012   Procedure: RELEASE TRIGGER FINGER/A-1 PULLEY RIGHT INDEX FINGER;  Surgeon: Wynonia Sours, MD;  Location: [facility];  Service: Orthopedics;  Laterality: Right;   TRIGGER FINGER RELEASE Left 12/22/2012   Procedure: RELEASE TRIGGER FINGER/A-1 PULLEY LEFT RING FINGER;  Surgeon: Wynonia Sours, MD;  Location: [facility];  Service: Orthopedics;  Laterality: Left;  Left     Allergies  Allergen Reactions   Morphine And Related Other (See Comments)    AGITATION, STRANGE DREAMS   Scopolamine Other (See Comments)    MENTAL CHANGES   Atorvastatin Other (See Comments)    unknown   Morphine Other (See Comments)    Disoriented, mood changes   Nsaids Other (See Comments)    unknown   Pravachol [Pravastatin Sodium] Other (See Comments)    unknown   Pravastatin Other (See Comments)    unknown   Teriparatide Other (See Comments)    unknown    Outpatient Encounter Medications as of 11/09/2021  Medication Sig   acetaminophen (TYLENOL) 500 MG tablet  Take 500 mg by mouth at bedtime.   aspirin EC 81 MG tablet Take 81 mg by mouth daily.    carbamide peroxide (DEBROX) 6.5 % OTIC solution 5 drops at bedtime.   cholecalciferol (VITAMIN D3) 25 MCG (1000 UNIT) tablet Take 1,000 Units by mouth daily.    Glycerin-Hypromellose-PEG 400 (ARTIFICIAL TEARS) 0.2-0.2-1 % SOLN Place 1 drop into both eyes 4 (four) times daily.   magnesium hydroxide (MILK OF MAGNESIA) 400 MG/5ML suspension Take 15 mLs by mouth at bedtime as needed for mild constipation.    memantine (NAMENDA) 10 MG tablet Take 10 mg by mouth 2 (two) times daily.    metoprolol tartrate (LOPRESSOR) 25 MG tablet Take 25 mg by mouth 2 (two) times daily.   mirtazapine (REMERON) 7.5 MG tablet Take 7.5 mg by mouth at bedtime.   nitroGLYCERIN (NITROSTAT) 0.4 MG SL tablet Place 0.4 mg under the tongue every 5 (five) minutes as needed for chest pain.   omeprazole (PRILOSEC) 10 MG capsule Take 10  mg by mouth daily.   White Petrolatum-Mineral Oil (SYSTANE NIGHTTIME) OINT Apply to both eyes at bedtime   No facility-administered encounter medications on file as of 11/09/2021.    Review of Systems  Unable to perform ROS: Dementia   Immunization History  Administered Date(s) Administered   Influenza Whole 03/27/2018   Influenza, High Dose Seasonal PF 03/26/2019   Influenza-Unspecified 04/14/2017, 04/05/2020, 04/11/2021   Moderna SARS-COV2 Booster Vaccination 11/21/2020   Moderna Sars-Covid-2 Vaccination 07/24/2019, 08/21/2019, 05/02/2020   Pfizer Covid-19 Vaccine Bivalent Booster 66yr & up 03/13/2021   Pneumococcal Conjugate-13 09/24/2017   Pneumococcal Polysaccharide-23 06/14/2013   Td 09/25/2017   Varicella 06/29/2003   Pertinent  Health Maintenance Due  Topic Date Due   INFLUENZA VACCINE  01/22/2022   DEXA SCAN  Completed      09/19/2017    1:53 PM  Fall Risk  Falls in the past year? No   Functional Status Survey:    Vitals:   11/09/21 1345  BP: (!) 143/71  Pulse: 60  Resp: 20   Temp: (!) 97.5 F (36.4 C)  SpO2: 96%  Weight: 108 lb 8 oz (49.2 kg)  Height: '5\' 1"'$  (1.549 m)   Body mass index is 20.5 kg/m. Physical Exam Vitals reviewed.  Constitutional:      General: She is not in acute distress.    Comments: Tan complexion  HENT:     Head: Normocephalic.     Right Ear: There is impacted cerumen.     Left Ear: There is impacted cerumen.     Ears:     Comments: HOH    Nose: Nose normal.     Mouth/Throat:     Mouth: Mucous membranes are moist.  Eyes:     General:        Right eye: No discharge.        Left eye: No discharge.  Neck:     Vascular: No carotid bruit.  Cardiovascular:     Rate and Rhythm: Normal rate and regular rhythm.     Pulses: Normal pulses.     Heart sounds: Normal heart sounds.  Pulmonary:     Effort: Pulmonary effort is normal. No respiratory distress.     Breath sounds: Normal breath sounds. No wheezing.  Abdominal:     General: Bowel sounds are normal. There is no distension.     Palpations: Abdomen is soft.     Tenderness: There is no abdominal tenderness. There is no guarding.  Musculoskeletal:     Cervical back: Neck supple.     Right lower leg: No edema.     Left lower leg: No edema.  Lymphadenopathy:     Cervical: No cervical adenopathy.  Skin:    General: Skin is warm and dry.     Capillary Refill: Capillary refill takes less than 2 seconds.     Comments: 3 cm x 1 cm skin tear to anterior left wrist, CDI, serosanguinous drainage, surrounding skin intact  Neurological:     General: No focal deficit present.     Mental Status: She is alert. Mental status is at baseline.     Motor: Weakness present.     Gait: Gait abnormal.     Comments: wheelchair  Psychiatric:        Mood and Affect: Mood normal.        Behavior: Behavior normal.     Comments: Very pleasant, HOH, follows commands, alert to self/place    Labs reviewed: Recent Labs  06/08/21 0000 10/30/21 0000  NA 134* 134*  K 4.5 4.2  CL 100 101   CO2 27* 24*  BUN 12 13  CREATININE 0.6 0.8  CALCIUM 8.6* 8.5*   Recent Labs    06/08/21 0000  ALBUMIN 3.2*   Recent Labs    06/08/21 0000 10/30/21 0000  WBC 5.4 5.7  NEUTROABS  --  1,208.00  HGB 11.3* 11.3*  HCT 33* 34*  PLT 220 229   Lab Results  Component Value Date   TSH 2.65 09/14/2020   Lab Results  Component Value Date   HGBA1C 5.8 06/29/2013   No results found for: CHOL, HDL, LDLCALC, LDLDIRECT, TRIG, CHOLHDL  Significant Diagnostic Results in last 30 days:  No results found.  Assessment/Plan 1. Severe vascular dementia with other behavioral disturbance ([place]) - 05/18 noted to be agitated/exit seeking - pleasant today - ambulates in wheelchair - cont skilled nursing care - cont Namenda  2. Tear of skin of left wrist, initial encounter - no sign of infection - cont dressing changes prn  3. Poor appetite - on Remeron - weights stable - encouraged to eat snacks  4. Primary hypertension - controlled  - cont metoprolol  5. Simple chronic bronchitis ([place]) - off Breo  6. Hyponatremia - Na + 134 (05/09)> 134 (05/2021)  7. Gastroesophageal reflux disease, unspecified whether esophagitis present - hgb stable - cont omeprazole  8. Osteoarthritis of multiple joints, unspecified osteoarthritis type - cont tylenol   9. Bilateral impacted cerumen - HOH - cannot visualize TM - Debrox started by nursing 05/19    Family/ staff Communication: plan discussed with patient and nurse  Labs/tests ordered:  none

## 2021-11-13 ENCOUNTER — Encounter: Payer: Self-pay | Admitting: Internal Medicine

## 2021-11-13 ENCOUNTER — Non-Acute Institutional Stay (SKILLED_NURSING_FACILITY): Payer: Medicare Other | Admitting: Internal Medicine

## 2021-11-13 DIAGNOSIS — K219 Gastro-esophageal reflux disease without esophagitis: Secondary | ICD-10-CM | POA: Diagnosis not present

## 2021-11-13 DIAGNOSIS — I10 Essential (primary) hypertension: Secondary | ICD-10-CM

## 2021-11-13 DIAGNOSIS — F339 Major depressive disorder, recurrent, unspecified: Secondary | ICD-10-CM

## 2021-11-13 DIAGNOSIS — F01C18 Vascular dementia, severe, with other behavioral disturbance: Secondary | ICD-10-CM

## 2021-11-13 MED ORDER — LORAZEPAM 0.5 MG PO TABS: 0.5000 mg | ORAL_TABLET | Freq: Every day | ORAL | 0 refills | Status: DC | PRN

## 2021-11-13 NOTE — Progress Notes (Signed)
Location:   [facility] Room Number: 42 Place of Service:  SNF [phone]) Provider:  Veleta Miners MD  [name], MD  Patient Care Team: [name], MD as PCP - General (Internal Medicine) [name], MD as Referring Physician (Ophthalmology) Mast, Man X, NP as Nurse Practitioner (Internal Medicine)  Extended Emergency Contact Information Primary Emergency Contact: [name] Address: [address]          [place],  [address] [name] of [place] Phone: [phone] Mobile Phone: [phone] Relation: Son Secondary Emergency Contact: [name] Address: [address]          [place], [place] [address] [name] of [place] Phone: [phone] Mobile Phone: [phone] Relation: Son  Code Status:  DNR Goals of care: Advanced Directive information    11/13/2021    1:41 PM  Advanced Directives  Does Patient Have a Medical Advance Directive? Yes  Type of Paramedic of [place];Out of facility DNR (pink MOST or yellow form)  Does patient want to make changes to medical advance directive? No - Patient declined  Copy of [place] in Chart? Yes - validated most recent copy scanned in chart (See row information)  Pre-existing out of facility DNR order (yellow form or pink MOST form) Pink MOST form placed in chart (order not valid for inpatient use)     Chief Complaint  Patient presents with   Acute Visit    Agitation    HPI:  Pt is a 88 y.o. female seen today for an acute visit for Behavior issues  Lives in SNF Patient has a history of Hypertension, anemia, depression, Hyperlipidemia , COPD, hyponatremia,Depression with Anxiety and Dementia   Per staff she is getting agitated with them. Refusing care sometimes Exit seeking Was very pleasant with me today Did get her ears cleaned yesterday but still having some issues hearing Labs done few weeks ago were good  levels  Weight is stable NO Fever Cough or Dysuria noticed   Past Medical History:  Diagnosis Date   Anemia, iron deficiency    takes Ferrous Sulfate daily   Depression    takes Effexor daily   Gait disorder 04/21/2014   GERD (gastroesophageal reflux disease)    takes Omeprazole daily   Herpes ocular    history of-takes Acyclovir daily   High cholesterol    takes Atorvastatin daily   History of hiatal hernia    Hypertension    takes Metoprolol daily   Insomnia    takes Xanax nightly   Osteoarthritis of knee    bilateral   Sciatic pain    Past Surgical History:  Procedure Laterality Date   ABDOMINAL HYSTERECTOMY  1995   partial   CARPAL TUNNEL RELEASE Right 07/21/2007   CARPAL TUNNEL RELEASE Left 09/24/2007   COLONOSCOPY     DESCEMETS STRIPPING AUTOMATED ENDOTHELIAL KERATOPLASTY Left 03/14/2011   DESCEMETS STRIPPING AUTOMATED ENDOTHELIAL KERATOPLASTY Right 08/20/2012   EYE SURGERY Bilateral    cataract surgery   EYE SURGERY Left    corneal transplant   HERNIA REPAIR     HIP ARTHROPLASTY Left 06/13/2013   Procedure: ARTHROPLASTY BIPOLAR HIP; Injection left shoulder;  Surgeon: Johnny Bridge, MD;  Location: [facility];  Service: Orthopedics;  Laterality: Left;   JOINT REPLACEMENT     bilateral knees, left knee   KNEE ARTHROSCOPY Right 05/11/2001   KYPHOPLASTY N/A 04/10/2015   Procedure: T11 Kyphoplasty;  Surgeon: Jovita Gamma, MD;  Location:  [facility];  Service: Neurosurgery;  Laterality: N/A;  T11 Kyphoplasty   KYPHOPLASTY N/A 05/06/2015   Procedure: KYPHOPLASTY Thoracic twelve;  Surgeon: Jovita Gamma, MD;  Location: [facility];  Service: Neurosurgery;  Laterality: N/A;  T12 Kyphoplasty   LAPAROSCOPIC NISSEN FUNDOPLICATION  07/21/7865   LUMBAR LAMINECTOMY/DECOMPRESSION MICRODISCECTOMY  08/29/2010   L2-S1   SHOULDER ARTHROSCOPY WITH ROTATOR CUFF REPAIR AND SUBACROMIAL DECOMPRESSION Right 01/01/2000   TONSILLECTOMY     as child   TOTAL HIP REVISION Left 07/08/2014    Procedure: TOTAL HIP REVISION;  Surgeon: Kerin Salen, MD;  Location: [facility];  Service: Orthopedics;  Laterality: Left;   TOTAL KNEE ARTHROPLASTY Left 05/14/2004   TOTAL KNEE ARTHROPLASTY  12/23/2011   Procedure: TOTAL KNEE ARTHROPLASTY;  Surgeon: Lorn Junes, MD;  Location: [facility];  Service: Orthopedics;  Laterality: Right;  DR Noemi Chapel WANTS 90 MINUTES FOR THIS CASE   TRIGGER FINGER RELEASE Right 07/21/2007   thumb   TRIGGER FINGER RELEASE Left 09/24/2007   middle finger   TRIGGER FINGER RELEASE Right 03/10/2008   ring and little fingers   TRIGGER FINGER RELEASE Right 09/02/2012   Procedure: RELEASE TRIGGER FINGER/A-1 PULLEY RIGHT INDEX FINGER;  Surgeon: Wynonia Sours, MD;  Location: [facility];  Service: Orthopedics;  Laterality: Right;   TRIGGER FINGER RELEASE Left 12/22/2012   Procedure: RELEASE TRIGGER FINGER/A-1 PULLEY LEFT RING FINGER;  Surgeon: Wynonia Sours, MD;  Location: [facility];  Service: Orthopedics;  Laterality: Left;  Left     Allergies  Allergen Reactions   Morphine And Related Other (See Comments)    AGITATION, STRANGE DREAMS   Scopolamine Other (See Comments)    MENTAL CHANGES   Atorvastatin Other (See Comments)    unknown   Morphine Other (See Comments)    Disoriented, mood changes   Nsaids Other (See Comments)    unknown   Pravachol [Pravastatin Sodium] Other (See Comments)    unknown   Pravastatin Other (See Comments)    unknown   Teriparatide Other (See Comments)    unknown    Allergies as of 11/13/2021       Reactions   Morphine And Related Other (See Comments)   AGITATION, STRANGE DREAMS   Scopolamine Other (See Comments)   MENTAL CHANGES   Atorvastatin Other (See Comments)   unknown   Morphine Other (See Comments)   Disoriented, mood changes   Nsaids Other (See Comments)   unknown   Pravachol [pravastatin Sodium] Other (See Comments)   unknown   Pravastatin Other (See Comments)   unknown   Teriparatide Other (See  Comments)   unknown        Medication List        Accurate as of Nov 13, 2021  1:41 PM. If you have any questions, ask your nurse or doctor.          acetaminophen 500 MG tablet Commonly known as: TYLENOL Take 500 mg by mouth at bedtime.   Artificial Tears 0.2-0.2-1 % Soln Generic drug: Glycerin-Hypromellose-PEG 400 Place 1 drop into both eyes 4 (four) times daily.   aspirin EC 81 MG tablet Take 81 mg by mouth daily.   carbamide peroxide 6.5 % OTIC solution Commonly known as: DEBROX 5 drops at bedtime.   cholecalciferol 25 MCG (1000 UNIT) tablet Commonly known as: VITAMIN D3 Take 1,000 Units by mouth daily.   magnesium hydroxide 400 MG/5ML suspension Commonly known as: MILK OF MAGNESIA Take 15 mLs by  mouth at bedtime as needed for mild constipation.   memantine 10 MG tablet Commonly known as: NAMENDA Take 10 mg by mouth 2 (two) times daily.   metoprolol tartrate 25 MG tablet Commonly known as: LOPRESSOR Take 25 mg by mouth 2 (two) times daily.   mirtazapine 7.5 MG tablet Commonly known as: REMERON Take 7.5 mg by mouth at bedtime.   nitroGLYCERIN 0.4 MG SL tablet Commonly known as: NITROSTAT Place 0.4 mg under the tongue every 5 (five) minutes as needed for chest pain.   omeprazole 10 MG capsule Commonly known as: PRILOSEC Take 10 mg by mouth daily.   Systane Nighttime Oint Apply to both eyes at bedtime        Review of Systems  Unable to perform ROS: Dementia  Psychiatric/Behavioral:  Positive for behavioral problems and confusion.    Immunization History  Administered Date(s) Administered   Influenza Whole 03/27/2018   Influenza, High Dose Seasonal PF 03/26/2019   Influenza-Unspecified 04/14/2017, 04/05/2020, 04/11/2021   Moderna SARS-COV2 Booster Vaccination 11/21/2020   Moderna Sars-Covid-2 Vaccination 07/24/2019, 08/21/2019, 05/02/2020   Pfizer Covid-19 Vaccine Bivalent Booster 13yr & up 03/13/2021   Pneumococcal Conjugate-13  09/24/2017   Pneumococcal Polysaccharide-23 06/14/2013   Td 09/25/2017   Varicella 06/29/2003   Pertinent  Health Maintenance Due  Topic Date Due   INFLUENZA VACCINE  01/22/2022   DEXA SCAN  Completed      09/19/2017    1:53 PM  Fall Risk  Falls in the past year? No   Functional Status Survey:    Vitals:   11/13/21 1331  BP: (!) 143/71  Pulse: 60  Resp: 20  Temp: 98.2 F (36.8 C)  SpO2: 95%  Weight: 108 lb 8 oz (49.2 kg)  Height: '5\' 1"'$  (1.549 m)   Body mass index is 20.5 kg/m. Physical Exam Vitals reviewed.  Constitutional:      Appearance: Normal appearance.  HENT:     Head: Normocephalic.     Nose: Nose normal.     Mouth/Throat:     Mouth: Mucous membranes are moist.     Pharynx: Oropharynx is clear.  Eyes:     Pupils: Pupils are equal, round, and reactive to light.  Cardiovascular:     Rate and Rhythm: Normal rate and regular rhythm.     Pulses: Normal pulses.     Heart sounds: Normal heart sounds. No murmur heard. Pulmonary:     Effort: Pulmonary effort is normal.     Comments: Few rales Bilateral Abdominal:     General: Abdomen is flat. Bowel sounds are normal.     Palpations: Abdomen is soft.  Musculoskeletal:        General: No swelling.     Cervical back: Neck supple.  Skin:    General: Skin is warm.  Neurological:     General: No focal deficit present.     Mental Status: She is alert.  Psychiatric:        Mood and Affect: Mood normal.        Thought Content: Thought content normal.    Labs reviewed: Recent Labs    06/08/21 0000 10/30/21 0000  NA 134* 134*  K 4.5 4.2  CL 100 101  CO2 27* 24*  BUN 12 13  CREATININE 0.6 0.8  CALCIUM 8.6* 8.5*   Recent Labs    06/08/21 0000  ALBUMIN 3.2*   Recent Labs    06/08/21 0000 10/30/21 0000  WBC 5.4 5.7  NEUTROABS  --  1,208.00  HGB 11.3* 11.3*  HCT 33* 34*  PLT 220 229   Lab Results  Component Value Date   TSH 2.65 09/14/2020   Lab Results  Component Value Date   HGBA1C  5.8 06/29/2013   No results found for: CHOL, HDL, LDLCALC, LDLDIRECT, TRIG, CHOLHDL  Significant Diagnostic Results in last 30 days:  No results found.  Assessment/Plan 1. Severe vascular dementia with other behavioral disturbance (HCC) Worsening Cognition Weight is stable On Namenda  2. Depression, recurrent ([place]) with Agitation Will restart her Effexor has been on it before to help her mood and see if it helps with her behaviors Also Ativan 0.5 mg QD prn for  agitation Also on Remeron 3. Primary hypertension On Metoprolol  4. Gastroesophageal reflux disease, unspecified whether esophagitis present On Prilosec    Family/ staff Communication:   Labs/tests ordered:

## 2021-12-04 ENCOUNTER — Emergency Department (HOSPITAL_COMMUNITY)
Admission: EM | Admit: 2021-12-04 | Discharge: 2021-12-04 | Disposition: A | Discharge status: Home / Self Care | Payer: Medicare Other | Attending: Student | Admitting: Student

## 2021-12-04 ENCOUNTER — Other Ambulatory Visit: Payer: Self-pay

## 2021-12-04 ENCOUNTER — Emergency Department (HOSPITAL_COMMUNITY): Payer: Medicare Other

## 2021-12-04 ENCOUNTER — Encounter: Payer: Self-pay | Admitting: Internal Medicine

## 2021-12-04 ENCOUNTER — Non-Acute Institutional Stay (SKILLED_NURSING_FACILITY): Payer: Medicare Other | Admitting: Internal Medicine

## 2021-12-04 DIAGNOSIS — W19XXXA Unspecified fall, initial encounter: Secondary | ICD-10-CM

## 2021-12-04 DIAGNOSIS — S42142A Displaced fracture of glenoid cavity of scapula, left shoulder, initial encounter for closed fracture: Secondary | ICD-10-CM | POA: Diagnosis not present

## 2021-12-04 DIAGNOSIS — Z87891 Personal history of nicotine dependence: Secondary | ICD-10-CM | POA: Insufficient documentation

## 2021-12-04 DIAGNOSIS — F0150 Vascular dementia without behavioral disturbance: Secondary | ICD-10-CM | POA: Insufficient documentation

## 2021-12-04 DIAGNOSIS — I10 Essential (primary) hypertension: Secondary | ICD-10-CM | POA: Diagnosis not present

## 2021-12-04 DIAGNOSIS — S42152A Displaced fracture of neck of scapula, left shoulder, initial encounter for closed fracture: Secondary | ICD-10-CM | POA: Diagnosis not present

## 2021-12-04 DIAGNOSIS — R400 Somnolence: Secondary | ICD-10-CM | POA: Diagnosis not present

## 2021-12-04 DIAGNOSIS — W19XXXD Unspecified fall, subsequent encounter: Secondary | ICD-10-CM

## 2021-12-04 DIAGNOSIS — S0990XA Unspecified injury of head, initial encounter: Secondary | ICD-10-CM | POA: Diagnosis present

## 2021-12-04 DIAGNOSIS — S0181XA Laceration without foreign body of other part of head, initial encounter: Secondary | ICD-10-CM | POA: Insufficient documentation

## 2021-12-04 DIAGNOSIS — I609 Nontraumatic subarachnoid hemorrhage, unspecified: Secondary | ICD-10-CM | POA: Diagnosis not present

## 2021-12-04 DIAGNOSIS — R509 Fever, unspecified: Secondary | ICD-10-CM | POA: Diagnosis not present

## 2021-12-04 DIAGNOSIS — Z79899 Other long term (current) drug therapy: Secondary | ICD-10-CM | POA: Insufficient documentation

## 2021-12-04 DIAGNOSIS — S066X0A Traumatic subarachnoid hemorrhage without loss of consciousness, initial encounter: Secondary | ICD-10-CM | POA: Diagnosis not present

## 2021-12-04 DIAGNOSIS — Z7982 Long term (current) use of aspirin: Secondary | ICD-10-CM | POA: Diagnosis not present

## 2021-12-04 DIAGNOSIS — Z96659 Presence of unspecified artificial knee joint: Secondary | ICD-10-CM | POA: Insufficient documentation

## 2021-12-04 DIAGNOSIS — F01C18 Vascular dementia, severe, with other behavioral disturbance: Secondary | ICD-10-CM

## 2021-12-04 LAB — CBC WITH DIFFERENTIAL/PLATELET
Abs Immature Granulocytes: 0.06 10*3/uL (ref 0.00–0.07)
Basophils Absolute: 0 10*3/uL (ref 0.0–0.1)
Basophils Relative: 1 %
Eosinophils Absolute: 0.1 10*3/uL (ref 0.0–0.5)
Eosinophils Relative: 1 %
HCT: 37.9 % (ref 36.0–46.0)
Hemoglobin: 12.9 g/dL (ref 12.0–15.0)
Immature Granulocytes: 1 %
Lymphocytes Relative: 21 %
Lymphs Abs: 1.3 10*3/uL (ref 0.7–4.0)
MCH: 31.6 pg (ref 26.0–34.0)
MCHC: 34 g/dL (ref 30.0–36.0)
MCV: 92.9 fL (ref 80.0–100.0)
Monocytes Absolute: 0.4 10*3/uL (ref 0.1–1.0)
Monocytes Relative: 6 %
Neutro Abs: 4.5 10*3/uL (ref 1.7–7.7)
Neutrophils Relative %: 70 %
Platelets: 218 10*3/uL (ref 150–400)
RBC: 4.08 MIL/uL (ref 3.87–5.11)
RDW: 12.3 % (ref 11.5–15.5)
WBC: 6.4 10*3/uL (ref 4.0–10.5)
nRBC: 0 % (ref 0.0–0.2)

## 2021-12-04 LAB — COMPREHENSIVE METABOLIC PANEL
ALT: 22 U/L (ref 0–44)
AST: 28 U/L (ref 15–41)
Albumin: 3.6 g/dL (ref 3.5–5.0)
Alkaline Phosphatase: 88 U/L (ref 38–126)
Anion gap: 8 (ref 5–15)
BUN: 12 mg/dL (ref 8–23)
CO2: 25 mmol/L (ref 22–32)
Calcium: 9.1 mg/dL (ref 8.9–10.3)
Chloride: 98 mmol/L (ref 98–111)
Creatinine, Ser: 0.75 mg/dL (ref 0.44–1.00)
GFR, Estimated: >60 mL/min (ref >60)
Glucose, Bld: 106 mg/dL — ABNORMAL HIGH (ref 70–99)
Potassium: 3.9 mmol/L (ref 3.5–5.1)
Sodium: 131 mmol/L — ABNORMAL LOW (ref 135–145)
Total Bilirubin: 0.8 mg/dL (ref 0.3–1.2)
Total Protein: 6.7 g/dL (ref 6.5–8.1)

## 2021-12-04 MED ORDER — MORPHINE SULFATE (CONCENTRATE) 20 MG/ML PO SOLN
5.0000 mg | ORAL | 0 refills | Status: DC | PRN
Start: 2021-12-04 — End: 2021-12-12

## 2021-12-04 MED ORDER — LORAZEPAM 0.5 MG PO TABS: 0.5000 mg | ORAL_TABLET | ORAL | 0 refills | Status: DC | PRN

## 2021-12-04 MED ORDER — HYDRALAZINE HCL 20 MG/ML IJ SOLN
10.0000 mg | Freq: Once | INTRAMUSCULAR | Status: DC
  Filled 2021-12-04: qty 1

## 2021-12-04 MED ORDER — ACETAMINOPHEN 650 MG RE SUPP
650.0000 mg | Freq: Once | RECTAL | Status: AC
  Administered 2021-12-04: 650 mg via RECTAL
  Filled 2021-12-04: qty 1

## 2021-12-04 NOTE — ED Triage Notes (Signed)
Pt bib [facility] from [facility]F where she had an unwitnessed fall this am. Pt was checked on by staff at [facility] and they heard a loud noise at 0645. Pt normally does all of her ADLs on her own and transfers to her wheelchair with no assistance. Staff assumed that she was trying to get into her wheelchair and fell forward.  Pt arrives with hematoma on left forehead and bruising to left shoulder. Pt is at baseline on arrival per facility. Pt has DNR and comfort care paperwork at the bedside. EMS vitals: 158/90, 76HR, 95%RA,112CBG

## 2021-12-04 NOTE — ED Notes (Signed)
Pt transported to CT ?

## 2021-12-04 NOTE — ED Notes (Signed)
Got patient into a gown on the monitor got patient some warm blankets

## 2021-12-04 NOTE — ED Notes (Signed)
Head wound cleaned and dressed

## 2021-12-04 NOTE — ED Provider Notes (Signed)
[facility] EMERGENCY DEPARTMENT Provider Note  CSN: [id number] Arrival date & time: 12/04/21 4782  Chief Complaint(s) Fall  HPI Kristin Solomon is a 88 y.o. female with PMH severe vascular dementia, HTN currently on comfort care who presents emergency department for evaluation of unwitnessed fall.  I spoke with the patient's facility who states that she was found on the ground with a left orbital laceration and bruising to the left shoulder and they were unable to contact family given the early hour.  Out of an abundance of caution they sent the patient to the emergency department.  On arrival, patient is disoriented which is her baseline and arrives with bruising to the left shoulder and the after mentioned hematoma is visible over the left orbit.  No additional external evidence of trauma seen.  Additional history unable to be obtained given patient's underlying dementia.   Past Medical History Past Medical History:  Diagnosis Date   Anemia, iron deficiency    takes Ferrous Sulfate daily   Depression    takes Effexor daily   Gait disorder 04/21/2014   GERD (gastroesophageal reflux disease)    takes Omeprazole daily   Herpes ocular    history of-takes Acyclovir daily   High cholesterol    takes Atorvastatin daily   History of hiatal hernia    Hypertension    takes Metoprolol daily   Insomnia    takes Xanax nightly   Osteoarthritis of knee    bilateral   Sciatic pain    Patient Active Problem List   Diagnosis Date Noted   Cellulitis of left leg without foot 05/25/2021   Chalazion left upper eyelid 12/28/2020   Osteoarthritis, multiple sites 09/16/2019   Pressure ulcer of right heel 07/23/2019   Pneumonia due to COVID-19 virus 06/23/2019   Dry skin dermatitis 05/15/2018   Humerus head fracture, left, sequela 12/22/2017   Dry eyes, bilateral 12/04/2017   UTI (urinary tract infection) 10/06/2017   Vascular dementia ([place]) 10/06/2017   Fall  10/02/2017   Nasal bone fracture 10/01/2017   Closed fracture of left olecranon process 09/02/2017   Oropharyngeal dysphagia 09/02/2017   Advance care planning 09/02/2017   Chronic bronchitis ([place]) 09/02/2017   Olecranon fracture 08/28/2017   Metacarpal bone fracture 08/28/2017   Hyponatremia 08/22/2017   T12 compression fracture ([place]) 05/06/2015   Wedge compression fracture of T11 vertebra ([place]) 04/10/2015   GERD (gastroesophageal reflux disease) 07/13/2014   Dry mouth 07/13/2014   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   Gait disorder 04/21/2014   Slow transit constipation 06/21/2013   Depression, recurrent ([place]) 06/21/2013   Diverticulosis of colon without hemorrhage 06/13/2013   S/P Nissen fundoplication (without gastrostomy tube) procedure 06/13/2013   Lactose intolerance 06/13/2013   PVCs (premature ventricular contractions) 06/13/2013   Fracture of femoral neck, left ([place]) 06/12/2013   Closed left hip fracture ([place]) 06/12/2013   Lumbar radiculopathy, chronic 12/23/2011   Hyperlipidemia    Hypertension    Tachycardia    Herpes ocular    Iron deficiency anemia    Right knee DJD    Status post total knee replacement    Home Medication(s) Prior to Admission medications   Medication Sig Start Date End Date Taking? Authorizing Provider  acetaminophen (TYLENOL) 500 MG tablet Take 500 mg by mouth at bedtime.    [provider]  aspirin EC 81 MG tablet Take 81 mg by mouth daily.     [provider]  carbamide peroxide (DEBROX) 6.5 %  OTIC solution 5 drops at bedtime.    [provider]  cholecalciferol (VITAMIN D3) 25 MCG (1000 UNIT) tablet Take 1,000 Units by mouth daily.     [provider]  Glycerin-Hypromellose-PEG 400 (ARTIFICIAL TEARS) 0.2-0.2-1 % SOLN Place 1 drop into both eyes 4 (four) times daily. 02/25/19   [provider]  LORazepam (ATIVAN) 0.5 MG tablet Take 0.5 mg by mouth daily as needed for anxiety.    [provider]  LORazepam (ATIVAN) 0.5 MG tablet Take 1 tablet (0.5 mg total) by mouth daily as needed for anxiety. 11/13/21   [name], MD  magnesium hydroxide (MILK OF MAGNESIA) 400 MG/5ML suspension Take 15 mLs by mouth at bedtime as needed for mild constipation.     [provider]  memantine (NAMENDA) 10 MG tablet Take 10 mg by mouth 2 (two) times daily.  10/29/19   [provider]  metoprolol tartrate (LOPRESSOR) 25 MG tablet Take 25 mg by mouth 2 (two) times daily.    [provider]  mirtazapine (REMERON) 7.5 MG tablet Take 7.5 mg by mouth at bedtime.    [provider]  nitroGLYCERIN (NITROSTAT) 0.4 MG SL tablet Place 0.4 mg under the tongue every 5 (five) minutes as needed for chest pain.    [provider]  omeprazole (PRILOSEC) 10 MG capsule Take 10 mg by mouth daily.    [provider]  venlafaxine XR (EFFEXOR-XR) 75 MG 24 hr capsule Take 75 mg by mouth daily with breakfast.    [provider]  White Petrolatum-Mineral Oil (SYSTANE NIGHTTIME) OINT Apply to both eyes at bedtime    [provider]                                                                                                                                    Past Surgical History Past Surgical History:  Procedure Laterality Date   ABDOMINAL HYSTERECTOMY  1995   partial   CARPAL TUNNEL RELEASE Right 07/21/2007   CARPAL TUNNEL RELEASE Left 09/24/2007   COLONOSCOPY     DESCEMETS STRIPPING AUTOMATED ENDOTHELIAL KERATOPLASTY Left 03/14/2011   DESCEMETS STRIPPING AUTOMATED ENDOTHELIAL KERATOPLASTY Right 08/20/2012   EYE SURGERY Bilateral    cataract surgery   EYE SURGERY Left    corneal transplant   HERNIA REPAIR     HIP ARTHROPLASTY Left 06/13/2013   Procedure: ARTHROPLASTY BIPOLAR HIP; Injection left shoulder;  Surgeon: Johnny Bridge, MD;  Location: [place];  Service: Orthopedics;  Laterality: Left;   JOINT REPLACEMENT     bilateral knees,  left knee   KNEE ARTHROSCOPY Right 05/11/2001   KYPHOPLASTY N/A 04/10/2015   Procedure: T11 Kyphoplasty;  Surgeon: Jovita Gamma, MD;  Location: [place];  Service: Neurosurgery;  Laterality: N/A;  T11 Kyphoplasty   KYPHOPLASTY N/A 05/06/2015   Procedure: KYPHOPLASTY Thoracic twelve;  Surgeon: Jovita Gamma, MD;  Location: [facility];  Service: Neurosurgery;  Laterality: N/A;  T12 Kyphoplasty   LAPAROSCOPIC NISSEN FUNDOPLICATION  7/82/9562   LUMBAR LAMINECTOMY/DECOMPRESSION MICRODISCECTOMY  08/29/2010   L2-S1   SHOULDER ARTHROSCOPY WITH ROTATOR CUFF REPAIR AND SUBACROMIAL DECOMPRESSION Right 01/01/2000   TONSILLECTOMY     as child   TOTAL HIP REVISION Left 07/08/2014   Procedure: TOTAL HIP REVISION;  Surgeon: Kerin Salen, MD;  Location: [place];  Service: Orthopedics;  Laterality: Left;   TOTAL KNEE ARTHROPLASTY Left 05/14/2004   TOTAL KNEE ARTHROPLASTY  12/23/2011   Procedure: TOTAL KNEE ARTHROPLASTY;  Surgeon: Lorn Junes, MD;  Location: [place];  Service: Orthopedics;  Laterality: Right;  DR Noemi Chapel WANTS 90 MINUTES FOR THIS CASE   TRIGGER FINGER RELEASE Right 07/21/2007   thumb   TRIGGER FINGER RELEASE Left 09/24/2007   middle finger   TRIGGER FINGER RELEASE Right 03/10/2008   ring and little fingers   TRIGGER FINGER RELEASE Right 09/02/2012   Procedure: RELEASE TRIGGER FINGER/A-1 PULLEY RIGHT INDEX FINGER;  Surgeon: Wynonia Sours, MD;  Location: [place];  Service: Orthopedics;  Laterality: Right;   TRIGGER FINGER RELEASE Left 12/22/2012   Procedure: RELEASE TRIGGER FINGER/A-1 PULLEY LEFT RING FINGER;  Surgeon: Wynonia Sours, MD;  Location: [place];  Service: Orthopedics;  Laterality: Left;  Left    Family History Family History  Problem Relation Age of Onset   Heart attack Mother    Parkinsonism Father    Diabetes Brother     Social History Social History   Tobacco Use   Smoking status: Former   Smokeless tobacco: Never   Tobacco  comments:    quit smoking 20-30 yr. ago  Vaping Use   Vaping Use: Never used  Substance Use Topics   Alcohol use: No   Drug use: Yes   Allergies Morphine and related, Transderm-scop [scopolamine], Lipitor [atorvastatin], Nsaids, Pravachol [pravastatin sodium], and Teriparatide  Review of Systems Review of Systems  Unable to perform ROS: Dementia    Physical Exam Vital Signs  I have reviewed the triage vital signs BP (!) 141/84   Pulse 88   Temp 97.9 F (36.6 C) (Axillary)   Resp (!) 22   SpO2 99%   Physical Exam Vitals and nursing note reviewed.  Constitutional:      General: She is not in acute distress.    Appearance: She is well-developed.  HENT:     Head: Normocephalic.     Comments: Laceration to left superior orbit/forehead with associated hematoma Eyes:     Conjunctiva/sclera: Conjunctivae normal.  Cardiovascular:     Rate and Rhythm: Normal rate and regular rhythm.     Heart sounds: No murmur heard. Pulmonary:     Effort: Pulmonary effort is normal. No respiratory distress.     Breath sounds: Normal breath sounds.  Abdominal:     Palpations: Abdomen is soft.     Tenderness: There is no abdominal tenderness.  Musculoskeletal:        General: Swelling and tenderness present.     Cervical back: Neck supple.     Comments: Bruising over left shoulder  Skin:    General: Skin is warm and dry.     Capillary Refill: Capillary refill takes less than 2 seconds.  Neurological:     Mental Status: She is alert.  Psychiatric:        Mood and Affect: Mood normal.     ED Results and Treatments Labs (all labs ordered are listed, but  only abnormal results are displayed) Labs Reviewed  COMPREHENSIVE METABOLIC PANEL - Abnormal; Notable for the following components:      Result Value   Sodium 131 (*)    Glucose, Bld 106 (*)    All other components within normal limits  CBC WITH DIFFERENTIAL/PLATELET  URINALYSIS, ROUTINE W REFLEX MICROSCOPIC                                                                                                                           Radiology CT Cervical Spine Wo Contrast  Result Date: 12/04/2021 CLINICAL DATA:  Neck trauma (Age >= 65y), fall EXAM: CT CERVICAL SPINE WITHOUT CONTRAST TECHNIQUE: Multidetector CT imaging of the cervical spine was performed without intravenous contrast. Multiplanar CT image reconstructions were also generated. RADIATION DOSE REDUCTION: This exam was performed according to the departmental dose-optimization program which includes automated exposure control, adjustment of the mA and/or kV according to patient size and/or use of iterative reconstruction technique. COMPARISON:  09/30/2017 C-spine CT FINDINGS: Alignment: Normal cervical lordosis. No facet subluxation. Dens is well positioned between the lateral masses of C1. Skull base and vertebrae: No acute fracture. No primary bone lesion or focal pathologic process. Soft tissues and spinal canal: No prevertebral edema. No visible canal hematoma. Disc levels: Moderate to marked multilevel degenerative disc disease in the cervical spine, most prominent at C4-5. Moderate bilateral facet arthropathy. No significant degenerative foraminal stenosis. Upper chest: No acute abnormality. Other: Visualized mastoid air cells appear clear. No discrete thyroid nodules. No pathologically enlarged cervical nodes. IMPRESSION: 1. No cervical spine fracture or subluxation. 2. Moderate to marked multilevel degenerative changes in the cervical spine as described. 3. Please see the separate concurrent head CT report for information regarding the acute subarachnoid hemorrhage in the basilar cisterns. Electronically Signed   By: Ilona Sorrel M.D.   On: 12/04/2021 09:35   CT HEAD WO CONTRAST (5MM)  Result Date: 12/04/2021 CLINICAL DATA:  Head trauma, minor (Age >= 65y), fall EXAM: CT HEAD WITHOUT CONTRAST TECHNIQUE: Contiguous axial images were obtained from the base of the skull  through the vertex without intravenous contrast. RADIATION DOSE REDUCTION: This exam was performed according to the departmental dose-optimization program which includes automated exposure control, adjustment of the mA and/or kV according to patient size and/or use of iterative reconstruction technique. COMPARISON:  09/30/2017 head CT FINDINGS: Brain: Acute subarachnoid hemorrhage posterior to the midbrain bilaterally throughout the ambient cisterns. Minimal mass-effect on the midbrain. No evidence of parenchymal hemorrhage. No mass lesion or midline shift. No CT evidence of acute infarction. Generalized cerebral volume loss. Nonspecific marked subcortical and periventricular white matter hypodensity, most in keeping with chronic small vessel ischemic change. No ventriculomegaly. Vascular: No acute abnormality. Skull: No evidence of calvarial fracture. Moderate anterolateral left frontal scalp hematoma. Sinuses/Orbits: The visualized paranasal sinuses are essentially clear. Other:  The mastoid air cells are unopacified. IMPRESSION: 1. Acute subarachnoid hemorrhage posterior to the midbrain bilaterally throughout the ambient cisterns. Minimal mass-effect  on the midbrain. 2. Moderate anterolateral left frontal scalp hematoma. No evidence of calvarial fracture. 3. Generalized cerebral volume loss and marked chronic small vessel ischemic changes in the cerebral white matter. Critical Value/emergent results were called by telephone at the time of interpretation on 12/04/2021 aat 9:26 am to provider Annissa Andreoni [facility] , who verbally acknowledged these results. Electronically Signed   By: Ilona Sorrel M.D.   On: 12/04/2021 09:28   DG Pelvis Portable  Result Date: 12/04/2021 CLINICAL DATA:  Fall EXAM: PORTABLE PELVIS 1 VIEWS COMPARISON:  Radiograph dated August 17, 2017 FINDINGS: Osseous deformities of the bilateral inferior pubic rami likely sequela of prior fracture, new when compared with prior. Irregularity of the  superior right pubic ramus which is suspicious for nondisplaced acute or subacute fracture, new when compared to prior. Degenerative changes of the partially visualized lumbar spine. Prior left total hip replacement. IMPRESSION: Irregularity of the superior right pubic ramus concerning for acute or subacute fracture. Chronic appearing inferior pubic rami fractures. Electronically Signed   By: Yetta Glassman M.D.   On: 12/04/2021 09:07   DG Chest Portable 1 View  Result Date: 12/04/2021 CLINICAL DATA:  fall, r/o fx EXAM: PORTABLE CHEST 1 VIEW COMPARISON:  August 08, 2017. FINDINGS: Low lung volumes. Elevated left hemidiaphragm. Streaky left basilar opacities, probably atelectasis. No confluent consolidation. No visible pleural effusions or pneumothorax. No evidence of a displaced fracture. Limited visualization of the thoracic spine on this single AP radiograph with evidence of kyphoplasty. Polyarticular degenerative change. IMPRESSION: Elevated left hemidiaphragm with probable left basilar atelectasis. Otherwise, no evidence of acute cardiopulmonary disease. Electronically Signed   By: Margaretha Sheffield M.D.   On: 12/04/2021 09:00   DG Shoulder Left Portable  Result Date: 12/04/2021 CLINICAL DATA:  Unwitnessed fall today.  Shoulder pain. EXAM: LEFT SHOULDER COMPARISON:  12/20/2017 FINDINGS: Old healed proximal humeral fracture with mild angulation. No evidence of recurrent fracture in that region. Question if there is a fracture at the base of the glenoid. Consider scapular CT. IMPRESSION: Old healed fracture of the proximal humerus with mild angulation. Question acute fracture at the base of the glenoid.  Consider CT. Electronically Signed   By: Nelson Chimes M.D.   On: 12/04/2021 09:00    Pertinent labs & imaging results that were available during my care of the patient were reviewed by me and considered in my medical decision making (see MDM for details).  Medications Ordered in ED Medications -  No data to display                                                                                                                                   Procedures .Critical Care  Performed by: Teressa Lower, MD Authorized by: Teressa Lower, MD   Critical care provider statement:    Critical care time (minutes):  30   Critical care was time spent personally by me on the following activities:  Development of treatment plan with patient or surrogate, discussions with consultants, evaluation of patient's response to treatment, examination of patient, ordering and review of laboratory studies, ordering and review of radiographic studies, ordering and performing treatments and interventions, pulse oximetry, re-evaluation of patient's condition and review of old charts .Marland KitchenLaceration Repair  Date/Time: 12/04/2021 11:08 AM  Performed by: Teressa Lower, MD Authorized by: Teressa Lower, MD   Laceration details:    Location:  Face   Face location:  Forehead   Length (cm):  3 Pre-procedure details:    Preparation:  Patient was prepped and draped in usual sterile fashion Treatment:    Area cleansed with:  Saline   Amount of cleaning:  Standard   Irrigation method:  Pressure wash Skin repair:    Repair method:  Tissue adhesive Approximation:    Approximation:  Close Repair type:    Repair type:  Simple Post-procedure details:    Dressing:  Open (no dressing)   (including critical care time)  Medical Decision Making / ED Course   This patient presents to the ED for concern of fall, head injury, this involves an extensive number of treatment options, and is a complaint that carries with it a high risk of complications and morbidity.  The differential diagnosis includes fracture, ICH, SAH, SDH, ligamentous injury  MDM: Patient seen the emergency room for evaluation of a fall with a head injury.  Physical exam reveals hematoma with recent major laceration over the left forehead and  bruising and tenderness of the left shoulder.  Laboratory evaluation with mild hyponatremia 131 but is otherwise unremarkable.  Imaging including chest and pelvis x-ray unremarkable.  X-ray of the shoulder showing an old healed fracture of the proximal humerus with a possible acute fracture at the base of the glenoid.  As the patient will not be a current surgical candidate, we will not pursue additional imaging and instead place the patient in a sling.  CT C-spine unremarkable.  CT head and acute subarachnoid hemorrhage posterior to the midbrain with minimal mass effect on the midbrain.  Neurosurgery was consulted and I spoke with Dr. Kathyrn Sheriff who states that traditionally this would require a follow-up head CT but given that the patient would not be a candidate or even consent for neurosurgery, this should be treated palliatively and she should be discharged back to her facility.  I spoke extensively with the patient's PCP Dr. Lyndel Safe and the patient's son Jeneen Rinks who are both in agreement with this plan and understand that if this brain bleed worsens it may take her life.  We are all in agreement that the patient would be better served back at her facility in a place that is comfortable and we will not pursue any additional aggressive intervention or testing.  Laceration repaired with Dermabond.  Patient then discharged back to her facility.   Additional history obtained: -Additional history obtained from facility staff -External records from outside source obtained and reviewed including: Chart review including previous notes, labs, imaging, consultation notes   Lab Tests: -I ordered, reviewed, and interpreted labs.   The pertinent results include:   Labs Reviewed  COMPREHENSIVE METABOLIC PANEL - Abnormal; Notable for the following components:      Result Value   Sodium 131 (*)    Glucose, Bld 106 (*)    All other components within normal limits  CBC WITH DIFFERENTIAL/PLATELET  URINALYSIS, ROUTINE  W REFLEX MICROSCOPIC      Imaging Studies ordered: I ordered imaging studies including CT  head, CT C-spine, x-ray chest and pelvis, x-ray shoulder I independently visualized and interpreted imaging. I agree with the radiologist interpretation   Medicines ordered and prescription drug management: Meds ordered this encounter  Medications   DISCONTD: hydrALAZINE (APRESOLINE) injection 10 mg    -I have reviewed the patients home medicines and have made adjustments as needed  Critical interventions Neurosurgical consultation, multiple family members discussions, PCP consultation, diagnosis of traumatic brain bleed  Consultations Obtained: I requested consultation with the neurosurgeon,  and discussed lab and imaging findings as well as pertinent plan - they recommend: No additional imaging or interventions given patient's CODE STATUS and current medical issues   Cardiac Monitoring: The patient was maintained on a cardiac monitor.  I personally viewed and interpreted the cardiac monitored which showed an underlying rhythm of: NSR  Social Determinants of Health:  Factors impacting patients care include: Demented, living in facility   Reevaluation: After the interventions noted above, I reevaluated the patient and found that they have :stayed the same  Co morbidities that complicate the patient evaluation  Past Medical History:  Diagnosis Date   Anemia, iron deficiency    takes Ferrous Sulfate daily   Depression    takes Effexor daily   Gait disorder 04/21/2014   GERD (gastroesophageal reflux disease)    takes Omeprazole daily   Herpes ocular    history of-takes Acyclovir daily   High cholesterol    takes Atorvastatin daily   History of hiatal hernia    Hypertension    takes Metoprolol daily   Insomnia    takes Xanax nightly   Osteoarthritis of knee    bilateral   Sciatic pain       Dispostion: I considered admission for this patient, and given current comfort  care status, patient and family does not want hospital admission and we have agreed that the patient will be discharged     Final Clinical Impression(s) / ED Diagnoses Final diagnoses:  Fall, initial encounter  SAH (subarachnoid hemorrhage) ([place])  Closed fracture of glenoid cavity and neck of left scapula, initial encounter     '@PCDICTATION'$ @    Teressa Lower, MD 12/04/21 1117

## 2021-12-04 NOTE — Progress Notes (Signed)
Location: [place] of Service:  SNF (31)  Provider:   Code Status: DNR Goals of Care:     11/13/2021    1:41 PM  Advanced Directives  Does Patient Have a Medical Advance Directive? Yes  Type of Paramedic of [place];Out of facility DNR (pink MOST or yellow form)  Does patient want to make changes to medical advance directive? No - Patient declined  Copy of [place] in Chart? Yes - validated most recent copy scanned in chart (See row information)  Pre-existing out of facility DNR order (yellow form or pink MOST form) Pink MOST form placed in chart (order not valid for inpatient use)     Chief Complaint  Patient presents with   Acute Visit    HPI: Patient is a 88 y.o. female seen today for an acute visit for Change in Mental status after her fall  Patient has a history of Hypertension, anemia, depression, Hyperlipidemia , COPD, hyponatremia,Depression with Anxiety and Dementia   Long term resident of SNF  She fell this morning and was found by staff with her head down Send to the hospital CT scan of head showed  Acute SAH with minimal mass effect Anterolateral Frontotemporal Hematoma Xray of Humerus showed possible scute fracture of Glenoid Left  Xray of pelvis showed possible Right Pubic Fracture  Patient also had laceration in her left Forehead which was repaired  Patient also spiked Temp of 102.5 CBC did not show any white count POA did not want anything aggressive. Neurosurgery also said conservative management  And after discussion with ER physician she is now back to facility as comfort care  Unable to give any history. Dow[place] Not responding has her eyes close and moans Past Medical History:  Diagnosis Date   Anemia, iron deficiency    takes Ferrous Sulfate daily   Depression    takes Effexor daily   Gait disorder 04/21/2014   GERD (gastroesophageal reflux disease)    takes Omeprazole daily    Herpes ocular    history of-takes Acyclovir daily   High cholesterol    takes Atorvastatin daily   History of hiatal hernia    Hypertension    takes Metoprolol daily   Insomnia    takes Xanax nightly   Osteoarthritis of knee    bilateral   Sciatic pain     Past Surgical History:  Procedure Laterality Date   ABDOMINAL HYSTERECTOMY  1995   partial   CARPAL TUNNEL RELEASE Right 07/21/2007   CARPAL TUNNEL RELEASE Left 09/24/2007   COLONOSCOPY     DESCEMETS STRIPPING AUTOMATED ENDOTHELIAL KERATOPLASTY Left 03/14/2011   DESCEMETS STRIPPING AUTOMATED ENDOTHELIAL KERATOPLASTY Right 08/20/2012   EYE SURGERY Bilateral    cataract surgery   EYE SURGERY Left    corneal transplant   HERNIA REPAIR     HIP ARTHROPLASTY Left 06/13/2013   Procedure: ARTHROPLASTY BIPOLAR HIP; Injection left shoulder;  Surgeon: Johnny Bridge, MD;  Location: [place];  Service: Orthopedics;  Laterality: Left;   JOINT REPLACEMENT     bilateral knees, left knee   KNEE ARTHROSCOPY Right 05/11/2001   KYPHOPLASTY N/A 04/10/2015   Procedure: T11 Kyphoplasty;  Surgeon: Jovita Gamma, MD;  Location: [place] [facility];  Service: Neurosurgery;  Laterality: N/A;  T11 Kyphoplasty   KYPHOPLASTY N/A 05/06/2015   Procedure: KYPHOPLASTY Thoracic twelve;  Surgeon: Jovita Gamma, MD;  Location: [place] [facility];  Service: Neurosurgery;  Laterality: N/A;  T12 Kyphoplasty  LAPAROSCOPIC NISSEN FUNDOPLICATION  10/27/3974   LUMBAR LAMINECTOMY/DECOMPRESSION MICRODISCECTOMY  08/29/2010   L2-S1   SHOULDER ARTHROSCOPY WITH ROTATOR CUFF REPAIR AND SUBACROMIAL DECOMPRESSION Right 01/01/2000   TONSILLECTOMY     as child   TOTAL HIP REVISION Left 07/08/2014   Procedure: TOTAL HIP REVISION;  Surgeon: Kerin Salen, MD;  Location: [place];  Service: Orthopedics;  Laterality: Left;   TOTAL KNEE ARTHROPLASTY Left 05/14/2004   TOTAL KNEE ARTHROPLASTY  12/23/2011   Procedure: TOTAL KNEE ARTHROPLASTY;  Surgeon: Lorn Junes, MD;  Location: [place];  Service:  Orthopedics;  Laterality: Right;  DR Noemi Chapel WANTS 90 MINUTES FOR THIS CASE   TRIGGER FINGER RELEASE Right 07/21/2007   thumb   TRIGGER FINGER RELEASE Left 09/24/2007   middle finger   TRIGGER FINGER RELEASE Right 03/10/2008   ring and little fingers   TRIGGER FINGER RELEASE Right 09/02/2012   Procedure: RELEASE TRIGGER FINGER/A-1 PULLEY RIGHT INDEX FINGER;  Surgeon: Wynonia Sours, MD;  Location: [place];  Service: Orthopedics;  Laterality: Right;   TRIGGER FINGER RELEASE Left 12/22/2012   Procedure: RELEASE TRIGGER FINGER/A-1 PULLEY LEFT RING FINGER;  Surgeon: Wynonia Sours, MD;  Location: [place];  Service: Orthopedics;  Laterality: Left;  Left     Allergies  Allergen Reactions   Morphine And Related Other (See Comments)    Agitation Disorientation Mood changes Strange dreams   Transderm-Scop [Scopolamine] Other (See Comments)    Mental changes   Lipitor [Atorvastatin] Other (See Comments)    unknown   Nsaids Other (See Comments)    unknown   Pravachol [Pravastatin Sodium] Other (See Comments)    unknown   Teriparatide Other (See Comments)    unknown    Outpatient Encounter Medications as of 12/04/2021  Medication Sig   LORazepam (ATIVAN) 0.5 MG tablet Take 1 tablet (0.5 mg total) by mouth every 4 (four) hours as needed for anxiety.   morphine (ROXANOL) 20 MG/ML concentrated solution Take 0.25 mLs (5 mg total) by mouth every 2 (two) hours as needed for severe pain.   acetaminophen (TYLENOL) 500 MG tablet Take 500 mg by mouth at bedtime.   carbamide peroxide (DEBROX) 6.5 % OTIC solution 5 drops at bedtime.   cholecalciferol (VITAMIN D3) 25 MCG (1000 UNIT) tablet Take 1,000 Units by mouth daily.    Glycerin-Hypromellose-PEG 400 (ARTIFICIAL TEARS) 0.2-0.2-1 % SOLN Place 1 drop into both eyes 4 (four) times daily.   magnesium hydroxide (MILK OF MAGNESIA) 400 MG/5ML suspension Take 15 mLs by mouth at bedtime as needed for mild constipation.     metoprolol tartrate (LOPRESSOR) 25 MG tablet Take 25 mg by mouth 2 (two) times daily.   mirtazapine (REMERON) 7.5 MG tablet Take 7.5 mg by mouth at bedtime.   nitroGLYCERIN (NITROSTAT) 0.4 MG SL tablet Place 0.4 mg under the tongue every 5 (five) minutes as needed for chest pain.   omeprazole (PRILOSEC) 10 MG capsule Take 10 mg by mouth daily.   venlafaxine XR (EFFEXOR-XR) 75 MG 24 hr capsule Take 75 mg by mouth daily with breakfast.   White Petrolatum-Mineral Oil (SYSTANE NIGHTTIME) OINT Apply to both eyes at bedtime   [DISCONTINUED] aspirin EC 81 MG tablet Take 81 mg by mouth daily.    [DISCONTINUED] LORazepam (ATIVAN) 0.5 MG tablet Take 0.5 mg by mouth daily as needed for anxiety.   [DISCONTINUED] LORazepam (ATIVAN) 0.5 MG tablet Take 1 tablet (0.5 mg total) by mouth daily as needed for anxiety.   [  DISCONTINUED] memantine (NAMENDA) 10 MG tablet Take 10 mg by mouth 2 (two) times daily.    No facility-administered encounter medications on file as of 12/04/2021.    Review of Systems:  Review of Systems  Unable to perform ROS: Other    Health Maintenance  Topic Date Due   Zoster Vaccines- Shingrix (1 of 2) Never done   COVID-19 Vaccine (5 - Moderna series) 07/13/2021   INFLUENZA VACCINE  01/22/2022   TETANUS/TDAP  09/26/2027   Pneumonia Vaccine 46+ Years old  Completed   DEXA SCAN  Completed   HPV VACCINES  Aged Out    Physical Exam: There were no vitals filed for this visit. There is no height or weight on file to calculate BMI. Physical Exam Vitals reviewed.  Constitutional:      Comments: Somnolent  HENT:     Head: Normocephalic.     Comments: Hematoma with Laceration in left [place]    Nose: Nose normal.     Mouth/Throat:     Mouth: Mucous membranes are moist.     Pharynx: Oropharynx is clear.  Eyes:     Pupils: Pupils are equal, round, and reactive to light.  Cardiovascular:     Rate and Rhythm: Normal rate and regular rhythm.     Pulses: Normal pulses.     Heart  sounds: Normal heart sounds. No murmur heard. Pulmonary:     Effort: Pulmonary effort is normal.     Breath sounds: Normal breath sounds.  Abdominal:     General: Abdomen is flat. Bowel sounds are normal.     Palpations: Abdomen is soft.  Musculoskeletal:        General: No swelling.     Cervical back: Neck supple.  Skin:    General: Skin is warm.  Neurological:     Comments: Is not following any commands  Psychiatric:        Mood and Affect: Mood normal.        Thought Content: Thought content normal.     Labs reviewed: Basic Metabolic Panel: Recent Labs    06/08/21 0000 10/30/21 0000 12/04/21 0931  NA 134* 134* 131*  K 4.5 4.2 3.9  CL 100 101 98  CO2 27* 24* 25  GLUCOSE  --   --  106*  BUN '12 13 12  '$ CREATININE 0.6 0.8 0.75  CALCIUM 8.6* 8.5* 9.1   Liver Function Tests: Recent Labs    06/08/21 0000 12/04/21 0931  AST  --  28  ALT  --  22  ALKPHOS  --  88  BILITOT  --  0.8  PROT  --  6.7  ALBUMIN 3.2* 3.6   No results for input(s): "LIPASE", "AMYLASE" in the last 8760 hours. No results for input(s): "AMMONIA" in the last 8760 hours. CBC: Recent Labs    06/08/21 0000 10/30/21 0000 12/04/21 0931  WBC 5.4 5.7 6.4  NEUTROABS  --  1,208.00 4.5  HGB 11.3* 11.3* 12.9  HCT 33* 34* 37.9  MCV  --   --  92.9  PLT 220 229 218   Lipid Panel: No results for input(s): "CHOL", "HDL", "LDLCALC", "TRIG", "CHOLHDL", "LDLDIRECT" in the last 8760 hours. Lab Results  Component Value Date   HGBA1C 5.8 06/29/2013    Procedures since last visit: CT Cervical Spine Wo Contrast  Result Date: 12/04/2021 CLINICAL DATA:  Neck trauma (Age >= 65y), fall EXAM: CT CERVICAL SPINE WITHOUT CONTRAST TECHNIQUE: Multidetector CT imaging of the cervical spine was performed without  intravenous contrast. Multiplanar CT image reconstructions were also generated. RADIATION DOSE REDUCTION: This exam was performed according to the departmental dose-optimization program which includes  automated exposure control, adjustment of the mA and/or kV according to patient size and/or use of iterative reconstruction technique. COMPARISON:  09/30/2017 C-spine CT FINDINGS: Alignment: Normal cervical lordosis. No facet subluxation. Dens is well positioned between the lateral masses of C1. Skull base and vertebrae: No acute fracture. No primary bone lesion or focal pathologic process. Soft tissues and spinal canal: No prevertebral edema. No visible canal hematoma. Disc levels: Moderate to marked multilevel degenerative disc disease in the cervical spine, most prominent at C4-5. Moderate bilateral facet arthropathy. No significant degenerative foraminal stenosis. Upper chest: No acute abnormality. Other: Visualized mastoid air cells appear clear. No discrete thyroid nodules. No pathologically enlarged cervical nodes. IMPRESSION: 1. No cervical spine fracture or subluxation. 2. Moderate to marked multilevel degenerative changes in the cervical spine as described. 3. Please see the separate concurrent head CT report for information regarding the acute subarachnoid hemorrhage in the basilar cisterns. Electronically Signed   By: Ilona Sorrel M.D.   On: 12/04/2021 09:35   CT HEAD WO CONTRAST (5MM)  Result Date: 12/04/2021 CLINICAL DATA:  Head trauma, minor (Age >= 65y), fall EXAM: CT HEAD WITHOUT CONTRAST TECHNIQUE: Contiguous axial images were obtained from the base of the skull through the vertex without intravenous contrast. RADIATION DOSE REDUCTION: This exam was performed according to the departmental dose-optimization program which includes automated exposure control, adjustment of the mA and/or kV according to patient size and/or use of iterative reconstruction technique. COMPARISON:  09/30/2017 head CT FINDINGS: Brain: Acute subarachnoid hemorrhage posterior to the midbrain bilaterally throughout the ambient cisterns. Minimal mass-effect on the midbrain. No evidence of parenchymal hemorrhage. No mass  lesion or midline shift. No CT evidence of acute infarction. Generalized cerebral volume loss. Nonspecific marked subcortical and periventricular white matter hypodensity, most in keeping with chronic small vessel ischemic change. No ventriculomegaly. Vascular: No acute abnormality. Skull: No evidence of calvarial fracture. Moderate anterolateral left frontal scalp hematoma. Sinuses/Orbits: The visualized paranasal sinuses are essentially clear. Other:  The mastoid air cells are unopacified. IMPRESSION: 1. Acute subarachnoid hemorrhage posterior to the midbrain bilaterally throughout the ambient cisterns. Minimal mass-effect on the midbrain. 2. Moderate anterolateral left frontal scalp hematoma. No evidence of calvarial fracture. 3. Generalized cerebral volume loss and marked chronic small vessel ischemic changes in the cerebral white matter. Critical Value/emergent results were called by telephone at the time of interpretation on 12/04/2021 aat 9:26 am to provider [facility] , who verbally acknowledged these results. Electronically Signed   By: Ilona Sorrel M.D.   On: 12/04/2021 09:28   DG Pelvis Portable  Result Date: 12/04/2021 CLINICAL DATA:  Fall EXAM: PORTABLE PELVIS 1 VIEWS COMPARISON:  Radiograph dated August 17, 2017 FINDINGS: Osseous deformities of the bilateral inferior pubic rami likely sequela of prior fracture, new when compared with prior. Irregularity of the superior right pubic ramus which is suspicious for nondisplaced acute or subacute fracture, new when compared to prior. Degenerative changes of the partially visualized lumbar spine. Prior left total hip replacement. IMPRESSION: Irregularity of the superior right pubic ramus concerning for acute or subacute fracture. Chronic appearing inferior pubic rami fractures. Electronically Signed   By: Yetta Glassman M.D.   On: 12/04/2021 09:07   DG Chest Portable 1 View  Result Date: 12/04/2021 CLINICAL DATA:  fall, r/o fx EXAM: PORTABLE  CHEST 1 VIEW COMPARISON:  August 08, 2017.  FINDINGS: Low lung volumes. Elevated left hemidiaphragm. Streaky left basilar opacities, probably atelectasis. No confluent consolidation. No visible pleural effusions or pneumothorax. No evidence of a displaced fracture. Limited visualization of the thoracic spine on this single AP radiograph with evidence of kyphoplasty. Polyarticular degenerative change. IMPRESSION: Elevated left hemidiaphragm with probable left basilar atelectasis. Otherwise, no evidence of acute cardiopulmonary disease. Electronically Signed   By: Margaretha Sheffield M.D.   On: 12/04/2021 09:00   DG Shoulder Left Portable  Result Date: 12/04/2021 CLINICAL DATA:  Unwitnessed fall today.  Shoulder pain. EXAM: LEFT SHOULDER COMPARISON:  12/20/2017 FINDINGS: Old healed proximal humeral fracture with mild angulation. No evidence of recurrent fracture in that region. Question if there is a fracture at the base of the glenoid. Consider scapular CT. IMPRESSION: Old healed fracture of the proximal humerus with mild angulation. Question acute fracture at the base of the glenoid.  Consider CT. Electronically Signed   By: Nelson Chimes M.D.   On: 12/04/2021 09:00    Assessment/Plan 1. SAH (subarachnoid hemorrhage) [facility]) Per Neurosurgery Conservative management Discontinue Aspirin Comfort care Hold PO meds till she gets more awake Hospice Consult Written Also start on Roxanol and ativan  2. Fall, subsequent encounter   3. Fever, unspecified fever cause Tylenol PRN UA from Hospital pending  4. Somnolence Discontinue Remeron also Just Comfort care Hold her Other PO meds  5. Severe vascular dementia with other behavioral disturbance [facility]) Hospice referal made POA wants her to be comfortable    Labs/tests ordered:  * No order type specified * Next appt:  Visit date not found Total time spent in this patient care encounter was  45_  minutes; greater than 50% of the visit spent  counseling pstaff, reviewing records , Labs and coordinating care for problems addressed at this encounter.

## 2021-12-12 ENCOUNTER — Non-Acute Institutional Stay (SKILLED_NURSING_FACILITY): Payer: Medicare Other | Admitting: Adult Health

## 2021-12-12 ENCOUNTER — Encounter: Payer: Self-pay | Admitting: Adult Health

## 2021-12-12 DIAGNOSIS — I609 Nontraumatic subarachnoid hemorrhage, unspecified: Secondary | ICD-10-CM | POA: Diagnosis not present

## 2021-12-12 DIAGNOSIS — R451 Restlessness and agitation: Secondary | ICD-10-CM | POA: Diagnosis not present

## 2021-12-12 NOTE — Progress Notes (Unsigned)
Location:  [facility] Room Number: N042/B Place of Service:  SNF (31) Provider:  Durenda Age, DNP, FNP-BC  Patient Care Team: [name], MD as PCP - General (Internal Medicine) [name], MD as Referring Physician (Ophthalmology) Mast, Man X, NP as Nurse Practitioner (Internal Medicine)  Extended Emergency Contact Information Primary Emergency Contact: [name] Address: [address]          [place], [place] [address] [name] of [place] Phone: [phone] Mobile Phone: [phone] Relation: Son Secondary Emergency Contact: [name] Address: [address]          [place], [place] [address] [name] of [place] Phone: [phone] Mobile Phone: [phone] Relation: Son  Code Status:  DNR  Goals of care: Advanced Directive information    12/12/2021    3:35 PM  Advanced Directives  Does Patient Have a Medical Advance Directive? Yes  Type of Paramedic of [place];Out of facility DNR (pink MOST or yellow form)  Does patient want to make changes to medical advance directive? No - Patient declined  Copy of [place] in Chart? Yes - validated most recent copy scanned in chart (See row information)  Pre-existing out of facility DNR order (yellow form or pink MOST form) Pink MOST form placed in chart (order not valid for inpatient use)     Chief Complaint  Patient presents with   Acute Visit    Agitation.     HPI:  Pt is a 88 y.o. female seen today for medical management of chronic diseases.  ***   Past Medical History:  Diagnosis Date   Anemia, iron deficiency    takes Ferrous Sulfate daily   Depression    takes Effexor daily   Gait disorder 04/21/2014   GERD (gastroesophageal reflux disease)    takes Omeprazole daily   Herpes ocular    history of-takes Acyclovir daily   High cholesterol    takes Atorvastatin daily   History of hiatal  hernia    Hypertension    takes Metoprolol daily   Insomnia    takes Xanax nightly   Osteoarthritis of knee    bilateral   Sciatic pain    Past Surgical History:  Procedure Laterality Date   ABDOMINAL HYSTERECTOMY  1995   partial   CARPAL TUNNEL RELEASE Right 07/21/2007   CARPAL TUNNEL RELEASE Left 09/24/2007   COLONOSCOPY     DESCEMETS STRIPPING AUTOMATED ENDOTHELIAL KERATOPLASTY Left 03/14/2011   DESCEMETS STRIPPING AUTOMATED ENDOTHELIAL KERATOPLASTY Right 08/20/2012   EYE SURGERY Bilateral    cataract surgery   EYE SURGERY Left    corneal transplant   HERNIA REPAIR     HIP ARTHROPLASTY Left 06/13/2013   Procedure: ARTHROPLASTY BIPOLAR HIP; Injection left shoulder;  Surgeon: Johnny Bridge, MD;  Location: [facility];  Service: Orthopedics;  Laterality: Left;   JOINT REPLACEMENT     bilateral knees, left knee   KNEE ARTHROSCOPY Right 05/11/2001   KYPHOPLASTY N/A 04/10/2015   Procedure: T11 Kyphoplasty;  Surgeon: Jovita Gamma, MD;  Location: [facility];  Service: Neurosurgery;  Laterality: N/A;  T11 Kyphoplasty   KYPHOPLASTY N/A 05/06/2015   Procedure: KYPHOPLASTY Thoracic twelve;  Surgeon: Jovita Gamma, MD;  Location: [facility];  Service: Neurosurgery;  Laterality: N/A;  T12 Kyphoplasty   LAPAROSCOPIC NISSEN FUNDOPLICATION  9/35/7017   LUMBAR LAMINECTOMY/DECOMPRESSION MICRODISCECTOMY  08/29/2010   L2-S1   SHOULDER ARTHROSCOPY WITH ROTATOR CUFF REPAIR AND SUBACROMIAL DECOMPRESSION Right 01/01/2000  TONSILLECTOMY     as child   TOTAL HIP REVISION Left 07/08/2014   Procedure: TOTAL HIP REVISION;  Surgeon: Kerin Salen, MD;  Location: [facility];  Service: Orthopedics;  Laterality: Left;   TOTAL KNEE ARTHROPLASTY Left 05/14/2004   TOTAL KNEE ARTHROPLASTY  12/23/2011   Procedure: TOTAL KNEE ARTHROPLASTY;  Surgeon: Lorn Junes, MD;  Location: [facility];  Service: Orthopedics;  Laterality: Right;  DR Noemi Chapel WANTS 90 MINUTES FOR THIS CASE   TRIGGER FINGER RELEASE Right 07/21/2007   thumb    TRIGGER FINGER RELEASE Left 09/24/2007   middle finger   TRIGGER FINGER RELEASE Right 03/10/2008   ring and little fingers   TRIGGER FINGER RELEASE Right 09/02/2012   Procedure: RELEASE TRIGGER FINGER/A-1 PULLEY RIGHT INDEX FINGER;  Surgeon: Wynonia Sours, MD;  Location: [facility];  Service: Orthopedics;  Laterality: Right;   TRIGGER FINGER RELEASE Left 12/22/2012   Procedure: RELEASE TRIGGER FINGER/A-1 PULLEY LEFT RING FINGER;  Surgeon: Wynonia Sours, MD;  Location: [facility];  Service: Orthopedics;  Laterality: Left;  Left     Allergies  Allergen Reactions   Morphine And Related Other (See Comments)    Agitation Disorientation Mood changes Strange dreams   Transderm-Scop [Scopolamine] Other (See Comments)    Mental changes   Lipitor [Atorvastatin] Other (See Comments)    unknown   Nsaids Other (See Comments)    unknown   Pravachol [Pravastatin Sodium] Other (See Comments)    unknown   Teriparatide Other (See Comments)    unknown    Outpatient Encounter Medications as of 12/12/2021  Medication Sig   acetaminophen (TYLENOL) 500 MG tablet Take 500 mg by mouth every 6 (six) hours as needed for fever.   Glycerin-Hypromellose-PEG 400 (ARTIFICIAL TEARS) 0.2-0.2-1 % SOLN Place 1 drop into both eyes 4 (four) times daily.   haloperidol (HALDOL) 0.5 MG tablet Take 0.5 mg by mouth every 4 (four) hours. May melt tablet   morphine 20 MG/5ML solution Take 5 mg by mouth every 4 (four) hours as needed (SOB/pain).   nitroGLYCERIN (NITROSTAT) 0.4 MG SL tablet Place 0.4 mg under the tongue every 5 (five) minutes as needed for chest pain.   White Petrolatum-Mineral Oil (SYSTANE NIGHTTIME) OINT Apply to both eyes at bedtime   [DISCONTINUED] carbamide peroxide (DEBROX) 6.5 % OTIC solution 5 drops at bedtime.   [DISCONTINUED] cholecalciferol (VITAMIN D3) 25 MCG (1000 UNIT) tablet Take 1,000 Units by mouth daily.    [DISCONTINUED] LORazepam (ATIVAN) 0.5 MG tablet Take 1  tablet (0.5 mg total) by mouth every 4 (four) hours as needed for anxiety.   [DISCONTINUED] magnesium hydroxide (MILK OF MAGNESIA) 400 MG/5ML suspension Take 15 mLs by mouth at bedtime as needed for mild constipation.    [DISCONTINUED] metoprolol tartrate (LOPRESSOR) 25 MG tablet Take 25 mg by mouth 2 (two) times daily.   [DISCONTINUED] morphine (ROXANOL) 20 MG/ML concentrated solution Take 0.25 mLs (5 mg total) by mouth every 2 (two) hours as needed for severe pain.   [DISCONTINUED] omeprazole (PRILOSEC) 10 MG capsule Take 10 mg by mouth daily.   [DISCONTINUED] venlafaxine XR (EFFEXOR-XR) 75 MG 24 hr capsule Take 75 mg by mouth daily with breakfast.   No facility-administered encounter medications on file as of 12/12/2021.    Review of Systems ***    Immunization History  Administered Date(s) Administered   Influenza Whole 03/27/2018   Influenza, High Dose Seasonal PF 03/26/2019   Influenza-Unspecified 04/14/2017, 04/05/2020, 04/11/2021   Moderna  SARS-COV2 Booster Vaccination 11/21/2020   Moderna Sars-Covid-2 Vaccination 07/24/2019, 08/21/2019, 05/02/2020   Pfizer Covid-19 Vaccine Bivalent Booster 68yr & up 03/13/2021   Pneumococcal Conjugate-13 09/24/2017   Pneumococcal Polysaccharide-23 06/14/2013   Td 09/25/2017   Varicella 06/29/2003   Pertinent  Health Maintenance Due  Topic Date Due   INFLUENZA VACCINE  01/22/2022   DEXA SCAN  Completed      09/19/2017    1:53 PM 12/04/2021    8:25 AM  [place]in the past year? No   Patient Fall Risk Level  High fall risk     Vitals:   12/12/21 1526  BP: 126/75  Pulse: 62  Resp: 20  Temp: 97.7 F (36.5 C)  SpO2: 95%  Weight: 108 lb 12.8 oz (49.4 kg)  Height: '5\' 1"'$  (1.549 m)   Body mass index is 20.56 kg/m.  Physical Exam     Labs reviewed: Recent Labs    06/08/21 0000 10/30/21 0000 12/04/21 0931  NA 134* 134* 131*  K 4.5 4.2 3.9  CL 100 101 98  CO2 27* 24* 25  GLUCOSE  --   --  106*  BUN '12 13 12   '$ CREATININE 0.6 0.8 0.75  CALCIUM 8.6* 8.5* 9.1   Recent Labs    06/08/21 0000 12/04/21 0931  AST  --  28  ALT  --  22  ALKPHOS  --  88  BILITOT  --  0.8  PROT  --  6.7  ALBUMIN 3.2* 3.6   Recent Labs    06/08/21 0000 10/30/21 0000 12/04/21 0931  WBC 5.4 5.7 6.4  NEUTROABS  --  1,208.00 4.5  HGB 11.3* 11.3* 12.9  HCT 33* 34* 37.9  MCV  --   --  92.9  PLT 220 229 218   Lab Results  Component Value Date   TSH 2.65 09/14/2020   Lab Results  Component Value Date   HGBA1C 5.8 06/29/2013   No results found for: "CHOL", "HDL", "LDLCALC", "LDLDIRECT", "TRIG", "CHOLHDL"  Significant Diagnostic Results in last 30 days:  CT Cervical Spine Wo Contrast  Result Date: 12/04/2021 CLINICAL DATA:  Neck trauma (Age >= 65y), fall EXAM: CT CERVICAL SPINE WITHOUT CONTRAST TECHNIQUE: Multidetector CT imaging of the cervical spine was performed without intravenous contrast. Multiplanar CT image reconstructions were also generated. RADIATION DOSE REDUCTION: This exam was performed according to the departmental dose-optimization program which includes automated exposure control, adjustment of the mA and/or kV according to patient size and/or use of iterative reconstruction technique. COMPARISON:  09/30/2017 C-spine CT FINDINGS: Alignment: Normal cervical lordosis. No facet subluxation. Dens is well positioned between the lateral masses of C1. Skull base and vertebrae: No acute fracture. No primary bone lesion or focal pathologic process. Soft tissues and spinal canal: No prevertebral edema. No visible canal hematoma. Disc levels: Moderate to marked multilevel degenerative disc disease in the cervical spine, most prominent at C4-5. Moderate bilateral facet arthropathy. No significant degenerative foraminal stenosis. Upper chest: No acute abnormality. Other: Visualized mastoid air cells appear clear. No discrete thyroid nodules. No pathologically enlarged cervical nodes. IMPRESSION: 1. No cervical spine  fracture or subluxation. 2. Moderate to marked multilevel degenerative changes in the cervical spine as described. 3. Please see the separate concurrent head CT report for information regarding the acute subarachnoid hemorrhage in the basilar cisterns. Electronically Signed   By: JIlona SorrelM.D.   On: 12/04/2021 09:35   CT HEAD WO CONTRAST (5MM)  Result Date: 12/04/2021 CLINICAL DATA:  Head trauma, minor (Age >= 65y), fall EXAM: CT HEAD WITHOUT CONTRAST TECHNIQUE: Contiguous axial images were obtained from the base of the skull through the vertex without intravenous contrast. RADIATION DOSE REDUCTION: This exam was performed according to the departmental dose-optimization program which includes automated exposure control, adjustment of the mA and/or kV according to patient size and/or use of iterative reconstruction technique. COMPARISON:  09/30/2017 head CT FINDINGS: Brain: Acute subarachnoid hemorrhage posterior to the midbrain bilaterally throughout the ambient cisterns. Minimal mass-effect on the midbrain. No evidence of parenchymal hemorrhage. No mass lesion or midline shift. No CT evidence of acute infarction. Generalized cerebral volume loss. Nonspecific marked subcortical and periventricular white matter hypodensity, most in keeping with chronic small vessel ischemic change. No ventriculomegaly. Vascular: No acute abnormality. Skull: No evidence of calvarial fracture. Moderate anterolateral left frontal scalp hematoma. Sinuses/Orbits: The visualized paranasal sinuses are essentially clear. Other:  The mastoid air cells are unopacified. IMPRESSION: 1. Acute subarachnoid hemorrhage posterior to the midbrain bilaterally throughout the ambient cisterns. Minimal mass-effect on the midbrain. 2. Moderate anterolateral left frontal scalp hematoma. No evidence of calvarial fracture. 3. Generalized cerebral volume loss and marked chronic small vessel ischemic changes in the cerebral white matter. Critical  Value/emergent results were called by telephone at the time of interpretation on 12/04/2021 aat 9:26 am to provider [facility] , who verbally acknowledged these results. Electronically Signed   By: Ilona Sorrel M.D.   On: 12/04/2021 09:28   DG Pelvis Portable  Result Date: 12/04/2021 CLINICAL DATA:  Fall EXAM: PORTABLE PELVIS 1 VIEWS COMPARISON:  Radiograph dated August 17, 2017 FINDINGS: Osseous deformities of the bilateral inferior pubic rami likely sequela of prior fracture, new when compared with prior. Irregularity of the superior right pubic ramus which is suspicious for nondisplaced acute or subacute fracture, new when compared to prior. Degenerative changes of the partially visualized lumbar spine. Prior left total hip replacement. IMPRESSION: Irregularity of the superior right pubic ramus concerning for acute or subacute fracture. Chronic appearing inferior pubic rami fractures. Electronically Signed   By: Yetta Glassman M.D.   On: 12/04/2021 09:07   DG Chest Portable 1 View  Result Date: 12/04/2021 CLINICAL DATA:  fall, r/o fx EXAM: PORTABLE CHEST 1 VIEW COMPARISON:  August 08, 2017. FINDINGS: Low lung volumes. Elevated left hemidiaphragm. Streaky left basilar opacities, probably atelectasis. No confluent consolidation. No visible pleural effusions or pneumothorax. No evidence of a displaced fracture. Limited visualization of the thoracic spine on this single AP radiograph with evidence of kyphoplasty. Polyarticular degenerative change. IMPRESSION: Elevated left hemidiaphragm with probable left basilar atelectasis. Otherwise, no evidence of acute cardiopulmonary disease. Electronically Signed   By: Margaretha Sheffield M.D.   On: 12/04/2021 09:00   DG Shoulder Left Portable  Result Date: 12/04/2021 CLINICAL DATA:  Unwitnessed fall today.  Shoulder pain. EXAM: LEFT SHOULDER COMPARISON:  12/20/2017 FINDINGS: Old healed proximal humeral fracture with mild angulation. No evidence of recurrent  fracture in that region. Question if there is a fracture at the base of the glenoid. Consider scapular CT. IMPRESSION: Old healed fracture of the proximal humerus with mild angulation. Question acute fracture at the base of the glenoid.  Consider CT. Electronically Signed   By: Nelson Chimes M.D.   On: 12/04/2021 09:00    Assessment/Plan ***   Family/ staff Communication: Discussed plan of care with resident and charge nurse  Labs/tests ordered:     Durenda Age, DNP, MSN, FNP-BC [facility] [phone] (Monday-Friday 8:00  a.m. - 5:00 p.m.) [phone] (after hours)

## 2021-12-22 DEATH — deceased
